# Patient Record
Sex: Male | Born: 1950
Health system: Southern US, Community
[De-identification: ages and names within clinical notes are randomized; demographics above are authoritative.]

## PROBLEM LIST (undated history)

## (undated) DIAGNOSIS — R51 Headache: Secondary | ICD-10-CM

## (undated) DIAGNOSIS — M1990 Unspecified osteoarthritis, unspecified site: Secondary | ICD-10-CM

## (undated) DIAGNOSIS — F1010 Alcohol abuse, uncomplicated: Secondary | ICD-10-CM

## (undated) DIAGNOSIS — F329 Major depressive disorder, single episode, unspecified: Secondary | ICD-10-CM

## (undated) DIAGNOSIS — I2510 Atherosclerotic heart disease of native coronary artery without angina pectoris: Secondary | ICD-10-CM

## (undated) DIAGNOSIS — Z86718 Personal history of other venous thrombosis and embolism: Secondary | ICD-10-CM

## (undated) DIAGNOSIS — I10 Essential (primary) hypertension: Secondary | ICD-10-CM

## (undated) DIAGNOSIS — I739 Peripheral vascular disease, unspecified: Secondary | ICD-10-CM

## (undated) DIAGNOSIS — H547 Unspecified visual loss: Secondary | ICD-10-CM

## (undated) DIAGNOSIS — Z952 Presence of prosthetic heart valve: Secondary | ICD-10-CM

## (undated) DIAGNOSIS — T826XXA Infection and inflammatory reaction due to cardiac valve prosthesis, initial encounter: Secondary | ICD-10-CM

## (undated) DIAGNOSIS — I729 Aneurysm of unspecified site: Secondary | ICD-10-CM

## (undated) DIAGNOSIS — I743 Embolism and thrombosis of arteries of the lower extremities: Secondary | ICD-10-CM

## (undated) DIAGNOSIS — I38 Endocarditis, valve unspecified: Secondary | ICD-10-CM

## (undated) DIAGNOSIS — Z8719 Personal history of other diseases of the digestive system: Secondary | ICD-10-CM

## (undated) HISTORY — DX: Infection and inflammatory reaction due to cardiac valve prosthesis, initial encounter: T82.6XXA

## (undated) HISTORY — PX: CARDIAC CATHETERIZATION: SHX172

## (undated) HISTORY — DX: Essential (primary) hypertension: I10

## (undated) HISTORY — DX: Aneurysm of unspecified site: I72.9

## (undated) HISTORY — DX: Endocarditis, valve unspecified: I38

## (undated) HISTORY — DX: Personal history of other venous thrombosis and embolism: Z86.718

## (undated) HISTORY — DX: Unspecified visual loss: H54.7

## (undated) HISTORY — DX: Alcohol abuse, uncomplicated: F10.10

## (undated) HISTORY — DX: Presence of prosthetic heart valve: Z95.2

## (undated) HISTORY — DX: Personal history of other diseases of the digestive system: Z87.19

---
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
## 2003-09-22 HISTORY — PX: OTHER SURGICAL HISTORY: SHX169

## 2008-06-03 ENCOUNTER — Emergency Department (HOSPITAL_COMMUNITY): Admission: EM | Admit: 2008-06-03 | Discharge: 2008-06-03 | Payer: Self-pay | Admitting: Emergency Medicine

## 2008-07-04 ENCOUNTER — Emergency Department (HOSPITAL_COMMUNITY): Admission: EM | Admit: 2008-07-04 | Discharge: 2008-07-04 | Payer: Self-pay | Admitting: Emergency Medicine

## 2009-09-21 DIAGNOSIS — Z86718 Personal history of other venous thrombosis and embolism: Secondary | ICD-10-CM

## 2009-09-21 HISTORY — DX: Personal history of other venous thrombosis and embolism: Z86.718

## 2010-03-02 ENCOUNTER — Emergency Department (HOSPITAL_COMMUNITY): Admission: EM | Admit: 2010-03-02 | Discharge: 2010-03-02 | Payer: Self-pay | Admitting: Emergency Medicine

## 2010-04-17 ENCOUNTER — Inpatient Hospital Stay (HOSPITAL_COMMUNITY): Admission: EM | Admit: 2010-04-17 | Discharge: 2010-04-18 | Payer: Self-pay | Admitting: Emergency Medicine

## 2010-04-18 ENCOUNTER — Encounter (INDEPENDENT_AMBULATORY_CARE_PROVIDER_SITE_OTHER): Payer: Self-pay | Admitting: Internal Medicine

## 2010-04-21 ENCOUNTER — Ambulatory Visit: Payer: Self-pay | Admitting: Family Medicine

## 2010-04-21 ENCOUNTER — Encounter: Payer: Self-pay | Admitting: Physician Assistant

## 2010-04-21 DIAGNOSIS — I82409 Acute embolism and thrombosis of unspecified deep veins of unspecified lower extremity: Secondary | ICD-10-CM | POA: Insufficient documentation

## 2010-04-21 DIAGNOSIS — F17200 Nicotine dependence, unspecified, uncomplicated: Secondary | ICD-10-CM

## 2010-04-23 ENCOUNTER — Encounter (INDEPENDENT_AMBULATORY_CARE_PROVIDER_SITE_OTHER): Payer: Self-pay | Admitting: *Deleted

## 2010-04-24 ENCOUNTER — Encounter: Payer: Self-pay | Admitting: Physician Assistant

## 2010-04-28 ENCOUNTER — Telehealth: Payer: Self-pay | Admitting: Physician Assistant

## 2010-04-30 ENCOUNTER — Ambulatory Visit: Payer: Self-pay | Admitting: Family Medicine

## 2010-04-30 ENCOUNTER — Encounter: Payer: Self-pay | Admitting: Physician Assistant

## 2010-05-01 ENCOUNTER — Telehealth: Payer: Self-pay | Admitting: Physician Assistant

## 2010-05-02 ENCOUNTER — Encounter: Payer: Self-pay | Admitting: Physician Assistant

## 2010-05-02 LAB — CONVERTED CEMR LAB
INR: 1.35 (ref <1.50)
Prothrombin Time: 16.9 s — ABNORMAL HIGH (ref 11.6–15.2)

## 2010-05-05 ENCOUNTER — Ambulatory Visit: Payer: Self-pay | Admitting: Cardiology

## 2010-05-05 ENCOUNTER — Ambulatory Visit: Payer: Self-pay | Admitting: Physician Assistant

## 2010-05-05 LAB — CONVERTED CEMR LAB: POC INR: 1.1

## 2010-05-08 ENCOUNTER — Ambulatory Visit: Payer: Self-pay | Admitting: Cardiology

## 2010-05-08 LAB — CONVERTED CEMR LAB: POC INR: 1.6

## 2010-05-13 ENCOUNTER — Ambulatory Visit: Payer: Self-pay | Admitting: Cardiology

## 2010-05-13 DIAGNOSIS — I10 Essential (primary) hypertension: Secondary | ICD-10-CM | POA: Insufficient documentation

## 2010-05-15 ENCOUNTER — Ambulatory Visit (HOSPITAL_COMMUNITY): Admission: RE | Admit: 2010-05-15 | Discharge: 2010-05-15 | Payer: Self-pay | Admitting: Cardiology

## 2010-05-15 ENCOUNTER — Encounter: Payer: Self-pay | Admitting: Cardiology

## 2010-05-15 ENCOUNTER — Ambulatory Visit: Payer: Self-pay | Admitting: Cardiovascular Disease

## 2010-05-15 ENCOUNTER — Ambulatory Visit: Payer: Self-pay | Admitting: Cardiology

## 2010-05-15 LAB — CONVERTED CEMR LAB: POC INR: >8

## 2010-05-19 ENCOUNTER — Ambulatory Visit: Payer: Self-pay | Admitting: Cardiology

## 2010-05-22 ENCOUNTER — Ambulatory Visit: Payer: Self-pay | Admitting: Cardiology

## 2010-05-28 ENCOUNTER — Ambulatory Visit: Payer: Self-pay | Admitting: Cardiology

## 2010-05-28 LAB — CONVERTED CEMR LAB: POC INR: 2.2

## 2010-06-04 ENCOUNTER — Encounter: Payer: Self-pay | Admitting: Family Medicine

## 2010-06-06 ENCOUNTER — Telehealth (INDEPENDENT_AMBULATORY_CARE_PROVIDER_SITE_OTHER): Payer: Self-pay | Admitting: *Deleted

## 2010-06-12 ENCOUNTER — Ambulatory Visit: Payer: Self-pay | Admitting: Cardiology

## 2010-06-12 LAB — CONVERTED CEMR LAB: POC INR: 2.1

## 2010-06-13 ENCOUNTER — Encounter: Payer: Self-pay | Admitting: Cardiology

## 2010-07-02 ENCOUNTER — Ambulatory Visit: Payer: Self-pay | Admitting: Cardiology

## 2010-07-02 LAB — CONVERTED CEMR LAB: POC INR: 2.6

## 2010-07-08 ENCOUNTER — Ambulatory Visit: Payer: Self-pay | Admitting: Family Medicine

## 2010-07-08 DIAGNOSIS — K5289 Other specified noninfective gastroenteritis and colitis: Secondary | ICD-10-CM | POA: Insufficient documentation

## 2010-07-09 ENCOUNTER — Encounter: Payer: Self-pay | Admitting: Physician Assistant

## 2010-07-09 LAB — CONVERTED CEMR LAB
BUN: 5 mg/dL — ABNORMAL LOW (ref 6–23)
Casts: NONE SEEN /lpf
Chloride: 96 meq/L (ref 96–112)
Glucose, Bld: 72 mg/dL (ref 70–99)
Lymphs Abs: 2.2 10*3/uL (ref 0.7–4.0)
Monocytes Relative: 13 % — ABNORMAL HIGH (ref 3–12)
Neutro Abs: 14.3 10*3/uL — ABNORMAL HIGH (ref 1.7–7.7)
Neutrophils Relative %: 76 % (ref 43–77)
Potassium: 4.4 meq/L (ref 3.5–5.3)
Protein, ur: NEGATIVE mg/dL
RBC: 4.44 M/uL (ref 4.22–5.81)
Sodium: 129 meq/L — ABNORMAL LOW (ref 135–145)
Squamous Epithelial / LPF: NONE SEEN /lpf
Urine Glucose: 100 mg/dL — AB
WBC: 18.9 10*3/uL — ABNORMAL HIGH (ref 4.0–10.5)
pH: 6 (ref 5.0–8.0)

## 2010-07-10 ENCOUNTER — Ambulatory Visit: Payer: Self-pay | Admitting: Family Medicine

## 2010-07-10 DIAGNOSIS — J209 Acute bronchitis, unspecified: Secondary | ICD-10-CM

## 2010-07-10 DIAGNOSIS — D72829 Elevated white blood cell count, unspecified: Secondary | ICD-10-CM | POA: Insufficient documentation

## 2010-07-10 LAB — CONVERTED CEMR LAB
Bilirubin Urine: NEGATIVE
Nitrite: NEGATIVE
Protein, U semiquant: NEGATIVE
Urobilinogen, UA: 0.2
WBC Urine, dipstick: NEGATIVE

## 2010-07-30 ENCOUNTER — Ambulatory Visit: Payer: Self-pay | Admitting: Cardiology

## 2010-07-30 LAB — CONVERTED CEMR LAB: POC INR: 2.9

## 2010-08-07 ENCOUNTER — Telehealth (INDEPENDENT_AMBULATORY_CARE_PROVIDER_SITE_OTHER): Payer: Self-pay

## 2010-08-11 ENCOUNTER — Encounter: Payer: Self-pay | Admitting: Family Medicine

## 2010-08-13 ENCOUNTER — Ambulatory Visit: Payer: Self-pay | Admitting: Cardiology

## 2010-08-13 DIAGNOSIS — Z954 Presence of other heart-valve replacement: Secondary | ICD-10-CM | POA: Insufficient documentation

## 2010-08-13 DIAGNOSIS — I5031 Acute diastolic (congestive) heart failure: Secondary | ICD-10-CM

## 2010-08-13 DIAGNOSIS — M79609 Pain in unspecified limb: Secondary | ICD-10-CM

## 2010-08-15 ENCOUNTER — Ambulatory Visit (HOSPITAL_COMMUNITY): Admission: RE | Admit: 2010-08-15 | Discharge: 2010-08-15 | Payer: Self-pay | Admitting: Cardiology

## 2010-08-18 ENCOUNTER — Encounter: Payer: Self-pay | Admitting: Cardiology

## 2010-08-27 ENCOUNTER — Ambulatory Visit: Payer: Self-pay | Admitting: Cardiovascular Disease

## 2010-09-10 ENCOUNTER — Ambulatory Visit: Payer: Self-pay | Admitting: Family Medicine

## 2010-09-16 LAB — CONVERTED CEMR LAB
Basophils Absolute: 0 10*3/uL (ref 0.0–0.1)
Basophils Relative: 0 % (ref 0–1)
Eosinophils Absolute: 0.2 10*3/uL (ref 0.0–0.7)
Eosinophils Relative: 1 % (ref 0–5)
HCT: 37.5 % — ABNORMAL LOW (ref 39.0–52.0)
Hemoglobin: 12.3 g/dL — ABNORMAL LOW (ref 13.0–17.0)
MCHC: 32.8 g/dL (ref 30.0–36.0)
MCV: 86.6 fL (ref 78.0–100.0)
Monocytes Absolute: 1.7 10*3/uL — ABNORMAL HIGH (ref 0.1–1.0)
Monocytes Relative: 12 % (ref 3–12)
RBC: 4.33 M/uL (ref 4.22–5.81)
RDW: 15.1 % (ref 11.5–15.5)

## 2010-09-18 ENCOUNTER — Ambulatory Visit: Payer: Self-pay | Admitting: Cardiology

## 2010-09-21 DIAGNOSIS — I743 Embolism and thrombosis of arteries of the lower extremities: Secondary | ICD-10-CM

## 2010-09-21 HISTORY — DX: Embolism and thrombosis of arteries of the lower extremities: I74.3

## 2010-09-23 ENCOUNTER — Telehealth (INDEPENDENT_AMBULATORY_CARE_PROVIDER_SITE_OTHER): Payer: Self-pay | Admitting: *Deleted

## 2010-10-15 ENCOUNTER — Ambulatory Visit: Admission: RE | Admit: 2010-10-15 | Discharge: 2010-10-15 | Payer: Self-pay | Source: Home / Self Care

## 2010-10-21 NOTE — Progress Notes (Signed)
Summary: RX REFILL  Phone Note Call from Patient Call back at Home Phone [phone]   Caller: PT Reason for Call: Refill Medication Summary of Call: PT NEEDS RX CALLED IN HE IS OUT OF REFILLS. WARFRIN 5MG  TABS TO [facility] IN [place]. Initial call taken by: Faythe Ghee,  August 07, 2010 11:00 AM  Follow-up for Phone Call        RX faxed to [facility], pt. aware. Follow-up by: Larita Fife Via LPN,  August 07, 2010 11:11 AM    New/Updated Medications: WARFARIN SODIUM 5 MG TABS (WARFARIN SODIUM) take 1 tablet as directed by the [facility]madin Clinic Prescriptions: WARFARIN SODIUM 5 MG TABS (WARFARIN SODIUM) take 1 tablet as directed by the Coumadin Clinic  #45 x 2   Entered by:   Larita Fife Via LPN   Authorized by:   Loreli Slot, MD, [facility]   Signed by:   Larita Fife Via LPN on 09/[birth year]/1914   Method used:   Electronically to        [facility]  [address] (retail)       [address]       [place], [place]  [address]       Ph: [phone]       Fax: [phone]   RxID:   [id number]

## 2010-10-21 NOTE — Medication Information (Signed)
Summary: ccr-lr  Anticoagulant Therapy  Managed by: Vashti Hey, RN PCP: Dr. Candida Peeling MD: Dietrich Pates MD, Molly Maduro Indication 1: DVT Lab Used: [facility] [place] Site: [place] INR POC 1.6  Dietary changes: no    Health status changes: no    Bleeding/hemorrhagic complications: no    Recent/future hospitalizations: no    Any changes in medication regimen? no    Recent/future dental: no  Any missed doses?: no       Is patient compliant with meds? yes       Allergies: No Known Drug Allergies  Anticoagulation Management History:      The patient is taking warfarin and comes in today for a routine follow up visit.  Negative risk factors for bleeding include an age less than 60 years old.  The bleeding index is 'low risk'.  Negative CHADS2 values include Age > 72 years old.  His last INR was 1.35.  Anticoagulation responsible provider: Dietrich Pates MD, Molly Maduro.  INR POC: 1.6.  Cuvette Lot#: [id number].    Anticoagulation Management Assessment/Plan:      The patient's current anticoagulation dose is Warfarin sodium 5 mg tabs: as directed.  The next INR is due 05/14/2010.  Anticoagulation instructions were given to patient.  Results were reviewed/authorized by Vashti Hey, RN.  He was notified by Vashti Hey RN.         Prior Anticoagulation Instructions: INR 1.1 Pt states he ran out of coumadin and did not know Rx had been called in.  Pt to take coumadin 2 tablets (10mg ) tonight then take 1 1/2 tablets (7.5mg ) once daily.  Recheck INR on Thursday 05/08/10.  Current Anticoagulation Instructions: INR 1.6 Continue coumadin 7.5mg  once daily

## 2010-10-21 NOTE — Assessment & Plan Note (Signed)
Summary: certification   Allergies: No Known Drug Allergies   Complete Medication List: 1)  Lovenox  .[name].. 76 mg subcutaneously once daily 2)  Nicoderm Cq 21 Mg/24hr Pt24 (Nicotine) .... One patch once daily 3)  Oxycodone 5mg  Ir  .... One to two tabs by mouth every four hours as needed 4)  Warfarin Sodium 5 Mg Tabs (Warfarin sodium) .... As directed 5)  Tylenol Extra Strength 500 Mg Tabs (Acetaminophen) .... Two tabs by mouth every 6 hours as needed  Other Orders: Re-certification of Home Health [phone])

## 2010-10-21 NOTE — Letter (Signed)
Summary: [facility]  [facility]   Imported By: Faythe Ghee 06/13/2010 15:12:49  _____________________________________________________________________  External Attachment:    Type:   Image     Comment:   External Document

## 2010-10-21 NOTE — Assessment & Plan Note (Signed)
Summary: office visit   Vital Signs:  Patient profile:   60 year old male Height:      64 inches Weight:      119.50 pounds BMI:     20.59 O2 Sat:      98 % on Room air Pulse rate:   96 / minute Resp:     16 per minute BP sitting:   138 / 96  (left arm)  Vitals Entered By: Mauricia Area CMA (July 08, 2010 9:10 AM) CC: Body ache, nausea and vomiting since last week. Theraflu did not help   Primary Rohaan Durnil:  Dr. Syliva Overman  CC:  Body ache and nausea and vomiting since last week. Theraflu did not help.  History of Present Illness: Pt reports N/V/D x 1 week.  Watery diarrhea frequently during the day.  No blood.  Anything he eats or drinks comes up. Has been drinking fluids, and eating light bland food.  Urinating normally.  No dysuria.   No recent antibiotic, or travel.  No sick contacts prior to onset, but girlfriend got it after him.  Pt states he has been following up with the Coumadin clinic regularly and coumadin check is UTD.  Hx of DVT and cardiomyopathy.       Allergies (verified): No Known Drug Allergies  Past History:  Past medical history reviewed for relevance to current acute and chronic problems.  Past Medical History: Reviewed history from 05/13/2010 and no changes required. "Cardiomyopathy" - no specific information available Possible mitral valve replacement at [facility] 1970's Right common femoral deep venous thrombosis  Review of Systems General:  Denies chills and fever. ENT:  Denies earache, nasal congestion, and sore throat. CV:  Denies chest pain or discomfort and shortness of breath with exertion. Resp:  Denies cough and shortness of breath. GI:  Complains of abdominal pain, diarrhea, loss of appetite, nausea, and vomiting; denies bloody stools and dark tarry stools. MS:  Complains of muscle aches.  Physical Exam  General:  Well-developed,well-nourished,in no acute distress; alert,appropriate and cooperative throughout  examination Head:  Normocephalic and atraumatic without obvious abnormalities. No apparent alopecia or balding. Ears:  External ear exam shows no significant lesions or deformities.  Otoscopic examination reveals clear canals, tympanic membranes are intact bilaterally without bulging, retraction, inflammation or discharge. Hearing is grossly normal bilaterally. Nose:  External nasal examination shows no deformity or inflammation. Nasal mucosa are pink and moist without lesions or exudates. Mouth:  Oral mucosa and oropharynx without lesions or exudates.  Mouth moist Neck:  No deformities, masses, or tenderness noted. Lungs:  Normal respiratory effort, chest expands symmetrically. Lungs are clear to auscultation, no crackles or wheezes. Heart:  Normal rate and regular rhythm. S1 and S2 normal without gallop, murmur, click, rub or other extra sounds. Abdomen:  soft, no distention, no masses, no guarding, no rigidity, no rebound tenderness, and bowel sounds hyperactive.  Diffuse TTP, mild Neurologic:  alert & oriented X3, sensation intact to light touch, gait normal, and DTRs symmetrical and normal.   Skin:  turgor normal and color normal.   Cervical Nodes:  No lymphadenopathy noted Psych:  Cognition and judgment appear intact. Alert and cooperative with normal attention span and concentration. No apparent delusions, illusions, hallucinations   Impression & Recommendations:  Problem # 1:  GASTROENTERITIS (ICD-558.9) Assessment New  His updated medication list for this problem includes:    Zofran 4 Mg Tabs (Ondansetron hcl) .[name]... Take one tab every 8 hrs as needed nausea and vomiting  Imodium A-d 2 Mg Tabs (Loperamide hcl) .[name]... Take 2 tablets, then one tablet after each loose stool.  maximum of 8 tabs per 24 hrs.  Orders: T-Basic Metabolic Panel [phone]) T-CBC w/Diff [phone]) T-Urinalysis ([id number]) Zofran 1mg . injection ([id number]) Admin of Therapeutic Inj  intramuscular or  subcutaneous ([address])  Problem # 2:  DVT (ICD-453.40) Assessment: Comment Only  Complete Medication List: 1)  Warfarin Sodium 5 Mg Tabs (Warfarin sodium) .... As directed 2)  Tylenol Extra Strength 500 Mg Tabs (Acetaminophen) .... Two tabs by mouth every 6 hours as needed 3)  Zofran 4 Mg Tabs (Ondansetron hcl) .... Take one tab every 8 hrs as needed nausea and vomiting 4)  Imodium A-d 2 Mg Tabs (Loperamide hcl) .... Take 2 tablets, then one tablet after each loose stool.  maximum of 8 tabs per 24 hrs.  Patient Instructions: 1)  Follow up as planned.  Return sooner if symptoms persist or worsen. 2)  You have received a shot of Zofran to help with nausea and vomiting.  This should help for about 8 hours. 3)  I have presribed Zofran and Imodium for you. 4)  Clear fluids.  BRAT diet as tolerated. Prescriptions: IMODIUM A-D 2 MG TABS (LOPERAMIDE HCL) take 2 tablets, then one tablet after each loose stool.  Maximum of 8 tabs per 24 hrs.  #24 x 0   Entered and Authorized by:   Esperanza Sheets PA   Signed by:   Esperanza Sheets PA on 07/08/2010   Method used:   Electronically to        [facility]  [address] (retail)       [address]       [place], [place]  [address]       Ph: [phone]       Fax: [phone]   RxID:   [id number] ZOFRAN 4 MG TABS (ONDANSETRON HCL) take one tab every 8 hrs as needed nausea and vomiting  #15 x 0   Entered and Authorized by:   Esperanza Sheets PA   Signed by:   Esperanza Sheets PA on 07/08/2010   Method used:   Electronically to        [facility]  [address] (retail)       [address]       [place], [place]  [address]       Ph: [phone]       Fax: [phone]   RxID:   [id number]    Medication Administration  Injection # 1:    Medication: Zofran 1mg . injection    Diagnosis: GASTROENTERITIS (ICD-558.9)    Route: IM    Site: LUOQ gluteus    Exp Date: 12/2011    Lot #: [id number]    Mfr: novaplus    Comments: 4 mg  given    Patient tolerated injection without complications    Given by: Mauricia Area CMA (July 08, 2010 9:50 AM)  Orders Added: 1)  T-Basic Metabolic Panel [[id number]] 2)  T-CBC w/Diff [[id number]] 3)  T-Urinalysis [81003-65000] 4)  Zofran 1mg . injection [J2405] 5)  Admin of Therapeutic Inj  intramuscular or subcutaneous [[address]] 6)  Est. Patient Level III [50093]

## 2010-10-21 NOTE — Medication Information (Signed)
Summary: ccr-lr  Anticoagulant Therapy  Managed by: Vashti Hey, RN PCP: Dr. Syliva Overman Supervising MD: Dietrich Pates MD, Molly Maduro Indication 1: DVT Lab Used: [facility] [place] Site: [place] INR POC 2.9  Dietary changes: no    Health status changes: no    Bleeding/hemorrhagic complications: no    Recent/future hospitalizations: no    Any changes in medication regimen? no    Recent/future dental: no  Any missed doses?: no       Is patient compliant with meds? yes       Allergies: No Known Drug Allergies  Anticoagulation Management History:      The patient is taking warfarin and comes in today for a routine follow up visit.  Negative risk factors for bleeding include an age less than 68 years old.  The bleeding index is 'low risk'.  Negative CHADS2 values include Age > 48 years old.  His last INR was 6.40.  Anticoagulation responsible provider: Dietrich Pates MD, Molly Maduro.  INR POC: 2.9.  Cuvette Lot#: [id number].    Anticoagulation Management Assessment/Plan:      The patient's current anticoagulation dose is Warfarin sodium 5 mg tabs: as directed.  The target INR is 2.0-3.0.  The next INR is due 08/27/2010.  Anticoagulation instructions were given to patient.  Results were reviewed/authorized by Vashti Hey, RN.  He was notified by Vashti Hey RN.         Prior Anticoagulation Instructions: INR 2.6 Continue coumadin 5mg  once daily   Current Anticoagulation Instructions: INR 2.9 Continue coumadin 5mg  once daily

## 2010-10-21 NOTE — Progress Notes (Signed)
  Phone Note Outgoing Call   Summary of Call: Phone call to pt.  I noticed that he missed his appt with the Coumadin clinic last week.  He has not rescheduled.  Home health nurse not scheduled to come until this Wed 8/10.  Has not had coumadin level checked.  Pt states he has no transportation to the lab today.  Advised that we need to check his coumadin level as soon as possible. That if his level is still low he is at risk of another blood clot.  Pt voices that he understands but has no money, and no transportation.  Pt states she has an appt this week at the health clinic.  Uncertain what this is regarding. Will attempt to call home health agency to see if they can check PT/INR today & call results.  Will also reschedule appt with the Coumadin clinic. Initial call taken by: Esperanza Sheets PA,  April 28, 2010 9:08 AM  Follow-up for Phone Call        advised home health, [facility] states they are swamped this week but will try to get someone out there this week. also states mr sawatzky has several friends that should be able to get him to his appts Follow-up by: Adella Hare LPN,  April 28, 2010 10:49 AM  Additional Follow-up for Phone Call Additional follow up Details #1::        Phone call from [facility], INR 5.2.  Pt denies and bleeding, nosebleeds, etc. "Nothing has changed." Advised pt to not take his coumadin tonight or tomorrow. Discussed with pt that he will need to have his PT/INR checked again on Wed.  WIll try to get him another appt on Wed with the Coumadin Clinic. Additional Follow-up by: Esperanza Sheets PA,  April 28, 2010 2:14 PM     Appended Document:  called [facility] to have the go check INR on Wed. they will not be able to go out because he is being discharged. Patient can not get into coumadin clinic until monday 8/15 at 10am. [facility] states patient will be going to health dept tomorrow  Appended Document:  Advised pt that I discovered that he was set up with the health dept  because of his lack of insurance, and the he was scheduled with our office last week because the health dept didnt have any openings for him til tomorrow.  My understanding is that they will take over his medical care. Advised pt that he has an appt Mon 8/15 at 10 with the Coumadin clinic.

## 2010-10-21 NOTE — Medication Information (Signed)
Summary: ccr-lr  Anticoagulant Therapy  Managed by: Vashti Hey, RN PCP: Dr. Syliva Overman Supervising MD: Diona Browner MD, Remi Deter Indication 1: DVT Lab Used: [facility] [place] Site: [place] INR POC 2.1  Dietary changes: no    Health status changes: no    Bleeding/hemorrhagic complications: no    Recent/future hospitalizations: no    Any changes in medication regimen? no    Recent/future dental: no  Any missed doses?: yes     Details: missed 1 dose last Thursday  Is patient compliant with meds? yes       Allergies: No Known Drug Allergies  Anticoagulation Management History:      The patient is taking warfarin and comes in today for a routine follow up visit.  Negative risk factors for bleeding include an age less than 45 years old.  The bleeding index is 'low risk'.  Negative CHADS2 values include Age > 42 years old.  His last INR was 6.40.  Anticoagulation responsible provider: Diona Browner MD, Remi Deter.  INR POC: 2.1.  Cuvette Lot#: [id number].    Anticoagulation Management Assessment/Plan:      The patient's current anticoagulation dose is Warfarin sodium 5 mg tabs: as directed.  The target INR is 2.0-3.0.  The next INR is due 07/02/2010.  Anticoagulation instructions were given to patient.  Results were reviewed/authorized by Vashti Hey, RN.  He was notified by Vashti Hey RN.         Prior Anticoagulation Instructions: INR 2.2 Continue coumadin 5mg  once daily   Current Anticoagulation Instructions: INR 2.1 Continue coumadin 5mg  once daily

## 2010-10-21 NOTE — Medication Information (Signed)
Summary: ccr-lr  Anticoagulant Therapy  Managed by: Vashti Hey, RN PCP: Dr. Syliva Overman Supervising MD: Dietrich Pates MD, Molly Maduro Indication 1: DVT Lab Used: [facility] [place] Site: [place] INR POC 2.1  Dietary changes: no    Health status changes: no    Bleeding/hemorrhagic complications: no    Recent/future hospitalizations: no    Any changes in medication regimen? no    Recent/future dental: no  Any missed doses?: yes     Details: missed 1 dose Tuesday night    Allergies: No Known Drug Allergies  Anticoagulation Management History:      The patient is taking warfarin and comes in today for a routine follow up visit.  Negative risk factors for bleeding include an age less than 35 years old.  The bleeding index is 'low risk'.  Negative CHADS2 values include Age > 74 years old.  His last INR was 6.40.  Anticoagulation responsible Desaree Downen: Dietrich Pates MD, Molly Maduro.  INR POC: 2.1.  Cuvette Lot#: [id number].    Anticoagulation Management Assessment/Plan:      The patient's current anticoagulation dose is Warfarin sodium 5 mg tabs: as directed.  The target INR is 2.0-3.0.  The next INR is due 05/28/2010.  Anticoagulation instructions were given to patient.  Results were reviewed/authorized by Vashti Hey, RN.  He was notified by Vashti Hey RN.         Prior Anticoagulation Instructions: INR 2.5 Take coumadin 1 tablet once daily and recheck Thursday  Current Anticoagulation Instructions: INR 2.1 Continue coumadin 1 tablet once daily

## 2010-10-21 NOTE — Letter (Signed)
Summary: Appointment - Missed  [facility] [place]  [address] [place] [address]   Phone: [phone]  Fax: [phone]     April 23, 2010 MRN: [id number]   [name] [address] [place], [place]  [address]   Dear [name],  Our records indicate you missed your appointment on            04/23/10 [facility]           It is very important that we reach you to reschedule this appointment. We look forward to participating in your health care needs. Please contact us at the number listed above at your earliest convenience to reschedule this appointment.     Sincerely,    Glass blower/designer

## 2010-10-21 NOTE — Progress Notes (Signed)
  Phone Note Outgoing Call   Summary of Call: Phone call to pt.  States he did [facility] the [facility] on T[facility] for his appt.  States he was told that they dont treat this type of problem anymore and to keep his appt at the Coumadin Clinic on 8/15.  Pt is not taking any coumadin, stating that he ran out. I advised pt that we needed to get blood work today to check his coumadin level and pt states he has no ride.  I again stressed to pt the importance of monitoring and taking the medication as prescribed.  Warned pt that if his level is too high he is at risk of internal bleeding, but if it goes too low then he is at risk because of his blood clots.  Pt verbalizes understanding.  Advised pt that I am going to order his PT/INR.  That I want him to get this drawn today, but if unable to get a ride today must have this drawn tomorrow. Initial call taken by: Esperanza Sheets PA,  May 01, 2010 11:07 AM  Follow-up for Phone Call        Order PT/INR STAT. Follow-up by: Esperanza Sheets PA,  May 01, 2010 11:07 AM  Additional Follow-up for Phone Call Additional follow up Details #1::        ordered and faxed to lab Additional Follow-up by: Everitt Amber LPN,  May 01, 2010 11:15 AM

## 2010-10-21 NOTE — Assessment & Plan Note (Signed)
Summary: per jaime   Vital Signs:  Patient profile:   60 year old male Height:      64 inches Weight:      118.50 pounds BMI:     20.41 O2 Sat:      99 % on Room air Temp:     98.2 degrees F oral Pulse rate:   88 / minute Resp:     16 per minute  Vitals Entered By: Mauricia Area CMA (July 10, 2010 10:12 AM) CC: follow up. Chest feels heavy. Comments Could not hear BP   Primary Provider:  Dr. Syliva Overman  CC:  follow up. Chest feels heavy..  History of Present Illness: Pt returns today for reevaluation.  His recent blood work showed leukocytosis with left shift. He states his stomach is doing much better.  No vomiting, nausea or diarrhea since he was seen 2 days ago.  No abd pain.  BMs are nl.  No dysuria, or frequency. He admits that he has had some sinus congestion, post nasal drainage and a "nagging cough" for a few days.  the cough is mostly bothersome at night and he states that his chest and stomach muscles hurt when he coughs.  no difficulty breathing. No fever or chills.   Allergies (verified): No Known Drug Allergies  Past History:  Past medical history reviewed for relevance to current acute and chronic problems.  Past Medical History: Reviewed history from 05/13/2010 and no changes required. "Cardiomyopathy" - no specific information available Possible mitral valve replacement at [facility] 1970's Right common femoral deep venous thrombosis  Review of Systems General:  Denies chills and fever. ENT:  Complains of nasal congestion, postnasal drainage, and sinus pressure; denies earache and sore throat. CV:  Denies chest pain or discomfort and palpitations. Resp:  Complains of cough; denies shortness of breath, sputum productive, and wheezing. GI:  Denies abdominal pain, diarrhea, loss of appetite, nausea, and vomiting. GU:  Denies dysuria, incontinence, and urinary frequency.  Physical Exam  General:  Well-developed,well-nourished,in no acute distress;  alert,appropriate and cooperative throughout examination Head:  Normocephalic and atraumatic without obvious abnormalities. No apparent alopecia or balding. Ears:  External ear exam shows no significant lesions or deformities.  Otoscopic examination reveals clear canals, tympanic membranes are intact bilaterally without bulging, retraction, inflammation or discharge. Hearing is grossly normal bilaterally. Nose:  External nasal examination shows no deformity or inflammation. Nasal mucosa are pink and moist without lesions or exudates. Mouth:  Oral mucosa and oropharynx without lesions or exudates.   Neck:  No deformities, masses, or tenderness noted. Lungs:  Normal respiratory effort, chest expands symmetrically. Lungs are clear to auscultation, no crackles or wheezes. Heart:  Normal rate and regular rhythm. S1 and S2 normal without gallop, murmur, click, rub or other extra sounds. Abdomen:  Bowel sounds positive,abdomen soft and non-tender without masses, organomegaly or hernias noted. Skin:  turgor normal.  warm and dry Cervical Nodes:  No lymphadenopathy noted Psych:  Cognition and judgment appear intact. Alert and cooperative with normal attention span and concentration. No apparent delusions, illusions, hallucinations   Impression & Recommendations:  Problem # 1:  ACUTE BRONCHITIS (ICD-466.0) Assessment New  His updated medication list for this problem includes:    Doxycycline Hyclate 100 Mg Caps (Doxycycline hyclate) .[name]... Take 1 capsule two times a day x 10 days  Problem # 2:  LEUKOCYTOSIS (ICD-288.60) Assessment: New  Orders: Urinalysis (47829-56213)  Problem # 3:  GASTROENTERITIS (ICD-558.9) Assessment: Improved  The following medications were removed  from the medication list:    Zofran 4 Mg Tabs (Ondansetron hcl) .[name]... Take one tab every 8 hrs as needed nausea and vomiting    Imodium A-d 2 Mg Tabs (Loperamide hcl) .[name]... Take 2 tablets, then one tablet after each loose stool.   maximum of 8 tabs per 24 hrs.  Complete Medication List: 1)  Warfarin Sodium 5 Mg Tabs (Warfarin sodium) .... As directed 2)  Tylenol Extra Strength 500 Mg Tabs (Acetaminophen) .... Two tabs by mouth every 6 hours as needed 3)  Doxycycline Hyclate 100 Mg Caps (Doxycycline hyclate) .... Take 1 capsule two times a day x 10 days  Patient Instructions: 1)  Follow up as planned. 2)  Have blood work drawn in 2 weeks. 3)  I have prescribed an antibiotic for your congestion and cough. 4)  Return sooner if your symptoms worsen, you develop fever, etc. Prescriptions: DOXYCYCLINE HYCLATE 100 MG CAPS (DOXYCYCLINE HYCLATE) take 1 capsule two times a day x 10 days  #20 x 0   Entered and Authorized by:   Esperanza Sheets PA   Signed by:   Esperanza Sheets PA on 07/10/2010   Method used:   Electronically to        [facility]  [address] (retail)       [address]       [place], [place]  [address]       Ph: [phone]       Fax: [phone]   RxID:   [id number]    Orders Added: 1)  Urinalysis [81003-65000] 2)  Est. Patient Level III [84132]    Laboratory Results   Urine Tests  Date/Time Received: July 10, 2010 11:41 AM  Date/Time Reported: July 10, 2010 11:41 AM   Routine Urinalysis   Color: yellow Appearance: Clear Glucose: negative   (Normal Range: Negative) Bilirubin: negative   (Normal Range: Negative) Ketone: negative   (Normal Range: Negative) Spec. Gravity: <1.005   (Normal Range: 1.003-1.035) Blood: small   (Normal Range: Negative) pH: 5.5   (Normal Range: 5.0-8.0) Protein: negative   (Normal Range: Negative) Urobilinogen: 0.2   (Normal Range: 0-1) Nitrite: negative   (Normal Range: Negative) Leukocyte Esterace: negative   (Normal Range: Negative)    Comments: Mauricia Area CMA  July 10, 2010 11:42 AM

## 2010-10-21 NOTE — Letter (Signed)
Summary: [facility] PRE-OP 10/02/2003  [facility] PRE-OP 10/02/2003   Imported By: Faythe Ghee 06/13/2010 15:12:19  _____________________________________________________________________  External Attachment:    Type:   Image     Comment:   External Document

## 2010-10-21 NOTE — Progress Notes (Signed)
Summary: RX REFILL  Phone Note Call from Patient Call back at Home Phone [phone]   Caller: PT  Reason for Call: Refill Medication Summary of Call: PT LEFT MESSAGE THAT HE NEEDS HIS COUMADIN REFILLED. DID NOT GIVE CALL BACK NUMBER OR PHARMACY INFORMATION Initial call taken by: Faythe Ghee,  June 06, 2010 9:06 AM    Prescriptions: WARFARIN SODIUM 5 MG TABS (WARFARIN SODIUM) as directed  #30 x 1   Entered by:   Teressa Lower RN   Authorized by:   Loreli Slot, MD, [facility]   Signed by:   Teressa Lower RN on 06/06/2010   Method used:   Electronically to        [facility]  [address] (retail)       [address]       [place], [place]  [address]       Ph: [phone]       Fax: [phone]   RxID:   [id number]

## 2010-10-21 NOTE — Miscellaneous (Signed)
Summary: advanced home care order  advanced home care order   Imported By: Curtis Sites 04/24/2010 14:43:00  _____________________________________________________________________  External Attachment:    Type:   Image     Comment:   External Document

## 2010-10-21 NOTE — Medication Information (Signed)
Summary: ccr-lr  Anticoagulant Therapy  Managed by: Vashti Hey, RN PCP: Dr. Syliva Overman Supervising MD: Daleen Squibb MD, Maisie Fus Indication 1: DVT Lab Used: [facility] [place] Site: [place] PT 55.9 INR POC >8.0  Dietary changes: yes       Details: Admits to drinking Beer frequently since LOV  Counseled against ETOH intake while taking coumadin  Health status changes: no    Bleeding/hemorrhagic complications: no    Recent/future hospitalizations: no    Any changes in medication regimen? no    Recent/future dental: no  Any missed doses?: no       Is patient compliant with meds? yes       Allergies: No Known Drug Allergies  Anticoagulation Management History:      The patient is taking warfarin and comes in today for a routine follow up visit.  Negative risk factors for bleeding include an age less than 47 years old.  The bleeding index is 'low risk'.  Negative CHADS2 values include Age > 59 years old.  His last INR was 1.35 and today's INR is 6.40.  Prothrombin time is 55.9.  Anticoagulation responsible provider: Daleen Squibb MD, Maisie Fus.  INR POC: >8.0.  Cuvette Lot#: [id number].    Anticoagulation Management Assessment/Plan:      The patient's current anticoagulation dose is Warfarin sodium 5 mg tabs: as directed.  The next INR is due 05/19/2010.  Anticoagulation instructions were given to patient.  Results were reviewed/authorized by Vashti Hey, RN.  He was notified by Vashti Hey RN.         Prior Anticoagulation Instructions: INR 1.6 Continue coumadin 7.5mg  once daily   Current Anticoagulation Instructions: INR POC >8.0 Sent to [facility] for veinapuncture  PT 55.9  INR 6.40 Pt told to hold coumadin until Monday 05/19/10 and he will come to office for INR check that morning.  Bleeding precautions discussed with pt.  He verbalized understanding.

## 2010-10-21 NOTE — Letter (Signed)
Summary: medical release  medical release   Imported By: Lind Guest 08/22/2010 11:32:42  _____________________________________________________________________  External Attachment:    Type:   Image     Comment:   External Document

## 2010-10-21 NOTE — Miscellaneous (Signed)
Summary: Home Care Report  Home Care Report   Imported By: Lind Guest 06/05/2010 09:03:33  _____________________________________________________________________  External Attachment:    Type:   Image     Comment:   External Document

## 2010-10-21 NOTE — Medication Information (Signed)
Summary: ccr-lr  Anticoagulant Therapy  Managed by: Vashti Hey, RN PCP: Dr. Syliva Overman Supervising MD: Eden Emms MD, Theron Arista Indication 1: DVT Lab Used: [facility] [place] Site: [place] INR POC 3.7  Dietary changes: no    Health status changes: yes       Details: arthritis and gout in foot/leg  Bleeding/hemorrhagic complications: no    Recent/future hospitalizations: no    Any changes in medication regimen? yes       Details: has been taking advil for pain  Recent/future dental: no  Any missed doses?: no       Is patient compliant with meds? yes       Allergies: No Known Drug Allergies  Anticoagulation Management History:      The patient is taking warfarin and comes in today for a routine follow up visit.  Negative risk factors for bleeding include an age less than 81 years old.  The bleeding index is 'low risk'.  Positive CHADS2 values include History of CHF.  Negative CHADS2 values include Age > 73 years old.  His last INR was 6.40.  Anticoagulation responsible provider: Eden Emms MD, Theron Arista.  INR POC: 3.7.  Cuvette Lot#: [id number].    Anticoagulation Management Assessment/Plan:      The patient's current anticoagulation dose is Warfarin sodium 5 mg tabs: take 1 tablet as directed by the Coumadin Clinic.  The target INR is 2.0-3.0.  The next INR is due 09/18/2010.  Anticoagulation instructions were given to patient.  Results were reviewed/authorized by Vashti Hey, RN.  He was notified by Vashti Hey RN.         Prior Anticoagulation Instructions: INR 2.9 Continue coumadin 5mg  once daily   Current Anticoagulation Instructions: INR 3.7 Hold coumadin tonight then resume 5mg  once daily

## 2010-10-21 NOTE — Letter (Signed)
Summary: [facility] 09/25/2003  [facility] 09/25/2003   Imported By: Faythe Ghee 06/13/2010 15:11:16  _____________________________________________________________________  External Attachment:    Type:   Image     Comment:   External Document

## 2010-10-21 NOTE — Medication Information (Signed)
Summary: ccr-lr  Anticoagulant Therapy  Managed by: Vashti Hey, RN PCP: Dr. Syliva Overman Supervising MD: Dietrich Pates MD, Molly Maduro Indication 1: DVT Lab Used: [facility] [place] Site: [place] INR POC 2.2  Dietary changes: no    Health status changes: no    Bleeding/hemorrhagic complications: no    Recent/future hospitalizations: no    Any changes in medication regimen? no    Recent/future dental: no  Any missed doses?: no       Is patient compliant with meds? yes       Allergies: No Known Drug Allergies  Anticoagulation Management History:      The patient is taking warfarin and comes in today for a routine follow up visit.  Negative risk factors for bleeding include an age less than 65 years old.  The bleeding index is 'low risk'.  Negative CHADS2 values include Age > 47 years old.  His last INR was 6.40.  Anticoagulation responsible provider: Dietrich Pates MD, Molly Maduro.  INR POC: 2.2.  Cuvette Lot#: [id number].    Anticoagulation Management Assessment/Plan:      The patient's current anticoagulation dose is Warfarin sodium 5 mg tabs: as directed.  The target INR is 2.0-3.0.  The next INR is due 06/12/2010.  Anticoagulation instructions were given to patient.  Results were reviewed/authorized by Vashti Hey, RN.  He was notified by Vashti Hey RN.         Prior Anticoagulation Instructions: INR 2.1 Continue coumadin 1 tablet once daily   Current Anticoagulation Instructions: INR 2.2 Continue coumadin 5mg  once daily

## 2010-10-21 NOTE — Letter (Signed)
Summary: [facility] 10/01/2003  [facility] 10/01/2003   Imported By: Faythe Ghee 06/13/2010 15:14:01  _____________________________________________________________________  External Attachment:    Type:   Image     Comment:   External Document

## 2010-10-21 NOTE — Assessment & Plan Note (Signed)
Summary: NP6 NEW TO COUMADIN IN OUR OFFICE   Visit Type:  New patient visit Primary Provider:  Dr. Syliva Overman   History of Present Illness: 60 year old male to establish followup in light of Coumadin therapy. Records indicate that he was admitted to the hospital in July with evidence of a right common femoral deep venous thrombosis. He reportedly had no regular medical followup. He was treated with Lovenox during initiation of Coumadin, and is now established in our Coumadin clinic with primary care per Dr. Lodema Hong.  A hypercoagulable workup was sent by the hospitalist service during his brief hospital stay. Prothrombin II gene mutation was negative, Cardiolipin antibodies were all low, beta-2 glycoprotein antibodies were all low, homocystine in normal range, Lupus anticoagulant was detected.  Interestingly, Mr. Grizzell states that he underwent valve replacement surgery at [facility] back in the 1970s. No details are as yet available. He states he had a "pig valve," possibly in the mitral position. It is not clear whether he underwent any bypasses at that time. No recent chest x-ray to confirm valve position. He does have a midline sternotomy scar.  Reports NYHA class II dyspnea on exertion, no chest pain or palpitations. No bleeding problems on Coumadin.    Current Medications (verified): 1)  Warfarin Sodium 5 Mg Tabs (Warfarin Sodium) .... As Directed 2)  Tylenol Extra Strength 500 Mg Tabs (Acetaminophen) .... Two Tabs By Mouth Every 6 Hours As Needed 3)  Oxycodone Hcl 10 Mg Tabs (Oxycodone Hcl) .... Use As Needed  Allergies (verified): No Known Drug Allergies  Past History:  Family History: Last updated: May 27, 2010 Mother deceased- DM Father deceased - Cirrosis of liver One sister living - presumed healthy  Social History: Last updated: 2010-05-27 Employed as needed [employer] Separated Two grown children Stopped smoking last week Alcohol use - regularly up to a  sixpack per day Drug use - no Regular exercise - no  Past Medical History: "Cardiomyopathy" - no specific information available Possible mitral valve replacement at [facility] 1970's Right common femoral deep venous thrombosis  Past Surgical History: Possible mitral valve replacement 1971  Family History: Mother deceased- DM Father deceased - Cirrosis of liver One sister living - presumed healthy  Social History: 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         No significant pain or swelling in the right lower extremity. Otherwise reviewed and negative except as outlined.  Vital Signs:  Patient profile:   60 year old male Height:      64 inches Weight:      117 pounds Pulse rate:   77 / minute BP sitting:   141 / 93  (right arm)  Vitals Entered By: Dreama Saa, CNA (27-May-2010 8:53 AM)  Physical Exam  Additional Exam:  Somewhat thin male, no acute distress. HEENT: Conjunctiva and lids normal, oropharynx with poor dentition. Neck: Supple, no elevated JVP or obvious bruits, no thyromegaly. Lungs: Clear with coarse breath sounds, nonlabored. Thorax: Healed midline sternal incision. Cardiac: PMI is indistinct, regular rate and rhythm, midsystolic high-pitched click, very soft systolic murmur, no diastolic murmur, no S3 or pericardial rub. Abdomen: Soft, nontender, bowel sounds present. Extremities: No residual edema, distal pulses one plus. Skin: Warm  and dry, no rashes. Musculoskeletal: No kyphosis. Neuropsychiatric: Alert and oriented x3, affect grossly appropriate.   Venous Doppler  Procedure date:  04/17/2010  Findings:      IMPRESSION:   Deep venous  thrombosis with blood clot in the right common femoral   vein and stagnant flow below this level.    Superficial venous thrombosis in the mid thigh greater saphenous   vein.  EKG  Procedure date:  05/13/2010  Findings:      Sinus rhythm at 72 beats per minute with leftward axis and small R. prime in leads V1 and V2. Nonspecific ST changes with decreased anterior R-wave progression and T wave inversions.  Impression & Recommendations:  Problem # 1:  DVT (ICD-453.40)  Recent diagnosis of right common femoral deep venous thrombosis, now followed through our Coumadin clinic. No bleeding problems. Symptomatically stable with no residual right leg pain or swelling. Hypercoagulable workup was essentially normal with the exception of a detectable lupus anticoagulant. Will look into how this impacts duration of Coumadin therapy.  Orders: 2-D Echocardiogram (2D Echo)  Problem # 2:  HEART VALVE REPLACEMENT, HX OF (ICD-V15.1)  Reported history of what sounds like a bioprosthetic mitral valve replacement in the 1970s and [facility]. No details as yet are available. Records are being requested and a followup 2-D echocardiogram will be obtained.  Orders: 2-D Echocardiogram (2D Echo)  Problem # 3:  NICOTINE ADDICTION (ICD-305.1)  Smoking cessation was discussed.  Problem # 4:  INCREASED BLOOD PRESSURE (ICD-796.2)  No clearly defined history of hypertension. Need to watch this. May ultimately require medical therapy.  Patient Instructions: 1)  Your physician recommends that you schedule a follow-up appointment in: 3 months 2)  Your physician recommends that you continue on your current medications as directed. Please refer to the Current Medication list given to you today. 3)  Your physician has requested that you have an echocardiogram.  Echocardiography is a painless test that uses sound waves to create images of your heart. It provides your doctor with information about the size and shape of your  heart and how well your heart's chambers and valves are working.  This procedure takes approximately one hour. There are no restrictions for this procedure.  Appended Document: NP6 NEW TO COUMADIN IN OUR OFFICE Reviewed some data on Lupus anticoagulant in the setting of DVT as it regards duration of Coumadin therapy. No definitive recommendation is noted, although "long term" is suggested. At this point will plan at least 6 months of therapy, rechecking a lupus anticoagulant along the way. If this is negative, then may consider stopping Coumadin in 6 months, and at that time check an antithrombin III, not checked originally. Of note, the patient was also negative for factor V Leiden mutation and had normal protein C and protein S levels.  Appended Document: NP6 NEW TO COUMADIN IN OUR OFFICE Received records from [facility]. Patient was diagnosed with congestive heart failure associated with severe aortic regurgitation and cardiomyopathy, previous LVEF of 35%. Cardiac catheterization performed in December 2004 demonstrated moderate nonobstructive CAD, specifically 50% mid LAD, 20% proximal LAD, and 50% second diagonal. He underwent surgery in January 2005, including an aortic root reconstruction with a valve conduit, and replacement of the aortic valve with a 29 mm Medtronic Freestyle stentless porcine bioprosthesis. Records scanned into EMR.

## 2010-10-21 NOTE — Assessment & Plan Note (Signed)
Summary: new patient- room 3   Vital Signs:  Patient profile:   60 year old male Height:      63.5 inches Weight:      118 pounds BMI:     20.65 O2 Sat:      99 % on Room air Pulse rate:   68 / minute Resp:     16 per minute BP sitting:   132 / 84  (left arm)  Vitals Entered By: Adella Hare LPN (April 21, 2010 9:57 AM) CC: new patient- dvt right leg Is Patient Diabetic? No Pain Assessment Patient in pain? yes     Location: right calf Intensity: 10 Type: sharp Onset of pain  With activity   CC:  new patient- dvt right leg.  History of Present Illness: New pt here to establish care with new PCP. Was inpt last week with Rt femoral DVT. He was discharged home.  Had home health nurse check his PT/INR this wkend but he does not know the readings.  He was not told to make any changes to his coumadin dosing and the home health nurse is giving his Lovenox.  He does not know which home health agency he is using.  Pain in Rt calf, throbbing. Worse with increased activity.  Pain is better though than when presented to ER.  No swelling.  No PCP or Cardiologist x yrs. Pt is wearing nicotine patch. States he is doing well with smoking cessation.       Current Medications (verified): 1)  Lovenox .[name].. 76 Mg Subcutaneously Once Daily 2)  Nicoderm Cq 21 Mg/24hr Pt24 (Nicotine) .... One Patch Once Daily 3)  Oxycodone 5mg  Ir .... One To Two Tabs By Mouth Every Four Hours As Needed 4)  Warfarin Sodium 5 Mg Tabs (Warfarin Sodium) .... As Directed 5)  Tylenol Extra Strength 500 Mg Tabs (Acetaminophen) .... Two Tabs By Mouth Every 6 Hours As Needed  Allergies (verified): No Known Drug Allergies  Past History:  Past medical, surgical, family and social histories (including risk factors) reviewed, and no changes noted (except as noted below).  Past Medical History: Femoral DVT 7/11 Nicotine dependence Alcohol dependence  Past Surgical History: Heart valve replacement  1971  Family History: Reviewed history and no changes required. Mother deceased- DM Father deceased- Cirrosis of liver One sister living- presumed healthy  Social History: Reviewed history and no changes required. 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 Resp:  Denies shortness of breath.  Physical Exam  General:  Well-developed,well-nourished,in no acute distress; alert,appropriate and cooperative throughout examination Head:  Normocephalic and atraumatic without obvious abnormalities. No apparent alopecia or balding. Ears:  External ear exam shows no significant lesions or deformities.  Otoscopic examination reveals clear canals, tympanic membranes are intact bilaterally without bulging, retraction, inflammation or discharge. Hearing is grossly normal bilaterally. Nose:  External nasal examination shows no deformity or inflammation. Nasal mucosa are pink and moist without lesions or exudates. Mouth:  Oral mucosa and oropharynx without lesions or exudates.  teeth missing.   Neck:  No deformities, masses, or tenderness noted. Lungs:  Normal respiratory effort, chest expands symmetrically. Lungs are clear to auscultation, no crackles or wheezes. Heart:  Normal rate and regular rhythm. S1 and S2 normal without gallop, murmur, click, rub or other extra sounds.  Pulses:  R posterior tibial normal and R dorsalis pedis normal.   Extremities:  RLE:  No edema, or erythema.  TTP Rt calf. Neurologic:  alert & oriented X3, sensation intact to light touch, gait normal, and DTRs symmetrical and normal.   Skin:  Intact without suspicious lesions or rashes Cervical Nodes:  No lymphadenopathy noted Psych:  Cognition and judgment appear intact. Alert and cooperative with  normal attention span and concentration. No apparent delusions, illusions, hallucinations   Impression & Recommendations:  Problem # 1:  DVT (ICD-453.40) Assessment New Rt Femoral. Increased Coumadin dose from 5 mg to 7.5mg  daily.  Will notifiy home health agency to continue Lovenox x 4 additional days. If unable to get into Coumadin clinic in the next couple of days, will order a PT/INR for Wed 04-23-10.  Orders: [facility] Referral (Coumadin clinic)  Problem # 2:  HEART VALVE REPLACEMENT, HX OF (ICD-V15.1) Assessment: Comment Only  Orders: Cardiology Referral (Cardiology)  Problem # 3:  NICOTINE ADDICTION (ICD-305.1) Assessment: Improved Pt is trying to quit.  currently doing well with Nicotine patches.  His updated medication list for this problem includes:    Nicoderm Cq 21 Mg/24hr Pt24 (Nicotine) ..... One patch once daily  Complete Medication List: 1)  Lovenox  .[name].. 76 mg subcutaneously once daily 2)  Nicoderm Cq 21 Mg/24hr Pt24 (Nicotine) .... One patch once daily 3)  Oxycodone 5mg  Ir  .... One to two tabs by mouth every four hours as needed 4)  Warfarin Sodium 5 Mg Tabs (Warfarin sodium) .... As directed 5)  Tylenol Extra Strength 500 Mg Tabs (Acetaminophen) .... Two tabs by mouth every 6 hours as needed  Patient Instructions: 1)  Please schedule a follow-up appointment in 2 weeks. 2)  I have referred you to the Coumadin Clinic and for a Cardiology consult. 3)  INCREASE YOUR COUMADIN FROM 5MG  TO 7.5 MG .  YOU WILL TAKE 1 1/2 TABS OF THE 5 MG. patient has appt at Coumadin Clinic Aug 3 at 2:45 patient is aware [facility] also aware coumadin will be done at Coumadin Clinic and is aware of new orders

## 2010-10-21 NOTE — Miscellaneous (Signed)
Summary: Home Care Report  Home Care Report   Imported By: Lind Guest 04/30/2010 10:51:08  _____________________________________________________________________  External Attachment:    Type:   Image     Comment:   External Document

## 2010-10-21 NOTE — Assessment & Plan Note (Signed)
Summary: ***3 mth f/u per checkout on 05/13/10/tg   Visit Type:  Follow-up Primary Provider:  Dr. Syliva Overman   History of Present Illness: 60 year old male presents for followup. He continues followup in our Coumadin clinic with diagnosis of DVT back in July, noted below. He reports compliance with his Coumadin, last INR was therapeutic, and does not indicate any bleeding problems.  I reviewed some data on Lupus anticoagulant in the setting of DVT as it regards duration of Coumadin therapy. No definitive recommendation is noted, although "long term" is suggested. At this point will plan at least 6 months of therapy, rechecking a lupus anticoagulant along the way. If this is negative, then may consider stopping Coumadin in 6 months, and at that time check an antithrombin III, not checked originally. Of note, the patient was also negative for factor V Leiden mutation and had normal protein C and protein S levels.  He reports no right leg pain, however has noticed in the last few weeks some left leg pain and swelling at times, usually towards the end of the day. No progressive shortness of breath, cough, or hemoptysis.  I reviewed his echocardiogram. LVEF is actually better than previously noted in 2005.  Preventive Screening-Counseling & Management  Alcohol-Tobacco     Smoking Status: current  Current Medications (verified): 1)  Warfarin Sodium 5 Mg Tabs (Warfarin Sodium) .... Take 1 Tablet As Directed By The [facility] 2)  Tylenol Extra Strength 500 Mg Tabs (Acetaminophen) .... 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  Smoking Status:  current  Review of Systems       The patient complains of dyspnea on exertion and peripheral edema.  The patient denies anorexia, fever, weight loss, chest pain, syncope, prolonged cough, headaches, hemoptysis, melena, and hematochezia.         Otherwise reviewed and negative except as outlined above.  Vital Signs:  Patient profile:   60 year old male Height:      64 inches Weight:      118 pounds O2 Sat:      98 % on Room air Pulse rate:   75 / minute Pulse rhythm:   regular BP sitting:   145 / 89  (left arm)  Vitals Entered By: Teressa Lower RN (August 13, 2010 8:10 AM)  O2 Flow:  Room air   Physical Exam  Additional Exam:  Somewhat thin male, no acute distress. HEENT: Conjunctiva and lids normal, oropharynx with poor dentition. Neck: Supple, no elevated JVP or obvious bruits, no thyromegaly. Lungs: Clear with coarse breath sounds, nonlabored. Thorax: Healed midline sternal incision. Cardiac: PMI is indistinct, regular rate and rhythm, midsystolic high-pitched click, very  soft systolic murmur, no diastolic murmur, no S3 or pericardial rub. Abdomen: Soft, nontender, bowel sounds present. Extremities: No residual right-sided edema, distal pulses one plus. Trace left-sided ankle edema. No obvious cords. Skin: Warm and dry, no rashes. Musculoskeletal: No kyphosis. Neuropsychiatric: Alert and oriented x3, affect grossly  appropriate.   Echocardiogram  Procedure date:  05/15/2010  Findings:      Study Conclusions            - Left ventricle: Inferior and septal hypokinesis The cavity size       was mildly dilated. Wall thickness was increased in a pattern of       mild LVH. The estimated ejection fraction was 45%.     - Left atrium: The atrium was mildly dilated.     - Right atrium: The atrium was mildly dilated.     - Atrial septum: No defect or patent foramen ovale was identified.  No mention of bioprosthetic aortic valve, described as a moderately calcified valve.  EKG  Procedure date:  08/13/2010  Findings:      Sinus rhythm at 70 beats per minutes with sinus arrhythmia.  Impression & Recommendations:  Problem # 1:  DVT (ICD-453.40)  Patient continues on Coumadin with history of right common femoral DVT diagnosed back in July, also associated with positive lupus anticoagulant, although other thrombophilia screening looked good. Plan to continue Coumadin at this point. I would like to recheck a lupus anticoagulant. If it is negative, we might be able to consider stopping Coumadin sometime in January, and at that point check an antithrombin III level. Otherwise he will likely need to continue on Coumadin indefinitely. Followup scheduled for 3 months.  Orders: T- * Misc. Laboratory test [id number]) Venous Duplex Lower Extremity (Venous Dup Lower E)  Problem # 2:  LEG PAIN, LEFT (ICD-729.5)  New complaints over the last few weeks. Also some swelling on the side. Plan to check venous Doppler on this side to exclude presence of new thrombus. He only had evidence of right-sided disease in July.  Problem # 3:  INCREASED BLOOD PRESSURE (ICD-796.2)  Blood pressure elevated today. I expect he will need medical therapy for this. Continue regular follow up with primary care. An ACE inhibitor would be a good choice if tolerated in light of his mild left ventricular dysfunction at baseline.  Problem # 4:   NICOTINE ADDICTION (ICD-305.1)  Patient states he smokes "one cigarette a day." We have discussed complete smoking cessation. He has had a hard time quitting completely.  Problem # 5:  AORTIC VALVE REPLACEMENT, HX OF (ICD-V43.3)  Records reviewed revealing evidence of previous bioprosthetic aortic valve replacement in 2005 at [facility] related to severe aortic insufficiency. Recent echocardiogram reviewed. Overall stable clinically.  Patient Instructions: 1)  Your physician recommends that you schedule a follow-up appointment in: 3 MONTHS 2)  Your physician recommends that you return for lab work [id number] 3)  Your physician has requested that you have a lower or upper extremity venous duplex.  This test is an ultrasound of the veins in the legs or arms.  It looks at venous blood flow that carries blood from the heart to the legs or arms.  Allow one hour for a Lower Venous exam.  Allow thirty minutes for an Upper Venous exam. There are no restrictions or special instructions.

## 2010-10-21 NOTE — Medication Information (Signed)
Summary: ccr-lr  Anticoagulant Therapy  Managed by: Vashti Hey, RN PCP: Dr. Syliva Overman Supervising MD: Dietrich Pates MD, Molly Maduro Indication 1: DVT Lab Used: [facility] [place] Site: [place] INR POC 2.5  Dietary changes: yes    Health status changes: no    Bleeding/hemorrhagic complications: no    Recent/future hospitalizations: no    Any changes in medication regimen? no    Recent/future dental: no  Any missed doses?: yes     Details: coumadin has been on hold since Thursday    Allergies: No Known Drug Allergies  Anticoagulation Management History:      The patient is taking warfarin and comes in today for a routine follow up visit.  Negative risk factors for bleeding include an age less than 57 years old.  The bleeding index is 'low risk'.  Negative CHADS2 values include Age > 75 years old.  His last INR was 6.40.  Anticoagulation responsible provider: Dietrich Pates MD, Molly Maduro.  INR POC: 2.5.  Cuvette Lot#: [id number].    Anticoagulation Management Assessment/Plan:      The patient's current anticoagulation dose is Warfarin sodium 5 mg tabs: as directed.  The next INR is due 05/22/2010.  Anticoagulation instructions were given to patient.  Results were reviewed/authorized by Vashti Hey, RN.         Prior Anticoagulation Instructions: INR POC >8.0 Sent to [facility] for veinapuncture  PT 55.9  INR 6.40 Pt told to hold coumadin until Monday 05/19/10 and he will come to office for INR check that morning.  Bleeding precautions discussed with pt.  He verbalized understanding.  Current Anticoagulation Instructions: INR 2.5 Take coumadin 1 tablet once daily and recheck Thursday

## 2010-10-21 NOTE — Assessment & Plan Note (Signed)
Summary: follow up 2 week/slj   Vital Signs:  Patient profile:   60 year old male Height:      63.5 inches Weight:      118 pounds BMI:     20.65 O2 Sat:      98 % on Room air Pulse rate:   76 / minute BP sitting:   124 / 72  (left arm) CC: has seen coumadin clinic today, not sure why he is here nothing is bothering him. room 2   CC:  has seen coumadin clinic today and not sure why he is here nothing is bothering him. room 2.  History of Present Illness: Pt presents today for f/u. He is accompanied by his girlfriend. He states he did go to the [facility], but was told that they dont manage this sort of problem so he has no more appts to f/u with them. He did go to the Coumadin clinic this am.  He had not started his coumadin.  States he will pick up the prescription today and start it tonight. He states he is overall doing and feeling well.  He is working a part time job at [employer]. Just works when they need him and call him.  States he is smoking about 4-5 cigs per day and is aware he needs to quit. He has run out of the nicotine patches and cannot afford to buy any. Pt states he previously drank daily.  He now will have 1-2 beers occasionally.    Pt denies HA, dizziness, chest pain, pressure or difficulty breathing. He states that his leg pain is much better and no swelling.   Allergies: No Known Drug Allergies  Past History:  Past medical history reviewed for relevance to current acute and chronic problems.  Past Medical History: Reviewed history from 04/21/2010 and no changes required. Femoral DVT 7/11 Nicotine dependence Alcohol dependence  Review of Systems CV:  Denies chest pain or discomfort, lightheadness, palpitations, and swelling of feet. Resp:  Denies shortness of breath. Neuro:  Denies headaches.  Physical Exam  General:  Well-developed,well-nourished,in no acute distress; alert,appropriate and cooperative throughout examination Head:   Normocephalic and atraumatic without obvious abnormalities. No apparent alopecia or balding. Ears:  External ear exam shows no significant lesions or deformities.  Otoscopic examination reveals clear canals, tympanic membranes are intact bilaterally without bulging, retraction, inflammation or discharge. Hearing is grossly normal bilaterally. Nose:  External nasal examination shows no deformity or inflammation. Nasal mucosa are pink and moist without lesions or exudates. Mouth:  Oral mucosa and oropharynx without lesions or exudates.   Neck:  No deformities, masses, or tenderness noted. Lungs:  Normal respiratory effort, chest expands symmetrically. Lungs are clear to auscultation, no crackles or wheezes. Heart:  Normal rate and regular rhythm. S1 and S2 normal without gallop, murmur, click, rub or other extra sounds. Extremities:  RLE:  No edema and nontender Cervical Nodes:  No lymphadenopathy noted Psych:  Cognition and judgment appear intact. Alert and cooperative with normal attention span and concentration. No apparent delusions, illusions, hallucinations   Impression & Recommendations:  Problem # 1:  DVT (ICD-453.40) Assessment Comment Only Again discussed with pt the importance of compliance with Coumadin and regular monitoring.  Discussed with pt the risks of coumadin level being too low and too high, including stroke, internal bleeding, and death. Pt and his girlfriend verbalize understanding of this.  Problem # 2:  NICOTINE ADDICTION (ICD-305.1) Assessment: Comment Only  The following medications  were removed from the medication list:    Nicoderm Cq 21 Mg/24hr Pt24 (Nicotine) ..... One patch once daily  Complete Medication List: 1)  Warfarin Sodium 5 Mg Tabs (Warfarin sodium) .... As directed 2)  Tylenol Extra Strength 500 Mg Tabs (Acetaminophen) .... Two tabs by mouth every 6 hours as needed  Patient Instructions: 1)  Please schedule a follow-up appointment in 2 months. 2)   Continue going to the Coumadin clinic as they advise. 3)  Take your Coumadin every day. 4)  Keep your appt with the cardiologist (heart dr) on 8/23 at 9:20 am.  This is in the same office as the Coumadin clinic. 5)  Tobacco is very bad for your health and your loved ones! You Should stop smoking!. 6)  Stop Smoking Tips: Choose a Quit date. Cut down before the Quit date. decide what you will do as a substitute when you feel the urge to smoke(gum,toothpick,exercise). 7)  Try to abstain from alcohol.

## 2010-10-21 NOTE — Medication Information (Signed)
Summary: ccr-lr  Anticoagulant Therapy  Managed by: Vashti Hey, RN PCP: Dr. Syliva Overman Supervising MD: Dietrich Pates MD, Molly Maduro Indication 1: DVT Lab Used: [facility] [place] Site: [place] INR POC 2.6  Dietary changes: no    Health status changes: no    Bleeding/hemorrhagic complications: no    Recent/future hospitalizations: no    Any changes in medication regimen? no    Recent/future dental: no  Any missed doses?: no       Is patient compliant with meds? yes       Allergies: No Known Drug Allergies  Anticoagulation Management History:      The patient is taking warfarin and comes in today for a routine follow up visit.  Negative risk factors for bleeding include an age less than 52 years old.  The bleeding index is 'low risk'.  Negative CHADS2 values include Age > 79 years old.  His last INR was 6.40.  Anticoagulation responsible provider: Dietrich Pates MD, Molly Maduro.  INR POC: 2.6.  Cuvette Lot#: [id number].    Anticoagulation Management Assessment/Plan:      The patient's current anticoagulation dose is Warfarin sodium 5 mg tabs: as directed.  The target INR is 2.0-3.0.  The next INR is due 07/30/2010.  Anticoagulation instructions were given to patient.  Results were reviewed/authorized by Vashti Hey, RN.  He was notified by Vashti Hey RN.         Prior Anticoagulation Instructions: INR 2.1 Continue coumadin 5mg  once daily   Current Anticoagulation Instructions: INR 2.6 Continue coumadin 5mg  once daily

## 2010-10-21 NOTE — Medication Information (Signed)
Summary: COUM/RM  Anticoagulant Therapy  Managed by: Vashti Hey, RN PCP: Dr. Candida Peeling MD: Daleen Squibb MD, Maisie Fus Indication 1: DVT Lab Used: [facility] [place] Site: [place] INR POC 1.1  Dietary changes: no    Health status changes: no    Bleeding/hemorrhagic complications: no    Recent/future hospitalizations: yes       Details: Recent DVT  Any changes in medication regimen? yes       Details: was on started on coumadin for DVT  Has not been compliant with meds or keeping INR appts thus far  Recent/future dental: no  Any missed doses?: yes     Details: pt ran out off coumadin and has not taken any since last week inspite of new instructions given by Esperanza Sheets PA  Is patient compliant with meds? no      Comments: INR's per Dr Anthony Sar office 04/28/10  INR 5.2  Ccumadin held x 2 days 05/02/10  INR 1.35  Told to take 10mg  x 1 then resume 7.5mg  once daily but pt did not take.  States he ran out of med.  Allergies: No Known Drug Allergies  Anticoagulation Management History:      The patient comes in today for his initial visit for anticoagulation therapy.  Negative risk factors for bleeding include an age less than 58 years old.  The bleeding index is 'low risk'.  Negative CHADS2 values include Age > 25 years old.  His last INR was 1.35.  Anticoagulation responsible provider: Daleen Squibb MD, Maisie Fus.  INR POC: 1.1.  Cuvette Lot#: [id number].    Anticoagulation Management Assessment/Plan:      The patient's current anticoagulation dose is Warfarin sodium 5 mg tabs: as directed.  The next INR is due 05/08/2010.  Anticoagulation instructions were given to patient.  Results were reviewed/authorized by Vashti Hey, RN.  He was notified by Vashti Hey RN.         Current Anticoagulation Instructions: INR 1.1 Pt states he ran out of coumadin and did not know Rx had been called in.  Pt to take coumadin 2 tablets (10mg ) tonight then take 1 1/2 tablets (7.5mg ) once daily.   Recheck INR on Thursday 05/08/10.

## 2010-10-21 NOTE — Letter (Signed)
Summary: [facility] [employer] [facility], agricultural at [facility]  [address] [place] [address]   Phone: [phone]  Fax: [phone]      August 18, 2010 MRN: [id number]   [name] [address] [place], [place]  [address]   Dear Mr. Eakle,  Your test ordered by Selena Batten has been reviewed by your physician (or physician assistant) and was found to be normal or stable. Your physician (or physician assistant) felt no changes were needed at this time.  ____ Echocardiogram  ____ Cardiac Stress Test  __X__ Lab Work  __X__ Peripheral vascular study of arms, legs or neck  ____ CT scan or X-ray  ____ Lung or Breathing test  ____ Other:   Thank you.   Nona Dell, MD, F.A.C.C

## 2010-10-23 NOTE — Progress Notes (Signed)
Summary: rx refill  Phone Note Call from Patient Call back at Home Phone [phone]   Caller: pt Reason for Call: Refill Medication, Talk to Nurse Summary of Call: pt called on monday and left message that he is out of his blood clot medication and needs more called in. Initial call taken by: Faythe Ghee,  September 23, 2010 8:54 AM    Prescriptions: WARFARIN SODIUM 5 MG TABS (WARFARIN SODIUM) take 1 tablet as directed by the Coumadin Clinic  #45 x 2   Entered by:   Teressa Lower RN   Authorized by:   Loreli Slot, MD, [facility]   Signed by:   Teressa Lower RN on 09/23/2010   Method used:   Electronically to        [facility]  [address] (retail)       [address]       [place], [place]  [address]       Ph: [phone]       Fax: [phone]   RxID:   [id number]

## 2010-10-23 NOTE — Assessment & Plan Note (Signed)
Summary: follow up 2 month/slj   Vital Signs:  Patient profile:   60 year old male Height:      64 inches Weight:      122.75 pounds BMI:     21.15 O2 Sat:      97 % on Room air Pulse rate:   85 / minute Pulse rhythm:   regular Resp:     16 per minute BP sitting:   132 / 90  (left arm)  Vitals Entered By: Adella Hare LPN (September 10, 2010 9:15 AM)  O2 Flow:  Room air CC: follow-up visit Is Patient Diabetic? No   Primary Care Provider:  Dr. Syliva Overman  CC:  follow-up visit.  History of Present Illness: Pt in forf/u. He is noted to have leukocytosis, with o specific symptoms of localised infecrion. he denies any current fever or recent fever or chils. he denies head or chest congestion , or dental pain.  he does have prosthetic heart valves. He denies abdomminal pain, nausea,vomitting , diarreah or bloating.  Preventive Screening-Counseling & Management  Alcohol-Tobacco     Smoking Cessation Counseling: yes  Current Medications (verified): 1)  Warfarin Sodium 5 Mg Tabs (Warfarin Sodium) .... Take 1 Tablet As Directed By [facility] 2)  Tylenol Extra Strength 500 Mg Tabs (Acetaminophen) .... Two Tabs By Mouth Every 6 Hours As Needed  Allergies (verified): No Known Drug Allergies  Past History:  Past medical, surgical, family and social histories (including risk factors) reviewed for relevance to current acute and chronic problems.  Past Medical History: Reviewed history from 08/13/2010 and no changes required. History of cardiomyopathy, LVEF 35% 2005 -  Right common femoral deep venous thrombosis Positive lupus anticoagulant Aortic Insufficiency - severe, status post bioprosthetic AVR 2005 [facility]) Cardiac catheterization 12/04 - 50% mid LAD, 50% second diagonal  Past Surgical History: Reviewed history from 08/13/2010 and no changes required. Aortic valve replacement, 29 mm Medtronic Freestyle stentless porcine bioprosthesis 1/05 [facility])  Family  History: Reviewed history from 05/13/2010 and no changes required. Mother deceased- DM Father deceased - Cirrosis of liver One sister living - presumed healthy  Social History: Reviewed history from 08/13/2010 and no changes required. 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   Review of Systems      See HPI General:  Denies chills and fever. Eyes:  Denies discharge, eye pain, and red eye. ENT:  Denies nasal congestion, postnasal drainage, sinus pressure, and sore throat. CV:  Denies chest pain or discomfort, difficulty breathing while lying down, palpitations, and swelling of feet. Resp:  Denies cough and shortness of breath. GI:  Denies abdominal pain, constipation, diarrhea, nausea, and vomiting. GU:  Denies dysuria and urinary frequency. MS:  Denies joint pain and stiffness. Endo:  Denies cold intolerance, excessive hunger, excessive thirst, and heat intolerance. Heme:  Denies abnormal bruising and bleeding. Allergy:  Denies hives or rash and itching eyes.  Physical Exam  General:  Well-developed,well-nourished,in no acute distress; alert,appropriate and cooperative throughout examination HEENT: No facial asymmetry,  EOMI, No sinus tenderness, TM's Clear, oropharynx  pink and moist.   Chest: Clear to auscultation bilaterally.  CVS: S1, S2,systolic , No S3.   Abd: Soft, Nontender.  MS: Adequate ROM spine, hips, shoulders and knees.  Ext: No edema.   CNS: CN 2-12 intact, power tone and sensation normal throughout.  Skin: Intact, no visible lesions or rashes.  Psych: Good eye contact, normal affect.  Memory intact, not anxious or depressed appearing.]    Impression & Recommendations:  Problem # 1:  LEUKOCYTOSIS (ICD-288.60) Assessment Comment Only  Orders: T-CBC w/Diff (16109-60454), if persits following antibiotics will have card  evaluate the pt to eval valves, currently , no systemic symtoms to suggest acute illness  Problem # 2:  NICOTINE ADDICTION (ICD-305.1) Assessment: Comment Only  Encouraged smoking cessation and discussed different methods for smoking cessation.   Problem # 3:  DVT (ICD-453.40)  Complete Medication List: 1)  Warfarin Sodium 5 Mg Tabs (Warfarin sodium) .... Take 1 tablet as directed by the coumadin clinic 2)  Tylenol Extra Strength 500 Mg Tabs (Acetaminophen) .... Two tabs by mouth every 6 hours as needed 3)  Septra Ds 800-160 Mg Tabs (Sulfamethoxazole-trimethoprim) .... Take 1 tablet by mouth two times a day  Patient Instructions: 1)  Please schedule a follow-up appointment in 4 months. 2)  CBC w/ Diff prior to visit, ICD-9: today, dx leukocytosis 3)  Tobacco is very bad for your health and your loved ones! You Should stop smoking!. 4)  It is not healthy  for men to drink more than 2-3 drinks per day or for women to drink more than 1-2 drinks per day. Prescriptions: SEPTRA DS 800-160 MG TABS (SULFAMETHOXAZOLE-TRIMETHOPRIM) Take 1 tablet by mouth two times a day  #20 x 0   Entered and Authorized by:   Syliva Overman MD   Signed by:   Syliva Overman MD on 09/11/2010   Method used:   Historical   RxID:   [id number]    Orders Added: 1)  Est. Patient Level III [30865] 2)  T-CBC w/Diff [[id number]]

## 2010-10-23 NOTE — Medication Information (Signed)
Summary: ccr-lr  Anticoagulant Therapy  Managed by: Vashti Hey, RN PCP: Dr. Syliva Overman Supervising MD: Dietrich Pates MD, Molly Maduro Indication 1: DVT Lab Used: [facility] [place] [facility] Site: [place] INR POC 1.9  Dietary changes: no    Health status changes: no    Bleeding/hemorrhagic complications: no    Recent/future hospitalizations: no    Any changes in medication regimen? no    Recent/future dental: no  Any missed doses?: no       Is patient compliant with meds? yes       Allergies: No Known Drug Allergies  Anticoagulation Management History:      The patient is taking warfarin and comes in today for a routine follow up visit.  Negative risk factors for bleeding include an age less than 60 years old.  The bleeding index is 'low risk'.  Positive CHADS2 values include History of CHF.  Negative CHADS2 values include Age > 23 years old.  His last INR was 6.40.  Anticoagulation responsible provider: Dietrich Pates MD, Molly Maduro.  INR POC: 1.9.  Cuvette Lot#: [id number].    Anticoagulation Management Assessment/Plan:      The patient's current anticoagulation dose is Warfarin sodium 5 mg tabs: take 1 tablet as directed by [facility].  The target INR is 2.0-3.0.  The next INR is due 10/15/2010.  Anticoagulation instructions were given to patient.  Results were reviewed/authorized by Vashti Hey, RN.  He was notified by Vashti Hey RN.         Prior Anticoagulation Instructions: INR 3.7 Hold coumadin tonight then resume 5mg  once daily   Current Anticoagulation Instructions: INR 1.9 Take coumadin 1 1/2 tablets tonight then resume 1 tablet once daily

## 2010-10-23 NOTE — Medication Information (Signed)
Summary: ccr-lr  Anticoagulant Therapy  Managed by: Vashti Hey, RN PCP: Dr. Syliva Overman Supervising MD: Diona Browner MD, Remi Deter Indication 1: DVT Lab Used: [facility] [place] Site: [place] INR POC 2.1  Dietary changes: no    Health status changes: no    Bleeding/hemorrhagic complications: no    Recent/future hospitalizations: no    Any changes in medication regimen? no    Recent/future dental: no  Any missed doses?: no       Is patient compliant with meds? yes       Allergies: No Known Drug Allergies  Anticoagulation Management History:      The patient is taking warfarin and comes in today for a routine follow up visit.  Negative risk factors for bleeding include an age less than 12 years old.  The bleeding index is 'low risk'.  Positive CHADS2 values include History of CHF.  Negative CHADS2 values include Age > 43 years old.  His last INR was 6.40.  Anticoagulation responsible provider: Diona Browner MD, Remi Deter.  INR POC: 2.1.  Cuvette Lot#: [id number].    Anticoagulation Management Assessment/Plan:      The patient's current anticoagulation dose is Warfarin sodium 5 mg tabs: take 1 tablet as directed by [facility].  The target INR is 2.0-3.0.  The next INR is due 11/12/2010.  Anticoagulation instructions were given to patient.  Results were reviewed/authorized by Vashti Hey, RN.  He was notified by Vashti Hey RN.         Prior Anticoagulation Instructions: INR 1.9 Take coumadin 1 1/2 tablets tonight then resume 1 tablet once daily   Current Anticoagulation Instructions: INR 2.1 Continue coumadin 5mg  once daily

## 2010-10-27 ENCOUNTER — Emergency Department (HOSPITAL_COMMUNITY): Payer: Medicaid Other

## 2010-10-27 ENCOUNTER — Inpatient Hospital Stay (HOSPITAL_COMMUNITY)
Admission: AD | Admit: 2010-10-27 | Discharge: 2010-11-13 | DRG: 237 | Disposition: A | Discharge status: Skilled Nursing Facility | Payer: Medicaid Other | Source: Other Acute Inpatient Hospital | Attending: Internal Medicine | Admitting: Internal Medicine

## 2010-10-27 ENCOUNTER — Inpatient Hospital Stay (HOSPITAL_COMMUNITY)
Admission: EM | Admit: 2010-10-27 | Discharge: 2010-10-27 | Disposition: A | Discharge status: Other Acute Inpatient Hospital | Payer: Medicaid Other | Source: Home / Self Care

## 2010-10-27 DIAGNOSIS — I428 Other cardiomyopathies: Secondary | ICD-10-CM | POA: Diagnosis present

## 2010-10-27 DIAGNOSIS — D72829 Elevated white blood cell count, unspecified: Secondary | ICD-10-CM | POA: Diagnosis not present

## 2010-10-27 DIAGNOSIS — R7401 Elevation of levels of liver transaminase levels: Secondary | ICD-10-CM | POA: Diagnosis present

## 2010-10-27 DIAGNOSIS — R1013 Epigastric pain: Secondary | ICD-10-CM | POA: Diagnosis present

## 2010-10-27 DIAGNOSIS — R7402 Elevation of levels of lactic acid dehydrogenase (LDH): Secondary | ICD-10-CM | POA: Diagnosis present

## 2010-10-27 DIAGNOSIS — D62 Acute posthemorrhagic anemia: Secondary | ICD-10-CM | POA: Diagnosis present

## 2010-10-27 DIAGNOSIS — I728 Aneurysm of other specified arteries: Principal | ICD-10-CM | POA: Diagnosis present

## 2010-10-27 DIAGNOSIS — K838 Other specified diseases of biliary tract: Secondary | ICD-10-CM | POA: Diagnosis present

## 2010-10-27 DIAGNOSIS — R319 Hematuria, unspecified: Secondary | ICD-10-CM | POA: Diagnosis not present

## 2010-10-27 DIAGNOSIS — D7389 Other diseases of spleen: Secondary | ICD-10-CM | POA: Diagnosis present

## 2010-10-27 DIAGNOSIS — Z23 Encounter for immunization: Secondary | ICD-10-CM

## 2010-10-27 DIAGNOSIS — K7200 Acute and subacute hepatic failure without coma: Secondary | ICD-10-CM | POA: Diagnosis present

## 2010-10-27 DIAGNOSIS — T45515A Adverse effect of anticoagulants, initial encounter: Secondary | ICD-10-CM | POA: Diagnosis present

## 2010-10-27 DIAGNOSIS — K763 Infarction of liver: Secondary | ICD-10-CM | POA: Diagnosis present

## 2010-10-27 DIAGNOSIS — K921 Melena: Secondary | ICD-10-CM | POA: Diagnosis present

## 2010-10-27 DIAGNOSIS — F17200 Nicotine dependence, unspecified, uncomplicated: Secondary | ICD-10-CM | POA: Diagnosis present

## 2010-10-27 DIAGNOSIS — I38 Endocarditis, valve unspecified: Secondary | ICD-10-CM | POA: Diagnosis present

## 2010-10-27 DIAGNOSIS — R791 Abnormal coagulation profile: Secondary | ICD-10-CM | POA: Diagnosis present

## 2010-10-27 DIAGNOSIS — F1010 Alcohol abuse, uncomplicated: Secondary | ICD-10-CM | POA: Diagnosis present

## 2010-10-27 DIAGNOSIS — Z952 Presence of prosthetic heart valve: Secondary | ICD-10-CM

## 2010-10-27 DIAGNOSIS — Z86718 Personal history of other venous thrombosis and embolism: Secondary | ICD-10-CM

## 2010-10-27 DIAGNOSIS — Z5900 Homelessness unspecified: Secondary | ICD-10-CM

## 2010-10-27 DIAGNOSIS — Z7901 Long term (current) use of anticoagulants: Secondary | ICD-10-CM

## 2010-10-27 DIAGNOSIS — E876 Hypokalemia: Secondary | ICD-10-CM | POA: Diagnosis not present

## 2010-10-27 DIAGNOSIS — R7309 Other abnormal glucose: Secondary | ICD-10-CM | POA: Diagnosis present

## 2010-10-27 DIAGNOSIS — N2889 Other specified disorders of kidney and ureter: Secondary | ICD-10-CM | POA: Diagnosis present

## 2010-10-27 LAB — DIFFERENTIAL
Basophils Relative: 0 % (ref 0–1)
Eosinophils Absolute: 0 10*3/uL (ref 0.0–0.7)
Lymphs Abs: 0.7 10*3/uL (ref 0.7–4.0)
Monocytes Absolute: 1.2 10*3/uL — ABNORMAL HIGH (ref 0.1–1.0)
Monocytes Relative: 6 % (ref 3–12)
Neutrophils Relative %: 90 % — ABNORMAL HIGH (ref 43–77)

## 2010-10-27 LAB — CBC
Hemoglobin: 5.6 g/dL — CL (ref 13.0–17.0)
MCH: 30.4 pg (ref 26.0–34.0)
MCHC: 34.5 g/dL (ref 30.0–36.0)
MCV: 88.1 fL (ref 78.0–100.0)
Platelets: 297 10*3/uL (ref 150–400)
RBC: 3.62 MIL/uL — ABNORMAL LOW (ref 4.22–5.81)
RBC: 1.86 MIL/uL — ABNORMAL LOW (ref 4.22–5.81)

## 2010-10-27 LAB — PROLACTIN: Prolactin: 6.9 ng/mL (ref 2.1–17.1)

## 2010-10-27 LAB — COMPREHENSIVE METABOLIC PANEL
Albumin: 3 g/dL — ABNORMAL LOW (ref 3.5–5.2)
BUN: 13 mg/dL (ref 6–23)
CO2: 26 mEq/L (ref 19–32)
Calcium: 8.2 mg/dL — ABNORMAL LOW (ref 8.4–10.5)
Chloride: 101 mEq/L (ref 96–112)
Creatinine, Ser: 0.74 mg/dL (ref 0.4–1.5)
GFR calc Af Amer: >60 mL/min (ref >60)
GFR calc non Af Amer: >60 mL/min (ref >60)
Total Bilirubin: 1.2 mg/dL (ref 0.3–1.2)

## 2010-10-27 LAB — URINE MICROSCOPIC-ADD ON

## 2010-10-27 LAB — TYPE AND SCREEN: ABO/RH(D): O POS

## 2010-10-27 LAB — RAPID URINE DRUG SCREEN, HOSP PERFORMED
Opiates: NOT DETECTED
Tetrahydrocannabinol: POSITIVE — AB

## 2010-10-27 LAB — GLUCOSE, CAPILLARY

## 2010-10-27 LAB — HEMOGLOBIN AND HEMATOCRIT, BLOOD
HCT: 16.2 % — ABNORMAL LOW (ref 39.0–52.0)
Hemoglobin: 5.5 g/dL — CL (ref 13.0–17.0)

## 2010-10-27 LAB — PROTIME-INR: INR: 2.09 — ABNORMAL HIGH (ref 0.00–1.49)

## 2010-10-27 LAB — APTT: aPTT: 27 seconds (ref 24–37)

## 2010-10-27 LAB — URINALYSIS, ROUTINE W REFLEX MICROSCOPIC
Leukocytes, UA: NEGATIVE
Nitrite: NEGATIVE
Specific Gravity, Urine: 1.02 (ref 1.005–1.030)
pH: 6 (ref 5.0–8.0)

## 2010-10-27 LAB — LACTIC ACID, PLASMA: Lactic Acid, Venous: 1.2 mmol/L (ref 0.5–2.2)

## 2010-10-27 MED ORDER — IOHEXOL 300 MG/ML  SOLN: 100.0000 mL | Freq: Once | INTRAMUSCULAR | Status: DC | PRN

## 2010-10-28 ENCOUNTER — Inpatient Hospital Stay (HOSPITAL_COMMUNITY): Payer: Medicaid Other

## 2010-10-28 ENCOUNTER — Encounter: Payer: Self-pay | Admitting: Internal Medicine

## 2010-10-28 DIAGNOSIS — R932 Abnormal findings on diagnostic imaging of liver and biliary tract: Secondary | ICD-10-CM

## 2010-10-28 DIAGNOSIS — D500 Iron deficiency anemia secondary to blood loss (chronic): Secondary | ICD-10-CM

## 2010-10-28 DIAGNOSIS — K921 Melena: Secondary | ICD-10-CM

## 2010-10-28 DIAGNOSIS — I330 Acute and subacute infective endocarditis: Secondary | ICD-10-CM

## 2010-10-28 DIAGNOSIS — R740 Nonspecific elevation of levels of transaminase and lactic acid dehydrogenase [LDH]: Secondary | ICD-10-CM

## 2010-10-28 DIAGNOSIS — I2690 Septic pulmonary embolism without acute cor pulmonale: Secondary | ICD-10-CM

## 2010-10-28 DIAGNOSIS — K922 Gastrointestinal hemorrhage, unspecified: Secondary | ICD-10-CM

## 2010-10-28 DIAGNOSIS — D689 Coagulation defect, unspecified: Secondary | ICD-10-CM

## 2010-10-28 LAB — COMPREHENSIVE METABOLIC PANEL
AST: 374 U/L — ABNORMAL HIGH (ref 0–37)
Albumin: 2.3 g/dL — ABNORMAL LOW (ref 3.5–5.2)
Alkaline Phosphatase: 260 U/L — ABNORMAL HIGH (ref 39–117)
BUN: 16 mg/dL (ref 6–23)
CO2: 23 mEq/L (ref 19–32)
Chloride: 112 mEq/L (ref 96–112)
Creatinine, Ser: 0.73 mg/dL (ref 0.4–1.5)
GFR calc non Af Amer: >60 mL/min (ref >60)
Potassium: 3.9 mEq/L (ref 3.5–5.1)
Total Bilirubin: 1.3 mg/dL — ABNORMAL HIGH (ref 0.3–1.2)

## 2010-10-28 LAB — GLUCOSE, CAPILLARY
Glucose-Capillary: 134 mg/dL — ABNORMAL HIGH (ref 70–99)
Glucose-Capillary: 121 mg/dL — ABNORMAL HIGH (ref 70–99)
Glucose-Capillary: 122 mg/dL — ABNORMAL HIGH (ref 70–99)

## 2010-10-28 LAB — POCT I-STAT 3, ART BLOOD GAS (G3+)
O2 Saturation: 95 %
TCO2: 25 mmol/L (ref 0–100)
pCO2 arterial: 36.4 mmHg (ref 35.0–45.0)
pH, Arterial: 7.416 (ref 7.350–7.450)
pO2, Arterial: 71 mmHg — ABNORMAL LOW (ref 80.0–100.0)

## 2010-10-28 LAB — CBC
HCT: 20.7 % — ABNORMAL LOW (ref 39.0–52.0)
Hemoglobin: 7.3 g/dL — ABNORMAL LOW (ref 13.0–17.0)
MCH: 29.4 pg (ref 26.0–34.0)
MCHC: 35.3 g/dL (ref 30.0–36.0)
MCV: 83.5 fL (ref 78.0–100.0)
RBC: 2.48 MIL/uL — ABNORMAL LOW (ref 4.22–5.81)

## 2010-10-28 LAB — HEMOGLOBIN AND HEMATOCRIT, BLOOD
HCT: 17.3 % — ABNORMAL LOW (ref 39.0–52.0)
HCT: 24.5 % — ABNORMAL LOW (ref 39.0–52.0)
HCT: 25.7 % — ABNORMAL LOW (ref 39.0–52.0)
Hemoglobin: 6 g/dL — CL (ref 13.0–17.0)
Hemoglobin: 8.6 g/dL — ABNORMAL LOW (ref 13.0–17.0)
Hemoglobin: 9 g/dL — ABNORMAL LOW (ref 13.0–17.0)

## 2010-10-28 LAB — PREPARE FRESH FROZEN PLASMA

## 2010-10-28 LAB — BASIC METABOLIC PANEL
BUN: 12 mg/dL (ref 6–23)
CO2: 25 mEq/L (ref 19–32)
Calcium: 7.1 mg/dL — ABNORMAL LOW (ref 8.4–10.5)
Chloride: 111 mEq/L (ref 96–112)
Creatinine, Ser: 0.83 mg/dL (ref 0.4–1.5)

## 2010-10-28 LAB — APTT: aPTT: 27 seconds (ref 24–37)

## 2010-10-28 LAB — PREPARE RBC (CROSSMATCH)

## 2010-10-28 MED ORDER — IOHEXOL 300 MG/ML  SOLN: 150.0000 mL | Freq: Once | INTRAMUSCULAR | Status: AC | PRN

## 2010-10-29 ENCOUNTER — Inpatient Hospital Stay (HOSPITAL_COMMUNITY): Payer: Medicaid Other

## 2010-10-29 ENCOUNTER — Encounter (INDEPENDENT_AMBULATORY_CARE_PROVIDER_SITE_OTHER): Payer: Self-pay | Admitting: *Deleted

## 2010-10-29 DIAGNOSIS — R7881 Bacteremia: Secondary | ICD-10-CM

## 2010-10-29 DIAGNOSIS — I728 Aneurysm of other specified arteries: Secondary | ICD-10-CM

## 2010-10-29 DIAGNOSIS — K56609 Unspecified intestinal obstruction, unspecified as to partial versus complete obstruction: Secondary | ICD-10-CM

## 2010-10-29 LAB — HEMOGLOBIN AND HEMATOCRIT, BLOOD: Hemoglobin: 8.7 g/dL — ABNORMAL LOW (ref 13.0–17.0)

## 2010-10-29 LAB — DIC (DISSEMINATED INTRAVASCULAR COAGULATION)PANEL
INR: 2.84 — ABNORMAL HIGH (ref 0.00–1.49)
Smear Review: NONE SEEN
aPTT: 39 seconds — ABNORMAL HIGH (ref 24–37)

## 2010-10-29 LAB — CBC
HCT: 18.7 % — ABNORMAL LOW (ref 39.0–52.0)
Hemoglobin: 6.8 g/dL — CL (ref 13.0–17.0)
MCH: 30.5 pg (ref 26.0–34.0)
MCH: 29.9 pg (ref 26.0–34.0)
MCH: 28.8 pg (ref 26.0–34.0)
MCHC: 36.4 g/dL — ABNORMAL HIGH (ref 30.0–36.0)
MCHC: 34.5 g/dL (ref 30.0–36.0)
MCV: 83.9 fL (ref 78.0–100.0)
MCV: 83.1 fL (ref 78.0–100.0)
MCV: 83.3 fL (ref 78.0–100.0)
Platelets: 134 10*3/uL — ABNORMAL LOW (ref 150–400)
Platelets: 150 10*3/uL (ref 150–400)
RBC: 2.54 MIL/uL — ABNORMAL LOW (ref 4.22–5.81)
RDW: 14.7 % (ref 11.5–15.5)
RDW: 15.3 % (ref 11.5–15.5)
RDW: 15.3 % (ref 11.5–15.5)

## 2010-10-29 LAB — PREPARE FRESH FROZEN PLASMA
Unit division: 0
Unit division: 0

## 2010-10-29 LAB — PROTIME-INR
Prothrombin Time: 31.5 seconds — ABNORMAL HIGH (ref 11.6–15.2)
Prothrombin Time: 21.1 seconds — ABNORMAL HIGH (ref 11.6–15.2)

## 2010-10-29 LAB — COMPREHENSIVE METABOLIC PANEL
ALT: 135 U/L — ABNORMAL HIGH (ref 0–53)
BUN: 10 mg/dL (ref 6–23)
CO2: 26 mEq/L (ref 19–32)
Calcium: 7.3 mg/dL — ABNORMAL LOW (ref 8.4–10.5)
Creatinine, Ser: 0.86 mg/dL (ref 0.4–1.5)
GFR calc non Af Amer: >60 mL/min (ref >60)
Glucose, Bld: 105 mg/dL — ABNORMAL HIGH (ref 70–99)
Sodium: 142 mEq/L (ref 135–145)

## 2010-10-29 LAB — GLUCOSE, CAPILLARY: Glucose-Capillary: 104 mg/dL — ABNORMAL HIGH (ref 70–99)

## 2010-10-29 LAB — HEPATITIS PANEL, ACUTE
HCV Ab: NEGATIVE
Hep A IgM: NEGATIVE
Hep B C IgM: NEGATIVE
Hepatitis B Surface Ag: NEGATIVE

## 2010-10-29 LAB — BRAIN NATRIURETIC PEPTIDE: Pro B Natriuretic peptide (BNP): 72 pg/mL (ref 0.0–100.0)

## 2010-10-29 MED ORDER — IOHEXOL 350 MG/ML SOLN
100.0000 mL | Freq: Once | INTRAVENOUS | Status: AC | PRN
  Administered 2010-10-29: 100 mL via INTRAVENOUS

## 2010-10-30 DIAGNOSIS — R740 Nonspecific elevation of levels of transaminase and lactic acid dehydrogenase [LDH]: Secondary | ICD-10-CM

## 2010-10-30 DIAGNOSIS — K922 Gastrointestinal hemorrhage, unspecified: Secondary | ICD-10-CM

## 2010-10-30 DIAGNOSIS — R932 Abnormal findings on diagnostic imaging of liver and biliary tract: Secondary | ICD-10-CM

## 2010-10-30 DIAGNOSIS — D62 Acute posthemorrhagic anemia: Secondary | ICD-10-CM

## 2010-10-30 LAB — CBC
HCT: 21.3 % — ABNORMAL LOW (ref 39.0–52.0)
HCT: 23.3 % — ABNORMAL LOW (ref 39.0–52.0)
HCT: 22.3 % — ABNORMAL LOW (ref 39.0–52.0)
Hemoglobin: 7.5 g/dL — ABNORMAL LOW (ref 13.0–17.0)
Hemoglobin: 7.7 g/dL — ABNORMAL LOW (ref 13.0–17.0)
MCH: 29 pg (ref 26.0–34.0)
MCH: 29.1 pg (ref 26.0–34.0)
MCHC: 35.2 g/dL (ref 30.0–36.0)
MCHC: 34.8 g/dL (ref 30.0–36.0)
MCHC: 34.5 g/dL (ref 30.0–36.0)
MCV: 84.2 fL (ref 78.0–100.0)
Platelets: 172 10*3/uL (ref 150–400)
RBC: 2.97 MIL/uL — ABNORMAL LOW (ref 4.22–5.81)
RBC: 2.62 MIL/uL — ABNORMAL LOW (ref 4.22–5.81)
RDW: 15.2 % (ref 11.5–15.5)
RDW: 15.4 % (ref 11.5–15.5)
RDW: 15.5 % (ref 11.5–15.5)
WBC: 16.9 10*3/uL — ABNORMAL HIGH (ref 4.0–10.5)
WBC: 14.5 10*3/uL — ABNORMAL HIGH (ref 4.0–10.5)

## 2010-10-30 LAB — PREPARE FRESH FROZEN PLASMA: Unit division: 0

## 2010-10-30 LAB — COMPREHENSIVE METABOLIC PANEL
AST: 79 U/L — ABNORMAL HIGH (ref 0–37)
Albumin: 2.2 g/dL — ABNORMAL LOW (ref 3.5–5.2)
BUN: 4 mg/dL — ABNORMAL LOW (ref 6–23)
Creatinine, Ser: 0.81 mg/dL (ref 0.4–1.5)
GFR calc Af Amer: >60 mL/min (ref >60)
Total Protein: 5 g/dL — ABNORMAL LOW (ref 6.0–8.3)

## 2010-10-30 LAB — PROTIME-INR
INR: 1.18 (ref 0.00–1.49)
Prothrombin Time: 15.2 seconds (ref 11.6–15.2)

## 2010-10-30 LAB — GLUCOSE, CAPILLARY
Glucose-Capillary: 110 mg/dL — ABNORMAL HIGH (ref 70–99)
Glucose-Capillary: 108 mg/dL — ABNORMAL HIGH (ref 70–99)
Glucose-Capillary: 168 mg/dL — ABNORMAL HIGH (ref 70–99)
Glucose-Capillary: 114 mg/dL — ABNORMAL HIGH (ref 70–99)

## 2010-10-31 ENCOUNTER — Inpatient Hospital Stay (HOSPITAL_COMMUNITY): Payer: Medicaid Other

## 2010-10-31 ENCOUNTER — Encounter: Payer: Self-pay | Admitting: Family Medicine

## 2010-10-31 LAB — CROSSMATCH
Antibody Screen: NEGATIVE
Unit division: 0
Unit division: 0
Unit division: 0
Unit division: 0

## 2010-10-31 LAB — RENAL FUNCTION PANEL
Albumin: 2.1 g/dL — ABNORMAL LOW (ref 3.5–5.2)
BUN: 3 mg/dL — ABNORMAL LOW (ref 6–23)
CO2: 26 mEq/L (ref 19–32)
Chloride: 99 mEq/L (ref 96–112)
Creatinine, Ser: 0.66 mg/dL (ref 0.4–1.5)
Glucose, Bld: 99 mg/dL (ref 70–99)
Potassium: 3.2 mEq/L — ABNORMAL LOW (ref 3.5–5.1)

## 2010-10-31 LAB — CBC
HCT: 22.2 % — ABNORMAL LOW (ref 39.0–52.0)
MCH: 29.3 pg (ref 26.0–34.0)
MCV: 85.7 fL (ref 78.0–100.0)
Platelets: 201 10*3/uL (ref 150–400)
RBC: 2.59 MIL/uL — ABNORMAL LOW (ref 4.22–5.81)

## 2010-10-31 LAB — PREPARE FRESH FROZEN PLASMA: Unit division: 0

## 2010-10-31 LAB — GLUCOSE, CAPILLARY: Glucose-Capillary: 95 mg/dL (ref 70–99)

## 2010-10-31 LAB — HEPATIC FUNCTION PANEL
ALT: 66 U/L — ABNORMAL HIGH (ref 0–53)
AST: 49 U/L — ABNORMAL HIGH (ref 0–37)
Albumin: 2 g/dL — ABNORMAL LOW (ref 3.5–5.2)
Alkaline Phosphatase: 181 U/L — ABNORMAL HIGH (ref 39–117)
Bilirubin, Direct: 0.9 mg/dL — ABNORMAL HIGH (ref 0.0–0.3)
Total Bilirubin: 2.7 mg/dL — ABNORMAL HIGH (ref 0.3–1.2)

## 2010-10-31 MED ORDER — GADOBENATE DIMEGLUMINE 529 MG/ML IV SOLN
13.0000 mL | Freq: Once | INTRAVENOUS | Status: AC
  Administered 2010-10-31: 13 mL via INTRAVENOUS

## 2010-11-01 LAB — GLUCOSE, CAPILLARY: Glucose-Capillary: 81 mg/dL (ref 70–99)

## 2010-11-01 LAB — COMPREHENSIVE METABOLIC PANEL
ALT: 52 U/L (ref 0–53)
AST: 33 U/L (ref 0–37)
Albumin: 2.1 g/dL — ABNORMAL LOW (ref 3.5–5.2)
CO2: 28 mEq/L (ref 19–32)
Calcium: 7.9 mg/dL — ABNORMAL LOW (ref 8.4–10.5)
Chloride: 97 mEq/L (ref 96–112)
Creatinine, Ser: 0.65 mg/dL (ref 0.4–1.5)
GFR calc Af Amer: >60 mL/min (ref >60)
GFR calc non Af Amer: >60 mL/min (ref >60)
Sodium: 132 mEq/L — ABNORMAL LOW (ref 135–145)
Total Bilirubin: 1.5 mg/dL — ABNORMAL HIGH (ref 0.3–1.2)

## 2010-11-01 LAB — CULTURE, BLOOD (ROUTINE X 2): Culture: NO GROWTH

## 2010-11-01 LAB — CBC
Hemoglobin: 8.3 g/dL — ABNORMAL LOW (ref 13.0–17.0)
MCH: 29.3 pg (ref 26.0–34.0)
Platelets: 263 10*3/uL (ref 150–400)
RBC: 2.83 MIL/uL — ABNORMAL LOW (ref 4.22–5.81)
WBC: 20.5 10*3/uL — ABNORMAL HIGH (ref 4.0–10.5)

## 2010-11-02 ENCOUNTER — Inpatient Hospital Stay (HOSPITAL_COMMUNITY): Payer: Medicaid Other

## 2010-11-02 LAB — COMPREHENSIVE METABOLIC PANEL
Albumin: 1.9 g/dL — ABNORMAL LOW (ref 3.5–5.2)
Alkaline Phosphatase: 253 U/L — ABNORMAL HIGH (ref 39–117)
BUN: 2 mg/dL — ABNORMAL LOW (ref 6–23)
CO2: 28 mEq/L (ref 19–32)
Chloride: 98 mEq/L (ref 96–112)
GFR calc non Af Amer: >60 mL/min (ref >60)
Glucose, Bld: 100 mg/dL — ABNORMAL HIGH (ref 70–99)
Potassium: 3.6 mEq/L (ref 3.5–5.1)
Total Bilirubin: 1.4 mg/dL — ABNORMAL HIGH (ref 0.3–1.2)

## 2010-11-02 LAB — CBC
HCT: 22 % — ABNORMAL LOW (ref 39.0–52.0)
MCHC: 34.5 g/dL (ref 30.0–36.0)
MCV: 87 fL (ref 78.0–100.0)
Platelets: 314 10*3/uL (ref 150–400)
RDW: 15.9 % — ABNORMAL HIGH (ref 11.5–15.5)

## 2010-11-02 LAB — GLUCOSE, CAPILLARY
Glucose-Capillary: 105 mg/dL — ABNORMAL HIGH (ref 70–99)
Glucose-Capillary: 112 mg/dL — ABNORMAL HIGH (ref 70–99)

## 2010-11-02 LAB — HOMOCYSTEINE: Homocysteine: 8.8 umol/L (ref 4.0–15.4)

## 2010-11-02 NOTE — Consult Note (Signed)
[name], [name]              ACCOUNT NO.:  [id number]  MEDICAL RECORD NO.:  [id number]           PATIENT TYPE:  I  LOCATION:  2306                         FACILITY:  [facility]  PHYSICIAN:  Juanetta Gosling, MDDATE OF BIRTH:  1951-01-08  DATE OF CONSULTATION:  10/27/2010 DATE OF DISCHARGE:                                CONSULTATION   CHIEF COMPLAINT:  Abdominal pain and weakness.  HISTORY OF PRESENT ILLNESS:  This a 60 year old male who has provided his report to me, he has had about 1 week of nausea, vomiting, diarrhea that he then sweats and also he has noted some bright red blood per rectum.  He thought this was related just to the flu.  He has been taking Advil for some of the pain.  The abdominal pain and discomfort has continued for this entire week as well.  He was then presented to [facility] for evaluation where he was then worked up and sent here for further evaluation given a very abnormal CT scan.  He reports to me on arrival that he has abdominal pain, continue to have some black stools.  PAST MEDICAL HISTORY:  Possible cardiomyopathy and history of a DVT in July 2011.  PAST SURGICAL HISTORY:  Some sort of heart surgery in 1971 as he is unclear about exactly what they did.  He has wires on his chest x-ray but I cannot identify any other information about his surgery.  MEDICATIONS:  Coumadin and Advil.  ALLERGIES:  He has no known drug allergies.  SOCIAL HISTORY:  Smokes 1 pack per day.  Drinks 2-3 beers per day.  His latest CBC shows a white blood cell count of 13.7, hematocrit 16.2, platelets of 222, an urinalysis done earlier in today shows trace ketones, moderate blood, but on micro he only has 0-2 red cells.  His urine drug screen shows this positive for THC.  His lactic acid at 10:12 this morning was 1.2.  His lipase was 22.  His PTT was 27.  His INR was 2.09.  His Protime was 23.6.  CMP showed a glucose of 216, alkaline phosphatase 338, AST 501, ALT  141, albumin of 3 and a calcium of 8.2. CBC earlier in today showed his hematocrit to be 31.9.  CT scan which I have reviewed and as well as read the report shows him to have multiple areas that may have emboli present in both kidneys as well as his spleen.  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  ASSESSMENT: 1. Gastrointestinal bleed of unknown etiology. 2. Abdominal pain.  PLAN: 1. He needs aggressive resuscitation with blood products.  Right now I     recommend give him 4 units of packed red blood cells, checking his     coags and reversing him to a normal PT and a normal  INR. 2. Evaluation for possible embolic source.  This could be from his     heart or it could be from one of these aneurysms potentially. 3. Evaluation for source of his GI bleed.  It would be uncommon to     have a source of a GI bleed being his hepatic artery aneurysm to     his gallbladder.  I think it would be reasonable for a     Gastroenterology consult and discussed an upper endoscopy to see if     there is another source given his Coumadin as well as his history     of melena as well as his use of Advil.  This would also be able to     determine if there is any blood coming out of his ampulla and if he     does have hemobilia.  I also would think every CTA of his abdomen     would be reasonable also to reassess these areas.  If indeed he is     determined to have hemobilia and this is coming from this     gallbladder as a possible fistulas connection, either     Interventional Radiology or Vascular Surgery consult will be in     order along with [place] to address these areas to see if an     endovascular therapy might  be reasonable.  Discussed these plans     with the primary service, also recommended critical care service to     be involved in his care.     Juanetta Gosling, MD     MCW/MEDQ  D:  10/27/2010  T:  10/28/2010  Job:  [id number]  Electronically Signed by Emelia Loron MD on 11/01/2010 03:13:39 PM

## 2010-11-03 DIAGNOSIS — D689 Coagulation defect, unspecified: Secondary | ICD-10-CM

## 2010-11-03 DIAGNOSIS — R933 Abnormal findings on diagnostic imaging of other parts of digestive tract: Secondary | ICD-10-CM

## 2010-11-03 DIAGNOSIS — R1011 Right upper quadrant pain: Secondary | ICD-10-CM

## 2010-11-03 DIAGNOSIS — K921 Melena: Secondary | ICD-10-CM

## 2010-11-03 LAB — CULTURE, BLOOD (SINGLE): Culture  Setup Time: 201202071042

## 2010-11-03 LAB — BASIC METABOLIC PANEL
Chloride: 99 mEq/L (ref 96–112)
GFR calc Af Amer: >60 mL/min (ref >60)
Potassium: 4.2 mEq/L (ref 3.5–5.1)

## 2010-11-03 LAB — CBC
MCV: 88.8 fL (ref 78.0–100.0)
Platelets: 452 10*3/uL — ABNORMAL HIGH (ref 150–400)
RBC: 2.69 MIL/uL — ABNORMAL LOW (ref 4.22–5.81)
WBC: 10.6 10*3/uL — ABNORMAL HIGH (ref 4.0–10.5)

## 2010-11-03 LAB — GLUCOSE, CAPILLARY
Glucose-Capillary: 91 mg/dL (ref 70–99)
Glucose-Capillary: 94 mg/dL (ref 70–99)

## 2010-11-03 LAB — CULTURE, BLOOD (ROUTINE X 2)
Culture  Setup Time: 201202071538
Culture: NO GROWTH

## 2010-11-03 LAB — LUPUS ANTICOAGULANT PANEL
DRVVT: 38.5 secs (ref 36.2–44.3)
PTT Lupus Anticoagulant: 45.9 secs — ABNORMAL HIGH (ref 30.0–45.6)
PTTLA 4:1 Mix: 43.5 secs (ref 30.0–45.6)

## 2010-11-03 LAB — PROTEIN C ACTIVITY: Protein C Activity: 62 % — ABNORMAL LOW (ref 75–133)

## 2010-11-03 LAB — CARDIOLIPIN ANTIBODIES, IGG, IGM, IGA: Anticardiolipin IgM: 7 MPL U/mL — ABNORMAL LOW (ref <11)

## 2010-11-04 DIAGNOSIS — B999 Unspecified infectious disease: Secondary | ICD-10-CM

## 2010-11-04 DIAGNOSIS — R933 Abnormal findings on diagnostic imaging of other parts of digestive tract: Secondary | ICD-10-CM

## 2010-11-04 DIAGNOSIS — D689 Coagulation defect, unspecified: Secondary | ICD-10-CM

## 2010-11-04 DIAGNOSIS — I330 Acute and subacute infective endocarditis: Secondary | ICD-10-CM

## 2010-11-04 DIAGNOSIS — K921 Melena: Secondary | ICD-10-CM

## 2010-11-04 DIAGNOSIS — I2690 Septic pulmonary embolism without acute cor pulmonale: Secondary | ICD-10-CM

## 2010-11-04 DIAGNOSIS — R1011 Right upper quadrant pain: Secondary | ICD-10-CM

## 2010-11-04 LAB — GLUCOSE, CAPILLARY
Glucose-Capillary: 76 mg/dL (ref 70–99)
Glucose-Capillary: 103 mg/dL — ABNORMAL HIGH (ref 70–99)

## 2010-11-04 LAB — COMPREHENSIVE METABOLIC PANEL
ALT: 31 U/L (ref 0–53)
Albumin: 2.1 g/dL — ABNORMAL LOW (ref 3.5–5.2)
Alkaline Phosphatase: 202 U/L — ABNORMAL HIGH (ref 39–117)
BUN: 5 mg/dL — ABNORMAL LOW (ref 6–23)
Chloride: 102 mEq/L (ref 96–112)
Glucose, Bld: 89 mg/dL (ref 70–99)
Potassium: 4.3 mEq/L (ref 3.5–5.1)
Total Bilirubin: 0.9 mg/dL (ref 0.3–1.2)

## 2010-11-04 LAB — CBC
HCT: 21.6 % — ABNORMAL LOW (ref 39.0–52.0)
MCV: 88.9 fL (ref 78.0–100.0)
Platelets: 493 10*3/uL — ABNORMAL HIGH (ref 150–400)
RBC: 2.43 MIL/uL — ABNORMAL LOW (ref 4.22–5.81)
WBC: 10.9 10*3/uL — ABNORMAL HIGH (ref 4.0–10.5)

## 2010-11-04 NOTE — H&P (Addendum)
[name], [name]              ACCOUNT NO.:  [id number]  MEDICAL RECORD NO.:  [id number]           PATIENT TYPE:  E  LOCATION:  [facility]                          FACILITY:  [facility]  PHYSICIAN:  Marinda Elk, M.D.DATE OF BIRTH:  [birth year]  DATE OF ADMISSION:  10/27/2010 DATE OF DISCHARGE:  LH                             HISTORY & PHYSICAL   PRIMARY CARE DOCTOR:  None.  CHIEF COMPLAINT:  Black stools and abdominal pain.  HISTORY OF PRESENT ILLNESS:  This is a 60 year old with past medical history of DVT 6 months ago, on Coumadin, unknown source, unknown cause that comes in for abdominal pain and black stools.  He relates that 1 week, he started getting abdominal discomfort.  He has black stools 1 to 2 times.  He started taking Advil and it did not improve.  He said he is dizzy upon standing.  He has had black stools since then.  Rectal exam was done by the ED that showed black stools and red stools.  Prior red blood per rectum and dark blood in rectal vault.  He relates he has some fevers.  He also has a family history, also which is not documented in the chart, he is dizzy and weak.  He is complaining of severe abdominal pain.  He relates no vomiting blood.  Some nausea, but not bad.  Some chills, but no fevers.  No sick contacts at home.  He has traveled out of the country.  ALLERGIES:  No known drug allergies.  PAST MEDICAL HISTORY: 1. Cardiomyopathy with open heart surgery in 1971.  The patient tells     me that he had valve repair. 2. History of DVT in July 2011.  FAMILY HISTORY:  The patient denies any contributory family history.  He is a poor historian.  SOCIAL HISTORY:  He smokes half a pack per day and continues to drink daily.  HOME MEDICATIONS:  Coumadin and Advil.  REVIEW OF SYSTEMS:  A 10-point review of system done negative, positive per HPI.  PHYSICAL EXAM:  VITAL SIGNS:  Temperature 96, pulse 77, blood pressure 143/105.  He was breathing 19 times  per minute, satting 96% on room air. GENERAL:  He is awake, alert and oriented x3, complaining of pain. HEENT:  Moist mucous membrane.  No JVD.  No carotid bruit.  Atraumatic, normocephalic.  Anicteric.  No jaundice. LUNGS:  Good air movement with clear to auscultation. CARDIOVASCULAR:  He has a regular rate and rhythm with positive S1 and S2.  No murmurs, rubs or gallops. ABDOMEN:  Positive bowel sounds, tender in all four quadrants, but mainly in the epigastric area with some guarding.  No rebound. EXTREMITIES:  Positive pulses, extremely thin.  No clubbing, cyanosis or edema. SKIN:  He has no rashes, ulceration. NEUROLOGIC:  Alert and oriented x4, coherent, fluent language and nonfocal.  LABS ON ADMISSION:  His lactate is 1.2, lipase is 22.  His PT is 26, INR 2.6.  His sodium is 137, potassium 4.1, chloride 101, bicarb 26, glucose of 216, BUN of 13, creatinine 0.7, bilirubin 1.1, alkaline phosphatase 133, AST 501, ALT 141 and  total protein 6.8, albumin 3.0, calcium of 8.2.  His white count of 19.6, hemoglobin of 11, platelet count 297, ANC of 17.7.  His last hemoglobin 6 months ago was 12.8.  Chest x-ray, no significant abnormalities.  No pneumoperitoneum or air on the diaphragm.  ASSESSMENT AND PLAN: 1. Abdominal pain with dark stools, most likely secondary to     gastrointestinal bleed.  The patient has a white count, but no     fever.  Lipase was normal.  We will get a CT scan of the abdomen,     pelvis is pending to rule out diverticulitis.  There is a convincing history     for gastrointestinal bleed.  We will start him on Protonix IV.  We     will check hepatitis panel.  We will put n.p.o.  He has history of     NSAIDS.  We will use morphine for pain.  We will call GI for     possible endoscopy and we will give vitamin K.  We will place two     large bolus IV, give a liter of fluid.  We will start on Protonix     drip. 2. Transaminitis with a history of alcohol that is  probably secondary     to cause.  We will give vitamin K.  Check a hepatitis panel.  He     has completed his treatment with Coumadin. Awaitting CT abdomen. 3. Leukocytosis.  We will get blood cultures and UA.  We will hold on     antibiotics at this time  until CT scan back. 4. Elevated INR probably secondary to Coumadin.  He has complete his 6     months treatment of Coumadin.  We will give him vitamin K.  We will     check a PT and INR in the morning. 5. Hyperglycemia, currently n.p.o.  We will put on sliding scale     insulin.     Marinda Elk, M.D.     AF/MEDQ  D:  10/27/2010  T:  10/27/2010  Job:  [id number]  Electronically Signed by Lambert Keto M.D. on 10/28/2010 08:08:36 AM

## 2010-11-05 LAB — CBC
MCH: 28.9 pg (ref 26.0–34.0)
MCHC: 32.3 g/dL (ref 30.0–36.0)
Platelets: 597 10*3/uL — ABNORMAL HIGH (ref 150–400)
RBC: 2.53 MIL/uL — ABNORMAL LOW (ref 4.22–5.81)
RDW: 15.5 % (ref 11.5–15.5)

## 2010-11-05 LAB — GLUCOSE, CAPILLARY
Glucose-Capillary: 74 mg/dL (ref 70–99)
Glucose-Capillary: 111 mg/dL — ABNORMAL HIGH (ref 70–99)
Glucose-Capillary: 96 mg/dL (ref 70–99)

## 2010-11-05 LAB — FACTOR 5 LEIDEN

## 2010-11-06 LAB — HEMOGLOBIN AND HEMATOCRIT, BLOOD
HCT: 24.3 % — ABNORMAL LOW (ref 39.0–52.0)
Hemoglobin: 7.8 g/dL — ABNORMAL LOW (ref 13.0–17.0)

## 2010-11-06 LAB — GLUCOSE, CAPILLARY
Glucose-Capillary: 98 mg/dL (ref 70–99)
Glucose-Capillary: 116 mg/dL — ABNORMAL HIGH (ref 70–99)
Glucose-Capillary: 122 mg/dL — ABNORMAL HIGH (ref 70–99)

## 2010-11-06 LAB — CBC
HCT: 24.2 % — ABNORMAL LOW (ref 39.0–52.0)
MCV: 91 fL (ref 78.0–100.0)
Platelets: 616 10*3/uL — ABNORMAL HIGH (ref 150–400)
RBC: 2.66 MIL/uL — ABNORMAL LOW (ref 4.22–5.81)
WBC: 12.9 10*3/uL — ABNORMAL HIGH (ref 4.0–10.5)

## 2010-11-06 LAB — URINALYSIS, MICROSCOPIC ONLY
Bilirubin Urine: NEGATIVE
Ketones, ur: NEGATIVE mg/dL
Nitrite: NEGATIVE
Protein, ur: NEGATIVE mg/dL
Specific Gravity, Urine: 1.009 (ref 1.005–1.030)
Urobilinogen, UA: 0.2 mg/dL (ref 0.0–1.0)

## 2010-11-06 LAB — BASIC METABOLIC PANEL
BUN: 7 mg/dL (ref 6–23)
Chloride: 99 mEq/L (ref 96–112)
Potassium: 4.9 mEq/L (ref 3.5–5.1)
Sodium: 135 mEq/L (ref 135–145)

## 2010-11-06 NOTE — Consult Note (Signed)
[name], WHIRLEY NO.:  [id number]  MEDICAL RECORD NO.:  [id number]           PATIENT TYPE:  I  LOCATION:  2306                         FACILITY:  [facility]  PHYSICIAN:  Fransisco Hertz, MD       DATE OF BIRTH:  1950/12/27  DATE OF CONSULTATION: DATE OF DISCHARGE:                                CONSULTATION   This consult was requested by the General Surgical service.  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  History unfortunately was mainly obtained from review of chart as the patient recently received conscious sedation for a transesophageal echocardiogram.  HISTORY OF PRESENT ILLNESS:  This is a 60 year old gentleman with prior history of aortic valve repair and also DVT that was on Coumadin, who presented to the hospital and it appears on October 27, 2010, at that point, his presentation to the ER was consistent with a GI bleed, as he was having both black stools and bright red blood per rectum  He was also symptomatic from his blood loss, and  also complaining of abdominal pain.  Since this patient's admission, he has already had endoscopy completed which demonstrated bleeding from the ampulla of Vater.  He also had a CAT scan that was completed which demonstrated pseudoaneurysms of the right hepatic artery with some concern of possible involvement with the gallbladder with one of the pseudoaneurysms.  Additionally, Interventional Radiology has already been consulted for management of the pseudoaneurysms and they were able to coil one of the lobes of the pseudoaneurysm and also coil the proximal inflow to the pseudoaneurysm.  PAST MEDICAL HISTORY: 1. Cardiomyopathy. 2. History of deep vein thrombosis. 3. Alcohol abuse.  PAST SURGICAL HISTORY:  Includes aortic valve replacement.  SOCIAL HISTORY:  From the chart, the patient noted smoking half a pack a day and also noted drinking at least five beers a day.  He  was not able to discuss with me whether he has had any history of any type of illicit drug use.  FAMILY HISTORY:  Previously in July 2011, he noted mother and father did not have any medical problems.  However, today, he is not able to really discuss with me any medical problems due to his altered mental status currently.  MEDICATIONS ON ADMISSION:  Included Coumadin and Ativan.  ALLERGIES:  HE HAD NO KNOWN DRUG ALLERGIES LISTED ON THE CHART.  REVIEW OF SYSTEMS:  As noted above, otherwise it was recorded in chart to be negative.  I cannot review this with the patient as he is altered in mental status.  PHYSICAL EXAMINATION:  VITAL SIGNS:  When I saw the patient he had a temperature of 98.4 with blood pressure 144/83, heart rate of 87, respirations were 21, satting 98% on room air. GENERAL EXAMINATION:  He was somnolent and though arousable to painful stimulus.  He fell asleep rapidly HEAD:normocephalic, atraumatic.   ENT: limited as he did not cooperate with examination, grossly his hearing appeared to be intact.  He would not open his mouth for examination.  I did not note any obvious drainage from his nares. NECK EXAM:  He had a supple neck without any obvious JVD.  No obvious lymphadenopathy. EYES:  Eyes could not be examined as he could not keep his eyes open long enough to do testing. PULMONARY EXAM:  He had good air movement with symmetric expansion.  No rales, rhonchi, or wheezing appreciated. CARDIAC EXAM:  He had a regular rate and rhythm.  Normal S1-S2.  There was a split in the tricuspid listening area.  I did not appreciate any murmurs per se or any gallops. VASCULAR EXAMINATION:  He has palpable radials, palpable brachialis. Carotids were easily palpable with no murmur.  He would not tolerate the palpation in the abdomen for the aorta due to the pain in his abdomen. He had palpable femorals.  There is no evidence of any hematoma or pseudoaneurysm at the cannulation  site or the mesenteric angiogram.  He had no palpable popliteal.  I could easily feel a left posterior tibial and dorsalis pedis, however, on the right side, I do not appreciate any pedal pulses. ABDOMINAL EXAM:  His abdomen appeared to be distended.  There were bowel sounds throughout.  He is tender throughout all quadrants, but especially in the right upper quadrant, I did not get a Murphy's sign per se.  He does not have frank peritoneal signs, but it is quite tender once again in the right upper quadrant.  He has some mild right costovertebral angle tenderness, none on the left side. MUSCULOSKELETAL EXAM:  Looking at his extremities, there appeared to be some discolorations in both hands that maybe consistent with embolic disease, however, there is no such findings in his feet.  I do not see no evidence of any ischemic changes in either feet or in the upper extremities. NEUROLOGIC EXAM:  He could not cooperate with neurologic examination, as he could maintain his level of consciousness. PSYCH EXAM:  I could not complete a psych exam due to his altered mental status. SKIN EXAM:  The extremities were as noted above.  I did not note any frank rashes elsewhere.  There is no obvious icterus in any body part. LYMPHATIC EXAM:  I did not appreciate any cervical, axillary, or inguinal lymphadenopathy.  LABORATORY DATA:  I reviewed this patient's CAT scan and also his IR images.  On looking at his CAT scan, reviewing the CT of abdomen and pelvis, I agreed that there are findings consistent with infarctions in bilateral kidneys and also in the spleen.  Additionally, there is a finding in the portal hepatis, which corresponds to the Interventional Radiology celiac angiogram as a right hepatic artery pseudoaneurysm.  I also reviewed the IR mesenteric angiogram, which was a selective of the celiac artery with super selection of the right hepatic artery.  It appears they successfully embolized  the one lobe of the pseudoaneurysm and also embolized the inflow to this pseudoaneurysmal area, and on the completion exam, there demonstrates no flow whatsoever within this aneurysm suggesting successful embolization of this right hepatic pseudoaneurysm.    I also reviewed the lab studies on this patient.  His most recent coagulations from this morning demonstrate INR 2.84 with fibrinogen of 453.  PTT of this patient was 39.  His CBC this morning was white count of 14.6, H and H of 7.6 and 21.1, platelets were 134,000.  All the blood cultures that have been performed on this gentleman which are a total of 5 at this point, and at this point, none of them have grown anything to date.  He also has  a comprehensive, some liver functions done and demonstrates a total bilirubin 3.5, alkaline phosphatase of 249, AST 166, ALT of 135, total protein is 4.0, albumin was 1.9.  MEDICAL DECISION-MAKING:  This is a 60 year old patient with known alcohol abuse, history of Coumadin use for DVT, and history of aortic valve replacement, who presents with GI bleeding.  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  In reviewing the interventional radiologist's intervention it appears there was a successful embolization at this right pseudoaneurysm.  I do not see any blush on the completion imaging while it is possible for collateral flow via the SMA with the current data available, there is no evidence of such.  I discussed the findings on exam and all of  the imaging studies with Dr. Andrey Campanile, General Surgery and we at this point agreed that there is not clear at this point the etiology for this patient's bleeding and that blindly proceeding with exploratory laparotomy would be fraught with danger.  I would agree with him at this point that I would proceed first with a CTA of chest, abdomen and pelvis with both to re-image the viscera, but also to image the aorta and also the mesenteric to look for another possible area of bleeding.  At this point,  I think the emphasis should be on aggressive resuscitation including complete correction of any coagulopathy.  It is not clear to me baseline how much this patient's liver is compromised by his alcoholism and how much of his pain he currently has is related to concurrent gallbladder disease or possible fistula from the embolized pseudoaneurysm to the gallbladder.  I also would suggest that if there is concern for continued bleeding from the pseudoaneurysm, to reconsider possible selective angiogram from both the SMA and also the celiac artery, though I suspect that at this point the CTA would be much more valuable overall diagnostic study and I would probably obtain that prior to consider any selective mesenteric angiogram.  The patient should remain in the  ICU obviously while aggressively being managed.  The Vascular Surgical Service is available for assistance with any open surgical exploration if this patient should continue to bleed and need any type of emergent evaluation intraoperatively.  Thank you for giving [place] the opportunity to participate in this patient's care.  FOLLOWUP:  We continue to follow this patient tomorrow and then Dr. Edilia Bo will be covering for me on Friday and also this weekend.     Fransisco Hertz, MD     BLC/MEDQ  D:  10/29/2010  T:  10/30/2010  Job:  [id number]  Electronically Signed by Leonides Sake MD on 11/06/2010 08:53:18 PM

## 2010-11-06 NOTE — Procedures (Signed)
Summary: Enteroscopy  ENDOSCOPY PROCEDURE REPORT  PATIENT:  [name], [name]  MR#:  [id number] BIRTHDATE:   [birth year]   GENDER:   male  ENDOSCOPIST:   Hedwig Morton. Juanda Chance, MD Referred by: Emelia Loron, M.D.  PROCEDURE DATE:  10/28/2010 PROCEDURE:  enteroscopy 40981 ASA CLASS:   Class III INDICATIONS: acute GIB, melena, CT scan suggest possibility of hemobilia, alcoholism, Coumadin reversed with FFP, abd.pain, abnl LFT's, has received 4 u PRBC's  MEDICATIONS:    Benadryl 25 mg IV, Fentanyl 100 mcg IV, Versed 6 mg IV TOPICAL ANESTHETIC:   Cetacaine Spray  DESCRIPTION OF PROCEDURE:   After the risks benefits and alternatives of the procedure were thoroughly explained, informed consent was obtained.  The Pentax EGD [id number] endoscope was introduced through the mouth and advanced to the proximal jejunum, without limitations.  The instrument was slowly withdrawn as the mucosa was fully examined. <<PROCEDUREIMAGES>>                <<OLD IMAGES>>  The upper, middle, and distal third of the esophagus were carefully inspected and no abnormalities were noted. The z-line was well seen at the GEJ. The endoscope was pushed into the fundus which was normal including a retroflexed view. The antrum,gastric body, first and second part of the duodenum were unremarkable (see image1, image8, and image11). normal esophagus ( no varices), normal stomach with moderate amount of old blood, pylorus spastic but no ulcer  The duodenal bulb was normal in appearance, as was the postbulbar duodenum (see image9, image7, and image6). bright red blood at the 2nd portion duodenum at the Papilla, no blood seen exiting brom thye ampulla but the periampullary segment had a collection of BRB  Blood was found. 100- 200 cc of old blood in the stomach, small amount of fresh blood in the duodenum  normal otherwise (see image5, image4, and image3). normal proximal jejunum to 130cm    Retroflexed views revealed no abnormalities.    The  scope was then withdrawn from the patient and the procedure completed.  COMPLICATIONS:   None  ENDOSCOPIC IMPRESSION:  1) Normal EGD  2) Normal duodenum  3) Blood  4) Normal  no visible acute lesions from esophagus to proximal jejunum, but there is an evidence of fresh blood repeatedly appearing at the periampullary area of the duodenum  the findings fresh blood as well as lack of findings elswhere are c/w a biliary source of bleeding RECOMMENDATIONS:  see chart for further plan of treatment, discussed with surgical service, will proceed with CT angiogram  REPEAT EXAM:   In 0 year(s) for.   _______________________________ Hedwig Morton. Juanda Chance, MD    CC:

## 2010-11-07 LAB — GLUCOSE, CAPILLARY
Glucose-Capillary: 87 mg/dL (ref 70–99)
Glucose-Capillary: 68 mg/dL — ABNORMAL LOW (ref 70–99)

## 2010-11-07 LAB — HEMOGLOBIN AND HEMATOCRIT, BLOOD
HCT: 23.8 % — ABNORMAL LOW (ref 39.0–52.0)
Hemoglobin: 7.8 g/dL — ABNORMAL LOW (ref 13.0–17.0)

## 2010-11-07 LAB — URINE CULTURE
Colony Count: NO GROWTH
Culture  Setup Time: 201202162202
Special Requests: POSITIVE

## 2010-11-07 LAB — PSA: PSA: 2.12 ng/mL (ref <=4.00)

## 2010-11-07 LAB — PROTIME-INR: Prothrombin Time: 13.8 seconds (ref 11.6–15.2)

## 2010-11-08 LAB — CBC
HCT: 24 % — ABNORMAL LOW (ref 39.0–52.0)
Hemoglobin: 7.8 g/dL — ABNORMAL LOW (ref 13.0–17.0)
MCH: 29.3 pg (ref 26.0–34.0)
MCHC: 32.5 g/dL (ref 30.0–36.0)

## 2010-11-08 LAB — GLUCOSE, CAPILLARY
Glucose-Capillary: 85 mg/dL (ref 70–99)
Glucose-Capillary: 93 mg/dL (ref 70–99)
Glucose-Capillary: 85 mg/dL (ref 70–99)
Glucose-Capillary: 104 mg/dL — ABNORMAL HIGH (ref 70–99)

## 2010-11-08 LAB — COMPREHENSIVE METABOLIC PANEL
BUN: 10 mg/dL (ref 6–23)
CO2: 27 mEq/L (ref 19–32)
Calcium: 8.8 mg/dL (ref 8.4–10.5)
Creatinine, Ser: 0.85 mg/dL (ref 0.4–1.5)
GFR calc non Af Amer: >60 mL/min (ref >60)
Glucose, Bld: 108 mg/dL — ABNORMAL HIGH (ref 70–99)

## 2010-11-09 LAB — GLUCOSE, CAPILLARY: Glucose-Capillary: 110 mg/dL — ABNORMAL HIGH (ref 70–99)

## 2010-11-10 LAB — GLUCOSE, CAPILLARY
Glucose-Capillary: 87 mg/dL (ref 70–99)
Glucose-Capillary: 91 mg/dL (ref 70–99)
Glucose-Capillary: 105 mg/dL — ABNORMAL HIGH (ref 70–99)

## 2010-11-10 LAB — GENTAMICIN LEVEL, TROUGH: Gentamicin Trough: 0.4 ug/mL — ABNORMAL LOW (ref 0.5–2.0)

## 2010-11-10 LAB — CBC
Hemoglobin: 8.4 g/dL — ABNORMAL LOW (ref 13.0–17.0)
MCHC: 32.3 g/dL (ref 30.0–36.0)
RBC: 2.9 MIL/uL — ABNORMAL LOW (ref 4.22–5.81)

## 2010-11-11 LAB — BASIC METABOLIC PANEL
CO2: 26 mEq/L (ref 19–32)
Chloride: 99 mEq/L (ref 96–112)
Creatinine, Ser: 0.94 mg/dL (ref 0.4–1.5)
GFR calc Af Amer: >60 mL/min (ref >60)
Sodium: 133 mEq/L — ABNORMAL LOW (ref 135–145)

## 2010-11-11 LAB — GLUCOSE, CAPILLARY
Glucose-Capillary: 96 mg/dL (ref 70–99)
Glucose-Capillary: 86 mg/dL (ref 70–99)
Glucose-Capillary: 93 mg/dL (ref 70–99)

## 2010-11-12 ENCOUNTER — Encounter (INDEPENDENT_AMBULATORY_CARE_PROVIDER_SITE_OTHER): Payer: Self-pay | Admitting: *Deleted

## 2010-11-12 LAB — GLUCOSE, CAPILLARY
Glucose-Capillary: 109 mg/dL — ABNORMAL HIGH (ref 70–99)
Glucose-Capillary: 81 mg/dL (ref 70–99)
Glucose-Capillary: 95 mg/dL (ref 70–99)

## 2010-11-13 LAB — CBC
HCT: 25.6 % — ABNORMAL LOW (ref 39.0–52.0)
Hemoglobin: 8.4 g/dL — ABNORMAL LOW (ref 13.0–17.0)
RDW: 14.1 % (ref 11.5–15.5)
WBC: 8.1 10*3/uL (ref 4.0–10.5)

## 2010-11-13 LAB — GLUCOSE, CAPILLARY: Glucose-Capillary: 81 mg/dL (ref 70–99)

## 2010-11-13 LAB — BASIC METABOLIC PANEL
CO2: 26 mEq/L (ref 19–32)
GFR calc non Af Amer: >60 mL/min (ref >60)
Glucose, Bld: 99 mg/dL (ref 70–99)
Potassium: 4.5 mEq/L (ref 3.5–5.1)
Sodium: 134 mEq/L — ABNORMAL LOW (ref 135–145)

## 2010-11-14 LAB — GLUCOSE, CAPILLARY: Glucose-Capillary: 83 mg/dL (ref 70–99)

## 2010-11-17 ENCOUNTER — Ambulatory Visit: Payer: Self-pay | Admitting: Cardiology

## 2010-11-17 ENCOUNTER — Encounter (INDEPENDENT_AMBULATORY_CARE_PROVIDER_SITE_OTHER): Payer: Self-pay | Admitting: *Deleted

## 2010-11-18 NOTE — Letter (Signed)
Summary: Appointment - Missed  [facility]  [address], [place] [address]   Phone: [phone]  Fax: [phone]     November 12, 2010 MRN: [id number]   [name] [address] [place], [place]  [address]   Dear [name],  Our records indicate you missed your appointment on           11/12/10 [facility]           It is very important that we reach you to reschedule this appointment. We look forward to participating in your health care needs. Please contact us at the number listed above at your earliest convenience to reschedule this appointment.     Sincerely,    Glass blower/designer

## 2010-11-18 NOTE — Progress Notes (Signed)
[name], [name]              ACCOUNT NO.:  [id number]  MEDICAL RECORD NO.:  [id number]           PATIENT TYPE:  I  LOCATION:  5010                         FACILITY:  [facility]  PHYSICIAN:  Lonia Blood, M.D.DATE OF BIRTH:  Dec 23, 1950                                PROGRESS NOTE   PRIMARY CARE PHYSICIAN: Unassigned/no local primary care physician.  ACTIVE DIAGNOSES THIS HOSPITAL STAY/AT PRESENT: 1. Brisk severe upper gastrointestinal bleed.     a.     Felt to be related to hepatic artery aneurysm versus      pseudoaneurysm versus mycotic aneurysm.     b.     Status post selective arterial embolization per      Interventional Radiology. 2. Acute severe blood loss anemia.     a.     Hemoglobin nadir less than 6.     b.     Hemoglobin presently stable at 7.5-8.0.     c.     Complicated by concomitant Coumadin-induced coagulopathy. 3. Coagulopathy.     a.     Secondary to Coumadin plus alcohol abuse.     b.     Corrected acutely.     c.     Holding off further anticoagulation. 4. History of deep vein thrombosis.     a.     Originally diagnosed approximately 6 months ago.     b.     No prior history of deep vein thrombosis or clot formation. 5. Bioprosthetic aortic valve.     a.     Original surgery in 1971.     b.     No evidence of aortic stenosis on transesophageal echo this      admission. 6. Hypokalemia - repleting. 7. Alcohol abuse. 8. Possible endocarditis.     a.     Transesophageal echocardiography negative.     b.     Infectious Disease directing antibiotic therapy with      vancomycin, gentamicin, and Unasyn.     c.     Evidence of possible septic emboli to kidney and spleen as      well as Janeway lesions on physical exam.     d.     Source unclear at present time. 9. One-half pack per day tobacco abuse.  CONSULTATIONS: 1. [facility]. 2. Infectious Disease. 3. Vascular Surgery. 4. [facility].  PROCEDURE TO-DATE: 1. CT scan OF abdomen and  pelvis on October 27, 2010 - findings     suspicious for embolic or septic emboli affecting multiple organs.     Findings are suggestive of acute infarcts of both kidneys and     spleen.  Areas of scarring are noted in both kidneys.  Possible     pseudoaneurysm versus aneurysm of hepatic artery or cystic artery     branches.  Probable hemorrhage within the gallbladder raising a     question of possible fistulous connection with pseudoaneurysm in     the porta hepatis to the gallbladder.  Abnormal tubular filling     defects within multiple small bowel loops in the upper abdomen. 2. Enteroscopy per  Dr. Lina Sar on October 28, 2010 - normal EGD,     normal duodenum, blood in the second portion of the duodenum. 3. CT angio of the chest, abdomen, and pelvis on February 2012 -     ectatic ascending aorta.  Small bilateral pleural effusions with     atelectasis in the lung bases.  Cardiomegaly.  Pseudoaneurysms in     the right hepatic artery, effectively treated with embolization.     Right hepatic artery in [name] appears to be severely attenuated     and effectively embolized.  Stable appearance of splinting kidneys.     Indistinct stranding in the porta hepatis.  Foley catheter noted.     Left inguinal hernia with hydrocele. 4. 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  Findings consistent     with pseudoaneurysms.  Pseudoaneurysms and right hepatic artery     were successfully embolized using coils. 5. Magnetic resonance cholangiopancreatography on October 31, 2010 -     blood clot within the gallbladder.  No evidence of pseudoaneurysm     or fistulous communication to the biliary tree.  Mild intra and     extrahepatic biliary ductal dilatation.  Cannot exclude tiny stone     or stones versus blood clot within the dependent common duct just     above  ampulla.  Moderate motion with great exam.  Small volume of     ascites with small bilateral pleural effusions. 6. Transesophageal echocardiogram on October 29, 2010 - no evidence of     endocarditis with thickened aortic valve, but no aortic valve     stenosis and trivial aortic insufficiency.  HOSPITAL COURSE: To date - [name] is a pleasant 60 year old [name] who originally presented to [facility] on the date of his admission on October 27, 2010.  At that time, he complained of black stools and severe abdominal pain.  Of note, the patient has developed a DVT in July 2011 and at that time was placed on Coumadin.  This appeared to be the first episode of clotting in this [name]'s history.  The patient did in fact additionally admitted due to heavy alcohol abuse which was ongoing.  One week prior, he has presented to [facility].  He reported that he developed severe abdominal discomfort and distention.  Then, he began to have stools which he describes as melanotic.  He has taken Advil and continued to drink in attempt to treat his worsening abdominal pain.  He then began to experience significant dizziness.  In addition to his black stools, he had occasional bright red bloods per his rectum as well.  The patient originally presented to [facility].  After the CT scan findings suggest a possible pseudoaneurysm as noted above, he was transferred to [facility].  At [facility], he underwent an endoscopy per GI.  There was evidence of possible blood coming from the ampulla of Vater and there was therefore significant concern that the patient was bleeding from the biliary source.  The patient underwent a CT scan of the abdomen and pelvis.  This revealed a possible pseudoaneurysm of the right artery as noted above.  There was also evidence of amorphous material within the gallbladder itself which was felt to likely represent blood.  The patient was also appreciated to  be  developing severe acute blood loss anemia.  The patient required transfusion of blood products.  He was placed on empiric antibiotic therapy as of concern for possible mycotic aneurysms of the biliary vasculature. General Surgery and Vascular Surgery were consulted.  Interventional Radiology was also consulted.  The patient underwent an angiogram of the abdomen and ultimately received selective arterial embolization to the pseudoaneurysms and to the right hepatic artery.  He tolerated this well.  Followup studies confirmed successful embolization in this area. After embolization, his hemoglobin appeared to stabilize.  Attention was then turned to the potential source of the patient's findings.  ID consultation was greatly concerned about the possibility of endocarditis with septic emboli given the findings noted in both of his kidneys, his spleen, and his liver.  A transesophageal echocardiogram was carried out.  This in fact proved to be quite unrevealing with no evidence of endocarditis or vegetations.  It was noted that the patient's aortic valve was somewhat thickened, but there was no evidence of stenosis. Since the time of the patient's embolization, he is improved clinically and has become much more stable.  Of course, his Coumadin-induced coagulopathy had to be acutely reversed during the time of his bleeding and he remains off all anticoagulants at the present time.  Some issues regarding electrolyte imbalances have been corrected and this is felt to be due to his poor p.o. intake.  The patient is alert, oriented, and stabilizing clinically.  At this point, the question remains as to the source of the patient's multiple abnormal findings on CT scan of the abdomen.  ID remains concerned about the possibility of endocarditis and even mycotic aneurysms.  At the present time, General Surgery does not feel that surgery is required in regard to the patient's gallbladder due to a  lack of evidence of acute cholecystitis and is otherwise unremarkable findings on his MRI.  Ongoing antibiotic therapy continues to be directed by the ID Service.  Further interventions will be at their direction.  Vascular Surgery has seen the patient and is available for further consultation as needed, but at the present time surgical intervention is not planned.  [facility] evaluated the patient initially during his episodes of significant bleeding and resultant hypovolemia, but they have now signed off as the patient is stabilized.  He is being transferred to a medical bed for ongoing inpatient observation while further plans can be made according to the ID Service's recommendations.     Lonia Blood, M.D.     JTM/MEDQ  D:  11/01/2010  T:  11/02/2010  Job:  [id number]  Electronically Signed by Jetty Duhamel M.D. on 11/18/2010 06:10:21 PM

## 2010-11-18 NOTE — Discharge Summary (Signed)
  [name], [name]              ACCOUNT NO.:  [id number]  MEDICAL RECORD NO.:  [id number]           PATIENT TYPE:  I  LOCATION:  5010                         FACILITY:  [facility]  PHYSICIAN:  Marinda Elk, M.D.DATE OF BIRTH:  1950/11/19  DATE OF ADMISSION:  10/27/2010 DATE OF DISCHARGE:  02/23/2012LH                              DISCHARGE SUMMARY   PRIMARY CARE PHYSICIAN:  None.  DISCHARGE DIAGNOSES: 1. Abdominal pain with dark stools with severe upper gastrointestinal     bleed, most likely secondary to hepatic aneurysm versus     pseudoaneurysm. 2. Transaminitis. 3. Leukocytosis. 4. Elevated INR. 5. Hyperglycemia.  DISCHARGE MEDICATIONS:  None.  DISPOSITION:  The patient will be transferred to [facility] for further evaluation as he might need surgery and needs a higher level of care.  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  IMAGING:  CT scan of the abdomen and pelvis shows findings suspicious for embolic or septic emboli disease affecting multiple organisms. Findings suggestive of acute infarct involving both kidneys and the spleen.  There is also scarring in both kidneys and the spleen.  The patient is reportedly having a bowel prosthesis probable pseudoaneurysm versus aneurysm, hepatic artery, and a cyst artery branch as described above.  BRIEF ADMITTING HISTORY AND PHYSICAL:  This is a 60 year old with past medical history of DVT 6 months ago, currently on Coumadin with no source of cause of abdominal pain presents with black stools.  He relates 1 week prior to admission he started having abdominal pain and abdominal discomfort.  He had one black stool 2 times for the last day. He started taking Advil and did not improve.  Please see dictation from October 27, 2010 for further details.  BRIEF HOSPITAL COURSE:  Abdominal pain with dark stools.  A CT scan of the  abdomen and pelvis was obtained that showed probable hepatic aneurysm.  Blood cultures were obtained and the patient was started on vancomycin and Zosyn.  The patient will be transferred to [facility] for further management as the high level of care that he needs.  We will have reverse his INR with vitamin K and fresh frozen plasma.     Marinda Elk, M.D.     AF/MEDQ  D:  11/14/2010  T:  11/15/2010  Job:  [id number]  Electronically Signed by Lambert Keto M.D. on 11/18/2010 04:07:15 PM

## 2010-11-24 ENCOUNTER — Encounter: Payer: Self-pay | Admitting: Licensed Clinical Social Worker

## 2010-11-27 NOTE — Letter (Signed)
Summary: Appointment - Missed  [name]  [address], [place] [address]   Phone: [phone]  Fax: [phone]     November 17, 2010 MRN: [id number]   [name] [address] [place], [place]  [address]   Dear Mr. [name],  Our records indicate you missed your appointment on          11/17/10              with Dr.       .      MCDOWELL                                 It is very important that we reach you to reschedule this appointment. We look forward to participating in your health care needs. Please contact us at the number listed above at your earliest convenience to reschedule this appointment.     Sincerely,    Glass blower/designer

## 2010-11-27 NOTE — Consult Note (Signed)
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  HISTORY OF PRESENT ILLNESS:  [name] is a 60 year old African American male with past medical history significant for aortic valve repair in 1971 who had also developed a DVT and was on Coumadin for this.  He also has a history of heavy alcohol abuse.  One week prior to admission to the hospital, he developed severe abdominal discomfort and distention and began having melanotic stools.  He started taking Advil which did not improve symptoms.  He developed worsening dizziness.  He was drinking heavily and was taking Coumadin for his DVT.  He was seen in the emergency room where he was found to have black stools and fresh red blood as well.  He was admitted to the Hospitalist Service and Intensive Care Unit.  He underwent endoscopy by GI.  GI found old blood in the stomach, but some fresh blood coming from the periampullary duodenum and the thought being that this blood was coming from a biliary source.  The patient then underwent a CT scan of the abdomen later on the 6th.  This showed a bounded area of 1.9 x 1.5 cm suspicious for pseudoaneurysm in the right hepatic artery.  Then inferior to this, there was another area with enhancing saccular area of 1.5 x 1.1 x 1.3 cm, thought to be suspicious for pseudoaneurysm as well inferior to the porta hepatis.  There was abnormal amorphous dense material within the gallbladder itself which was read as being  suspicious for blood clot within the gallbladder lumen, possibly some fistulous connection with vascular pseudoaneurysm.  Stomach also was found to have some particulate matter on the fundus on CT imaging.  Additionally, there was found to be multiple lesions consistent with embolic phenomena in the both kidneys as well as the spleen concerning for septic emboli.  The patient was started on broad-spectrum antibiotics including vancomycin and Zosyn.  Blood cultures were obtained prior to the antibiotics being initiated.  On talking to the patient, he claims that he did have some fevers a few days to admission, but does not give much in the way of history of fevers prior to his recent abdominal pain.  He does have quite poor teeth and says that some of them are "rotting."  He denies weight loss.  On examination, he has some areas that could be Janeway lesions on his hands and the soles of the feet and also some possible plantar hemorrhages, although he could not reliably identify how long the possible splinter hemorrhages had been there.  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  PAST MEDICAL HISTORY: 1. Cardiomyopathy, status post surgery in 1971. 2. History of valve repair, I believe told me it was the aortic valve. 3. Cardiomyopathy. 4. History of DVT in July 2011, on Coumadin.  FAMILY HISTORY:  Noncontributory.  SOCIAL HISTORY:  The patient smokes a half-pack per day.  He continues to drink at least 5 beers per day.  ALLERGIES:  No known drug allergies.  CURRENT MEDICATIONS:  Fentanyl, insulin, Flagyl, Versed, pantoprazole, Zosyn, vancomycin, Zofran, and Phenergan.  REVIEW OF SYSTEMS:  As described in the history of present illness, otherwise 12-point review of systems is negative.  PHYSICAL EXAMINATION:  VITAL SIGNS:  Temperature maximum in last  24 hours is 98.7, temperature current 98.4, blood pressure 165/120, pulse 98, respirations 16, and pulse ox 98% on room air. GENERAL:  The patient is underweight.  He appears uncomfortable.  He is alert and oriented to person, place, location, month, and date. HEENT:  Normocephalic and atraumatic.  His pupils are equal and reactive to light.  His sclerae are icteric.  Conjunctivae are clear.  Oropharynx with some black material present and some emesis.  Poor dentition. NECK:  Supple. CARDIOVASCULAR:  Tachycardic.  No murmurs, gallops, or rubs heard. LUNGS:  Clear to auscultation bilaterally without wheezes, rhonchi, or rales. ABDOMEN:  Distended and diffusely tender to palpation.  Diminished bowel sounds. EXTREMITIES:  Without edema. SKIN:  The patient's palms and soles reveal some hyperpigmented areas that could be Janeway lesions.  He claims the ones on his palms are new. He also has possible splinter hemorrhages as well in his nail beds, both on his hands and feet. NEUROLOGIC:  Nonfocal.  LABORATORY DATA:  CT scan imaging as described above.  Protime is 20.1, INR 1.69.  Comprehensive metabolic panel:  Sodium 142, potassium 3.9, chloride 112, bicarb 23, BUN and creatinine 16 and 0.73, glucose 131, bilirubin 1.3, alk phos 260, AST and ALT is 374 and 187.  MRSA/PCR screening negative.  Urinalysis:  0-2 white blood cells and urine screen lactic acid was 1.2 on admission.  Last CBC with differential:  White count 13.7, hemoglobin 5.6, and platelets 222.  MICROBIOLOGICAL DATA:  Blood culture on the 6th no growth x1 day.  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 1. Possible endovascular infection with source either being a mycotic     aneurysm or possibly infected heart valve:  The patient does have     possible some physical exam findings that might be consistent with     stigmata of endocarditis given the namely lesions on his hands and     soles as well as splinter hemorrhages.  He has poor dentition and     certainly could have seeded heart valve from his mouth.  Certainly,     the aneurysm could be infected.  If it did in fact eroded into the     gallbladder wall, certainly this could have been seeded by various     bacteria from the gallbladder.  So, this is certainly another     source of infectious embolism.  I think that vancomycin and  Zosyn     are reasonable for now to cover him for MRSA, MSSA, streptococcal     species, broadly cover him for abdominal pathogens.  Also add     gentamicin for synergy in case he has a viridans group strep or     other streptococcal species with intermediate sensitivity to     penicillin or enterococcal species causing endocarditis.  We will     follow his blood cultures.  I would recommend getting a TEE when he     can tolerate it.  I agree with consulting surgery.  My     understanding is that he is going to have embolization attempted     through IR of this aneurysm.  I do want it though if he is going     not need an exploratory laparotomy for repair of a possible fistula     between the aneurysm and his gallbladder.  Certainly if needed, I     would get hold also of Vascular Surgery if this would be needed. 2. Screening:  I will screen him for viral hepatitis and for HIV.  Thank you for this infectious disease consultation.     Acey Lav, MD     CV/MEDQ  D:  10/28/2010  T:  10/29/2010  Job:  [id number]  cc:   Oley Balm. Sung Amabile, MD Mary Sella. Andrey Campanile, MD Everardo Beals Juanda Chance, MD, [facility]  Electronically Signed by Paulette Blanch DAM MD on  11/26/2010 12:04:06 PM

## 2010-12-04 NOTE — Discharge Summary (Signed)
NAMEHIRAN, LEARD              ACCOUNT NO.:  [id number]  MEDICAL RECORD NO.:  [id number]           PATIENT TYPE:  I  LOCATION:  5010                         FACILITY:  [facility]  PHYSICIAN:  Peggye Pitt, M.D. DATE OF BIRTH:  [birth year]  DATE OF ADMISSION:  10/27/2010 DATE OF DISCHARGE:                              DISCHARGE SUMMARY   DATE OF DISCHARGE:  Still to be determined.  PRIMARY CARE PHYSICIAN:  He is unassigned.  He has no local primary care physician in the area.  DISCHARGE DIAGNOSES: 1. 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  Status post selective arterial embolization per     Interventional Radiology with resolution of bleed. 2. Acute severe blood loss anemia.  Complicated by Coumadin-induced     coagulopathy.  Hemoglobin currently stable between 7.8 and 8. 3. Coagulopathy secondary to Coumadin plus alcohol abuse.  At this     point, we will hold off further anticoagulation given his     gastrointestinal bleed. 4. History of deep venous thrombosis, originally diagnosed about 6     months ago, off anticoagulation secondary to gastrointestinal     bleed. 5. Bioprosthetic aortic valve in 1971.  No evidence of aortic stenosis     on TEE this admission.  6.  Possible endocarditis, TEE is negative,     ID is directing antibiotic therapy and they will like him to     continue vancomycin, gentamicin, and Unasyn until March 21. 6. Tobacco abuse.  DISCHARGE MEDICATIONS: 1. Gentamicin 60 mg IV every 8 hours. 2. Nicotine 14 mg patch to change daily. 3. Unasyn 3 g IV every 6 hours. 4. Pepcid 20 mg twice daily. 5. Vancomycin 1250 mg twice daily IV.  DISPOSITION AND FOLLOWUP:  Mr. Visser will be discharged from the hospital as soon as we can secure a safe discharge plan for him.  This would include some sort of facility where they can administer his antibiotic  therapy.  CONSULTATION THIS HOSPITALIZATION: 1. Acey Lav, MD with Infectious Disease. 2. Arlys John L. Imogene Burn, MD with Vascular Surgery. 3. Troy Sine. Dwain Sarna, MD with General Surgery.  IMAGES AND PROCEDURES PERFORMED THIS HOSPITALIZATION: 1. Chest x-ray on February 6 that showed no significant abnormalities. 2a.  A CT scan of the abdomen and pelvis on February 6 that showed findings suspicious for embolic or septic embolic disease involving multiple organs.  There are findings suggestive of acute infarcts involving both kidneys and the spleen.  There are areas of scarring in both kidneys and the spleen raising the possibility that this is an acute-on-chronic process. b.  Pseudoaneurysms versus aneurysms of the hepatic artery.  This abnormality is all suspicious for septic embolic disease (mycotic aneurysm formation). c.  Probable hemorrhage/clot formation within the gallbladder suggesting a fistulous connection from the pseudoaneurysm in the porta hepatis to the gallbladder. 1. The patient had a TEE which is a transesophageal echo on February 8     that showed a reduced systolic ejection fraction of 18-84% with  diffuse mild hypokinesis.  There was a bioprosthetic aortic valve     with thickened leaflets, but no vegetation noted.  No aortic     stenosis or insufficiency and no evidence of vegetations in any     other valves. 2. 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  The pseudoaneurysms     in right hepatic artery were successfully embolized using coils.  HISTORY AND PHYSICAL:  For complete details, please refer to dictation by Dr. Radonna Ricker on February 6, but in brief, Mr. Schellhase is a 60 year old gentleman, initially admitted to [facility] on February 6 with complaints of black stools and abdominal pain, however, because  of findings on CT scan transferred to [facility] was obtained, and we are asked to admit him for further evaluation and management.  HOSPITAL COURSE BY ACTIVE PROBLEM: 1. Brisk severe upper GI bleed.  This has resolved after embolization.     Please see details of procedure above.  His hemoglobin has     maintained stable around 7.8 to 8 range.  I would not transfuse him     any further unless his hemoglobin were to drop below 7. 2. 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  Dr. Daiva Eves with Infectious Disease feels that despite the fact a TEE was     negative for vegetations, because there is a possibility of mycotic     aneurysm, especially in the presence of septic emboli seen in liver     and spleen, that he will need to complete a total of 6 weeks of IV     antibiotic therapy with vancomycin, Unasyn, and gentamicin.     Unfortunately, Mr. Grosser is presently homeless, so it is taking     social work some time to find an adequate and safe discharge plan     for him and he will be discharged there soon as we have this in     place. 3. History of DVT.  For obvious reasons, his anticoagulation has been     discontinued at this point.  Any further additions to hospital     course will be dictated at the time of discharge by discharging     physician.     Peggye Pitt, M.D.     EH/MEDQ  D:  11/11/2010  T:  11/12/2010  Job:  [id number]  cc:   Juanetta Gosling, MD Fransisco Hertz, MD Acey Lav, MD  Electronically Signed by Peggye Pitt M.D. on 12/04/2010 05:45:05 PM

## 2010-12-06 LAB — DIFFERENTIAL
Basophils Absolute: 0 10*3/uL (ref 0.0–0.1)
Eosinophils Absolute: 0.2 10*3/uL (ref 0.0–0.7)
Eosinophils Relative: 2 % (ref 0–5)
Lymphocytes Relative: 19 % (ref 12–46)
Lymphocytes Relative: 20 % (ref 12–46)
Lymphs Abs: 2.2 10*3/uL (ref 0.7–4.0)
Lymphs Abs: 2.4 10*3/uL (ref 0.7–4.0)
Monocytes Absolute: 0.7 10*3/uL (ref 0.1–1.0)
Monocytes Absolute: 1.2 10*3/uL — ABNORMAL HIGH (ref 0.1–1.0)
Monocytes Relative: 10 % (ref 3–12)
Monocytes Relative: 7 % (ref 3–12)
Neutro Abs: 8.7 10*3/uL — ABNORMAL HIGH (ref 1.7–7.7)

## 2010-12-06 LAB — LUPUS ANTICOAGULANT PANEL
DRVVT: 50.4 secs — ABNORMAL HIGH (ref 36.2–44.3)
PTT Lupus Anticoagulant: 52.1 secs — ABNORMAL HIGH (ref 30.0–45.6)
PTTLA 4:1 Mix: 47 secs — ABNORMAL HIGH (ref 30.0–45.6)
PTTLA Confirmation: 13.4 secs — ABNORMAL HIGH (ref <8.0)

## 2010-12-06 LAB — MAGNESIUM: Magnesium: 2.1 mg/dL (ref 1.5–2.5)

## 2010-12-06 LAB — PHOSPHORUS: Phosphorus: 3.9 mg/dL (ref 2.3–4.6)

## 2010-12-06 LAB — COMPREHENSIVE METABOLIC PANEL
ALT: 8 U/L (ref 0–53)
ALT: 9 U/L (ref 0–53)
AST: 14 U/L (ref 0–37)
Albumin: 3 g/dL — ABNORMAL LOW (ref 3.5–5.2)
Albumin: 3.1 g/dL — ABNORMAL LOW (ref 3.5–5.2)
Alkaline Phosphatase: 77 U/L (ref 39–117)
BUN: 6 mg/dL (ref 6–23)
CO2: 23 mEq/L (ref 19–32)
Calcium: 8.6 mg/dL (ref 8.4–10.5)
Chloride: 100 mEq/L (ref 96–112)
GFR calc Af Amer: >60 mL/min (ref >60)
Glucose, Bld: 95 mg/dL (ref 70–99)
Potassium: 4.1 mEq/L (ref 3.5–5.1)
Sodium: 132 mEq/L — ABNORMAL LOW (ref 135–145)
Sodium: 134 mEq/L — ABNORMAL LOW (ref 135–145)
Total Bilirubin: 1 mg/dL (ref 0.3–1.2)
Total Protein: 7.7 g/dL (ref 6.0–8.3)
Total Protein: 7.8 g/dL (ref 6.0–8.3)

## 2010-12-06 LAB — CBC
Hemoglobin: 12.5 g/dL — ABNORMAL LOW (ref 13.0–17.0)
Hemoglobin: 12.8 g/dL — ABNORMAL LOW (ref 13.0–17.0)
MCH: 31.3 pg (ref 26.0–34.0)
MCHC: 34 g/dL (ref 30.0–36.0)
MCHC: 34.4 g/dL (ref 30.0–36.0)
Platelets: 252 10*3/uL (ref 150–400)
Platelets: 278 10*3/uL (ref 150–400)
RDW: 12.6 % (ref 11.5–15.5)
RDW: 12.6 % (ref 11.5–15.5)

## 2010-12-06 LAB — CARDIOLIPIN ANTIBODIES, IGG, IGM, IGA
Anticardiolipin IgG: 7 GPL U/mL — ABNORMAL LOW (ref <23)
Anticardiolipin IgM: 7 MPL U/mL — ABNORMAL LOW (ref <11)

## 2010-12-06 LAB — CARDIAC PANEL(CRET KIN+CKTOT+MB+TROPI)
Relative Index: INVALID (ref 0.0–2.5)
Total CK: 86 U/L (ref 7–232)
Troponin I: 0.02 ng/mL (ref 0.00–0.06)

## 2010-12-06 LAB — BETA-2-GLYCOPROTEIN I ABS, IGG/M/A
Beta-2 Glyco I IgG: 1 G Units (ref <20)
Beta-2-Glycoprotein I IgA: 7 A Units (ref <20)
Beta-2-Glycoprotein I IgM: 4 M Units (ref <20)

## 2010-12-06 LAB — PROTIME-INR
INR: 1.04 (ref 0.00–1.49)
Prothrombin Time: 13.5 seconds (ref 11.6–15.2)

## 2010-12-06 LAB — PROTEIN S, TOTAL: Protein S Ag, Total: 97 % (ref 70–140)

## 2010-12-06 LAB — PROTHROMBIN GENE MUTATION

## 2010-12-10 ENCOUNTER — Encounter: Payer: Self-pay | Admitting: Infectious Disease

## 2010-12-10 ENCOUNTER — Ambulatory Visit (INDEPENDENT_AMBULATORY_CARE_PROVIDER_SITE_OTHER): Payer: Self-pay | Admitting: Infectious Disease

## 2010-12-10 ENCOUNTER — Other Ambulatory Visit: Payer: Self-pay | Admitting: Infectious Disease

## 2010-12-10 VITALS — BP 193/109 | HR 74 | Temp 98.2°F | Ht 65.0 in | Wt 120.8 lb

## 2010-12-10 DIAGNOSIS — Z952 Presence of prosthetic heart valve: Secondary | ICD-10-CM | POA: Insufficient documentation

## 2010-12-10 DIAGNOSIS — F1010 Alcohol abuse, uncomplicated: Secondary | ICD-10-CM | POA: Insufficient documentation

## 2010-12-10 DIAGNOSIS — I330 Acute and subacute infective endocarditis: Secondary | ICD-10-CM

## 2010-12-10 DIAGNOSIS — I729 Aneurysm of unspecified site: Secondary | ICD-10-CM | POA: Insufficient documentation

## 2010-12-10 DIAGNOSIS — Z954 Presence of other heart-valve replacement: Secondary | ICD-10-CM

## 2010-12-10 DIAGNOSIS — I728 Aneurysm of other specified arteries: Secondary | ICD-10-CM

## 2010-12-10 DIAGNOSIS — K922 Gastrointestinal hemorrhage, unspecified: Secondary | ICD-10-CM | POA: Insufficient documentation

## 2010-12-10 DIAGNOSIS — T826XXA Infection and inflammatory reaction due to cardiac valve prosthesis, initial encounter: Secondary | ICD-10-CM | POA: Insufficient documentation

## 2010-12-10 DIAGNOSIS — T827XXA Infection and inflammatory reaction due to other cardiac and vascular devices, implants and grafts, initial encounter: Secondary | ICD-10-CM

## 2010-12-10 MED ORDER — AMOXICILLIN 500 MG PO CAPS: 500.0000 mg | ORAL_CAPSULE | Freq: Three times a day (TID) | ORAL | Status: AC

## 2010-12-10 MED ORDER — AMOXICILLIN 500 MG PO CAPS: 500.0000 mg | ORAL_CAPSULE | Freq: Three times a day (TID) | ORAL | Status: DC

## 2010-12-10 NOTE — Assessment & Plan Note (Signed)
Still on coumadin, no evidence of recurernce of bleed at this point

## 2010-12-10 NOTE — Progress Notes (Signed)
  Subjective:    Patient ID: [name], male    DOB: 13-Aug-1951, 60 y.o.   MRN: [id number]  HPI   60 year old African  American male with past medical history significant for aortic valve  repair in 1971 who had also developed a DVT and was on Coumadin for  this.  He also has a history of heavy alcohol abuse.  One week prior to  admission to the hospital in February he developed severe abdominal discomfort and distention and began having melanotic stools. 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 He was initially placed on broad spectrum antibiotics including   vancomycin and Zosyn.  Blood cultures were obtained prior to the   antibiotics being initiated and remained negative. CT also disclosed evidence of emboli in the spleen and kidneys.  ON exam he had some stigmata of endocarditis with Janeway and possible splinters. His TEE was negative for vegetations. We changed him to unasyn, vancomycin and gentamicin to treat him for prosthetic valve endocarditis. The larger concern however was for his hepatic artery aneurysm. He did undergo embolization which stablized his bleeding. However we were concerned that he may have had a transient rupture of gallbladder that may have led to organisms seeding the walls of the hepatic artery and causing amycotic aneurysm. [facility]CS and vascular followed him closely and ex lap with possible resection of infected mycotic aneurysm was contemplated but not pursued. Currently he is doing well on IV antibiotics at SNF. He is without fevers, chills, melena abdominal pain or malaise.   Review of Systems  Constitutional: Negative for fever, chills, diaphoresis, activity change, appetite change and fatigue.  Respiratory: Negative for cough, shortness of breath and wheezing.   Cardiovascular: Negative for chest pain.  Gastrointestinal: Negative for abdominal pain, diarrhea, constipation, blood in stool and  abdominal distention.  Genitourinary: Negative for frequency and difficulty urinating.  Musculoskeletal: Negative for myalgias.  Neurological: Negative for dizziness, syncope and light-headedness.  Psychiatric/Behavioral: Negative for behavioral problems, confusion, dysphoric mood and agitation.       Objective:   Physical Exam  Constitutional: He is oriented to person, place, and time. No distress.  HENT:  Head: Normocephalic and atraumatic.  Right Ear: External ear normal.  Nose: Nose normal.  Mouth/Throat: Oropharynx is clear and moist.  Eyes: Conjunctivae and EOM are normal. Pupils are equal, round, and reactive to light.  Neck: Normal range of motion. No JVD present.  Cardiovascular: Normal rate and regular rhythm.   Murmur heard. Pulmonary/Chest: Effort normal. No respiratory distress. He has no wheezes. He has no rales. He exhibits no tenderness.  Abdominal: He exhibits no distension and no mass. There is no tenderness. There is no rebound and no guarding.  Musculoskeletal: He exhibits no edema.  Neurological: He is alert and oriented to person, place, and time.  Skin: Skin is warm and dry. No rash noted. He is not diaphoretic. No erythema. No pallor.       Patients PICC line was CDI and without fluctuance. He still had some ? Janeways on his palms and splinters in his nails  Psychiatric: He has a normal mood and affect. His behavior is normal. Judgment and thought content normal.          Assessment & Plan:

## 2010-12-10 NOTE — Assessment & Plan Note (Signed)
I really think he needs to have consideration of resection of his hepatic artery segment by tertiary care center. Will refer to [facility] vs [facility] vs [facility]. In the interim I will try to suppress this infection with amoxicillin 500mg  tid indefinitely

## 2010-12-10 NOTE — Progress Notes (Signed)
Order given for PICC removal.   PICC intact with sutures present right arm.  Dressing removed and area cleaned . Wound site unremarkable.  Sutures removed. 38 cm PICC removed without complications. Petroleum gauze dressing applied and pt informed to leave dressing on for 24 hours.  Pt tolerated the procedure well.  Laurell Josephs, RN IV

## 2010-12-10 NOTE — Patient Instructions (Signed)
Your PICC line can be removed I am starting you on amoxicillin 500mg  three times daily indefinitely We may need to refer you to a surgeon at [facility] or [facility] Schedule a follow up appt with Dr. Daiva Eves in early May

## 2010-12-10 NOTE — Progress Notes (Signed)
Addended by: Acey Lav on: 12/10/2010 11:13 AM   Modules accepted: Orders

## 2010-12-10 NOTE — Assessment & Plan Note (Signed)
He is sp 6 weeks of IV unasyn, vancomycin and gentamicin. Will pull picc line. See below for discussion re his hepatic aneurysm.

## 2010-12-10 NOTE — Assessment & Plan Note (Signed)
See above discussion. Bleeding was stopped with embolization but I worry about this being mycotic and progressing. Will try to suppress with po abx

## 2010-12-19 ENCOUNTER — Encounter: Payer: Self-pay | Admitting: Cardiology

## 2010-12-19 DIAGNOSIS — Z954 Presence of other heart-valve replacement: Secondary | ICD-10-CM

## 2010-12-19 DIAGNOSIS — Z7901 Long term (current) use of anticoagulants: Secondary | ICD-10-CM | POA: Insufficient documentation

## 2010-12-19 DIAGNOSIS — I82409 Acute embolism and thrombosis of unspecified deep veins of unspecified lower extremity: Secondary | ICD-10-CM

## 2011-01-08 ENCOUNTER — Encounter: Payer: Self-pay | Admitting: Family Medicine

## 2011-01-09 ENCOUNTER — Encounter: Payer: Self-pay | Admitting: Family Medicine

## 2011-01-09 ENCOUNTER — Ambulatory Visit (INDEPENDENT_AMBULATORY_CARE_PROVIDER_SITE_OTHER): Payer: Self-pay | Admitting: Family Medicine

## 2011-01-09 VITALS — BP 110/70 | HR 89 | Resp 16 | Ht 64.0 in | Wt 120.4 lb

## 2011-01-09 DIAGNOSIS — I38 Endocarditis, valve unspecified: Secondary | ICD-10-CM

## 2011-01-09 DIAGNOSIS — Z09 Encounter for follow-up examination after completed treatment for conditions other than malignant neoplasm: Secondary | ICD-10-CM

## 2011-01-09 DIAGNOSIS — I82409 Acute embolism and thrombosis of unspecified deep veins of unspecified lower extremity: Secondary | ICD-10-CM

## 2011-01-09 DIAGNOSIS — K922 Gastrointestinal hemorrhage, unspecified: Secondary | ICD-10-CM

## 2011-01-09 DIAGNOSIS — F17200 Nicotine dependence, unspecified, uncomplicated: Secondary | ICD-10-CM

## 2011-01-09 NOTE — Patient Instructions (Addendum)
F/U in 3 months.  You have been very ill, but ,are now much better, do not give up  Your next medical  appt is in [place], at the infectious disease clinic with Dr. Daiva Eves on May 7 at 9:15am   Please think about quitting smoking.  This is very important for your health.  Consider setting a quit date, then cutting back or switching brands to prepare to stop.  Also think of the money you will save every day by not smoking.  Quick Tips to Quit Smoking: Fix a date i.e. keep a date in mind from when you would not touch a tobacco product to smoke  Keep yourself busy and block your mind with work loads or reading books or watching movies in malls where smoking is not allowed  Vanish off the things which reminds you about smoking for example match box, or your favorite lighter, or the pipe you used for smoking, or your favorite jeans and shirt with which you used to enjoy smoking, or the club where you used to do smoking  Try to avoid certain people places and incidences where and with whom smoking is a common factor to add on  Praise yourself with some token gifts from the money you saved by stopping smoking  Anti Smoking teams are there to help you. Join their programs  Anti-smoking Gums are there in many medical shops. Try them to quit smoking   Side-effects of Smoking: Disease caused by smoking cigarettes are emphysema, bronchitis, heart failures  Premature death  Cancer is the major side effect of smoking  Heart attacks and strokes are the quick effects of smoking causing sudden death  Some smokers lives end up with limbs amputated  Breathing problem or fast breathing is another side effect of smoking  Due to more intakes of smokes, carbon mono-oxide goes into your brain and other muscles of the body which leads to swelling of the veins and blockage to the air passage to lungs  Carbon monoxide blocks blood vessels which leads to blockage in the flow of blood to different major body organs  like heart lungs and thus leads to attacks and deaths  During pregnancy smoking is very harmful and leads to premature birth of the infant, spontaneous abortions, low weight of the infant during birth  Fat depositions to narrow and blocked blood vessels causing heart attacks  In many cases cigarette smoking caused infertility in men

## 2011-01-09 NOTE — Progress Notes (Signed)
  Subjective:    Patient ID: [name], male    DOB: April 07, 1951, 60 y.o.   MRN: [id number]  HPI Pt reports prolonged hospitalisation followed by a rehab facility since Feb 2012, when he was dx and treated for ruptured neurysm in his liver , returned dhome less than 2 weeks ago He was maintained on a long course of antibiotics presumably because of his prosthetic valve. He is stressed  Out , states he keeps to himself and has no support. He has not had his coumadin checked since d/c and he is to return for eval at the coumadin clinic. He denies any overt bleeding. Review of Systems Denies recent fever or chills.Feels wak and tired Denies sinus pressure, nasal congestion, ear pain or sore throat. Denies chest congestion, productive cough or wheezing. Denies chest pains, palpitations, paroxysmal nocturnal dyspnea, orthopnea and leg swelling Denies abdominal pain, nausea, vomiting,diarrhea or constipation.  Denies rectal bleeding or change in bowel movement. Denies dysuria, frequency, hesitancy or incontinence. Denies joint pain, swelling and limitation and mobility. Reorts headache, poor sleep, depression and anxiety. Denies suicidal or homicidal ideation, denies hallucinations. Does not want therapy or meds. Denies skin break down or rash. Smokes between 7 to 10 cigs daily, not willing to set quit date        Objective:   Physical Exam Chronically ill appearing male, in no Cardiopulmonary distress.  HEENT: No facial asymmetry, EOMI, no sinus tenderness, TM's clear, Oropharynx pink and moist.  Neck supple no adenopathy.  Chest: Clear to auscultation bilaterally.Decreased air entry throughout  CVS: S1, S2, positive systolic urmurs, no S3.  ABD: Soft non tender. Bowel sounds normal.  Ext: No edema  MS: Adequate ROM spine, shoulders, hips and knees.  Skin: Intact, no ulcerations or rash noted.  Psych: Good eye contact, normal affect. Memory intact not anxious or depressed  appearing.  CNS: CN 2-12 intact, power, tone and sensation normal throughout.        Assessment & Plan:  1. Recent upper gI bleed , stable at this time. 2. S/p aortic valve replacement: no decompensation 3. Nicotine use : cessation counselling done 4. Chronic coumadin: appt made for f/u at clinic

## 2011-01-10 ENCOUNTER — Encounter: Payer: Self-pay | Admitting: Family Medicine

## 2011-01-12 ENCOUNTER — Encounter: Payer: Self-pay | Admitting: Adult Health

## 2011-01-12 ENCOUNTER — Ambulatory Visit (INDEPENDENT_AMBULATORY_CARE_PROVIDER_SITE_OTHER): Payer: Self-pay | Admitting: *Deleted

## 2011-01-12 ENCOUNTER — Ambulatory Visit (INDEPENDENT_AMBULATORY_CARE_PROVIDER_SITE_OTHER): Payer: Self-pay | Admitting: Adult Health

## 2011-01-12 DIAGNOSIS — I82409 Acute embolism and thrombosis of unspecified deep veins of unspecified lower extremity: Secondary | ICD-10-CM

## 2011-01-12 DIAGNOSIS — Z954 Presence of other heart-valve replacement: Secondary | ICD-10-CM

## 2011-01-12 DIAGNOSIS — R0989 Other specified symptoms and signs involving the circulatory and respiratory systems: Secondary | ICD-10-CM

## 2011-01-12 DIAGNOSIS — R030 Elevated blood-pressure reading, without diagnosis of hypertension: Secondary | ICD-10-CM

## 2011-01-12 DIAGNOSIS — F1010 Alcohol abuse, uncomplicated: Secondary | ICD-10-CM

## 2011-01-12 DIAGNOSIS — I10 Essential (primary) hypertension: Secondary | ICD-10-CM

## 2011-01-12 DIAGNOSIS — T826XXA Infection and inflammatory reaction due to cardiac valve prosthesis, initial encounter: Secondary | ICD-10-CM

## 2011-01-12 DIAGNOSIS — I5031 Acute diastolic (congestive) heart failure: Secondary | ICD-10-CM

## 2011-01-12 DIAGNOSIS — R0602 Shortness of breath: Secondary | ICD-10-CM

## 2011-01-12 LAB — COMPREHENSIVE METABOLIC PANEL
ALT: 13 U/L (ref 0–53)
AST: 23 U/L (ref 0–37)
Albumin: 4.7 g/dL (ref 3.5–5.2)
BUN: 12 mg/dL (ref 6–23)
Calcium: 9.8 mg/dL (ref 8.4–10.5)
Chloride: 100 mEq/L (ref 96–112)
Potassium: 4.1 mEq/L (ref 3.5–5.3)
Total Protein: 7.9 g/dL (ref 6.0–8.3)

## 2011-01-12 LAB — CBC WITH DIFFERENTIAL/PLATELET
Basophils Absolute: 0 10*3/uL (ref 0.0–0.1)
HCT: 42.6 % (ref 39.0–52.0)
Lymphocytes Relative: 21 % (ref 12–46)
Monocytes Absolute: 0.7 10*3/uL (ref 0.1–1.0)
Neutro Abs: 5.4 10*3/uL (ref 1.7–7.7)
RDW: 14.9 % (ref 11.5–15.5)
WBC: 7.8 10*3/uL (ref 4.0–10.5)

## 2011-01-12 MED ORDER — OLMESARTAN MEDOXOMIL-HCTZ 40-12.5 MG PO TABS: 1.0000 | ORAL_TABLET | Freq: Every day | ORAL | Status: DC

## 2011-01-12 NOTE — Patient Instructions (Signed)
Your physician recommends that you return for lab work in: today  Your physician has recommended you make the following change in your medication: start taking Benicar 40/12.5mg  daily. DO NOT RESTART COUMADIN FOR NOW!!!  Your physician recommends that you schedule a follow-up appointment in: 1 month

## 2011-01-12 NOTE — Assessment & Plan Note (Signed)
Will not restart coumadin at this time for reasons of medical noncompliance, ETOH abuse, GIB and inconsistent follow-up with cardiology.  He is without income and is trying to establish himself with medication assistance.  He is frequently homeless, but is currently staying with a friend.  I have explained this to him and he verbalizes understanding.  Bleeding risks are too high compared to risks of emboli and CVA at this time.

## 2011-01-12 NOTE — Assessment & Plan Note (Signed)
TEE is negative for evidence of vegetation.  He is on abx amoxicillin for this, but does not take this regularly.  This is of concern but he is asymptomatic at this time.

## 2011-01-12 NOTE — Assessment & Plan Note (Signed)
No evidence of fluid overload at this time. 

## 2011-01-12 NOTE — Assessment & Plan Note (Signed)
[name] blood pressure is clearly not well controlled.  He is not on any medications for this.  This is surprising to me in the most recent admission where he was not prescribed any antihypetensives.  He was normotensive on last recording in discharge summary in February of 2012.  He apparently saw Dr. Lodema Hong on 01/09/2011 but was not prescribed medications.    I have given him samples of Benicar 40/12.5mg  to take daily.  He is to have CMET and CBC drawn today. We will follow up with him in one month.

## 2011-01-12 NOTE — Progress Notes (Signed)
HPI: 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  He was admitted in Feb of 2012 for these diagnosis.  He was taken off of coumadin at that time.  He states he followed up with Dr. Lodema Hong who has not reordered coumadin until seen by cardiology.  He comes to coumadin clinic today to be evaluated. Vashti Hey RN, coumadin nurse, who has asked that he be evaluated.    He does not take any medication at this time.  He is prescribed abx-amphicillan but is not taking it.  He is not Rx any antihypertensives.  He complaints of DOE, mild abdominal pain with NV, no diarrhea.  He states the medication makes him throw up.  He comes today hypertensive.  He denies abdominal pain, hematemesis, tarry stools.    No Known Allergies  Current Outpatient Prescriptions  Medication Sig Dispense Refill  . acetaminophen (TYLENOL) 500 MG tablet Take 500 mg by mouth every 6 (six) hours as needed.        [name] aspirin 325 MG tablet Take 325 mg by mouth daily.        . ferrous sulfate 325 (65 FE) MG tablet Take 325 mg by mouth 2 (two) times daily.        . ondansetron (ZOFRAN) 4 MG tablet Take 4 mg by mouth every 8 (eight) hours as needed.        . warfarin (COUMADIN) 5 MG tablet Take by mouth as directed.       . olmesartan-hydrochlorothiazide (BENICAR HCT) 40-12.5 MG per tablet Take 1 tablet by mouth daily.  56 tablet  0    Past Medical History  Diagnosis Date  . Mycotic aneurysm   . Prosthetic valve endocarditis   . ETOH abuse     heavy use  . DVT (deep venous thrombosis)   . S/P aortic valve  replacement     pt was rx for prosthetic valve endocarditis    Past Surgical History  Procedure Date  . Aoric valve replacement 09/2003    29mm Medtronic Freestyle stentless porcine bioprosthesis [facility])    EAV:WUJWJX of systems complete and found to be negative unless listed above PHYSICAL EXAM BP 182/112  Pulse 89  Ht 5\' 4"  (1.626 m)  Wt 119 lb (53.978 kg)  BMI 20.43 kg/m2 General: Well developed, well nourished, in no acute distress, poor hygiene. Head: Eyes PERRLA, No xanthomas.   Normal cephalic and atramatic  Lungs: Clear bilaterally to auscultation and percussion. Heart: HRRR S1 S2, 1/6 systolic murmur. Occasional extra systole. Pulses are 2+ & equal.            No carotid bruit. No JVD.  No abdominal bruits. No femoral bruits. Abdomen: Bowel sounds are positive, abdomen soft and non-tender without masses or                  Hernia's noted. Msk:  Back normal, normal gait. Normal strength and tone for age. Extremities: No clubbing, cyanosis or edema.  DP +1 Neuro: Alert and oriented X 3. Psych:  Good affect, responds appropriately EKG:NSR with sinus arrhythmia. Rate of 80 bpm.  ASSESSMENT AND PLAN

## 2011-01-12 NOTE — Assessment & Plan Note (Signed)
He unfortunately continues to drink beer daily.  " I don't drink alcohol, just beer."  He is advised on cessation immediately.

## 2011-01-20 HISTORY — PX: OTHER SURGICAL HISTORY: SHX169

## 2011-01-26 ENCOUNTER — Ambulatory Visit: Payer: Self-pay | Admitting: Infectious Disease

## 2011-01-28 ENCOUNTER — Encounter: Payer: Self-pay | Admitting: Infectious Disease

## 2011-01-28 ENCOUNTER — Ambulatory Visit (INDEPENDENT_AMBULATORY_CARE_PROVIDER_SITE_OTHER): Payer: Self-pay | Admitting: Infectious Disease

## 2011-01-28 VITALS — BP 191/91 | HR 73 | Wt 118.0 lb

## 2011-01-28 DIAGNOSIS — T826XXA Infection and inflammatory reaction due to cardiac valve prosthesis, initial encounter: Secondary | ICD-10-CM

## 2011-01-28 DIAGNOSIS — I729 Aneurysm of unspecified site: Secondary | ICD-10-CM

## 2011-01-28 DIAGNOSIS — T827XXA Infection and inflammatory reaction due to other cardiac and vascular devices, implants and grafts, initial encounter: Secondary | ICD-10-CM

## 2011-01-28 DIAGNOSIS — Z954 Presence of other heart-valve replacement: Secondary | ICD-10-CM

## 2011-01-28 DIAGNOSIS — I330 Acute and subacute infective endocarditis: Secondary | ICD-10-CM

## 2011-01-28 LAB — CBC WITH DIFFERENTIAL/PLATELET
Basophils Absolute: 0 10*3/uL (ref 0.0–0.1)
Basophils Relative: 1 % (ref 0–1)
MCHC: 34 g/dL (ref 30.0–36.0)
Neutro Abs: 5.1 10*3/uL (ref 1.7–7.7)
Neutrophils Relative %: 63 % (ref 43–77)
Platelets: 201 10*3/uL (ref 150–400)
RDW: 15.6 % — ABNORMAL HIGH (ref 11.5–15.5)

## 2011-01-28 LAB — COMPREHENSIVE METABOLIC PANEL
AST: 37 U/L (ref 0–37)
Albumin: 4.5 g/dL (ref 3.5–5.2)
Alkaline Phosphatase: 86 U/L (ref 39–117)
BUN: 11 mg/dL (ref 6–23)
Potassium: 4.4 mEq/L (ref 3.5–5.3)
Sodium: 139 mEq/L (ref 135–145)
Total Protein: 8.2 g/dL (ref 6.0–8.3)

## 2011-01-28 MED ORDER — CIPROFLOXACIN HCL 500 MG PO TABS: 500.0000 mg | ORAL_TABLET | Freq: Two times a day (BID) | ORAL | Status: AC

## 2011-01-28 NOTE — Progress Notes (Signed)
Subjective:    Patient ID: [name], male    DOB: April 22, 1951, 60 y.o.   MRN: [id number]  HPI  60 year old African American male with past medical history significant for aortic valve repair in 1971 who had also developed a DVT and was on Coumadin for this. He also has a history of heavy alcohol abuse. One week prior to admission to the hospital in February he developed severe abdominal discomfort and distention and began having melanotic stools. He was ultimately found to have a hepatic artery aneurysm that had been bleeding into his e periampullary Duodenum. He was initially placed on broad spectrum antibiotics including  vancomycin and Zosyn. Blood cultures were obtained prior to the  antibiotics being initiated and remained negative. CT also disclosed evidence of emboli in the spleen and kidneys. ON exam he had some stigmata of endocarditis with Janeway and possible splinters. His TEE was negative for vegetations. We changed him to unasyn, vancomycin and gentamicin to treat him for prosthetic valve endocarditis. The larger concern however was for his hepatic artery aneurysm. He did undergo embolization which stablized his bleeding. However we were concerned that he may have had a transient rupture of gallbladder that may have led to organisms seeding the walls of the hepatic artery and causing amycotic aneurysm. [facility]CS and vascular followed him closely and ex lap with possible resection of infected mycotic aneurysm was contemplated but not pursued. Currently he is doing well on IV antibiotics at SNF. He is without fevers, chills, melena abdominal pain or malaise. I had put him on amoxicillin but he has not been very tolerant. When he takes the amoxicillin it tends to make him nauseous and he frequently vomits. In the last month he probably took the medicine possibly twice a day for two weeks. He has felt relatively well and had not fevers or chills. He has had the gastrointestinal problems with  nausea with taking his amoxicillin as described above.   Review of Systems As in history does not is otherwise negative for melena negative for blood per rectum and otherwise negative on 12 point review of systems.    Objective:   Physical Exam    patient is alert oriented x4. He is underweight. H&T normal cytogenetics pupils equal round and light sclera anicteric his oropharynx clear without erythema or exudate neck supple cardiac exam regular rate and rhythm with 206 systolic murmur. Lungs are clear to auscultation bilaterally without wheezes or rales. Abdomen is soft nondistended with mild tenderness in epigastric region with no rebound. His extremities were without edema skin he has a midline sternotomy incision otherwise no rashes or lesions. Neurologic exam nonfocal normal strength and sensation and gait. Psychiatric normal mood and affect.    Assessment & Plan:  Mycotic aneurysm The reason for continued antibiotic therapy was a concern that his hepatic artery aneurysm may have been a mycotic aneurysm. There was much about a CT scan to suggest infection including the proximity to the gallbladder that seemed inflamed as well. I will repeat a CT scan of the abdomen and pelvis as a CT and a gram to reassess the aneurysm. I will change the ciprofloxacin for the interim. If the CT scan is reassuring I may consider taking him off antibiotic therapy and observing him. However however it is still concerning for a mycotic aneurysm I would like to refer him to a tertiary center to care center for consideration of removal of an at the mycotic tissue and versus long-term suppressive antibiotic therapy  which is not without risk in this gentleman.  AORTIC VALVE REPLACEMENT, HX OF There was concern about possible prosthetic valve endocarditis he was treated as such. His TEE that did not show any vegetations although as mentioned again the CT scan was concerning for endocarditis. He has received more than  adequate therapy.  Prosthetic valve endocarditis See above the patient has received more than adequete therapy for this    \

## 2011-01-28 NOTE — Assessment & Plan Note (Signed)
There was concern about possible prosthetic valve endocarditis he was treated as such. His TEE that did not show any vegetations although as mentioned again the CT scan was concerning for endocarditis. He has received more than adequate therapy.

## 2011-01-28 NOTE — Assessment & Plan Note (Signed)
The reason for continued antibiotic therapy was a concern that his hepatic artery aneurysm may have been a mycotic aneurysm. There was much about a CT scan to suggest infection including the proximity to the gallbladder that seemed inflamed as well. I will repeat a CT scan of the abdomen and pelvis as a CT and a gram to reassess the aneurysm. I will change the ciprofloxacin for the interim. If the CT scan is reassuring I may consider taking him off antibiotic therapy and observing him. However however it is still concerning for a mycotic aneurysm I would like to refer him to a tertiary center to care center for consideration of removal of an at the mycotic tissue and versus long-term suppressive antibiotic therapy which is not without risk in this gentleman.

## 2011-01-28 NOTE — Assessment & Plan Note (Signed)
See above the patient has received more than adequete therapy for this

## 2011-01-30 ENCOUNTER — Ambulatory Visit (HOSPITAL_COMMUNITY)
Admission: RE | Admit: 2011-01-30 | Discharge: 2011-01-30 | Disposition: A | Discharge status: Home / Self Care | Payer: Self-pay | Source: Ambulatory Visit | Attending: Infectious Disease | Admitting: Infectious Disease

## 2011-01-30 ENCOUNTER — Encounter (HOSPITAL_COMMUNITY): Payer: Self-pay

## 2011-01-30 DIAGNOSIS — I729 Aneurysm of unspecified site: Secondary | ICD-10-CM

## 2011-01-30 DIAGNOSIS — Z954 Presence of other heart-valve replacement: Secondary | ICD-10-CM

## 2011-01-30 DIAGNOSIS — K4090 Unilateral inguinal hernia, without obstruction or gangrene, not specified as recurrent: Secondary | ICD-10-CM | POA: Insufficient documentation

## 2011-01-30 DIAGNOSIS — N400 Enlarged prostate without lower urinary tract symptoms: Secondary | ICD-10-CM | POA: Insufficient documentation

## 2011-01-30 DIAGNOSIS — I38 Endocarditis, valve unspecified: Secondary | ICD-10-CM

## 2011-01-30 DIAGNOSIS — I728 Aneurysm of other specified arteries: Secondary | ICD-10-CM | POA: Insufficient documentation

## 2011-01-30 MED ORDER — IOHEXOL 350 MG/ML SOLN
100.0000 mL | Freq: Once | INTRAVENOUS | Status: AC | PRN
  Administered 2011-01-30: 100 mL via INTRAVENOUS

## 2011-02-05 ENCOUNTER — Emergency Department (HOSPITAL_COMMUNITY): Payer: Self-pay

## 2011-02-05 ENCOUNTER — Emergency Department (HOSPITAL_COMMUNITY)
Admission: EM | Admit: 2011-02-05 | Discharge: 2011-02-05 | Disposition: A | Discharge status: Other Acute Inpatient Hospital | Payer: Self-pay | Attending: Emergency Medicine | Admitting: Emergency Medicine

## 2011-02-05 ENCOUNTER — Inpatient Hospital Stay (HOSPITAL_COMMUNITY)
Admission: AD | Admit: 2011-02-05 | Discharge: 2011-02-15 | DRG: 254 | Disposition: A | Discharge status: Home Health | Payer: Medicaid Other | Source: Ambulatory Visit | Attending: Surgery | Admitting: Surgery

## 2011-02-05 ENCOUNTER — Other Ambulatory Visit: Payer: Self-pay | Admitting: Surgery

## 2011-02-05 DIAGNOSIS — I10 Essential (primary) hypertension: Secondary | ICD-10-CM | POA: Diagnosis present

## 2011-02-05 DIAGNOSIS — Z954 Presence of other heart-valve replacement: Secondary | ICD-10-CM

## 2011-02-05 DIAGNOSIS — T79A29A Traumatic compartment syndrome of unspecified lower extremity, initial encounter: Secondary | ICD-10-CM

## 2011-02-05 DIAGNOSIS — I743 Embolism and thrombosis of arteries of the lower extremities: Principal | ICD-10-CM | POA: Diagnosis present

## 2011-02-05 DIAGNOSIS — M2420 Disorder of ligament, unspecified site: Secondary | ICD-10-CM

## 2011-02-05 DIAGNOSIS — Z86718 Personal history of other venous thrombosis and embolism: Secondary | ICD-10-CM | POA: Insufficient documentation

## 2011-02-05 DIAGNOSIS — M629 Disorder of muscle, unspecified: Secondary | ICD-10-CM

## 2011-02-05 DIAGNOSIS — F17200 Nicotine dependence, unspecified, uncomplicated: Secondary | ICD-10-CM | POA: Diagnosis present

## 2011-02-05 DIAGNOSIS — I509 Heart failure, unspecified: Secondary | ICD-10-CM | POA: Diagnosis present

## 2011-02-05 DIAGNOSIS — H5440 Blindness, one eye, unspecified eye: Secondary | ICD-10-CM | POA: Diagnosis present

## 2011-02-05 DIAGNOSIS — I2510 Atherosclerotic heart disease of native coronary artery without angina pectoris: Secondary | ICD-10-CM | POA: Diagnosis present

## 2011-02-05 DIAGNOSIS — M79609 Pain in unspecified limb: Secondary | ICD-10-CM | POA: Insufficient documentation

## 2011-02-05 LAB — CBC
Hemoglobin: 14.3 g/dL (ref 13.0–17.0)
MCH: 28.1 pg (ref 26.0–34.0)
MCV: 83.9 fL (ref 78.0–100.0)
RBC: 5.08 MIL/uL (ref 4.22–5.81)
WBC: 12.1 10*3/uL — ABNORMAL HIGH (ref 4.0–10.5)

## 2011-02-05 LAB — COMPREHENSIVE METABOLIC PANEL
ALT: 24 U/L (ref 0–53)
AST: 42 U/L — ABNORMAL HIGH (ref 0–37)
Albumin: 4.1 g/dL (ref 3.5–5.2)
Alkaline Phosphatase: 88 U/L (ref 39–117)
BUN: 9 mg/dL (ref 6–23)
Chloride: 101 mEq/L (ref 96–112)
GFR calc Af Amer: >60 mL/min (ref >60)
Potassium: 3.8 mEq/L (ref 3.5–5.1)
Total Bilirubin: 1.8 mg/dL — ABNORMAL HIGH (ref 0.3–1.2)

## 2011-02-05 LAB — HEPARIN LEVEL (UNFRACTIONATED): Heparin Unfractionated: <0.1 IU/mL — ABNORMAL LOW (ref 0.30–0.70)

## 2011-02-05 LAB — RAPID URINE DRUG SCREEN, HOSP PERFORMED
Opiates: NOT DETECTED
Tetrahydrocannabinol: NOT DETECTED

## 2011-02-05 LAB — DIFFERENTIAL
Basophils Relative: 0 % (ref 0–1)
Eosinophils Absolute: 0.1 10*3/uL (ref 0.0–0.7)
Lymphs Abs: 1.1 10*3/uL (ref 0.7–4.0)
Monocytes Relative: 6 % (ref 3–12)
Neutro Abs: 10.2 10*3/uL — ABNORMAL HIGH (ref 1.7–7.7)
Neutrophils Relative %: 84 % — ABNORMAL HIGH (ref 43–77)

## 2011-02-05 LAB — URINALYSIS, ROUTINE W REFLEX MICROSCOPIC
Bilirubin Urine: NEGATIVE
Glucose, UA: NEGATIVE mg/dL
Specific Gravity, Urine: 1.02 (ref 1.005–1.030)
Urobilinogen, UA: 0.2 mg/dL (ref 0.0–1.0)

## 2011-02-05 LAB — MRSA PCR SCREENING: MRSA by PCR: NEGATIVE

## 2011-02-05 LAB — APTT: aPTT: 28 seconds (ref 24–37)

## 2011-02-06 ENCOUNTER — Telehealth: Payer: Self-pay | Admitting: Infectious Disease

## 2011-02-06 DIAGNOSIS — I743 Embolism and thrombosis of arteries of the lower extremities: Secondary | ICD-10-CM

## 2011-02-06 DIAGNOSIS — Z48812 Encounter for surgical aftercare following surgery on the circulatory system: Secondary | ICD-10-CM

## 2011-02-06 LAB — BASIC METABOLIC PANEL
Chloride: 100 mEq/L (ref 96–112)
Creatinine, Ser: 0.76 mg/dL (ref 0.4–1.5)
GFR calc Af Amer: >60 mL/min (ref >60)
GFR calc non Af Amer: >60 mL/min (ref >60)
Potassium: 3.4 mEq/L — ABNORMAL LOW (ref 3.5–5.1)

## 2011-02-06 LAB — CBC
Hemoglobin: 11.8 g/dL — ABNORMAL LOW (ref 13.0–17.0)
RBC: 4.22 MIL/uL (ref 4.22–5.81)
WBC: 9 10*3/uL (ref 4.0–10.5)

## 2011-02-06 LAB — HEPARIN LEVEL (UNFRACTIONATED): Heparin Unfractionated: <0.1 IU/mL — ABNORMAL LOW (ref 0.30–0.70)

## 2011-02-07 LAB — CBC
HCT: 33.5 % — ABNORMAL LOW (ref 39.0–52.0)
MCH: 27.9 pg (ref 26.0–34.0)
MCHC: 33.7 g/dL (ref 30.0–36.0)
RDW: 15.3 % (ref 11.5–15.5)

## 2011-02-08 LAB — BASIC METABOLIC PANEL
BUN: 3 mg/dL — ABNORMAL LOW (ref 6–23)
Calcium: 8.8 mg/dL (ref 8.4–10.5)
Creatinine, Ser: 0.76 mg/dL (ref 0.4–1.5)
GFR calc non Af Amer: >60 mL/min (ref >60)
Glucose, Bld: 104 mg/dL — ABNORMAL HIGH (ref 70–99)
Sodium: 136 mEq/L (ref 135–145)

## 2011-02-08 LAB — CBC
MCHC: 33.2 g/dL (ref 30.0–36.0)
MCV: 83.9 fL (ref 78.0–100.0)
Platelets: 144 10*3/uL — ABNORMAL LOW (ref 150–400)
RDW: 15.2 % (ref 11.5–15.5)
WBC: 7.1 10*3/uL (ref 4.0–10.5)

## 2011-02-09 ENCOUNTER — Inpatient Hospital Stay (HOSPITAL_COMMUNITY): Payer: Medicaid Other

## 2011-02-09 DIAGNOSIS — D689 Coagulation defect, unspecified: Secondary | ICD-10-CM

## 2011-02-09 LAB — CBC
Hemoglobin: 12.3 g/dL — ABNORMAL LOW (ref 13.0–17.0)
MCH: 28.7 pg (ref 26.0–34.0)
MCHC: 34.2 g/dL (ref 30.0–36.0)
Platelets: 192 10*3/uL (ref 150–400)
RDW: 15 % (ref 11.5–15.5)

## 2011-02-09 LAB — TISSUE CULTURE
Culture: NO GROWTH
Gram Stain: NONE SEEN

## 2011-02-09 LAB — PROTIME-INR: Prothrombin Time: 11.9 seconds (ref 11.6–15.2)

## 2011-02-09 LAB — HEPARIN LEVEL (UNFRACTIONATED): Heparin Unfractionated: <0.1 IU/mL — ABNORMAL LOW (ref 0.30–0.70)

## 2011-02-09 MED ORDER — IOHEXOL 300 MG/ML  SOLN
100.0000 mL | Freq: Once | INTRAMUSCULAR | Status: AC | PRN
  Administered 2011-02-09: 100 mL via INTRAVENOUS

## 2011-02-10 LAB — CBC
MCV: 83.2 fL (ref 78.0–100.0)
Platelets: 236 10*3/uL (ref 150–400)
RBC: 4.16 MIL/uL — ABNORMAL LOW (ref 4.22–5.81)
WBC: 6.5 10*3/uL (ref 4.0–10.5)

## 2011-02-10 LAB — SEDIMENTATION RATE: Sed Rate: 23 mm/hr — ABNORMAL HIGH (ref 0–16)

## 2011-02-10 LAB — HEPARIN LEVEL (UNFRACTIONATED): Heparin Unfractionated: 0.18 IU/mL — ABNORMAL LOW (ref 0.30–0.70)

## 2011-02-10 LAB — PROTIME-INR: INR: 0.92 (ref 0.00–1.49)

## 2011-02-10 NOTE — Op Note (Signed)
NAMEBURT, PIATEK              ACCOUNT NO.:  [id number]  MEDICAL RECORD NO.:  [id number]           PATIENT TYPE:  I  LOCATION:  3312                         FACILITY:  [facility]  PHYSICIAN:  Juleen China IV, MDDATE OF BIRTH:  1951-06-28  DATE OF PROCEDURE:  02/05/2011 DATE OF DISCHARGE:                              OPERATIVE REPORT   PREOPERATIVE DIAGNOSIS:  Left leg ischemia.  POSTOPERATIVE DIAGNOSIS:  Left leg ischemia.  PROCEDURE PERFORMED: 1. Left superficial femoral embolectomy. 2. Below-knee popliteal artery exposure. 3. Four-compartment fasciotomy.  ANESTHESIA:  General.  BLOOD LOSS:  See medical record.  SURGEON: 1. Charlena Cross, MD  ASSISTANT:  Newton Pigg, PA  SPECIMENS:  Left leg thrombus sent for microbiology and pathology.  FINDINGS:  Organized thrombus evacuated from the superficial femoral artery.  There was no distal thrombus.  He had a Doppler signal in his posterior tibial and anterior tibial artery at the end of the case.  INDICATIONS:  Mr. Luallen is a 60 year old gentleman who awoke this morning at 6:00 a.m. with numbness and pain in his left leg.  He presented to [facility] emergency department and was transferred to [facility] and I saw him in the holding area and felt that he indeed had ischemic changes to his left leg which included the lack of pulses and minimal motor, sensory function, I deemed this an emergency and took him straight to the operating room.  PROCEDURE:  The patient was identified in the holding area and taken to room 9, placed supine on the table.  General endotracheal anesthesia was administered.  The patient was prepped and draped in usual fashion.  A time-out was called.  Antibiotics were given.  Because the patient had a palpable femoral pulse, I elected to expose the below-knee popliteal artery.  A medial incision was made below the knee.  Cautery was used to divide the subcutaneous tissue.  The fascia was  identified and then opened with cautery.  The gastrocnemius muscle was reflected posteriorly.  The soleus muscle was taken down from the posterior side of the tibia throughout the length of the incision.  Meyerding retractor was placed, the popliteal space was entered.  I exposed first the popliteal vein.  There were paired popliteal veins surrounding the popliteal artery.  I had ligated the anterior tibial vein to get better exposure of the artery.  There was no pulse within the artery.  The popliteal artery was then exposed down to the origin of the anterior tibial artery.  At this point, the patient was given systemic heparinization and encircled the popliteal artery with Potts vessel loops.  I made a transverse arteriotomy with a #11 blade.  There was no bleeding upon the opening of the artery.  I then passed #4 Fogarty catheter retrograde and performed thromboembolectomy.  I evacuated a fair amount of thrombus, initially, this was sent for microbiology and pathology.  There was mild restoration of blood flow.  I then passed a Fogarty catheter an additional time and evacuated it was thought to be the arterial plug.  With this maneuver, there was excellent inflow.  I passed a  Fogarty one additional time without retaining anymore thrombus. The proximal artery was then occluded with a vessel loop.  I tried to advance a #3 Fogarty distally, however, I kept on going down the peroneal artery which appeared to be occluded in the mid thigh.  I tried to direct into the posterior tibial and anterior tibial artery, but had limited success with this.  There was however, sufficient backbleeding. I therefore elected to flush proximally and distally and I then closed the arteriotomy with running 6-0 Prolene.  I then evaluated signals in the foot.  The patient had a brisk posterior tibial and anterior Doppler signal.  I was satisfied with these results.  Because of the patient's ischemia time, I felt  that I needed to proceed with four compartment fasciotomies.  I opened up the deep compartment throughout the width of the incision.  I then used Mayo scissors to open the posterior, the superficial fundus to open the superficial posterior compartment down to the ankle.  Interrupted 3-0 nylon mattress sutures were placed for delayed primary closure.  I then made a lateral incision between the tibia and fibula.  I then released the lateral and anterior compartments with curved Mayo scissors.  All of the muscle appeared viable.  Again, delayed primary closure was performed with interrupted 3-0 nylon mattress sutures.  The wound was then packed with moist gauze, Ray-Tec, ABDs and wrapped in a Ace wrap.  I did reapproximate the gastrocnemius muscle to give coverage over the artery.  The patient tolerated the procedure well, was successfully extubated, and taken to recovery room in stable condition.     Jorge Ny, MD     VWB/MEDQ  D:  02/05/2011  T:  02/06/2011  Job:  [id number]  Electronically Signed by Arelia Longest IV MD on 02/10/2011 10:11:52 PM

## 2011-02-10 NOTE — H&P (Signed)
[name], [name]              ACCOUNT NO.:  [id number]  MEDICAL RECORD NO.:  [id number]           PATIENT TYPE:  I  LOCATION:  3312                         FACILITY:  [facility]  PHYSICIAN:  Juleen China IV, MDDATE OF BIRTH:  October 17, 1950  DATE OF ADMISSION:  02/05/2011 DATE OF DISCHARGE:                             HISTORY & PHYSICAL   DIAGNOSIS:  Left leg ischemia.  CHIEF COMPLAINTS:  Left leg pain and numbness.  HISTORY OF PRESENT ILLNESS:  [name] is a 60 year old gentleman who was called by the [facility] Emergency Department with concerns of acute arterial ischemia to the left leg.  The patient awoke this morning with numbness and pain in his left leg.  He could not feel his left leg.  It began at 6 a.m.  There were no aggravating or relieving factors.  He even pocked a needle into his foot and did not feel it.  This is what prompted him to go to the emergency department.  While there, they were unable to obtain Doppler signals in the left foot and for that reason contacted me and I had him brought to the holding area in the [facility] Operating Room.  The patient states that he can barely move the foot and that he has no feeling in the left leg.  He has never had any event like this before.  The patient has recently been hospitalized in February with abdominal pain and bloody stools with a severe upper GI bleed likely secondary to hepatic aneurysm which has been "embolized."  The patient had been on Coumadin at that time for a DVT in the left leg.  He is no longer on Coumadin.  The patient has cardiomyopathy with open heart surgery in 1971.  He is not having any chest pain or shortness of breath at this time.  REVIEW OF SYSTEMS:  Negative except for above.  PAST MEDICAL HISTORY: 1. Hepatic aneurysm. 2. Status post coil embolization. 3. Cardiomyopathy with open heart surgery in 1971. 4. DVT in July 2011.  FAMILY HISTORY:  The patient denies any contributing  family history.  SOCIAL HISTORY:  He continues to smoke and drink daily medication.  Home medication include blood pressure medication.  Allergies are none.  PHYSICAL EXAMINATION:  VITAL SIGNS:  The patient is afebrile with a systolic blood pressure in the 150s, respiratory rate is 16. GENERAL:  He is well appearing in mild distress. HEENT:  Within normal limits. LUNGS:  Clear bilaterally. CARDIOVASCULAR:  Regular rate and rhythm. ABDOMEN:  Soft, nontender. EXTREMITIES:  He has palpable femoral pulses, 3+ on the right, 2+ on the left.  He has a palpable right popliteal and right posterior tibial pulse and cannot palpate or Doppler a signal in the left leg in the popliteal space or at the ankle.  There is no edema. MUSCULOSKELETAL:  No major deformities. NEUROLOGIC:  The patient has significantly decreased motor function in the left foot and minimal to no sensation in the left foot.  He can barely move his ankle and barely wiggle his toes. SKIN:  Without rash.  ASSESSMENT AND PLAN:  This is  a 60 year old gentleman with acute leftleg ischemia.  The patient most likely has embolic occlusion of his left femoral artery.  For that reason, I think he needs to go emergently to the operating room.  His leg has been ischemic for greater than 6 hours. He has minimal motor sensory function of the left leg.  This is a limb threatening situation.  I discussed this with the patient that we need to go emergently to the operating room to restore blood flow and most likely proceed with fasciotomies.     Jorge Ny, MD     VWB/MEDQ  D:  02/05/2011  T:  02/06/2011  Job:  [id number]  Electronically Signed by Arelia Longest IV MD on 02/10/2011 10:11:49 PM

## 2011-02-11 ENCOUNTER — Ambulatory Visit: Payer: Self-pay | Admitting: Adult Health

## 2011-02-11 ENCOUNTER — Ambulatory Visit: Payer: Self-pay | Admitting: Physician Assistant

## 2011-02-11 LAB — CBC
MCH: 28.1 pg (ref 26.0–34.0)
MCHC: 33.9 g/dL (ref 30.0–36.0)
Platelets: 277 10*3/uL (ref 150–400)
RBC: 4.3 MIL/uL (ref 4.22–5.81)

## 2011-02-11 LAB — BASIC METABOLIC PANEL
CO2: 29 mEq/L (ref 19–32)
Calcium: 9.6 mg/dL (ref 8.4–10.5)
Creatinine, Ser: 0.92 mg/dL (ref 0.4–1.5)
Glucose, Bld: 108 mg/dL — ABNORMAL HIGH (ref 70–99)

## 2011-02-11 LAB — PROTIME-INR: Prothrombin Time: 13.9 seconds (ref 11.6–15.2)

## 2011-02-12 LAB — CBC
MCH: 28.1 pg (ref 26.0–34.0)
MCV: 83.6 fL (ref 78.0–100.0)
Platelets: 350 10*3/uL (ref 150–400)
RDW: 15 % (ref 11.5–15.5)

## 2011-02-12 LAB — ANTI-NUCLEAR AB-TITER (ANA TITER): ANA Titer 1: NEGATIVE

## 2011-02-13 LAB — PROTIME-INR
INR: 1.49 (ref 0.00–1.49)
Prothrombin Time: 18.2 seconds — ABNORMAL HIGH (ref 11.6–15.2)

## 2011-02-13 LAB — CBC
Hemoglobin: 11.9 g/dL — ABNORMAL LOW (ref 13.0–17.0)
MCHC: 34.5 g/dL (ref 30.0–36.0)
Platelets: 379 10*3/uL (ref 150–400)
RBC: 4.14 MIL/uL — ABNORMAL LOW (ref 4.22–5.81)

## 2011-02-13 LAB — HEPARIN LEVEL (UNFRACTIONATED)
Heparin Unfractionated: 0.37 IU/mL (ref 0.30–0.70)
Heparin Unfractionated: 0.87 IU/mL — ABNORMAL HIGH (ref 0.30–0.70)

## 2011-02-14 LAB — CBC
HCT: 34.9 % — ABNORMAL LOW (ref 39.0–52.0)
Hemoglobin: 11.9 g/dL — ABNORMAL LOW (ref 13.0–17.0)
MCH: 28.2 pg (ref 26.0–34.0)
MCHC: 34.1 g/dL (ref 30.0–36.0)

## 2011-02-14 LAB — PROTIME-INR: INR: 1.56 — ABNORMAL HIGH (ref 0.00–1.49)

## 2011-02-15 LAB — CBC
HCT: 36.5 % — ABNORMAL LOW (ref 39.0–52.0)
MCHC: 33.7 g/dL (ref 30.0–36.0)
MCV: 83 fL (ref 78.0–100.0)
RDW: 14.7 % (ref 11.5–15.5)
WBC: 7.3 10*3/uL (ref 4.0–10.5)

## 2011-02-15 LAB — PROTIME-INR: INR: 2.19 — ABNORMAL HIGH (ref 0.00–1.49)

## 2011-02-17 ENCOUNTER — Ambulatory Visit: Payer: Self-pay | Admitting: *Deleted

## 2011-02-17 ENCOUNTER — Telehealth: Payer: Self-pay | Admitting: Family Medicine

## 2011-02-17 ENCOUNTER — Encounter: Payer: Self-pay | Admitting: *Deleted

## 2011-02-17 DIAGNOSIS — Z954 Presence of other heart-valve replacement: Secondary | ICD-10-CM

## 2011-02-17 DIAGNOSIS — K5289 Other specified noninfective gastroenteritis and colitis: Secondary | ICD-10-CM

## 2011-02-17 DIAGNOSIS — I729 Aneurysm of unspecified site: Secondary | ICD-10-CM

## 2011-02-17 DIAGNOSIS — I82409 Acute embolism and thrombosis of unspecified deep veins of unspecified lower extremity: Secondary | ICD-10-CM

## 2011-02-17 DIAGNOSIS — Z7901 Long term (current) use of anticoagulants: Secondary | ICD-10-CM

## 2011-02-17 DIAGNOSIS — T826XXA Infection and inflammatory reaction due to cardiac valve prosthesis, initial encounter: Secondary | ICD-10-CM

## 2011-02-17 MED ORDER — OLMESARTAN MEDOXOMIL-HCTZ 40-12.5 MG PO TABS: 1.0000 | ORAL_TABLET | Freq: Every day | ORAL | Status: DC

## 2011-02-17 MED ORDER — PANTOPRAZOLE SODIUM 40 MG PO TBEC: 40.0000 mg | DELAYED_RELEASE_TABLET | Freq: Every day | ORAL | Status: DC

## 2011-02-17 NOTE — Discharge Summary (Addendum)
[name], [name]              ACCOUNT NO.:  [id number]  MEDICAL RECORD NO.:  [id number]           PATIENT TYPE:  I  LOCATION:  2013                         FACILITY:  [facility]  PHYSICIAN:  Juleen China IV, MDDATE OF BIRTH:  08-18-1951  DATE OF ADMISSION:  02/05/2011 DATE OF DISCHARGE:  02/15/2011                              DISCHARGE SUMMARY   CHIEF COMPLAINT:  Left superficial femoral embolus with ischemic left leg.  HISTORY OF PRESENT ILLNESS:  [name] is a 60 year old gentleman who had been on Coumadin in the past but this was stopped for "stomach surgery."  He is on Coumadin for a left lower extremity DVT, diagnosed in July 2011.  This has been followed by Dr. Syliva Overman in [place].  He on the day of admission came to the [facility] Emergency Department after awaking at 6 a.m. with numbness and pain in the left leg.  He was transferred to [facility] and seen by Dr. Myra Gianotti who felt that he had ischemic changes in the left leg which included lack of pulses and minimal motor and sensory function.  It was deemed that this was an emergency and he was taken straight to the operating room for a left superficial femoral embolectomy.  PAST MEDICAL HISTORY:  Significant for: 1. 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 2. Abdominal pain and GI bleed. 3. Bioprosthetic aortic valve placement in 1971. 4. Mild congestive heart failure. 5. History of EtOH abuse. 6. Tobacco abuse. 7. 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 8. Hypertension.  HOSPITAL COURSE:  The patient was taken to emergently to the operating room for left superficial femoral endarterectomy, below-knee popliteal artery exposure, and four compartment fasciotomy.  Postoperatively, the patient was maintained on IV heparin drip and Coumadin was started.  The fasciotomy was closed at the bedside on Feb 08, 2011.  His INRs were followed daily and by Feb 15, 2011, his INR  was greater than 2.  He was ambulating, voiding, and taking p.o.  His wounds were healing well.  He had minimal serous drainage on occasion from his fasciotomy wounds.  He will be discharged home and follow up with Dr. Myra Gianotti in 2 weeks.  DISCHARGE MEDICATIONS: 1. Coumadin 5 mg daily. 2. Aspirin 81 mg daily. 3. Oxycodone 5 mg 1-2 tablets every 6 hours as needed for pain. 4. Protonix 40 mg daily. 5. Benicar HCT 40/12.5 mg daily.  FINAL DIAGNOSES: 1. Thromboembolus of left superficial femoral artery status post     embolectomy and four compartment fasciotomies. 2. All of his chronic medical conditions were stable while in-house.  DISPOSITION:  The patient will be discharged home with follow up with Dr. [facility] on Tuesday, Feb 17, 2011,  to have his PT/INR checked.  He will follow up with Dr. Myra Gianotti in 2 weeks.     Della Goo, PA-C   ______________________________ Seth Bake. Charlena Cross, MD    RR/MEDQ  D:  02/15/2011  T:  02/15/2011  Job:  [id number]  Electronically Signed by Della Goo PA on 02/17/2011 12:14:42 PM Electronically Signed by Arelia Longest  IV MD on 02/25/2011 01:28:39 PM

## 2011-02-17 NOTE — Telephone Encounter (Signed)
Refills sent on meds

## 2011-02-18 ENCOUNTER — Other Ambulatory Visit: Payer: Self-pay | Admitting: *Deleted

## 2011-02-18 ENCOUNTER — Telehealth: Payer: Self-pay | Admitting: Family Medicine

## 2011-02-18 MED ORDER — WARFARIN SODIUM 5 MG PO TABS: 5.0000 mg | ORAL_TABLET | ORAL | Status: DC

## 2011-02-18 NOTE — Telephone Encounter (Signed)
Advised he needed to contact the coumadin clinic and gave him the number

## 2011-02-25 ENCOUNTER — Ambulatory Visit (INDEPENDENT_AMBULATORY_CARE_PROVIDER_SITE_OTHER): Payer: Self-pay

## 2011-02-25 ENCOUNTER — Ambulatory Visit (INDEPENDENT_AMBULATORY_CARE_PROVIDER_SITE_OTHER): Payer: Self-pay | Admitting: *Deleted

## 2011-02-25 DIAGNOSIS — I82409 Acute embolism and thrombosis of unspecified deep veins of unspecified lower extremity: Secondary | ICD-10-CM

## 2011-02-25 DIAGNOSIS — Z7901 Long term (current) use of anticoagulants: Secondary | ICD-10-CM

## 2011-02-25 DIAGNOSIS — T79A29A Traumatic compartment syndrome of unspecified lower extremity, initial encounter: Secondary | ICD-10-CM

## 2011-02-25 DIAGNOSIS — Z954 Presence of other heart-valve replacement: Secondary | ICD-10-CM

## 2011-02-25 LAB — POCT INR: INR: 2

## 2011-02-25 NOTE — Assessment & Plan Note (Signed)
OFFICE VISIT  [name], [name] DOB:  30-Mar-1951                                       02/25/2011 JXBJY#:[id number]  HISTORY OF PRESENT ILLNESS:  Patient is a 60 year old gentleman with a history of DVT in the past who is also on chronic Coumadin for valve replacement and DVT.  He had been taken off of his Coumadin prior to "stomach surgery" and developed a thrombosis in his popliteal and tibial vessels in the left lower extremity.  He was hospitalized and taken to the operating room by Dr. Myra Gianotti for left popliteal and tibial thrombectomies and 4 compartment fasciotomies.  He did well postop, and he was recently discharged.  He follows up today to have his sutures removed from his fasciotomy wounds.  He states the wounds have been healing well.  He had minimal drainage from a couple of the sites but has been keeping it clean and the drainage has stopped.  He denies any fever or chills and no pain in the incisions.  He is walking with crutches at this point.  PHYSICAL EXAMINATION:  This is a thin male in no acute distress.  His heart rate is 70.  His sats are 98%.  His respiratory rate is 12.  His left lower extremity:  His foot is warm and pink.  He had palpable DP and PT pulses in the foot.  His sutures were removed without difficulty, and his wounds were healing well.  He had good sensation and motion in the foot.  ASSESSMENT/PLAN:  Well-healed fasciotomy wounds with palpable pulses in this patient who had thrombosed popliteal and tibial vessels off Coumadin.  The plan is to have him follow up as needed.  He will continue his Coumadin and be followed by his primary care physician for this.  Della Goo, PA-C  [place]. Edilia Bo, M.D. Electronically Signed  RR/MEDQ  D:  02/25/2011  T:  02/25/2011  Job:  [id number]

## 2011-03-01 NOTE — Consult Note (Signed)
[name], [name] NO.:  [id number]  MEDICAL RECORD NO.:  [id number]           PATIENT TYPE:  I  LOCATION:  2013                         FACILITY:  [facility]  PHYSICIAN:  [name], M.D.DATE OF BIRTH:  06/09/1951  DATE OF CONSULTATION: DATE OF DISCHARGE:                                CONSULTATION   [name] is a 60 year old [name] man referred by Dr. Myra Zhang for evaluation in the setting of recurrent ischemia.  The patient has a history of left lower extremity deep venous thrombus diagnosed on July 2011 through a venous ultrasound.  This involved the right common femoral, superficial correction and then some superficial venous thrombosis in the midthigh greater saphenous.  The patient was heparinized, transitioned to Coumadin and according to his recollection, he received Coumadin for approximately 6 months, monitored through Dr. Claris Che Zhang's office in [name].  At this time, the patient presented with what proved to be thrombus involving the superficial femoral artery again in the left leg.  The patient underwent left superficial correction.  The presenting symptoms and evaluation are well documented in Dr. Estanislado Zhang note.  The patient underwent left superficial femoral embolectomy on Feb 05, 2011, with four compartment fasciotomy and is making an uneventful recovery.  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  The patient also is status post bioprosthetic aortic valve replacement in 1971.  He had a workup for possible vegetations during his February 2012 admission including transthoracic  and transesophageal echocardiogram with no evidence of agitations noted. The patient does have a history of mild congestive heart failure with an ejection fraction in the 45% plus or minus 5 range as well as mild diffuse hypokinesis.  There is a history of EtOH abuse.  The patient drinking between 2 and 4 40 ounces beers daily and a history of ongoing smoking abuse.  The patient currently smoking half a pack every 2 days, but more frequently one pack daily.  A detailed review of systems was otherwise significant for congenital blindness of the left eye and hypertension.  There is no history of diabetes.  FAMILY HISTORY:  The patient's father died in his 91s from cirrhosis of the liver.  The patient's mother died in her 73s from complications of diabetes.  The patient has one sister who is 17 years old.  Her name is [name].  She lives in [place], and her phone number is [phone].  In case of an event where the patient was unable to make medical decision, he expresses a desire to have [name] be his health care power of attorney.  He has not, however, completed the documents to date.  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  He lives with a significant other [name] who also is disabled.  She has no children.  The patient was married but is separated, and he tells me his former wife has a restraining order against him.  They had no children together; however, he has two children from a prior relationships, a son who is now 68, a daughter who is 64, both were adopted, and he would not know how to contact them he tells me if he wanted to.  ALLERGIES:  He has no known drug allergies.  MEDICATIONS:  His current medications include: 1. Aspirin 325 mg daily. 2. Heparin as per protocol. 3. Hydrochlorothiazide 12.5 mg daily. 4. Olmesartan 40 mg daily. 5.  Coumadin is being transitioned, and he has Tylenol and tramadol as     p.r.n. pain medications.  REVIEW OF SYSTEMS:  Currently, denies unusual headaches, denies history of strokes or seizures.  Denies acute changes in his vision.  Denies nausea or vomiting.  He denies taste alteration, significant reflux, or change in bowel or bladder habits.  There has been no further problems with GI bleeding since the February admission.  He denies cough, phlegm production, or pleurisy.  He denies shortness of breath with normal activities of daily living.  PHYSICAL EXAMINATION:  VITAL SIGNS:  He is well-developed [place] American male who is 5 feet 3-1/2 inches tall and weighs 163 pounds. Temperature is 98.2, pulse 86, respiratory rate 20, blood pressure 120/86, and his room air saturation is 95%. NECK:  I do not palpate any peripheral adenopathy. LUNGS:  No crackles or wheezes. HEART:  Regular rate and rhythm.  No murmur appreciated. ABDOMEN:  Soft, nontender.  Bowel sounds are positive. MUSCULOSKELETAL:  The left lower extremity is wrapped below the knee, but the foot is warm, and there is a dorsal palpable pulse. NEUROLOGIC:  Nonfocal.  LABORATORY DATA:  This admission include a PT/INR today of 0.86, a heparin level of less than 0.1.  White cell count of 6.7, hemoglobin 12.3, and platelets 192,000.  Creatinine 0.76.  Other labs of importance includes a negative hepatitis B and C panel, and a negative HIV antibody panel in February 2012.  DIC panel at that time showed no schistocytes. A PSA obtained at the same time was 2.12.  FILMS:  CT angiogram of the abdomen and pelvis Jan 30, 2011, is significant chiefly for a nonobstructing small left inguinal hernia. The right hepatic arterial embolization site is without apparent complication.  He has patchy areas of decrease enhancement and parenchymal loss in both kidneys, and there is mild prostatic enlargement.  The common iliacs are ectatic, and  there is eccentric calcified plaque without stenosis at the aortic bifurcation.  The abdominal aorta is tortuous with mild scattered calcified plaque.  There is no significant atheromatous irregularity dissection aneurysm or stenosis.  There is currently no evidence of aneurysmal opacification or extravasation.  IMPRESSION AND PLAN:  A 60 year old [place] man with a history of both venous (July 2011) and arterial (May 2012) clots, with a history of bioprosthetic aortic valve, but a clear transesophageal echocardiogram February 2012 with a history of hepatic pseudoaneurysm status post coil embolization in February 2012, and evidence of possible emboli to the spleen and kidneys per prior CT scans.  The patient had a negative hypercoagulable panel, February 2012 specifically showing no evidence of the lupus anticoagulant or antiphospholipid antibody syndrome.  I am not 100% sure of the cause of Mr. Valbuena arterial and venous clots, but Buerger disease  is certainly in the differential, and I have urged him to completely avoid tobacco products in the future.  In terms of treatment, my recommendation is for lifelong anticoagulation specifically with warfarin plus aspirin.  The patient tells me that he took aspirin for 6 months in 2011 without event, and that his PT/INR were followed through Dr. Claris Che Zhang's office in [place].  I think a similar arrangement should be made at the time of discharge.  I have discussed all this with the patient in detail.  I have also urged him to complete health care power of attorney document, which he may be able to do in the course of this hospitalization.     [name], M.D.     Ronna Polio  D:  02/09/2011  T:  02/10/2011  Job:  [id number]  cc:   Jorge Ny, MD Acey Lav, MD Milus Mallick Lodema Hong, M.D.  Electronically Signed by Ruthann Cancer M.D. on 03/01/2011 11:45:45 AM

## 2011-03-05 ENCOUNTER — Ambulatory Visit (INDEPENDENT_AMBULATORY_CARE_PROVIDER_SITE_OTHER): Payer: Self-pay | Admitting: *Deleted

## 2011-03-05 DIAGNOSIS — Z954 Presence of other heart-valve replacement: Secondary | ICD-10-CM

## 2011-03-05 DIAGNOSIS — Z7901 Long term (current) use of anticoagulants: Secondary | ICD-10-CM

## 2011-03-05 DIAGNOSIS — I82409 Acute embolism and thrombosis of unspecified deep veins of unspecified lower extremity: Secondary | ICD-10-CM

## 2011-03-05 LAB — POCT INR: INR: 2.4

## 2011-03-18 NOTE — Telephone Encounter (Signed)
His films look great. He can come off his antibiotics

## 2011-03-23 ENCOUNTER — Ambulatory Visit (INDEPENDENT_AMBULATORY_CARE_PROVIDER_SITE_OTHER): Payer: Self-pay | Admitting: *Deleted

## 2011-03-23 DIAGNOSIS — Z7901 Long term (current) use of anticoagulants: Secondary | ICD-10-CM

## 2011-03-23 DIAGNOSIS — I82409 Acute embolism and thrombosis of unspecified deep veins of unspecified lower extremity: Secondary | ICD-10-CM

## 2011-03-23 DIAGNOSIS — Z954 Presence of other heart-valve replacement: Secondary | ICD-10-CM

## 2011-03-23 LAB — POCT INR: INR: 2.5

## 2011-04-02 ENCOUNTER — Encounter: Payer: Self-pay | Admitting: Family Medicine

## 2011-04-10 ENCOUNTER — Encounter: Payer: Self-pay | Admitting: Family Medicine

## 2011-04-10 ENCOUNTER — Ambulatory Visit (INDEPENDENT_AMBULATORY_CARE_PROVIDER_SITE_OTHER): Payer: Self-pay | Admitting: Family Medicine

## 2011-04-10 VITALS — BP 120/80 | HR 66 | Resp 16 | Ht 64.0 in | Wt 119.1 lb

## 2011-04-10 DIAGNOSIS — F17200 Nicotine dependence, unspecified, uncomplicated: Secondary | ICD-10-CM

## 2011-04-10 DIAGNOSIS — I10 Essential (primary) hypertension: Secondary | ICD-10-CM | POA: Insufficient documentation

## 2011-04-10 MED ORDER — TRIAMTERENE-HCTZ 37.5-25 MG PO CAPS: 1.0000 | ORAL_CAPSULE | Freq: Every day | ORAL | Status: DC

## 2011-04-10 NOTE — Assessment & Plan Note (Signed)
Controlled, but unable to afford medication, will change to triamterene

## 2011-04-10 NOTE — Patient Instructions (Signed)
F/u in 2 months.  New medication which is affordable for blood pressure sent to your pharmacy   I am happy that you are doing better.  You need to stop smoking.  Continue to stay in touch with social services for medicare/medicaid benefits

## 2011-04-11 NOTE — Assessment & Plan Note (Signed)
Counseled to quit , no plan of setting a date at this time

## 2011-04-11 NOTE — Progress Notes (Signed)
  Subjective:    Patient ID: [name], male    DOB: [birth year], 60 y.o.   MRN: [id number]  HPI The PT is here for follow up and re-evaluation of chronic medical conditions, medication management and review of recent lab and radiology data.   The PT denies any adverse reactions to current medications since the last visit.  C/O inability to afford blood pressure medicine  And is requesting an alternate medication  There are no specific complaints . He checks his coumadin regularly a the clinic, and denies epistaxis, rectal bleeding or hemoptysis. He still smokes and is unwilling to comiting to quitting at this time      Review of Systems Denies recent fever or chills. Denies sinus pressure, nasal congestion, ear pain or sore throat. Denies productive cough or wheezing. Denies chest pains, palpitations, paroxysmal nocturnal dyspnea, orthopnea and leg swelling Denies abdominal pain, nausea, vomiting,diarrhea or constipation.  Denies rectal bleeding or change in bowel movement. Denies dysuria, frequency, hesitancy or incontinence. Denies joint pain, swelling and limitation in mobility. Denies headaches, seizure, numbness, or tingling. Denies depression, anxiety or insomnia. Denies skin break down or rash.        Objective:   Physical Exam Patient alert and oriented and in no Cardiopulmonary distress.  HEENT: No facial asymmetry, EOMI, no sinus tenderness, .  Neck supple no adenopathy.  Chest: Clear to auscultation bilaterally.Decreased air entry bilaterally  CVS: S1, S2 systolic murmur, no S3.  ABD: Soft non tender. Bowel sounds normal.  Ext: No edema  MS: Adequate ROM spine, shoulders, hips and knees.  Skin: Intact, no ulcerations or rash noted.  Psych: Good eye contact, normal affect. Memory intact not anxious or depressed appearing.  CNS: CN 2-12 intact, power, tone and sensation normal throughout.        Assessment & Plan:

## 2011-04-16 NOTE — Progress Notes (Signed)
This encounter was created in error - please disregard.

## 2011-04-20 ENCOUNTER — Ambulatory Visit (INDEPENDENT_AMBULATORY_CARE_PROVIDER_SITE_OTHER): Payer: Self-pay | Admitting: *Deleted

## 2011-04-20 DIAGNOSIS — Z7901 Long term (current) use of anticoagulants: Secondary | ICD-10-CM

## 2011-04-20 DIAGNOSIS — Z954 Presence of other heart-valve replacement: Secondary | ICD-10-CM

## 2011-04-20 DIAGNOSIS — I82409 Acute embolism and thrombosis of unspecified deep veins of unspecified lower extremity: Secondary | ICD-10-CM

## 2011-05-18 ENCOUNTER — Ambulatory Visit (INDEPENDENT_AMBULATORY_CARE_PROVIDER_SITE_OTHER): Payer: Self-pay | Admitting: *Deleted

## 2011-05-18 DIAGNOSIS — Z954 Presence of other heart-valve replacement: Secondary | ICD-10-CM

## 2011-05-18 DIAGNOSIS — Z7901 Long term (current) use of anticoagulants: Secondary | ICD-10-CM

## 2011-05-18 DIAGNOSIS — I82409 Acute embolism and thrombosis of unspecified deep veins of unspecified lower extremity: Secondary | ICD-10-CM

## 2011-06-10 ENCOUNTER — Encounter: Payer: Self-pay | Admitting: Family Medicine

## 2011-06-11 ENCOUNTER — Encounter: Payer: Self-pay | Admitting: Family Medicine

## 2011-06-11 ENCOUNTER — Ambulatory Visit (INDEPENDENT_AMBULATORY_CARE_PROVIDER_SITE_OTHER): Payer: Self-pay | Admitting: Family Medicine

## 2011-06-11 VITALS — BP 158/100 | HR 70 | Resp 16 | Ht 64.0 in | Wt 124.1 lb

## 2011-06-11 DIAGNOSIS — Z23 Encounter for immunization: Secondary | ICD-10-CM

## 2011-06-11 DIAGNOSIS — I10 Essential (primary) hypertension: Secondary | ICD-10-CM

## 2011-06-11 MED ORDER — TRIAMTERENE-HCTZ 75-50 MG PO TABS: 1.0000 | ORAL_TABLET | Freq: Every day | ORAL | Status: DC

## 2011-06-11 NOTE — Patient Instructions (Signed)
F/u in 3 months.  Flu vaccine today.  Start higher dose of blood pressure medication today since your pressure is still too high. Take TWO of the tablets which you have once daily until done. The new script is for a higher strength and will be ONE tablet daily

## 2011-06-12 NOTE — Assessment & Plan Note (Signed)
Improved however still not at goal, dose increase on the current med.

## 2011-06-12 NOTE — Progress Notes (Signed)
  Subjective:    Patient ID: [name], male    DOB: [birth year], 60 y.o.   MRN: [id number]  HPI The PT is here for follow up and re-evaluation of chronic medical conditions, medication management and review of any available recent lab and radiology data.  The PT denies any adverse reactions to current medications since the last visit.  There are no new concerns.  There are no specific complaints       Review of Systems Denies recent fever or chills. Denies sinus pressure, nasal congestion, ear pain or sore throat. Denies chest congestion, productive cough or wheezing. Denies chest pains, palpitations and leg swelling Denies abdominal pain, nausea, vomiting,diarrhea or constipation.   Denies dysuria, frequency, hesitancy or incontinence. C/o occasional left knee pain and limitation in mobility Denies headaches, seizures, numbness, or tingling .Denies depression, anxiety or insomnia. Denies skin break down or rash.        Objective:   Physical Exam Patient alert and oriented and in no cardiopulmonary distress.  HEENT: No facial asymmetry, EOMI, no sinus tenderness,  oropharynx pink and moist.  Neck supple no adenopathy.  Chest: Clear to auscultation bilaterally.  CVS: S1, S2 ,systolic murmur no S3.  ABD: Soft non tender. Bowel sounds normal.  Ext: No edema  MS: Adequate ROM spine, shoulders, hips and reduced  ROM knee.  Skin: Intact, no ulcerations or rash noted.  Psych: Good eye contact, normal affect. Memory intact not anxious or depressed appearing.  CNS: CN 2-12 intact, power, tone and sensation normal throughout.        Assessment & Plan:

## 2011-06-15 ENCOUNTER — Ambulatory Visit (INDEPENDENT_AMBULATORY_CARE_PROVIDER_SITE_OTHER): Payer: Self-pay | Admitting: *Deleted

## 2011-06-15 DIAGNOSIS — Z7901 Long term (current) use of anticoagulants: Secondary | ICD-10-CM

## 2011-06-15 DIAGNOSIS — I82409 Acute embolism and thrombosis of unspecified deep veins of unspecified lower extremity: Secondary | ICD-10-CM

## 2011-06-15 DIAGNOSIS — Z954 Presence of other heart-valve replacement: Secondary | ICD-10-CM

## 2011-06-15 LAB — POCT INR: INR: 4.6

## 2011-06-26 ENCOUNTER — Telehealth: Payer: Self-pay | Admitting: Family Medicine

## 2011-06-26 NOTE — Telephone Encounter (Signed)
Thought he needed more of his maxzide but he had ran out of the lower dose and was prescribed a higher dose which he had. Understands he is to take ONE daily now instead of TWO daily of the lower dose

## 2011-07-01 ENCOUNTER — Ambulatory Visit (INDEPENDENT_AMBULATORY_CARE_PROVIDER_SITE_OTHER): Payer: Self-pay | Admitting: *Deleted

## 2011-07-01 DIAGNOSIS — I82409 Acute embolism and thrombosis of unspecified deep veins of unspecified lower extremity: Secondary | ICD-10-CM

## 2011-07-01 DIAGNOSIS — Z7901 Long term (current) use of anticoagulants: Secondary | ICD-10-CM

## 2011-07-01 DIAGNOSIS — Z954 Presence of other heart-valve replacement: Secondary | ICD-10-CM

## 2011-07-29 ENCOUNTER — Ambulatory Visit (INDEPENDENT_AMBULATORY_CARE_PROVIDER_SITE_OTHER): Payer: Self-pay | Admitting: *Deleted

## 2011-07-29 DIAGNOSIS — I82409 Acute embolism and thrombosis of unspecified deep veins of unspecified lower extremity: Secondary | ICD-10-CM

## 2011-07-29 DIAGNOSIS — Z954 Presence of other heart-valve replacement: Secondary | ICD-10-CM

## 2011-07-29 DIAGNOSIS — Z7901 Long term (current) use of anticoagulants: Secondary | ICD-10-CM

## 2011-08-27 ENCOUNTER — Encounter: Payer: Self-pay | Admitting: *Deleted

## 2011-08-27 ENCOUNTER — Ambulatory Visit (INDEPENDENT_AMBULATORY_CARE_PROVIDER_SITE_OTHER): Payer: Medicaid Other | Admitting: *Deleted

## 2011-08-27 DIAGNOSIS — Z7901 Long term (current) use of anticoagulants: Secondary | ICD-10-CM

## 2011-08-27 DIAGNOSIS — I82409 Acute embolism and thrombosis of unspecified deep veins of unspecified lower extremity: Secondary | ICD-10-CM

## 2011-08-27 DIAGNOSIS — Z954 Presence of other heart-valve replacement: Secondary | ICD-10-CM

## 2011-09-11 ENCOUNTER — Ambulatory Visit: Payer: Self-pay | Admitting: Family Medicine

## 2011-09-21 ENCOUNTER — Encounter: Payer: Self-pay | Admitting: Family Medicine

## 2011-09-24 ENCOUNTER — Ambulatory Visit (INDEPENDENT_AMBULATORY_CARE_PROVIDER_SITE_OTHER): Payer: Medicaid Other | Admitting: *Deleted

## 2011-09-24 DIAGNOSIS — I82409 Acute embolism and thrombosis of unspecified deep veins of unspecified lower extremity: Secondary | ICD-10-CM

## 2011-09-24 DIAGNOSIS — Z954 Presence of other heart-valve replacement: Secondary | ICD-10-CM

## 2011-09-24 DIAGNOSIS — Z7901 Long term (current) use of anticoagulants: Secondary | ICD-10-CM

## 2011-09-24 LAB — POCT INR: INR: 3.7

## 2011-09-29 ENCOUNTER — Encounter: Payer: Self-pay | Admitting: Family Medicine

## 2011-09-29 ENCOUNTER — Ambulatory Visit: Payer: Self-pay | Admitting: Family Medicine

## 2011-10-06 ENCOUNTER — Encounter: Payer: Self-pay | Admitting: Family Medicine

## 2011-10-08 ENCOUNTER — Ambulatory Visit (INDEPENDENT_AMBULATORY_CARE_PROVIDER_SITE_OTHER): Payer: Self-pay | Admitting: Family Medicine

## 2011-10-08 ENCOUNTER — Encounter: Payer: Self-pay | Admitting: Family Medicine

## 2011-10-08 VITALS — BP 160/102 | HR 68 | Resp 16 | Ht 64.0 in | Wt 130.4 lb

## 2011-10-08 DIAGNOSIS — R5383 Other fatigue: Secondary | ICD-10-CM

## 2011-10-08 DIAGNOSIS — Z125 Encounter for screening for malignant neoplasm of prostate: Secondary | ICD-10-CM

## 2011-10-08 DIAGNOSIS — J40 Bronchitis, not specified as acute or chronic: Secondary | ICD-10-CM

## 2011-10-08 DIAGNOSIS — R5381 Other malaise: Secondary | ICD-10-CM

## 2011-10-08 DIAGNOSIS — Z79899 Other long term (current) drug therapy: Secondary | ICD-10-CM

## 2011-10-08 DIAGNOSIS — R030 Elevated blood-pressure reading, without diagnosis of hypertension: Secondary | ICD-10-CM

## 2011-10-08 MED ORDER — BENZONATATE 100 MG PO CAPS: 100.0000 mg | ORAL_CAPSULE | Freq: Four times a day (QID) | ORAL | Status: DC | PRN

## 2011-10-08 MED ORDER — PENICILLIN V POTASSIUM 500 MG PO TABS: 500.0000 mg | ORAL_TABLET | Freq: Three times a day (TID) | ORAL | Status: AC

## 2011-10-08 NOTE — Assessment & Plan Note (Signed)
Uncontrolled due to non compliance.

## 2011-10-08 NOTE — Patient Instructions (Addendum)
F/u in 4 months.  Fasting labs in 4 months.   Medication is sent in for bronchitis.   You meed to take your blood pressure medication every day.  You will be referred for a colonoscopy at the next visit

## 2011-10-12 NOTE — Assessment & Plan Note (Signed)
Acute episode, antibiotic and decongestants prescribed

## 2011-10-12 NOTE — Progress Notes (Signed)
  Subjective:    Patient ID: [name], male    DOB: [birth year], 61 y.o.   MRN: [id number]  HPI The PT is here for follow up and re-evaluation of chronic medical conditions, medication management and review of any available recent lab and radiology data.  Preventive health is updated, specifically  Cancer screening and Immunization.   Questions or concerns regarding consultations or procedures which the PT has had in the interim are  addressed. The PT denies any adverse reactions to current medications since the last visit.  2 week h/o increased chest congestion with cough productive of thick sputum which is yellow, also intermittent chills. Non compliant with bP med, states no transport available to get the med    Review of Systems See HPI Denies recent fever or chills. Denies sinus pressure, nasal congestion, ear pain or sore throat. Denies chest pains, palpitations and leg swelling Denies abdominal pain, nausea, vomiting,diarrhea or constipation.   Denies dysuria, frequency, hesitancy or incontinence. Denies joint pain, swelling and limitation in mobility. Denies headaches, seizures, numbness, or tingling. Denies depression, anxiety or insomnia. Denies skin break down or rash.        Objective:   Physical Exam  Patient alert and oriented and in no cardiopulmonary distress.  HEENT: No facial asymmetry, EOMI, no sinus tenderness,  oropharynx pink and moist.  Neck supple no adenopathy.  Chest: decreased air entry, scattered crackles, no wheezes  CVS: S1, S2 systolic  murmur, no S3.  ABD: Soft non tender. Bowel sounds normal.  Ext: No edema  MS: Adequate ROM spine, shoulders, hips and knees.  Skin: Intact, no ulcerations or rash noted.  Psych: Good eye contact, normal affect. Memory intact not anxious or depressed appearing.  CNS: CN 2-12 intact, power, tone and sensation normal throughout.       Assessment & Plan:

## 2011-10-22 ENCOUNTER — Ambulatory Visit (INDEPENDENT_AMBULATORY_CARE_PROVIDER_SITE_OTHER): Payer: Self-pay | Admitting: *Deleted

## 2011-10-22 DIAGNOSIS — Z7901 Long term (current) use of anticoagulants: Secondary | ICD-10-CM

## 2011-10-22 DIAGNOSIS — Z954 Presence of other heart-valve replacement: Secondary | ICD-10-CM

## 2011-10-22 DIAGNOSIS — I82409 Acute embolism and thrombosis of unspecified deep veins of unspecified lower extremity: Secondary | ICD-10-CM

## 2011-11-05 ENCOUNTER — Ambulatory Visit (INDEPENDENT_AMBULATORY_CARE_PROVIDER_SITE_OTHER): Payer: Self-pay | Admitting: *Deleted

## 2011-11-05 DIAGNOSIS — I82409 Acute embolism and thrombosis of unspecified deep veins of unspecified lower extremity: Secondary | ICD-10-CM

## 2011-11-05 DIAGNOSIS — Z954 Presence of other heart-valve replacement: Secondary | ICD-10-CM

## 2011-11-05 DIAGNOSIS — Z7901 Long term (current) use of anticoagulants: Secondary | ICD-10-CM

## 2011-11-05 LAB — POCT INR: INR: 2.4

## 2011-11-21 ENCOUNTER — Other Ambulatory Visit: Payer: Self-pay | Admitting: Cardiology

## 2011-11-26 ENCOUNTER — Ambulatory Visit (INDEPENDENT_AMBULATORY_CARE_PROVIDER_SITE_OTHER): Payer: Medicaid Other | Admitting: *Deleted

## 2011-11-26 DIAGNOSIS — I82409 Acute embolism and thrombosis of unspecified deep veins of unspecified lower extremity: Secondary | ICD-10-CM

## 2011-11-26 DIAGNOSIS — Z954 Presence of other heart-valve replacement: Secondary | ICD-10-CM

## 2011-11-26 DIAGNOSIS — Z7901 Long term (current) use of anticoagulants: Secondary | ICD-10-CM

## 2011-11-26 LAB — POCT INR: INR: 3.4

## 2011-11-26 MED ORDER — WARFARIN SODIUM 5 MG PO TABS: 5.0000 mg | ORAL_TABLET | Freq: Every day | ORAL | Status: DC

## 2011-12-17 ENCOUNTER — Ambulatory Visit (INDEPENDENT_AMBULATORY_CARE_PROVIDER_SITE_OTHER): Payer: Medicaid Other | Admitting: *Deleted

## 2011-12-17 DIAGNOSIS — I82409 Acute embolism and thrombosis of unspecified deep veins of unspecified lower extremity: Secondary | ICD-10-CM

## 2011-12-17 DIAGNOSIS — Z7901 Long term (current) use of anticoagulants: Secondary | ICD-10-CM

## 2011-12-17 DIAGNOSIS — Z954 Presence of other heart-valve replacement: Secondary | ICD-10-CM

## 2011-12-17 LAB — POCT INR: INR: 1.5

## 2012-01-06 ENCOUNTER — Encounter: Payer: Self-pay | Admitting: Family Medicine

## 2012-01-06 ENCOUNTER — Ambulatory Visit (INDEPENDENT_AMBULATORY_CARE_PROVIDER_SITE_OTHER): Payer: Self-pay | Admitting: *Deleted

## 2012-01-06 ENCOUNTER — Ambulatory Visit (INDEPENDENT_AMBULATORY_CARE_PROVIDER_SITE_OTHER): Payer: Self-pay | Admitting: Family Medicine

## 2012-01-06 VITALS — BP 140/90 | HR 73 | Resp 16 | Ht 64.0 in | Wt 127.8 lb

## 2012-01-06 DIAGNOSIS — R030 Elevated blood-pressure reading, without diagnosis of hypertension: Secondary | ICD-10-CM

## 2012-01-06 DIAGNOSIS — I82409 Acute embolism and thrombosis of unspecified deep veins of unspecified lower extremity: Secondary | ICD-10-CM

## 2012-01-06 DIAGNOSIS — I10 Essential (primary) hypertension: Secondary | ICD-10-CM

## 2012-01-06 DIAGNOSIS — Z7901 Long term (current) use of anticoagulants: Secondary | ICD-10-CM

## 2012-01-06 DIAGNOSIS — Z954 Presence of other heart-valve replacement: Secondary | ICD-10-CM

## 2012-01-06 DIAGNOSIS — F1010 Alcohol abuse, uncomplicated: Secondary | ICD-10-CM

## 2012-01-06 MED ORDER — AMLODIPINE BESYLATE 2.5 MG PO TABS: 2.5000 mg | ORAL_TABLET | Freq: Every day | ORAL | Status: DC

## 2012-01-06 MED ORDER — TRIAMTERENE-HCTZ 75-50 MG PO TABS: 1.0000 | ORAL_TABLET | Freq: Every day | ORAL | Status: DC

## 2012-01-06 NOTE — Assessment & Plan Note (Signed)
On chronic coumadin, monitored by coumadin clinic monthly

## 2012-01-06 NOTE — Progress Notes (Signed)
  Subjective:    Patient ID: [name], male    DOB: 1951-03-13, 61 y.o.   MRN: [id number]  HPI Pt in for f/u of chronic problems. Has mild allergy symptoms, as expected, no fever, chills or green drainage or productive cough. Denies nicotine use, consumes 40 ounces alcohol/beer daily. Chronic joint pain and stiffness   Review of Systems See HPI Denies recent fever or chills. Mild  sinus pressure, nasal congestion,no  ear pain or sore throat. Denies chest congestion, productive cough or wheezing. Denies chest pains, palpitations and leg swelling Denies abdominal pain, nausea, vomiting,diarrhea or constipation.   Denies dysuria, frequency, hesitancy or incontinence. . Denies headaches, seizures, numbness, or tingling. Denies depression, anxiety or insomnia. Denies skin break down or rash.        Objective:   Physical Exam  Patient alert and oriented and in no cardiopulmonary distress.  HEENT: No facial asymmetry, EOMI, no sinus tenderness,  oropharynx pink and moist.  Neck supple no adenopathy.  Chest: Clear to auscultation bilaterally.decreased  throughout  CVS: S1, S2 no murmurs, no S3.  ABD: Soft non tender. Bowel sounds normal.  Ext: No edema  MS: decreased though adequate  ROM spine, shoulders, hips and knees.  Skin: Intact, no ulcerations or rash noted.  Psych: Good eye contact, normal affect. Memory mildly impairednot anxious or depressed appearing.  CNS: CN 2-12 intact, power, tone and sensation normal throughout.       Assessment & Plan:

## 2012-01-06 NOTE — Patient Instructions (Signed)
F/u in 4 .5 month  New additional medication for blood pressure . It is high. Amlodipine one daily, has been sent to [facility]  Please cut beer down from 40 ounces daily to 20, this is damaging your heart and liver

## 2012-01-06 NOTE — Assessment & Plan Note (Signed)
Uncontrolled, add amlodipine 

## 2012-01-06 NOTE — Assessment & Plan Note (Signed)
Reports 40 ounces daily, advised to reduce to 20

## 2012-01-27 ENCOUNTER — Ambulatory Visit (INDEPENDENT_AMBULATORY_CARE_PROVIDER_SITE_OTHER): Payer: Self-pay | Admitting: *Deleted

## 2012-01-27 DIAGNOSIS — Z7901 Long term (current) use of anticoagulants: Secondary | ICD-10-CM

## 2012-01-27 DIAGNOSIS — I82409 Acute embolism and thrombosis of unspecified deep veins of unspecified lower extremity: Secondary | ICD-10-CM

## 2012-01-27 DIAGNOSIS — Z954 Presence of other heart-valve replacement: Secondary | ICD-10-CM

## 2012-02-18 ENCOUNTER — Ambulatory Visit (INDEPENDENT_AMBULATORY_CARE_PROVIDER_SITE_OTHER): Payer: Self-pay | Admitting: *Deleted

## 2012-02-18 DIAGNOSIS — Z7901 Long term (current) use of anticoagulants: Secondary | ICD-10-CM

## 2012-02-18 DIAGNOSIS — Z954 Presence of other heart-valve replacement: Secondary | ICD-10-CM

## 2012-02-18 DIAGNOSIS — I82409 Acute embolism and thrombosis of unspecified deep veins of unspecified lower extremity: Secondary | ICD-10-CM

## 2012-02-18 LAB — POCT INR: INR: 2.5

## 2012-02-24 ENCOUNTER — Other Ambulatory Visit: Payer: Self-pay | Admitting: Cardiology

## 2012-03-17 ENCOUNTER — Ambulatory Visit (INDEPENDENT_AMBULATORY_CARE_PROVIDER_SITE_OTHER): Payer: Self-pay | Admitting: *Deleted

## 2012-03-17 DIAGNOSIS — Z7901 Long term (current) use of anticoagulants: Secondary | ICD-10-CM

## 2012-03-17 DIAGNOSIS — Z954 Presence of other heart-valve replacement: Secondary | ICD-10-CM

## 2012-03-17 DIAGNOSIS — I82409 Acute embolism and thrombosis of unspecified deep veins of unspecified lower extremity: Secondary | ICD-10-CM

## 2012-04-07 ENCOUNTER — Ambulatory Visit (INDEPENDENT_AMBULATORY_CARE_PROVIDER_SITE_OTHER): Payer: Self-pay | Admitting: *Deleted

## 2012-04-07 DIAGNOSIS — I82409 Acute embolism and thrombosis of unspecified deep veins of unspecified lower extremity: Secondary | ICD-10-CM

## 2012-04-07 DIAGNOSIS — Z7901 Long term (current) use of anticoagulants: Secondary | ICD-10-CM

## 2012-04-07 DIAGNOSIS — Z954 Presence of other heart-valve replacement: Secondary | ICD-10-CM

## 2012-04-07 LAB — POCT INR: INR: 3.3

## 2012-05-05 ENCOUNTER — Ambulatory Visit (INDEPENDENT_AMBULATORY_CARE_PROVIDER_SITE_OTHER): Payer: Self-pay | Admitting: *Deleted

## 2012-05-05 DIAGNOSIS — Z7901 Long term (current) use of anticoagulants: Secondary | ICD-10-CM

## 2012-05-05 DIAGNOSIS — Z954 Presence of other heart-valve replacement: Secondary | ICD-10-CM

## 2012-05-05 DIAGNOSIS — I82409 Acute embolism and thrombosis of unspecified deep veins of unspecified lower extremity: Secondary | ICD-10-CM

## 2012-05-05 LAB — POCT INR: INR: 7

## 2012-05-09 ENCOUNTER — Ambulatory Visit (INDEPENDENT_AMBULATORY_CARE_PROVIDER_SITE_OTHER): Payer: Self-pay | Admitting: *Deleted

## 2012-05-09 DIAGNOSIS — Z7901 Long term (current) use of anticoagulants: Secondary | ICD-10-CM

## 2012-05-09 DIAGNOSIS — Z954 Presence of other heart-valve replacement: Secondary | ICD-10-CM

## 2012-05-09 DIAGNOSIS — I82409 Acute embolism and thrombosis of unspecified deep veins of unspecified lower extremity: Secondary | ICD-10-CM

## 2012-05-09 LAB — POCT INR: INR: 1.4

## 2012-06-01 ENCOUNTER — Ambulatory Visit (INDEPENDENT_AMBULATORY_CARE_PROVIDER_SITE_OTHER): Payer: Self-pay | Admitting: *Deleted

## 2012-06-01 DIAGNOSIS — Z7901 Long term (current) use of anticoagulants: Secondary | ICD-10-CM

## 2012-06-01 DIAGNOSIS — Z954 Presence of other heart-valve replacement: Secondary | ICD-10-CM

## 2012-06-01 DIAGNOSIS — I82409 Acute embolism and thrombosis of unspecified deep veins of unspecified lower extremity: Secondary | ICD-10-CM

## 2012-06-01 LAB — POCT INR: INR: 2.9

## 2012-06-07 ENCOUNTER — Encounter: Payer: Self-pay | Admitting: Family Medicine

## 2012-06-07 ENCOUNTER — Ambulatory Visit (INDEPENDENT_AMBULATORY_CARE_PROVIDER_SITE_OTHER): Payer: Self-pay | Admitting: Family Medicine

## 2012-06-07 VITALS — BP 158/96 | HR 90 | Resp 18 | Ht 64.0 in | Wt 135.0 lb

## 2012-06-07 DIAGNOSIS — R030 Elevated blood-pressure reading, without diagnosis of hypertension: Secondary | ICD-10-CM

## 2012-06-07 DIAGNOSIS — Z23 Encounter for immunization: Secondary | ICD-10-CM

## 2012-06-07 MED ORDER — AMLODIPINE BESYLATE 5 MG PO TABS: 5.0000 mg | ORAL_TABLET | Freq: Every day | ORAL | Status: DC

## 2012-06-07 NOTE — Patient Instructions (Addendum)
F/u in December   Blood pressure is high increase in amlodipine to 5mg  one daily. oK to take tWO 2.5mg  tablets once daily till done   Flu vaccine today

## 2012-06-19 NOTE — Assessment & Plan Note (Signed)
Uncontrolled, dose inc on med

## 2012-06-19 NOTE — Progress Notes (Signed)
  Subjective:    Patient ID: [name], male    DOB: 08/22/1951, 61 y.o.   MRN: [id number]  HPI The PT is here for follow up and re-evaluation of chronic medical conditions, medication management and review of any available recent lab and radiology data.  Preventive health is updated, specifically  Cancer screening and Immunization.   The PT denies any adverse reactions to current medications since the last visit.  There are no new concerns. Increased financial stress with less income There are no specific complaints       Review of Systems See HPI Denies recent fever or chills. Denies sinus pressure, nasal congestion, ear pain or sore throat. Denies chest congestion, productive cough or wheezing. Denies chest pains, palpitations and leg swelling Denies abdominal pain, nausea, vomiting,diarrhea or constipation.   Denies dysuria, frequency, hesitancy or incontinence. Denies joint pain, swelling and limitation in mobility. Denies headaches, seizures, numbness, or tingling. Denies depression, anxiety or insomnia. Denies skin break down or rash.        Objective:   Physical Exam Patient alert and oriented and in no cardiopulmonary distress.  HEENT: No facial asymmetry, EOMI, no sinus tenderness,  oropharynx pink and moist.  Neck supple no adenopathy.  Chest: Clear to auscultation bilaterally.Decreased air  entry throughout  CVS: S1, S2 , systolic  murmur, no S3.  ABD: Soft non tender. Bowel sounds normal.  Ext: No edema  MS: Adequate ROM spine, shoulders, hips and knees.  Skin: Intact, no ulcerations or rash noted.  Psych: Good eye contact, normal affect. Memory intact not anxious or depressed appearing.  CNS: CN 2-12 intact, power, tone and sensation normal throughout.         Assessment & Plan:

## 2012-06-22 ENCOUNTER — Ambulatory Visit (INDEPENDENT_AMBULATORY_CARE_PROVIDER_SITE_OTHER): Payer: Self-pay | Admitting: *Deleted

## 2012-06-22 DIAGNOSIS — Z7901 Long term (current) use of anticoagulants: Secondary | ICD-10-CM

## 2012-06-22 DIAGNOSIS — Z954 Presence of other heart-valve replacement: Secondary | ICD-10-CM

## 2012-06-22 DIAGNOSIS — I82409 Acute embolism and thrombosis of unspecified deep veins of unspecified lower extremity: Secondary | ICD-10-CM

## 2012-06-22 LAB — POCT INR: INR: 2.2

## 2012-07-20 ENCOUNTER — Ambulatory Visit (INDEPENDENT_AMBULATORY_CARE_PROVIDER_SITE_OTHER): Payer: Self-pay | Admitting: *Deleted

## 2012-07-20 DIAGNOSIS — Z7901 Long term (current) use of anticoagulants: Secondary | ICD-10-CM

## 2012-07-20 DIAGNOSIS — I82409 Acute embolism and thrombosis of unspecified deep veins of unspecified lower extremity: Secondary | ICD-10-CM

## 2012-07-20 DIAGNOSIS — Z954 Presence of other heart-valve replacement: Secondary | ICD-10-CM

## 2012-08-10 ENCOUNTER — Ambulatory Visit (INDEPENDENT_AMBULATORY_CARE_PROVIDER_SITE_OTHER): Payer: Self-pay | Admitting: *Deleted

## 2012-08-10 DIAGNOSIS — Z954 Presence of other heart-valve replacement: Secondary | ICD-10-CM

## 2012-08-10 DIAGNOSIS — I82409 Acute embolism and thrombosis of unspecified deep veins of unspecified lower extremity: Secondary | ICD-10-CM

## 2012-08-10 DIAGNOSIS — Z7901 Long term (current) use of anticoagulants: Secondary | ICD-10-CM

## 2012-09-07 ENCOUNTER — Ambulatory Visit (INDEPENDENT_AMBULATORY_CARE_PROVIDER_SITE_OTHER): Payer: Self-pay | Admitting: *Deleted

## 2012-09-07 DIAGNOSIS — Z954 Presence of other heart-valve replacement: Secondary | ICD-10-CM

## 2012-09-07 DIAGNOSIS — I82409 Acute embolism and thrombosis of unspecified deep veins of unspecified lower extremity: Secondary | ICD-10-CM

## 2012-09-07 DIAGNOSIS — Z7901 Long term (current) use of anticoagulants: Secondary | ICD-10-CM

## 2012-09-13 ENCOUNTER — Other Ambulatory Visit: Payer: Self-pay | Admitting: Cardiology

## 2012-10-05 ENCOUNTER — Ambulatory Visit (INDEPENDENT_AMBULATORY_CARE_PROVIDER_SITE_OTHER): Payer: Self-pay | Admitting: *Deleted

## 2012-10-05 DIAGNOSIS — I82409 Acute embolism and thrombosis of unspecified deep veins of unspecified lower extremity: Secondary | ICD-10-CM

## 2012-10-05 DIAGNOSIS — Z7901 Long term (current) use of anticoagulants: Secondary | ICD-10-CM

## 2012-10-05 DIAGNOSIS — Z954 Presence of other heart-valve replacement: Secondary | ICD-10-CM

## 2012-10-05 LAB — POCT INR: INR: 4

## 2012-10-07 ENCOUNTER — Ambulatory Visit (INDEPENDENT_AMBULATORY_CARE_PROVIDER_SITE_OTHER): Payer: Self-pay | Admitting: Family Medicine

## 2012-10-07 ENCOUNTER — Encounter: Payer: Self-pay | Admitting: Family Medicine

## 2012-10-07 VITALS — BP 178/100 | HR 72 | Temp 98.8°F | Resp 16 | Ht 64.0 in | Wt 138.0 lb

## 2012-10-07 DIAGNOSIS — J209 Acute bronchitis, unspecified: Secondary | ICD-10-CM | POA: Insufficient documentation

## 2012-10-07 DIAGNOSIS — R030 Elevated blood-pressure reading, without diagnosis of hypertension: Secondary | ICD-10-CM

## 2012-10-07 MED ORDER — TRIAMTERENE-HCTZ 75-50 MG PO TABS: 1.0000 | ORAL_TABLET | Freq: Every day | ORAL | Status: DC

## 2012-10-07 MED ORDER — PENICILLIN V POTASSIUM 250 MG PO TABS: 250.0000 mg | ORAL_TABLET | Freq: Four times a day (QID) | ORAL | Status: AC

## 2012-10-07 MED ORDER — BENZONATATE 100 MG PO CAPS: 100.0000 mg | ORAL_CAPSULE | Freq: Four times a day (QID) | ORAL | Status: DC | PRN

## 2012-10-07 NOTE — Patient Instructions (Addendum)
F/u in 4 to 5 weeks.  Medication is prescribed for acute bronchitis. Blood pressure is too high, an additional medication is prescribed, continue the one you are taking please.  Please go to [employer] [facility] to get help for you health insurance

## 2012-10-08 NOTE — Assessment & Plan Note (Signed)
Decongestants and antibiotics prescribed 

## 2012-10-08 NOTE — Progress Notes (Signed)
  Subjective:    Patient ID: [name], male    DOB: [birth year], 62 y.o.   MRN: [id number]  HPI 13 week h/o chest congestion worsening , with green sputum and intermittent chills. Denies sinus pressure, ear pain or sore throat. Has had intermittent chills, but no documented fever   Review of Systems See HPI Denies abdominal pain, nausea, vomiting,diarrhea or constipation.   Denies dysuria, frequency, hesitancy or incontinence. Denies joint pain, swelling and limitation in mobility. Denies headaches, seizures, numbness, or tingling. C/o anxiety and stress over inability to obtain health care coverage Denies skin break down or rash.        Objective:   Physical Exam Patient alert and oriented and in no cardiopulmonary distress.  HEENT: No facial asymmetry, EOMI, no sinus tenderness,  oropharynx pink and moist.  Neck supple no adenopathy.no JVD  Chest: decreased though adequate air entry with crackles , no wheezes  CVS: S1, S2  murmur, no S3.  ABD: Soft non tender. Bowel sounds normal.  Ext: No edema  MS: Adequate ROM spine, shoulders, hips and knees.  Skin: Intact, no ulcerations or rash noted.  Psych: Good eye contact, normal affect. Memory intact not anxious or depressed appearing.  CNS: CN 2-12 intact, power, tone and sensation normal throughout.        Assessment & Plan:

## 2012-10-08 NOTE — Assessment & Plan Note (Signed)
Uncontrolled, pt not taking maxzide prescribed. Re educated re the fact that he needs this and med sent in

## 2012-10-19 ENCOUNTER — Ambulatory Visit: Payer: Self-pay | Admitting: Family Medicine

## 2012-10-27 ENCOUNTER — Ambulatory Visit (INDEPENDENT_AMBULATORY_CARE_PROVIDER_SITE_OTHER): Payer: Self-pay | Admitting: Family Medicine

## 2012-10-27 ENCOUNTER — Encounter: Payer: Self-pay | Admitting: Family Medicine

## 2012-10-27 ENCOUNTER — Ambulatory Visit (INDEPENDENT_AMBULATORY_CARE_PROVIDER_SITE_OTHER): Payer: Self-pay | Admitting: *Deleted

## 2012-10-27 VITALS — BP 160/90 | HR 68 | Resp 18 | Ht 64.0 in | Wt 137.1 lb

## 2012-10-27 DIAGNOSIS — Z954 Presence of other heart-valve replacement: Secondary | ICD-10-CM

## 2012-10-27 DIAGNOSIS — I82409 Acute embolism and thrombosis of unspecified deep veins of unspecified lower extremity: Secondary | ICD-10-CM

## 2012-10-27 DIAGNOSIS — Z7901 Long term (current) use of anticoagulants: Secondary | ICD-10-CM

## 2012-10-27 DIAGNOSIS — R030 Elevated blood-pressure reading, without diagnosis of hypertension: Secondary | ICD-10-CM

## 2012-10-27 LAB — POCT INR: INR: 2.1

## 2012-10-27 NOTE — Patient Instructions (Addendum)
F/u in July , call if you need me before.  Take triamterene in the morning and amlodpine at night, you need both, blood pressure is still high

## 2012-10-27 NOTE — Assessment & Plan Note (Signed)
Uncontrolled non compliant with amlodipine  Unable to afford since October 2013

## 2012-10-29 NOTE — Progress Notes (Signed)
  Subjective:    Patient ID: STIRLING ORTON, male    DOB: 05-12-1951, 62 y.o.   MRN: [id number]  HPI Pt in for blood pressure review. He feels well with no complaints. Unfortunately, he has only been able to fill 1 of the 2 scripts prescribed due to financial reasons, blood pressure remains uncontrolled   Review of Systems See HPI Denies recent fever or chills. Denies sinus pressure, nasal congestion, ear pain or sore throat. Denies chest congestion, productive cough or wheezing. Denies chest pains, palpitations and leg swelling  Denies depression, anxiety or insomnia. Denies skin break down or rash.        Objective:   Physical Exam Patient alert and oriented and in no cardiopulmonary distress.  HEENT: No facial asymmetry, EOMI, no sinus tenderness,  oropharynx pink and moist.  Neck supple no adenopathy.  Chest: Clear to auscultation bilaterally.  CVS: S1, S2 systolic  murmurs, no S3.  ABD: Soft non tender. Bowel sounds normal.  Ext: No edema    Psych: Good eye contact, normal affect. Memory intact not anxious or depressed appearing.  CNS: CN 2-12 intact, power, t normal throughout.        Assessment & Plan:

## 2012-12-01 ENCOUNTER — Ambulatory Visit (INDEPENDENT_AMBULATORY_CARE_PROVIDER_SITE_OTHER): Payer: Self-pay | Admitting: *Deleted

## 2012-12-01 DIAGNOSIS — I82409 Acute embolism and thrombosis of unspecified deep veins of unspecified lower extremity: Secondary | ICD-10-CM

## 2012-12-01 DIAGNOSIS — Z7901 Long term (current) use of anticoagulants: Secondary | ICD-10-CM

## 2012-12-01 DIAGNOSIS — Z954 Presence of other heart-valve replacement: Secondary | ICD-10-CM

## 2012-12-29 ENCOUNTER — Ambulatory Visit (INDEPENDENT_AMBULATORY_CARE_PROVIDER_SITE_OTHER): Payer: Self-pay | Admitting: *Deleted

## 2012-12-29 DIAGNOSIS — Z7901 Long term (current) use of anticoagulants: Secondary | ICD-10-CM

## 2012-12-29 DIAGNOSIS — Z954 Presence of other heart-valve replacement: Secondary | ICD-10-CM

## 2012-12-29 DIAGNOSIS — I82409 Acute embolism and thrombosis of unspecified deep veins of unspecified lower extremity: Secondary | ICD-10-CM

## 2012-12-29 LAB — POCT INR: INR: 1.6

## 2013-01-19 ENCOUNTER — Ambulatory Visit (INDEPENDENT_AMBULATORY_CARE_PROVIDER_SITE_OTHER): Payer: Self-pay | Admitting: *Deleted

## 2013-01-19 DIAGNOSIS — I82409 Acute embolism and thrombosis of unspecified deep veins of unspecified lower extremity: Secondary | ICD-10-CM

## 2013-01-19 DIAGNOSIS — Z7901 Long term (current) use of anticoagulants: Secondary | ICD-10-CM

## 2013-01-19 DIAGNOSIS — Z954 Presence of other heart-valve replacement: Secondary | ICD-10-CM

## 2013-01-30 ENCOUNTER — Ambulatory Visit (INDEPENDENT_AMBULATORY_CARE_PROVIDER_SITE_OTHER): Payer: Self-pay | Admitting: *Deleted

## 2013-01-30 DIAGNOSIS — Z7901 Long term (current) use of anticoagulants: Secondary | ICD-10-CM

## 2013-01-30 DIAGNOSIS — Z954 Presence of other heart-valve replacement: Secondary | ICD-10-CM

## 2013-01-30 DIAGNOSIS — I82409 Acute embolism and thrombosis of unspecified deep veins of unspecified lower extremity: Secondary | ICD-10-CM

## 2013-02-09 ENCOUNTER — Ambulatory Visit (INDEPENDENT_AMBULATORY_CARE_PROVIDER_SITE_OTHER): Payer: Self-pay | Admitting: *Deleted

## 2013-02-09 DIAGNOSIS — I82409 Acute embolism and thrombosis of unspecified deep veins of unspecified lower extremity: Secondary | ICD-10-CM

## 2013-02-09 DIAGNOSIS — Z7901 Long term (current) use of anticoagulants: Secondary | ICD-10-CM

## 2013-02-09 DIAGNOSIS — Z954 Presence of other heart-valve replacement: Secondary | ICD-10-CM

## 2013-02-22 ENCOUNTER — Other Ambulatory Visit: Payer: Self-pay | Admitting: Cardiology

## 2013-02-23 ENCOUNTER — Ambulatory Visit (INDEPENDENT_AMBULATORY_CARE_PROVIDER_SITE_OTHER): Payer: Self-pay | Admitting: *Deleted

## 2013-02-23 DIAGNOSIS — Z954 Presence of other heart-valve replacement: Secondary | ICD-10-CM

## 2013-02-23 DIAGNOSIS — I82409 Acute embolism and thrombosis of unspecified deep veins of unspecified lower extremity: Secondary | ICD-10-CM

## 2013-02-23 DIAGNOSIS — Z7901 Long term (current) use of anticoagulants: Secondary | ICD-10-CM

## 2013-02-23 LAB — POCT INR: INR: 1.5

## 2013-03-13 ENCOUNTER — Ambulatory Visit: Payer: Self-pay | Admitting: Adult Health

## 2013-03-16 ENCOUNTER — Encounter: Payer: Self-pay | Admitting: Adult Health

## 2013-03-16 ENCOUNTER — Ambulatory Visit (INDEPENDENT_AMBULATORY_CARE_PROVIDER_SITE_OTHER): Payer: Self-pay | Admitting: *Deleted

## 2013-03-16 ENCOUNTER — Ambulatory Visit (INDEPENDENT_AMBULATORY_CARE_PROVIDER_SITE_OTHER): Payer: Self-pay | Admitting: Adult Health

## 2013-03-16 VITALS — BP 158/91 | HR 61 | Ht 64.0 in | Wt 131.0 lb

## 2013-03-16 DIAGNOSIS — I82409 Acute embolism and thrombosis of unspecified deep veins of unspecified lower extremity: Secondary | ICD-10-CM

## 2013-03-16 DIAGNOSIS — Z7901 Long term (current) use of anticoagulants: Secondary | ICD-10-CM

## 2013-03-16 DIAGNOSIS — I10 Essential (primary) hypertension: Secondary | ICD-10-CM

## 2013-03-16 DIAGNOSIS — Z954 Presence of other heart-valve replacement: Secondary | ICD-10-CM

## 2013-03-16 DIAGNOSIS — R030 Elevated blood-pressure reading, without diagnosis of hypertension: Secondary | ICD-10-CM

## 2013-03-16 LAB — POCT INR: INR: 1.6

## 2013-03-16 MED ORDER — TRIAMTERENE-HCTZ 75-50 MG PO TABS: 1.0000 | ORAL_TABLET | Freq: Every day | ORAL | Status: DC

## 2013-03-16 MED ORDER — LISINOPRIL 5 MG PO TABS: 5.0000 mg | ORAL_TABLET | Freq: Every day | ORAL | Status: DC

## 2013-03-16 MED ORDER — AMLODIPINE BESYLATE 5 MG PO TABS: 5.0000 mg | ORAL_TABLET | Freq: Every day | ORAL | Status: DC

## 2013-03-16 NOTE — Patient Instructions (Addendum)
Your physician recommends that you schedule a follow-up appointment in: 1 MONTH  Your physician has recommended you make the following change in your medication:  1.Start Taking Lisinopril 5 mg daily.  2. Continue on Maxide daily.

## 2013-03-16 NOTE — Assessment & Plan Note (Signed)
Mr. Walla is doing well but his only been on Maxzide and not taking the amlodipine as directed. He is at the [facility] in [place], and is probably not taking this to do to cost issues as it is not on [facility] formulary. I will place him on an ACE inhibitor lisinopril 5 mg daily. He has been advised to remove salt or less and salt in his diet. Will see him again in one week for blood pressure check, and on followup in one month to review labs.

## 2013-03-16 NOTE — Progress Notes (Signed)
   HPI: [name] is a 62 year old patient of Dr. Dietrich Pates we are following for ongoing assessment and management of known history of hypertension, DVT, history of diastolic CHF, and prosthetic valve endocarditis. Was last seen in the office in April 2012. 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 He has ongoing tobacco EtOH abuse. He is followed in our Coumadin clinic. He is here for annual evaluation, but has been lost to followup for 2 years. Last visit he was hypertensive and Benicar 40/12.5 mg were given to him in sample form.   He comes today without any complaints. He has had his PT/INR checked here in the office on this visit as well. He admits to dietary noncompliance eating salty foods.  No Known Allergies  Current Outpatient Prescriptions  Medication Sig Dispense Refill  . aspirin 81 MG tablet Take 81 mg by mouth daily.      . Ferrous Sulfate (IRON) 325 (65 FE) MG TABS Take 1 tablet by mouth daily.        [name] warfarin (COUMADIN) 5 MG tablet TAKE ONE TABLET BY MOUTH AS DIRECTED BY THE COUMADIN CLINIC  45 tablet  0  . lisinopril (PRINIVIL,ZESTRIL) 5 MG tablet Take 1 tablet (5 mg total) by mouth daily.  30 tablet  3  . triamterene-hydrochlorothiazide (MAXZIDE) 75-50 MG per tablet Take 1 tablet by mouth daily.  30 tablet  6   No current facility-administered medications for this visit.    Past Medical History  Diagnosis Date  . Mycotic aneurysm   . Prosthetic valve endocarditis   . ETOH abuse     heavy use  . DVT (deep venous thrombosis)   . S/P aortic valve replacement     pt was rx for prosthetic valve endocarditis  . Hypertension     Past Surgical History  Procedure Laterality Date  . Aoric valve replacement  09/2003    29mm Medtronic Freestyle stentless porcine bioprosthesis [facility])  . Left leg surgery      to remove clots     AOZ:HYQMVH of systems  complete and found to be negative unless listed above PHYSICAL EXAM BP 158/91  Pulse 61  Ht 5\' 4"  (1.626 m)  Wt 131 lb (59.421 kg)  BMI 22.47 kg/m2  General: Well developed, well nourished, in no acute distress Head: Eyes PERRLA, No xanthomas.   Normal cephalic and atramatic  Lungs: Clear bilaterally to auscultation and percussion. Heart: HRRR S1 S2, without MRG.  Pulses are 2+ & equal.            No carotid bruit. No JVD.  No abdominal bruits. No femoral bruits. Abdomen: Bowel sounds are positive, abdomen soft and non-tender without masses or                  Hernia's noted. Msk:  Back normal, normal gait. Normal strength and tone for age. Extremities: No clubbing, cyanosis or edema. Well healed scar on the interior aspect of the lower left leg  DP +1 Neuro: Alert and oriented X 3. Psych:  Good affect, responds appropriately    ASSESSMENT AND PLAN

## 2013-03-16 NOTE — Assessment & Plan Note (Signed)
He will continue in our Coumadin clinic with therapeutic INR maintained in dosing of Coumadin. Offers no complaints of bleeding, hemoptysis, or melena

## 2013-03-29 ENCOUNTER — Ambulatory Visit (INDEPENDENT_AMBULATORY_CARE_PROVIDER_SITE_OTHER): Payer: Self-pay | Admitting: *Deleted

## 2013-03-29 ENCOUNTER — Ambulatory Visit (INDEPENDENT_AMBULATORY_CARE_PROVIDER_SITE_OTHER): Payer: Self-pay | Admitting: Family Medicine

## 2013-03-29 ENCOUNTER — Encounter: Payer: Self-pay | Admitting: Family Medicine

## 2013-03-29 VITALS — BP 180/97 | HR 64 | Ht 64.0 in | Wt 130.0 lb

## 2013-03-29 VITALS — BP 178/90 | HR 76 | Resp 16 | Ht 64.0 in | Wt 130.1 lb

## 2013-03-29 DIAGNOSIS — R0989 Other specified symptoms and signs involving the circulatory and respiratory systems: Secondary | ICD-10-CM

## 2013-03-29 DIAGNOSIS — R030 Elevated blood-pressure reading, without diagnosis of hypertension: Secondary | ICD-10-CM

## 2013-03-29 DIAGNOSIS — Z7901 Long term (current) use of anticoagulants: Secondary | ICD-10-CM

## 2013-03-29 DIAGNOSIS — I10 Essential (primary) hypertension: Secondary | ICD-10-CM

## 2013-03-29 DIAGNOSIS — I82409 Acute embolism and thrombosis of unspecified deep veins of unspecified lower extremity: Secondary | ICD-10-CM

## 2013-03-29 DIAGNOSIS — Z954 Presence of other heart-valve replacement: Secondary | ICD-10-CM

## 2013-03-29 DIAGNOSIS — A267 Erysipelothrix sepsis: Secondary | ICD-10-CM

## 2013-03-29 LAB — BASIC METABOLIC PANEL
Chloride: 99 mEq/L (ref 96–112)
Potassium: 4.3 mEq/L (ref 3.5–5.3)
Sodium: 138 mEq/L (ref 135–145)

## 2013-03-29 MED ORDER — WARFARIN SODIUM 5 MG PO TABS: 5.0000 mg | ORAL_TABLET | Freq: Every day | ORAL | Status: DC

## 2013-03-29 MED ORDER — TRIAMTERENE-HCTZ 75-50 MG PO TABS: 1.0000 | ORAL_TABLET | Freq: Every day | ORAL | Status: DC

## 2013-03-29 MED ORDER — LISINOPRIL 5 MG PO TABS: 5.0000 mg | ORAL_TABLET | Freq: Every day | ORAL | Status: DC

## 2013-03-29 NOTE — Progress Notes (Signed)
There appear to be issues with him picking up his medications and compliance. As discussed with the patient by you, he should be taking medications daily as directed.

## 2013-03-29 NOTE — Patient Instructions (Addendum)
F/u in 6 month  Call if you need me before.  You will be given the 2 scripts today for your blood pressure, since you did not get this since it was sent in 2 weeks ago reportedly

## 2013-03-29 NOTE — Progress Notes (Signed)
Pt present for blood pressure check with a bp reading of 180/97 HR 64. Pt states that is he tired and that he has not taking his medication due to problem at pharmacy. This nurse called [facility] pharmacy in [place] to make sure his medicine (Lisinopril and Maxide) is ready for pick up. Also, showed KL blood pressure reading and to let her know that pt has not been taking his medicine. KL gave pt medicine (benicar HCT 40mg /25mg ) from office and told him to sit and wait in lobby for 45 minutes until we can check BP again. Recheck of bp 45 min later noted at 186/100. This nurse spoke to [facility], NP advised her of reading. Gave verbal instruction for pt to pick up medication and come back for BP check.  Pt declined earlier appt, accepted appt for Monday morning 7/14.

## 2013-03-29 NOTE — Progress Notes (Signed)
Reviewed

## 2013-03-29 NOTE — Patient Instructions (Signed)
Your physician recommends that you schedule a follow-up appointment in: TO BE DETERMINED   

## 2013-03-30 ENCOUNTER — Encounter: Payer: Self-pay | Admitting: *Deleted

## 2013-04-03 ENCOUNTER — Ambulatory Visit (INDEPENDENT_AMBULATORY_CARE_PROVIDER_SITE_OTHER): Payer: Self-pay | Admitting: *Deleted

## 2013-04-03 VITALS — BP 147/88 | HR 71 | Ht 63.0 in | Wt 132.0 lb

## 2013-04-03 DIAGNOSIS — I10 Essential (primary) hypertension: Secondary | ICD-10-CM

## 2013-04-03 NOTE — Progress Notes (Signed)
Noted  

## 2013-04-03 NOTE — Patient Instructions (Addendum)
Your physician recommends that you schedule a follow-up appointment April 20, 2013 at 11:20am

## 2013-04-03 NOTE — Progress Notes (Signed)
Pt present for Blood pressure check. BP reading is 147/88 with HR of 71. Pt states he feels better.   Last BP visit, discharge summary, and VS:180/97 64   Pt present for blood pressure check with a bp reading of 180/97 HR 64.  Pt states that is he tired and that he has not taking his medication due to problem at pharmacy.  This nurse called [facility] pharmacy in [place] to make sure his medicine (Lisinopril and Maxide) is ready for pick up.  Also, showed KL blood pressure reading and to let her know that pt has not been taking his medicine.  KL gave pt medicine (benicar HCT 40mg /25mg ) from office and told him to sit and wait in lobby for 45 minutes until we can check BP again.  Recheck of bp 45 min later noted at 186/100.  This nurse spoke to [facility], NP advised her of reading. Gave verbal instruction for pt to pick up medication and come back for BP check.  Pt declined earlier appt, accepted appt for Monday morning 7/14.

## 2013-04-20 ENCOUNTER — Ambulatory Visit: Payer: Self-pay | Admitting: Adult Health

## 2013-04-24 NOTE — Progress Notes (Signed)
  Subjective:    Patient ID: [name], male    DOB: 1950-10-20, 62 y.o.   MRN: [id number]  HPI Pt in for re evaluation of hypertension and general health Again states he had no medication  for past 2 weeks, hence blood pressure high Denies headache chest pain or light headedness. Main concern is financial stress and lack of access to health care   Review of Systems See HPI Denies recent fever or chills. Denies sinus pressure, nasal congestion, ear pain or sore throat. Denies chest congestion, productive cough or wheezing. Denies chest pains, palpitations and leg swelling Denies abdominal pain, nausea, vomiting,diarrhea or constipation.   Denies dysuria, frequency, hesitancy or incontinence. Denies joint pain, swelling and limitation in mobility. Denies headaches, seizures, numbness, or tingling. . Denies skin break down or rash.        Objective:   Physical Exam  Patient alert and oriented and in no cardiopulmonary distress.  HEENT: No facial asymmetry, EOMI, no sinus tenderness,  oropharynx pink and moist.  Neck supple no adenopathy.  Chest: Clear to auscultation bilaterally.  CVS: S1, S2 systolic murmur, no S3.  ABD: Soft non tender. Bowel sounds normal.  Ext: No edema  MS: Adequate ROM spine, shoulders, hips and knees.  Skin: Intact, no ulcerations or rash noted.  Psych: Good eye contact, normal affect. Memory intact not anxious or depressed appearing.  CNS: CN 2-12 intact, power, tone and sensation normal throughout.       Assessment & Plan:

## 2013-04-24 NOTE — Assessment & Plan Note (Signed)
Uncontrolled, pt reports that no medication was available at the pharmacy, this is a repeated issue when he comes in medication non compliance an ongoing challenge

## 2013-05-01 ENCOUNTER — Encounter: Payer: Self-pay | Admitting: Adult Health

## 2013-05-01 ENCOUNTER — Ambulatory Visit (INDEPENDENT_AMBULATORY_CARE_PROVIDER_SITE_OTHER): Payer: Self-pay | Admitting: *Deleted

## 2013-05-01 ENCOUNTER — Ambulatory Visit (INDEPENDENT_AMBULATORY_CARE_PROVIDER_SITE_OTHER): Payer: Self-pay | Admitting: Adult Health

## 2013-05-01 VITALS — BP 132/80 | HR 77 | Wt 132.0 lb

## 2013-05-01 DIAGNOSIS — I82409 Acute embolism and thrombosis of unspecified deep veins of unspecified lower extremity: Secondary | ICD-10-CM

## 2013-05-01 DIAGNOSIS — R030 Elevated blood-pressure reading, without diagnosis of hypertension: Secondary | ICD-10-CM

## 2013-05-01 DIAGNOSIS — Z7901 Long term (current) use of anticoagulants: Secondary | ICD-10-CM

## 2013-05-01 DIAGNOSIS — Z954 Presence of other heart-valve replacement: Secondary | ICD-10-CM

## 2013-05-01 NOTE — Assessment & Plan Note (Addendum)
He remains on Coumadin therapy with dosing in our [place] office. INR was elevated. This is believed to be related to alcoholism and binge drinking over the weekend. Medications were adjusted. Assessment discuss with the patient by Coumadin clinic and myself that he will need to decrease his drinking and hopefully eliminated. I have advised him that there is help for him to assess with his alcoholism. He verbalizes understanding but appears unwilling to proceed with any treatment at this time

## 2013-05-01 NOTE — Progress Notes (Deleted)
Name: [name]    DOB: [birth year]  Age: 62 y.o.  MR#: [id number]       PCP:  Syliva Overman, MD      Insurance: Payor: / No coverage found.  CC:    Chief Complaint  Patient presents with  . Hypertension    VS Filed Vitals:   05/01/13 1436  BP: 132/80  Pulse: 77  Weight: 132 lb (59.875 kg)  SpO2: 98%    Weights Current Weight  05/01/13 132 lb (59.875 kg)  04/03/13 132 lb (59.875 kg)  03/29/13 130 lb (58.968 kg)    Blood Pressure  BP Readings from Last 3 Encounters:  05/01/13 132/80  04/03/13 147/88  03/29/13 180/97     Admit date:  (Not on file) Last encounter with RMR:  04/20/2013   Allergy Review of patient's allergies indicates no known allergies.  Current Outpatient Prescriptions  Medication Sig Dispense Refill  . aspirin 81 MG tablet Take 81 mg by mouth daily.      . Ferrous Sulfate (IRON) 325 (65 FE) MG TABS Take 1 tablet by mouth daily.        [name] lisinopril (PRINIVIL,ZESTRIL) 5 MG tablet Take 1 tablet (5 mg total) by mouth daily.  30 tablet  3  . triamterene-hydrochlorothiazide (MAXZIDE) 75-50 MG per tablet Take 1 tablet by mouth daily.  30 tablet  11  . warfarin (COUMADIN) 5 MG tablet Take 1 tablet (5 mg total) by mouth daily. Or as directed  45 tablet  6   No current facility-administered medications for this visit.    Discontinued Meds:   There are no discontinued medications.  Patient Active Problem List   Diagnosis Date Noted  . Encounter for long-term (current) use of anticoagulants 12/19/2010  . Mycotic aneurysm 12/10/2010  . Prosthetic valve endocarditis 12/10/2010  . Hepatic artery aneurysm 12/10/2010  . GI bleed 12/10/2010  . S/P aortic valve replacement   . LEG PAIN, LEFT 08/13/2010  . AORTIC VALVE REPLACEMENT, HX OF 08/13/2010  . LEUKOCYTOSIS 07/10/2010  . GASTROENTERITIS 07/08/2010  . INCREASED BLOOD PRESSURE 05/13/2010  . DVT 04/21/2010    LABS    Component Value Date/Time   NA 138 03/29/2013 0750   NA 133* 02/11/2011 0440    NA 136 02/08/2011 0505   K 4.3 03/29/2013 0750   K 4.2 02/11/2011 0440   K 3.3* 02/08/2011 0505   CL 99 03/29/2013 0750   CL 95* 02/11/2011 0440   CL 99 02/08/2011 0505   CO2 27 03/29/2013 0750   CO2 29 02/11/2011 0440   CO2 30 02/08/2011 0505   GLUCOSE 74 03/29/2013 0750   GLUCOSE 108* 02/11/2011 0440   GLUCOSE 104* 02/08/2011 0505   BUN 11 03/29/2013 0750   BUN 13 02/11/2011 0440   BUN 3* 02/08/2011 0505   CREATININE 0.83 03/29/2013 0750   CREATININE 0.92 02/11/2011 0440   CREATININE 0.76 02/08/2011 0505   CREATININE 0.76 02/06/2011 0355   CREATININE 0.93 01/28/2011 1032   CREATININE 0.95 01/12/2011 1127   CALCIUM 9.3 03/29/2013 0750   CALCIUM 9.6 02/11/2011 0440   CALCIUM 8.8 02/08/2011 0505   GFRNONAA >60 02/11/2011 0440   GFRNONAA >60 02/08/2011 0505   GFRNONAA >60 02/06/2011 0355   GFRAA  Value: >60        The eGFR has been calculated using the MDRD equation. This calculation has not been validated in all clinical situations. eGFR's persistently <60 mL/min signify possible Chronic Kidney Disease. 02/11/2011 0440   GFRAA  Value: >60        The eGFR has been calculated using the MDRD equation. This calculation has not been validated in all clinical situations. eGFR's persistently <60 mL/min signify possible Chronic Kidney Disease. 02/08/2011 0505   GFRAA  Value: >60        The eGFR has been calculated using the MDRD equation. This calculation has not been validated in all clinical situations. eGFR's persistently <60 mL/min signify possible Chronic Kidney Disease. 02/06/2011 0355   CMP     Component Value Date/Time   NA 138 03/29/2013 0750   K 4.3 03/29/2013 0750   CL 99 03/29/2013 0750   CO2 27 03/29/2013 0750   GLUCOSE 74 03/29/2013 0750   BUN 11 03/29/2013 0750   CREATININE 0.83 03/29/2013 0750   CREATININE 0.92 02/11/2011 0440   CALCIUM 9.3 03/29/2013 0750   PROT 7.8 02/05/2011 1030   ALBUMIN 4.1 02/05/2011 1030   AST 42* 02/05/2011 1030   ALT 24 02/05/2011 1030   ALKPHOS 88 02/05/2011 1030   BILITOT 1.8* 02/05/2011  1030   GFRNONAA >60 02/11/2011 0440   GFRAA  Value: >60        The eGFR has been calculated using the MDRD equation. This calculation has not been validated in all clinical situations. eGFR's persistently <60 mL/min signify possible Chronic Kidney Disease. 02/11/2011 0440       Component Value Date/Time   WBC 7.3 02/15/2011 0525   WBC 7.5 02/14/2011 0400   WBC 7.9 02/13/2011 0500   HGB 12.3* 02/15/2011 0525   HGB 11.9* 02/14/2011 0400   HGB 11.9* 02/13/2011 0500   HCT 36.5* 02/15/2011 0525   HCT 34.9* 02/14/2011 0400   HCT 34.5* 02/13/2011 0500   MCV 83.0 02/15/2011 0525   MCV 82.7 02/14/2011 0400   MCV 83.3 02/13/2011 0500    Lipid Panel  No results found for this basename: chol, trig, hdl, cholhdl, vldl, ldlcalc    ABG    Component Value Date/Time   PHART 7.416 10/28/2010 1226   PCO2ART 36.4 10/28/2010 1226   PO2ART 71.0* 10/28/2010 1226   HCO3 23.5 10/28/2010 1226   TCO2 25 10/28/2010 1226   ACIDBASEDEF 1.0 10/28/2010 1226   O2SAT 95.0 10/28/2010 1226     No results found for this basename: TSH   BNP (last 3 results) No results found for this basename: PROBNP,  in the last 8760 hours Cardiac Panel (last 3 results) No results found for this basename: CKTOTAL, CKMB, TROPONINI, RELINDX,  in the last 72 hours  Iron/TIBC/Ferritin No results found for this basename: iron, tibc, ferritin     EKG Orders placed in visit on 03/16/13  . EKG 12-LEAD     Prior Assessment and Plan Problem List as of 05/01/2013     Cardiovascular and Mediastinum   DVT   Last Assessment & Plan   03/16/2013 Office Visit Written 03/16/2013 11:32 AM by Jodelle Gross, NP     He will continue in our Coumadin clinic with therapeutic INR maintained in dosing of Coumadin. Offers no complaints of bleeding, hemoptysis, or melena    Mycotic aneurysm   Last Assessment & Plan   01/28/2011 Office Visit Written 01/28/2011  2:46 PM by Randall Hiss, MD     The reason for continued antibiotic therapy was a concern that his  hepatic artery aneurysm may have been a mycotic aneurysm. There was much about a CT scan to suggest infection including the proximity to the gallbladder that seemed  inflamed as well. I will repeat a CT scan of the abdomen and pelvis as a CT and a gram to reassess the aneurysm. I will change the ciprofloxacin for the interim. If the CT scan is reassuring I may consider taking him off antibiotic therapy and observing him. However however it is still concerning for a mycotic aneurysm I would like to refer him to a tertiary center to care center for consideration of removal of an at the mycotic tissue and versus long-term suppressive antibiotic therapy which is not without risk in this gentleman.    Prosthetic valve endocarditis   Last Assessment & Plan   01/28/2011 Office Visit Written 01/28/2011  2:50 PM by Randall Hiss, MD     See above the patient has received more than adequete therapy for this    Hepatic artery aneurysm   Last Assessment & Plan   12/10/2010 Office Visit Written 12/10/2010 10:18 AM by Randall Hiss, MD     See above discussion. Bleeding was stopped with embolization but I worry about this being mycotic and progressing. Will try to suppress with po abx    S/P aortic valve replacement     Digestive   GASTROENTERITIS   GI bleed     Other   LEUKOCYTOSIS   LEG PAIN, LEFT   INCREASED BLOOD PRESSURE   Last Assessment & Plan   03/29/2013 Office Visit Written 04/24/2013  7:31 AM by Kerri Perches, MD     Uncontrolled, pt reports that no medication was available at the pharmacy, this is a repeated issue when he comes in medication non compliance an ongoing challenge    AORTIC VALVE REPLACEMENT, HX OF   Last Assessment & Plan   01/28/2011 Office Visit Written 01/28/2011  2:46 PM by Randall Hiss, MD     There was concern about possible prosthetic valve endocarditis he was treated as such. His TEE that did not show any vegetations although as mentioned again the CT scan  was concerning for endocarditis. He has received more than adequate therapy.    Encounter for long-term (current) use of anticoagulants       Imaging: No results found.

## 2013-05-01 NOTE — Assessment & Plan Note (Signed)
Blood pressure is fairly well controlled, and he is medically compliant. He offers no complaints of chest pain palpitations or heaviness. We will continue him on his current medication regimen and I have congratulated him on medical compliance.

## 2013-05-01 NOTE — Patient Instructions (Signed)

## 2013-05-01 NOTE — Progress Notes (Signed)
   HPI:  [name] is a 62 year-old patient of Dr. Dietrich Pates we are following for ongoing assessment and management of hypertension, diastolic CHF, with history of prosthetic valve endocarditis, and DVT. He was last in the office in June of 2014, without complaint, with well-controlled blood pressure. He is continued in our Coumadin clinic for ongoing dosing of his Coumadin. Unfortunately, he continues to drink excessively.  No Known Allergies  Current Outpatient Prescriptions  Medication Sig Dispense Refill  . aspirin 81 MG tablet Take 81 mg by mouth daily.      . Ferrous Sulfate (IRON) 325 (65 FE) MG TABS Take 1 tablet by mouth daily.        [name] lisinopril (PRINIVIL,ZESTRIL) 5 MG tablet Take 1 tablet (5 mg total) by mouth daily.  30 tablet  3  . triamterene-hydrochlorothiazide (MAXZIDE) 75-50 MG per tablet Take 1 tablet by mouth daily.  30 tablet  11  . warfarin (COUMADIN) 5 MG tablet Take 1 tablet (5 mg total) by mouth daily. Or as directed  45 tablet  6   No current facility-administered medications for this visit.    Past Medical History  Diagnosis Date  . Mycotic aneurysm   . Prosthetic valve endocarditis   . ETOH abuse     heavy use  . DVT (deep venous thrombosis)   . S/P aortic valve replacement     pt was rx for prosthetic valve endocarditis  . Hypertension     Past Surgical History  Procedure Laterality Date  . Aoric valve replacement  09/2003    29mm Medtronic Freestyle stentless porcine bioprosthesis [facility])  . Left leg surgery      to remove clots     WUJ:WJXBJY of systems complete and found to be negative unless listed above  PHYSICAL EXAM BP 132/80  Pulse 77  Wt 132 lb (59.875 kg)  BMI 23.39 kg/m2  SpO2 98%  General: Well developed, well nourished, in no acute distress Head: Eyes PERRLA, No xanthomas.   Normal cephalic and atramatic  Lungs: Clear bilaterally to auscultation and percussion. Heart: HRRR S1 S2, 1/6 systolic murmur, Pulses are 2+ & equal.       No carotid bruit. No JVD.  No abdominal bruits. No femoral bruits. Abdomen: Bowel sounds are positive, abdomen soft and non-tender without masses or                  Hernia's noted. Msk:  Back normal, normal gait. Normal strength and tone for age. Extremities: No clubbing, cyanosis or edema.  DP +1 Neuro: Alert and oriented X 3. Psych:  Good affect, responds appropriately    ASSESSMENT AND PLAN

## 2013-05-17 ENCOUNTER — Ambulatory Visit (INDEPENDENT_AMBULATORY_CARE_PROVIDER_SITE_OTHER): Payer: Medicare Other | Admitting: *Deleted

## 2013-05-17 DIAGNOSIS — I82409 Acute embolism and thrombosis of unspecified deep veins of unspecified lower extremity: Secondary | ICD-10-CM

## 2013-05-17 DIAGNOSIS — Z954 Presence of other heart-valve replacement: Secondary | ICD-10-CM

## 2013-05-17 DIAGNOSIS — Z7901 Long term (current) use of anticoagulants: Secondary | ICD-10-CM

## 2013-05-17 LAB — POCT INR: INR: 3.6

## 2013-06-07 ENCOUNTER — Ambulatory Visit (INDEPENDENT_AMBULATORY_CARE_PROVIDER_SITE_OTHER): Payer: Medicare Other | Admitting: *Deleted

## 2013-06-07 DIAGNOSIS — Z7901 Long term (current) use of anticoagulants: Secondary | ICD-10-CM

## 2013-06-07 DIAGNOSIS — Z954 Presence of other heart-valve replacement: Secondary | ICD-10-CM

## 2013-06-07 DIAGNOSIS — I82409 Acute embolism and thrombosis of unspecified deep veins of unspecified lower extremity: Secondary | ICD-10-CM

## 2013-07-05 ENCOUNTER — Ambulatory Visit (INDEPENDENT_AMBULATORY_CARE_PROVIDER_SITE_OTHER): Payer: Medicare Other | Admitting: *Deleted

## 2013-07-05 DIAGNOSIS — Z7901 Long term (current) use of anticoagulants: Secondary | ICD-10-CM

## 2013-07-05 DIAGNOSIS — I82409 Acute embolism and thrombosis of unspecified deep veins of unspecified lower extremity: Secondary | ICD-10-CM

## 2013-07-05 DIAGNOSIS — Z954 Presence of other heart-valve replacement: Secondary | ICD-10-CM

## 2013-07-19 ENCOUNTER — Ambulatory Visit (INDEPENDENT_AMBULATORY_CARE_PROVIDER_SITE_OTHER): Payer: Medicare Other | Admitting: *Deleted

## 2013-07-19 DIAGNOSIS — Z954 Presence of other heart-valve replacement: Secondary | ICD-10-CM

## 2013-07-19 DIAGNOSIS — I82409 Acute embolism and thrombosis of unspecified deep veins of unspecified lower extremity: Secondary | ICD-10-CM

## 2013-07-19 DIAGNOSIS — Z7901 Long term (current) use of anticoagulants: Secondary | ICD-10-CM

## 2013-07-19 LAB — POCT INR: INR: 2.9

## 2013-07-22 HISTORY — PX: CORONARY ANGIOPLASTY WITH STENT PLACEMENT: SHX49

## 2013-08-09 ENCOUNTER — Inpatient Hospital Stay (HOSPITAL_COMMUNITY)
Admission: AD | Admit: 2013-08-09 | Discharge: 2013-08-12 | DRG: 249 | Disposition: A | Discharge status: Home / Self Care | Payer: Medicare Other | Source: Ambulatory Visit | Attending: Cardiology | Admitting: Cardiology

## 2013-08-09 ENCOUNTER — Ambulatory Visit: Payer: Medicare Other | Admitting: Cardiology

## 2013-08-09 ENCOUNTER — Encounter: Payer: Self-pay | Admitting: Cardiology

## 2013-08-09 ENCOUNTER — Encounter (HOSPITAL_COMMUNITY): Payer: Self-pay | Admitting: *Deleted

## 2013-08-09 ENCOUNTER — Inpatient Hospital Stay (HOSPITAL_COMMUNITY): Payer: Medicare Other

## 2013-08-09 ENCOUNTER — Ambulatory Visit (INDEPENDENT_AMBULATORY_CARE_PROVIDER_SITE_OTHER): Payer: Medicare Other | Admitting: *Deleted

## 2013-08-09 ENCOUNTER — Ambulatory Visit (INDEPENDENT_AMBULATORY_CARE_PROVIDER_SITE_OTHER): Payer: Medicare Other | Admitting: Cardiology

## 2013-08-09 VITALS — BP 182/122 | HR 99

## 2013-08-09 DIAGNOSIS — I82409 Acute embolism and thrombosis of unspecified deep veins of unspecified lower extremity: Secondary | ICD-10-CM

## 2013-08-09 DIAGNOSIS — F1010 Alcohol abuse, uncomplicated: Secondary | ICD-10-CM | POA: Diagnosis present

## 2013-08-09 DIAGNOSIS — Z87891 Personal history of nicotine dependence: Secondary | ICD-10-CM

## 2013-08-09 DIAGNOSIS — I729 Aneurysm of unspecified site: Secondary | ICD-10-CM | POA: Diagnosis present

## 2013-08-09 DIAGNOSIS — Z954 Presence of other heart-valve replacement: Secondary | ICD-10-CM

## 2013-08-09 DIAGNOSIS — Z8719 Personal history of other diseases of the digestive system: Secondary | ICD-10-CM

## 2013-08-09 DIAGNOSIS — I700 Atherosclerosis of aorta: Secondary | ICD-10-CM | POA: Diagnosis present

## 2013-08-09 DIAGNOSIS — Z79899 Other long term (current) drug therapy: Secondary | ICD-10-CM

## 2013-08-09 DIAGNOSIS — T826XXD Infection and inflammatory reaction due to cardiac valve prosthesis, subsequent encounter: Secondary | ICD-10-CM

## 2013-08-09 DIAGNOSIS — I10 Essential (primary) hypertension: Secondary | ICD-10-CM | POA: Diagnosis present

## 2013-08-09 DIAGNOSIS — Z7982 Long term (current) use of aspirin: Secondary | ICD-10-CM

## 2013-08-09 DIAGNOSIS — Z91199 Patient's noncompliance with other medical treatment and regimen due to unspecified reason: Secondary | ICD-10-CM

## 2013-08-09 DIAGNOSIS — Z952 Presence of prosthetic heart valve: Secondary | ICD-10-CM

## 2013-08-09 DIAGNOSIS — K922 Gastrointestinal hemorrhage, unspecified: Secondary | ICD-10-CM

## 2013-08-09 DIAGNOSIS — I200 Unstable angina: Secondary | ICD-10-CM

## 2013-08-09 DIAGNOSIS — I2510 Atherosclerotic heart disease of native coronary artery without angina pectoris: Secondary | ICD-10-CM | POA: Diagnosis present

## 2013-08-09 DIAGNOSIS — Z7901 Long term (current) use of anticoagulants: Secondary | ICD-10-CM

## 2013-08-09 DIAGNOSIS — Z9119 Patient's noncompliance with other medical treatment and regimen: Secondary | ICD-10-CM

## 2013-08-09 DIAGNOSIS — R079 Chest pain, unspecified: Secondary | ICD-10-CM

## 2013-08-09 DIAGNOSIS — Z5189 Encounter for other specified aftercare: Secondary | ICD-10-CM

## 2013-08-09 DIAGNOSIS — Z86718 Personal history of other venous thrombosis and embolism: Secondary | ICD-10-CM

## 2013-08-09 DIAGNOSIS — R791 Abnormal coagulation profile: Secondary | ICD-10-CM | POA: Diagnosis present

## 2013-08-09 DIAGNOSIS — I214 Non-ST elevation (NSTEMI) myocardial infarction: Secondary | ICD-10-CM

## 2013-08-09 HISTORY — DX: Atherosclerotic heart disease of native coronary artery without angina pectoris: I25.10

## 2013-08-09 HISTORY — DX: Embolism and thrombosis of arteries of the lower extremities: I74.3

## 2013-08-09 LAB — CBC WITH DIFFERENTIAL/PLATELET
Basophils Relative: 0 % (ref 0–1)
Eosinophils Relative: 12 % — ABNORMAL HIGH (ref 0–5)
HCT: 45.2 % (ref 39.0–52.0)
Hemoglobin: 16.1 g/dL (ref 13.0–17.0)
Lymphocytes Relative: 19 % (ref 12–46)
Lymphs Abs: 2.2 10*3/uL (ref 0.7–4.0)
MCV: 90.4 fL (ref 78.0–100.0)
Monocytes Absolute: 1 10*3/uL (ref 0.1–1.0)
Monocytes Relative: 8 % (ref 3–12)
Neutro Abs: 7.1 10*3/uL (ref 1.7–7.7)
Neutrophils Relative %: 60 % (ref 43–77)
WBC: 11.7 10*3/uL — ABNORMAL HIGH (ref 4.0–10.5)

## 2013-08-09 LAB — COMPREHENSIVE METABOLIC PANEL
AST: 33 U/L (ref 0–37)
Albumin: 4.2 g/dL (ref 3.5–5.2)
BUN: 11 mg/dL (ref 6–23)
CO2: 24 mEq/L (ref 19–32)
Calcium: 9.1 mg/dL (ref 8.4–10.5)
Chloride: 99 mEq/L (ref 96–112)
Creatinine, Ser: 0.86 mg/dL (ref 0.50–1.35)
GFR calc Af Amer: >90 mL/min (ref >90)
GFR calc non Af Amer: >90 mL/min (ref >90)
Glucose, Bld: 89 mg/dL (ref 70–99)
Total Bilirubin: 2.2 mg/dL — ABNORMAL HIGH (ref 0.3–1.2)
Total Protein: 8.1 g/dL (ref 6.0–8.3)

## 2013-08-09 LAB — MRSA PCR SCREENING: MRSA by PCR: NEGATIVE

## 2013-08-09 LAB — POCT INR: INR: 1.9

## 2013-08-09 LAB — HEPARIN LEVEL (UNFRACTIONATED): Heparin Unfractionated: <0.1 IU/mL — ABNORMAL LOW (ref 0.30–0.70)

## 2013-08-09 LAB — PROTIME-INR
INR: 1.62 — ABNORMAL HIGH (ref 0.00–1.49)
Prothrombin Time: 18.8 seconds — ABNORMAL HIGH (ref 11.6–15.2)

## 2013-08-09 MED ORDER — SODIUM CHLORIDE 0.9 % IJ SOLN
3.0000 mL | Freq: Two times a day (BID) | INTRAMUSCULAR | Status: DC
  Administered 2013-08-09: 3 mL via INTRAVENOUS

## 2013-08-09 MED ORDER — NITROGLYCERIN 2 % TD OINT
1.0000 [in_us] | TOPICAL_OINTMENT | Freq: Four times a day (QID) | TRANSDERMAL | Status: DC
  Administered 2013-08-09 – 2013-08-10 (×5): 1 [in_us] via TOPICAL
  Filled 2013-08-09 (×2): qty 30

## 2013-08-09 MED ORDER — SODIUM CHLORIDE 0.9 % IJ SOLN: 3.0000 mL | INTRAMUSCULAR | Status: DC | PRN

## 2013-08-09 MED ORDER — ASPIRIN 300 MG RE SUPP
300.0000 mg | RECTAL | Status: AC
  Filled 2013-08-09: qty 1

## 2013-08-09 MED ORDER — SODIUM CHLORIDE 0.9 % IV SOLN: 250.0000 mL | INTRAVENOUS | Status: DC | PRN

## 2013-08-09 MED ORDER — SODIUM CHLORIDE 0.9 % IV SOLN
1.0000 mL/kg/h | INTRAVENOUS | Status: DC
  Administered 2013-08-10: 1 mL/kg/h via INTRAVENOUS

## 2013-08-09 MED ORDER — ASPIRIN 81 MG PO TABS: 81.0000 mg | ORAL_TABLET | Freq: Every day | ORAL | Status: DC

## 2013-08-09 MED ORDER — ATORVASTATIN CALCIUM 80 MG PO TABS
80.0000 mg | ORAL_TABLET | Freq: Every day | ORAL | Status: DC
  Administered 2013-08-09 – 2013-08-11 (×2): 80 mg via ORAL
  Filled 2013-08-09 (×4): qty 1

## 2013-08-09 MED ORDER — ONDANSETRON HCL 4 MG/2ML IJ SOLN: 4.0000 mg | Freq: Four times a day (QID) | INTRAMUSCULAR | Status: DC | PRN

## 2013-08-09 MED ORDER — METOPROLOL TARTRATE 25 MG PO TABS
25.0000 mg | ORAL_TABLET | Freq: Two times a day (BID) | ORAL | Status: DC
  Administered 2013-08-09 – 2013-08-12 (×7): 25 mg via ORAL
  Filled 2013-08-09 (×10): qty 1

## 2013-08-09 MED ORDER — NITROGLYCERIN 0.4 MG SL SUBL: 0.4000 mg | SUBLINGUAL_TABLET | SUBLINGUAL | Status: DC | PRN

## 2013-08-09 MED ORDER — ASPIRIN 81 MG PO CHEW: 324.0000 mg | CHEWABLE_TABLET | ORAL | Status: AC

## 2013-08-09 MED ORDER — HEPARIN (PORCINE) IN NACL 100-0.45 UNIT/ML-% IJ SOLN
1100.0000 [IU]/h | INTRAMUSCULAR | Status: DC
  Administered 2013-08-09: 750 [IU]/h via INTRAVENOUS
  Administered 2013-08-09: 950 [IU]/h via INTRAVENOUS
  Filled 2013-08-09 (×2): qty 250

## 2013-08-09 MED ORDER — ACETAMINOPHEN 325 MG PO TABS: 650.0000 mg | ORAL_TABLET | ORAL | Status: DC | PRN

## 2013-08-09 MED ORDER — TRIAMTERENE-HCTZ 75-50 MG PO TABS
1.0000 | ORAL_TABLET | Freq: Every day | ORAL | Status: DC
  Administered 2013-08-09 – 2013-08-12 (×3): 1 via ORAL
  Filled 2013-08-09 (×4): qty 1

## 2013-08-09 MED ORDER — SODIUM CHLORIDE 0.9 % IJ SOLN
3.0000 mL | INTRAMUSCULAR | Status: DC | PRN
  Administered 2013-08-09: 3 mL via INTRAVENOUS

## 2013-08-09 MED ORDER — IRON 325 (65 FE) MG PO TABS: 1.0000 | ORAL_TABLET | Freq: Every day | ORAL | Status: DC

## 2013-08-09 MED ORDER — POLYSACCHARIDE IRON COMPLEX 150 MG PO CAPS
150.0000 mg | ORAL_CAPSULE | Freq: Every day | ORAL | Status: DC
  Administered 2013-08-09 – 2013-08-12 (×4): 150 mg via ORAL
  Filled 2013-08-09 (×5): qty 1

## 2013-08-09 MED ORDER — SODIUM CHLORIDE 0.9 % IJ SOLN
3.0000 mL | Freq: Two times a day (BID) | INTRAMUSCULAR | Status: DC
  Administered 2013-08-09 – 2013-08-10 (×2): 3 mL via INTRAVENOUS

## 2013-08-09 MED ORDER — LISINOPRIL 5 MG PO TABS
5.0000 mg | ORAL_TABLET | Freq: Every day | ORAL | Status: DC
  Administered 2013-08-09 – 2013-08-12 (×4): 5 mg via ORAL
  Filled 2013-08-09 (×4): qty 1

## 2013-08-09 MED ORDER — ASPIRIN 81 MG PO CHEW
81.0000 mg | CHEWABLE_TABLET | ORAL | Status: AC
  Administered 2013-08-10: 81 mg via ORAL
  Filled 2013-08-09: qty 1

## 2013-08-09 MED ORDER — ASPIRIN EC 81 MG PO TBEC
81.0000 mg | DELAYED_RELEASE_TABLET | Freq: Every day | ORAL | Status: DC
  Administered 2013-08-11 – 2013-08-12 (×2): 81 mg via ORAL
  Filled 2013-08-09 (×3): qty 1

## 2013-08-09 NOTE — Progress Notes (Signed)
ANTICOAGULATION CONSULT NOTE  Pharmacy Consult for Heparin Indication: chest pain/ACS (also on Coumadin for hx DVT)  No Known Allergies  Patient Measurements: Height: 5\' 4"  (162.6 cm) Weight: 126 lb 5.2 oz (57.3 kg) IBW/kg (Calculated) : 59.2 Heparin Dosing Weight: 57.3 kg  Vital Signs: Temp: 98.3 F (36.8 C) (11/19 2000) Temp src: Oral (11/19 2000) BP: 114/75 mmHg (11/19 2000) Pulse Rate: 66 (11/19 1223)  Labs:  Recent Labs  08/09/13 0903 08/09/13 1402 08/09/13 2000  HGB  --  16.1  --   HCT  --  45.2  --   PLT  --  248  --   LABPROT  --  18.8*  --   INR 1.9 1.62*  --   HEPARINUNFRC  --   --  <0.10*  CREATININE  --  0.86  --   TROPONINI  --  1.83*  --     Estimated Creatinine Clearance: 73.1 ml/min (by C-G formula based on Cr of 0.86).  Assessment: 62 yo male admitted from cardiologist office with chest pain.  Also with history of aortic valve replacement and hx DVT on chronic Coumadin.  INR at Coumadin clinic visit today = 1.9.  Pharmacy asked to begin IV heparin.  Per patient, no history of bleeding, although noted hx of GIB in 2012 per chart notes.  First heparin level < 0.10  Goal of Therapy:  Heparin level 0.3-0.7 units/ml Monitor platelets by anticoagulation protocol: Yes   Plan:  1. Increase heparin to 950 units / hr 2. Follow up AM heparin level, CBC  Thank you. Okey Regal, PharmD [phone]  08/09/2013 9:06 PM

## 2013-08-09 NOTE — Care Management Note (Addendum)
  Page 1 of 1   08/11/2013     2:39:39 PM   CARE MANAGEMENT NOTE 08/11/2013  Patient:  KINO, DUNSWORTH   Account Number:  [id number]  Date Initiated:  08/09/2013  Documentation initiated by:  Junius Creamer  Subjective/Objective Assessment:   adm w ch pain/angina     Action/Plan:   lives w friend, pcp dr Claris Che simpson   Anticipated DC Date:  08/14/2013   Anticipated DC Plan:        DC Planning Services  CM consult      Choice offered to / List presented to:             Status of service:   Medicare Important Message given?   (If response is "NO", the following Medicare IM given date fields will be blank) Date Medicare IM given:   Date Additional Medicare IM given:    Discharge Disposition:    Per UR Regulation:  Reviewed for med. necessity/level of care/duration of stay  If discussed at Long Length of Stay Meetings, dates discussed:    Comments:  08/11/13 1130 Oletta Cohn, RN, BSN, [employer] [phone] Spoke with pt at bedside regarding benefits check for Xarelto .  Pt has brochure with 30 day free card and refill assistance card intact.  Pt utilizes [facility] in [place] for prescription needs.  NCM called pharmacy to confirm availability of medication.  Information relayed to pt.  Pt verbalizes importance of filling medication upon discharge.

## 2013-08-09 NOTE — H&P (Signed)
Patient seen in clinic by me today as outlined in the extensive office note. He was accepted in transfer by Dr. Patty Sermons, seen by [name] NP on arrival at [facility]. Further care as per our [facility] team.

## 2013-08-09 NOTE — Progress Notes (Signed)
Troponin level- 1.83. PA made aware with order.

## 2013-08-09 NOTE — H&P (Signed)
Clinical Summary  [name] is a medically complex 62 y.o.male added onto my schedule today from the Coumadin clinic. He has been followed most recently by Ms. Lawrence NP, a former patient of Dr. Dietrich Pates. He states that he has been having intermittent, worsening chest pain since Friday, described as a heaviness that "cuts off" his breath. He has had recurring symptoms this morning and mentioned it to our Coumadin nurse.  ECG done in the clinic shows sinus rhythm with left atrial enlargement, decreased R wave progression, somewhat vertical axis, and progressive ST-T wave depression in the inferolateral leads compared to prior tracing from June.  His medical history is complicated, and not well-defined in the chart. I reviewed available records back through 2011. Patient tells me that he had heart surgery in 1971. Chart indicates bioprosthetic aortic valve replacement at [facility] in 2005. No surgical report is available at this time. Last echocardiogram in 2012 indicates a stable aortic prosthesis, no obvious vegetation or perivalvular leak. LVEF 40-45%. Records suggest prosthetic valve endocarditis in 2012, although no reported vegetation. It appears that he was treated empirically with long-term antibiotics.  He is on Coumadin with prior history of DVT. He does have a history of heavy alcohol abuse, although tells that he has cut back significantly to only a few beers every few days. INR in the clinic today was 1.9. Denies any bleeding problems.  He does have a history of significant GI bleed back in 2012 as noted below.   No Known Allergies   Current Outpatient Prescriptions   Medication  Sig  Dispense  Refill   .  aspirin 81 MG tablet  Take 81 mg by mouth daily.     .  Ferrous Sulfate (IRON) 325 (65 FE) MG TABS  Take 1 tablet by mouth daily.     [name]  lisinopril (PRINIVIL,ZESTRIL) 5 MG tablet  Take 1 tablet (5 mg total) by mouth daily.  30 tablet  3   .  triamterene-hydrochlorothiazide (MAXZIDE)  75-50 MG per tablet  Take 1 tablet by mouth daily.  30 tablet  11   .  warfarin (COUMADIN) 5 MG tablet  Take 1 tablet (5 mg total) by mouth daily. Or as directed  45 tablet  6    No current facility-administered medications for this visit.    Past Medical History   Diagnosis  Date   .  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  Prosthetic valve endocarditis    .  ETOH abuse      Has cut back significantly   .  History of DVT (deep vein thrombosis)      Followed in Coumadin clinic   .  S/P aortic valve replacement      Details not clear - reportedly 1971 per patient   .  Essential hypertension, benign    .  History of GI bleed      February 2012    Past Surgical History   Procedure  Laterality  Date   .  Aoric valve replacement   January 2005     29mm Medtronic Freestyle stentless porcine bioprosthesis [facility])   .  Left leg embolectomy       May 2012 - Dr. Myra Gianotti    Family History   Problem  Relation  Age of Onset   .  Diabetes  Mother    .  Cirrhosis  Father  Social History  [name] reports that he has quit smoking. His smoking use included Cigarettes. He smoked 0.50 packs per day. He has never used smokeless tobacco.  [name] reports that he drinks about 31.2 ounces of alcohol per week.  Review of Systems  Denies palpitations or syncope. Fair appetite, occasional abdominal fullness. No orthopnea or PND. No leg edema. Otherwise as outlined above.  Physical Examination  Filed Vitals:    08/09/13 0939   BP:  182/122   Pulse:  99    Patient appears comfortable at rest.  HEENT: Conjunctiva and lids normal, disconjugate gaze, oropharynx clear with poor dentition.  Neck: Supple, elevated JVP or carotid bruits, no thyromegaly.  Lungs: Clear to auscultation, nonlabored breathing at rest.  Cardiac: Regular rate and rhythm, no S3, 2/6 diastolic murmur heard at the right base and apex, no  pericardial rub.  Abdomen: Soft, nontender, bowel sounds present, no guarding or rebound.  Extremities: No pitting edema, distal pulses 2+.  Skin: Warm and dry.  Musculoskeletal: No kyphosis.  Neuropsychiatric: Alert and oriented x3, affect grossly appropriate.   Problem List and Plan  Unstable angina  Patient presenting with symptoms suggestive of new onset progressive angina since Friday. ECG shows progressive ST/T-wave abnormalities compared to June, now more prominent depression. Also significantly hypertensive, states that he did not take his medicines as yet this morning. I am uncertain whether he has any prior documented history of obstructive CAD or not based on available information. After reviewing with the patient, plan is to have him admitted directly to [facility] in anticipation of a diagnostic cardiac catheterization for assessment of coronary anatomy. I spoke with Mr. Bruce Aliment NP regarding transfer. Patient given a dose of aspirin 81 mg and also Lopressor 25 mg in the clinic, placed on cardiac monitor with nursing followup of vital signs. INR is presently 1.9 - depending on clinical stability may be able to hold off testing until tomorrow so that INR can drift down further.  S/P aortic valve replacement  Patient states he had heart surgery in 1971, chart indicates a porcine bioprosthesis placed at [facility] in 2005. He has a diastolic murmur on examination. Particularly in light of new onset symptoms, echocardiogram will need to be obtained today for further assessment.  Prosthetic valve endocarditis  History in February 2012, presumptive diagnosis based on apparent embolic phenomena involving kidneys and spleen at that time. There was no valvular vegetation based on available records. He was treated empirically with antibiotics.  Hypertension  History of high blood pressure, significantly elevated today in the setting of recent symptoms. States has not taken medicines  yet this morning.  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  DVT  History of DVT, on Coumadin. He is followed by Dr. Lodema Hong in seen in our Coumadin clinic. Not entirely clear to me at this time whether long-term anticoagulation is clearly indicated or not.   Jonelle Sidle, M.D., F.A.C.C. _____________   Addendum:  Patient arrived to [facility] Rm [phone].  No chest pain or sob.  Hemodynamically stable.  Exam unchanged from above.  Lab up to this point:  Lab Results  Component Value Date   WBC 11.7* 08/09/2013   HGB 16.1 08/09/2013   HCT 45.2 08/09/2013   MCV 90.4 08/09/2013   PLT 248 08/09/2013   Lab Results  Component Value Date   INR 1.62* 08/09/2013  INR 1.9 08/09/2013   INR 2.9 07/19/2013   Lab Results  Component Value Date   TROPONINI 1.83* 08/09/2013   1. Nstemi: currently pain free.  Troponin elevated @ 1.83.  INR subRx.  Plan for cath in AM or sooner if c/p recurs/becomes unstable.  Cont asa, bb, statin, heparin, nitrate.  2.  HTN:  Stable.  3.  HL:  Check lipids/lft's.  Statin added in setting of #1.  4.  H/O DVT: coumadin on hold.  On heparin for now.  Need for long term anticoagulation unclear.  5.  S/p AVR:  Echo pending.  Nicolasa Ducking 08/09/2013 3:44 PM

## 2013-08-09 NOTE — Assessment & Plan Note (Signed)
History of DVT, on Coumadin. He is followed by Dr. Lodema Hong in seen in our Coumadin clinic. Not entirely clear to me at this time whether long-term anticoagulation is clearly indicated or not.

## 2013-08-09 NOTE — Assessment & Plan Note (Signed)
History in February 2012, presumptive diagnosis based on apparent embolic phenomena involving kidneys and spleen at that time. There was no valvular vegetation based on available records. He was treated empirically with antibiotics.

## 2013-08-09 NOTE — Assessment & Plan Note (Signed)
History of high blood pressure, significantly elevated today in the setting of recent symptoms. States has not taken medicines yet this morning.

## 2013-08-09 NOTE — Assessment & Plan Note (Addendum)
Patient presenting with symptoms suggestive of new onset progressive angina since Friday. ECG shows progressive ST/T-wave abnormalities compared to June, now more prominent depression. Also significantly hypertensive, states that he did not take his medicines as yet this morning. I am uncertain whether he has any prior documented history of obstructive CAD or not based on available information. After reviewing with the patient, plan is to have him admitted directly to [facility] in anticipation of a diagnostic cardiac catheterization for assessment of coronary anatomy. I spoke with [name] NP regarding transfer. Patient given a dose of aspirin 81 mg and also Lopressor 25 mg in the clinic, placed on cardiac monitor with nursing followup of vital signs. INR is presently 1.9 - depending on clinical stability may be able to hold off testing until tomorrow so that INR can drift down further.

## 2013-08-09 NOTE — Progress Notes (Signed)
ANTICOAGULATION CONSULT NOTE - Initial Consult  Pharmacy Consult for Heparin Indication: chest pain/ACS (also on Coumadin for hx DVT)  No Known Allergies  Patient Measurements: Height: 5\' 4"  (162.6 cm) Weight: 126 lb 5.2 oz (57.3 kg) IBW/kg (Calculated) : 59.2 Heparin Dosing Weight: 57.3 kg  Vital Signs: Temp: 98.1 F (36.7 C) (11/19 1223) Temp src: Oral (11/19 1223) BP: 178/110 mmHg (11/19 1230) Pulse Rate: 66 (11/19 1223)  Labs:  Recent Labs  08/09/13 0903  INR 1.9    Estimated Creatinine Clearance: 75.7 ml/min (by C-G formula based on Cr of 0.83).   Medical History: Past Medical History  Diagnosis Date  . 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 Prosthetic valve endocarditis   . ETOH abuse     Has cut back significantly  . History of DVT (deep vein thrombosis)     Followed in Coumadin clinic  . S/P aortic valve replacement     Details not clear -  reportedly 1971 per patient  . Essential hypertension, benign   . History of GI bleed     February 2012     Medications:  Scheduled:  . aspirin  324 mg Oral NOW   Or  . aspirin  300 mg Rectal NOW  . [START ON 08/10/2013] aspirin EC  81 mg Oral Daily  . atorvastatin  80 mg Oral q1800  . iron polysaccharides  150 mg Oral Daily  . lisinopril  5 mg Oral Daily  . metoprolol tartrate  25 mg Oral BID  . sodium chloride  3 mL Intravenous Q12H  . triamterene-hydrochlorothiazide  1 tablet Oral Daily    Assessment: 62 yo male admitted from cardiologist office with chest pain.  Also with history of aortic valve replacement and hx DVT on chronic Coumadin.  INR at Coumadin clinic visit today = 1.9.  Pharmacy asked to begin IV heparin.  Per patient, no history of bleeding, although noted hx of GIB in 2012 per chart notes.  Goal of Therapy:  Heparin level 0.3-0.7 units/ml Monitor platelets by anticoagulation protocol: Yes   Plan:  1. Check  baseline CBC. 2. Will start IV heparin at 750 units/hr.  No bolus. 3. Check heparin level in 6 hrs. 4. F/U plans for cardiac workup.  Tad Moore, BCPS  Clinical Pharmacist Pager [phone]  08/09/2013 1:25 PM

## 2013-08-09 NOTE — Assessment & Plan Note (Signed)
Last occurred in 2012 in the setting of hepatic artery aneurysm and possible mycotic aneurysm status post embolization.

## 2013-08-09 NOTE — Assessment & Plan Note (Signed)
Patient states he had heart surgery in 1971, chart indicates a porcine bioprosthesis placed at [facility] in 2005. He has a diastolic murmur on examination. Particularly in light of new onset symptoms, echocardiogram will need to be obtained today for further assessment.

## 2013-08-09 NOTE — Progress Notes (Signed)
Clinical Summary Mr. Wilczak is a medically complex 62 y.o.male added onto my schedule today from the Coumadin clinic. He has been followed most recently by Ms. Lawrence NP, a former patient of Dr. Dietrich Pates. He states that he has been having intermittent, worsening chest pain since Friday, described as a heaviness that "cuts off" his breath. He has had recurring symptoms this morning and mentioned it to our Coumadin nurse.  ECG done in the clinic shows sinus rhythm with left atrial enlargement, decreased R wave progression, somewhat vertical axis, and progressive ST-T wave depression in the inferolateral leads compared to prior tracing from June.  His medical history is complicated, and not well-defined in the chart. I reviewed available records back through 2011. Patient tells me that he had heart surgery in 1971. Chart indicates bioprosthetic aortic valve replacement at [facility] in 2005. No surgical report is available at this time. Last echocardiogram in 2012 indicates a stable aortic prosthesis, no obvious vegetation or perivalvular leak. LVEF 40-45%. Records suggest prosthetic valve endocarditis in 2012, although no reported vegetation. It appears that he was treated empirically with long-term antibiotics.  He is on Coumadin with prior history of DVT. He does have a history of heavy alcohol abuse, although tells that he has cut back significantly to only a few beers every few days. INR in the clinic today was 1.9. Denies any bleeding problems.  He does have a history of significant GI bleed back in 2012 as noted below.   No Known Allergies  Current Outpatient Prescriptions  Medication Sig Dispense Refill  . aspirin 81 MG tablet Take 81 mg by mouth daily.      . Ferrous Sulfate (IRON) 325 (65 FE) MG TABS Take 1 tablet by mouth daily.        [name] lisinopril (PRINIVIL,ZESTRIL) 5 MG tablet Take 1 tablet (5 mg total) by mouth daily.  30 tablet  3  . triamterene-hydrochlorothiazide (MAXZIDE) 75-50 MG  per tablet Take 1 tablet by mouth daily.  30 tablet  11  . warfarin (COUMADIN) 5 MG tablet Take 1 tablet (5 mg total) by mouth daily. Or as directed  45 tablet  6   No current facility-administered medications for this visit.    Past Medical History  Diagnosis Date  . 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 Prosthetic valve endocarditis   . ETOH abuse     Has cut back significantly  . History of DVT (deep vein thrombosis)     Followed in Coumadin clinic  . S/P aortic valve replacement     Details not clear -  reportedly 1971 per patient  . Essential hypertension, benign   . History of GI bleed     February 2012     Past Surgical History  Procedure Laterality Date  . Aoric valve replacement  January 2005    29mm Medtronic Freestyle stentless porcine bioprosthesis [facility])  . Left leg embolectomy      May 2012 - Dr. Myra Gianotti    Family History  Problem Relation Age of Onset  . Diabetes Mother   . Cirrhosis Father     Social History Mr. Knierim reports that he has quit smoking. His smoking use included Cigarettes. He smoked 0.50 packs per day. He has never used smokeless tobacco. Mr. Scribner reports that he drinks about 31.2 ounces of alcohol per week.  Review of Systems Denies palpitations or syncope. Fair appetite,  occasional abdominal fullness. No orthopnea or PND. No leg edema. Otherwise as outlined above.  Physical Examination Filed Vitals:   08/09/13 0939  BP: 182/122  Pulse: 99   Patient appears comfortable at rest. HEENT: Conjunctiva and lids normal, disconjugate gaze, oropharynx clear with poor dentition. Neck: Supple, elevated JVP or carotid bruits, no thyromegaly. Lungs: Clear to auscultation, nonlabored breathing at rest. Cardiac: Regular rate and rhythm, no S3, 2/6 diastolic murmur heard at the right base and apex, no pericardial rub. Abdomen: Soft, nontender, bowel sounds present, no  guarding or rebound. Extremities: No pitting edema, distal pulses 2+. Skin: Warm and dry. Musculoskeletal: No kyphosis. Neuropsychiatric: Alert and oriented x3, affect grossly appropriate.   Problem List and Plan   Unstable angina Patient presenting with symptoms suggestive of new onset progressive angina since Friday. ECG shows progressive ST/T-wave abnormalities compared to June, now more prominent depression. Also significantly hypertensive, states that he did not take his medicines as yet this morning. I am uncertain whether he has any prior documented history of obstructive CAD or not based on available information. After reviewing with the patient, plan is to have him admitted directly to [facility] in anticipation of a diagnostic cardiac catheterization for assessment of coronary anatomy. I spoke with [name] NP regarding transfer. Patient given a dose of aspirin 81 mg and also Lopressor 25 mg in the clinic, placed on cardiac monitor with nursing followup of vital signs. INR is presently 1.9 - depending on clinical stability may be able to hold off testing until tomorrow so that INR can drift down further.  S/P aortic valve replacement Patient states he had heart surgery in 1971, chart indicates a porcine bioprosthesis placed at [facility] in 2005. He has a diastolic murmur on examination. Particularly in light of new onset symptoms, echocardiogram will need to be obtained today for further assessment.  Prosthetic valve endocarditis History in February 2012, presumptive diagnosis based on apparent embolic phenomena involving kidneys and spleen at that time. There was no valvular vegetation based on available records. He was treated empirically with antibiotics.  Hypertension History of high blood pressure, significantly elevated today in the setting of recent symptoms. States has not taken medicines yet this morning.  History of GI bleed Last occurred in 2012 in the  setting of hepatic artery aneurysm and possible mycotic aneurysm status post embolization.  DVT History of DVT, on Coumadin. He is followed by Dr. Lodema Hong in seen in our Coumadin clinic. Not entirely clear to me at this time whether long-term anticoagulation is clearly indicated or not.    Jonelle Sidle, M.D., F.A.C.C.

## 2013-08-09 NOTE — Patient Instructions (Addendum)
Your physician recommends that you schedule a follow-up appointment in: TO BE DETERMINED, PT TRANSPORTED TO [facility] VIA [facility], COBRA FORM COMPLETED AND SIGNED BY THIS NURSE/PT/Dr. Diona Browner 11:15AM, PT [facility] FAMILY [facility]ERS PATRICIA FULP AND LINDA BOSWELL MADE AWARE PT TO BE TRANSFERRED TO [facility] PENDING BED PLACEMENT TO [facility] PER PT REQUEST

## 2013-08-10 ENCOUNTER — Encounter (HOSPITAL_COMMUNITY)
Admission: AD | Disposition: A | Discharge status: Home / Self Care | Payer: Self-pay | Source: Ambulatory Visit | Attending: Cardiology

## 2013-08-10 DIAGNOSIS — I214 Non-ST elevation (NSTEMI) myocardial infarction: Secondary | ICD-10-CM

## 2013-08-10 DIAGNOSIS — I2510 Atherosclerotic heart disease of native coronary artery without angina pectoris: Secondary | ICD-10-CM

## 2013-08-10 DIAGNOSIS — I10 Essential (primary) hypertension: Secondary | ICD-10-CM

## 2013-08-10 DIAGNOSIS — Z7901 Long term (current) use of anticoagulants: Secondary | ICD-10-CM

## 2013-08-10 DIAGNOSIS — I359 Nonrheumatic aortic valve disorder, unspecified: Secondary | ICD-10-CM

## 2013-08-10 DIAGNOSIS — I82409 Acute embolism and thrombosis of unspecified deep veins of unspecified lower extremity: Secondary | ICD-10-CM

## 2013-08-10 DIAGNOSIS — K922 Gastrointestinal hemorrhage, unspecified: Secondary | ICD-10-CM

## 2013-08-10 DIAGNOSIS — I369 Nonrheumatic tricuspid valve disorder, unspecified: Secondary | ICD-10-CM

## 2013-08-10 HISTORY — PX: LEFT HEART CATHETERIZATION WITH CORONARY ANGIOGRAM: SHX5451

## 2013-08-10 LAB — CBC
HCT: 41.7 % (ref 39.0–52.0)
MCHC: 35 g/dL (ref 30.0–36.0)
MCV: 89.9 fL (ref 78.0–100.0)
Platelets: 246 10*3/uL (ref 150–400)
RDW: 12.9 % (ref 11.5–15.5)
WBC: 10.9 10*3/uL — ABNORMAL HIGH (ref 4.0–10.5)

## 2013-08-10 LAB — TSH: TSH: 3.034 u[IU]/mL (ref 0.350–4.500)

## 2013-08-10 LAB — TROPONIN I: Troponin I: 1.59 ng/mL (ref <0.30)

## 2013-08-10 LAB — PROTIME-INR
INR: 1.3 (ref 0.00–1.49)
Prothrombin Time: 15.9 seconds — ABNORMAL HIGH (ref 11.6–15.2)

## 2013-08-10 LAB — LIPID PANEL
LDL Cholesterol: 134 mg/dL — ABNORMAL HIGH (ref 0–99)
Triglycerides: 92 mg/dL (ref <150)
VLDL: 18 mg/dL (ref 0–40)

## 2013-08-10 LAB — HEPATIC FUNCTION PANEL
ALT: 9 U/L (ref 0–53)
AST: 33 U/L (ref 0–37)
Albumin: 3.9 g/dL (ref 3.5–5.2)
Alkaline Phosphatase: 69 U/L (ref 39–117)
Bilirubin, Direct: 0.2 mg/dL (ref 0.0–0.3)
Indirect Bilirubin: 2.2 mg/dL — ABNORMAL HIGH (ref 0.3–0.9)
Total Bilirubin: 2.4 mg/dL — ABNORMAL HIGH (ref 0.3–1.2)
Total Protein: 7.3 g/dL (ref 6.0–8.3)

## 2013-08-10 LAB — BASIC METABOLIC PANEL
Calcium: 9 mg/dL (ref 8.4–10.5)
GFR calc Af Amer: 87 mL/min — ABNORMAL LOW (ref >90)
GFR calc non Af Amer: 75 mL/min — ABNORMAL LOW (ref >90)
Glucose, Bld: 96 mg/dL (ref 70–99)
Potassium: 4.2 mEq/L (ref 3.5–5.1)
Sodium: 133 mEq/L — ABNORMAL LOW (ref 135–145)

## 2013-08-10 SURGERY — LEFT HEART CATHETERIZATION WITH CORONARY ANGIOGRAM
Anesthesia: LOCAL

## 2013-08-10 MED ORDER — MIDAZOLAM HCL 2 MG/2ML IJ SOLN
INTRAMUSCULAR | Status: AC
  Filled 2013-08-10: qty 2

## 2013-08-10 MED ORDER — HEPARIN (PORCINE) IN NACL 100-0.45 UNIT/ML-% IJ SOLN
1100.0000 [IU]/h | INTRAMUSCULAR | Status: DC
  Administered 2013-08-10 – 2013-08-11 (×2): 1100 [IU]/h via INTRAVENOUS
  Filled 2013-08-10 (×4): qty 250

## 2013-08-10 MED ORDER — SODIUM CHLORIDE 0.9 % IV SOLN: 1.0000 mL/kg/h | INTRAVENOUS | Status: AC

## 2013-08-10 MED ORDER — VERAPAMIL HCL 2.5 MG/ML IV SOLN
INTRAVENOUS | Status: AC
  Filled 2013-08-10: qty 2

## 2013-08-10 MED ORDER — HEPARIN (PORCINE) IN NACL 2-0.9 UNIT/ML-% IJ SOLN
INTRAMUSCULAR | Status: AC
  Filled 2013-08-10: qty 1500

## 2013-08-10 MED ORDER — CLOPIDOGREL BISULFATE 300 MG PO TABS
ORAL_TABLET | ORAL | Status: AC
  Filled 2013-08-10: qty 1

## 2013-08-10 MED ORDER — HEPARIN SODIUM (PORCINE) 1000 UNIT/ML IJ SOLN
INTRAMUSCULAR | Status: AC
  Filled 2013-08-10: qty 1

## 2013-08-10 MED ORDER — NITROGLYCERIN 0.2 MG/ML ON CALL CATH LAB
INTRAVENOUS | Status: AC
  Filled 2013-08-10: qty 1

## 2013-08-10 MED ORDER — LIDOCAINE HCL (PF) 1 % IJ SOLN
INTRAMUSCULAR | Status: AC
  Filled 2013-08-10: qty 30

## 2013-08-10 MED ORDER — FENTANYL CITRATE 0.05 MG/ML IJ SOLN
INTRAMUSCULAR | Status: AC
  Filled 2013-08-10: qty 2

## 2013-08-10 NOTE — Progress Notes (Signed)
Echo Lab  2D Echocardiogram completed.  Sha Amer L Mita Vallo, RDCS 08/10/2013 10:02 AM

## 2013-08-10 NOTE — CV Procedure (Signed)
Cardiac Catheterization Procedure Note  Name: [name] MRN: [id number] DOB: Aug 08, 1951  Procedure: Left Heart Cath, Selective Coronary Angiography, Ao root angiography, LV angiography, PTCA and stenting of the mid-RCA  Indication: NSTEMI. 62 year old a patient with complex medical history. He has had 2 previous heart surgeries with bioprosthetic aortic valve replacement most recently in 2005. None of those records have been available. He presented with a non-ST elevation MI. The patient has also been on long-term warfarin. He has a history of alcoholism and continues to drink daily. He's had recurrent chest pain even on IV heparin and nitroglycerin in the hospital. His troponin is elevated and he presents for cath and possible PCI.  Procedural Details:  The right wrist was prepped, draped, and anesthetized with 1% lidocaine. Using the modified Seldinger technique, a 5/6 French sheath was introduced into the right radial artery. 3 mg of verapamil was administered through the sheath, weight-based unfractionated heparin was administered intravenously. The aortic root was severely calcified, especially at the level of the sinuses. I was unable to engage coronaries with standard Judkins catheters. A pigtail catheter was advanced across the valve into the left ventricle and left ventriculography was performed. The pigtail catheter was pulled back an aortic root angiography was performed in order to visualize the coronary ostia. I was ultimately able to engage the native right coronary artery with an AL-1 catheter and the left coronary artery with a JL 5 catheter. Catheter exchanges were performed over an exchange length guidewire.  PROCEDURAL FINDINGS Hemodynamics: AO 84/51 with a mean of 67 LV 83/9   Coronary angiography: Coronary dominance: right  Left mainstem: The left main is patent. It arises from the left cusp and there is no obstructive disease.  Left anterior descending (LAD):  The LAD is patent to the distal anterior wall. There is diffuse irregularity with mild 30-40% stenosis in the mid vessel. The first diagonal is a large caliber vessel with no significant disease. The ostium has 20-30% stenosis.  Left circumflex (LCx): There is a large intermediate branch with no significant stenosis. The AV circumflex is very small with no major branch vessels.  Right coronary artery (RCA): The RCA has a anterior origin with the unusual take off of the vessel. The vessel is tortuous. There is a shepherd's crook at the proximal part of the vessel. There is mild 30-40% stenosis through that region. Further down in the mid vessel there is 95% stenosis with hypodensity suggestive of intraluminal thrombus. The distal vessel has nonobstructive 40% stenosis. The PDA and PLA branches are patent.  Left ventriculography: Left ventricular systolic function is mildly reduced. There is hypokinesis of the inferior wall. The estimated left ventricular ejection fraction is 45%.  PCI Note:  Following the diagnostic procedure, the decision was made to proceed with PCI of the severe lesion in the mid RCA.  This was clearly the patient's culprit lesion. Weight-based heparin was given for anticoagulation. Once a therapeutic ACT was achieved, a 6 [place] AL-1 guide catheter was inserted.  A was coronary guidewire was used to cross the lesion.  The lesion was predilated with a 2.5 x 15 mm balloon.  The lesion was then stented with a 3.5 x 18 mm vision bare metal stent.  The stent was postdilated with a 3.75 mm noncompliant balloon to 18 atmospheres.  Following PCI, there was 0% residual stenosis and TIMI-3 flow. Final angiography confirmed an excellent result. The patient tolerated the procedure well. There were no immediate procedural complications. A  TR band was used for radial hemostasis. The patient was transferred to the post catheterization recovery area for further monitoring.  PCI Data: Vessel -  RCA/Segment - mid Percent Stenosis (pre)  95 TIMI-flow 3 Stent 3.5 x 18 mm vision bare-metal Percent Stenosis (post) 0 TIMI-flow (post) 3  Final Conclusions:   1. Mild segmental left ventricular systolic dysfunction 2. Severe single-vessel coronary artery disease involving the mid right coronary artery 3. Nonobstructive LAD stenosis 4. Patency of the left circumflex 5. Severe calcification of the aortic root   Recommendations:  The patient has an indication for long-term Coumadin. He has a history of both venous and arterial thrombosis. I reviewed a hematology note from 2012 where long-term anticoagulation was recommended. I would recommend Coumadin and aspirin 81 mg indefinitely. Plavix should be used for 30 days and I would limit its use to that duration.  [name] 08/10/2013, 5:53 PM

## 2013-08-10 NOTE — Progress Notes (Signed)
  Patient Name: [name] Date of Encounter: 08/10/2013  Active Problems:   * No active hospital problems. * NSTEMI    Length of Stay: 1  SUBJECTIVE  The patient reports that his last chest pain was last night at 11 pm and it was relieved by NTG. Currently pain free, fasting, waiting for a cath.  CURRENT MEDS . aspirin  324 mg Oral NOW   Or  . aspirin  300 mg Rectal NOW  . aspirin  81 mg Oral Pre-Cath  . aspirin EC  81 mg Oral Daily  . atorvastatin  80 mg Oral q1800  . iron polysaccharides  150 mg Oral Daily  . lisinopril  5 mg Oral Daily  . metoprolol tartrate  25 mg Oral BID  . nitroGLYCERIN  1 inch Topical Q6H  . sodium chloride  3 mL Intravenous Q12H  . sodium chloride  3 mL Intravenous Q12H  . triamterene-hydrochlorothiazide  1 tablet Oral Daily    OBJECTIVE  Filed Vitals:   08/10/13 0400 08/10/13 0435 08/10/13 0600 08/10/13 0715  BP: 99/75  106/58 90/60  Pulse: 60   79  Temp: 98.1 F (36.7 C)   98.1 F (36.7 C)  TempSrc: Oral   Oral  Resp: 15  15 15  Height:      Weight:  129 lb 13.6 oz (58.9 kg)    SpO2: 98%  100% 98%    Intake/Output Summary (Last 24 hours) at 08/10/13 0836 Last data filed at 08/10/13 0710  Gross per 24 hour  Intake 722.11 ml  Output    701 ml  Net  21.11 ml   Filed Weights   08/09/13 1223 08/10/13 0435  Weight: 126 lb 5.2 oz (57.3 kg) 129 lb 13.6 oz (58.9 kg)    PHYSICAL EXAM  General: Pleasant, NAD. Neuro: Alert and oriented X 3. Moves all extremities spontaneously. Psych: Normal affect. HEENT:  Normal  Neck: Supple without bruits or JVD. Lungs:  Resp regular and unlabored, CTA. Heart: RRR no s3, s4, or murmurs. Abdomen: Soft, non-tender, non-distended, BS + x 4.  Extremities: No clubbing, cyanosis or edema. DP/PT/Radials 2+ and equal bilaterally.  Accessory Clinical Findings  CBC  Recent Labs  08/09/13 1402 08/10/13 0500  WBC 11.7* 10.9*  NEUTROABS 7.1  --   HGB 16.1 14.6  HCT 45.2 41.7  MCV 90.4  89.9  PLT 248 246   Basic Metabolic Panel  Recent Labs  08/09/13 1402 08/10/13 0500  NA 138 133*  K 4.2 4.2  CL 99 98  CO2 24 22  GLUCOSE 89 96  BUN 11 16  CREATININE 0.86 1.05  CALCIUM 9.1 9.0   Liver Function Tests  Recent Labs  08/09/13 1402  AST 33  ALT 10  ALKPHOS 76  BILITOT 2.2*  PROT 8.1  ALBUMIN 4.2   No results found for this basename: LIPASE, AMYLASE,  in the last 72 hours Cardiac Enzymes  Recent Labs  08/09/13 1402 08/09/13 2157 08/09/13 2350  TROPONINI 1.83* 1.44* 1.59*   Fasting Lipid Panel  Recent Labs  08/10/13 0500  CHOL 210*  HDL 58  LDLCALC 134*  TRIG 92  CHOLHDL 3.6   Thyroid Function Tests  Recent Labs  08/09/13 2157  TSH 3.034   Radiology/Studies  Dg Chest Port 1 View IMPRESSION: No active disease.   Electronically Signed   By: Hector  Cooper M.D.   On: 08/09/2013 16:25   TELE  SR, significant ST depressions    ASSESSMENT   AND PLAN  1. NSTEMI -  The patient presented with typical chest pain since Friday. ECG shows progressive ST/T-wave abnormalities compared to June, now more prominent depression. Also significantly hypertensive. It is unclear if he has any prior CAD.  - continue ASA, Heparin drip, lopressor, lisinopril - no statin, h/o heavy etoh abuse, we will check LFT and if normal start highly potent statin  2. S/P aortic valve replacement (porcine valve placed at [facility] in 2005, unknown reason), S/p prosthetic valve endocarditis 2012 treated with ATB only. Last echocardiogram in 2012 indicates a stable aortic prosthesis, no obvious vegetation or perivalvular leak. LVEF 40-45%.  3. Hypertension  History of high blood pressure, significantly elevated yesterday, but he was not compliant with his meds, now controlled after restarting Lopressor and Lisinopril  4. 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   5. DVT    History of DVT, on Coumadin. He is followed by Dr. Simpson in seen in our Coumadin clinic. Not entirely clear to me at this time whether long-term anticoagulation is clearly indicated or not.    Signed, Chester Romero, H MD, FACC 08/10/2013   

## 2013-08-10 NOTE — Progress Notes (Signed)
ANTICOAGULATION CONSULT NOTE - Follow up  Pharmacy Consult for Heparin Indication: hx DVT; s/p left heart cath   No Known Allergies  Patient Measurements: Height: 5\' 4"  (162.6 cm) Weight: 129 lb 13.6 oz (58.9 kg) IBW/kg (Calculated) : 59.2 Heparin Dosing Weight: 57.3 kg  Vital Signs: Temp: 98.1 F (36.7 C) (11/20 1209) Temp src: Oral (11/20 1209) BP: 113/76 mmHg (11/20 1209) Pulse Rate: 62 (11/20 1209)  Labs:  Recent Labs  08/09/13 0903 08/09/13 1402 08/09/13 2000 08/09/13 2157 08/09/13 2350 08/10/13 0500 08/10/13 0855  HGB  --  16.1  --   --   --  14.6  --   HCT  --  45.2  --   --   --  41.7  --   PLT  --  248  --   --   --  246  --   LABPROT  --  18.8*  --   --   --   --  15.9*  INR 1.9 1.62*  --   --   --   --  1.30  HEPARINUNFRC  --   --  <0.10*  --   --  0.23*  --   CREATININE  --  0.86  --   --   --  1.05  --   TROPONINI  --  1.83*  --  1.44* 1.59*  --   --     Estimated Creatinine Clearance: 61.5 ml/min (by C-G formula based on Cr of 1.05).   Medical History: Past Medical History  Diagnosis Date  . 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 Prosthetic valve endocarditis   . ETOH abuse     Has cut back significantly  . History of DVT (deep vein thrombosis)     Followed in Coumadin clinic  . S/P aortic valve replacement     Details not clear -  reportedly 1971 per patient  . Essential hypertension, benign   . History of GI bleed     February 2012     Medications:  Scheduled:  . [facility] HOLD] aspirin EC  81 mg Oral Daily  . [facility] HOLD] atorvastatin  80 mg Oral q1800  . [facility] HOLD] iron polysaccharides  150 mg Oral Daily  . [[facility] HOLD] lisinopril  5 mg Oral Daily  . [facility] HOLD] metoprolol tartrate  25 mg Oral BID  . [facility] HOLD] nitroGLYCERIN  1 inch Topical Q6H  . [facility] HOLD] sodium chloride  3 mL Intravenous Q12H  . sodium chloride  3 mL Intravenous Q12H  . [facility] HOLD]  triamterene-hydrochlorothiazide  1 tablet Oral Daily    Assessment: 62 yo male admitted from cardiologist office with chest pain.  Also with history of aortic valve replacement and hx DVT on chronic Coumadin.  INR at Coumadin clinic visit today = 1.9. Noted hx of GIB in 2012 per chart notes. S/p left heart cath. Sheath removed at 1741 per cath lab. No bleeding noted   Goal of Therapy:  Heparin level 0.3-0.7 units/ml Monitor platelets by anticoagulation protocol: Yes   Plan:  1. Restart IV heparin at 1100 units/hr 4 hours post sheath removal. No bolus  2. F/U AM heparin level 4. F/U on long term anticoagulation plans   Vinnie Level, PharmD.  Clinical Pharmacist Pager [phone]

## 2013-08-10 NOTE — H&P (View-Only) (Signed)
Patient Name: [name] Date of Encounter: 08/10/2013  Active Problems:   * No active hospital problems. * NSTEMI    Length of Stay: 1  SUBJECTIVE  The patient reports that his last chest pain was last night at 11 pm and it was relieved by NTG. Currently pain free, fasting, waiting for a cath.  CURRENT MEDS . aspirin  324 mg Oral NOW   Or  . aspirin  300 mg Rectal NOW  . aspirin  81 mg Oral Pre-Cath  . aspirin EC  81 mg Oral Daily  . atorvastatin  80 mg Oral q1800  . iron polysaccharides  150 mg Oral Daily  . lisinopril  5 mg Oral Daily  . metoprolol tartrate  25 mg Oral BID  . nitroGLYCERIN  1 inch Topical Q6H  . sodium chloride  3 mL Intravenous Q12H  . sodium chloride  3 mL Intravenous Q12H  . triamterene-hydrochlorothiazide  1 tablet Oral Daily    OBJECTIVE  Filed Vitals:   08/10/13 0400 08/10/13 0435 08/10/13 0600 08/10/13 0715  BP: 99/75  106/58 90/60  Pulse: 60   79  Temp: 98.1 F (36.7 C)   98.1 F (36.7 C)  TempSrc: Oral   Oral  Resp: 15  15 15   Height:      Weight:  129 lb 13.6 oz (58.9 kg)    SpO2: 98%  100% 98%    Intake/Output Summary (Last 24 hours) at 08/10/13 0836 Last data filed at 08/10/13 0710  Gross per 24 hour  Intake 722.11 ml  Output    701 ml  Net  21.11 ml   Filed Weights   08/09/13 1223 08/10/13 0435  Weight: 126 lb 5.2 oz (57.3 kg) 129 lb 13.6 oz (58.9 kg)    PHYSICAL EXAM  General: Pleasant, NAD. Neuro: Alert and oriented X 3. Moves all extremities spontaneously. Psych: Normal affect. HEENT:  Normal  Neck: Supple without bruits or JVD. Lungs:  Resp regular and unlabored, CTA. Heart: RRR no s3, s4, or murmurs. Abdomen: Soft, non-tender, non-distended, BS + x 4.  Extremities: No clubbing, cyanosis or edema. DP/PT/Radials 2+ and equal bilaterally.  Accessory Clinical Findings  CBC  Recent Labs  08/09/13 1402 08/10/13 0500  WBC 11.7* 10.9*  NEUTROABS 7.1  --   HGB 16.1 14.6  HCT 45.2 41.7  MCV 90.4  89.9  PLT 248 246   Basic Metabolic Panel  Recent Labs  08/09/13 1402 08/10/13 0500  NA 138 133*  K 4.2 4.2  CL 99 98  CO2 24 22  GLUCOSE 89 96  BUN 11 16  CREATININE 0.86 1.05  CALCIUM 9.1 9.0   Liver Function Tests  Recent Labs  08/09/13 1402  AST 33  ALT 10  ALKPHOS 76  BILITOT 2.2*  PROT 8.1  ALBUMIN 4.2   No results found for this basename: LIPASE, AMYLASE,  in the last 72 hours Cardiac Enzymes  Recent Labs  08/09/13 1402 08/09/13 2157 08/09/13 2350  TROPONINI 1.83* 1.44* 1.59*   Fasting Lipid Panel  Recent Labs  08/10/13 0500  CHOL 210*  HDL 58  LDLCALC 134*  TRIG 92  CHOLHDL 3.6   Thyroid Function Tests  Recent Labs  08/09/13 2157  TSH 3.034   Radiology/Studies  Dg Chest Port 1 View IMPRESSION: No active disease.   Electronically Signed   By: Salome Holmes M.D.   On: 08/09/2013 16:25   TELE  SR, significant ST depressions    ASSESSMENT  AND PLAN  1. NSTEMI -  The patient presented with typical chest pain since Friday. ECG shows progressive ST/T-wave abnormalities compared to June, now more prominent depression. Also significantly hypertensive. It is unclear if he has any prior CAD.  - continue ASA, Heparin drip, lopressor, lisinopril - no statin, h/o heavy etoh abuse, we will check LFT and if normal start highly potent statin  2. S/P aortic valve replacement (porcine valve placed at [facility] in 2005, unknown reason), S/p prosthetic valve endocarditis 2012 treated with ATB only. Last echocardiogram in 2012 indicates a stable aortic prosthesis, no obvious vegetation or perivalvular leak. LVEF 40-45%.  3. Hypertension  History of high blood pressure, significantly elevated yesterday, but he was not compliant with his meds, now controlled after restarting Lopressor and Lisinopril  4. 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   5. DVT    History of DVT, on Coumadin. He is followed by Dr. Lodema Hong in seen in our Coumadin clinic. Not entirely clear to me at this time whether long-term anticoagulation is clearly indicated or not.    Signed, [name] Alexander, H MD, [facility] 08/10/2013

## 2013-08-10 NOTE — Progress Notes (Signed)
ANTICOAGULATION CONSULT NOTE  Pharmacy Consult for Heparin Indication:  hx DVT  No Known Allergies  Patient Measurements: Height: 5\' 4"  (162.6 cm) Weight: 129 lb 13.6 oz (58.9 kg) IBW/kg (Calculated) : 59.2 Heparin Dosing Weight: 57.3 kg  Vital Signs: Temp: 98.1 F (36.7 C) (11/20 0400) Temp src: Oral (11/20 0400) BP: 106/58 mmHg (11/20 0600) Pulse Rate: 60 (11/20 0400)  Labs:  Recent Labs  08/09/13 0903 08/09/13 1402 08/09/13 2000 08/09/13 2157 08/09/13 2350 08/10/13 0500  HGB  --  16.1  --   --   --  14.6  HCT  --  45.2  --   --   --  41.7  PLT  --  248  --   --   --  246  LABPROT  --  18.8*  --   --   --   --   INR 1.9 1.62*  --   --   --   --   HEPARINUNFRC  --   --  <0.10*  --   --  0.23*  CREATININE  --  0.86  --   --   --  1.05  TROPONINI  --  1.83*  --  1.44* 1.59*  --     Estimated Creatinine Clearance: 61.5 ml/min (by C-G formula based on Cr of 1.05).  Assessment: 62 yo male with chest pain, Coumadin on hold, for heparin  Goal of Therapy:  Heparin level 0.3-0.7 units/ml Monitor platelets by anticoagulation protocol: Yes   Plan:  Increase Heparin 1100 units/hr F/U after cath  Geannie Risen, PharmD, BCPS   08/10/2013 7:06 AM

## 2013-08-10 NOTE — Interval H&P Note (Signed)
History and Physical Interval Note:  08/10/2013 4:16 PM  [name]  has presented today for surgery, with the diagnosis of Chest pain  The various methods of treatment have been discussed with the patient and family. After consideration of risks, benefits and other options for treatment, the patient has consented to  Procedure(s): LEFT HEART CATHETERIZATION WITH CORONARY ANGIOGRAM (N/A) as a surgical intervention .  The patient's history has been reviewed, patient examined, no change in status, stable for surgery.  I have reviewed the patient's chart and labs.  Questions were answered to the patient's satisfaction.    Cath Lab Visit (complete for each Cath Lab visit)  Clinical Evaluation Leading to the Procedure:   ACS: yes  Non-ACS:    Anginal Classification: CCS IV  Anti-ischemic medical therapy: Maximal Therapy (2 or more classes of medications)  Non-Invasive Test Results: No non-invasive testing performed  Prior CABG: No previous CABG        [name]

## 2013-08-11 ENCOUNTER — Encounter (HOSPITAL_COMMUNITY): Payer: Self-pay | Admitting: Physician Assistant

## 2013-08-11 ENCOUNTER — Ambulatory Visit: Payer: Medicare Other | Admitting: Cardiology

## 2013-08-11 ENCOUNTER — Other Ambulatory Visit: Payer: Self-pay

## 2013-08-11 ENCOUNTER — Telehealth: Payer: Self-pay | Admitting: *Deleted

## 2013-08-11 DIAGNOSIS — I214 Non-ST elevation (NSTEMI) myocardial infarction: Secondary | ICD-10-CM

## 2013-08-11 DIAGNOSIS — Z954 Presence of other heart-valve replacement: Secondary | ICD-10-CM

## 2013-08-11 DIAGNOSIS — Z8719 Personal history of other diseases of the digestive system: Secondary | ICD-10-CM

## 2013-08-11 LAB — CBC
HCT: 39.9 % (ref 39.0–52.0)
MCV: 90.9 fL (ref 78.0–100.0)
RBC: 4.39 MIL/uL (ref 4.22–5.81)
WBC: 10.5 10*3/uL (ref 4.0–10.5)

## 2013-08-11 LAB — BASIC METABOLIC PANEL
CO2: 22 mEq/L (ref 19–32)
Calcium: 8.6 mg/dL (ref 8.4–10.5)
Chloride: 103 mEq/L (ref 96–112)
Glucose, Bld: 85 mg/dL (ref 70–99)
Potassium: 3.9 mEq/L (ref 3.5–5.1)
Sodium: 136 mEq/L (ref 135–145)

## 2013-08-11 LAB — HEPARIN LEVEL (UNFRACTIONATED): Heparin Unfractionated: 0.35 IU/mL (ref 0.30–0.70)

## 2013-08-11 LAB — PROTIME-INR
INR: 1.23 (ref 0.00–1.49)
Prothrombin Time: 15.2 seconds (ref 11.6–15.2)

## 2013-08-11 MED ORDER — SODIUM CHLORIDE 0.9 % IJ SOLN: 3.0000 mL | INTRAMUSCULAR | Status: DC | PRN

## 2013-08-11 MED ORDER — SODIUM CHLORIDE 0.9 % IV SOLN: 250.0000 mL | INTRAVENOUS | Status: DC | PRN

## 2013-08-11 MED ORDER — SODIUM CHLORIDE 0.9 % IJ SOLN: 3.0000 mL | Freq: Two times a day (BID) | INTRAMUSCULAR | Status: DC

## 2013-08-11 MED ORDER — OXYCODONE-ACETAMINOPHEN 5-325 MG PO TABS: 1.0000 | ORAL_TABLET | ORAL | Status: DC | PRN

## 2013-08-11 MED ORDER — WARFARIN SODIUM 7.5 MG PO TABS
7.5000 mg | ORAL_TABLET | Freq: Once | ORAL | Status: AC
  Administered 2013-08-11: 20:00:00 7.5 mg via ORAL
  Filled 2013-08-11: qty 1

## 2013-08-11 MED ORDER — WARFARIN - PHARMACIST DOSING INPATIENT: Freq: Every day | Status: DC

## 2013-08-11 MED ORDER — ACETAMINOPHEN 325 MG PO TABS: 650.0000 mg | ORAL_TABLET | ORAL | Status: DC | PRN

## 2013-08-11 MED ORDER — CLOPIDOGREL BISULFATE 75 MG PO TABS
75.0000 mg | ORAL_TABLET | Freq: Every day | ORAL | Status: DC
  Administered 2013-08-11 – 2013-08-12 (×2): 75 mg via ORAL
  Filled 2013-08-11 (×2): qty 1

## 2013-08-11 NOTE — Progress Notes (Addendum)
Patient Name: [name] Date of Encounter: 08/11/2013     Active Problems:   NSTEMI (non-ST elevated myocardial infarction)    SUBJECTIVE Patient is seen sitting up eating today. He has been walking the halls and feels fine. He has no CP, SOB, palpitations, jaw or arm pain, diaphoresis, lightheadedness, dizziness. He has a cough and congestion from a recent cold. He denies tingling or pain in his right hand. He states that he wants to go home, but understands he needs his coumadin needs figured out.    CURRENT MEDS . aspirin EC  81 mg Oral Daily  . atorvastatin  80 mg Oral q1800  . clopidogrel  75 mg Oral Q breakfast  . iron polysaccharides  150 mg Oral Daily  . lisinopril  5 mg Oral Daily  . metoprolol tartrate  25 mg Oral BID  . sodium chloride  3 mL Intravenous Q12H  . triamterene-hydrochlorothiazide  1 tablet Oral Daily    OBJECTIVE  Filed Vitals:   08/11/13 0400 08/11/13 0500 08/11/13 0800 08/11/13 0831  BP: 81/48  104/68 156/113  Pulse: 65   85  Temp: 98.8 F (37.1 C)   98.2 F (36.8 C)  TempSrc: Oral   Oral  Resp: 18   18  Height:      Weight:  130 lb 4.7 oz (59.1 kg)    SpO2: 97%   97%    Intake/Output Summary (Last 24 hours) at 08/11/13 0934 Last data filed at 08/10/13 2300  Gross per 24 hour  Intake  581.5 ml  Output    600 ml  Net  -18.5 ml   Filed Weights   08/09/13 1223 08/10/13 0435 08/11/13 0500  Weight: 126 lb 5.2 oz (57.3 kg) 129 lb 13.6 oz (58.9 kg) 130 lb 4.7 oz (59.1 kg)    PHYSICAL EXAM  General: Pleasant, NAD. Neuro: Alert and oriented X 3. Moves all extremities spontaneously. Psych: Normal affect. HEENT:  Normal  Neck: Supple without bruits or JVD. Lungs:  [place] Heart: RRR no s3, s4, or murmurs. Abdomen: Soft, non-tender, non-distended, BS + x 4.  Extremities: No clubbing, cyanosis or edema. DP/PT/Radials 2+ and equal bilaterally. Good pulses at the right hand  Accessory Clinical Findings  CBC  Recent Labs  08/09/13 1402 08/10/13 0500 08/11/13 0550  WBC 11.7* 10.9* 10.5  NEUTROABS 7.1  --   --   HGB 16.1 14.6 13.9  HCT 45.2 41.7 39.9  MCV 90.4 89.9 90.9  PLT 248 246 222   Basic Metabolic Panel  Recent Labs  08/10/13 0500 08/11/13 0550  NA 133* 136  K 4.2 3.9  CL 98 103  CO2 22 22  GLUCOSE 96 85  BUN 16 14  CREATININE 1.05 0.87  CALCIUM 9.0 8.6   Liver Function Tests  Recent Labs  08/09/13 1402 08/10/13 0930  AST 33 33  ALT 10 9  ALKPHOS 76 69  BILITOT 2.2* 2.4*  PROT 8.1 7.3  ALBUMIN 4.2 3.9    Cardiac Enzymes  Recent Labs  08/09/13 1402 08/09/13 2157 08/09/13 2350  TROPONINI 1.83* 1.44* 1.59*   Recent Labs  08/10/13 0500  CHOL 210*  HDL 58  LDLCALC 134*  TRIG 92  CHOLHDL 3.6   Thyroid Function Tests  Recent Labs  08/09/13 2157  TSH 3.034    TELE  1st deg AV block, HR 78  ECG 08/11/13 Sinus rhythm with 1st degree A-V block Left axis deviation ST & T wave abnormality, consider inferolateral  ischemia Prolonged QT Abnormal ECG  Cath 08/10/13  PCI Data:  Vessel - RCA/Segment - mid  Percent Stenosis (pre) 95  TIMI-flow 3   3.5 x 18 Vision  BMS to mid RCA Percent Stenosis (post) 0  TIMI-flow (post) 3  Final Conclusions:  1. Mild segmental left ventricular systolic dysfunction  2. Severe single-vessel coronary artery disease involving the mid right coronary artery  3. Nonobstructive LAD stenosis  4. Patency of the left circumflex  5. Severe calcification of the aortic root  Recommendations:  The patient has an indication for long-term Coumadin. He has a history of both venous and arterial thrombosis. I reviewed a hematology note from 2012 where long-term anticoagulation was recommended. I would recommend Coumadin and aspirin 81 mg indefinitely. Plavix should be used for 30 days and I would limit its use to that duration.    TTE 08/10/13  Study Conclusions  - Left ventricle: The cavity size was normal. Wall thickness was  increased in a pattern of mild LVH. Systolic function was normal. The estimated ejection fraction was in the range of 55% to 60%. - Aortic valve: Apparently has had a porcine AVR. Non coronary cusp calcified. No AS and trivial AR No perivalvular leak - Atrial septum: No defect or patent foramen ovale was identified.  Dg Chest Port 1 View  08/09/2013   CLINICAL DATA:  Chest pain.  EXAM: PORTABLE CHEST - 1 VIEW  COMPARISON:  02/05/2011  FINDINGS: The cardiac silhouette is enlarged. Aorta is tortuous and ectatic. Patient is status post median sternotomy. EKG leads are appreciated about the chest. There are no focal regions of consolidation or focal infiltrates. There is no evidence of pleural effusions. The osseous structures are grossly unremarkable.  IMPRESSION: No active disease.   Electronically Signed   By: Salome Holmes M.D.   On: 08/09/2013 16:25    ASSESSMENT AND PLAN  62 year old a patient with complex medical history, including 2 previous heart surgeries with bioprosthetic aortic valve replacement most recently in 2005, HTN, hx of DVT- on coumadin, continued alcohol abuse and GI bleed 2/2 hepatic artery aneurism (2012), who presented with a non-ST elevation MI and s/p PCI with placement of a BMS to the mid RCA on 08/10/13.   1. NSTEMI - patient s/p PCI with 3.5 x 18  BMS to mid RCA 08/10/13 -coumadin and aspririn, plavix 1 mo - were hesitant to start statin with heavy etoh abuse, but LFT's normal so started high potentency statin.  -patient has no CP  2. Hypertension  -BP elevated on admission. He is not compliant with his meds, better controlled after restarting Lopressor and Lisinopril.  3. Hx of DVT  -history of DVT, on Coumadin. He is on long term anticoagulation and is followed in our Coumadin clinic. He is followed by Dr. Lodema Hong. Currently on a heparin bridge per pharmacy. Will follow INR and encourage ambulation.   -consulted care mgmt to help sort out insurance coverage  with regards to his anticoagulation needs. If we can get medications covered Dr Delton See has deemed him ready for discharge  4. S/P aortic valve replacement (porcine valve placed at [facility] in 2005, unknown reason), S/p prosthetic valve endocarditis 2012 treated with ATB only. ECHO on this admission indicates a stable aortic prosthesis, no obvious vegetation or perivalvular leak.  EF 55-60%  5. History of GI bleed  Last occurred in 2012 in the setting of hepatic artery aneurysm and possible mycotic aneurysm status post embolization in 2012. Stable   Signed,  STERN, Telia Amundson PA-C  There was discussion about converting Mr. Kinley from coumadin to Xarelto for anticoagulation. Patient preferred to stay on Coumadin as he has been on it for years and is affordable and familiar to him. Patient was cleared to be discharged today on Coumadin with close follow up in Coumadin clinic, however, patient depends on sister for transportation. She says it would be difficult to pick him up today and tomorrow would be better. Will keep him overnight and continue his heparin bridging until discharge in the morning. Will go home on home coumadin regimen. All follow up appointments for coumadin and transition of care have been scheduled. -- Thereasa Parkin PA-C

## 2013-08-11 NOTE — Progress Notes (Signed)
ANTICOAGULATION CONSULT NOTE - Follow up  Pharmacy Consult for Heparin Indication: hx DVT; s/p left heart cath   No Known Allergies  Patient Measurements: Height: 5\' 4"  (162.6 cm) Weight: 130 lb 4.7 oz (59.1 kg) IBW/kg (Calculated) : 59.2 Heparin Dosing Weight: 59 kg  Vital Signs: Temp: 98.2 F (36.8 C) (11/21 0831) Temp src: Oral (11/21 0831) BP: 156/113 mmHg (11/21 0831) Pulse Rate: 85 (11/21 0831)  Labs:  Recent Labs  08/09/13 1402 08/09/13 2000 08/09/13 2157 08/09/13 2350 08/10/13 0500 08/10/13 0855 08/11/13 0550  HGB 16.1  --   --   --  14.6  --  13.9  HCT 45.2  --   --   --  41.7  --  39.9  PLT 248  --   --   --  246  --  222  LABPROT 18.8*  --   --   --   --  15.9* 15.2  INR 1.62*  --   --   --   --  1.30 1.23  HEPARINUNFRC  --  <0.10*  --   --  0.23*  --  0.35  CREATININE 0.86  --   --   --  1.05  --  0.87  TROPONINI 1.83*  --  1.44* 1.59*  --   --   --     Estimated Creatinine Clearance: 74.5 ml/min (by C-G formula based on Cr of 0.87).   Medical History: Past Medical History  Diagnosis Date  . 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 Prosthetic valve endocarditis   . ETOH abuse     Has cut back significantly  . History of DVT (deep vein thrombosis)     Followed in Coumadin clinic  . S/P aortic valve replacement     Details not clear -  reportedly 1971 per patient  . Essential hypertension, benign   . History of GI bleed     February 2012   . CAD (coronary artery disease)     a. s/p PCI with 3.5 x 18  BMS to mid RCA, non obst LAD sten, Lcx patent 08/10/13  . History of echocardiogram     a. EF 55-60% b porcine AVR. not calcified, no AS, trivial AR    Medications:  Scheduled:  . aspirin EC  81 mg Oral Daily  . atorvastatin  80 mg Oral q1800  . clopidogrel  75 mg Oral Q breakfast  . iron polysaccharides  150 mg Oral Daily  . lisinopril  5 mg Oral Daily  .  metoprolol tartrate  25 mg Oral BID  . sodium chloride  3 mL Intravenous Q12H  . triamterene-hydrochlorothiazide  1 tablet Oral Daily    Assessment: 62 yo male admitted from cardiologist office with chest pain.  He has history of aortic valve replacement and hx DVT on chronic Coumadin. INR 1.23 this morning, on heparin bridge to coumadin. Heparin level 0.35 on 1100 units/hr. hgb 13.9, plt 222, No bleeding noted per chart.  PTA coumadin dose: 5 mg on TTSS, 2.5 mg on MWF,   Goal of Therapy:   Heparin level 0.3-0.7 units/ml Monitor platelets by anticoagulation protocol: Yes   Plan:  1. Contineu IV heparin at 1100 units/hr. 2. Coumadin 7.5mg  po x 1 3. F/U AM heparin level, CBC and INR

## 2013-08-11 NOTE — Progress Notes (Signed)
62yo male to resume Coumadin s/p cath.  INR yesterday was 1.3, low for h/o DVT (of note was 1.9 on 11/19).  Will confirm INR this am prior to resuming.  Currently on heparin bridge.  Per outpatient notes, need for continued long-term anticoag is currently unclear.  Vernard Gambles, PharmD, BCPS 08/11/2013 1:46 AM

## 2013-08-11 NOTE — Progress Notes (Signed)
CARDIAC REHAB PHASE I   PRE:  Rate/Rhythm: 77 SR  BP:  Supine: 104/68  Sitting:   Standing:    SaO2: 98 RA  MODE:  Ambulation: 1000 ft   POST:  Rate/Rhythm: 86 SR  BP:  Supine:   Sitting: 143/11 156/113  Standing:    SaO2:  5284-1324 Assisted X 1 and used his cane to ambulate. Gait steady with cane Pt able to walk 1000 feet without c/o of cp or SOB. BP up after walk , reported to RN. Completed MI and stent education with pt. He voices understanding. Pt declines Outpt. CRP due to no transportation.  Melina Copa RN 08/11/2013 9:19 AM

## 2013-08-11 NOTE — Progress Notes (Signed)
[name], [name] 08/11/2013

## 2013-08-11 NOTE — Telephone Encounter (Signed)
14 DAY TCM 

## 2013-08-11 NOTE — Progress Notes (Signed)
Pt has [employer].  [employer] $45.00/30 day supply - prior Berkley Harvey is required by calling [phone]. Appolonia Ackert J. Lucretia Roers, RN, BSN, [employer] [phone].

## 2013-08-12 ENCOUNTER — Encounter (HOSPITAL_COMMUNITY): Payer: Self-pay | Admitting: Physician Assistant

## 2013-08-12 DIAGNOSIS — F1010 Alcohol abuse, uncomplicated: Secondary | ICD-10-CM

## 2013-08-12 DIAGNOSIS — Z7901 Long term (current) use of anticoagulants: Secondary | ICD-10-CM

## 2013-08-12 DIAGNOSIS — Z86718 Personal history of other venous thrombosis and embolism: Secondary | ICD-10-CM

## 2013-08-12 DIAGNOSIS — I700 Atherosclerosis of aorta: Secondary | ICD-10-CM

## 2013-08-12 LAB — CBC
HCT: 41.2 % (ref 39.0–52.0)
Hemoglobin: 14.4 g/dL (ref 13.0–17.0)
MCH: 32 pg (ref 26.0–34.0)
MCHC: 35 g/dL (ref 30.0–36.0)
MCV: 91.6 fL (ref 78.0–100.0)
RBC: 4.5 MIL/uL (ref 4.22–5.81)
RDW: 12.9 % (ref 11.5–15.5)
WBC: 10.2 10*3/uL (ref 4.0–10.5)

## 2013-08-12 LAB — HEPARIN LEVEL (UNFRACTIONATED): Heparin Unfractionated: 0.53 IU/mL (ref 0.30–0.70)

## 2013-08-12 LAB — PROTIME-INR
INR: 1.15 (ref 0.00–1.49)
Prothrombin Time: 14.5 seconds (ref 11.6–15.2)

## 2013-08-12 MED ORDER — RIVAROXABAN 20 MG PO TABS: 20.0000 mg | ORAL_TABLET | Freq: Every day | ORAL | Status: DC

## 2013-08-12 MED ORDER — CLOPIDOGREL BISULFATE 75 MG PO TABS: 75.0000 mg | ORAL_TABLET | Freq: Every day | ORAL | Status: DC

## 2013-08-12 MED ORDER — ATORVASTATIN CALCIUM 80 MG PO TABS: 80.0000 mg | ORAL_TABLET | Freq: Every day | ORAL | Status: DC

## 2013-08-12 MED ORDER — METOPROLOL TARTRATE 25 MG PO TABS: 25.0000 mg | ORAL_TABLET | Freq: Two times a day (BID) | ORAL | Status: DC

## 2013-08-12 MED ORDER — WARFARIN SODIUM 5 MG PO TABS
5.0000 mg | ORAL_TABLET | Freq: Once | ORAL | Status: DC
  Filled 2013-08-12: qty 1

## 2013-08-12 MED ORDER — NITROGLYCERIN 0.4 MG SL SUBL: 0.4000 mg | SUBLINGUAL_TABLET | SUBLINGUAL | Status: DC | PRN

## 2013-08-12 MED ORDER — RIVAROXABAN 20 MG PO TABS
20.0000 mg | ORAL_TABLET | Freq: Every day | ORAL | Status: DC
  Administered 2013-08-12: 12:00:00 20 mg via ORAL
  Filled 2013-08-12: qty 1

## 2013-08-12 NOTE — Progress Notes (Addendum)
Patient Name: [name] Date of Encounter: 08/12/2013     Active Problems:   NSTEMI (non-ST elevated myocardial infarction)    SUBJECTIVE: Feels well this AM. No chest pain or shortness of breath. Ambulated with cardiac rehab this AM w/o incident. Sister able to pick him up today.    OBJECTIVE  Filed Vitals:   08/12/13 0021 08/12/13 0600 08/12/13 0604 08/12/13 0758  BP: 142/99 92/61 103/77 120/92  Pulse: 71  59 74  Temp: 98.3 F (36.8 C)  97.4 F (36.3 C) 98.4 F (36.9 C)  TempSrc: Oral  Oral Oral  Resp: 18  20 18   Height:      Weight: 59.5 kg (131 lb 2.8 oz)     SpO2: 93%  97% 97%    Intake/Output Summary (Last 24 hours) at 08/12/13 1017 Last data filed at 08/12/13 0900  Gross per 24 hour  Intake    600 ml  Output   1425 ml  Net   -825 ml   Weight change: 0.4 kg (14.1 oz)  PHYSICAL EXAM  General: Well developed, well nourished, in no acute distress. Head: Normocephalic, atraumatic, sclera non-icteric, no xanthomas, nares are without discharge.  Neck: Supple without bruits or JVD. Lungs:  Resp regular and unlabored, CTA. Heart: RRR, no s3, s4, or murmurs. Abdomen: Soft, non-tender, non-distended, BS + x 4.  Msk:  Strength and tone appears normal for age. Extremities: R radial site w/o induration or tenderness to palpation. No clubbing, cyanosis or edema. DP/PT/Radials 2+ and equal bilaterally. Neuro: Full sensation in R hand. Alert and oriented X 3. Moves all extremities spontaneously. Psych: Normal affect.  LABS:  Recent Labs     08/11/13  0550  08/12/13  0720  WBC  10.5  10.2  HGB  13.9  14.4  HCT  39.9  41.2  MCV  90.9  91.6  PLT  222  221   Recent Labs Lab 08/09/13 1402 08/10/13 0500 08/10/13 0930 08/11/13 0550  NA 138 133*  --  136  K 4.2 4.2  --  3.9  CL 99 98  --  103  CO2 24 22  --  22  BUN 11 16  --  14  CREATININE 0.86 1.05  --  0.87  CALCIUM 9.1 9.0  --  8.6  PROT 8.1  --  7.3  --   BILITOT 2.2*  --  2.4*  --     ALKPHOS 76  --  69  --   ALT 10  --  9  --   AST 33  --  33  --   GLUCOSE 89 96  --  85   Recent Labs     08/09/13  1402  08/09/13  2157  08/09/13  2350  TROPONINI  1.83*  1.44*  1.59*   Recent Labs     08/10/13  0500  CHOL  210*  HDL  58  LDLCALC  134*  TRIG  92  CHOLHDL  3.6    Recent Labs  08/09/13 2157  TSH 3.034   TELE: NSR  Radiology/Studies:  Dg Chest Port 1 View  08/09/2013   CLINICAL DATA:  Chest pain.  EXAM: PORTABLE CHEST - 1 VIEW  COMPARISON:  02/05/2011  FINDINGS: The cardiac silhouette is enlarged. Aorta is tortuous and ectatic. Patient is status post median sternotomy. EKG leads are appreciated about the chest. There are no focal regions of consolidation or focal infiltrates. There is no evidence of pleural effusions.  The osseous structures are grossly unremarkable.  IMPRESSION: No active disease.   Electronically Signed   By: Salome Holmes M.D.   On: 08/09/2013 16:25    Current Medications:  . aspirin EC  81 mg Oral Daily  . atorvastatin  80 mg Oral q1800  . clopidogrel  75 mg Oral Q breakfast  . iron polysaccharides  150 mg Oral Daily  . lisinopril  5 mg Oral Daily  . metoprolol tartrate  25 mg Oral BID  . triamterene-hydrochlorothiazide  1 tablet Oral Daily  . 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   1. NSTEMI/CAD- s/p BMS-RCA on 08/10/13. Coumadin held. INR 1.15 today. No chest pain or shortness of breath. Ambulated well with cardiac rehab. R radial site healing well w/o evidence of complication. -- Continue Coumadin, ASA 81 and Plavix x 1  month, then stop Plavix. Resume Coumadin and ASA thereafter.  -- Resume ACEi, BB, statin (LFTs normal despite prior heavy EtOH use), NTG SL PRN -- Lovenox bridging to Coumadin. Will consult pharmacy regarding d/c recs. Case management assisting with Lovenox, pharmacy availability and affordability.   2. S/p bioprosthetic AVR- prior prosthetic valve endocarditis treated with antibiotics alone. Echo 11/20 reveals EF 55-60%, mild LVH, normal AV function.   3. H/o arterial and venous thrombosis- h/o DVT. Long term anticoagulation recommended by hematology in 2012-- on chronic Coumadin anticoagulation. INR subtherapeutic and Coumadin held pre-cath. Coumadin since restarted, managed by pharmacy. INR 1.15 today, down from yesterday.  -- Lovenox bridging to Coumadin on discharge -- Will consult with pharmacy regarding discharge dose and next INR  4. Hypertension- non-compliant with antihypertensives. Better controlled on ACEi, BB.  -- Continue current antihypertensives    Dispo- will plan to discharge home today pending the above arrangements with case management. Will organize Lovenox and Coumadin regimen prior to discharge. He will need close follow-up in the Coumadin clinic and the office. Will plan for next week- TCM 7 day.     Signed, R. Hurman Horn, PA-C 08/12/2013, 10:17 AM Patient seen and examined. I agree with the assessment and plan as detailed above. See also my additional thoughts below.   The patient is stable and ready to go home today pending the arrangements are being made.  Willa Rough, MD, [facility] 08/12/2013 10:35 AM   After discussion with case management, Lovenox will cost the patient $60 per injection. Xarelto assistance has been arranged previously. Will switch from Coumadin to Xarelto. After discussing with pharmacy, the patient will start on Xarelto 20mg  PO daily for chronic anticoagulation-- prior DVT. No indication to start 15mg  BID dosing. Since INR was subtherapeutic  this admission, no need to watch INR trend down prior to starting Xarelto. He will receive one dose prior to discharge after heparin is stopped. Prescription arrangements made to obtain Xarelto assistance through his pharmacy. Patient notified and updated. Will cancel outpatient Coumadin clinic appointment.   Jacqulyn Bath, PA-C 08/12/2013 11:53 AM

## 2013-08-12 NOTE — Progress Notes (Signed)
Heparin and coumadin per Rx   Anticoagulation: history of aortic valve replacement and hx DVT on chronic Coumadin (5mg  daily except 2.5mg  on MWF), long-term AC per Hematology recommendation (2012). INR 1.15 this morning (from 1.23), on heparin bridge to coumadin. Heparin level 0.53 on 1100 units/hr. hgb 14.4, plt 221 K, No bleeding noted per chart. history of both venous and arterial thrombosis.  Plan to d/c home today with coumadin and lovenox bridge.  Goal: 6hr heparin level 0.3-0.7; INR 2-3  Plan: 1. Contineu IV heparin at 1100 units/hr for now 2. Coumadin 5mg  po x 1 3. If going to be discharged on lovenox, rec to give him 60mg  sq q12h.  The 1st dose of lovenox should be given to him 1 hr after the heparin drip is discontinued.

## 2013-08-12 NOTE — Progress Notes (Signed)
Patient was taught injection technique in anticipation of discharge with lovenox.  Patient demonstrated competency with a 0.2 cc saline injection to right upper abdomen.  States he is comfortable self injecting, and that he had previously used lovenox post surgery in 2005.

## 2013-08-12 NOTE — Progress Notes (Signed)
CARDIAC REHAB PHASE I   PRE:  Rate/Rhythm: 82 SR    BP: sitting 120/92    SaO2:   MODE:  Ambulation: 500 ft   POST:  Rate/Rhythm: 76-85 SR    BP: sitting 140/77     SaO2:    Tolerated well, no c/o. HR fluctuating, appears in NSR. To recliner, no questions, states he has been reading materials. [id number]  Elissa Lovett [place] CES, ACSM 08/12/2013 8:11 AM

## 2013-08-12 NOTE — Discharge Summary (Signed)
Discharge Summary   Patient ID: [name],  MRN: [id number], DOB/AGE: 01-02-1951 62 y.o.  Admit date: 08/09/2013 Discharge date: 08/12/2013  Primary Physician: Syliva Overman, MD Primary Cardiologist: previously R. Dietrich Pates, MD; now Ival Bible, MD  Discharge Diagnoses Principal Problem:   NSTEMI (non-ST elevated myocardial infarction) Active Problems:   Hypertension   Mycotic aneurysm   S/P aortic valve replacement   History of GI bleed   History of DVT (deep vein thrombosis)   Chronic anticoagulation   History of ETOH abuse   Aortic atherosclerosis  Allergies No Known Allergies  Diagnostic Studies/Procedures  PORTABLE CHEST X-RAY - 08/09/13  IMPRESSION:  No active disease.  2D ECHOCARDIOGRAM - 08/10/13  - Left ventricle: The cavity size was normal. Wall thickness was increased in a pattern of mild LVH. Systolic function was normal. The estimated ejection fraction was in the range of 55% to 60%.  - Aortic valve: Apparently has had a porcine AVR. Non coronary cusp calcified. No AS and trivial AR No perivalvular leak  - Atrial septum: No defect or patent foramen ovale was identified.  CARDIAC CATHETERIZATION + PERCUTANEOUS CORONARY INTERVENTION - 08/10/13  Hemodynamics:  AO 84/51 with a mean of 67  LV 83/9  Coronary angiography:  Coronary dominance: right  Left mainstem: The left main is patent. It arises from the left cusp and there is no obstructive disease.  Left anterior descending (LAD): The LAD is patent to the distal anterior wall. There is diffuse irregularity with mild 30-40% stenosis in the mid vessel. The first diagonal is a large caliber vessel with no significant disease. The ostium has 20-30% stenosis.  Left circumflex (LCx): There is a large intermediate branch with no significant stenosis. The AV circumflex is very small with no major branch vessels.  Right coronary artery (RCA): The RCA has a anterior origin with the unusual take off of the  vessel. The vessel is tortuous. There is a shepherd's crook at the proximal part of the vessel. There is mild 30-40% stenosis through that region. Further down in the mid vessel there is 95% stenosis with hypodensity suggestive of intraluminal thrombus. The distal vessel has nonobstructive 40% stenosis. The PDA and PLA branches are patent.  Left ventriculography: Left ventricular systolic function is mildly reduced. There is hypokinesis of the inferior wall. The estimated left ventricular ejection fraction is 45%.   PCI Data:  Vessel - RCA/Segment - mid  Percent Stenosis (pre) 95  TIMI-flow 3  Stent 3.5 x 18 mm vision bare-metal  Percent Stenosis (post) 0  TIMI-flow (post) 3   Final Conclusions:  1. Mild segmental left ventricular systolic dysfunction  2. Severe single-vessel coronary artery disease involving the mid right coronary artery  3. Nonobstructive LAD stenosis  4. Patency of the left circumflex  5. Severe calcification of the aortic root   History of Present Illness [name] is a 62 y.o. male who was admitted to [facility] from the [place] office with the above problem list.   He has a history of bioprosthetic AVR in 2005 for unclear reasons. He had endocarditis in 2012 diagnosed by embolic phenomena to his spleen and kidneys. He was treated empirically with antibiotics. He has a history of DVT. He was on chronic Coumadin anticoagulation. 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 HE has a history of arterial thrombosis s/p thrombo-/ embolectomy. He also has hypertension and h/o EtOH abuse.   He presented to the Coumadin clinic in  [place] on 11/19 (INR 1.9). He reported progressively worsening chest pressure over the prior two weeks. An EKG, when compared to prior tracings, revealed more prominent ST depressions. He was given aspirin and metoprolol in the office, and the decision was made to transfer to [facility] via EMS for  cardiac catheterization.   Hospital Course   He arrived in stable condition. CXR showed no active process. Troponin on arrival was elevated at 1.83. Two subsequent readings revealed a downtrend. He was heparinized. He remained stable overnight. He underwent 2D echo the following day reveaIing EF 55-60%, mild LVH, normal AV function. INR returned at 1.62 and 1.30 the following day. The decision was made to proceed with cardiac catheterization. As above, this revealed 30-40% mid LAD, 20-30% ostial D1, proximal 30-40% RCA, 95% mid RCA thrombus, 40% distal stenosis. EF 45%, inferior HK. He underwent successful BMS-RCA. The recommendation was made to continue Coumadin, ASA and Plavix x 1 month, then withdraw Plavix and resume Coumadin and ASA alone. He tolerated the procedure well w/o complications. He was stable for discharge yesterday but did not have transportation. Coumadin was restarted yesterday, however INR remained subtherapeutic at 1.15 this morning. Lovenox bridging to Coumadin was planned, however, this was not feasible due to cost ($60 per Lovenox injection). Case management had previously arranged Xarelto assistance. He will be discharged with Xarelto. Pharmacy was consulted who recommended starting at 20mg  daily after the patient's heparin had been turned off. He received a dose today prior to discharge. There is no indication for starting Xarelto at 15mg  BID per pharmacy. Coumadin will be discontinued. He will follow-up in the [place] office in 1-2 weeks as part of transitional care management. He will be discharged on the medication regimen outlined below. Pertinent labwork is noted below. This information, including post-cath instructions and activity restrictions, has been clearly outlined in the discharge AVS.   Discharge Vitals:  Blood pressure 120/92, pulse 74, temperature 98.4 F (36.9 C), temperature source Oral, resp. rate 18, height 5\' 4"  (1.626 m), weight 59.5 kg (131 lb 2.8 oz), SpO2  97.00%.   Labs: Recent Labs     08/11/13  0550  08/12/13  0720  WBC  10.5  10.2  HGB  13.9  14.4  HCT  39.9  41.2  MCV  90.9  91.6  PLT  222  221    Recent Labs Lab 08/09/13 1402 08/10/13 0500 08/10/13 0930 08/11/13 0550  NA 138 133*  --  136  K 4.2 4.2  --  3.9  CL 99 98  --  103  CO2 24 22  --  22  BUN 11 16  --  14  CREATININE 0.86 1.05  --  0.87  CALCIUM 9.1 9.0  --  8.6  PROT 8.1  --  7.3  --   BILITOT 2.2*  --  2.4*  --   ALKPHOS 76  --  69  --   ALT 10  --  9  --   AST 33  --  33  --   GLUCOSE 89 96  --  85   Recent Labs     08/09/13  1402  08/09/13  2157  08/09/13  2350  TROPONINI  1.83*  1.44*  1.59*   Recent Labs     08/10/13  0500  CHOL  210*  HDL  58  LDLCALC  134*  TRIG  92  CHOLHDL  3.6    Recent Labs  08/09/13 2157  TSH 3.034  Disposition:  Discharge Orders   Future Appointments Provider Department Dept Phone   08/16/2013 9:20 AM [place] [facility] [phone]   08/25/2013 1:10 PM Jodelle Gross, NP [facility] [phone]   09/25/2013 8:00 AM Kerri Perches, MD [facility] [phone]   Future Orders Complete By Expires   Diet - low sodium heart healthy  As directed    Increase activity slowly  As directed          Follow-up Information   Follow up with Joni Reining, NP On 08/25/2013. (1:10pm)    Specialty:  Nurse Practitioner   Contact information:   [address] [place] [address] [phone]       Follow up with Syliva Overman, MD. Schedule an appointment as soon as possible for a visit in 1 week. (For general post-hospital follow-up. )    Specialty:  Family Medicine   Contact information:   [address], [address] [place] [address] [phone]       Discharge Medications:    Medication List    STOP taking these medications       warfarin 5 MG tablet  Commonly known as:  COUMADIN      TAKE these medications        aspirin 81 MG tablet  Take 81 mg by mouth daily.     atorvastatin 80 MG tablet  Commonly known as:  LIPITOR  Take 1 tablet (80 mg total) by mouth daily at 6 PM.     clopidogrel 75 MG tablet  Commonly known as:  PLAVIX  Take 1 tablet (75 mg total) by mouth daily with breakfast. Stop after 1 month (09/09/2013).     Iron 325 (65 FE) MG Tabs  Take 1 tablet by mouth daily.     lisinopril 5 MG tablet  Commonly known as:  PRINIVIL,ZESTRIL  Take 1 tablet (5 mg total) by mouth daily.     metoprolol tartrate 25 MG tablet  Commonly known as:  LOPRESSOR  Take 1 tablet (25 mg total) by mouth 2 (two) times daily.     nitroGLYCERIN 0.4 MG SL tablet  Commonly known as:  NITROSTAT  Place 1 tablet (0.4 mg total) under the tongue every 5 (five) minutes as needed for chest pain.     Rivaroxaban 20 MG Tabs tablet  Commonly known as:  XARELTO  Take 1 tablet (20 mg total) by mouth daily.     triamterene-hydrochlorothiazide 75-50 MG per tablet  Commonly known as:  MAXZIDE  Take 1 tablet by mouth daily.       Outstanding Labs/Studies: LFTs, lipid panel in 6 weeks since starting statin  Duration of Discharge Encounter: Greater than 30 minutes including physician time.  Signed, R. Hurman Horn, PA-C 08/12/2013, 1:39 PM Patient seen and examined. I agree with the assessment and plan as detailed above. See also my additional thoughts below.   The patient was ready to go home today. I agree with the complete discharge summary  Willa Rough, MD, [facility] 08/13/2013 7:39 AM

## 2013-08-16 ENCOUNTER — Encounter: Payer: Medicare Other | Admitting: *Deleted

## 2013-08-16 ENCOUNTER — Telehealth: Payer: Self-pay | Admitting: *Deleted

## 2013-08-16 NOTE — Telephone Encounter (Signed)
Patient contacted regarding discharge from [place] on 08/13/13.  Patient understands to follow up with provider Joni Reining, NP on Dec 5 , 2014 at 1:10pm at [place] office. Patient understands discharge instructions? yes Patient understands medications and regiment? yes Patient understands to bring all medications to this visit? yes

## 2013-08-25 ENCOUNTER — Encounter: Payer: Medicare Other | Admitting: Adult Health

## 2013-08-25 ENCOUNTER — Ambulatory Visit (INDEPENDENT_AMBULATORY_CARE_PROVIDER_SITE_OTHER): Payer: Medicare Other | Admitting: Cardiology

## 2013-08-25 ENCOUNTER — Encounter: Payer: Self-pay | Admitting: Cardiology

## 2013-08-25 VITALS — BP 178/71 | HR 57 | Ht 64.0 in | Wt 136.0 lb

## 2013-08-25 DIAGNOSIS — Z86718 Personal history of other venous thrombosis and embolism: Secondary | ICD-10-CM

## 2013-08-25 DIAGNOSIS — I2510 Atherosclerotic heart disease of native coronary artery without angina pectoris: Secondary | ICD-10-CM

## 2013-08-25 DIAGNOSIS — E785 Hyperlipidemia, unspecified: Secondary | ICD-10-CM | POA: Insufficient documentation

## 2013-08-25 DIAGNOSIS — Z953 Presence of xenogenic heart valve: Secondary | ICD-10-CM

## 2013-08-25 DIAGNOSIS — E782 Mixed hyperlipidemia: Secondary | ICD-10-CM

## 2013-08-25 DIAGNOSIS — Z952 Presence of prosthetic heart valve: Secondary | ICD-10-CM

## 2013-08-25 NOTE — Assessment & Plan Note (Signed)
On high-dose Lipitor at this point. Followup FLP and LFT for next visit.

## 2013-08-25 NOTE — Assessment & Plan Note (Signed)
Aortic bioprosthetic valve stable with calcification of the noncoronary cusp, trivial aortic regurgitation, no perivalvular leak. 

## 2013-08-25 NOTE — Assessment & Plan Note (Signed)
Patient has been switched from Coumadin to Xarelto during his recent hospitalization. My understanding is that indication for long-term anticoagulation is more related to prior history of arterial embolus.

## 2013-08-25 NOTE — Patient Instructions (Addendum)
Your physician recommends that you schedule a follow-up appointment in: 3 months  Your physician has recommended you make the following change in your medication:   1) FINISH YOUR CURRENT PRESCRIPTION OF PLAVIX THEN STOP 2) CONTINUE TO TAKE YOUR ASPIRIN 81MG  ONCE DAILY 3) CONTINUE TO TAKE YOUR XARELTO 20MG  ONCE DAILY  Your physician recommends that you return for lab work in: 3 MONTHS  YOUR LAB WORK IS DUE IN [facility]   Please go to [facility], located across the street from [facility]. The office is located on the second floor with hours of operations as follows:   Monday - Friday 7am until 6pm, Saturday 8am until 12 noon   __X_DO NOT EAT OR DRINK AFTER MIDNIGHT THE EVENING PRIOR TO LABWORK   ___ YOUR LABWORK IS NOT FASTING--YOU MAY EAT PRIOR TO LABWORK

## 2013-08-25 NOTE — Progress Notes (Signed)
Clinical Summary [name] is a medically complex 62 y.o.male that I met in clinic recently in November, directly admitted to the hospital with accelerating angina symptoms. Refer to prior note for details.  Discharge summary reviewed, patient ruled in for NSTEMI and underwent cardiac catheterization revealing severe single-vessel disease involving the RCA, now status post BMS placement, otherwise nonobstructive disease within the LAD and circumflex. Aortic root was described as being severely calcified. Echocardiogram reported mild LVH with LVEF 55-60%, bioprosthetic AVR with calcification of the noncoronary cusp, no stenosis, trivial aortic regurgitation, no perivalvular leak. Patient was changed from Coumadin to Xarelto, planned to continue on aspirin and Plavix for only one month after intervention.  Recent lab work reviewed finding hemoglobin 14.4, platelets 221, potassium 3.9, BUN 14, creatinine 0.8.  He states he feels much better today. Indicates compliance with his medications, no bleeding problems at this point. We reviewed his medications and I reinforced the plan for him to stop Plavix after he completes a one-month course, stay on aspirin and Xarelto at that point forward. Coumadin clinic has been made aware of his switch from Coumadin to Xarelto.   No Known Allergies  Current Outpatient Prescriptions  Medication Sig Dispense Refill  . aspirin 81 MG tablet Take 81 mg by mouth daily.      [name] atorvastatin (LIPITOR) 80 MG tablet Take 1 tablet (80 mg total) by mouth daily at 6 PM.  30 tablet  3  . clopidogrel (PLAVIX) 75 MG tablet Take 1 tablet (75 mg total) by mouth daily with breakfast. Stop after 1 month (09/09/2013).  30 tablet  0  . Ferrous Sulfate (IRON) 325 (65 FE) MG TABS Take 1 tablet by mouth daily.        [name] lisinopril (PRINIVIL,ZESTRIL) 5 MG tablet Take 1 tablet (5 mg total) by mouth daily.  30 tablet  3  . metoprolol tartrate (LOPRESSOR) 25 MG tablet Take 1 tablet (25 mg  total) by mouth 2 (two) times daily.  60 tablet  3  . nitroGLYCERIN (NITROSTAT) 0.4 MG SL tablet Place 1 tablet (0.4 mg total) under the tongue every 5 (five) minutes as needed for chest pain.  25 tablet  3  . Rivaroxaban (XARELTO) 20 MG TABS tablet Take 1 tablet (20 mg total) by mouth daily.  30 tablet  3  . triamterene-hydrochlorothiazide (MAXZIDE) 75-50 MG per tablet Take 1 tablet by mouth daily.  30 tablet  11   No current facility-administered medications for this visit.    Past Medical History  Diagnosis Date  . 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 Prosthetic valve endocarditis   . ETOH abuse     Has cut back significantly  . History of DVT (deep vein thrombosis) 2011    Followed in Coumadin clinic  . S/P aortic valve replacement     Details not clear -  reportedly 1971 per patient  . Essential hypertension, benign   . History of GI bleed     February 2012   . CAD (coronary artery disease)     a. s/p PCI with 3.5 x 18 BMS to mid RCA, non obst LAD, Lcx patent 08/10/13  . History of echocardiogram     a. EF 55-60% b porcine AVR. not calcified, no AS, trivial AR b. EF Echo 08/10/13: 55-60%, mild LVH, normal AV function  . Embolus of femoral artery 2012    s/p surgical  repair, reason for lifelong coumadin    Past Surgical History  Procedure Laterality Date  . Aoric valve replacement  January 2005    29mm Medtronic Freestyle stentless porcine bioprosthesis [facility])  . Left leg embolectomy  May 2012    Dr. Myra Gianotti - reason for lifelong coumadin  . Cardiac catheterization      30-40% mid LAD, 20-30% ostial D1, proximal 30-40% RCA, 95% mid RCA thrombus, 40% distal stenosis. EF 45%, inferior HK.    Social History [name] reports that he has quit smoking. His smoking use included Cigarettes. He smoked 0.50 packs per day. He has never used smokeless tobacco. [name] reports that he drinks about  31.2 ounces of alcohol per week.  Review of Systems Negative except as outlined above.  Physical Examination Filed Vitals:   08/25/13 1300  BP: 178/71  Pulse: 57   Filed Weights   08/25/13 1300  Weight: 136 lb (61.689 kg)    Patient appears comfortable at rest.  HEENT: Conjunctiva and lids normal, disconjugate gaze, oropharynx clear with poor dentition.  Neck: Supple, elevated JVP or carotid bruits, no thyromegaly.  Lungs: Clear to auscultation, nonlabored breathing at rest.  Cardiac: Regular rate and rhythm, no S3, 1/6 diastolic murmur heard at the right base and apex, no pericardial rub.  Abdomen: Soft, nontender, bowel sounds present, no guarding or rebound.  Extremities: No pitting edema, distal pulses 2+.  Skin: Warm and dry.  Musculoskeletal: No kyphosis.  Neuropsychiatric: Alert and oriented x3, affect grossly appropriate.   Problem List and Plan   Coronary atherosclerosis of native coronary artery Patient now status post an STM I with placement of BMS to the RCA in November, otherwise nonobstructive disease. He will continue a full month course of aspirin, Xarelto, and Plavix following BMS, then simplify regimen to only aspirin and Xarelto. Scheduled appointment in 3 months.  S/P aortic valve replacement Aortic bioprosthetic valve stable with calcification of the noncoronary cusp, trivial aortic regurgitation, no perivalvular leak.  Mixed hyperlipidemia On high-dose Lipitor at this point. Followup FLP and LFT for next visit.  History of DVT (deep vein thrombosis) Patient has been switched from Coumadin to Xarelto during his recent hospitalization. My understanding is that indication for long-term anticoagulation is more related to prior history of arterial embolus.    Jonelle Sidle, M.D., F.A.C.C.

## 2013-08-25 NOTE — Assessment & Plan Note (Signed)
Patient now status post an STM I with placement of BMS to the RCA in November, otherwise nonobstructive disease. He will continue a full month course of aspirin, Xarelto, and Plavix following BMS, then simplify regimen to only aspirin and Xarelto. Scheduled appointment in 3 months.

## 2013-08-25 NOTE — Progress Notes (Signed)
HPI: Mr. Counsell is a 62 year old former patient of Dr. Dietrich Pates were following for ongoing assessment and management of CAD, status post admission to [place] in the setting of non-ST elevation MI. Patient underwent cardiac catheterization on 08/10/2013 revealing near single-vessel CAD involving the right mid coronary artery, nonobstructive LAD stenosis, patency of the left circumflex, and severe calcification of the aortic root. Other history includes DVT on Coumadin therapy and hypertension. The patient had PCI with bare metal stents the right coronary artery. He was continued Coumadin aspirin and Plavix x1 month, to withdraw Plavix and continue Coumadin and aspirin alone. However, on discharge the patient was changed to Xarelto, 20 mg daily. He is here for hospital followup.  No Known Allergies  Current Outpatient Prescriptions  Medication Sig Dispense Refill  . aspirin 81 MG tablet Take 81 mg by mouth daily.      [name] atorvastatin (LIPITOR) 80 MG tablet Take 1 tablet (80 mg total) by mouth daily at 6 PM.  30 tablet  3  . clopidogrel (PLAVIX) 75 MG tablet Take 1 tablet (75 mg total) by mouth daily with breakfast. Stop after 1 month (09/09/2013).  30 tablet  0  . Ferrous Sulfate (IRON) 325 (65 FE) MG TABS Take 1 tablet by mouth daily.        [name] lisinopril (PRINIVIL,ZESTRIL) 5 MG tablet Take 1 tablet (5 mg total) by mouth daily.  30 tablet  3  . metoprolol tartrate (LOPRESSOR) 25 MG tablet Take 1 tablet (25 mg total) by mouth 2 (two) times daily.  60 tablet  3  . nitroGLYCERIN (NITROSTAT) 0.4 MG SL tablet Place 1 tablet (0.4 mg total) under the tongue every 5 (five) minutes as needed for chest pain.  25 tablet  3  . Rivaroxaban (XARELTO) 20 MG TABS tablet Take 1 tablet (20 mg total) by mouth daily.  30 tablet  3  . triamterene-hydrochlorothiazide (MAXZIDE) 75-50 MG per tablet Take 1 tablet by mouth daily.  30 tablet  11   No current facility-administered medications for this visit.    Past  Medical History  Diagnosis Date  . 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 Prosthetic valve endocarditis   . ETOH abuse     Has cut back significantly  . History of DVT (deep vein thrombosis) 2011    Followed in Coumadin clinic  . S/P aortic valve replacement     Details not clear -  reportedly 1971 per patient  . Essential hypertension, benign   . History of GI bleed     February 2012   . CAD (coronary artery disease)     a. s/p PCI with 3.5 x 18  BMS to mid RCA, non obst LAD sten, Lcx patent 08/10/13  . History of echocardiogram     a. EF 55-60% b porcine AVR. not calcified, no AS, trivial AR b. EF Echo 08/10/13: 55-60%, mild LVH, normal AV function  . Embolus of femoral artery 2012    s/p surgical repair, reason for lifelong coumadin    Past Surgical History  Procedure Laterality Date  . Aoric valve replacement  January 2005    29mm Medtronic Freestyle stentless porcine bioprosthesis [facility])  . Left leg embolectomy  May 2012    Dr. Myra Gianotti - reason for lifelong coumadin  . Cardiac catheterization      30-40% mid LAD, 20-30% ostial D1, proximal 30-40% RCA, 95% mid RCA  thrombus, 40% distal stenosis. EF 45%, inferior HK.    ROS: PHYSICAL EXAM There were no vitals taken for this visit.  EKG:  ASSESSMENT AND PLAN

## 2013-08-28 NOTE — Telephone Encounter (Signed)
Opened in Error.

## 2013-09-25 ENCOUNTER — Ambulatory Visit: Payer: Self-pay | Admitting: Family Medicine

## 2013-09-27 ENCOUNTER — Ambulatory Visit (INDEPENDENT_AMBULATORY_CARE_PROVIDER_SITE_OTHER): Payer: Medicare Other | Admitting: *Deleted

## 2013-09-27 DIAGNOSIS — I82409 Acute embolism and thrombosis of unspecified deep veins of unspecified lower extremity: Secondary | ICD-10-CM

## 2013-09-27 DIAGNOSIS — Z7901 Long term (current) use of anticoagulants: Secondary | ICD-10-CM

## 2013-09-27 DIAGNOSIS — Z954 Presence of other heart-valve replacement: Secondary | ICD-10-CM

## 2013-09-27 LAB — BASIC METABOLIC PANEL
BUN: 14 mg/dL (ref 6–23)
CALCIUM: 9.3 mg/dL (ref 8.4–10.5)
CO2: 28 mEq/L (ref 19–32)
Chloride: 97 mEq/L (ref 96–112)
Creat: 0.94 mg/dL (ref 0.50–1.35)
GLUCOSE: 105 mg/dL — AB (ref 70–99)
Potassium: 4.3 mEq/L (ref 3.5–5.3)
Sodium: 134 mEq/L — ABNORMAL LOW (ref 135–145)

## 2013-09-27 LAB — CBC
HEMATOCRIT: 42 % (ref 39.0–52.0)
Hemoglobin: 14.2 g/dL (ref 13.0–17.0)
MCH: 30.5 pg (ref 26.0–34.0)
MCHC: 33.8 g/dL (ref 30.0–36.0)
MCV: 90.3 fL (ref 78.0–100.0)
Platelets: 313 10*3/uL (ref 150–400)
RBC: 4.65 MIL/uL (ref 4.22–5.81)
RDW: 12.7 % (ref 11.5–15.5)
WBC: 10.3 10*3/uL (ref 4.0–10.5)

## 2013-09-27 NOTE — Patient Instructions (Addendum)
Pt here for 1 month Xarelto follow up.    Pt was started on Xarelto for DVT on 08/12/13.  Reviewed patients medication list.  Pt is not currently on any combined P-gp and strong CYP3A4 inhibitors/inducers (ketoconazole, traconazole, ritonavir, carbamazepine, phenytoin, rifampin, St. John's wort).  Reviewed labs.  SCr 0.94, Weight 139, CrCl 73.59.  Dose appropriate based on CrCl.  Continue Xarelto 20mg  daily.  Hgb and HCT 14.2/42.0  A full discussion of the nature of anticoagulants has been carried out.  A benefit/risk analysis has been presented to the patient, so that they understand the justification for choosing anticoagulation with Xarelto at this time.  The need for compliance is stressed.  Pt is aware to take the medication once daily with the largest meal of the day.  Side effects of potential bleeding are discussed, including unusual colored urine or stools, coughing up blood or coffee ground emesis, nose bleeds or serious fall or head trauma.  Discussed signs and symptoms of stroke. The patient should avoid any OTC items containing aspirin or ibuprofen.  Avoid alcohol consumption.   Call if any signs of abnormal bleeding.  Discussed financial obligations and resolved any difficulty in obtaining medication.  Next lab test test in 6 months.

## 2013-10-18 ENCOUNTER — Ambulatory Visit (INDEPENDENT_AMBULATORY_CARE_PROVIDER_SITE_OTHER): Payer: Medicare Other | Admitting: Family Medicine

## 2013-10-18 ENCOUNTER — Encounter: Payer: Self-pay | Admitting: Family Medicine

## 2013-10-18 ENCOUNTER — Encounter (INDEPENDENT_AMBULATORY_CARE_PROVIDER_SITE_OTHER): Payer: Self-pay

## 2013-10-18 VITALS — BP 124/82 | HR 56 | Resp 16 | Ht 64.0 in | Wt 133.1 lb

## 2013-10-18 DIAGNOSIS — Z125 Encounter for screening for malignant neoplasm of prostate: Secondary | ICD-10-CM

## 2013-10-18 DIAGNOSIS — T148XXA Other injury of unspecified body region, initial encounter: Secondary | ICD-10-CM

## 2013-10-18 DIAGNOSIS — I10 Essential (primary) hypertension: Secondary | ICD-10-CM

## 2013-10-18 DIAGNOSIS — Z87828 Personal history of other (healed) physical injury and trauma: Secondary | ICD-10-CM

## 2013-10-18 DIAGNOSIS — I2510 Atherosclerotic heart disease of native coronary artery without angina pectoris: Secondary | ICD-10-CM

## 2013-10-18 DIAGNOSIS — R059 Cough, unspecified: Secondary | ICD-10-CM

## 2013-10-18 DIAGNOSIS — IMO0002 Reserved for concepts with insufficient information to code with codable children: Secondary | ICD-10-CM

## 2013-10-18 DIAGNOSIS — R05 Cough: Secondary | ICD-10-CM

## 2013-10-18 DIAGNOSIS — E782 Mixed hyperlipidemia: Secondary | ICD-10-CM

## 2013-10-18 DIAGNOSIS — Z23 Encounter for immunization: Secondary | ICD-10-CM

## 2013-10-18 MED ORDER — BENZONATATE 100 MG PO CAPS: 100.0000 mg | ORAL_CAPSULE | Freq: Two times a day (BID) | ORAL | Status: DC | PRN

## 2013-10-18 NOTE — Patient Instructions (Signed)
Welcome to medicare in June, call if you need me before  Flu vaccine and TDAp today  Fasting lipid, cmp,  and PSA in June before visit if possible please  STOP PLAVIX  Medication sent in for chest congestion

## 2013-10-22 DIAGNOSIS — Z87828 Personal history of other (healed) physical injury and trauma: Secondary | ICD-10-CM | POA: Insufficient documentation

## 2013-10-22 DIAGNOSIS — IMO0002 Reserved for concepts with insufficient information to code with codable children: Secondary | ICD-10-CM | POA: Insufficient documentation

## 2013-10-22 NOTE — Assessment & Plan Note (Signed)
Recent h/o laceration , needs TdAP

## 2013-10-22 NOTE — Progress Notes (Signed)
   Subjective:    Patient ID: [name], male    DOB: March 30, 1951, 63 y.o.   MRN: [id number]  HPI The PT is here for follow up and re-evaluation of chronic medical conditions, medication management and review of any available recent lab and radiology data.  Preventive health is updated, specifically  Cancer screening and Immunization.   Hospitalized in 07/2013 with non stemi, cath showed significant CAD. The PT denies any adverse reactions to current medications since the last visit.  There are no new concerns except needs to clarify if he needs to remain on one of his anticoagulants  Also c/o cough productive of scant clear sputum, denies sinus pressure, nasal drainage or sore throat , denies fever or chills    Review of Systems See HPI Denies recent fever or chills. Denies sinus pressure, nasal congestion, ear pain or sore throat. Denies chest congestion, productive cough or wheezing. Denies chest pains, palpitations and leg swelling Denies abdominal pain, nausea, vomiting,diarrhea or constipation.   Denies dysuria, frequency, hesitancy or incontinence. Denies joint pain, swelling and limitation in mobility. Denies headaches, seizures, numbness, or tingling. Denies depression, anxiety or insomnia. .        Objective:   Physical Exam Patient alert and oriented and in no cardiopulmonary distress.  HEENT: No facial asymmetry, EOMI, no sinus tenderness,  oropharynx pink and moist.  Neck supple no adenopathy.No JVd  Chest: Clear to auscultation bilaterally.Decreased though adequate air entry throughout  CVS: S1, S2 systolic  murmurs, no S3.  ABD: Soft non tender. Bowel sounds normal.  Ext: No edema  MS: Adequate ROM spine, shoulders, hips and knees.  Skin: Healing laceration on left forearm due to unintentional trauma  Psych: Good eye contact, normal affect. Memory intact not anxious or depressed appearing.  CNS: CN 2-12 intact, power, tone and sensation normal  throughout.        Assessment & Plan:

## 2013-10-22 NOTE — Assessment & Plan Note (Signed)
Controlled, no change in medication DASH diet and commitment to daily physical activity for a minimum of 30 minutes discussed and encouraged, as a part of hypertension management. The importance of attaining a healthy weight is also discussed.  

## 2013-10-22 NOTE — Assessment & Plan Note (Signed)
Asymptomatic will discontinue plavix having completed one month of treatment following recent stent placement . He continues on xarelto

## 2013-10-22 NOTE — Assessment & Plan Note (Signed)
Uncontrolled Updated lab needed at/ before next visit. Hyperlipidemia:Low fat diet discussed and encouraged.   

## 2013-10-22 NOTE — Assessment & Plan Note (Signed)
No symptoms or signs of infection, tessalon perle prescribed

## 2013-10-22 NOTE — Progress Notes (Signed)
   Subjective:    Patient ID: [name], male    DOB: 10-31-1950, 63 y.o.   MRN: [id number]  HPI Accidental trauma to right forearm resulting in laceration, incompletely healed    Review of Systems     Objective:   Physical Exam  Laceration on forearm approx 2cm long, no purulent drainage      Assessment & Plan:  Needs TdaP

## 2013-11-22 ENCOUNTER — Encounter: Payer: Self-pay | Admitting: Cardiology

## 2013-11-22 NOTE — Progress Notes (Signed)
Clinical Summary [name] is a medically complex 63 y.o.male last seen in the office in December 2014. He continues to do fairly well from a cardiac perspective, no angina, no progressive shortness of breath. He reports compliance with his medications. Plavix has been discontinued following coronary intervention, he remains on aspirin and Xarelto without any reported bleeding problems.  He is due for a followup lipid panel and CMET, has paperwork to get it completed today.  At most recent evaluation in November 2014 included cardiac catheterization revealing severe single-vessel disease involving the RCA, now status post BMS placement in the setting of NSTEMI, otherwise nonobstructive disease within the LAD and circumflex. Aortic root was described as being severely calcified. Echocardiogram reported mild LVH with LVEF 55-60%, bioprosthetic AVR with calcification of the noncoronary cusp, no stenosis, trivial aortic regurgitation, no perivalvular leak.   No Known Allergies  Current Outpatient Prescriptions  Medication Sig Dispense Refill  . aspirin 81 MG tablet Take 81 mg by mouth daily.      [name] atorvastatin (LIPITOR) 80 MG tablet Take 1 tablet (80 mg total) by mouth daily at 6 PM.  30 tablet  3  . Ferrous Sulfate (IRON) 325 (65 FE) MG TABS Take 1 tablet by mouth daily.        [name] lisinopril (PRINIVIL,ZESTRIL) 5 MG tablet Take 1 tablet (5 mg total) by mouth daily.  30 tablet  3  . metoprolol tartrate (LOPRESSOR) 25 MG tablet Take 1 tablet (25 mg total) by mouth 2 (two) times daily.  60 tablet  3  . nitroGLYCERIN (NITROSTAT) 0.4 MG SL tablet Place 1 tablet (0.4 mg total) under the tongue every 5 (five) minutes as needed for chest pain.  25 tablet  3  . Rivaroxaban (XARELTO) 20 MG TABS tablet Take 1 tablet (20 mg total) by mouth daily.  30 tablet  3  . triamterene-hydrochlorothiazide (MAXZIDE) 75-50 MG per tablet Take 1 tablet by mouth daily.  30 tablet  11   No current facility-administered  medications for this visit.    Past Medical History  Diagnosis Date  . 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 Prosthetic valve endocarditis   . ETOH abuse     Has cut back significantly  . History of DVT (deep vein thrombosis) 2011  . S/P aortic valve replacement     Details not clear -  reportedly 1971 per patient  . Essential hypertension, benign   . History of GI bleed     February 2012   . CAD (coronary artery disease)     a. s/p PCI with 3.5 x 18 BMS to mid RCA, non obst LAD, Lcx patent 08/10/13  . History of echocardiogram     a. EF 55-60% b porcine AVR. not calcified, no AS, trivial AR b. EF Echo 08/10/13: 55-60%, mild LVH, normal AV function  . Embolus of femoral artery 2012    s/p surgical repair, reason for lifelong coumadin    Past Surgical History  Procedure Laterality Date  . Aoric valve replacement  January 2005    65mm Medtronic Freestyle stentless porcine bioprosthesis [facility])  . Left leg embolectomy  May 2012    Dr. Trula Slade - reason for lifelong coumadin  . Cardiac catheterization      30-40% mid LAD, 20-30% ostial D1, proximal 30-40% RCA, 95% mid RCA thrombus, 40% distal stenosis. EF 45%, inferior HK.    Social History  [name] reports that he has quit smoking. His smoking use included Cigarettes. He smoked 0.50 packs per day. He has never used smokeless tobacco. [name] reports that he drinks about 31.2 ounces of alcohol per week.  Review of Systems Recent "cold." No fevers or chills. Stable appetite. Otherwise negative.  Physical Examination Filed Vitals:   11/23/13 0810  BP: 142/86  Pulse: 83   Filed Weights   11/23/13 0810  Weight: 131 lb (59.421 kg)    Patient appears comfortable at rest.  HEENT: Conjunctiva and lids normal, disconjugate gaze, oropharynx clear with poor dentition.  Neck: Supple, elevated JVP or carotid bruits, no thyromegaly.  Lungs: Clear to  auscultation, nonlabored breathing at rest.  Cardiac: Regular rate and rhythm, no S3, 1/6 diastolic murmur heard at the right base and apex, no pericardial rub.  Abdomen: Soft, nontender, bowel sounds present, no guarding or rebound.  Extremities: No pitting edema, distal pulses 2+.  Skin: Warm and dry.  Musculoskeletal: No kyphosis.  Neuropsychiatric: Alert and oriented x3, affect grossly appropriate.   Problem List and Plan   Coronary atherosclerosis of native coronary artery Symptomatically stable status post BMS to the RCA in November in the setting of NSTEMI, otherwise nonobstructive disease. He completed month course of triple therapy, now on aspirin and Xarelto. Followup in 3 months.  S/P aortic valve replacement Aortic bioprosthetic valve stable with calcification of the noncoronary cusp, trivial aortic regurgitation, no perivalvular leak.  Arterial embolus of lower extremity Previous history of femoral artery embolus 2012. This is his indication for long-term anticoagulation.  Mixed hyperlipidemia Continues on Lipitor, followup FLP and LFT pending.    Satira Sark, M.D., F.A.C.C.

## 2013-11-23 ENCOUNTER — Encounter: Payer: Self-pay | Admitting: Cardiology

## 2013-11-23 ENCOUNTER — Ambulatory Visit (INDEPENDENT_AMBULATORY_CARE_PROVIDER_SITE_OTHER): Payer: Medicare Other | Admitting: Cardiology

## 2013-11-23 VITALS — BP 142/86 | HR 83 | Ht 64.0 in | Wt 131.0 lb

## 2013-11-23 DIAGNOSIS — I743 Embolism and thrombosis of arteries of the lower extremities: Secondary | ICD-10-CM

## 2013-11-23 DIAGNOSIS — Z125 Encounter for screening for malignant neoplasm of prostate: Secondary | ICD-10-CM

## 2013-11-23 DIAGNOSIS — E782 Mixed hyperlipidemia: Secondary | ICD-10-CM

## 2013-11-23 DIAGNOSIS — Z954 Presence of other heart-valve replacement: Secondary | ICD-10-CM

## 2013-11-23 DIAGNOSIS — Z952 Presence of prosthetic heart valve: Secondary | ICD-10-CM

## 2013-11-23 DIAGNOSIS — I2510 Atherosclerotic heart disease of native coronary artery without angina pectoris: Secondary | ICD-10-CM

## 2013-11-23 HISTORY — DX: Embolism and thrombosis of arteries of the lower extremities: I74.3

## 2013-11-23 LAB — COMPREHENSIVE METABOLIC PANEL
ALBUMIN: 4.8 g/dL (ref 3.5–5.2)
ALT: 19 U/L (ref 0–53)
AST: 31 U/L (ref 0–37)
Alkaline Phosphatase: 69 U/L (ref 39–117)
BUN: 23 mg/dL (ref 6–23)
CALCIUM: 9.2 mg/dL (ref 8.4–10.5)
CHLORIDE: 102 meq/L (ref 96–112)
CO2: 24 meq/L (ref 19–32)
Creat: 1.04 mg/dL (ref 0.50–1.35)
Glucose, Bld: 85 mg/dL (ref 70–99)
POTASSIUM: 4.1 meq/L (ref 3.5–5.3)
Sodium: 134 mEq/L — ABNORMAL LOW (ref 135–145)
TOTAL PROTEIN: 7.7 g/dL (ref 6.0–8.3)
Total Bilirubin: 1.2 mg/dL (ref 0.2–1.2)

## 2013-11-23 LAB — LIPID PANEL
Cholesterol: 103 mg/dL (ref 0–200)
HDL: 41 mg/dL (ref >39)
LDL CALC: 54 mg/dL (ref 0–99)
TRIGLYCERIDES: 40 mg/dL (ref <150)
Total CHOL/HDL Ratio: 2.5 Ratio
VLDL: 8 mg/dL (ref 0–40)

## 2013-11-23 NOTE — Assessment & Plan Note (Signed)
Symptomatically stable status post BMS to the RCA in November in the setting of NSTEMI, otherwise nonobstructive disease. He completed month course of triple therapy, now on aspirin and Xarelto. Followup in 3 months.

## 2013-11-23 NOTE — Assessment & Plan Note (Signed)
Previous history of femoral artery embolus 2012. This is his indication for long-term anticoagulation.

## 2013-11-23 NOTE — Patient Instructions (Addendum)
Your physician recommends that you schedule a follow-up appointment in:  3 months    Your physician recommends that you continue on your current medications as directed. Please refer to the Current Medication list given to you today.     Thank you for choosing [facility] !   

## 2013-11-23 NOTE — Assessment & Plan Note (Signed)
Continues on Lipitor, followup FLP and LFT pending.

## 2013-11-23 NOTE — Assessment & Plan Note (Signed)
Aortic bioprosthetic valve stable with calcification of the noncoronary cusp, trivial aortic regurgitation, no perivalvular leak. 

## 2013-11-24 LAB — PSA, MEDICARE: PSA: 3.38 ng/mL (ref <=4.00)

## 2013-11-29 ENCOUNTER — Telehealth: Payer: Self-pay | Admitting: *Deleted

## 2013-11-29 NOTE — Telephone Encounter (Signed)
Spoke with Pt , he states that Xarelto would cost him $100 out of pocket a month even with ARP. Tried to get pt to pick up some samples but he calls for a bus and has to call three days in advance to get a ride. Pt can not get help due to insurance. Please advise

## 2013-11-29 NOTE — Telephone Encounter (Signed)
Pt can not afford xerelto. States he out of pocket will be $100 a month

## 2013-11-30 ENCOUNTER — Other Ambulatory Visit: Payer: Self-pay

## 2013-11-30 ENCOUNTER — Telehealth: Payer: Self-pay | Admitting: *Deleted

## 2013-11-30 MED ORDER — EDOXABAN TOSYLATE 60 MG PO TABS: 60.0000 mg | ORAL_TABLET | Freq: Every day | ORAL | Status: DC

## 2013-11-30 MED ORDER — WARFARIN SODIUM 5 MG PO TABS: 5.0000 mg | ORAL_TABLET | Freq: Every day | ORAL | Status: DC

## 2013-11-30 NOTE — Telephone Encounter (Signed)
See previous telephone notes.  Pt unable to afford Xarelto.  Discussed having pt start back on coumadin but he has major transportation issues.  Discussed with Dr Domenic Polite re.starting pt on Savaysa due to $4 cash pay price/month and no need for monthly INR cheks.  He is in agreement.  Pt has been out of Xarelto 2 days.  Pt will not have transportation to drug store till tomorrow.  I have discussed increased risk of developing blood clot/CVA while off meds. He verbalized understanding.  Rx sent to [facility] for Savaysa 60mg  qd #30 / 3 refills.

## 2013-11-30 NOTE — Telephone Encounter (Signed)
He was on Coumadin previously, and could certainly go back on Coumadin with followup in the Coumadin clinic as before if this would be easier for him.

## 2013-11-30 NOTE — Telephone Encounter (Signed)
Apt made with [name]

## 2013-11-30 NOTE — Telephone Encounter (Signed)
As this is a patient of Dr. Domenic Polite, please forward to him to make recommendations. On Xarelto for history of femoral thrombus in 2010.

## 2013-11-30 NOTE — Telephone Encounter (Signed)
Per Kem Parkinson, pt will not start Coumadin but Tenna Child which will cost $4 [name] will call patient today with dosing

## 2013-12-01 ENCOUNTER — Telehealth: Payer: Self-pay | Admitting: *Deleted

## 2013-12-01 NOTE — Telephone Encounter (Signed)
I believe that this has already been taken care of. I reviewed this with [name] yesterday. Please see the recent telephone documentation.

## 2013-12-01 NOTE — Telephone Encounter (Signed)
LM with [employer] rep to help in procuring drug

## 2013-12-01 NOTE — Telephone Encounter (Signed)
I spoke with [name] who states he will personally help patient obtain medication

## 2013-12-01 NOTE — Telephone Encounter (Signed)
Pt states that he was switching from xerelto to something cheaper. States that [facility] in [place] can not get the new drug and we need to call them

## 2013-12-01 NOTE — Telephone Encounter (Signed)
Xarelto for pt $100/month states he cannot afford. Tenna Child is $4/month- (if not run through is insurance company-he pays out of pocket)  What recommendations do you have ?

## 2013-12-25 ENCOUNTER — Telehealth: Payer: Self-pay | Admitting: Cardiology

## 2013-12-25 NOTE — Telephone Encounter (Signed)
Received fax refill request  Rx # [id number] Medication:  Savaysa 60 mg tab Qty 30  Sig:  Take one tablet by mouth once daily Physician:  Domenic Polite  Prior Authorization please call [phone]

## 2013-12-26 ENCOUNTER — Telehealth: Payer: Self-pay

## 2013-12-26 NOTE — Telephone Encounter (Signed)
[name] [id number]  Ref number [id number]  48 hr turn around,call Thursday 4/9

## 2013-12-27 ENCOUNTER — Other Ambulatory Visit: Payer: Self-pay | Admitting: *Deleted

## 2013-12-27 NOTE — Telephone Encounter (Signed)
Pt is calling to check on this medication. Pt states that he was told that he would not have to pay for this and the pharmacy is teling him it will be over $200  I told pt that we would call him today and let him know what was going on

## 2013-12-27 NOTE — Telephone Encounter (Signed)
Prior authorization is approved til 09-20-14. Still awaiting Jerrold's call.

## 2013-12-27 NOTE — Telephone Encounter (Signed)
Still waiting on a prior auth on this pt. Called pharmacy to see if they can run it without  insurance and pharmacist from [facility] states it will still be 310 dollars a month without insurance.  Called Jerrold the drug rep and left a message for him. Awaiting his call back.

## 2013-12-28 ENCOUNTER — Other Ambulatory Visit: Payer: Self-pay | Admitting: *Deleted

## 2013-12-28 MED ORDER — EDOXABAN TOSYLATE 60 MG PO TABS
60.0000 mg | ORAL_TABLET | Freq: Every day | ORAL | Status: DC
Start: 2013-12-28 — End: 2014-03-12

## 2013-12-28 NOTE — Telephone Encounter (Signed)
Told pt he was approved. Spoke with Vinson Moselle, since pt had medicare with part D then this medication will be more expensive. We had some options for pt to choose. Pt will call back after he checks with pharmacy.

## 2014-01-04 ENCOUNTER — Ambulatory Visit (INDEPENDENT_AMBULATORY_CARE_PROVIDER_SITE_OTHER): Payer: Medicare Other | Admitting: *Deleted

## 2014-01-04 DIAGNOSIS — Z954 Presence of other heart-valve replacement: Secondary | ICD-10-CM

## 2014-01-04 DIAGNOSIS — I82409 Acute embolism and thrombosis of unspecified deep veins of unspecified lower extremity: Secondary | ICD-10-CM

## 2014-01-04 LAB — BASIC METABOLIC PANEL WITH GFR
BUN: 12 mg/dL (ref 6–23)
CALCIUM: 9.5 mg/dL (ref 8.4–10.5)
CO2: 26 mEq/L (ref 19–32)
Chloride: 96 mEq/L (ref 96–112)
Creat: 1.01 mg/dL (ref 0.50–1.35)
GFR, EST NON AFRICAN AMERICAN: 79 mL/min
GLUCOSE: 99 mg/dL (ref 70–99)
Potassium: 4.4 mEq/L (ref 3.5–5.3)
Sodium: 132 mEq/L — ABNORMAL LOW (ref 135–145)

## 2014-01-04 LAB — CBC
HCT: 40.9 % (ref 39.0–52.0)
Hemoglobin: 14 g/dL (ref 13.0–17.0)
MCH: 30 pg (ref 26.0–34.0)
MCHC: 34.2 g/dL (ref 30.0–36.0)
MCV: 87.8 fL (ref 78.0–100.0)
PLATELETS: 292 10*3/uL (ref 150–400)
RBC: 4.66 MIL/uL (ref 4.22–5.81)
RDW: 14 % (ref 11.5–15.5)
WBC: 11.8 10*3/uL — ABNORMAL HIGH (ref 4.0–10.5)

## 2014-01-04 NOTE — Patient Instructions (Addendum)
1 month Savaysa follow up  Started Savaysa 60mg  on 12/01/13.  Pt states he is tolerating med well.  Denies any bleeding, increased bruising or GI upset.  Savaysa samples given to pt.  Lot # [id number]  Exp 6/17  5 pks x 7 tablets each.  Pt sent to lab for CBC & BMP    BUN 12  SrCr 1.01 Hgb  14 Hct  40.9 CrCl  63.31 Wt 130.12 lb  Continue current dose of Savaya 60mg  daily.  F/U in 3 months

## 2014-01-05 ENCOUNTER — Encounter: Payer: Self-pay | Admitting: *Deleted

## 2014-02-27 ENCOUNTER — Encounter: Payer: Self-pay | Admitting: Family Medicine

## 2014-02-27 ENCOUNTER — Ambulatory Visit (INDEPENDENT_AMBULATORY_CARE_PROVIDER_SITE_OTHER): Payer: Medicare Other | Admitting: Family Medicine

## 2014-02-27 ENCOUNTER — Encounter (INDEPENDENT_AMBULATORY_CARE_PROVIDER_SITE_OTHER): Payer: Self-pay

## 2014-02-27 VITALS — BP 184/90 | HR 80 | Resp 16 | Ht 64.0 in | Wt 132.0 lb

## 2014-02-27 DIAGNOSIS — I10 Essential (primary) hypertension: Secondary | ICD-10-CM

## 2014-02-27 DIAGNOSIS — E782 Mixed hyperlipidemia: Secondary | ICD-10-CM

## 2014-02-27 DIAGNOSIS — Z0000 Encounter for general adult medical examination without abnormal findings: Secondary | ICD-10-CM

## 2014-02-27 MED ORDER — LISINOPRIL 5 MG PO TABS: 5.0000 mg | ORAL_TABLET | Freq: Every day | ORAL | Status: DC

## 2014-02-27 NOTE — Progress Notes (Signed)
Subjective:    Patient ID: [name], male    DOB: Feb 14, 1951, 63 y.o.   MRN: [id number]  HPI Preventive Screening-Counseling & Management   Patient present here today for a welcome to  Medicare visit.   Current Problems (verified)   Medications Prior to Visit Allergies (verified)   PAST HISTORY  Family History verified  Social History Lives with his girlfriend for 3 years , very supportive, out of work from age approx 40, was a Oncologist and odd jobs Current alcohol dependence still a problem, drink approx 10 beer per week   Risk Factors  Current exercise habits:  Physically active every day for at least 30 mins  Dietary issues discussed:heart healthy, low fat,;low sodium   Cardiac risk factors: established CAD and and cardiomyopathy  Depression Screen  (Note: if answer to either of the following is "Yes", a more complete depression screening is indicated)   Over the past two weeks, have you felt down, depressed or hopeless? No  Over the past two weeks, have you felt little interest or pleasure in doing things? No  Have you lost interest or pleasure in daily life? No  Do you often feel hopeless? No  Do you cry easily over simple problems? No   Activities of Daily Living  In your present state of health, do you have any difficulty performing the following activities?  Driving?: no license Managing money?: No Feeding yourself?:No Getting from bed to chair?:No Climbing a flight of stairs?:No Preparing food and eating?:No Bathing or showering?:No Getting dressed?:No Getting to the toilet?:No Using the toilet?:No Moving around from place to place?: yes to some extent gets tired more easily than he would if heart was not damaged  Fall Risk Assessment In the past year have you fallen or had a near fall?:No Are you currently taking any medications that make you dizzy?:No   Hearing Difficulties: No Do you often ask people to speak up or repeat  themselves?:No Do you experience ringing or noises in your ears?:No Do you have difficulty understanding soft or whispered voices?:No  Cognitive Testing  Alert? Yes Normal Appearance?Yes  Oriented to person? Yes Place? Yes  Time? Yes  Displays appropriate judgment?Yes  Can read the correct time from a watch face? yes Are you having problems remembering things?No  Advanced Directives have been discussed with the patient?Yes full code   List the Names of Other Physician/Practitioners you currently use: [facility]   Indicate any recent Medical Services you may have received from other than [facility] providers in the past year (date may be approximate).   Assessment:    Annual Wellness Exam   Plan:    During the course of the visit the patient was educated and counseled about appropriate screening and preventive services including:  A healthy diet is rich in fruit, vegetables and whole grains. Poultry fish, nuts and beans are a healthy choice for protein rather then red meat. A low sodium diet and drinking 64 ounces of water daily is generally recommended. Oils and sweet should be limited. Carbohydrates especially for those who are diabetic or overweight, should be limited to 30-45 gram per meal. It is important to eat on a regular schedule, at least 3 times daily. Snacks should be primarily fruits, vegetables or nuts. It is important that you exercise regularly at least 30 minutes 5 times a week. If you develop chest pain, have severe difficulty breathing, or feel very tired, stop exercising immediately and seek medical attention  Immunization reviewed and updated. Cancer screening reviewed and updated    Patient Instructions (the written plan) was given to the patient.  Medicare Attestation  I have personally reviewed:  The patient's medical and social history  Their use of alcohol, tobacco or illicit drugs  Their current medications and supplements  The patient's functional  ability including ADLs,fall risks, home safety risks, cognitive, and hearing and visual impairment  Diet and physical activities  Evidence for depression or mood disorders  The patient's weight, height, BMI, and visual acuity have been recorded in the chart. I have made referrals, counseling, and provided education to the patient based on review of the above and I have provided the patient with a written personalized care plan for preventive services.      Review of Systems     Objective:   Physical Exam        Assessment & Plan:  Welcome to Medicare preventive visit Welcome to medicare  exam as documented. Counseling done  re healthy lifestyle involving commitment to 150 minutes exercise per week, heart healthy diet, and maintaining healthy weight.The importance of adequate sleep also discussed. Regular seat belt use  is also discussed. Changes in health habits are decided on by the patient with goals and time frames  set for achieving them.Needs to stop alcohol, already has a damaged heart and h/o alcohol dependence Immunization and cancer screening needs are specifically addressed at this visit.referred for colonosocopy, needs zostavax

## 2014-02-27 NOTE — Patient Instructions (Addendum)
Annual physical exam in 3.5 month, call if you need me before  Please get all BP medication and do not miss taking them on any day   Pls work on stopping beer to prevent further heart damage  We will contact you if you need a colonoscopy  You may be referred for an [place] of your abdomen to ensure no aneurysm , I will call back and let you know Fasting lipid, cmp in 3.5 month

## 2014-03-01 ENCOUNTER — Other Ambulatory Visit: Payer: Self-pay

## 2014-03-01 MED ORDER — METOPROLOL TARTRATE 25 MG PO TABS: 25.0000 mg | ORAL_TABLET | Freq: Two times a day (BID) | ORAL | Status: DC

## 2014-03-01 MED ORDER — ATORVASTATIN CALCIUM 80 MG PO TABS: 80.0000 mg | ORAL_TABLET | Freq: Every day | ORAL | Status: DC

## 2014-03-02 ENCOUNTER — Ambulatory Visit: Payer: Medicare Other | Admitting: Cardiology

## 2014-03-03 ENCOUNTER — Other Ambulatory Visit: Payer: Self-pay | Admitting: Family Medicine

## 2014-03-03 DIAGNOSIS — Z1211 Encounter for screening for malignant neoplasm of colon: Secondary | ICD-10-CM

## 2014-03-03 DIAGNOSIS — Z0000 Encounter for general adult medical examination without abnormal findings: Secondary | ICD-10-CM | POA: Insufficient documentation

## 2014-03-03 NOTE — Assessment & Plan Note (Signed)
Welcome to medicare  exam as documented. Counseling done  re healthy lifestyle involving commitment to 150 minutes exercise per week, heart healthy diet, and maintaining healthy weight.The importance of adequate sleep also discussed. Regular seat belt use  is also discussed. Changes in health habits are decided on by the patient with goals and time frames  set for achieving them.Needs to stop alcohol, already has a damaged heart and h/o alcohol dependence Immunization and cancer screening needs are specifically addressed at this visit.referred for colonosocopy, needs zostavax

## 2014-03-06 ENCOUNTER — Telehealth: Payer: Self-pay | Admitting: *Deleted

## 2014-03-06 ENCOUNTER — Encounter (INDEPENDENT_AMBULATORY_CARE_PROVIDER_SITE_OTHER): Payer: Self-pay | Admitting: *Deleted

## 2014-03-06 DIAGNOSIS — R030 Elevated blood-pressure reading, without diagnosis of hypertension: Secondary | ICD-10-CM

## 2014-03-06 MED ORDER — LISINOPRIL 5 MG PO TABS: 5.0000 mg | ORAL_TABLET | Freq: Every day | ORAL | Status: DC

## 2014-03-06 MED ORDER — METOPROLOL TARTRATE 25 MG PO TABS
25.0000 mg | ORAL_TABLET | Freq: Two times a day (BID) | ORAL | Status: DC
Start: 2014-03-06 — End: 2014-07-05

## 2014-03-06 MED ORDER — TRIAMTERENE-HCTZ 75-50 MG PO TABS: 1.0000 | ORAL_TABLET | Freq: Every day | ORAL | Status: DC

## 2014-03-06 MED ORDER — ATORVASTATIN CALCIUM 80 MG PO TABS: 80.0000 mg | ORAL_TABLET | Freq: Every day | ORAL | Status: DC

## 2014-03-06 NOTE — Telephone Encounter (Signed)
Spoke with Pt. Pt just wanted his medications sent to [facility] in [place].

## 2014-03-06 NOTE — Telephone Encounter (Signed)
[facility] [place]. He states we keep calling them in [facility] Pt is scheduled for follow up with Dr Domenic Polite in July and needs his savaysa and atorvastatin refills.   Pt would like a nurse to call him back

## 2014-03-12 ENCOUNTER — Other Ambulatory Visit: Payer: Self-pay | Admitting: *Deleted

## 2014-03-12 MED ORDER — EDOXABAN TOSYLATE 60 MG PO TABS: 60.0000 mg | ORAL_TABLET | Freq: Every day | ORAL | Status: DC

## 2014-03-14 ENCOUNTER — Encounter (INDEPENDENT_AMBULATORY_CARE_PROVIDER_SITE_OTHER): Payer: Self-pay | Admitting: *Deleted

## 2014-03-14 ENCOUNTER — Telehealth (INDEPENDENT_AMBULATORY_CARE_PROVIDER_SITE_OTHER): Payer: Self-pay | Admitting: *Deleted

## 2014-03-14 ENCOUNTER — Other Ambulatory Visit (INDEPENDENT_AMBULATORY_CARE_PROVIDER_SITE_OTHER): Payer: Self-pay | Admitting: *Deleted

## 2014-03-14 DIAGNOSIS — Z1211 Encounter for screening for malignant neoplasm of colon: Secondary | ICD-10-CM

## 2014-03-14 MED ORDER — PEG 3350-KCL-NA BICARB-NACL 420 G PO SOLR
4000.0000 mL | Freq: Once | ORAL | Status: DC
Start: 2014-03-14 — End: 2014-04-20

## 2014-03-14 NOTE — Telephone Encounter (Signed)
Patient needs trilyte 

## 2014-03-26 ENCOUNTER — Ambulatory Visit (INDEPENDENT_AMBULATORY_CARE_PROVIDER_SITE_OTHER): Payer: Medicare Other | Admitting: Cardiology

## 2014-03-26 ENCOUNTER — Encounter: Payer: Self-pay | Admitting: Cardiology

## 2014-03-26 VITALS — BP 178/87 | HR 60 | Ht 64.0 in | Wt 131.2 lb

## 2014-03-26 DIAGNOSIS — Z952 Presence of prosthetic heart valve: Secondary | ICD-10-CM

## 2014-03-26 DIAGNOSIS — I743 Embolism and thrombosis of arteries of the lower extremities: Secondary | ICD-10-CM

## 2014-03-26 DIAGNOSIS — I10 Essential (primary) hypertension: Secondary | ICD-10-CM

## 2014-03-26 DIAGNOSIS — Z954 Presence of other heart-valve replacement: Secondary | ICD-10-CM

## 2014-03-26 DIAGNOSIS — E782 Mixed hyperlipidemia: Secondary | ICD-10-CM

## 2014-03-26 MED ORDER — LISINOPRIL 10 MG PO TABS: 10.0000 mg | ORAL_TABLET | Freq: Every day | ORAL | Status: DC

## 2014-03-26 NOTE — Assessment & Plan Note (Signed)
Continues on Lipitor, LDL 54 in March.

## 2014-03-26 NOTE — Patient Instructions (Signed)
Your physician wants you to follow-up in: 6 months You will receive a reminder letter in the mail two months in advance. If you don't receive a letter, please call our office to schedule the follow-up appointment.     Your physician has recommended you make the following change in your medication:     INCREASE Lisinopril to 10 mg daily   STOP Aspirin  Please get blood work in 2 weeks  (CBC,BMET)      Thank you for choosing [facility] !

## 2014-03-26 NOTE — Assessment & Plan Note (Signed)
Aortic bioprosthetic valve stable with calcification of the noncoronary cusp, trivial aortic regurgitation, no perivalvular leak.

## 2014-03-26 NOTE — Assessment & Plan Note (Signed)
No active angina symptoms status post BMS to the RCA in November 2014. Aspirin will be stopped since he is concurrently on Savaysa. Continue beta blocker and statin.

## 2014-03-26 NOTE — Progress Notes (Signed)
Clinical Summary [name] is a medically complex 63 y.o.male last seen in March of this year. He reports no progressive angina symptoms, no palpitations, has had no cardiac hospitalizations. He is now on Savaysa for anticoagulation (low cost), reports no bleeding problems.  Lab work from March showed cholesterol 103, triglycerides 40, HDL 41, LDL 54, potassium 4.1, BUN 23, creatinine 1.0, normal LFTs  Most recent evaluation in November 2014 included cardiac catheterization revealing severe single-vessel disease involving the RCA, now status post BMS placement in the setting of NSTEMI, otherwise nonobstructive disease within the LAD and circumflex. Aortic root was described as being severely calcified. Echocardiogram reported mild LVH with LVEF 55-60%, bioprosthetic AVR with calcification of the noncoronary cusp, no stenosis, trivial aortic regurgitation, no perivalvular leak.  ECG today shows sinus bradycardia with prolonged PR interval, left anterior fascicular block, R prime in V1, diffuse nonspecific ST changes.   No Known Allergies  Current Outpatient Prescriptions  Medication Sig Dispense Refill  . atorvastatin (LIPITOR) 80 MG tablet Take 1 tablet (80 mg total) by mouth daily at 6 PM.  30 tablet  3  . Edoxaban Tosylate (SAVAYSA) 60 MG TABS Take 60 mg by mouth daily at 6 PM.  30 tablet  3  . Ferrous Sulfate (IRON) 325 (65 FE) MG TABS Take 1 tablet by mouth daily.        [name] lisinopril (PRINIVIL,ZESTRIL) 10 MG tablet Take 1 tablet (10 mg total) by mouth daily.  90 tablet  3  . metoprolol tartrate (LOPRESSOR) 25 MG tablet Take 1 tablet (25 mg total) by mouth 2 (two) times daily.  60 tablet  3  . nitroGLYCERIN (NITROSTAT) 0.4 MG SL tablet Place 1 tablet (0.4 mg total) under the tongue every 5 (five) minutes as needed for chest pain.  25 tablet  3  . polyethylene glycol-electrolytes (TRILYTE) 420 G solution Take 4,000 mLs by mouth once.  4000 mL  0  . triamterene-hydrochlorothiazide  (MAXZIDE) 75-50 MG per tablet Take 1 tablet by mouth daily.  30 tablet  3   No current facility-administered medications for this visit.    Past Medical History  Diagnosis Date  . 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 Prosthetic valve endocarditis   . ETOH abuse     Has cut back significantly  . History of DVT (deep vein thrombosis) 2011  . S/P aortic valve replacement     Details not clear -  reportedly 1971 per patient  . Essential hypertension, benign   . History of GI bleed     February 2012   . CAD (coronary artery disease)     a. s/p PCI with 3.5 x 18 BMS to mid RCA, non obst LAD, Lcx patent 08/10/13  . History of echocardiogram     a. EF 55-60% b porcine AVR. not calcified, no AS, trivial AR b. EF Echo 08/10/13: 55-60%, mild LVH, normal AV function  . Embolus of femoral artery 2012    s/p surgical repair, reason for lifelong coumadin    Past Surgical History  Procedure Laterality Date  . Aoric valve replacement  January 2005    34mm Medtronic Freestyle stentless porcine bioprosthesis [facility])  . Left leg embolectomy  May 2012    [name] - reason for lifelong coumadin  . Cardiac catheterization      30-40% mid LAD, 20-30% ostial D1, proximal 30-40% RCA, 95% mid RCA thrombus,  40% distal stenosis. EF 45%, inferior HK.  . Coronary angioplasty with stent placement  07/2013    Social History [name] reports that he has quit smoking. His smoking use included Cigarettes. He smoked 0.50 packs per day. He has never used smokeless tobacco. [name] reports that he drinks about 6 ounces of alcohol per week.  Review of Systems Stable appetite. No orthopnea or PND. No fevers or chills. Other systems reviewed and negative.  Physical Examination Filed Vitals:   03/26/14 0826  BP: 178/87  Pulse: 60   Filed Weights   03/26/14 0826  Weight: 131 lb 3.2 oz (59.512 kg)    Patient appears  comfortable at rest.  HEENT: Conjunctiva and lids normal, disconjugate gaze, oropharynx clear with poor dentition.  Neck: Supple, elevated JVP or carotid bruits, no thyromegaly.  Lungs: Clear to auscultation, nonlabored breathing at rest.  Cardiac: Regular rate and rhythm, no S3, 1/6 diastolic murmur heard at the right base and apex, no pericardial rub.  Abdomen: Soft, nontender, bowel sounds present, no guarding or rebound.  Extremities: No pitting edema, distal pulses 2+.  Skin: Warm and dry.  Musculoskeletal: No kyphosis.  Neuropsychiatric: Alert and oriented x3, affect grossly appropriate.   Problem List and Plan   Coronary atherosclerosis of native coronary artery No active angina symptoms status post BMS to the RCA in November 2014. Aspirin will be stopped since he is concurrently on Savaysa. Continue beta blocker and statin.  Arterial embolus of lower extremity This has been his long-term indication for anticoagulation. He continues on Savaysa. Followup CBC and BMET.  Essential hypertension, benign Increase lisinopril to 10 mg daily. Followup BMET in 2 weeks.  S/P aortic valve replacement Aortic bioprosthetic valve stable with calcification of the noncoronary cusp, trivial aortic regurgitation, no perivalvular leak.  Mixed hyperlipidemia Continues on Lipitor, LDL 54 in March.    Satira Sark, M.D., F.A.C.C.

## 2014-03-26 NOTE — Assessment & Plan Note (Signed)
This has been his long-term indication for anticoagulation. He continues on Savaysa. Followup CBC and BMET.

## 2014-03-26 NOTE — Assessment & Plan Note (Signed)
Increase lisinopril to 10 mg daily. Followup BMET in 2 weeks.

## 2014-03-29 ENCOUNTER — Telehealth (INDEPENDENT_AMBULATORY_CARE_PROVIDER_SITE_OTHER): Payer: Self-pay | Admitting: *Deleted

## 2014-03-29 NOTE — Telephone Encounter (Signed)
  Procedure: tcs  Reason/Indication:  screening  Has patient had this procedure before?  no  If so, when, by whom and where?    Is there a family history of colon cancer?  no  Who?  What age when diagnosed?    Is patient diabetic?   no      Does patient have prosthetic heart valve?  no  Do you have a pacemaker?  no  Has patient ever had endocarditis? no  Has patient had joint replacement within last 12 months?  no  Does patient tend to be constipated or take laxatives? no  Is patient on Coumadin, Plavix and/or Aspirin? yes  Medications: see EPIC  Allergies: nkda  Medication Adjustment: asa 2 days, iron 10 days  Procedure date & time: 04/20/14 at 930

## 2014-04-02 NOTE — Telephone Encounter (Signed)
agree

## 2014-04-12 ENCOUNTER — Encounter (HOSPITAL_COMMUNITY): Payer: Self-pay | Admitting: Pharmacy Technician

## 2014-04-20 ENCOUNTER — Ambulatory Visit (HOSPITAL_COMMUNITY)
Admission: RE | Admit: 2014-04-20 | Discharge: 2014-04-20 | Disposition: A | Discharge status: Home / Self Care | Payer: Medicare Other | Source: Ambulatory Visit | Attending: Internal Medicine | Admitting: Internal Medicine

## 2014-04-20 ENCOUNTER — Encounter (HOSPITAL_COMMUNITY)
Admission: RE | Disposition: A | Discharge status: Home / Self Care | Payer: Self-pay | Source: Ambulatory Visit | Attending: Internal Medicine

## 2014-04-20 ENCOUNTER — Encounter (HOSPITAL_COMMUNITY): Payer: Self-pay | Admitting: *Deleted

## 2014-04-20 DIAGNOSIS — Z7901 Long term (current) use of anticoagulants: Secondary | ICD-10-CM | POA: Insufficient documentation

## 2014-04-20 DIAGNOSIS — Z1211 Encounter for screening for malignant neoplasm of colon: Secondary | ICD-10-CM

## 2014-04-20 DIAGNOSIS — I10 Essential (primary) hypertension: Secondary | ICD-10-CM | POA: Insufficient documentation

## 2014-04-20 DIAGNOSIS — Z7982 Long term (current) use of aspirin: Secondary | ICD-10-CM | POA: Insufficient documentation

## 2014-04-20 DIAGNOSIS — F3289 Other specified depressive episodes: Secondary | ICD-10-CM | POA: Diagnosis not present

## 2014-04-20 DIAGNOSIS — K644 Residual hemorrhoidal skin tags: Secondary | ICD-10-CM

## 2014-04-20 DIAGNOSIS — Z79899 Other long term (current) drug therapy: Secondary | ICD-10-CM | POA: Diagnosis not present

## 2014-04-20 DIAGNOSIS — Z86718 Personal history of other venous thrombosis and embolism: Secondary | ICD-10-CM | POA: Diagnosis not present

## 2014-04-20 DIAGNOSIS — K5730 Diverticulosis of large intestine without perforation or abscess without bleeding: Secondary | ICD-10-CM | POA: Insufficient documentation

## 2014-04-20 DIAGNOSIS — Z954 Presence of other heart-valve replacement: Secondary | ICD-10-CM | POA: Insufficient documentation

## 2014-04-20 DIAGNOSIS — I2510 Atherosclerotic heart disease of native coronary artery without angina pectoris: Secondary | ICD-10-CM | POA: Diagnosis not present

## 2014-04-20 DIAGNOSIS — Z87891 Personal history of nicotine dependence: Secondary | ICD-10-CM | POA: Diagnosis not present

## 2014-04-20 DIAGNOSIS — F329 Major depressive disorder, single episode, unspecified: Secondary | ICD-10-CM | POA: Diagnosis not present

## 2014-04-20 HISTORY — PX: COLONOSCOPY: SHX5424

## 2014-04-20 HISTORY — DX: Headache: R51

## 2014-04-20 HISTORY — DX: Major depressive disorder, single episode, unspecified: F32.9

## 2014-04-20 HISTORY — DX: Unspecified osteoarthritis, unspecified site: M19.90

## 2014-04-20 SURGERY — COLONOSCOPY
Anesthesia: Moderate Sedation

## 2014-04-20 MED ORDER — MEPERIDINE HCL 50 MG/ML IJ SOLN
INTRAMUSCULAR | Status: AC
  Filled 2014-04-20: qty 1

## 2014-04-20 MED ORDER — MIDAZOLAM HCL 5 MG/5ML IJ SOLN
INTRAMUSCULAR | Status: DC | PRN
  Administered 2014-04-20 (×2): 2 mg via INTRAVENOUS

## 2014-04-20 MED ORDER — SODIUM CHLORIDE 0.9 % IV SOLN
INTRAVENOUS | Status: DC
Start: 2014-04-20 — End: 2014-04-20
  Administered 2014-04-20: 09:00:00 via INTRAVENOUS

## 2014-04-20 MED ORDER — MIDAZOLAM HCL 5 MG/5ML IJ SOLN
INTRAMUSCULAR | Status: AC
  Filled 2014-04-20: qty 10

## 2014-04-20 MED ORDER — MEPERIDINE HCL 50 MG/ML IJ SOLN
INTRAMUSCULAR | Status: DC | PRN
  Administered 2014-04-20 (×2): 25 mg via INTRAVENOUS

## 2014-04-20 NOTE — Discharge Instructions (Signed)
Presumed usual medications and high fiber diet. No driving for 24 hours. Next screening exam in 10 years.    High-Fiber Diet Fiber is found in fruits, vegetables, and grains. A high-fiber diet encourages the addition of more whole grains, legumes, fruits, and vegetables in your diet. The recommended amount of fiber for adult males is 38 g per day. For adult females, it is 25 g per day. Pregnant and lactating women should get 28 g of fiber per day. If you have a digestive or bowel problem, ask your caregiver for advice before adding high-fiber foods to your diet. Eat a variety of high-fiber foods instead of only a select few type of foods.  PURPOSE  To increase stool bulk.  To make bowel movements more regular to prevent constipation.  To lower cholesterol.  To prevent overeating. WHEN IS THIS DIET USED?  It may be used if you have constipation and hemorrhoids.  It may be used if you have uncomplicated diverticulosis (intestine condition) and irritable bowel syndrome.  It may be used if you need help with weight management.  It may be used if you want to add it to your diet as a protective measure against atherosclerosis, diabetes, and cancer. SOURCES OF FIBER  Whole-grain breads and cereals.  Fruits, such as apples, oranges, bananas, berries, prunes, and pears.  Vegetables, such as green peas, carrots, sweet potatoes, beets, broccoli, cabbage, spinach, and artichokes.  Legumes, such split peas, soy, lentils.  Almonds. FIBER CONTENT IN FOODS Starches and Grains / Dietary Fiber (g)  Cheerios, 1 cup / 3 g  Corn Flakes cereal, 1 cup / 0.7 g  Rice crispy treat cereal, 1 cup / 0.3 g  Instant oatmeal (cooked),  cup / 2 g  Frosted wheat cereal, 1 cup / 5.1 g  Brown, long-grain rice (cooked), 1 cup / 3.5 g  White, long-grain rice (cooked), 1 cup / 0.6 g  Enriched macaroni (cooked), 1 cup / 2.5 g Legumes / Dietary Fiber (g)  Baked beans (canned, plain, or vegetarian),   cup / 5.2 g  Kidney beans (canned),  cup / 6.8 g  Pinto beans (cooked),  cup / 5.5 g Breads and Crackers / Dietary Fiber (g)  Plain or honey graham crackers, 2 squares / 0.7 g  Saltine crackers, 3 squares / 0.3 g  Plain, salted pretzels, 10 pieces / 1.8 g  Whole-wheat bread, 1 slice / 1.9 g  White bread, 1 slice / 0.7 g  Raisin bread, 1 slice / 1.2 g  Plain bagel, 3 oz / 2 g  Flour tortilla, 1 oz / 0.9 g  Corn tortilla, 1 small / 1.5 g  Hamburger or hotdog bun, 1 small / 0.9 g Fruits / Dietary Fiber (g)  Apple with skin, 1 medium / 4.4 g  Sweetened applesauce,  cup / 1.5 g  Banana,  medium / 1.5 g  Grapes, 10 grapes / 0.4 g  Orange, 1 small / 2.3 g  Raisin, 1.5 oz / 1.6 g  Melon, 1 cup / 1.4 g Vegetables / Dietary Fiber (g)  Green beans (canned),  cup / 1.3 g  Carrots (cooked),  cup / 2.3 g  Broccoli (cooked),  cup / 2.8 g  Peas (cooked),  cup / 4.4 g  Mashed potatoes,  cup / 1.6 g  Lettuce, 1 cup / 0.5 g  Corn (canned),  cup / 1.6 g  Tomato,  cup / 1.1 g Document Released: 09/07/2005 Document Revised: 03/08/2012 Document Reviewed: 12/10/2011 ExitCare  Patient Information 2015 [place]. This information is not intended to replace advice given to you by your health care provider. Make sure you discuss any questions you have with your health care provider. Colonoscopy, Care After These instructions give you information on caring for yourself after your procedure. Your doctor may also give you more specific instructions. Call your doctor if you have any problems or questions after your procedure. HOME CARE  Do not drive for 24 hours.  Do not sign important papers or use machinery for 24 hours.  You may shower.  You may go back to your usual activities, but go slower for the first 24 hours.  Take rest breaks often during the first 24 hours.  Walk around or use warm packs on your belly (abdomen) if you have belly cramping or  gas.  Drink enough fluids to keep your pee (urine) clear or pale yellow.  Resume your normal diet. Avoid heavy or fried foods.  Avoid drinking alcohol for 24 hours or as told by your doctor.  Only take medicines as told by your doctor. If a tissue sample (biopsy) was taken during the procedure:   Do not take aspirin or blood thinners for 7 days, or as told by your doctor.  Do not drink alcohol for 7 days, or as told by your doctor.  Eat soft foods for the first 24 hours. GET HELP IF: You still have a small amount of blood in your poop (stool) 2-3 days after the procedure. GET HELP RIGHT AWAY IF:  You have more than a small amount of blood in your poop.  You see clumps of tissue (blood clots) in your poop.  Your belly is puffy (swollen).  You feel sick to your stomach (nauseous) or throw up (vomit).  You have a fever.  You have belly pain that gets worse and medicine does not help. MAKE SURE YOU:  Understand these instructions.  Will watch your condition.  Will get help right away if you are not doing well or get worse. Document Released: 10/10/2010 Document Revised: 09/12/2013 Document Reviewed: 05/15/2013 [facility] Patient Information 2015 [place], [place]. This information is not intended to replace advice given to you by your health care provider. Make sure you discuss any questions you have with your health care provider.

## 2014-04-20 NOTE — Progress Notes (Signed)
Dr Laural Golden aware of pt's last dose of Savaysa 60mg   04/19/2014 @ 1800

## 2014-04-20 NOTE — H&P (Signed)
[name] is an 63 y.o. male.   Chief Complaint: Patient is here for colonoscopy. HPI: Patient is 63 year old male with multiple medical problems including prosthetic aortic valve who has an anticoagulant and is here for screening colonoscopy. He denies abdominal pain changes from his rectal bleeding. Family history is negative for CRC. Patient is on Edoxaban which has not helped for 2 days. Last dose was yesterday.  Past Medical History  Diagnosis Date  . 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 Prosthetic valve endocarditis   . ETOH abuse     Has cut back significantly  . History of DVT (deep vein thrombosis) 2011  . S/P aortic valve replacement     Details not clear -  reportedly 1971 per patient  . Essential hypertension, benign   . History of GI bleed     February 2012   . CAD (coronary artery disease)     a. s/p PCI with 3.5 x 18 BMS to mid RCA, non obst LAD, Lcx patent 08/10/13  . History of echocardiogram     a. EF 55-60% b porcine AVR. not calcified, no AS, trivial AR b. EF Echo 08/10/13: 55-60%, mild LVH, normal AV function  . Embolus of femoral artery 2012    s/p surgical repair, reason for lifelong coumadin  . Shortness of breath   . Depression   . Headache(784.0)   . Arthritis     Past Surgical History  Procedure Laterality Date  . Aoric valve replacement  January 2005    71mm Medtronic Freestyle stentless porcine bioprosthesis [facility])  . Left leg embolectomy  May 2012    Dr. Trula Slade - reason for lifelong coumadin  . Cardiac catheterization      30-40% mid LAD, 20-30% ostial D1, proximal 30-40% RCA, 95% mid RCA thrombus, 40% distal stenosis. EF 45%, inferior HK.  . Coronary angioplasty with stent placement  07/2013    Family History  Problem Relation Age of Onset  . Diabetes Mother   . Cirrhosis Father   . COPD Sister    Social History:  reports that he has quit smoking. His  smoking use included Cigarettes. He smoked 0.50 packs per day. He has never used smokeless tobacco. He reports that he drinks about 6 ounces of alcohol per week. He reports that he does not use illicit drugs.  Allergies: No Known Allergies  Medications Prior to Admission  Medication Sig Dispense Refill  . aspirin EC 81 MG tablet Take 81 mg by mouth daily.      [name] atorvastatin (LIPITOR) 80 MG tablet Take 1 tablet (80 mg total) by mouth daily at 6 PM.  30 tablet  3  . Edoxaban Tosylate (SAVAYSA) 60 MG TABS Take 60 mg by mouth daily at 6 PM.  30 tablet  3  . Ferrous Sulfate (IRON) 325 (65 FE) MG TABS Take 1 tablet by mouth daily.        [name] lisinopril (PRINIVIL,ZESTRIL) 10 MG tablet Take 1 tablet (10 mg total) by mouth daily.  90 tablet  3  . metoprolol tartrate (LOPRESSOR) 25 MG tablet Take 1 tablet (25 mg total) by mouth 2 (two) times daily.  60 tablet  3  . polyethylene glycol-electrolytes (TRILYTE) 420 G solution Take 4,000 mLs by mouth once.  4000 mL  0  . triamterene-hydrochlorothiazide (MAXZIDE) 75-50 MG per tablet Take 1 tablet by mouth daily.  30 tablet  3  .  nitroGLYCERIN (NITROSTAT) 0.4 MG SL tablet Place 1 tablet (0.4 mg total) under the tongue every 5 (five) minutes as needed for chest pain.  25 tablet  3    No results found for this or any previous visit (from the past 48 hour(s)). No results found.  ROS  Blood pressure 157/97, temperature 97.5 F (36.4 C), temperature source Oral, resp. rate 14, height 5\' 4"  (1.626 m), weight 132 lb (59.875 kg), SpO2 98.00%. Physical Exam  Constitutional:  Well-developed thin male in NAD  HENT:  Mouth/Throat: Oropharynx is clear and moist.  Eyes: Conjunctivae are normal. No scleral icterus.  Neck: No thyromegaly present.  Cardiovascular:  Regular rhythm normal S1 and loud S2. No murmur noted.  Respiratory: Effort normal and breath sounds normal.  GI: Soft. He exhibits no distension and no mass. There is no tenderness.  Musculoskeletal: He  exhibits no edema.  Lymphadenopathy:    He has no cervical adenopathy.  Neurological: He is alert.  Skin: Skin is warm.     Assessment/Plan Average risk screening colonoscopy. Patient is on Edoxaban, therefore may have to use clips if he needs polypectomy.  REHMAN,NAJEEB U 04/20/2014, 9:23 AM

## 2014-04-20 NOTE — Op Note (Signed)
COLONOSCOPY PROCEDURE REPORT  PATIENT:  [name]  MR#:  [id number] Birthdate:  11/14/1950, 63 y.o., male Endoscopist:  Dr. Rogene Houston, MD Referred By:  Dr. Tula Nakayama, MD  Procedure Date: 04/20/2014  Procedure:   Colonoscopy  Indications: Patient is 63 year old African American male who is undergoing a screening colonoscopy. Patient did not inform us that he was on blood thinner. He is on Endoxaban with half-life of about 12 hours. If he needs polypectomy clips would be used to prevent risk of bleeding.  Informed Consent:  The procedure and risks were reviewed with the patient and informed consent was obtained.  Medications:  Demerol 50 mg IV Versed 4 mg IV  Description of procedure:  After a digital rectal exam was performed, that colonoscope was advanced from the anus through the rectum and colon to the area of the cecum, ileocecal valve and appendiceal orifice. The cecum was deeply intubated. These structures were well-seen and photographed for the record. From the level of the cecum and ileocecal valve, the scope was slowly and cautiously withdrawn. The mucosal surfaces were carefully surveyed utilizing scope tip to flexion to facilitate fold flattening as needed. The scope was pulled down into the rectum where a thorough exam including retroflexion was performed.  Findings:   Prep excellent. Normal mucosa of cecum ascending colon hepatic flexure transverse colon and descending colon. Few scattered diverticula noted at sigmoid colon. Normal rectal mucosa. Small hemorrhoids below the dentate line.   Therapeutic/Diagnostic Maneuvers Performed:  None  Complications:  None  Cecal Withdrawal Time:  6 minutes  Impression:  Examination performed to cecum. Mild sigmoid colon diverticulosis and external hemorrhoids. No evidence of colonic polyps.  Recommendations:  Standard instructions given. High fiber diet. Next screening exam in 10 years.  REHMAN,NAJEEB U   04/20/2014 9:59 AM  CC: Dr. Tula Nakayama, MD & Dr. Rayne Du ref. provider found

## 2014-07-04 ENCOUNTER — Encounter (INDEPENDENT_AMBULATORY_CARE_PROVIDER_SITE_OTHER): Payer: Self-pay

## 2014-07-04 ENCOUNTER — Ambulatory Visit (INDEPENDENT_AMBULATORY_CARE_PROVIDER_SITE_OTHER): Payer: Medicare Other | Admitting: Family Medicine

## 2014-07-04 ENCOUNTER — Encounter: Payer: Self-pay | Admitting: Family Medicine

## 2014-07-04 VITALS — BP 130/70 | HR 60 | Resp 16 | Ht 64.0 in | Wt 134.4 lb

## 2014-07-04 DIAGNOSIS — Z0000 Encounter for general adult medical examination without abnormal findings: Secondary | ICD-10-CM

## 2014-07-04 DIAGNOSIS — Z7901 Long term (current) use of anticoagulants: Secondary | ICD-10-CM

## 2014-07-04 DIAGNOSIS — E782 Mixed hyperlipidemia: Secondary | ICD-10-CM

## 2014-07-04 DIAGNOSIS — Z1211 Encounter for screening for malignant neoplasm of colon: Secondary | ICD-10-CM

## 2014-07-04 DIAGNOSIS — Z23 Encounter for immunization: Secondary | ICD-10-CM | POA: Insufficient documentation

## 2014-07-04 LAB — POC HEMOCCULT BLD/STL (OFFICE/1-CARD/DIAGNOSTIC): FECAL OCCULT BLD: NEGATIVE

## 2014-07-04 NOTE — Assessment & Plan Note (Signed)
Annual exam as documented. Counseling done  re healthy lifestyle involving commitment to 150 minutes exercise per week, heart healthy diet, and attaining healthy weight.The importance of adequate sleep also discussed. Regular seat belt use and home safety, is also discussed.  Immunization and cancer screening needs are specifically addressed at this visit.  

## 2014-07-04 NOTE — Assessment & Plan Note (Signed)
Vaccine administered at visit.  

## 2014-07-04 NOTE — Patient Instructions (Signed)
F/u in 4.5 month, call if you need me before  Flu vaccine today  Please seek dental care, otherwise exam is good  Lipid, and cmp today

## 2014-07-04 NOTE — Progress Notes (Signed)
   Subjective:    Patient ID: [name], male    DOB: Feb 19, 1951, 63 y.o.   MRN: [id number]  HPI Patient is in for annual exam. No other health concerns are expressed . Flu vaccine iis administered    Review of Systems   See HPI  Objective:   Physical Exam BP 130/70  Pulse 60  Resp 16  Ht 5\' 4"  (1.626 m)  Wt 134 lb 6.4 oz (60.963 kg)  BMI 23.06 kg/m2  SpO2 98%  Pleasant well nourished male, alert and oriented x 3, in no cardio-pulmonary distress. Afebrile. HEENT No facial trauma or asymetry. Sinuses non tender. EOMI, PERTL, fundoscopic exam is negative for hemorhages or exudates. External ears normal, tympanic membranes clear. Oropharynx moist, no exudate, poor  dentition. Neck: supple, no adenopathy,JVD or thyromegaly.No bruits.  Chest: Clear to ascultation bilaterally.No crackles or wheezes. Non tender to palpation  Breast: No asymetry,no masses. No nipple discharge or inversion. No axillary or supraclavicular adenopathy  Cardiovascular system; Heart sounds normal,  S1 and  S2 ,no S3.  1/6 diastolic rumble at apex,no  thrill. Apical beat not displaced Peripheral pulses normal.  Abdomen: Soft, non tender, no organomegaly or masses. No bruits. Bowel sounds normal. No guarding, tenderness or rebound.  Rectal:  Normal sphincter tone. No hemorrhoids or  masses. guaiac negative stool. Prostate smooth and firm  .  Musculoskeletal exam: Full ROM of spine, hips , shoulders and knees. No deformity ,swelling or crepitus noted. No muscle wasting or atrophy.   Neurologic: Cranial nerves 2 to 12 intact. Power, tone ,sensation and reflexes normal throughout. No disturbance in gait. No tremor.  Skin: Intact, no ulceration, erythema , scaling or rash noted. Pigmentation normal throughout  Psych; Normal mood and affect. Judgement and concentration normal         Assessment & Plan:  Annual physical exam Annual exam as documented. Counseling  done  re healthy lifestyle involving commitment to 150 minutes exercise per week, heart healthy diet, and attaining healthy weight.The importance of adequate sleep also discussed. Regular seat belt use and home safety , is also discussed. Immunization and cancer screening needs are specifically addressed at this visit.   Need for prophylactic vaccination and inoculation against influenza Vaccine administered at visit.

## 2014-07-05 ENCOUNTER — Other Ambulatory Visit: Payer: Self-pay

## 2014-07-05 DIAGNOSIS — R030 Elevated blood-pressure reading, without diagnosis of hypertension: Secondary | ICD-10-CM

## 2014-07-05 LAB — LIPID PANEL
CHOLESTEROL: 108 mg/dL (ref 0–200)
HDL: 44 mg/dL (ref >39)
LDL Cholesterol: 45 mg/dL (ref 0–99)
TRIGLYCERIDES: 93 mg/dL (ref <150)
Total CHOL/HDL Ratio: 2.5 Ratio
VLDL: 19 mg/dL (ref 0–40)

## 2014-07-05 LAB — COMPREHENSIVE METABOLIC PANEL
ALBUMIN: 4.6 g/dL (ref 3.5–5.2)
ALT: 18 U/L (ref 0–53)
AST: 27 U/L (ref 0–37)
Alkaline Phosphatase: 71 U/L (ref 39–117)
BUN: 13 mg/dL (ref 6–23)
CHLORIDE: 97 meq/L (ref 96–112)
CO2: 29 meq/L (ref 19–32)
Calcium: 9.9 mg/dL (ref 8.4–10.5)
Creat: 0.87 mg/dL (ref 0.50–1.35)
GLUCOSE: 88 mg/dL (ref 70–99)
POTASSIUM: 4.4 meq/L (ref 3.5–5.3)
Sodium: 134 mEq/L — ABNORMAL LOW (ref 135–145)
Total Bilirubin: 1.2 mg/dL (ref 0.2–1.2)
Total Protein: 7.9 g/dL (ref 6.0–8.3)

## 2014-07-05 MED ORDER — METOPROLOL TARTRATE 25 MG PO TABS: 25.0000 mg | ORAL_TABLET | Freq: Two times a day (BID) | ORAL | Status: DC

## 2014-07-05 MED ORDER — TRIAMTERENE-HCTZ 75-50 MG PO TABS: 1.0000 | ORAL_TABLET | Freq: Every day | ORAL | Status: DC

## 2014-07-05 MED ORDER — LISINOPRIL 10 MG PO TABS: 10.0000 mg | ORAL_TABLET | Freq: Every day | ORAL | Status: DC

## 2014-07-05 MED ORDER — ATORVASTATIN CALCIUM 80 MG PO TABS: 80.0000 mg | ORAL_TABLET | Freq: Every day | ORAL | Status: DC

## 2014-07-25 ENCOUNTER — Other Ambulatory Visit: Payer: Self-pay | Admitting: Adult Health

## 2014-07-26 ENCOUNTER — Other Ambulatory Visit: Payer: Self-pay | Admitting: Adult Health

## 2014-07-26 NOTE — Telephone Encounter (Signed)
Already refilled

## 2014-07-30 ENCOUNTER — Telehealth: Payer: Self-pay | Admitting: *Deleted

## 2014-07-30 NOTE — Telephone Encounter (Signed)
Spoke with [facility],medication is $4 thru courtesy card Pt made aware to pick up today as he states he is completely out

## 2014-07-30 NOTE — Telephone Encounter (Signed)
Pt is on Savays not coumadin.  Pt states they still want to charge him a $140.  Please call pt since you dealt with this last time.  Thanks.

## 2014-07-30 NOTE — Telephone Encounter (Signed)
Wants call back to discuss his Coumadin / tgs

## 2014-08-30 ENCOUNTER — Encounter (HOSPITAL_COMMUNITY): Payer: Self-pay | Admitting: Cardiovascular Disease

## 2014-10-01 ENCOUNTER — Ambulatory Visit: Payer: Medicare Other | Admitting: Cardiology

## 2014-10-08 ENCOUNTER — Ambulatory Visit: Payer: Self-pay | Admitting: Cardiology

## 2014-10-23 ENCOUNTER — Ambulatory Visit (INDEPENDENT_AMBULATORY_CARE_PROVIDER_SITE_OTHER): Payer: Medicare Other | Admitting: Cardiology

## 2014-10-23 ENCOUNTER — Encounter: Payer: Self-pay | Admitting: Cardiology

## 2014-10-23 VITALS — BP 122/66 | HR 61 | Ht 64.0 in | Wt 132.0 lb

## 2014-10-23 DIAGNOSIS — I10 Essential (primary) hypertension: Secondary | ICD-10-CM

## 2014-10-23 DIAGNOSIS — Z954 Presence of other heart-valve replacement: Secondary | ICD-10-CM | POA: Diagnosis not present

## 2014-10-23 DIAGNOSIS — Z952 Presence of prosthetic heart valve: Secondary | ICD-10-CM

## 2014-10-23 DIAGNOSIS — R011 Cardiac murmur, unspecified: Secondary | ICD-10-CM

## 2014-10-23 DIAGNOSIS — I743 Embolism and thrombosis of arteries of the lower extremities: Secondary | ICD-10-CM | POA: Diagnosis not present

## 2014-10-23 DIAGNOSIS — I2510 Atherosclerotic heart disease of native coronary artery without angina pectoris: Secondary | ICD-10-CM

## 2014-10-23 DIAGNOSIS — E782 Mixed hyperlipidemia: Secondary | ICD-10-CM

## 2014-10-23 NOTE — Progress Notes (Signed)
Reason for visit: CAD, valvular heart disease, hypertension, hyperlipidemia  Clinical Summary [name] is a medically complex 65 y.o.male last seen in July 2015. He presents for a follow-up visit. There has been no significant change in functional status, he reports NYHA class II dyspnea, no chest pain or palpitations. We reviewed his medications, he continues on lisinopril, Lopressor, Maxide, and Savaysa. He brought all of his bottles in today.  Labwork from October 2015 showed fecal occult blood negative, cholesterol 108, triglycerides 93, HDL 44, LDL 45, potassium 4.4, BUN 13, creatinine 0.8, normal LFTs. He reports no side effects on Lipitor.  Most recent evaluation in November 2014 included cardiac catheterization revealing severe single-vessel disease involving the RCA, now status post BMS placement in the setting of NSTEMI, otherwise nonobstructive disease within the LAD and circumflex. Aortic root was described as being severely calcified. Echocardiogram reported mild LVH with LVEF 55-60%, bioprosthetic AVR with calcification of the noncoronary cusp, no stenosis, trivial aortic regurgitation, no perivalvular leak.  He does not report any fevers or chills, no rashes.  No Known Allergies  Current Outpatient Prescriptions  Medication Sig Dispense Refill  . atorvastatin (LIPITOR) 80 MG tablet Take 1 tablet (80 mg total) by mouth daily at 6 PM. 30 tablet 3  . Ferrous Sulfate (IRON) 325 (65 FE) MG TABS Take 1 tablet by mouth daily.      [name] lisinopril (PRINIVIL,ZESTRIL) 10 MG tablet Take 1 tablet (10 mg total) by mouth daily. 90 tablet 3  . metoprolol tartrate (LOPRESSOR) 25 MG tablet Take 1 tablet (25 mg total) by mouth 2 (two) times daily. 60 tablet 3  . nitroGLYCERIN (NITROSTAT) 0.4 MG SL tablet Place 1 tablet (0.4 mg total) under the tongue every 5 (five) minutes as needed for chest pain. 25 tablet 3  . SAVAYSA 60 MG TABS tablet TAKE 1 TABLET BY MOUTH AT 6PM 30 tablet 3  . SAVAYSA 60  MG TABS tablet TAKE 1 TABLET BY MOUTH AT 6PM 30 tablet 3  . triamterene-hydrochlorothiazide (MAXZIDE) 75-50 MG per tablet Take 1 tablet by mouth daily. 30 tablet 3   No current facility-administered medications for this visit.    Past Medical History  Diagnosis Date  . 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 Prosthetic valve endocarditis   . ETOH abuse     Has cut back significantly  . History of DVT (deep vein thrombosis) 2011  . S/P aortic valve replacement     Details not clear -  reportedly 1971 per patient  . Essential hypertension, benign   . History of GI bleed     February 2012   . CAD (coronary artery disease)     a. s/p PCI with 3.5 x 18 BMS to mid RCA, non obst LAD, Lcx patent 08/10/13  . Embolus of femoral artery 2012    Surgical repair, reason for lifelong coumadin  . Depression   . Headache(784.0)   . Arthritis     Past Surgical History  Procedure Laterality Date  . Aoric valve replacement  January 2005    26mm Medtronic Freestyle stentless porcine bioprosthesis [facility])  . Left leg embolectomy  May 2012    Dr. Trula Slade - reason for lifelong coumadin  . Cardiac catheterization      30-40% mid LAD, 20-30% ostial D1, proximal 30-40% RCA, 95% mid RCA thrombus, 40% distal stenosis. EF 45%, inferior HK.  . Coronary angioplasty with stent  placement  07/2013  . Colonoscopy N/A 04/20/2014    Procedure: COLONOSCOPY;  Surgeon: Rogene Houston, MD;  Location: [facility];  Service: Endoscopy;  Laterality: N/A;  930  . Left heart catheterization with coronary angiogram N/A 08/10/2013    Procedure: LEFT HEART CATHETERIZATION WITH CORONARY ANGIOGRAM;  Surgeon: Blane Ohara, MD;  Location: [facility] CATH LAB;  Service: Cardiovascular;  Laterality: N/A;    Family History  Problem Relation Age of Onset  . Diabetes Mother   . Cirrhosis Father   . COPD Sister     Social History [name] reports that  he quit smoking about 44 years ago. His smoking use included Cigarettes. He started smoking about 49 years ago. He smoked 0.50 packs per day. He has never used smokeless tobacco. [name] reports that he drinks about 6.0 oz of alcohol per week.  Review of Systems Complete review of systems negative except as otherwise outlined in the clinical summary and also the following. Stable appetite. No reported bleeding episodes, specifically no melena or hematochezia. No orthopnea or PND. Using a cane to walk. No falls.  Physical Examination Filed Vitals:   10/23/14 0847  BP: 122/66  Pulse: 61    Wt Readings from Last 3 Encounters:  10/23/14 132 lb (59.875 kg)  07/04/14 134 lb 6.4 oz (60.963 kg)  04/20/14 132 lb (59.875 kg)   Patient appears comfortable at rest.  HEENT: Conjunctiva and lids normal, disconjugate gaze, oropharynx clear with poor dentition.  Neck: Supple, elevated JVP or carotid bruits, no thyromegaly.  Lungs: Clear to auscultation, nonlabored breathing at rest.  Cardiac: Regular rate and rhythm, no S3, 3/6 diastolic murmur heard at the right base and apex, no pericardial rub.  Abdomen: Soft, nontender, bowel sounds present, no guarding or rebound.  Extremities: No pitting edema, distal pulses 2+.  Skin: Warm and dry.  Musculoskeletal: No kyphosis.  Neuropsychiatric: Alert and oriented x3, affect grossly appropriate.   Problem List and Plan   S/P aortic valve replacement History of bioprosthetic AVR, stable by echocardiogram in November 2014. He does have a more prominent diastolic murmur noted today. Follow-up echocardiogram will be obtained to reassess for any progressive aortic regurgitation or perivalvular leaks.   Essential hypertension, benign Blood pressure is well controlled today. No changes made in current antihypertensive doses.   Arterial embolus of lower extremity This has been his long-term indication for anticoagulation. He continues on  Savaysa. No spontaneous bleeding problems. Labwork from October reviewed.   Coronary atherosclerosis of native coronary artery History of BMS to the RCA, no active angina symptoms at present.   Mixed hyperlipidemia Continues on high-dose Lipitor, LDL aggressively controlled.     Satira Sark, M.D., F.A.C.C.

## 2014-10-23 NOTE — Assessment & Plan Note (Signed)
Continues on high-dose Lipitor, LDL aggressively controlled.

## 2014-10-23 NOTE — Patient Instructions (Signed)
Your physician wants you to follow-up in: 6 months with Dr.McDowell You will receive a reminder letter in the mail two months in advance. If you don't receive a letter, please call our office to schedule the follow-up appointment.   STOP Aspirin     Your physician has requested that you have an echocardiogram. Echocardiography is a painless test that uses sound waves to create images of your heart. It provides your doctor with information about the size and shape of your heart and how well your heart's chambers and valves are working. This procedure takes approximately one hour. There are no restrictions for this procedure.      Thank you for choosing [facility] !

## 2014-10-23 NOTE — Assessment & Plan Note (Signed)
History of bioprosthetic AVR, stable by echocardiogram in November 2014. He does have a more prominent diastolic murmur noted today. Follow-up echocardiogram will be obtained to reassess for any progressive aortic regurgitation or perivalvular leaks.

## 2014-10-23 NOTE — Assessment & Plan Note (Signed)
This has been his long-term indication for anticoagulation. He continues on Savaysa. No spontaneous bleeding problems. Labwork from October reviewed.

## 2014-10-23 NOTE — Assessment & Plan Note (Signed)
Blood pressure is well controlled today. No changes made in current antihypertensive doses.

## 2014-10-23 NOTE — Assessment & Plan Note (Signed)
History of BMS to the RCA, no active angina symptoms at present.

## 2014-11-23 ENCOUNTER — Other Ambulatory Visit: Payer: Self-pay | Admitting: Family Medicine

## 2014-11-23 ENCOUNTER — Other Ambulatory Visit: Payer: Self-pay | Admitting: Adult Health

## 2014-11-23 MED ORDER — NITROGLYCERIN 0.4 MG SL SUBL: 0.4000 mg | SUBLINGUAL_TABLET | SUBLINGUAL | Status: DC | PRN

## 2014-11-23 NOTE — Telephone Encounter (Signed)
escribed refill as requested

## 2014-11-23 NOTE — Telephone Encounter (Signed)
Received fax refill request  Rx # [id number] Medication:  Nitrostat 0.4 mg sub Qty 25 Sig:  Dissolve one tablet under the tongue every 5 minutes as needed for chest pain Do not exceed a total of 3 doses in 15 minutes Physician:  Purcell Nails

## 2014-12-12 ENCOUNTER — Encounter: Payer: Self-pay | Admitting: Family Medicine

## 2014-12-12 ENCOUNTER — Ambulatory Visit (INDEPENDENT_AMBULATORY_CARE_PROVIDER_SITE_OTHER): Payer: Medicare Other | Admitting: Family Medicine

## 2014-12-12 ENCOUNTER — Ambulatory Visit (HOSPITAL_COMMUNITY)
Admission: RE | Admit: 2014-12-12 | Discharge: 2014-12-12 | Disposition: A | Discharge status: Home / Self Care | Payer: Medicare Other | Source: Ambulatory Visit | Attending: Cardiology | Admitting: Cardiology

## 2014-12-12 VITALS — BP 120/70 | HR 57 | Resp 16 | Ht 64.0 in | Wt 131.0 lb

## 2014-12-12 DIAGNOSIS — I2510 Atherosclerotic heart disease of native coronary artery without angina pectoris: Secondary | ICD-10-CM

## 2014-12-12 DIAGNOSIS — F1010 Alcohol abuse, uncomplicated: Secondary | ICD-10-CM

## 2014-12-12 DIAGNOSIS — Z23 Encounter for immunization: Secondary | ICD-10-CM

## 2014-12-12 DIAGNOSIS — Z91048 Other nonmedicinal substance allergy status: Secondary | ICD-10-CM

## 2014-12-12 DIAGNOSIS — Z9109 Other allergy status, other than to drugs and biological substances: Secondary | ICD-10-CM

## 2014-12-12 DIAGNOSIS — R011 Cardiac murmur, unspecified: Secondary | ICD-10-CM

## 2014-12-12 DIAGNOSIS — Z952 Presence of prosthetic heart valve: Secondary | ICD-10-CM

## 2014-12-12 DIAGNOSIS — Z125 Encounter for screening for malignant neoplasm of prostate: Secondary | ICD-10-CM

## 2014-12-12 DIAGNOSIS — E782 Mixed hyperlipidemia: Secondary | ICD-10-CM

## 2014-12-12 DIAGNOSIS — I10 Essential (primary) hypertension: Secondary | ICD-10-CM

## 2014-12-12 MED ORDER — MOMETASONE FUROATE 50 MCG/ACT NA SUSP: 2.0000 | Freq: Every day | NASAL | Status: DC

## 2014-12-12 MED ORDER — TRIAMTERENE-HCTZ 75-50 MG PO TABS: 1.0000 | ORAL_TABLET | Freq: Every day | ORAL | Status: DC

## 2014-12-12 MED ORDER — ATORVASTATIN CALCIUM 80 MG PO TABS
ORAL_TABLET | ORAL | Status: DC
Start: 2014-12-12 — End: 2015-03-27

## 2014-12-12 MED ORDER — PREDNISONE 5 MG PO TABS: 5.0000 mg | ORAL_TABLET | Freq: Two times a day (BID) | ORAL | Status: AC

## 2014-12-12 MED ORDER — METOPROLOL TARTRATE 25 MG PO TABS: 25.0000 mg | ORAL_TABLET | Freq: Two times a day (BID) | ORAL | Status: DC

## 2014-12-12 NOTE — Patient Instructions (Signed)
Annual wellness first week in July, call if you need me before  Excellent blood pressure, great!  Prevnar today  New for allergies is nose spray and short course of prednisone  Pkls attend AA meeting as discussed your decision to stioop drinking alcohol is the best decision you  Can make for your health and help is available  CBC, fasting lipid, cmp and EGFr, TSH, pSA mid to end June

## 2014-12-12 NOTE — Progress Notes (Signed)
  Echocardiogram 2D Echocardiogram has been performed.  [name] 12/12/2014, 10:21 AM

## 2014-12-12 NOTE — Assessment & Plan Note (Addendum)
Hyperlipidemia:Low fat diet discussed and encouraged. Controlled, no change in medication   CMP Latest Ref Rng 07/04/2014 01/04/2014 11/23/2013  Glucose 70 - 99 mg/dL 88 99 85  BUN 6 - 23 mg/dL 13 12 23   Creatinine 0.50 - 1.35 mg/dL 0.87 1.01 1.04  Sodium 135 - 145 mEq/L 134(L) 132(L) 134(L)  Potassium 3.5 - 5.3 mEq/L 4.4 4.4 4.1  Chloride 96 - 112 mEq/L 97 96 102  CO2 19 - 32 mEq/L 29 26 24   Calcium 8.4 - 10.5 mg/dL 9.9 9.5 9.2  Total Protein 6.0 - 8.3 g/dL 7.9 - 7.7  Total Bilirubin 0.2 - 1.2 mg/dL 1.2 - 1.2  Alkaline Phos 39 - 117 U/L 71 - 69  AST 0 - 37 U/L 27 - 31  ALT 0 - 53 U/L 18 - 19    Lipid Panel     Component Value Date/Time   CHOL 108 07/04/2014 1503   TRIG 93 07/04/2014 1503   HDL 44 07/04/2014 1503   CHOLHDL 2.5 07/04/2014 1503   VLDL 19 07/04/2014 1503   LDLCALC 45 07/04/2014 1503      Updated lab needed at/ before next visit.

## 2014-12-12 NOTE — Assessment & Plan Note (Signed)
Controlled, no change in medication DASH diet and commitment to daily physical activity for a minimum of 30 minutes discussed and encouraged, as a part of hypertension management. The importance of attaining a healthy weight is also discussed.  BP/Weight 12/12/2014 10/23/2014 07/04/2014 04/20/2014 03/26/2014 12/25/6254 11/27/9371  Systolic BP 428 768 115 726 203 559 741  Diastolic BP 70 66 70 76 87 90 86  Wt. (Lbs) 131 132 134.4 132 131.2 132 131  BMI 22.47 22.65 23.06 22.65 22.51 22.65 22.47    CMP Latest Ref Rng 07/04/2014 01/04/2014 11/23/2013  Glucose 70 - 99 mg/dL 88 99 85  BUN 6 - 23 mg/dL 13 12 23   Creatinine 0.50 - 1.35 mg/dL 0.87 1.01 1.04  Sodium 135 - 145 mEq/L 134(L) 132(L) 134(L)  Potassium 3.5 - 5.3 mEq/L 4.4 4.4 4.1  Chloride 96 - 112 mEq/L 97 96 102  CO2 19 - 32 mEq/L 29 26 24   Calcium 8.4 - 10.5 mg/dL 9.9 9.5 9.2  Total Protein 6.0 - 8.3 g/dL 7.9 - 7.7  Total Bilirubin 0.2 - 1.2 mg/dL 1.2 - 1.2  Alkaline Phos 39 - 117 U/L 71 - 69  AST 0 - 37 U/L 27 - 31  ALT 0 - 53 U/L 18 - 19

## 2014-12-12 NOTE — Assessment & Plan Note (Signed)
After obtaining informed consent, the vaccine is  administered by LPN.  

## 2014-12-12 NOTE — Assessment & Plan Note (Signed)
Drinks 3 to 4 beers daily, states he wants to quit, knows AA grp and intends to join

## 2014-12-12 NOTE — Assessment & Plan Note (Signed)
Uncontrolled, start flonase and short course of prednisone

## 2014-12-25 ENCOUNTER — Other Ambulatory Visit: Payer: Self-pay | Admitting: Cardiology

## 2014-12-25 ENCOUNTER — Other Ambulatory Visit: Payer: Self-pay | Admitting: Family Medicine

## 2015-01-25 ENCOUNTER — Other Ambulatory Visit: Payer: Self-pay | Admitting: Cardiology

## 2015-02-28 NOTE — Progress Notes (Signed)
Subjective:    Patient ID: [name], male    DOB: June 05, 1951, 64 y.o.   MRN: [id number]  HPI   [name]     MRN: [id number]      DOB: 06/11/1951   HPI Mr. Glotfelty is here for follow up and re-evaluation of chronic medical conditions, medication management and review of any available recent lab and radiology data.  Preventive health is updated, specifically  Cancer screening and Immunization.   Questions or concerns regarding consultations or procedures which the PT has had in the interim are  addressed. The PT denies any adverse reactions to current medications since the last visit.  There are no new concerns.  There are no specific complaints   ROS Denies recent fever or chills. Denies sinus pressure, nasal congestion, ear pain or sore throat. Denies chest congestion, productive cough or wheezing. Denies chest pains, palpitations and leg swelling Denies abdominal pain, nausea, vomiting,diarrhea or constipation.   Denies dysuria, frequency, hesitancy or incontinence. Denies joint pain, swelling and limitation in mobility. Denies headaches, seizures, numbness, or tingling. Denies depression, anxiety or insomnia. Denies skin break down or rash.   PE  BP 120/70 mmHg  Pulse 57  Resp 16  Ht 5\' 4"  (1.626 m)  Wt 131 lb (59.421 kg)  BMI 22.47 kg/m2  SpO2 97%  Patient alert and oriented and in no cardiopulmonary distress.  HEENT: No facial asymmetry, EOMI,   oropharynx pink and moist.  Neck supple no JVD, no mass.  Chest: Clear to auscultation bilaterally.  CVS: S1, S2 systolic murmur and click, no S3.Regular rate.  ABD: Soft non tender.   Ext: No edema  MS: Adequate ROM spine, shoulders, hips and knees.  Skin: Intact, no ulcerations or rash noted.  Psych: Good eye contact, normal affect. Memory intact not anxious or depressed appearing.  CNS: CN 2-12 intact, power,  normal throughout.no focal deficits noted.   Assessment & Plan   Essential  hypertension, benign Controlled, no change in medication DASH diet and commitment to daily physical activity for a minimum of 30 minutes discussed and encouraged, as a part of hypertension management. The importance of attaining a healthy weight is also discussed.  BP/Weight 12/12/2014 10/23/2014 07/04/2014 04/20/2014 03/26/2014 11/27/1015 01/19/257  Systolic BP 527 782 423 536 144 315 400  Diastolic BP 70 66 70 76 87 90 86  Wt. (Lbs) 131 132 134.4 132 131.2 132 131  BMI 22.47 22.65 23.06 22.65 22.51 22.65 22.47    CMP Latest Ref Rng 07/04/2014 01/04/2014 11/23/2013  Glucose 70 - 99 mg/dL 88 99 85  BUN 6 - 23 mg/dL 13 12 23   Creatinine 0.50 - 1.35 mg/dL 0.87 1.01 1.04  Sodium 135 - 145 mEq/L 134(L) 132(L) 134(L)  Potassium 3.5 - 5.3 mEq/L 4.4 4.4 4.1  Chloride 96 - 112 mEq/L 97 96 102  CO2 19 - 32 mEq/L 29 26 24   Calcium 8.4 - 10.5 mg/dL 9.9 9.5 9.2  Total Protein 6.0 - 8.3 g/dL 7.9 - 7.7  Total Bilirubin 0.2 - 1.2 mg/dL 1.2 - 1.2  Alkaline Phos 39 - 117 U/L 71 - 69  AST 0 - 37 U/L 27 - 31  ALT 0 - 53 U/L 18 - 19       Alcohol abuse Drinks 3 to 4 beers daily, states he wants to quit, knows AA grp and intends to join  Mixed hyperlipidemia Hyperlipidemia:Low fat diet discussed and encouraged. Controlled, no change in medication   CMP Latest  Ref Rng 07/04/2014 01/04/2014 11/23/2013  Glucose 70 - 99 mg/dL 88 99 85  BUN 6 - 23 mg/dL 13 12 23   Creatinine 0.50 - 1.35 mg/dL 0.87 1.01 1.04  Sodium 135 - 145 mEq/L 134(L) 132(L) 134(L)  Potassium 3.5 - 5.3 mEq/L 4.4 4.4 4.1  Chloride 96 - 112 mEq/L 97 96 102  CO2 19 - 32 mEq/L 29 26 24   Calcium 8.4 - 10.5 mg/dL 9.9 9.5 9.2  Total Protein 6.0 - 8.3 g/dL 7.9 - 7.7  Total Bilirubin 0.2 - 1.2 mg/dL 1.2 - 1.2  Alkaline Phos 39 - 117 U/L 71 - 69  AST 0 - 37 U/L 27 - 31  ALT 0 - 53 U/L 18 - 19    Lipid Panel     Component Value Date/Time   CHOL 108 07/04/2014 1503   TRIG 93 07/04/2014 1503   HDL 44 07/04/2014 1503   CHOLHDL 2.5 07/04/2014  1503   VLDL 19 07/04/2014 1503   LDLCALC 45 07/04/2014 1503      Updated lab needed at/ before next visit.   Environmental allergies Uncontrolled, start flonase and short course of prednisone  Need for vaccination with 13-polyvalent pneumococcal conjugate vaccine After obtaining informed consent, the vaccine is  administered by LPN.   Coronary atherosclerosis of native coronary artery Asymptomatic, denies angina or anginal equivalent , denies excessive fatigue       Review of Systems     Objective:   Physical Exam        Assessment & Plan:

## 2015-02-28 NOTE — Assessment & Plan Note (Signed)
Asymptomatic, denies angina or anginal equivalent , denies excessive fatigue

## 2015-03-18 ENCOUNTER — Other Ambulatory Visit: Payer: Self-pay | Admitting: Family Medicine

## 2015-03-18 DIAGNOSIS — I10 Essential (primary) hypertension: Secondary | ICD-10-CM | POA: Diagnosis not present

## 2015-03-18 DIAGNOSIS — Z125 Encounter for screening for malignant neoplasm of prostate: Secondary | ICD-10-CM | POA: Diagnosis not present

## 2015-03-18 DIAGNOSIS — D539 Nutritional anemia, unspecified: Secondary | ICD-10-CM | POA: Diagnosis not present

## 2015-03-18 DIAGNOSIS — E782 Mixed hyperlipidemia: Secondary | ICD-10-CM | POA: Diagnosis not present

## 2015-03-19 LAB — COMPLETE METABOLIC PANEL WITH GFR
ALBUMIN: 4.5 g/dL (ref 3.5–5.2)
ALT: 23 U/L (ref 0–53)
AST: 23 U/L (ref 0–37)
Alkaline Phosphatase: 80 U/L (ref 39–117)
BUN: 12 mg/dL (ref 6–23)
CALCIUM: 9.4 mg/dL (ref 8.4–10.5)
CHLORIDE: 91 meq/L — AB (ref 96–112)
CO2: 25 mEq/L (ref 19–32)
Creat: 0.99 mg/dL (ref 0.50–1.35)
GFR, Est African American: >89 mL/min
GFR, Est Non African American: 81 mL/min
GLUCOSE: 84 mg/dL (ref 70–99)
POTASSIUM: 4.1 meq/L (ref 3.5–5.3)
SODIUM: 129 meq/L — AB (ref 135–145)
TOTAL PROTEIN: 7.8 g/dL (ref 6.0–8.3)
Total Bilirubin: 1.2 mg/dL (ref 0.2–1.2)

## 2015-03-19 LAB — FERRITIN: FERRITIN: 228 ng/mL (ref 22–322)

## 2015-03-19 LAB — CBC WITH DIFFERENTIAL/PLATELET
Basophils Absolute: 0 10*3/uL (ref 0.0–0.1)
Basophils Relative: 0 % (ref 0–1)
EOS PCT: 8 % — AB (ref 0–5)
Eosinophils Absolute: 0.8 10*3/uL — ABNORMAL HIGH (ref 0.0–0.7)
HCT: 36.7 % — ABNORMAL LOW (ref 39.0–52.0)
Hemoglobin: 12.2 g/dL — ABNORMAL LOW (ref 13.0–17.0)
LYMPHS ABS: 2.2 10*3/uL (ref 0.7–4.0)
LYMPHS PCT: 23 % (ref 12–46)
MCH: 29 pg (ref 26.0–34.0)
MCHC: 33.2 g/dL (ref 30.0–36.0)
MCV: 87.2 fL (ref 78.0–100.0)
MONO ABS: 1 10*3/uL (ref 0.1–1.0)
MONOS PCT: 10 % (ref 3–12)
MPV: 8.4 fL — ABNORMAL LOW (ref 8.6–12.4)
Neutro Abs: 5.7 10*3/uL (ref 1.7–7.7)
Neutrophils Relative %: 59 % (ref 43–77)
PLATELETS: 308 10*3/uL (ref 150–400)
RBC: 4.21 MIL/uL — AB (ref 4.22–5.81)
RDW: 13.3 % (ref 11.5–15.5)
WBC: 9.7 10*3/uL (ref 4.0–10.5)

## 2015-03-19 LAB — LIPID PANEL
Cholesterol: 73 mg/dL (ref 0–200)
HDL: 28 mg/dL — ABNORMAL LOW (ref >=40)
LDL CALC: 32 mg/dL (ref 0–99)
Total CHOL/HDL Ratio: 2.6 Ratio
Triglycerides: 64 mg/dL (ref <150)
VLDL: 13 mg/dL (ref 0–40)

## 2015-03-19 LAB — IRON: Iron: 73 ug/dL (ref 42–165)

## 2015-03-19 LAB — PSA, MEDICARE: PSA: 3.1 ng/mL (ref <=4.00)

## 2015-03-19 LAB — TSH: TSH: 0.818 u[IU]/mL (ref 0.350–4.500)

## 2015-03-23 ENCOUNTER — Other Ambulatory Visit: Payer: Self-pay | Admitting: Family Medicine

## 2015-03-27 ENCOUNTER — Encounter: Payer: Self-pay | Admitting: Family Medicine

## 2015-03-27 ENCOUNTER — Ambulatory Visit (INDEPENDENT_AMBULATORY_CARE_PROVIDER_SITE_OTHER): Payer: Medicare Other | Admitting: Family Medicine

## 2015-03-27 VITALS — BP 132/76 | HR 78 | Resp 18 | Ht 64.0 in | Wt 130.0 lb

## 2015-03-27 DIAGNOSIS — I10 Essential (primary) hypertension: Secondary | ICD-10-CM

## 2015-03-27 DIAGNOSIS — Z0000 Encounter for general adult medical examination without abnormal findings: Secondary | ICD-10-CM | POA: Diagnosis not present

## 2015-03-27 DIAGNOSIS — Z91048 Other nonmedicinal substance allergy status: Secondary | ICD-10-CM

## 2015-03-27 DIAGNOSIS — F1010 Alcohol abuse, uncomplicated: Secondary | ICD-10-CM

## 2015-03-27 DIAGNOSIS — Z86718 Personal history of other venous thrombosis and embolism: Secondary | ICD-10-CM

## 2015-03-27 DIAGNOSIS — Z9109 Other allergy status, other than to drugs and biological substances: Secondary | ICD-10-CM

## 2015-03-27 DIAGNOSIS — E782 Mixed hyperlipidemia: Secondary | ICD-10-CM

## 2015-03-27 DIAGNOSIS — I700 Atherosclerosis of aorta: Secondary | ICD-10-CM

## 2015-03-27 DIAGNOSIS — N529 Male erectile dysfunction, unspecified: Secondary | ICD-10-CM | POA: Insufficient documentation

## 2015-03-27 DIAGNOSIS — R059 Cough, unspecified: Secondary | ICD-10-CM | POA: Insufficient documentation

## 2015-03-27 DIAGNOSIS — Z87891 Personal history of nicotine dependence: Secondary | ICD-10-CM

## 2015-03-27 DIAGNOSIS — R05 Cough: Secondary | ICD-10-CM

## 2015-03-27 DIAGNOSIS — N528 Other male erectile dysfunction: Secondary | ICD-10-CM

## 2015-03-27 HISTORY — DX: Male erectile dysfunction, unspecified: N52.9

## 2015-03-27 MED ORDER — MONTELUKAST SODIUM 10 MG PO TABS: 10.0000 mg | ORAL_TABLET | Freq: Every day | ORAL | Status: DC

## 2015-03-27 NOTE — Assessment & Plan Note (Signed)
Reports quitting in 12/2014 , he is applauded on this and encouraged to remain alcohol free

## 2015-03-27 NOTE — Assessment & Plan Note (Signed)

## 2015-03-27 NOTE — Assessment & Plan Note (Signed)
Denies chest pain, does have exertional fatigue, has August f/u to be scheduled with cardiology, will also refer for PFT, duue to h/o nicotine use an excessive cough

## 2015-03-27 NOTE — Assessment & Plan Note (Signed)
Uncontrolled, needs nasonex re sent , states he never got this , will also add singulair and order PFT due to excess cough, dyspne and h/o nicotine use

## 2015-03-27 NOTE — Assessment & Plan Note (Signed)
Remains on lifelong c anti coagullation with no complications

## 2015-03-27 NOTE — Patient Instructions (Addendum)
F/u in 4 month, call if you need me before  Congrats on quitting alcohol  You are referred for lung function test  Nasonex is re sent also singulair  You will get tele number to call for eye exam  You will get appt info for cardiology follow up  Call for urology referral for Ed if you decide on this please  You are encouraged to work on a living will

## 2015-03-27 NOTE — Assessment & Plan Note (Signed)
Controlled, no change in medication  

## 2015-03-27 NOTE — Assessment & Plan Note (Signed)
Currently symptomatic , but no decision yet to see urology for a pump, will call back if he decides on this, due to cardiac disease a poor candidate for oral medication in my iopinion

## 2015-03-27 NOTE — Assessment & Plan Note (Signed)
Low HDL, states ability to exercise is limited by pain in left foot No med change at this time

## 2015-03-27 NOTE — Progress Notes (Signed)
Subjective:    Patient ID: [name], male    DOB: Feb 03, 1951, 64 y.o.   MRN: [id number]  HPI Preventive Screening-Counseling & Management   Patient present here today for a Medicare annual wellness visit.   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 No  Over the past two weeks, have you felt little interest or pleasure in doing things? No  Have you lost interest or pleasure in daily life? No  Do you often feel hopeless? No  Do you cry easily over simple problems? No   Activities of Daily Living  In your present state of health, do you have any difficulty performing the following activities?  Driving?: Yes no current license  Managing money?: No Feeding yourself?:No Getting from bed to chair?:No Climbing a flight of stairs?:No Preparing food and eating?:No Bathing or showering?:No Getting dressed?:No Getting to the toilet?:No Using the toilet?:No Moving around from place to place?: No  Fall Risk Assessment In the past year have you fallen or had a near fall?:No Are you currently taking any medications that make you dizziness?:No   Hearing Difficulties: No Do you often ask people to speak up or repeat themselves?:No Do you experience ringing or noises in your ears?:No Do you have difficulty understanding soft or whispered voices?:No  Cognitive Testing  Alert? Yes Normal Appearance?Yes  Oriented to person? Yes Place? Yes  Time? Yes  Displays appropriate judgment?Yes  Can read the  correct time from a watch face? yes Are you having problems remembering things?No  Advanced Directives have been discussed with the patient?Yes and brochure given   List the Names of Other Physician/Practitioners you currently use: care teams updated    Indicate any recent Medical Services you may have received from other than [facility] providers in the past year (date may be approximate).   Assessment:    Annual Wellness Exam   Plan:    Medicare Attestation  I have personally reviewed:  The patient's medical and social history  Their use of alcohol, tobacco or illicit drugs  Their current medications and supplements  The patient's functional ability including ADLs,fall risks, home safety risks, cognitive, and hearing and visual impairment  Diet and physical activities  Evidence for depression or mood disorders  The patient's weight, height, BMI, and visual acuity have been recorded in the chart. I have made referrals, counseling, and provided education to the patient based on review of the above and I have provided the patient with a written personalized care plan for preventive services.      Review of Systems     Objective:   Physical Exam BP 132/76 mmHg  Pulse 78  Resp 18  Ht 5\' 4"  (1.626 m)  Wt 130 lb 0.6 oz (58.986 kg)  BMI 22.31 kg/m2  SpO2 98%        Assessment & Plan:  Medicare annual wellness visit, subsequent Annual exam as documented. Counseling done  re healthy lifestyle involving commitment to 150 minutes exercise per week, heart healthy diet,  and attaining healthy weight.The importance of adequate sleep also discussed. Regular seat belt use and home safety, is also discussed. Changes in health habits are decided on by the patient with goals and time frames  set for achieving them. Immunization and cancer screening needs are specifically addressed at this visit.   Alcohol abuse Reports quitting in 12/2014 , he is applauded on this and encouraged to  remain alcohol free  History of DVT (deep vein thrombosis) Remains on lifelong c anti coagullation with no complications  Aortic atherosclerosis Denies chest pain, does have exertional fatigue, has August f/u to be scheduled with cardiology, will also refer for PFT, duue to h/o nicotine use an excessive cough  Essential hypertension, benign Controlled, no change in medication   Mixed hyperlipidemia Low HDL, states ability to exercise is limited by pain in left foot No med change at this time  Environmental allergies Uncontrolled, needs nasonex re sent , states he never got this , will also add singulair and order PFT due to excess cough, dyspne and h/o nicotine use   Erectile dysfunction Currently symptomatic , but no decision yet to see urology for a pump, will call back if he decides on this, due to cardiac disease a poor candidate for oral medication in my iopinion

## 2015-04-05 ENCOUNTER — Ambulatory Visit (HOSPITAL_COMMUNITY)
Admission: RE | Admit: 2015-04-05 | Discharge: 2015-04-05 | Disposition: A | Discharge status: Home / Self Care | Payer: Medicare Other | Source: Ambulatory Visit | Attending: Family Medicine | Admitting: Family Medicine

## 2015-04-05 DIAGNOSIS — R059 Cough, unspecified: Secondary | ICD-10-CM

## 2015-04-05 DIAGNOSIS — Z87891 Personal history of nicotine dependence: Secondary | ICD-10-CM | POA: Diagnosis not present

## 2015-04-05 DIAGNOSIS — R05 Cough: Secondary | ICD-10-CM | POA: Diagnosis not present

## 2015-04-05 LAB — PULMONARY FUNCTION TEST
DL/VA % pred: 67 %
DL/VA: 2.84 ml/min/mmHg/L
DLCO UNC: 20.2 ml/min/mmHg
DLCO unc % pred: 83 %
FEF 25-75 PRE: 0.81 L/s
FEF 25-75 Post: 1.59 L/sec
FEF2575-%Change-Post: 97 %
FEF2575-%Pred-Post: 71 %
FEF2575-%Pred-Pre: 36 %
FEV1-%CHANGE-POST: 16 %
FEV1-%PRED-POST: 90 %
FEV1-%Pred-Pre: 77 %
FEV1-POST: 2.12 L
FEV1-Pre: 1.82 L
FEV1FVC-%CHANGE-POST: 11 %
FEV1FVC-%Pred-Pre: 81 %
FEV6-%CHANGE-POST: 7 %
FEV6-%Pred-Post: 101 %
FEV6-%Pred-Pre: 93 %
FEV6-PRE: 2.76 L
FEV6-Post: 2.98 L
FEV6FVC-%CHANGE-POST: 3 %
FEV6FVC-%PRED-PRE: 101 %
FEV6FVC-%Pred-Post: 104 %
FVC-%Change-Post: 4 %
FVC-%PRED-PRE: 92 %
FVC-%Pred-Post: 96 %
FVC-POST: 3 L
FVC-Pre: 2.87 L
PRE FEV1/FVC RATIO: 63 %
PRE FEV6/FVC RATIO: 96 %
Post FEV1/FVC ratio: 71 %
Post FEV6/FVC ratio: 99 %
RV % pred: 164 %
RV: 3.27 L
TLC % PRED: 104 %
TLC: 6.03 L

## 2015-04-05 MED ORDER — ALBUTEROL SULFATE (2.5 MG/3ML) 0.083% IN NEBU
2.5000 mg | INHALATION_SOLUTION | Freq: Once | RESPIRATORY_TRACT | Status: AC
  Administered 2015-04-05: 2.5 mg via RESPIRATORY_TRACT

## 2015-04-07 ENCOUNTER — Other Ambulatory Visit: Payer: Self-pay | Admitting: Family Medicine

## 2015-04-08 ENCOUNTER — Other Ambulatory Visit: Payer: Self-pay

## 2015-04-08 MED ORDER — BUDESONIDE-FORMOTEROL FUMARATE 80-4.5 MCG/ACT IN AERO: 2.0000 | INHALATION_SPRAY | Freq: Two times a day (BID) | RESPIRATORY_TRACT | Status: DC

## 2015-05-01 ENCOUNTER — Telehealth: Payer: Self-pay | Admitting: Family Medicine

## 2015-05-01 NOTE — Telephone Encounter (Signed)
Called and advised patient that he will need to have cardiology to take care of this medication for him

## 2015-05-01 NOTE — Telephone Encounter (Signed)
Patient is having a problem getting medication SAVAYSA 60 MG TABS tablet filled at the pharmacy, patient states that he only has 1 pill left, please advise?

## 2015-05-02 ENCOUNTER — Other Ambulatory Visit: Payer: Self-pay | Admitting: Cardiology

## 2015-05-02 MED ORDER — EDOXABAN TOSYLATE 60 MG PO TABS: ORAL_TABLET | ORAL | Status: DC

## 2015-05-02 NOTE — Telephone Encounter (Signed)
Refill savaysa

## 2015-05-02 NOTE — Telephone Encounter (Signed)
Pt says he was told savaysa would be 40/montly and he could not afford that. Spoke with pharmacy said they did not have active rx to run to see how much med would cost. Sent rx to [facility] per pt request and they stated they would let pt know how much savaysa would cost. Pt notified and will call back with any further issues. $4 per month coupon is not valid with pt having medicare. Neither [place] or [facility] have samples.

## 2015-05-02 NOTE — Telephone Encounter (Signed)
Patient would like to speak with nurse regarding not being able to get his Savaysa. / tg

## 2015-05-06 ENCOUNTER — Telehealth: Payer: Self-pay | Admitting: Cardiology

## 2015-05-06 NOTE — Telephone Encounter (Signed)
Discount card sent for [facility] mailed to patient

## 2015-05-06 NOTE — Telephone Encounter (Signed)
Patient would like to speak with nurse regarding medications. / tg  °

## 2015-05-29 ENCOUNTER — Ambulatory Visit (INDEPENDENT_AMBULATORY_CARE_PROVIDER_SITE_OTHER): Payer: Medicare Other | Admitting: Cardiology

## 2015-05-29 ENCOUNTER — Encounter: Payer: Self-pay | Admitting: Cardiology

## 2015-05-29 VITALS — BP 128/72 | HR 61 | Ht 64.0 in | Wt 126.8 lb

## 2015-05-29 DIAGNOSIS — I743 Embolism and thrombosis of arteries of the lower extremities: Secondary | ICD-10-CM

## 2015-05-29 DIAGNOSIS — E782 Mixed hyperlipidemia: Secondary | ICD-10-CM

## 2015-05-29 DIAGNOSIS — I2510 Atherosclerotic heart disease of native coronary artery without angina pectoris: Secondary | ICD-10-CM

## 2015-05-29 DIAGNOSIS — Z954 Presence of other heart-valve replacement: Secondary | ICD-10-CM | POA: Diagnosis not present

## 2015-05-29 DIAGNOSIS — I10 Essential (primary) hypertension: Secondary | ICD-10-CM

## 2015-05-29 DIAGNOSIS — Z952 Presence of prosthetic heart valve: Secondary | ICD-10-CM

## 2015-05-29 MED ORDER — ATORVASTATIN CALCIUM 40 MG PO TABS
40.0000 mg | ORAL_TABLET | Freq: Every day | ORAL | Status: DC
Start: 2015-05-29 — End: 2016-01-07

## 2015-05-29 NOTE — Patient Instructions (Signed)
Your physician wants you to follow-up in: 6 months Dr Ferne Reus will receive a reminder letter in the mail two months in advance. If you don't receive a letter, please call our office to schedule the follow-up appointment.    DECREASE Atorvastatin (Lipitor ) to 40 mg daily    Thank you for choosing [facility] !

## 2015-05-29 NOTE — Progress Notes (Signed)
Cardiology Office Note  Date: 05/29/2015   ID: Mikias, Lanz [birth year], MRN [id number]  PCP: Tula Nakayama, MD  Primary Cardiologist: Rozann Lesches, MD   Chief Complaint  Patient presents with  . Coronary Artery Disease  . Valvular heart disease    History of Present Illness: [name] is a medically complex 64 y.o. male last seen in February. He presents for a routine follow-up visit. States that he has been feeling well, has had no interval hospitalizations. Follows with Dr. Moshe Cipro for primary care. I reviewed his recent lab work as outlined below.  We discussed his medications. He states that he has been out of Savaysa for a few weeks, states that the monthly cost went up substantially and he has not been able to afford it. We are trying to get some samples for him and also get him applied for an assistance program.  Most recent evaluation in November 2014 included cardiac catheterization revealing severe single-vessel disease involving the RCA, now status post BMS placement in the setting of NSTEMI, otherwise nonobstructive disease within the LAD and circumflex. Aortic root was described as being severely calcified. Echocardiogram from March of this year is outlined below.  Past Medical History  Diagnosis Date  . 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 Prosthetic valve endocarditis   . ETOH abuse     Has cut back significantly  . History of DVT (deep vein thrombosis) 2011  . S/P aortic valve replacement     Details not clear -  reportedly 1971 per patient  . Essential hypertension, benign   . History of GI bleed     February 2012   . CAD (coronary artery disease)     a. s/p PCI with 3.5 x 18 BMS to mid RCA, non obst LAD, Lcx patent 08/10/13  . Embolus of femoral artery 2012    Surgical repair, reason for lifelong coumadin  . Depression   . Headache(784.0)   . Arthritis   .  Loss of vision 1963    left    Past Surgical History  Procedure Laterality Date  . Aoric valve replacement  January 2005    39mm Medtronic Freestyle stentless porcine bioprosthesis [facility])  . Left leg embolectomy  May 2012    Dr. Trula Slade - reason for lifelong coumadin  . Cardiac catheterization      30-40% mid LAD, 20-30% ostial D1, proximal 30-40% RCA, 95% mid RCA thrombus, 40% distal stenosis. EF 45%, inferior HK.  . Coronary angioplasty with stent placement  07/2013  . Colonoscopy N/A 04/20/2014    Procedure: COLONOSCOPY;  Surgeon: Rogene Houston, MD;  Location: [facility];  Service: Endoscopy;  Laterality: N/A;  930  . Left heart catheterization with coronary angiogram N/A 08/10/2013    Procedure: LEFT HEART CATHETERIZATION WITH CORONARY ANGIOGRAM;  Surgeon: Blane Ohara, MD;  Location: [facility] CATH LAB;  Service: Cardiovascular;  Laterality: N/A;    Current Outpatient Prescriptions  Medication Sig Dispense Refill  . budesonide-formoterol (SYMBICORT) 80-4.5 MCG/ACT inhaler Inhale 2 puffs into the lungs 2 (two) times daily. 1 Inhaler 3  . edoxaban (SAVAYSA) 60 MG TABS tablet TAKE 1 TABLET BY MOUTH AT 6PM 30 tablet 1  . Ferrous Sulfate (IRON) 325 (65 FE) MG TABS Take 1 tablet by mouth daily.      [name] lisinopril (PRINIVIL,ZESTRIL) 10 MG tablet Take 1 tablet (10 mg total) by  mouth daily. 90 tablet 3  . metoprolol tartrate (LOPRESSOR) 25 MG tablet TAKE ONE TABLET BY MOUTH TWICE DAILY 60 tablet 3  . mometasone (NASONEX) 50 MCG/ACT nasal spray Place 2 sprays into the nose daily. 17 g 12  . montelukast (SINGULAIR) 10 MG tablet Take 1 tablet (10 mg total) by mouth at bedtime. 30 tablet 3  . nitroGLYCERIN (NITROSTAT) 0.4 MG SL tablet Place 1 tablet (0.4 mg total) under the tongue every 5 (five) minutes as needed for chest pain. 25 tablet 3  . triamterene-hydrochlorothiazide (MAXZIDE) 75-50 MG per tablet TAKE ONE TABLET BY MOUTH ONCE DAILY 30 tablet 3   No current facility-administered  medications for this visit.    Allergies:  Review of patient's allergies indicates no known allergies.   Social History: The patient  reports that he quit smoking about 45 years ago. His smoking use included Cigarettes. He started smoking about 49 years ago. He smoked 0.50 packs per day. He has never used smokeless tobacco. He reports that he does not drink alcohol or use illicit drugs.   ROS:  Please see the history of present illness. Otherwise, complete review of systems is positive for none.  All other systems are reviewed and negative.   Physical Exam: VS:  BP 128/72 mmHg  Pulse 61  Ht 5\' 4"  (1.626 m)  Wt 126 lb 12.8 oz (57.516 kg)  BMI 21.75 kg/m2  SpO2 99%, BMI Body mass index is 21.75 kg/(m^2).  Wt Readings from Last 3 Encounters:  05/29/15 126 lb 12.8 oz (57.516 kg)  03/27/15 130 lb 0.6 oz (58.986 kg)  12/12/14 131 lb (59.421 kg)     Patient appears comfortable at rest.  HEENT: Conjunctiva and lids normal, disconjugate gaze, oropharynx clear with poor dentition.  Neck: Supple, elevated JVP or carotid bruits, no thyromegaly.  Lungs: Clear to auscultation, nonlabored breathing at rest.  Cardiac: Regular rate and rhythm, no S3, 2/6 diastolic murmur heard at the right base and apex, no pericardial rub.  Abdomen: Soft, nontender, bowel sounds present, no guarding or rebound.  Extremities: No pitting edema, distal pulses 2+.  Skin: Warm and dry.  Musculoskeletal: No kyphosis.  Neuropsychiatric: Alert and oriented x3, affect grossly appropriate.   ECG: ECG is not ordered today.  Recent Labwork: 03/18/2015: ALT 23; AST 23; BUN 12; Creat 0.99; Hemoglobin 12.2*; Platelets 308; Potassium 4.1; Sodium 129*; TSH 0.818     Component Value Date/Time   CHOL 73 03/18/2015 0734   TRIG 64 03/18/2015 0734   HDL 28* 03/18/2015 0734   CHOLHDL 2.6 03/18/2015 0734   VLDL 13 03/18/2015 0734   LDLCALC 32 03/18/2015 0734    Other Studies Reviewed Today:  Echocardiogram  12/12/2014: Study Conclusions  - Left ventricle: The cavity size was normal. There was mild concentric hypertrophy. Systolic function was normal. The estimated ejection fraction was in the range of 55% to 60%. Wall motion was normal; there were no regional wall motion abnormalities. - Aortic valve: Normally functioning bioprosthetic aortic valve is seen. There was no stenosis. There was trivial regurgitation. - Mitral valve: There was mild regurgitation. - Left atrium: The atrium was severely dilated. Volume/bsa, ES, (1-plane Simpson&'s, A2C): 49.4 ml/m^2. - Right ventricle: Systolic function was mildly reduced. - Right atrium: The atrium was mildly dilated.   ASSESSMENT AND PLAN:  1. History of bioprosthetic AVR, stable by follow-up echocardiogram in March of this year.  2. History of lower extremity arterial embolus and also DVT, has been chronically anticoagulated, most recently on  Savaysa. As noted he has not been able to take this medication due to a substantial increase in monthly cost. We will look into samples and also an assistance program. Otherwise may have to switch to a different agent.  3. CAD status post BMS to the RCA as discussed above, no active angina symptoms.  4. Essential hypertension, blood pressure control is reasonable.  5. Hyperlipidemia, lipid numbers are quite low with LDL of 32. Decrease Lipitor to 40 mg daily.  Current medicines were reviewed at length with the patient today.   Orders Placed This Encounter  Procedures  . EKG 12-Lead    Disposition: FU with me in 6 months.   Signed, Satira Sark, MD, [facility] 05/29/2015 10:00 AM    [place] [facility] [address] [place], [place] [address] Phone: [phone]; Fax: [phone]

## 2015-06-03 ENCOUNTER — Telehealth: Payer: Self-pay | Admitting: Cardiology

## 2015-06-03 MED ORDER — EDOXABAN TOSYLATE 60 MG PO TABS: ORAL_TABLET | ORAL | Status: DC

## 2015-06-03 NOTE — Telephone Encounter (Signed)
Patient has questions about his Savaysa. / tg

## 2015-06-03 NOTE — Telephone Encounter (Signed)
Patient called to follow up on getting help with savaysa since it is costing him $80 per month and he can't afford it. Patient advised that message would be sent to Dr. Myles Gip nurse in regards to the patient assistance since that is what was requested at his last office visit. Patient was given a months worth of samples.

## 2015-06-18 ENCOUNTER — Telehealth: Payer: Self-pay

## 2015-06-18 NOTE — Telephone Encounter (Signed)
Pharmacist Barnetta Chapel informed me pt has to pay $80 for savaysaas he has [employer] and is not eligible for assistance      Will FYI to MD as pt states he cannot pay for alternative medication

## 2015-06-18 NOTE — Telephone Encounter (Signed)
Noted. Thank you for checking on this. Could you please then work with [name] in the anticoagulation clinic to determine the most affordable anticoagulant for [name] to take.

## 2015-06-18 NOTE — Telephone Encounter (Signed)
I have messaged Edrick Oh, Coumadin Nurse to enroll patient in her clinic

## 2015-06-18 NOTE — Telephone Encounter (Signed)
[facility] discount card to be mailed to pt per Georgeann Oppenheim at [facility] should pay $4 per month for 12 months  Card ID number [id number],    Group [id number],  BIN: [id number], PCN: [id number]   I spoke with pharmacist Barnetta Chapel and she states she will call me back   Pt's phone rings busy,unable to reach him,work number not good

## 2015-06-19 ENCOUNTER — Encounter: Payer: Self-pay | Admitting: *Deleted

## 2015-06-19 NOTE — Telephone Encounter (Signed)
Unable to reach pt by phone after multiple attempts.  Letter mailed to pt to call and make an appt to get started on coumadin since he can not afford Savaysa.

## 2015-07-30 ENCOUNTER — Encounter: Payer: Self-pay | Admitting: Family Medicine

## 2015-07-30 ENCOUNTER — Ambulatory Visit (INDEPENDENT_AMBULATORY_CARE_PROVIDER_SITE_OTHER): Payer: Medicare Other | Admitting: Family Medicine

## 2015-07-30 VITALS — BP 140/82 | HR 55 | Resp 16 | Ht 64.0 in | Wt 127.4 lb

## 2015-07-30 DIAGNOSIS — Z23 Encounter for immunization: Secondary | ICD-10-CM

## 2015-07-30 DIAGNOSIS — Z1211 Encounter for screening for malignant neoplasm of colon: Secondary | ICD-10-CM | POA: Diagnosis not present

## 2015-07-30 DIAGNOSIS — Z91048 Other nonmedicinal substance allergy status: Secondary | ICD-10-CM

## 2015-07-30 DIAGNOSIS — Z0000 Encounter for general adult medical examination without abnormal findings: Secondary | ICD-10-CM | POA: Diagnosis not present

## 2015-07-30 DIAGNOSIS — Z9109 Other allergy status, other than to drugs and biological substances: Secondary | ICD-10-CM

## 2015-07-30 LAB — POC HEMOCCULT BLD/STL (OFFICE/1-CARD/DIAGNOSTIC): FECAL OCCULT BLD: NEGATIVE

## 2015-07-30 MED ORDER — MOMETASONE FUROATE 50 MCG/ACT NA SUSP: 2.0000 | Freq: Every day | NASAL | Status: DC

## 2015-07-30 NOTE — Assessment & Plan Note (Signed)
After obtaining informed consent, the vaccine is  administered by LPN.  

## 2015-07-30 NOTE — Progress Notes (Signed)
   Subjective:    Patient ID: [name], male    DOB: [birth year], 64 y.o.   MRN: [id number]  HPI Patient is in for annual physical exam. C/o runny nose  And sneezing, has not picked up allergy med which has been repeatedly prescribed in the past C/o no blood thinner x over 1 month. Further follow up after visit reveals pt should have changed to coumadin, but apparently he has difficulty understanding and following through with treatment plans. Will continue to work with cardiology, as able to have this situation rectified Recent labs, if available are reviewed. Immunization is reviewed , influenza vaccin administered    Review of Systems See HPI     Objective:   Physical Exam  BP 140/82 mmHg  Pulse 55  Resp 16  Ht 5\' 4"  (1.626 m)  Wt 127 lb 6.4 oz (57.788 kg)  BMI 21.86 kg/m2  SpO2 100%  Pleasant well nourished male, alert and oriented x 3, in no cardio-pulmonary distress. Afebrile. HEENT No facial trauma or asymetry. Sinuses non tender. EOMI, markedly  reduced left eye vision External ears normal, tympanic membranes clear. Oropharynx moist, no exudate, poor dentition. Neck: supple, no adenopathy,JVD or thyromegaly.No bruits.  Chest: Clear to ascultation bilaterally.No crackles or wheezes. Non tender to palpation  Breast: No asymetry,no masses. No nipple discharge or inversion. No axillary or supraclavicular adenopathy  Cardiovascular system; Heart sounds normal,  S1 and  S2 ,no S3.  Diastolic murmur Apical beat not displaced Peripheral pulses normal.  Abdomen: Soft, non tender, no organomegaly or masses. No bruits. Bowel sounds normal. No guarding, tenderness or rebound.  Rectal:  Normal sphincter tone. No hemorrhoids or  masses. guaiac negative stool. Prostate smooth and firm  GU: Not examined   Musculoskeletal exam: Full ROM of spine, hips , shoulders and knees. No deformity ,swelling or crepitus noted. No muscle wasting or atrophy.    Neurologic: Cranial nerves 2 to 12 intact. Power, tone ,sensation and reflexes normal throughout. No disturbance in gait. No tremor.  Skin: Intact, no ulceration, erythema , scaling or rash noted. Pigmentation normal throughout  Psych; Normal mood and affect. Judgement and concentration normal        Assessment & Plan:  Annual physical exam Annual exam as documented. Counseling done  re healthy lifestyle involving commitment to 150 minutes exercise per week, heart healthy diet, and attaining healthy weight.The importance of adequate sleep also discussed. Regular seat belt use and home safety, is also discussed. Changes in health habits are decided on by the patient with goals and time frames  set for achieving them. Immunization and cancer screening needs are specifically addressed at this visit.   Need for prophylactic vaccination and inoculation against influenza After obtaining informed consent, the vaccine is  administered by LPN.   Environmental allergies Uncontrolled, reviewed with pt medication he should use daily and nasonex sent again to the pharmacy

## 2015-07-30 NOTE — Patient Instructions (Signed)
F/u in  5 month, call if you need me before  Flu vaccine today  We are attempting to help yiou get the blood thinner you need by direct contact with cardiology office  Rectal exam is normal  Flu vaccine today  Thanks for choosing [facility], we consider it a privelige to serve you.

## 2015-07-30 NOTE — Assessment & Plan Note (Signed)

## 2015-08-02 ENCOUNTER — Other Ambulatory Visit: Payer: Self-pay | Admitting: Family Medicine

## 2015-08-04 NOTE — Assessment & Plan Note (Signed)
Uncontrolled, reviewed with pt medication he should use daily and nasonex sent again to the pharmacy

## 2015-11-26 ENCOUNTER — Encounter: Payer: Self-pay | Admitting: Cardiology

## 2015-11-26 NOTE — Progress Notes (Signed)
Patient canceled.  This encounter was created in error - please disregard. 

## 2015-11-28 ENCOUNTER — Other Ambulatory Visit: Payer: Self-pay | Admitting: Family Medicine

## 2015-12-30 ENCOUNTER — Ambulatory Visit (INDEPENDENT_AMBULATORY_CARE_PROVIDER_SITE_OTHER): Payer: Medicare Other | Admitting: Family Medicine

## 2015-12-30 ENCOUNTER — Encounter: Payer: Self-pay | Admitting: Family Medicine

## 2015-12-30 VITALS — BP 138/74 | HR 60 | Resp 16 | Ht 64.0 in | Wt 124.0 lb

## 2015-12-30 DIAGNOSIS — T826XXS Infection and inflammatory reaction due to cardiac valve prosthesis, sequela: Secondary | ICD-10-CM

## 2015-12-30 DIAGNOSIS — Z13220 Encounter for screening for lipoid disorders: Secondary | ICD-10-CM

## 2015-12-30 DIAGNOSIS — E559 Vitamin D deficiency, unspecified: Secondary | ICD-10-CM

## 2015-12-30 DIAGNOSIS — I38 Endocarditis, valve unspecified: Secondary | ICD-10-CM

## 2015-12-30 DIAGNOSIS — I10 Essential (primary) hypertension: Secondary | ICD-10-CM | POA: Diagnosis not present

## 2015-12-30 DIAGNOSIS — J3089 Other allergic rhinitis: Secondary | ICD-10-CM

## 2015-12-30 DIAGNOSIS — E782 Mixed hyperlipidemia: Secondary | ICD-10-CM

## 2015-12-30 DIAGNOSIS — J309 Allergic rhinitis, unspecified: Secondary | ICD-10-CM | POA: Insufficient documentation

## 2015-12-30 LAB — COMPLETE METABOLIC PANEL WITH GFR
ALT: 20 U/L (ref 9–46)
AST: 21 U/L (ref 10–35)
Albumin: 4.7 g/dL (ref 3.6–5.1)
Alkaline Phosphatase: 67 U/L (ref 40–115)
BUN: 24 mg/dL (ref 7–25)
CALCIUM: 9.6 mg/dL (ref 8.6–10.3)
CHLORIDE: 98 mmol/L (ref 98–110)
CO2: 27 mmol/L (ref 20–31)
Creat: 0.94 mg/dL (ref 0.70–1.25)
GFR, Est African American: >89 mL/min (ref >=60)
GFR, Est Non African American: 85 mL/min (ref >=60)
GLUCOSE: 93 mg/dL (ref 65–99)
POTASSIUM: 4.2 mmol/L (ref 3.5–5.3)
SODIUM: 135 mmol/L (ref 135–146)
Total Bilirubin: 0.7 mg/dL (ref 0.2–1.2)
Total Protein: 8 g/dL (ref 6.1–8.1)

## 2015-12-30 LAB — CBC WITH DIFFERENTIAL/PLATELET
Basophils Absolute: 107 cells/uL (ref 0–200)
Basophils Relative: 1 %
EOS ABS: 963 {cells}/uL — AB (ref 15–500)
Eosinophils Relative: 9 %
HEMATOCRIT: 40 % (ref 38.5–50.0)
HEMOGLOBIN: 13.3 g/dL (ref 13.2–17.1)
LYMPHS ABS: 2782 {cells}/uL (ref 850–3900)
Lymphocytes Relative: 26 %
MCH: 29.8 pg (ref 27.0–33.0)
MCHC: 33.3 g/dL (ref 32.0–36.0)
MCV: 89.5 fL (ref 80.0–100.0)
MONO ABS: 963 {cells}/uL — AB (ref 200–950)
MPV: 8.6 fL (ref 7.5–12.5)
Monocytes Relative: 9 %
NEUTROS PCT: 55 %
Neutro Abs: 5885 cells/uL (ref 1500–7800)
PLATELETS: 264 10*3/uL (ref 140–400)
RBC: 4.47 MIL/uL (ref 4.20–5.80)
RDW: 13.7 % (ref 11.0–15.0)
WBC: 10.7 10*3/uL (ref 3.8–10.8)

## 2015-12-30 LAB — LIPID PANEL
Cholesterol: 114 mg/dL — ABNORMAL LOW (ref 125–200)
HDL: 38 mg/dL — ABNORMAL LOW (ref >=40)
LDL Cholesterol: 63 mg/dL (ref <130)
Total CHOL/HDL Ratio: 3 Ratio (ref <=5.0)
Triglycerides: 65 mg/dL (ref <150)
VLDL: 13 mg/dL (ref <30)

## 2015-12-30 NOTE — Patient Instructions (Addendum)
Annual wellness end August, call if you need me before   Fasting lipid, cmp and Vit D  No change in medication  Sorry about current illness at home, wish you all the best

## 2015-12-30 NOTE — Assessment & Plan Note (Signed)
Hyperlipidemia:Low fat diet discussed and encouraged.   Lipid Panel  Lab Results  Component Value Date   CHOL 73 03/18/2015   HDL 28* 03/18/2015   LDLCALC 32 03/18/2015   TRIG 64 03/18/2015   CHOLHDL 2.6 03/18/2015      Updated lab needed

## 2015-12-30 NOTE — Progress Notes (Signed)
   Subjective:    Patient ID: [name], male    DOB: 1951-07-16, 65 y.o.   MRN: [id number]  HPI   [name]     MRN: [id number]      DOB: [birth year]   HPI [name] is here for follow up and re-evaluation of chronic medical conditions, medication management and review of any available recent lab and radiology data.  Preventive health is updated, specifically  Cancer screening and Immunization.   Has not made or kept cardiology f/u due in March and still has no blood thinner taking , number in system incorrect ROS Denies recent fever or chills. Denies sinus pressure, nasal congestion, ear pain or sore throat. Denies chest congestion, productive cough or wheezing.Has chronic cough, unchanged, causes him to  Lose his breath, has nop nasal spray for allergies though prescribed and wants to stay on tablets only Denies chest pains, palpitations and leg swelling Denies abdominal pain, nausea, vomiting,diarrhea or constipation.   Denies dysuria, frequency, hesitancy or incontinence. Denies joint pain, swelling and limitation in mobility. Denies headaches, seizures, numbness, or tingling. Denies depression c/o increased anxiety and  Insomnia since his partner is terminally ill with cancer, recently diagnosed Denies skin break down or rash.   PE  BP 138/74 mmHg  Pulse 60  Resp 16  Ht 5\' 4"  (1.626 m)  Wt 124 lb (56.246 kg)  BMI 21.27 kg/m2  SpO2 97%  Patient alert and oriented and in no cardiopulmonary distress.  HEENT: No facial asymmetry, EOMI,   oropharynx pink and moist.  Neck supple no JVD, no mass.  Chest: Clear to auscultation bilaterally.Decreased though adequate air entry  CVS: S1, S2 no murmurs, no S3.Regular rate.  ABD: Soft non tender.   Ext: No edema  MS: Adequate ROM spine, shoulders, hips and knees.  Skin: Intact, no ulcerations or rash noted.  Psych: Good eye contact, normal affect. Memory intact not anxious or depressed appearing.  CNS: CN  2-12 intact, power,  normal throughout.no focal deficits noted.   Assessment & Plan   Essential hypertension, benign Controlled, no change in medication   Prosthetic valve endocarditis Needs new appt for cardiology appt, same will be obtained today before he leaves. Reiterated the need to be on his blood thinner which he still is not taking, cardiology has been unable to contact him  Mixed hyperlipidemia Hyperlipidemia:Low fat diet discussed and encouraged.   Lipid Panel  Lab Results  Component Value Date   CHOL 73 03/18/2015   HDL 28* 03/18/2015   LDLCALC 32 03/18/2015   TRIG 64 03/18/2015   CHOLHDL 2.6 03/18/2015      Updated lab needed   Allergic rhinitis Chronic and uncontrolled, continue daily medication as before, no interest in additional medication       Review of Systems     Objective:   Physical Exam        Assessment & Plan:

## 2015-12-30 NOTE — Assessment & Plan Note (Signed)
Controlled, no change in medication  

## 2015-12-30 NOTE — Assessment & Plan Note (Signed)
Chronic and uncontrolled, continue daily medication as before, no interest in additional medication

## 2015-12-30 NOTE — Assessment & Plan Note (Signed)
Needs new appt for cardiology appt, same will be obtained today before he leaves. Reiterated the need to be on his blood thinner which he still is not taking, cardiology has been unable to contact him

## 2015-12-31 LAB — VITAMIN D 25 HYDROXY (VIT D DEFICIENCY, FRACTURES): VIT D 25 HYDROXY: 54 ng/mL (ref 30–100)

## 2016-01-07 ENCOUNTER — Other Ambulatory Visit: Payer: Self-pay | Admitting: Cardiology

## 2016-01-31 ENCOUNTER — Other Ambulatory Visit: Payer: Self-pay | Admitting: Adult Health

## 2016-03-05 ENCOUNTER — Other Ambulatory Visit: Payer: Self-pay | Admitting: Family Medicine

## 2016-04-09 ENCOUNTER — Encounter: Payer: Self-pay | Admitting: Family Medicine

## 2016-05-05 ENCOUNTER — Other Ambulatory Visit: Payer: Self-pay | Admitting: Cardiology

## 2016-05-12 ENCOUNTER — Ambulatory Visit (INDEPENDENT_AMBULATORY_CARE_PROVIDER_SITE_OTHER): Payer: Medicare Other

## 2016-05-12 VITALS — BP 120/66 | HR 66 | Resp 18 | Ht 64.0 in | Wt 120.0 lb

## 2016-05-12 DIAGNOSIS — Z0000 Encounter for general adult medical examination without abnormal findings: Secondary | ICD-10-CM

## 2016-05-12 DIAGNOSIS — Z23 Encounter for immunization: Secondary | ICD-10-CM | POA: Diagnosis not present

## 2016-05-12 NOTE — Progress Notes (Signed)
Subjective:    [name] is a 65 y.o. male who presents for Medicare Annual/Subsequent preventive examination.   Preventive Screening-Counseling & Management  Tobacco History  Smoking Status  . Former Smoker  . Packs/day: 0.50  . Types: Cigarettes  . Start date: 10/12/1965  . Quit date: 02/19/1970  Smokeless Tobacco  . Never Used     Current Problems (verified) Patient Active Problem List   Diagnosis Date Noted  . Allergic rhinitis 12/30/2015  . Annual physical exam 07/30/2015  . Cough 03/27/2015  . Erectile dysfunction 03/27/2015  . Environmental allergies 12/12/2014  . Special screening for malignant neoplasms, colon 03/03/2014  . Arterial embolus of lower extremity ([place]) 11/23/2013  . Mixed hyperlipidemia 08/25/2013  . History of DVT (deep vein thrombosis) 08/12/2013  . Alcohol abuse 08/12/2013  . Aortic atherosclerosis ([place]) 08/12/2013  . Coronary atherosclerosis of native coronary artery 08/10/2013  . History of GI bleed 08/09/2013  . Encounter for long-term (current) use of anticoagulants 12/19/2010  . Prosthetic valve endocarditis ([place]) 12/10/2010  . Hepatic artery aneurysm ([place]) 12/10/2010  . S/P aortic valve replacement   . Essential hypertension, benign 05/13/2010    Medications Prior to Visit Current Outpatient Prescriptions on File Prior to Visit  Medication Sig Dispense Refill  . atorvastatin (LIPITOR) 40 MG tablet TAKE ONE TABLET BY MOUTH ONCE DAILY 30 tablet 0  . Cholecalciferol (VITAMIN D3) 400 UNITS CAPS Take 1 capsule by mouth daily.    [name] lisinopril (PRINIVIL,ZESTRIL) 10 MG tablet Take 1 tablet (10 mg total) by mouth daily. 90 tablet 3  . lisinopril (PRINIVIL,ZESTRIL) 10 MG tablet TAKE ONE TABLET BY MOUTH ONCE DAILY 90 tablet 3  . metoprolol tartrate (LOPRESSOR) 25 MG tablet TAKE ONE TABLET BY MOUTH TWICE DAILY 60 tablet 2  . mometasone (NASONEX) 50 MCG/ACT nasal spray Place 2 sprays into the nose daily. 17 g 12  . montelukast (SINGULAIR) 10 MG  tablet TAKE ONE TABLET BY MOUTH AT BEDTIME 30 tablet 2  . NITROSTAT 0.4 MG SL tablet DISSOLVE ONE TABLET UNDER THE TONGUE EVERY 5 MINUTES AS NEEDED FOR CHEST PAIN.  DO NOT EXCEED A TOTAL OF 3 DOSES IN 15 MINUTES 25 tablet 3  . triamterene-hydrochlorothiazide (MAXZIDE) 75-50 MG tablet TAKE ONE TABLET BY MOUTH ONCE DAILY 30 tablet 2   No current facility-administered medications on file prior to visit.     Current Medications (verified) Current Outpatient Prescriptions  Medication Sig Dispense Refill  . atorvastatin (LIPITOR) 40 MG tablet TAKE ONE TABLET BY MOUTH ONCE DAILY 30 tablet 0  . Cholecalciferol (VITAMIN D3) 400 UNITS CAPS Take 1 capsule by mouth daily.    [name] lisinopril (PRINIVIL,ZESTRIL) 10 MG tablet Take 1 tablet (10 mg total) by mouth daily. 90 tablet 3  . lisinopril (PRINIVIL,ZESTRIL) 10 MG tablet TAKE ONE TABLET BY MOUTH ONCE DAILY 90 tablet 3  . metoprolol tartrate (LOPRESSOR) 25 MG tablet TAKE ONE TABLET BY MOUTH TWICE DAILY 60 tablet 2  . mometasone (NASONEX) 50 MCG/ACT nasal spray Place 2 sprays into the nose daily. 17 g 12  . montelukast (SINGULAIR) 10 MG tablet TAKE ONE TABLET BY MOUTH AT BEDTIME 30 tablet 2  . NITROSTAT 0.4 MG SL tablet DISSOLVE ONE TABLET UNDER THE TONGUE EVERY 5 MINUTES AS NEEDED FOR CHEST PAIN.  DO NOT EXCEED A TOTAL OF 3 DOSES IN 15 MINUTES 25 tablet 3  . triamterene-hydrochlorothiazide (MAXZIDE) 75-50 MG tablet TAKE ONE TABLET BY MOUTH ONCE DAILY 30 tablet 2   No current facility-administered medications  for this visit.      Allergies (verified) Review of patient's allergies indicates no known allergies.   PAST HISTORY  Family History Family History  Problem Relation Age of Onset  . Diabetes Mother   . Cirrhosis Father   . COPD Sister     Social History Social History  Substance Use Topics  . Smoking status: Former Smoker    Packs/day: 0.50    Types: Cigarettes    Start date: 10/12/1965    Quit date: 02/19/1970  . Smokeless tobacco:  Never Used  . Alcohol use No    Are there smokers in your home (other than you)?  No  Risk Factors Current exercise habits: Home exercise routine includes yard work twice weekly .  Dietary issues discussed: Heart healthy low fat diet encouraged   Cardiac risk factors: advanced age (older than 45 for men, 16 for women), dyslipidemia, hypertension and male gender.  Depression Screen (Note: if answer to either of the following is "Yes", a more complete depression screening is indicated) recent death of fiance   Q1: Over the past two weeks, have you felt down, depressed or hopeless? Yes  Q2: Over the past two weeks, have you felt little interest or pleasure in doing things? Yes  Have you lost interest or pleasure in daily life? Yes  Do you often feel hopeless? No  Do you cry easily over simple problems? No  Activities of Daily Living In your present state of health, do you have any difficulty performing the following activities?:  Driving? Yes no longer drives due to lack of car and license  Managing money?  No Feeding yourself? No Getting from bed to chair? Yes has left leg pain from past surgery and uses can at times in the morning Climbing a flight of stairs? Yes due to left leg pain Preparing food and eating?: No Bathing or showering? No Getting dressed: No Getting to the toilet? No Using the toilet:No Moving around from place to place: Yes due to pain from past surgery on left leg  In the past year have you fallen or had a near fall?:No   Are you sexually active?  No  Do you have more than one partner?  No  Hearing Difficulties: No Do you often ask people to speak up or repeat themselves? No Do you experience ringing or noises in your ears? No Do you have difficulty understanding soft or whispered voices? No   Do you feel that you have a problem with memory? No  Do you often misplace items? No  Do you feel safe at home?  No  Cognitive Testing  Alert? Yes  Normal  Appearance?Yes  Oriented to person? Yes  Place? Yes   Time? Yes  Recall of three objects?  Yes  Can perform simple calculations? Yes  Displays appropriate judgment?Yes  Can read the correct time from a watch face?Yes   Advanced Directives have been discussed with the patient? Yes and living will discussed in detail (patient only has 1 sister in which he would consider for [place])   List the Names of Other Physician/Practitioners you currently use: 1.  Rozann Lesches MD (Cardiology)   Indicate any recent Medical Services you may have received from other than [facility] providers in the past year (date may be approximate).  Immunization History  Administered Date(s) Administered  . Influenza Split 06/11/2011, 06/07/2012  . Influenza,inj,Quad PF,36+ Mos 10/18/2013, 07/04/2014, 07/30/2015, 05/12/2016  . Pneumococcal Conjugate-13 12/12/2014  . Tdap 10/18/2013  Screening Tests Health Maintenance  Topic Date Due  . INFLUENZA VACCINE  04/21/2016  . ZOSTAVAX  08/05/2016 (Originally 10/13/2011)  . TETANUS/TDAP  10/19/2023  . COLONOSCOPY  04/20/2024  . Hepatitis C Screening  Completed  . HIV Screening  Completed    All answers were reviewed with the patient and necessary referrals were made:  Vanetta Mulders, LPN   [id number]   History reviewed: allergies, current medications, past family history, past medical history, past social history, past surgical history and problem list  Review of Systems A comprehensive review of systems was negative.    Objective:     Vision by Snellen chart: right eye:20/25, left [id number] to measure Blood pressure 120/66, pulse 66, resp. rate 18, height 5\' 4"  (1.626 m), weight 120 lb 0.6 oz (54.4 kg), SpO2 100 %. Body mass index is 20.6 kg/m.  No exam performed today, annual wellness visit without physical exam.     Assessment:      Plan:     During the course of the visit the patient was educated and counseled about  appropriate screening and preventive services including:    Advanced directives: has NO advanced directive  - add't info requested. Referral to SW: yes  referral to [facility] for social needs  Diet review for nutrition referral? Yes ____  Not Indicated __x__   Patient Instructions (the written plan) was given to the patient.  Medicare Attestation I have personally reviewed: The patient's medical and social history Their use of alcohol, tobacco or illicit drugs Their current medications and supplements The patient's functional ability including ADLs,fall risks, home safety risks, cognitive, and hearing and visual impairment Diet and physical activities Evidence for depression or mood disorders  The patient's weight, height, BMI, and visual acuity have been recorded in the chart.  I have made referrals, counseling, and provided education to the patient based on review of the above and I have provided the patient with a written personalized care plan for preventive services.     Denman George [employer], [place]   [id number]       Medicare annual wellness visit, subsequent Annual exam as documented. Counseling done  re healthy lifestyle involving commitment to 150 minutes exercise per week, heart healthy diet, and attaining healthy weight.The importance of adequate sleep also discussed. Regular seat belt use and home safety, is also discussed. Changes in health habits are decided on by the patient with goals and time frames  set for achieving them. Immunization and cancer screening needs are specifically addressed at this visit.   Need for prophylactic vaccination and inoculation against influenza After obtaining informed consent, the vaccine is  administered by LPN.

## 2016-05-12 NOTE — Patient Instructions (Signed)
Thank you for coming in today for your Annual Wellness Visit  Dr. Moshe Cipro will see you back in 3 months  Your lab order will be mailed to the address we have on file  Please call [phone] to follow up with Cardiology about your blood thinner.   This is very important!   You will be referred for home services to help around the home as a benefit with Medicaid.   If you have any questions feel free to contact the office.

## 2016-05-14 NOTE — Assessment & Plan Note (Signed)
After obtaining informed consent, the vaccine is  administered by LPN.  

## 2016-05-14 NOTE — Assessment & Plan Note (Signed)

## 2016-05-15 ENCOUNTER — Other Ambulatory Visit: Payer: Self-pay | Admitting: *Deleted

## 2016-05-15 NOTE — Patient Outreach (Signed)
[place] [facility]) Care Management  05/15/2016  [name] November 19, 1950 [id number]   Referral from MD office: Reason- " Social needs; Patient unable to manage medications effectively specifically with blood thinner."  Telephone call to patient; left message on voice mail.  Plan: Will follow up with patient.  Sherrin Daisy, RN BSN [place] Management Coordinator [facility] Care Management  [phone]

## 2016-05-18 ENCOUNTER — Other Ambulatory Visit: Payer: Self-pay | Admitting: *Deleted

## 2016-05-18 NOTE — Patient Outreach (Signed)
[name] Care Management  05/18/2016  [name] Jan 12, 1951 [id number]  Telephone call attempt x 2 ; left HIPPA compliant voice mail requesting return call.   Plan: Will follow up.  Sherrin Daisy, RN BSN [place] Management Coordinator [facility] Care Management  [phone]

## 2016-05-20 ENCOUNTER — Encounter: Payer: Self-pay | Admitting: *Deleted

## 2016-05-20 ENCOUNTER — Other Ambulatory Visit: Payer: Self-pay | Admitting: *Deleted

## 2016-05-20 NOTE — Patient Outreach (Signed)
[facility]) Care Management  05/20/2016  JAYMIEN KILMON 28-Mar-1951 [id number]  Telephone call attempt x 3; left message on voice mail requesting call back.  Plan: Geophysicist/field seismologist. Will follow up.  Sherrin Daisy, RN BSN [place] Management Coordinator [facility] Care Management  [phone]

## 2016-05-21 ENCOUNTER — Other Ambulatory Visit: Payer: Self-pay | Admitting: *Deleted

## 2016-05-21 ENCOUNTER — Encounter: Payer: Self-pay | Admitting: Family Medicine

## 2016-05-21 NOTE — Patient Outreach (Signed)
[place] [facility]) Care Management  05/21/2016  [name] 1950/10/15 [id number]  Telephone call to patient at alternate [phone]; recording states person is not available & unable to leave message.   Plan: Geophysicist/field seismologist. Follow up  Sherrin Daisy, RN BSN [place] Management Coordinator [facility] Care Management  [phone] .

## 2016-05-22 ENCOUNTER — Encounter: Payer: Self-pay | Admitting: *Deleted

## 2016-05-27 ENCOUNTER — Other Ambulatory Visit: Payer: Self-pay | Admitting: *Deleted

## 2016-05-27 NOTE — Patient Outreach (Signed)
[place] [facility]) Care Management  05/27/2016  [name] 26-Jul-1951 [id number]  Telephone call to alternate phone #-person that answered call stated patient did not work there anymore.   Called primary care office to get alternate number if available. Spoke with Nurse Javier Glazier who advised that she did not have any alternate numbers and that he has not returned any of her calls.  Plan: Outreach letter has been sent. Will follow up in 10 business days.  Sherrin Daisy, RN BSN [place] Management Coordinator [facility] Care Management  [phone]

## 2016-06-05 ENCOUNTER — Other Ambulatory Visit: Payer: Self-pay

## 2016-06-05 ENCOUNTER — Other Ambulatory Visit: Payer: Self-pay | Admitting: Cardiology

## 2016-06-05 ENCOUNTER — Other Ambulatory Visit: Payer: Self-pay | Admitting: Family Medicine

## 2016-06-05 MED ORDER — ATORVASTATIN CALCIUM 40 MG PO TABS: 40.0000 mg | ORAL_TABLET | Freq: Every day | ORAL | 1 refills | Status: DC

## 2016-06-24 ENCOUNTER — Other Ambulatory Visit: Payer: Self-pay | Admitting: *Deleted

## 2016-06-24 ENCOUNTER — Encounter: Payer: Self-pay | Admitting: *Deleted

## 2016-06-24 NOTE — Patient Outreach (Signed)
[name] Care Management  06/24/2016  Bruce VAILLANCOURT 1950-10-18 [id number]  Unsuccessful call attempts & no response to outreach letter.  Plan: Send MD closure letter. Send to care management assistant to close out.   Sherrin Daisy, RN BSN [place] Management Coordinator [facility] Care Management  [phone]

## 2016-07-17 NOTE — Progress Notes (Signed)
Cardiology Office Note  Date: 07/20/2016   ID: Meir, Jentsch [birth year], MRN [id number]  PCP: Tula Nakayama, MD  Primary Cardiologist: Rozann Lesches, MD   Chief Complaint  Patient presents with  . Coronary Artery Disease  . History of aortic valve replacement    History of Present Illness: [name] is a medically complex 65 y.o. male last seen in September 2016. He has missed subsequent scheduled follow-up visits and has a history of noncompliance. Interval follow-up with PCP noted.  He presents today stating that he has done well, denies any active chest pain or limiting dyspnea on exertion. No palpitations or syncope. No unusual leg pain.  He was previously on anticoagulation, at one point with Coumadin, then switched to [facility] until this became financially prohibitive. He has not kept regular follow-up and we have discussed other options available to him. He has essentially been off of anticoagulants for at least a year, and has not had a recurrent embolic event. We have discussed initiating Plavix at this time to provide him at least an antiplatelet agent that may be of some benefit. He did not want to try another anticoagulant.  I reviewed his ECG today which shows sinus bradycardia with prolonged PR interval, leftward axis, nonspecific T-wave changes in the anteroseptal leads.  Echocardiogram from last year as outlined below.  He does report compliance with Lopressor, lisinopril, and Lipitor. Lab work per Dr. Moshe Cipro is outlined below.  Past Medical History:  Diagnosis Date  . Arthritis   . CAD (coronary artery disease)    a. s/p PCI with 3.5 x 18 BMS to mid RCA, non obst LAD, Lcx patent 08/10/13  . Depression   . Embolus of femoral artery [facility]) 2012   Surgical repair, reason for lifelong coumadin  . Essential hypertension, benign   . ETOH abuse   . Headache(784.0)   . History of DVT (deep vein thrombosis) 2011  . History of GI bleed    February 2012   . 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 Prosthetic valve endocarditis ([place])   . S/P aortic valve replacement    Details not clear -  reportedly 1971 per patient    Past Surgical History:  Procedure Laterality Date  . Aoric valve replacement  January 2005   70mm Medtronic Freestyle stentless porcine bioprosthesis [facility])  . CARDIAC CATHETERIZATION     30-40% mid LAD, 20-30% ostial D1, proximal 30-40% RCA, 95% mid RCA thrombus, 40% distal stenosis. EF 45%, inferior HK.  [name] COLONOSCOPY N/A 04/20/2014   Procedure: COLONOSCOPY;  Surgeon: Rogene Houston, MD;  Location: [facility]ITE;  Service: Endoscopy;  Laterality: N/A;  930  . CORONARY ANGIOPLASTY WITH STENT PLACEMENT  07/2013  . LEFT HEART CATHETERIZATION WITH CORONARY ANGIOGRAM N/A 08/10/2013   Procedure: LEFT HEART CATHETERIZATION WITH CORONARY ANGIOGRAM;  Surgeon: Blane Ohara, MD;  Location: [facility] CATH LAB;  Service: Cardiovascular;  Laterality: N/A;  . Left leg embolectomy  May 2012   Dr. Trula Slade - reason for lifelong coumadin    Current Outpatient Prescriptions  Medication Sig Dispense Refill  . atorvastatin (LIPITOR) 40 MG tablet Take 1 tablet (40 mg total) by mouth daily. 30 tablet 1  . Cholecalciferol (VITAMIN D3) 400 UNITS CAPS Take 1 capsule by mouth daily.    [name] lisinopril (PRINIVIL,ZESTRIL) 10 MG tablet TAKE ONE  TABLET BY MOUTH ONCE DAILY 90 tablet 3  . metoprolol tartrate (LOPRESSOR) 25 MG tablet TAKE ONE TABLET BY MOUTH TWICE DAILY 60 tablet 2  . montelukast (SINGULAIR) 10 MG tablet TAKE ONE TABLET BY MOUTH ONCE DAILY AT BEDTIME 30 tablet 2  . NITROSTAT 0.4 MG SL tablet DISSOLVE ONE TABLET UNDER THE TONGUE EVERY 5 MINUTES AS NEEDED FOR CHEST PAIN.  DO NOT EXCEED A TOTAL OF 3 DOSES IN 15 MINUTES 25 tablet 3  . triamterene-hydrochlorothiazide (MAXZIDE) 75-50 MG tablet TAKE ONE TABLET BY MOUTH  ONCE DAILY 30 tablet 2  . clopidogrel (PLAVIX) 75 MG tablet Take 1 tablet (75 mg total) by mouth daily. 30 tablet 7   No current facility-administered medications for this visit.    Allergies:  Review of patient's allergies indicates no known allergies.   Social History: The patient  reports that he quit smoking about 46 years ago. His smoking use included Cigarettes. He started smoking about 50 years ago. He smoked 0.50 packs per day. He has never used smokeless tobacco. He reports that he does not drink alcohol or use drugs.  ROS:  Please see the history of present illness. Otherwise, complete review of systems is positive for early satiety, no weight loss.  All other systems are reviewed and negative.   Physical Exam: VS:  BP (!) 156/68   Pulse 61   Ht 5\' 4"  (1.626 m)   Wt 122 lb (55.3 kg)   SpO2 95%   BMI 20.94 kg/m , BMI Body mass index is 20.94 kg/m.  Wt Readings from Last 3 Encounters:  07/20/16 122 lb (55.3 kg)  05/12/16 120 lb 0.6 oz (54.4 kg)  12/30/15 124 lb (56.2 kg)    Patient appears comfortable at rest.  HEENT: Conjunctiva and lids normal, disconjugate gaze, oropharynx clear with poor dentition.  Neck: Supple, elevated JVP or carotid bruits, no thyromegaly.  Lungs: Clear to auscultation, nonlabored breathing at rest.  Cardiac: Regular rate and rhythm, no S3, [id number] diastolic murmur heard at the right base and centrally, no pericardial rub.  Abdomen: Soft, nontender, bowel sounds present, no guarding or rebound.  Extremities: No pitting edema, distal pulses 2+.  Skin: Warm and dry.  Musculoskeletal: No kyphosis.  Neuropsychiatric: Alert and oriented x3, affect grossly appropriate.  ECG: I personally reviewed the tracing from 05/29/2015 which showed sinus bradycardia with prolonged PR interval and nonspecific T-wave changes.  Recent Labwork: 12/30/2015: ALT 20; AST 21; BUN 24; Creat 0.94; Hemoglobin 13.3; Platelets 264; Potassium 4.2; Sodium 135       Component Value Date/Time   CHOL 114 (L) 12/30/2015 0848   TRIG 65 12/30/2015 0848   HDL 38 (L) 12/30/2015 0848   CHOLHDL 3.0 12/30/2015 0848   VLDL 13 12/30/2015 0848   LDLCALC 63 12/30/2015 0848    Other Studies Reviewed Today:  Echocardiogram 12/12/2014: Study Conclusions  - Left ventricle: The cavity size was normal. There was mild concentric hypertrophy. Systolic function was normal. The estimated ejection fraction was in the range of 55% to 60%. Wall motion was normal; there were no regional wall motion abnormalities. - Aortic valve: Normally functioning bioprosthetic aortic valve is seen. There was no stenosis. There was trivial regurgitation. - Mitral valve: There was mild regurgitation. - Left atrium: The atrium was severely dilated. Volume/bsa, ES, (1-plane Simpson&'s, A2C): 49.4 ml/m^2. - Right ventricle: Systolic function was mildly reduced. - Right atrium: The atrium was mildly dilated.  Assessment and Plan:  1. CAD status post BMS to the  RCA back in 2014. He does not report any angina symptoms and continues on beta blocker and statin. We are adding Plavix to his regimen, mainly with history of prior arterial embolus and recurring noncompliance with anticoagulants.  2. History of femoral artery embolus in 2012 status post surgical repair. Long-term anticoagulation had been recommended, unfortunately has he has not been able to maintain compliance and in fact has not been on any therapy for at least a year. I have discussed this with him several times and offered him different options for treatment. He declines anticoagulants although agrees to Plavix.  3. History of bioprosthetic AVR in 2005, echocardiogram from March 2016 is outlined above. LVEF 55-60%.  4. Prior history of GI bleed, no obvious recurrences. Hemoglobin 13.3 earlier this year. Discussed keeping an eye out for any stool changes and follow-up with PCP.  Current medicines were reviewed with  the patient today.   Orders Placed This Encounter  Procedures  . EKG 12-Lead    Disposition: Follow-up in 6 months.  Signed, Satira Sark, MD, [facility] 07/20/2016 9:05 AM    [facility] at [facility] [address], [place], [place] [address] Phone: [phone]; Fax: [phone]

## 2016-07-20 ENCOUNTER — Ambulatory Visit (INDEPENDENT_AMBULATORY_CARE_PROVIDER_SITE_OTHER): Payer: Medicare Other | Admitting: Cardiology

## 2016-07-20 ENCOUNTER — Encounter: Payer: Self-pay | Admitting: Cardiology

## 2016-07-20 VITALS — BP 156/68 | HR 61 | Ht 64.0 in | Wt 122.0 lb

## 2016-07-20 DIAGNOSIS — Z952 Presence of prosthetic heart valve: Secondary | ICD-10-CM

## 2016-07-20 DIAGNOSIS — Z8719 Personal history of other diseases of the digestive system: Secondary | ICD-10-CM

## 2016-07-20 DIAGNOSIS — I743 Embolism and thrombosis of arteries of the lower extremities: Secondary | ICD-10-CM | POA: Diagnosis not present

## 2016-07-20 DIAGNOSIS — I2510 Atherosclerotic heart disease of native coronary artery without angina pectoris: Secondary | ICD-10-CM

## 2016-07-20 MED ORDER — CLOPIDOGREL BISULFATE 75 MG PO TABS: 75.0000 mg | ORAL_TABLET | Freq: Every day | ORAL | 7 refills | Status: DC

## 2016-07-20 NOTE — Patient Instructions (Signed)
Your physician wants you to follow-up in:  6 months Dr Ferne Reus will receive a reminder letter in the mail two months in advance. If you don't receive a letter, please call our office to schedule the follow-up appointment.       START Plavix 75 mg daily       Thank you for choosing [facility] !

## 2016-08-05 ENCOUNTER — Other Ambulatory Visit: Payer: Self-pay | Admitting: Cardiology

## 2016-08-19 ENCOUNTER — Encounter: Payer: Self-pay | Admitting: Family Medicine

## 2016-08-19 ENCOUNTER — Ambulatory Visit (INDEPENDENT_AMBULATORY_CARE_PROVIDER_SITE_OTHER): Payer: Medicare Other | Admitting: Family Medicine

## 2016-08-19 VITALS — BP 138/68 | HR 55 | Resp 16 | Ht 64.0 in | Wt 122.4 lb

## 2016-08-19 DIAGNOSIS — E782 Mixed hyperlipidemia: Secondary | ICD-10-CM

## 2016-08-19 DIAGNOSIS — Z1211 Encounter for screening for malignant neoplasm of colon: Secondary | ICD-10-CM | POA: Diagnosis not present

## 2016-08-19 DIAGNOSIS — I10 Essential (primary) hypertension: Secondary | ICD-10-CM

## 2016-08-19 DIAGNOSIS — Z0000 Encounter for general adult medical examination without abnormal findings: Secondary | ICD-10-CM

## 2016-08-19 DIAGNOSIS — Z125 Encounter for screening for malignant neoplasm of prostate: Secondary | ICD-10-CM

## 2016-08-19 DIAGNOSIS — J3089 Other allergic rhinitis: Secondary | ICD-10-CM

## 2016-08-19 LAB — COMPREHENSIVE METABOLIC PANEL
ALBUMIN: 4.6 g/dL (ref 3.6–5.1)
ALK PHOS: 65 U/L (ref 40–115)
ALT: 18 U/L (ref 9–46)
AST: 22 U/L (ref 10–35)
BILIRUBIN TOTAL: 0.9 mg/dL (ref 0.2–1.2)
BUN: 31 mg/dL — AB (ref 7–25)
CHLORIDE: 97 mmol/L — AB (ref 98–110)
CO2: 24 mmol/L (ref 20–31)
CREATININE: 1.08 mg/dL (ref 0.70–1.25)
Calcium: 9.7 mg/dL (ref 8.6–10.3)
Glucose, Bld: 95 mg/dL (ref 65–99)
Potassium: 4.8 mmol/L (ref 3.5–5.3)
SODIUM: 132 mmol/L — AB (ref 135–146)
TOTAL PROTEIN: 7.9 g/dL (ref 6.1–8.1)

## 2016-08-19 LAB — POC HEMOCCULT BLD/STL (OFFICE/1-CARD/DIAGNOSTIC): Fecal Occult Blood, POC: NEGATIVE

## 2016-08-19 LAB — PSA: PSA: 2.4 ng/mL (ref <=4.0)

## 2016-08-19 LAB — LIPID PANEL
Cholesterol: 108 mg/dL (ref <200)
HDL: 38 mg/dL — AB (ref >40)
LDL Cholesterol: 55 mg/dL (ref <100)
Total CHOL/HDL Ratio: 2.8 Ratio (ref <5.0)
Triglycerides: 76 mg/dL (ref <150)
VLDL: 15 mg/dL (ref <30)

## 2016-08-19 LAB — TSH: TSH: 0.84 m[IU]/L (ref 0.40–4.50)

## 2016-08-19 MED ORDER — FLUTICASONE PROPIONATE 50 MCG/ACT NA SUSP: 2.0000 | Freq: Every day | NASAL | 6 refills | Status: DC

## 2016-08-19 NOTE — Assessment & Plan Note (Signed)
Uncontrolled, add daily flonase

## 2016-08-19 NOTE — Progress Notes (Signed)
[id number]  

## 2016-08-19 NOTE — Assessment & Plan Note (Signed)

## 2016-08-19 NOTE — Patient Instructions (Addendum)
F/u in 4.5 month, call if you need me sooner  Exam today is very good  Labs today please  New additional medication for allergies is flonase spray which is sent to your pharmacy  Thank you  for choosing [facility]. We consider it a privelige to serve you.  Delivering excellent health care in a caring and  compassionate way is our goal.  Partnering with you,  so that together we can achieve this goal is our strategy.

## 2016-08-19 NOTE — Progress Notes (Signed)
   [name]     MRN: [id number]      DOB: 1951/07/16   HPI: Patient is in for annual physical exam. C/o excess nasal drainage which is clear.Denies fever or chills. Immunization is reviewed , and  updated if needed.    PE; Pleasant male, alert and oriented x 3, in no cardio-pulmonary distress. Afebrile. HEENT No facial trauma or asymetry. Sinuses non tender. EOMI, blind in left eye External ears normal, tympanic membranes clear. Oropharynx moist, no exudate.Poor dentition Erythema and edema of nasal mucosa Neck: supple, no adenopathy,JVD or thyromegaly.No bruits.  Chest: Clear to ascultation bilaterally.No crackles or wheezes. Non tender to palpation  Breast: No asymetry,no masses. No nipple discharge or inversion. No axillary or supraclavicular adenopathy  Cardiovascular system; Heart sounds normal,  S1 and  S2 ,no S3.  No murmur, or thrill. Apical beat not displaced Peripheral pulses normal.  Abdomen: Soft, non tender, no organomegaly or masses. No bruits. Bowel sounds normal. No guarding, tenderness or rebound.  Rectal:  Normal sphincter tone. No hemorrhoids or  masses. guaiac negative stool. Prostate smooth and firm    Musculoskeletal exam: Full ROM of spine, hips , shoulders and knees. No deformity ,swelling or crepitus noted. No muscle wasting or atrophy.   Neurologic: Cranial nerves 3 to 12 intact.Bloind in left eye Power, tone ,sensation and reflexes normal throughout. No disturbance in gait. No tremor.  Skin: Intact, no ulceration, erythema , scaling or rash noted. Pigmentation normal throughout  Psych; Normal mood and affect. Judgement and concentration normal   Assessment & Plan:  Annual physical exam Annual exam as documented. Counseling done  re healthy lifestyle involving commitment to 150 minutes exercise per week, heart healthy diet, and attaining healthy weight.The importance of adequate sleep also discussed. Regular seat  belt use and home safety, is also discussed. Changes in health habits are decided on by the patient with goals and time frames  set for achieving them. Immunization and cancer screening needs are specifically addressed at this visit.   Allergic rhinitis Uncontrolled, add daily flonase

## 2016-09-02 ENCOUNTER — Other Ambulatory Visit: Payer: Self-pay | Admitting: Family Medicine

## 2016-10-01 ENCOUNTER — Other Ambulatory Visit: Payer: Self-pay | Admitting: Cardiology

## 2016-10-16 ENCOUNTER — Telehealth: Payer: Self-pay | Admitting: Family Medicine

## 2016-10-22 ENCOUNTER — Telehealth: Payer: Self-pay | Admitting: Family Medicine

## 2016-10-30 ENCOUNTER — Other Ambulatory Visit: Payer: Self-pay | Admitting: Cardiology

## 2016-10-30 NOTE — Telephone Encounter (Signed)
Message left again regarding need to schedule AWV

## 2016-12-30 ENCOUNTER — Ambulatory Visit (INDEPENDENT_AMBULATORY_CARE_PROVIDER_SITE_OTHER): Payer: Medicare Other | Admitting: Family Medicine

## 2016-12-30 ENCOUNTER — Encounter: Payer: Self-pay | Admitting: Family Medicine

## 2016-12-30 VITALS — BP 118/60 | HR 67 | Resp 16 | Ht 64.0 in | Wt 119.8 lb

## 2016-12-30 DIAGNOSIS — I2510 Atherosclerotic heart disease of native coronary artery without angina pectoris: Secondary | ICD-10-CM

## 2016-12-30 DIAGNOSIS — I10 Essential (primary) hypertension: Secondary | ICD-10-CM

## 2016-12-30 DIAGNOSIS — E782 Mixed hyperlipidemia: Secondary | ICD-10-CM

## 2016-12-30 DIAGNOSIS — J302 Other seasonal allergic rhinitis: Secondary | ICD-10-CM | POA: Diagnosis not present

## 2016-12-30 DIAGNOSIS — Z23 Encounter for immunization: Secondary | ICD-10-CM | POA: Diagnosis not present

## 2016-12-30 LAB — CBC
HCT: 40.1 % (ref 38.5–50.0)
Hemoglobin: 13.5 g/dL (ref 13.2–17.1)
MCH: 29.6 pg (ref 27.0–33.0)
MCHC: 33.7 g/dL (ref 32.0–36.0)
MCV: 87.9 fL (ref 80.0–100.0)
MPV: 8.9 fL (ref 7.5–12.5)
Platelets: 266 K/uL (ref 140–400)
RBC: 4.56 MIL/uL (ref 4.20–5.80)
RDW: 13.8 % (ref 11.0–15.0)
WBC: 9.2 K/uL (ref 3.8–10.8)

## 2016-12-30 LAB — COMPLETE METABOLIC PANEL WITHOUT GFR
ALT: 15 U/L (ref 9–46)
AST: 22 U/L (ref 10–35)
Albumin: 4.5 g/dL (ref 3.6–5.1)
Alkaline Phosphatase: 61 U/L (ref 40–115)
BUN: 28 mg/dL — ABNORMAL HIGH (ref 7–25)
CO2: 24 mmol/L (ref 20–31)
Calcium: 9.6 mg/dL (ref 8.6–10.3)
Chloride: 97 mmol/L — ABNORMAL LOW (ref 98–110)
Creat: 1.09 mg/dL (ref 0.70–1.25)
GFR, Est African American: 82 mL/min (ref >=60)
GFR, Est Non African American: 71 mL/min (ref >=60)
Glucose, Bld: 100 mg/dL — ABNORMAL HIGH (ref 65–99)
Potassium: 4.8 mmol/L (ref 3.5–5.3)
Sodium: 131 mmol/L — ABNORMAL LOW (ref 135–146)
Total Bilirubin: 0.7 mg/dL (ref 0.2–1.2)
Total Protein: 7.3 g/dL (ref 6.1–8.1)

## 2016-12-30 LAB — LIPID PANEL
CHOL/HDL RATIO: 2.2 ratio (ref <5.0)
Cholesterol: 98 mg/dL (ref <200)
HDL: 45 mg/dL (ref >40)
LDL CALC: 41 mg/dL (ref <100)
TRIGLYCERIDES: 58 mg/dL (ref <150)
VLDL: 12 mg/dL (ref <30)

## 2016-12-30 NOTE — Assessment & Plan Note (Signed)
Controlled, no change in medication  

## 2016-12-30 NOTE — Progress Notes (Signed)
   [name]     MRN: [id number]      DOB: October 24, 1950   HPI [name] is here for follow up and re-evaluation of chronic medical conditions, medication management and review of any available recent lab and radiology data.  Preventive health is updated, specifically  Cancer screening and Immunization.   Questions or concerns regarding consultations or procedures which the PT has had in the interim are  addressed. The PT denies any adverse reactions to current medications since the last visit.  There are no new concerns.  There are no specific complaints   ROS Denies recent fever or chills. Denies sinus pressure, nasal congestion, ear pain or sore throat. Denies chest congestion, productive cough or wheezing. Denies chest pains, palpitations and leg swelling Denies abdominal pain, nausea, vomiting,diarrhea or constipation.   Denies dysuria, frequency, hesitancy or incontinence. Denies joint pain, swelling and limitation in mobility. Denies headaches, seizures, numbness, or tingling. Denies depression, anxiety or insomnia. Denies skin break down or rash.   PE  BP 118/60   Pulse 67   Resp 16   Ht 5\' 4"  (1.626 m)   Wt 119 lb 12.8 oz (54.3 kg)   SpO2 98%   BMI 20.56 kg/m   Patient alert and oriented and in no cardiopulmonary distress.  HEENT: No facial asymmetry, EOMI,   oropharynx pink and moist.  Neck supple no JVD, no mass.  Chest: Clear to auscultation bilaterally.Decreased though adequate air entry  CVS: S1, S2 no murmurs, no S3.Regular rate.  ABD: Soft non tender.   Ext: No edema  MS: Adequate ROM spine, shoulders, hips and knees.  Skin: Intact, no ulcerations or rash noted.  Psych: Good eye contact, normal affect. Memory intact not anxious or depressed appearing.  CNS: CN 2-12 intact, power,  normal throughout.no focal deficits noted.   Assessment & Plan  Coronary atherosclerosis of native coronary artery Stable and doing well, but f/u due per Oct  note, will refer   Essential hypertension, benign Controlled, no change in medication DASH diet and commitment to daily physical activity for a minimum of 30 minutes discussed and encouraged, as a part of hypertension management. The importance of attaining a healthy weight is also discussed.  BP/Weight 12/30/2016 08/19/2016 07/20/2016 05/12/2016 12/30/2015 97/02/7340 05/24/7901  Systolic BP 409 735 329 924 268 341 962  Diastolic BP 60 68 68 66 74 82 72  Wt. (Lbs) 119.8 122.4 122 120.04 124 127.4 126.8  BMI 20.56 21.01 20.94 20.6 21.27 21.86 21.75       Mixed hyperlipidemia Hyperlipidemia:Low fat diet discussed and encouraged.   Lipid Panel  Lab Results  Component Value Date   CHOL 108 08/19/2016   HDL 38 (L) 08/19/2016   LDLCALC 55 08/19/2016   TRIG 76 08/19/2016   CHOLHDL 2.8 08/19/2016     Updated lab needed at/ before next visit.   Allergic rhinitis Controlled, no change in medication   Need for 23-polyvalent pneumococcal polysaccharide vaccine After obtaining informed consent, the vaccine is  administered by LPN.

## 2016-12-30 NOTE — Patient Instructions (Addendum)
Wellness visit with Albina Billet early September/ end August  Physical exam with MD first week in December   Labs today cmp and EGFR, lipid and cBC today   Pneumonia 23 today today   You are referred to Cardiology for f/u  Thank you  for choosing [facility]. We consider it a privelige to serve you.  Delivering excellent health care in a caring and  compassionate way is our goal.  Partnering with you,  so that together we can achieve this goal is our strategy.

## 2016-12-30 NOTE — Assessment & Plan Note (Signed)
Hyperlipidemia:Low fat diet discussed and encouraged.   Lipid Panel  Lab Results  Component Value Date   CHOL 108 08/19/2016   HDL 38 (L) 08/19/2016   LDLCALC 55 08/19/2016   TRIG 76 08/19/2016   CHOLHDL 2.8 08/19/2016     Updated lab needed at/ before next visit.

## 2016-12-30 NOTE — Assessment & Plan Note (Signed)
Stable and doing well, but f/u due per Oct note, will refer

## 2016-12-30 NOTE — Assessment & Plan Note (Signed)
After obtaining informed consent, the vaccine is  administered by LPN.  

## 2016-12-30 NOTE — Assessment & Plan Note (Signed)
Controlled, no change in medication DASH diet and commitment to daily physical activity for a minimum of 30 minutes discussed and encouraged, as a part of hypertension management. The importance of attaining a healthy weight is also discussed.  BP/Weight 12/30/2016 08/19/2016 07/20/2016 05/12/2016 12/30/2015 54/11/6065 7/0/3403  Systolic BP 524 818 590 931 121 624 469  Diastolic BP 60 68 68 66 74 82 72  Wt. (Lbs) 119.8 122.4 122 120.04 124 127.4 126.8  BMI 20.56 21.01 20.94 20.6 21.27 21.86 21.75

## 2017-01-19 ENCOUNTER — Other Ambulatory Visit: Payer: Self-pay | Admitting: Family Medicine

## 2017-03-30 ENCOUNTER — Other Ambulatory Visit: Payer: Self-pay | Admitting: Cardiology

## 2017-04-01 ENCOUNTER — Other Ambulatory Visit: Payer: Self-pay

## 2017-04-01 MED ORDER — ATORVASTATIN CALCIUM 40 MG PO TABS: ORAL_TABLET | ORAL | 0 refills | Status: DC

## 2017-04-01 NOTE — Telephone Encounter (Signed)
Refilled atorvastatin with #15,needs fu apt

## 2017-04-01 NOTE — Telephone Encounter (Signed)
Pt was due for fu 2 months ago, given 30 day supply, no apt made, now 15 day supply given

## 2017-05-17 ENCOUNTER — Ambulatory Visit: Payer: Self-pay

## 2017-05-19 ENCOUNTER — Telehealth: Payer: Self-pay

## 2017-05-19 ENCOUNTER — Ambulatory Visit (INDEPENDENT_AMBULATORY_CARE_PROVIDER_SITE_OTHER): Payer: Medicare Other

## 2017-05-19 VITALS — BP 124/60 | HR 48 | Temp 97.7°F | Ht 64.0 in | Wt 117.1 lb

## 2017-05-19 DIAGNOSIS — Z23 Encounter for immunization: Secondary | ICD-10-CM | POA: Diagnosis not present

## 2017-05-19 DIAGNOSIS — Z0000 Encounter for general adult medical examination without abnormal findings: Secondary | ICD-10-CM

## 2017-05-19 DIAGNOSIS — Z135 Encounter for screening for eye and ear disorders: Secondary | ICD-10-CM

## 2017-05-19 NOTE — Progress Notes (Signed)
Subjective:   [name] is a 66 y.o. male who presents for Medicare Annual/Subsequent preventive examination.  Review of Systems:  Cardiac Risk Factors include: advanced age (>22men, >59 women);dyslipidemia;hypertension;male gender     Objective:    Vitals: BP 124/60   Pulse (!) 48   Temp 97.7 F (36.5 C) (Oral)   Ht 5\' 4"  (1.626 m)   Wt 117 lb 1.9 oz (53.1 kg)   BMI 20.10 kg/m   Body mass index is 20.1 kg/m.  Tobacco History  Smoking Status  . Former Smoker  . Packs/day: 0.50  . Years: 3.00  . Types: Cigarettes  . Start date: 10/12/1965  . Quit date: 02/19/1970  Smokeless Tobacco  . Never Used     Counseling given: Not Answered   Past Medical History:  Diagnosis Date  . Arthritis   . CAD (coronary artery disease)    a. s/p PCI with 3.5 x 18 BMS to mid RCA, non obst LAD, Lcx patent 08/10/13  . Depression   . Embolus of femoral artery [facility]) 2012   Surgical repair, reason for lifelong coumadin  . Essential hypertension, benign   . ETOH abuse   . Headache(784.0)   . History of DVT (deep vein thrombosis) 2011  . History of GI bleed    February 2012   . 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 Prosthetic valve endocarditis ([place])   . S/P aortic valve replacement    Details not clear -  reportedly 1971 per patient   Past Surgical History:  Procedure Laterality Date  . Aoric valve replacement  January 2005   40mm Medtronic Freestyle stentless porcine bioprosthesis [facility])  . CARDIAC CATHETERIZATION     30-40% mid LAD, 20-30% ostial D1, proximal 30-40% RCA, 95% mid RCA thrombus, 40% distal stenosis. EF 45%, inferior HK.  [name] COLONOSCOPY N/A 04/20/2014   Procedure: COLONOSCOPY;  Surgeon: Rogene Houston, MD;  Location[facility];  Service: Endoscopy;  Laterality: N/A;  930  . CORONARY ANGIOPLASTY WITH STENT PLACEMENT  07/2013  . LEFT HEART  CATHETERIZATION WITH CORONARY ANGIOGRAM N/A 08/10/2013   Procedure: LEFT HEART CATHETERIZATION WITH CORONARY ANGIOGRAM;  Surgeon: Blane Ohara, MD;  Location: [facility] CATH LAB;  Service: Cardiovascular;  Laterality: N/A;  . Left leg embolectomy  May 2012   Dr. Trula Slade - reason for lifelong coumadin   Family History  Problem Relation Age of Onset  . Diabetes Mother   . Cirrhosis Father   . COPD Sister    History  Sexual Activity  . Sexual activity: Not Currently    Outpatient Encounter Prescriptions as of 05/19/2017  Medication Sig  . atorvastatin (LIPITOR) 40 MG tablet Take 1 tablet by mouth at dinner daily, Must make fu apt for refills  . Cholecalciferol (VITAMIN D3) 400 UNITS CAPS Take 1 capsule by mouth daily.  . clopidogrel (PLAVIX) 75 MG tablet TAKE ONE TABLET BY MOUTH ONCE DAILY  . lisinopril (PRINIVIL,ZESTRIL) 10 MG tablet TAKE ONE TABLET BY MOUTH ONCE DAILY  . metoprolol tartrate (LOPRESSOR) 25 MG tablet TAKE ONE TABLET BY MOUTH TWICE DAILY  . montelukast (SINGULAIR) 10 MG tablet TAKE ONE TABLET BY MOUTH AT BEDTIME  . Naproxen Sodium (ALEVE) 220 MG CAPS Take 1 capsule by mouth 2 (two) times daily.  [name] NITROSTAT 0.4 MG SL tablet DISSOLVE ONE TABLET UNDER THE TONGUE  EVERY 5 MINUTES AS NEEDED FOR CHEST PAIN.  DO NOT EXCEED A TOTAL OF 3 DOSES IN 15 MINUTES  . triamterene-hydrochlorothiazide (MAXZIDE) 75-50 MG tablet TAKE ONE TABLET BY MOUTH ONCE DAILY  . [DISCONTINUED] fluticasone (FLONASE) 50 MCG/ACT nasal spray Place 2 sprays into both nostrils daily.   No facility-administered encounter medications on file as of 05/19/2017.     Activities of Daily Living In your present state of health, do you have any difficulty performing the following activities: 05/19/2017  Hearing? N  Vision? Y  Comment due to loss of vision in his left eye  Difficulty concentrating or making decisions? N  Walking or climbing stairs? N  Dressing or bathing? N  Doing errands, shopping? Y  Comment no  longer drives  Preparing Food and eating ? N  Using the Toilet? N  In the past six months, have you accidently leaked urine? N  Do you have problems with loss of bowel control? N  Managing your Medications? N  Managing your Finances? N  Housekeeping or managing your Housekeeping? N  Some recent data might be hidden    Patient Care Team: [name], MD as PCP - General [name], Lavell Islam, MD (Infectious Diseases) Satira Sark, MD (Cardiology)   Assessment:    Exercise Activities and Dietary recommendations Current Exercise Habits: Home exercise routine, Time (Minutes): > 60, Frequency (Times/Week): 5, Weekly Exercise (Minutes/Week): 0, Intensity: Mild, Exercise limited by: cardiac condition(s)  Goals    . maintain current exercise routine      Fall Risk Fall Risk  05/19/2017 12/30/2016 03/27/2015  Falls in the past year? No No No  Risk for fall due to : Impaired balance/gait - Impaired balance/gait   Depression Screen PHQ 2/9 Scores 05/19/2017 05/12/2016 03/27/2015 12/12/2014  PHQ - 2 Score 0 6 0 0  PHQ- 9 Score 0 13 - 0    Cognitive Function: Normal    6CIT Screen 05/19/2017  What Year? 0 points  What month? 0 points  What time? 0 points  Count back from 20 0 points  Months in reverse 0 points  Repeat phrase 0 points  Total Score 0    Immunization History  Administered Date(s) Administered  . Influenza Split 06/11/2011, 06/07/2012  . Influenza,inj,Quad PF,6+ Mos 10/18/2013, 07/04/2014, 07/30/2015, 05/12/2016, 05/19/2017  . Pneumococcal Conjugate-13 12/12/2014  . Pneumococcal Polysaccharide-23 12/30/2016  . Tdap 10/18/2013   Screening Tests Health Maintenance  Topic Date Due  . TETANUS/TDAP  10/19/2023  . COLONOSCOPY  04/20/2024  . INFLUENZA VACCINE  Completed  . Hepatitis C Screening  Completed  . HIV Screening  Completed  . PNA vac Low Risk Adult  Completed      Plan:   I have personally reviewed and noted the following in the patient's  chart:   . Medical and social history . Use of alcohol, tobacco or illicit drugs  . Current medications and supplements . Functional ability and status . Nutritional status . Physical activity . Advanced directives . List of other physicians . Hospitalizations, surgeries, and ER visits in previous 12 months . Vitals . Screenings to include cognitive, depression, and falls . Referrals and appointments: Referral to Dr. Gershon Crane for glaucoma screening. Community resource referral sent to assist patient in getting a cane to help with walking.   In addition, I have reviewed and discussed with patient certain preventive protocols, quality metrics, and best practice recommendations. A written personalized care plan for preventive services as well as general preventive health  recommendations were provided to patient.     Stormy Fabian, LPN  03/16/9484

## 2017-05-19 NOTE — Patient Instructions (Addendum)
[name] ,  Thank you for taking time to come for your Medicare Wellness Visit. I appreciate your ongoing commitment to your health goals. Please review the following plan we discussed and let me know if I can assist you in the future.   Screening recommendations/referrals: Colonoscopy: Up to date, next due 03/2024 Recommended yearly ophthalmology/optometry visit for glaucoma screening and checkup Recommended yearly dental visit for hygiene and checkup  Vaccinations: Influenza vaccine: Administered today Pneumococcal vaccine: Up to date, next due 12/2017 Tdap vaccine: Up to date, next due 09/2023 Shingles vaccine: Due, declines    Advanced directives: Advance directive discussed with you today. I have provided a copy for you to complete at home and have notarized. Once this is complete please bring a copy in to our office so we can scan it into your chart.  Next appointment: Follow up with Dr. Moshe Cipro in 3 months for your complete physical. Follow up in 1 year for your annual wellness visit.   Preventive Care 3 Years and Older, Male Preventive care refers to lifestyle choices and visits with your health care provider that can promote health and wellness. What does preventive care include?  A yearly physical exam. This is also called an annual well check.  Dental exams once or twice a year.  Routine eye exams. Ask your health care provider how often you should have your eyes checked.  Personal lifestyle choices, including:  Daily care of your teeth and gums.  Regular physical activity.  Eating a healthy diet.  Avoiding tobacco and drug use.  Limiting alcohol use.  Practicing safe sex.  Taking low doses of aspirin every day.  Taking vitamin and mineral supplements as recommended by your health care provider. What happens during an annual well check? The services and screenings done by your health care provider during your annual well check will depend on your age, overall  health, lifestyle risk factors, and family history of disease. Counseling  Your health care provider may ask you questions about your:  Alcohol use.  Tobacco use.  Drug use.  Emotional well-being.  Home and relationship well-being.  Sexual activity.  Eating habits.  History of falls.  Memory and ability to understand (cognition).  Work and work Statistician. Screening  You may have the following tests or measurements:  Height, weight, and BMI.  Blood pressure.  Lipid and cholesterol levels. These may be checked every 5 years, or more frequently if you are over 76 years old.  Skin check.  Lung cancer screening. You may have this screening every year starting at age 75 if you have a 30-pack-year history of smoking and currently smoke or have quit within the past 15 years.  Fecal occult blood test (FOBT) of the stool. You may have this test every year starting at age 68.  Flexible sigmoidoscopy or colonoscopy. You may have a sigmoidoscopy every 5 years or a colonoscopy every 10 years starting at age 81.  Prostate cancer screening. Recommendations will vary depending on your family history and other risks.  Hepatitis C blood test.  Hepatitis B blood test.  Sexually transmitted disease (STD) testing.  Diabetes screening. This is done by checking your blood sugar (glucose) after you have not eaten for a while (fasting). You may have this done every 1-3 years.  Abdominal aortic aneurysm (AAA) screening. You may need this if you are a current or former smoker.  Osteoporosis. You may be screened starting at age 31 if you are at high risk. Talk  with your health care provider about your test results, treatment options, and if necessary, the need for more tests. Vaccines  Your health care provider may recommend certain vaccines, such as:  Influenza vaccine. This is recommended every year.  Tetanus, diphtheria, and acellular pertussis (Tdap, Td) vaccine. You may need a Td  booster every 10 years.  Zoster vaccine. You may need this after age 62.  Pneumococcal 13-valent conjugate (PCV13) vaccine. One dose is recommended after age 33.  Pneumococcal polysaccharide (PPSV23) vaccine. One dose is recommended after age 24. Talk to your health care provider about which screenings and vaccines you need and how often you need them. This information is not intended to replace advice given to you by your health care provider. Make sure you discuss any questions you have with your health care provider. Document Released: 10/04/2015 Document Revised: 05/27/2016 Document Reviewed: 07/09/2015 Elsevier Interactive Patient Education  2017 [place] Prevention in the Home Falls can cause injuries. They can happen to people of all ages. There are many things you can do to make your home safe and to help prevent falls. What can I do on the outside of my home?  Regularly fix the edges of walkways and driveways and fix any cracks.  Remove anything that might make you trip as you walk through a door, such as a raised step or threshold.  Trim any bushes or trees on the path to your home.  Use bright outdoor lighting.  Clear any walking paths of anything that might make someone trip, such as rocks or tools.  Regularly check to see if handrails are loose or broken. Make sure that both sides of any steps have handrails.  Any raised decks and porches should have guardrails on the edges.  Have any leaves, snow, or ice cleared regularly.  Use sand or salt on walking paths during winter.  Clean up any spills in your garage right away. This includes oil or grease spills. What can I do in the bathroom?  Use night lights.  Install grab bars by the toilet and in the tub and shower. Do not use towel bars as grab bars.  Use non-skid mats or decals in the tub or shower.  If you need to sit down in the shower, use a plastic, non-slip stool.  Keep the floor dry. Clean up  any water that spills on the floor as soon as it happens.  Remove soap buildup in the tub or shower regularly.  Attach bath mats securely with double-sided non-slip rug tape.  Do not have throw rugs and other things on the floor that can make you trip. What can I do in the bedroom?  Use night lights.  Make sure that you have a light by your bed that is easy to reach.  Do not use any sheets or blankets that are too big for your bed. They should not hang down onto the floor.  Have a firm chair that has side arms. You can use this for support while you get dressed.  Do not have throw rugs and other things on the floor that can make you trip. What can I do in the kitchen?  Clean up any spills right away.  Avoid walking on wet floors.  Keep items that you use a lot in easy-to-reach places.  If you need to reach something above you, use a strong step stool that has a grab bar.  Keep electrical cords out of the way.  Do  not use floor polish or wax that makes floors slippery. If you must use wax, use non-skid floor wax.  Do not have throw rugs and other things on the floor that can make you trip. What can I do with my stairs?  Do not leave any items on the stairs.  Make sure that there are handrails on both sides of the stairs and use them. Fix handrails that are broken or loose. Make sure that handrails are as long as the stairways.  Check any carpeting to make sure that it is firmly attached to the stairs. Fix any carpet that is loose or worn.  Avoid having throw rugs at the top or bottom of the stairs. If you do have throw rugs, attach them to the floor with carpet tape.  Make sure that you have a light switch at the top of the stairs and the bottom of the stairs. If you do not have them, ask someone to add them for you. What else can I do to help prevent falls?  Wear shoes that:  Do not have high heels.  Have rubber bottoms.  Are comfortable and fit you well.  Are  closed at the toe. Do not wear sandals.  If you use a stepladder:  Make sure that it is fully opened. Do not climb a closed stepladder.  Make sure that both sides of the stepladder are locked into place.  Ask someone to hold it for you, if possible.  Clearly mark and make sure that you can see:  Any grab bars or handrails.  First and last steps.  Where the edge of each step is.  Use tools that help you move around (mobility aids) if they are needed. These include:  Canes.  Walkers.  Scooters.  Crutches.  Turn on the lights when you go into a dark area. Replace any light bulbs as soon as they burn out.  Set up your furniture so you have a clear path. Avoid moving your furniture around.  If any of your floors are uneven, fix them.  If there are any pets around you, be aware of where they are.  Review your medicines with your doctor. Some medicines can make you feel dizzy. This can increase your chance of falling. Ask your doctor what other things that you can do to help prevent falls. This information is not intended to replace advice given to you by your health care provider. Make sure you discuss any questions you have with your health care provider. Document Released: 07/04/2009 Document Revised: 02/13/2016 Document Reviewed: 10/12/2014 Elsevier Interactive Patient Education  2017 [employer].

## 2017-05-19 NOTE — Telephone Encounter (Signed)
During patient's AWV today he stated he has a dry cough with no other symptoms. Lung sounds clear upon auscultation. He does not want to take anything OTC until he knows what you would like him to do. Please advise.

## 2017-05-21 NOTE — Telephone Encounter (Signed)
Please let pt know that the dry cough is from his allergies acting up and his singulair will help him with this. If he feels the symptoms are excessive and a nose spray has helped in the past during the bad season call back or let me know so I can send in flonase, butexplain may not be covered by his insurance please

## 2017-05-21 NOTE — Telephone Encounter (Signed)
Patient informed of message below, verbalized understanding.  

## 2017-06-21 NOTE — Telephone Encounter (Signed)
Issue resolved.

## 2017-06-24 ENCOUNTER — Other Ambulatory Visit: Payer: Self-pay | Admitting: Cardiology

## 2017-06-24 ENCOUNTER — Other Ambulatory Visit: Payer: Self-pay | Admitting: Family Medicine

## 2017-06-24 NOTE — Telephone Encounter (Signed)
Seen 4 11 18 

## 2017-07-16 ENCOUNTER — Telehealth: Payer: Self-pay | Admitting: Family Medicine

## 2017-07-16 NOTE — Telephone Encounter (Signed)
Patient called insurance [employer] and was told that if he would have the doctor call  [facility] [phone] he could qualify for having his prescriptions sent to his home. I provided patient our fax number and asked him to have pill pack fax [place] the request for his prescriptions.

## 2017-07-20 NOTE — Telephone Encounter (Signed)
Noted and agree. 

## 2017-08-16 ENCOUNTER — Other Ambulatory Visit: Payer: Self-pay

## 2017-08-16 ENCOUNTER — Telehealth: Payer: Self-pay | Admitting: Family Medicine

## 2017-08-16 DIAGNOSIS — I10 Essential (primary) hypertension: Secondary | ICD-10-CM

## 2017-08-16 DIAGNOSIS — E782 Mixed hyperlipidemia: Secondary | ICD-10-CM

## 2017-08-16 DIAGNOSIS — I2510 Atherosclerotic heart disease of native coronary artery without angina pectoris: Secondary | ICD-10-CM

## 2017-08-16 MED ORDER — ATORVASTATIN CALCIUM 40 MG PO TABS: ORAL_TABLET | ORAL | 1 refills | Status: DC

## 2017-08-16 MED ORDER — MONTELUKAST SODIUM 10 MG PO TABS: 10.0000 mg | ORAL_TABLET | Freq: Every day | ORAL | 1 refills | Status: DC

## 2017-08-16 MED ORDER — METOPROLOL TARTRATE 25 MG PO TABS: 25.0000 mg | ORAL_TABLET | Freq: Two times a day (BID) | ORAL | 1 refills | Status: DC

## 2017-08-16 MED ORDER — TRIAMTERENE-HCTZ 75-50 MG PO TABS: 1.0000 | ORAL_TABLET | Freq: Every day | ORAL | 1 refills | Status: DC

## 2017-08-16 NOTE — Telephone Encounter (Signed)
[name] from [facility] called in to ask about patients medications, I returned call to get more information, no answer, left message

## 2017-08-16 NOTE — Telephone Encounter (Signed)
meds sent to [facility]

## 2017-08-16 NOTE — Telephone Encounter (Signed)
Patient called and said he didn't receive atorvastatin (LIPITOR) 40MG , pill pack states they never received an order for this. Cb#: [phone]

## 2017-08-16 NOTE — Patient Outreach (Signed)
[facility] Care Management  08/16/2017  [name] December 03, 1950 [id number]   Medication Adherence call to [name] Abe patient is showing past due under [facility] Ins.on Atorvastatin 40 mg and Lisinopril 10 mg spoke to patient he said he is having trouble getting the doctor to fill his medication he said he has call doctor office and they keep telling him they will call back patient is out of medication and needs his medication.call doctor's office and spoke with Dusty she said she was going to take care and will have the nurse send in a prescription to the mail order [facility],call [name] back and told him the nurse was going to take care and will send in a prescription to the mail order.   [facility] Direct Dial [phone]  Fax [phone] [email or web address]

## 2017-08-26 ENCOUNTER — Other Ambulatory Visit (INDEPENDENT_AMBULATORY_CARE_PROVIDER_SITE_OTHER): Payer: Medicare Other

## 2017-08-26 ENCOUNTER — Encounter: Payer: Self-pay | Admitting: Family Medicine

## 2017-08-26 ENCOUNTER — Ambulatory Visit (INDEPENDENT_AMBULATORY_CARE_PROVIDER_SITE_OTHER): Payer: Medicare Other | Admitting: Family Medicine

## 2017-08-26 VITALS — BP 118/74 | HR 50 | Resp 16 | Ht 64.0 in | Wt 117.0 lb

## 2017-08-26 DIAGNOSIS — Z1211 Encounter for screening for malignant neoplasm of colon: Secondary | ICD-10-CM

## 2017-08-26 DIAGNOSIS — Z125 Encounter for screening for malignant neoplasm of prostate: Secondary | ICD-10-CM

## 2017-08-26 DIAGNOSIS — Z0000 Encounter for general adult medical examination without abnormal findings: Secondary | ICD-10-CM

## 2017-08-26 DIAGNOSIS — E782 Mixed hyperlipidemia: Secondary | ICD-10-CM

## 2017-08-26 DIAGNOSIS — I10 Essential (primary) hypertension: Secondary | ICD-10-CM

## 2017-08-26 LAB — COMPREHENSIVE METABOLIC PANEL
AG Ratio: 1.5 (calc) (ref 1.0–2.5)
ALBUMIN MSPROF: 4.5 g/dL (ref 3.6–5.1)
ALT: 9 U/L (ref 9–46)
AST: 16 U/L (ref 10–35)
Alkaline phosphatase (APISO): 60 U/L (ref 40–115)
BUN: 21 mg/dL (ref 7–25)
CHLORIDE: 97 mmol/L — AB (ref 98–110)
CO2: 25 mmol/L (ref 20–32)
CREATININE: 0.99 mg/dL (ref 0.70–1.25)
Calcium: 9.6 mg/dL (ref 8.6–10.3)
GLOBULIN: 3.1 g/dL (ref 1.9–3.7)
GLUCOSE: 97 mg/dL (ref 65–99)
POTASSIUM: 4.7 mmol/L (ref 3.5–5.3)
SODIUM: 130 mmol/L — AB (ref 135–146)
Total Bilirubin: 0.9 mg/dL (ref 0.2–1.2)
Total Protein: 7.6 g/dL (ref 6.1–8.1)

## 2017-08-26 LAB — LIPID PANEL
CHOL/HDL RATIO: 3.1 (calc) (ref <5.0)
Cholesterol: 159 mg/dL (ref <200)
HDL: 51 mg/dL (ref >40)
LDL CHOLESTEROL (CALC): 94 mg/dL
Non-HDL Cholesterol (Calc): 108 mg/dL (calc) (ref <130)
Triglycerides: 48 mg/dL (ref <150)

## 2017-08-26 LAB — POC HEMOCCULT BLD/STL (OFFICE/1-CARD/DIAGNOSTIC): FECAL OCCULT BLD: NEGATIVE

## 2017-08-26 LAB — PSA: PSA: 2.4 ng/mL (ref <=4.0)

## 2017-08-26 MED ORDER — ATORVASTATIN CALCIUM 40 MG PO TABS: ORAL_TABLET | ORAL | 2 refills | Status: DC

## 2017-08-26 MED ORDER — CLOPIDOGREL BISULFATE 75 MG PO TABS: 75.0000 mg | ORAL_TABLET | Freq: Every day | ORAL | 2 refills | Status: DC

## 2017-08-26 NOTE — Assessment & Plan Note (Signed)
Annual exam as documented. . Immunization and cancer screening needs are specifically addressed at this visit.  

## 2017-08-26 NOTE — Progress Notes (Signed)
   [name]     MRN: [id number]      DOB: Apr 18, 1951   HPI: Patient is in for annual physical exam. No other health concerns are expressed or addressed at the visit. Recent labs, if available are reviewed. Immunization is reviewed , and  updated if needed.    PE; BP 118/74   Pulse (!) 50   Resp 16   Ht 5\' 4"  (1.626 m)   Wt 117 lb (53.1 kg)   SpO2 98%   BMI 20.08 kg/m   Pleasant male, alert and oriented x 3, in no cardio-pulmonary distress. Afebrile. HEENT No facial trauma or asymetry. Sinuses non tender. EOMI, t. External ears normal, tympanic membranes clear. Oropharynx moist, no exudate. Neck: supple, no adenopathy,JVD or thyromegaly.No bruits.  Chest: Clear to ascultation bilaterally.No crackles or wheezes. Non tender to palpation  Breast: No asymetry,no masses. No nipple discharge or inversion. No axillary or supraclavicular adenopathy  Cardiovascular system; Heart sounds normal,  S1 and  S2 ,no S3.  Systolic  murmur,no thrill. Apical beat not displaced Peripheral pulses normal.  Abdomen: Soft, non tender, no organomegaly or masses. No bruits. Bowel sounds normal. No guarding, tenderness or rebound.  Rectal:  Normal sphincter tone. No hemorrhoids or  masses. guaiac negative stool. Prostate smooth and firm    Musculoskeletal exam: Full ROM of spine, hips , shoulders and knees. No deformity ,swelling or crepitus noted. No muscle wasting or atrophy.   Neurologic: Cranial nerves 2 to 12 intact. Power, tone ,sensation and reflexes normal throughout. No disturbance in gait. No tremor.  Skin: Intact, no ulceration, erythema , scaling or rash noted. Pigmentation normal throughout  Psych; Normal mood and affect. Judgement and concentration normal   Assessment & Plan:  Annual physical exam Annual exam as documented.  Immunization and cancer screening needs are specifically addressed at this visit.

## 2017-08-26 NOTE — Patient Instructions (Signed)
Annual wellness with nurse inMay  mD f/u in September, call if you need me sooner  Labs today  Thank you  for choosing [facility]. We consider it a privelige to serve you.  Delivering excellent health care in a caring and  compassionate way is our goal.  Partnering with you,  so that together we can achieve this goal is our strategy.

## 2017-09-17 ENCOUNTER — Telehealth: Payer: Self-pay | Admitting: Family Medicine

## 2017-09-17 ENCOUNTER — Other Ambulatory Visit: Payer: Self-pay

## 2017-09-17 MED ORDER — CLOPIDOGREL BISULFATE 75 MG PO TABS: 75.0000 mg | ORAL_TABLET | Freq: Every day | ORAL | 3 refills | Status: DC

## 2017-09-17 MED ORDER — METOPROLOL TARTRATE 25 MG PO TABS: 25.0000 mg | ORAL_TABLET | Freq: Two times a day (BID) | ORAL | 3 refills | Status: DC

## 2017-09-17 MED ORDER — TRIAMTERENE-HCTZ 75-50 MG PO TABS: 1.0000 | ORAL_TABLET | Freq: Every day | ORAL | 3 refills | Status: DC

## 2017-09-17 MED ORDER — MONTELUKAST SODIUM 10 MG PO TABS: 10.0000 mg | ORAL_TABLET | Freq: Every day | ORAL | 3 refills | Status: DC

## 2017-09-17 MED ORDER — ATORVASTATIN CALCIUM 40 MG PO TABS: ORAL_TABLET | ORAL | 3 refills | Status: DC

## 2017-09-17 MED ORDER — LISINOPRIL 10 MG PO TABS: 10.0000 mg | ORAL_TABLET | Freq: Every day | ORAL | 3 refills | Status: DC

## 2017-09-17 NOTE — Progress Notes (Signed)
meds resent with note to pharmacy to deliver them asap

## 2017-09-17 NOTE — Telephone Encounter (Signed)
Patient states he has not received any of his medications from [facility] this month. Says he takes 7 different medications. I only see 3 in his list that were confirmed by pharmacy on 08/31/17. Let me know if you need me to do anything

## 2017-09-23 ENCOUNTER — Telehealth: Payer: Self-pay | Admitting: Family Medicine

## 2017-09-23 ENCOUNTER — Other Ambulatory Visit: Payer: Self-pay

## 2017-09-23 ENCOUNTER — Other Ambulatory Visit: Payer: Self-pay | Admitting: Adult Health

## 2017-09-23 MED ORDER — TRIAMTERENE-HCTZ 75-50 MG PO TABS: 1.0000 | ORAL_TABLET | Freq: Every day | ORAL | 3 refills | Status: DC

## 2017-09-23 MED ORDER — CLOPIDOGREL BISULFATE 75 MG PO TABS: 75.0000 mg | ORAL_TABLET | Freq: Every day | ORAL | 3 refills | Status: DC

## 2017-09-23 MED ORDER — METOPROLOL TARTRATE 25 MG PO TABS: 25.0000 mg | ORAL_TABLET | Freq: Two times a day (BID) | ORAL | 3 refills | Status: DC

## 2017-09-23 MED ORDER — LISINOPRIL 10 MG PO TABS: 10.0000 mg | ORAL_TABLET | Freq: Every day | ORAL | 3 refills | Status: DC

## 2017-09-23 MED ORDER — ATORVASTATIN CALCIUM 40 MG PO TABS: ORAL_TABLET | ORAL | 3 refills | Status: DC

## 2017-09-23 MED ORDER — MONTELUKAST SODIUM 10 MG PO TABS: 10.0000 mg | ORAL_TABLET | Freq: Every day | ORAL | 3 refills | Status: DC

## 2017-09-23 NOTE — Telephone Encounter (Signed)
Pt needs ALL MEDICINE sent to [facility] in [place]--He has received a Override--Please let me know so I can call him back

## 2017-09-23 NOTE — Telephone Encounter (Signed)
Pt called in and UPSET that he has not had his medicine in 2 months .He wants to get his medicine at [facility]  But cant until the 20th as the insurance is blocked,. But he states he is not getting the RX via mail..  I called [employer], They advised that the PT was receiving his medicine via  [place].  I called [facility], to discuss the issue, and they refused to talk to me and hung up.   I called PT back and left him a message to call [place]

## 2017-09-24 NOTE — Telephone Encounter (Signed)
I have sent meds to [name] and completed PA on lipitor and plavix

## 2017-09-28 DIAGNOSIS — Z7982 Long term (current) use of aspirin: Secondary | ICD-10-CM | POA: Diagnosis not present

## 2017-09-28 DIAGNOSIS — Z9114 Patient's other noncompliance with medication regimen: Secondary | ICD-10-CM | POA: Diagnosis not present

## 2017-09-28 DIAGNOSIS — I25119 Atherosclerotic heart disease of native coronary artery with unspecified angina pectoris: Secondary | ICD-10-CM | POA: Diagnosis not present

## 2017-09-28 DIAGNOSIS — R42 Dizziness and giddiness: Secondary | ICD-10-CM | POA: Diagnosis not present

## 2017-09-28 DIAGNOSIS — Z952 Presence of prosthetic heart valve: Secondary | ICD-10-CM | POA: Diagnosis not present

## 2017-09-28 DIAGNOSIS — T82857A Stenosis of cardiac prosthetic devices, implants and grafts, initial encounter: Secondary | ICD-10-CM | POA: Diagnosis not present

## 2017-09-28 DIAGNOSIS — I2510 Atherosclerotic heart disease of native coronary artery without angina pectoris: Secondary | ICD-10-CM | POA: Diagnosis not present

## 2017-09-28 DIAGNOSIS — I5043 Acute on chronic combined systolic (congestive) and diastolic (congestive) heart failure: Secondary | ICD-10-CM | POA: Diagnosis not present

## 2017-09-28 DIAGNOSIS — Z86718 Personal history of other venous thrombosis and embolism: Secondary | ICD-10-CM | POA: Diagnosis not present

## 2017-09-28 DIAGNOSIS — E785 Hyperlipidemia, unspecified: Secondary | ICD-10-CM | POA: Diagnosis not present

## 2017-09-28 DIAGNOSIS — I340 Nonrheumatic mitral (valve) insufficiency: Secondary | ICD-10-CM | POA: Diagnosis not present

## 2017-09-28 DIAGNOSIS — I248 Other forms of acute ischemic heart disease: Secondary | ICD-10-CM | POA: Diagnosis not present

## 2017-09-28 DIAGNOSIS — I351 Nonrheumatic aortic (valve) insufficiency: Secondary | ICD-10-CM | POA: Diagnosis not present

## 2017-09-28 DIAGNOSIS — Z9861 Coronary angioplasty status: Secondary | ICD-10-CM | POA: Diagnosis not present

## 2017-09-28 DIAGNOSIS — J810 Acute pulmonary edema: Secondary | ICD-10-CM | POA: Diagnosis not present

## 2017-09-28 DIAGNOSIS — I209 Angina pectoris, unspecified: Secondary | ICD-10-CM | POA: Diagnosis not present

## 2017-09-28 DIAGNOSIS — Z95810 Presence of automatic (implantable) cardiac defibrillator: Secondary | ICD-10-CM | POA: Diagnosis not present

## 2017-09-28 DIAGNOSIS — R0789 Other chest pain: Secondary | ICD-10-CM | POA: Diagnosis not present

## 2017-09-28 DIAGNOSIS — I517 Cardiomegaly: Secondary | ICD-10-CM | POA: Diagnosis not present

## 2017-09-28 DIAGNOSIS — I110 Hypertensive heart disease with heart failure: Secondary | ICD-10-CM | POA: Diagnosis not present

## 2017-09-28 DIAGNOSIS — T8203XA Leakage of heart valve prosthesis, initial encounter: Secondary | ICD-10-CM | POA: Diagnosis not present

## 2017-09-28 DIAGNOSIS — Z9119 Patient's noncompliance with other medical treatment and regimen: Secondary | ICD-10-CM | POA: Diagnosis not present

## 2017-09-28 DIAGNOSIS — I350 Nonrheumatic aortic (valve) stenosis: Secondary | ICD-10-CM | POA: Diagnosis not present

## 2017-09-28 DIAGNOSIS — R7989 Other specified abnormal findings of blood chemistry: Secondary | ICD-10-CM | POA: Diagnosis not present

## 2017-09-28 DIAGNOSIS — R7881 Bacteremia: Secondary | ICD-10-CM | POA: Diagnosis not present

## 2017-09-28 DIAGNOSIS — R918 Other nonspecific abnormal finding of lung field: Secondary | ICD-10-CM | POA: Diagnosis not present

## 2017-09-28 DIAGNOSIS — I509 Heart failure, unspecified: Secondary | ICD-10-CM | POA: Diagnosis not present

## 2017-09-28 DIAGNOSIS — I440 Atrioventricular block, first degree: Secondary | ICD-10-CM | POA: Diagnosis not present

## 2017-09-28 DIAGNOSIS — Z7902 Long term (current) use of antithrombotics/antiplatelets: Secondary | ICD-10-CM | POA: Diagnosis not present

## 2017-09-28 DIAGNOSIS — I444 Left anterior fascicular block: Secondary | ICD-10-CM | POA: Diagnosis not present

## 2017-09-28 DIAGNOSIS — R9431 Abnormal electrocardiogram [ECG] [EKG]: Secondary | ICD-10-CM | POA: Diagnosis not present

## 2017-09-28 DIAGNOSIS — Z955 Presence of coronary angioplasty implant and graft: Secondary | ICD-10-CM | POA: Diagnosis not present

## 2017-09-28 DIAGNOSIS — R079 Chest pain, unspecified: Secondary | ICD-10-CM | POA: Diagnosis not present

## 2017-09-28 DIAGNOSIS — R0602 Shortness of breath: Secondary | ICD-10-CM | POA: Diagnosis not present

## 2017-09-28 DIAGNOSIS — E871 Hypo-osmolality and hyponatremia: Secondary | ICD-10-CM | POA: Diagnosis not present

## 2017-09-28 DIAGNOSIS — T82897A Other specified complication of cardiac prosthetic devices, implants and grafts, initial encounter: Secondary | ICD-10-CM | POA: Diagnosis not present

## 2017-09-28 DIAGNOSIS — Z953 Presence of xenogenic heart valve: Secondary | ICD-10-CM | POA: Diagnosis not present

## 2017-09-28 DIAGNOSIS — I700 Atherosclerosis of aorta: Secondary | ICD-10-CM | POA: Diagnosis not present

## 2017-09-28 DIAGNOSIS — I5021 Acute systolic (congestive) heart failure: Secondary | ICD-10-CM | POA: Diagnosis not present

## 2017-09-29 DIAGNOSIS — R9431 Abnormal electrocardiogram [ECG] [EKG]: Secondary | ICD-10-CM | POA: Diagnosis not present

## 2017-09-29 DIAGNOSIS — R079 Chest pain, unspecified: Secondary | ICD-10-CM | POA: Diagnosis not present

## 2017-09-29 DIAGNOSIS — I440 Atrioventricular block, first degree: Secondary | ICD-10-CM | POA: Diagnosis not present

## 2017-10-02 DIAGNOSIS — I351 Nonrheumatic aortic (valve) insufficiency: Secondary | ICD-10-CM | POA: Insufficient documentation

## 2017-10-02 MED ORDER — LISINOPRIL 20 MG PO TABS
20.00 | ORAL_TABLET | ORAL | Status: DC
Start: 2017-10-03 — End: 2017-10-02

## 2017-10-02 MED ORDER — ASPIRIN EC 81 MG PO TBEC
81.00 | DELAYED_RELEASE_TABLET | ORAL | Status: DC
Start: 2017-10-03 — End: 2017-10-02

## 2017-10-02 MED ORDER — HEPARIN SODIUM (PORCINE) 5000 UNIT/ML IJ SOLN
5000.00 | INTRAMUSCULAR | Status: DC
Start: 2017-10-02 — End: 2017-10-02

## 2017-10-02 MED ORDER — PRAVASTATIN SODIUM 40 MG PO TABS
80.00 | ORAL_TABLET | ORAL | Status: DC
Start: 2017-10-02 — End: 2017-10-02

## 2017-10-02 MED ORDER — MONTELUKAST SODIUM 10 MG PO TABS
10.00 | ORAL_TABLET | ORAL | Status: DC
Start: 2017-10-02 — End: 2017-10-02

## 2017-10-14 DIAGNOSIS — J90 Pleural effusion, not elsewhere classified: Secondary | ICD-10-CM | POA: Diagnosis not present

## 2017-10-14 DIAGNOSIS — E559 Vitamin D deficiency, unspecified: Secondary | ICD-10-CM | POA: Diagnosis not present

## 2017-10-14 DIAGNOSIS — I351 Nonrheumatic aortic (valve) insufficiency: Secondary | ICD-10-CM | POA: Diagnosis not present

## 2017-10-14 DIAGNOSIS — Z7901 Long term (current) use of anticoagulants: Secondary | ICD-10-CM | POA: Diagnosis not present

## 2017-10-14 DIAGNOSIS — R9431 Abnormal electrocardiogram [ECG] [EKG]: Secondary | ICD-10-CM | POA: Diagnosis not present

## 2017-10-14 DIAGNOSIS — I110 Hypertensive heart disease with heart failure: Secondary | ICD-10-CM | POA: Diagnosis not present

## 2017-10-14 DIAGNOSIS — I5023 Acute on chronic systolic (congestive) heart failure: Secondary | ICD-10-CM | POA: Diagnosis not present

## 2017-10-14 DIAGNOSIS — Z86718 Personal history of other venous thrombosis and embolism: Secondary | ICD-10-CM | POA: Diagnosis not present

## 2017-10-14 DIAGNOSIS — J9811 Atelectasis: Secondary | ICD-10-CM | POA: Diagnosis not present

## 2017-10-14 DIAGNOSIS — I440 Atrioventricular block, first degree: Secondary | ICD-10-CM | POA: Diagnosis not present

## 2017-10-14 DIAGNOSIS — Z955 Presence of coronary angioplasty implant and graft: Secondary | ICD-10-CM | POA: Diagnosis not present

## 2017-10-14 DIAGNOSIS — I444 Left anterior fascicular block: Secondary | ICD-10-CM | POA: Diagnosis not present

## 2017-10-14 DIAGNOSIS — R069 Unspecified abnormalities of breathing: Secondary | ICD-10-CM | POA: Diagnosis not present

## 2017-10-14 DIAGNOSIS — I350 Nonrheumatic aortic (valve) stenosis: Secondary | ICD-10-CM | POA: Diagnosis not present

## 2017-10-14 DIAGNOSIS — I517 Cardiomegaly: Secondary | ICD-10-CM | POA: Diagnosis not present

## 2017-10-14 DIAGNOSIS — J449 Chronic obstructive pulmonary disease, unspecified: Secondary | ICD-10-CM | POA: Diagnosis not present

## 2017-10-14 DIAGNOSIS — I4510 Unspecified right bundle-branch block: Secondary | ICD-10-CM | POA: Diagnosis not present

## 2017-10-14 DIAGNOSIS — K4090 Unilateral inguinal hernia, without obstruction or gangrene, not specified as recurrent: Secondary | ICD-10-CM | POA: Diagnosis not present

## 2017-10-14 DIAGNOSIS — I25110 Atherosclerotic heart disease of native coronary artery with unstable angina pectoris: Secondary | ICD-10-CM | POA: Diagnosis not present

## 2017-10-14 DIAGNOSIS — R7989 Other specified abnormal findings of blood chemistry: Secondary | ICD-10-CM | POA: Diagnosis not present

## 2017-10-14 DIAGNOSIS — T82897A Other specified complication of cardiac prosthetic devices, implants and grafts, initial encounter: Secondary | ICD-10-CM | POA: Diagnosis not present

## 2017-10-14 DIAGNOSIS — Z7982 Long term (current) use of aspirin: Secondary | ICD-10-CM | POA: Diagnosis not present

## 2017-10-14 DIAGNOSIS — Z7902 Long term (current) use of antithrombotics/antiplatelets: Secondary | ICD-10-CM | POA: Diagnosis not present

## 2017-10-14 DIAGNOSIS — I248 Other forms of acute ischemic heart disease: Secondary | ICD-10-CM | POA: Diagnosis not present

## 2017-10-14 DIAGNOSIS — R918 Other nonspecific abnormal finding of lung field: Secondary | ICD-10-CM | POA: Diagnosis not present

## 2017-10-14 DIAGNOSIS — T8203XA Leakage of heart valve prosthesis, initial encounter: Secondary | ICD-10-CM | POA: Diagnosis not present

## 2017-10-14 DIAGNOSIS — E785 Hyperlipidemia, unspecified: Secondary | ICD-10-CM | POA: Diagnosis not present

## 2017-10-14 DIAGNOSIS — Z952 Presence of prosthetic heart valve: Secondary | ICD-10-CM | POA: Diagnosis not present

## 2017-10-14 DIAGNOSIS — T8209XA Other mechanical complication of heart valve prosthesis, initial encounter: Secondary | ICD-10-CM | POA: Diagnosis not present

## 2017-10-14 DIAGNOSIS — R0602 Shortness of breath: Secondary | ICD-10-CM | POA: Diagnosis not present

## 2017-10-14 DIAGNOSIS — Z006 Encounter for examination for normal comparison and control in clinical research program: Secondary | ICD-10-CM | POA: Diagnosis not present

## 2017-10-16 DIAGNOSIS — R0602 Shortness of breath: Secondary | ICD-10-CM | POA: Insufficient documentation

## 2017-10-22 MED ORDER — NITROGLYCERIN 0.4 MG SL SUBL
0.40 | SUBLINGUAL_TABLET | SUBLINGUAL | Status: DC
Start: ? — End: 2017-10-22

## 2017-10-22 MED ORDER — LISINOPRIL 20 MG PO TABS
20.00 | ORAL_TABLET | ORAL | Status: DC
Start: 2017-10-23 — End: 2017-10-22

## 2017-10-22 MED ORDER — CHOLECALCIFEROL 25 MCG (1000 UT) PO TABS
1000.00 | ORAL_TABLET | ORAL | Status: DC
Start: 2017-10-23 — End: 2017-10-22

## 2017-10-22 MED ORDER — ASPIRIN EC 81 MG PO TBEC
81.00 | DELAYED_RELEASE_TABLET | ORAL | Status: DC
Start: 2017-10-23 — End: 2017-10-22

## 2017-10-22 MED ORDER — MONTELUKAST SODIUM 10 MG PO TABS
10.00 | ORAL_TABLET | ORAL | Status: DC
Start: 2017-10-22 — End: 2017-10-22

## 2017-10-22 MED ORDER — OXYCODONE HCL 5 MG PO TABS
5.00 | ORAL_TABLET | ORAL | Status: DC
Start: ? — End: 2017-10-22

## 2017-10-22 MED ORDER — ROSUVASTATIN CALCIUM 5 MG PO TABS
10.00 | ORAL_TABLET | ORAL | Status: DC
Start: 2017-10-22 — End: 2017-10-22

## 2017-10-22 MED ORDER — CLOPIDOGREL BISULFATE 75 MG PO TABS
75.00 | ORAL_TABLET | ORAL | Status: DC
Start: 2017-10-23 — End: 2017-10-22

## 2017-10-25 ENCOUNTER — Telehealth: Payer: Self-pay | Admitting: Family Medicine

## 2017-10-25 NOTE — Telephone Encounter (Signed)
Transition Care Management Follow-up Telephone Call   Date discharged?  10/22/17              How have you been since you were released from the hospital? Patient states he is doing fine, just moving a little slower than usual   Do you understand why you were in the hospital? Yes   Do you understand the discharge instructions?  Yes   Where were you discharged to? Home   Items Reviewed:  Medications reviewed: Yes  Allergies reviewed: Yes  Dietary changes reviewed: Yes, patient was placed on low sodium diet, advised to limit water intake  Referrals reviewed: Yes   Functional Questionnaire:   Activities of Daily Living (ADLs):  Patient states he is independent in all ADLs    Any transportation issues/concerns?: Not at this time   Any patient concerns? Not at this time   Confirmed importance and date/time of follow-up visits scheduled   Yes. Patient was scheduled 11/01/17 per Dr. Moshe Cipro.   Confirmed with patient if condition begins to worsen call PCP or go to the ER.  Patient was given the office number and encouraged to call back with question or concerns.  : Yes

## 2017-10-26 ENCOUNTER — Ambulatory Visit: Payer: Self-pay | Admitting: Family Medicine

## 2017-11-01 ENCOUNTER — Ambulatory Visit: Payer: Medicare Other | Admitting: Family Medicine

## 2017-11-01 ENCOUNTER — Encounter: Payer: Self-pay | Admitting: Family Medicine

## 2017-11-01 VITALS — BP 104/70 | HR 57 | Resp 16 | Ht 64.0 in | Wt 112.0 lb

## 2017-11-01 DIAGNOSIS — E785 Hyperlipidemia, unspecified: Secondary | ICD-10-CM

## 2017-11-01 DIAGNOSIS — I2510 Atherosclerotic heart disease of native coronary artery without angina pectoris: Secondary | ICD-10-CM

## 2017-11-01 DIAGNOSIS — Z09 Encounter for follow-up examination after completed treatment for conditions other than malignant neoplasm: Secondary | ICD-10-CM

## 2017-11-01 DIAGNOSIS — Z952 Presence of prosthetic heart valve: Secondary | ICD-10-CM | POA: Diagnosis not present

## 2017-11-01 MED ORDER — METOPROLOL SUCCINATE ER 50 MG PO TB24: 50.0000 mg | ORAL_TABLET | Freq: Every day | ORAL | 3 refills | Status: DC

## 2017-11-01 MED ORDER — FUROSEMIDE 40 MG PO TABS
40.0000 mg | ORAL_TABLET | Freq: Every day | ORAL | 3 refills | Status: DC
Start: 2017-11-01 — End: 2018-11-11

## 2017-11-01 MED ORDER — VITAMIN D3 25 MCG (1000 UNIT) PO TABS: 1000.0000 [IU] | ORAL_TABLET | Freq: Every day | ORAL | 3 refills | Status: AC

## 2017-11-01 MED ORDER — ASPIRIN EC 81 MG PO TBEC: 81.0000 mg | DELAYED_RELEASE_TABLET | Freq: Every day | ORAL | 3 refills | Status: AC

## 2017-11-01 MED ORDER — CLOPIDOGREL BISULFATE 75 MG PO TABS
75.0000 mg | ORAL_TABLET | Freq: Every day | ORAL | 1 refills | Status: DC
Start: 2017-11-01 — End: 2018-05-05

## 2017-11-01 MED ORDER — MONTELUKAST SODIUM 10 MG PO TABS
10.0000 mg | ORAL_TABLET | Freq: Every day | ORAL | 3 refills | Status: DC
Start: 2017-11-01 — End: 2018-11-11

## 2017-11-01 MED ORDER — LISINOPRIL 20 MG PO TABS: 20.0000 mg | ORAL_TABLET | Freq: Every day | ORAL | 3 refills | Status: DC

## 2017-11-01 MED ORDER — ATORVASTATIN CALCIUM 40 MG PO TABS: 40.0000 mg | ORAL_TABLET | Freq: Every day | ORAL | 3 refills | Status: DC

## 2017-11-01 NOTE — Patient Instructions (Addendum)
F/u in 2.5 months, call if you need me sooner  You will be referred to cardiology for follow up  Please schedule appt with surgeon at [facility];l medications as at discharge, these are sent to [facility]

## 2017-11-01 NOTE — Progress Notes (Signed)
   [name]     MRN: [id number]      DOB: [birth year]   HPI [name] is here for follow up for follow up of recent hospitalization at [facility] form 02/01  To Feb 4 , he had heart valve replaced, this after  2 hospitalizations in [place] when he presented in acute decompensated heart  Failure.on 10/23/2017 he had severe aortic insufficiency from a torn coronary cusp of bioprosthetic valve Post op pain was severe and he was found to have inguinal hernias, but he had no hematomas Today much improved , needs to re establish with local cardiology for ongoing care,, he has CAD, heart failure, valvular heart disease, hypertension, and hyperlipidemia and will call and schedule hi follow up witjh the cardiothoracic surgeom Meds are reviewed  And sent in except for NTG ROS Denies recent fever or chills.Still has fatigue with activity and is gradually regaining his exercise tolerance Denies sinus pressure, nasal congestion, ear pain or sore throat. Denies chest congestion, productive cough or wheezing. Denies chest pains, palpitations and leg swelling Denies abdominal pain, nausea, vomiting,diarrhea or constipation.   Denies dysuria, frequency, hesitancy or incontinence. Denies joint pain, swelling and limitation in mobility. Denies headaches, seizures, numbness, or tingling. Denies depression, anxiety or insomnia. Replacement of his aortic valve following 2 previous hospitalizations within 4 weeks when he presented in acute failyureDenies skin break down or rash.   PE  BP 104/70   Pulse (!) 57   Resp 16   Ht 5\' 4"  (1.626 m)   Wt 112 lb (50.8 kg)   SpO2 91%   BMI 19.22 kg/m   Patient alert and oriented and in no cardiopulmonary distress.  HEENT: No facial asymmetry, EOMI,   oropharynx pink and moist.  Neck supple no JVD, no mass.  Chest: Clear to auscultation bilaterally.  CVS: S1, S2 systolic murmur, no S3.Regular rate.  ABD: Soft non tender.   Ext: No edema  MS: Adequate ROM  spine, shoulders, hips and knees.  Skin: Intact, no ulcerations or rash noted.  Psych: Good eye contact, normal affect. Memory intact not anxious or depressed appearing.  CNS: CN 2-12 intact, power,  normal throughout.no focal deficits noted.   [facility] discharge follow-up Hospital course reviewed for most recent stay at [facility] from 02/02 to 10/25/2017 for replacement of a prosthetic aortic valve which had ruptured Medications he is currently taking are reviewed and renewed except for the NTG. He will need an asap appointment with local cardiology for ongoing care as he has CAD, CHF , HTN and valvular heart disease with valve replacements x 2 Has been lost to local follow up, also compliance with medication has been a major challenge in the past  Coronary atherosclerosis of native coronary artery Needs to re establish with cardiology  Hyperlipidemia LDL goal <70 Hyperlipidemia:Low fat diet discussed and encouraged.   Lipid Panel  Lab Results  Component Value Date   CHOL 159 08/26/2017   HDL 51 08/26/2017   LDLCALC 41 12/30/2016   TRIG 48 08/26/2017   CHOLHDL 3.1 08/26/2017  Updated lab needed at/ before next visit.    '

## 2017-11-02 ENCOUNTER — Encounter: Payer: Self-pay | Admitting: Family Medicine

## 2017-11-02 NOTE — Assessment & Plan Note (Addendum)
Needs to re establish with cardiology

## 2017-11-02 NOTE — Assessment & Plan Note (Signed)
Hospital course reviewed for most recent stay at [facility] from 02/02 to 10/25/2017 for replacement of a prosthetic aortic valve which had ruptured Medications he is currently taking are reviewed and renewed except for the NTG. He will need an asap appointment with local cardiology for ongoing care as he has CAD, CHF , HTN and valvular heart disease with valve replacements x 2 Has been lost to local follow up, also compliance with medication has been a major challenge in the past

## 2017-11-02 NOTE — Assessment & Plan Note (Signed)
Hyperlipidemia:Low fat diet discussed and encouraged.   Lipid Panel  Lab Results  Component Value Date   CHOL 159 08/26/2017   HDL 51 08/26/2017   LDLCALC 41 12/30/2016   TRIG 48 08/26/2017   CHOLHDL 3.1 08/26/2017  Updated lab needed at/ before next visit.    '

## 2017-11-23 NOTE — Progress Notes (Signed)
Cardiology Office Note  Date: 11/24/2017   ID: Cruise, Baumgardner 1951/09/19, MRN [id number]  PCP: [name], MD  Primary Cardiologist: Rozann Lesches, MD   Chief Complaint  Patient presents with  . Aortic valve disease  . Cardiomyopathy    History of Present Illness: [name] is a medically complex 67 y.o. male that I have not seen in the office since October 2017.  He is referred back to the office by Dr. Moshe Cipro to reestablish care in the setting of noncompliance with follow-up.  I reviewed extensive interval records.  Patient was hospitalized in January of this year at [facility] with acute decompensated heart failure and LVEF 20-25% range (new) complicated by severe aortic insufficiency due to torn coronary cusp of his bioprosthetic AVR.  He underwent TAVR during that hospital stay with a 26 mm Sapien valve (complicated procedure per notes).  He was placed on aspirin and Plavix.  He tells me that he has been feeling better, shortness of breath has improved significantly, he denies fevers and chills, no angina symptoms.  We went through all of his medications today and will plan to continue his most recent regimen following discharge from the hospital.  He does not report any bleeding problems on aspirin and Plavix.  Heart rate is well controlled today as his blood pressure.  I personally reviewed his ECG today which shows sinus bradycardia with prolonged PR interval,, nonspecific ST-T changes.  I talked with him about his cardiomyopathy and plan to reassess echocardiogram ultimately.  Past Medical History:  Diagnosis Date  . Arthritis   . CAD (coronary artery disease)    a. s/p PCI with 3.5 x 18 BMS to mid RCA, non obst LAD, Lcx patent 08/10/13  . Depression   . Embolus of femoral artery [facility]) 2012   Surgical repair, reason for lifelong coumadin  . Essential hypertension, benign   . ETOH abuse   . Headache(784.0)   . History of DVT (deep vein thrombosis)  2011  . History of GI bleed    February 2012   . 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 Prosthetic valve endocarditis ([place])   . S/P aortic valve replacement    Details not clear -  reportedly 1971 per patient    Past Surgical History:  Procedure Laterality Date  . Aoric valve replacement  January 2005   64mm Medtronic Freestyle stentless porcine bioprosthesis [facility])  . CARDIAC CATHETERIZATION     30-40% mid LAD, 20-30% ostial D1, proximal 30-40% RCA, 95% mid RCA thrombus, 40% distal stenosis. EF 45%, inferior HK.  [name] COLONOSCOPY N/A 04/20/2014   Procedure: COLONOSCOPY;  Surgeon: Rogene Houston, MD;  Location: [facility] ENDO SUITE;  Service: Endoscopy;  Laterality: N/A;  930  . CORONARY ANGIOPLASTY WITH STENT PLACEMENT  07/2013  . LEFT HEART CATHETERIZATION WITH CORONARY ANGIOGRAM N/A 08/10/2013   Procedure: LEFT HEART CATHETERIZATION WITH CORONARY ANGIOGRAM;  Surgeon: Blane Ohara, MD;  Location: [facility] CATH LAB;  Service: Cardiovascular;  Laterality: N/A;  . Left leg embolectomy  May 2012   Dr. Trula Slade - reason for lifelong coumadin    Current Outpatient Medications  Medication Sig Dispense Refill  . aspirin EC 81 MG tablet Take 1 tablet (81 mg total) by mouth daily. 90 tablet 3  . atorvastatin (LIPITOR) 40 MG tablet Take 1 tablet (40  mg total) by mouth daily. 90 tablet 3  . cholecalciferol (VITAMIN D) 1000 units tablet Take 1 tablet (1,000 Units total) by mouth daily. 90 tablet 3  . clopidogrel (PLAVIX) 75 MG tablet Take 1 tablet (75 mg total) by mouth daily. 90 tablet 1  . furosemide (LASIX) 40 MG tablet Take 1 tablet (40 mg total) by mouth daily. 90 tablet 3  . lisinopril (PRINIVIL,ZESTRIL) 20 MG tablet Take 1 tablet (20 mg total) by mouth daily. 90 tablet 3  . metoprolol succinate (TOPROL-XL) 50 MG 24 hr tablet Take 1 tablet (50 mg total) by mouth daily. Take with or  immediately following a meal. 90 tablet 3  . montelukast (SINGULAIR) 10 MG tablet Take 1 tablet (10 mg total) by mouth at bedtime. 90 tablet 3  . Naproxen Sodium (ALEVE) 220 MG CAPS Take 1 capsule by mouth 2 (two) times daily.    . nitroGLYCERIN (NITROSTAT) 0.4 MG SL tablet DISSOLVE ONE TABLET UNDER THE TONGUE EVERY 5 MINUTES AS NEEDED FOR CHEST PAIN.  DO NOT EXCEED A TOTAL OF 3 DOSES IN 15 MINUTES 25 tablet 3  . oxyCODONE (OXY IR/ROXICODONE) 5 MG immediate release tablet Take 5 mg by mouth every 6 (six) hours as needed for severe pain.     No current facility-administered medications for this visit.    Allergies:  Patient has no known allergies.   Social History: The patient  reports that he quit smoking about 47 years ago. His smoking use included cigarettes. He started smoking about 52 years ago. He has a 1.50 pack-year smoking history. he has never used smokeless tobacco. He reports that he does not drink alcohol or use drugs.  Family History: The patient's family history includes COPD in his sister; Cirrhosis in his father; Diabetes in his mother.   ROS:  Please see the history of present illness. Otherwise, complete review of systems is positive for improved shortness of breath.  All other systems are reviewed and negative.   Physical Exam: VS:  BP 122/82   Pulse 67   Ht 5\' 4"  (1.626 m)   Wt 119 lb (54 kg)   SpO2 98%   BMI 20.43 kg/m , BMI Body mass index is 20.43 kg/m.  Wt Readings from Last 3 Encounters:  11/24/17 119 lb (54 kg)  11/01/17 112 lb (50.8 kg)  08/26/17 117 lb (53.1 kg)    General: Patient appears comfortable at rest. HEENT: Conjunctiva and lids normal, oropharynx with poor dentition.  Wearing glasses. Neck: Supple, no carotid bruits, no thyromegaly. Lungs: No wheezing or crackles, nonlabored breathing at rest. Cardiac: Regular rate and rhythm, no S3  2/6 systolic murmur 2/6 diastolic murmur mainly heard toward the apex, no pericardial rub. Abdomen: Soft,  nontender, bowel sounds present, no guarding or rebound. Extremities: No pitting edema, distal pulses 2+. Skin: Warm and dry. Musculoskeletal: No kyphosis. Neuropsychiatric: Alert and oriented x3, affect grossly appropriate.  ECG: I personally reviewed the tracing from 07/20/2016 which showed sinus bradycardia with prolonged PR interval, left anterior fascicular block and nonspecific T wave changes.  Recent Labwork: 12/30/2016: Hemoglobin 13.5; Platelets 266 08/26/2017: ALT 9; AST 16; BUN 21; Creat 0.99; Potassium 4.7; Sodium 130     Component Value Date/Time   CHOL 159 08/26/2017 1126   TRIG 48 08/26/2017 1126   HDL 51 08/26/2017 1126   CHOLHDL 3.1 08/26/2017 1126   VLDL 12 12/30/2016 0853   LDLCALC 41 12/30/2016 0853  February 2019: Potassium 4.4, BUN 18, creatinine 0.94, hemoglobin 11.8,  platelets 157  Other Studies Reviewed Today:  Echocardiogram 10/21/2017 [facility]): TTE complete (10/21/17) SUMMARY The left ventricle is moderately dilated with normal left ventricular wall  thickness. Left ventricular systolic function is moderate to severely reduced; LVEF =  25-30%. Left ventricular filling pattern is prolonged relaxation. There is  moderate to severe global hypokinesis of the left ventricle. The right ventricle is mildly dilated with normal systolic function. Well-seated TAVR aortic prosthesis with no significant stenosis; peak  velocity 2.6 m/s, mean gradient 16 mmHg. Trace to mild paravalvular aortic  regurgitation. Compared to the limited prior study (after Sapien deployment) dated  10/20/2017, the LV function is more robust and the AI looks significantly  improved.  Assessment and Plan:  1.  Valvular heart disease with complex history as discussed above.  Most recently he is status post TAVR at [facility] for treatment of severe aortic regurgitation due to torn coronary cusp of his previously placed bioprosthetic AVR.  He is on aspirin and Plavix at this time and has pending  follow-up in [place] later this month.  2.  Secondary cardiomyopathy with LVEF 41-74% range complicated by severe aortic regurgitation.  Hopefully, now that he is status post TAVR and on regular medical therapy, he will have improvement in LVEF.  Follow-up echocardiogram will be obtained in the next 6 weeks with clinical visit.  3.  CAD with history of BMS to the RCA in 2014.  No active angina symptoms at this time.  4.  History of noncompliance with both medications and follow-up.  I discussed this with him today.  Current medicines were reviewed with the patient today.   Orders Placed This Encounter  Procedures  . EKG 12-Lead  . ECHOCARDIOGRAM COMPLETE    Disposition: Follow-up in 6 weeks.  Signed, Satira Sark, MD, [facility] 11/24/2017 12:40 PM    [facility], [place], [place] [address] Phone: [phone]; Fax: [phone]

## 2017-11-24 ENCOUNTER — Ambulatory Visit (INDEPENDENT_AMBULATORY_CARE_PROVIDER_SITE_OTHER): Payer: Medicare Other | Admitting: Cardiology

## 2017-11-24 ENCOUNTER — Encounter: Payer: Self-pay | Admitting: Cardiology

## 2017-11-24 VITALS — BP 122/82 | HR 67 | Ht 64.0 in | Wt 119.0 lb

## 2017-11-24 DIAGNOSIS — Z952 Presence of prosthetic heart valve: Secondary | ICD-10-CM

## 2017-11-24 DIAGNOSIS — Z91199 Patient's noncompliance with other medical treatment and regimen due to unspecified reason: Secondary | ICD-10-CM

## 2017-11-24 DIAGNOSIS — I25119 Atherosclerotic heart disease of native coronary artery with unspecified angina pectoris: Secondary | ICD-10-CM

## 2017-11-24 DIAGNOSIS — I359 Nonrheumatic aortic valve disorder, unspecified: Secondary | ICD-10-CM

## 2017-11-24 DIAGNOSIS — Z9119 Patient's noncompliance with other medical treatment and regimen: Secondary | ICD-10-CM | POA: Diagnosis not present

## 2017-11-24 DIAGNOSIS — I429 Cardiomyopathy, unspecified: Secondary | ICD-10-CM | POA: Diagnosis not present

## 2017-11-24 NOTE — Patient Instructions (Signed)
Medication Instructions:  Your physician recommends that you continue on your current medications as directed. Please refer to the Current Medication list given to you today.  Labwork: NONE  Testing/Procedures: Your physician has requested that you have an echocardiogram. Echocardiography is a painless test that uses sound waves to create images of your heart. It provides your doctor with information about the size and shape of your heart and how well your heart's chambers and valves are working. This procedure takes approximately one hour. There are no restrictions for this procedure.  Follow-Up: Your physician recommends that you schedule a follow-up appointment in: 6 WEEKS WITH DR. MCDOWELL.  Any Other Special Instructions Will Be Listed Below (If Applicable).  If you need a refill on your cardiac medications before your next appointment, please call your pharmacy. 

## 2017-12-03 DIAGNOSIS — I351 Nonrheumatic aortic (valve) insufficiency: Secondary | ICD-10-CM | POA: Diagnosis not present

## 2017-12-03 DIAGNOSIS — J988 Other specified respiratory disorders: Secondary | ICD-10-CM | POA: Diagnosis not present

## 2017-12-27 ENCOUNTER — Ambulatory Visit (HOSPITAL_COMMUNITY)
Admission: RE | Admit: 2017-12-27 | Discharge: 2017-12-27 | Disposition: A | Discharge status: Home / Self Care | Payer: Medicare Other | Source: Ambulatory Visit | Attending: Cardiology | Admitting: Cardiology

## 2017-12-27 DIAGNOSIS — I2510 Atherosclerotic heart disease of native coronary artery without angina pectoris: Secondary | ICD-10-CM | POA: Diagnosis not present

## 2017-12-27 DIAGNOSIS — I10 Essential (primary) hypertension: Secondary | ICD-10-CM | POA: Insufficient documentation

## 2017-12-27 DIAGNOSIS — E785 Hyperlipidemia, unspecified: Secondary | ICD-10-CM | POA: Diagnosis not present

## 2017-12-27 DIAGNOSIS — F1010 Alcohol abuse, uncomplicated: Secondary | ICD-10-CM | POA: Insufficient documentation

## 2017-12-27 DIAGNOSIS — Z86718 Personal history of other venous thrombosis and embolism: Secondary | ICD-10-CM | POA: Diagnosis not present

## 2017-12-27 DIAGNOSIS — Z48812 Encounter for surgical aftercare following surgery on the circulatory system: Secondary | ICD-10-CM | POA: Insufficient documentation

## 2017-12-27 DIAGNOSIS — I083 Combined rheumatic disorders of mitral, aortic and tricuspid valves: Secondary | ICD-10-CM | POA: Insufficient documentation

## 2017-12-27 DIAGNOSIS — I429 Cardiomyopathy, unspecified: Secondary | ICD-10-CM | POA: Diagnosis not present

## 2017-12-27 DIAGNOSIS — Z952 Presence of prosthetic heart valve: Secondary | ICD-10-CM | POA: Insufficient documentation

## 2017-12-27 NOTE — Progress Notes (Signed)
*  PRELIMINARY RESULTS* Echocardiogram 2D Echocardiogram has been performed.  [name] 12/27/2017, 11:19 AM

## 2018-01-10 ENCOUNTER — Encounter: Payer: Self-pay | Admitting: Cardiology

## 2018-01-10 ENCOUNTER — Ambulatory Visit (INDEPENDENT_AMBULATORY_CARE_PROVIDER_SITE_OTHER): Payer: Medicare Other | Admitting: Cardiology

## 2018-01-10 VITALS — BP 138/80 | HR 56 | Ht 64.0 in | Wt 118.0 lb

## 2018-01-10 DIAGNOSIS — Z9119 Patient's noncompliance with other medical treatment and regimen: Secondary | ICD-10-CM

## 2018-01-10 DIAGNOSIS — Z952 Presence of prosthetic heart valve: Secondary | ICD-10-CM

## 2018-01-10 DIAGNOSIS — I429 Cardiomyopathy, unspecified: Secondary | ICD-10-CM

## 2018-01-10 DIAGNOSIS — Z91199 Patient's noncompliance with other medical treatment and regimen due to unspecified reason: Secondary | ICD-10-CM

## 2018-01-10 DIAGNOSIS — I25119 Atherosclerotic heart disease of native coronary artery with unspecified angina pectoris: Secondary | ICD-10-CM | POA: Diagnosis not present

## 2018-01-10 NOTE — Patient Instructions (Addendum)
Your physician wants you to follow-up in: 6 months with Dr.McDowell You will receive a reminder letter in the mail two months in advance. If you don't receive a letter, please call our office to schedule the follow-up appointment.     Your physician recommends that you continue on your current medications as directed. Please refer to the Current Medication list given to you today.   If you need a refill on your cardiac medications before your next appointment, please call your pharmacy.     No lab work or tests today.     Thank you for choosing [facility] !        

## 2018-01-10 NOTE — Progress Notes (Signed)
Cardiology Office Note  Date: 01/10/2018   ID: [name], [name] [birth year], MRN [id number]  PCP: [name], MD  Primary Cardiologist: Rozann Lesches, MD   Chief Complaint  Patient presents with  . Cardiomyopathy  . Aortic valve disease    History of Present Illness: [name] is a medically complex 67 y.o. male last seen in March.  He presents for a follow-up visit.  States that he feels well, denies any exertional chest pain, describes NYHA class I-II dyspnea, no palpitations or syncope.  He denies fevers or chills.  Patient was hospitalized in January of this year at [facility] with acute decompensated heart failure and LVEF 20-25% range (new) complicated by severe aortic insufficiency due to torn coronary cusp of his bioprosthetic AVR.  He underwent TAVR during that hospital stay with a 26 mm Sapien valve (complicated procedure per notes).  He was placed on aspirin and Plavix.  Follow-up echocardiogram done in April shows LVEF 40-45% range with mild diastolic dysfunction and TAVR bioprosthesis in position with mild perivalvular regurgitation and mean gradient 7 mmHg.  Also has mildly reduced right ventricular contraction and PASP 32 mmHg.  I reviewed the results with him today.  I went over his medications which are outlined below.  He states that he has been compliant.  He has not used any nitroglycerin.   Past Medical History:  Diagnosis Date  . Arthritis   . CAD (coronary artery disease)    a. s/p PCI with 3.5 x 18 BMS to mid RCA, non obst LAD, Lcx patent 08/10/13  . Depression   . Embolus of femoral artery [facility]) 2012   Surgical repair, previously on Coumadin  . Essential hypertension, benign   . ETOH abuse   . Headache(784.0)   . History of DVT (deep vein thrombosis) 2011  . History of GI bleed    February 2012   . 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 Prosthetic valve endocarditis ([place])   . S/P aortic valve replacement    Details not clear -  reportedly 1971 per patient    Past Surgical History:  Procedure Laterality Date  . Aoric valve replacement  January 2005   71mm Medtronic Freestyle stentless porcine bioprosthesis [facility])  . CARDIAC CATHETERIZATION     30-40% mid LAD, 20-30% ostial D1, proximal 30-40% RCA, 95% mid RCA thrombus, 40% distal stenosis. EF 45%, inferior HK.  [name] COLONOSCOPY N/A 04/20/2014   Procedure: COLONOSCOPY;  Surgeon: Rogene Houston, MD;  Location: [facility];  Service: Endoscopy;  Laterality: N/A;  930  . CORONARY ANGIOPLASTY WITH STENT PLACEMENT  07/2013  . LEFT HEART CATHETERIZATION WITH CORONARY ANGIOGRAM N/A 08/10/2013   Procedure: LEFT HEART CATHETERIZATION WITH CORONARY ANGIOGRAM;  Surgeon: Blane Ohara, MD;  Location: [facility] CATH LAB;  Service: Cardiovascular;  Laterality: N/A;  . Left leg embolectomy  May 2012   Dr. Trula Slade - reason for lifelong coumadin    Current Outpatient Medications  Medication Sig Dispense Refill  . aspirin EC 81 MG tablet Take 1 tablet (81 mg total) by mouth daily. 90 tablet 3  . atorvastatin (LIPITOR) 40 MG tablet Take 1 tablet (40 mg total) by mouth daily. 90 tablet 3  . cholecalciferol (VITAMIN D) 1000 units tablet Take 1 tablet (1,000 Units total) by mouth daily. 90 tablet 3  .  clopidogrel (PLAVIX) 75 MG tablet Take 1 tablet (75 mg total) by mouth daily. 90 tablet 1  . furosemide (LASIX) 40 MG tablet Take 1 tablet (40 mg total) by mouth daily. 90 tablet 3  . lisinopril (PRINIVIL,ZESTRIL) 20 MG tablet Take 1 tablet (20 mg total) by mouth daily. 90 tablet 3  . metoprolol succinate (TOPROL-XL) 50 MG 24 hr tablet Take 1 tablet (50 mg total) by mouth daily. Take with or immediately following a meal. 90 tablet 3  . montelukast (SINGULAIR) 10 MG tablet Take 1 tablet (10 mg total) by mouth at bedtime. 90 tablet 3  . Naproxen Sodium (ALEVE) 220 MG CAPS  Take 1 capsule by mouth 2 (two) times daily.    . nitroGLYCERIN (NITROSTAT) 0.4 MG SL tablet DISSOLVE ONE TABLET UNDER THE TONGUE EVERY 5 MINUTES AS NEEDED FOR CHEST PAIN.  DO NOT EXCEED A TOTAL OF 3 DOSES IN 15 MINUTES 25 tablet 3  . oxyCODONE (OXY IR/ROXICODONE) 5 MG immediate release tablet Take 5 mg by mouth every 6 (six) hours as needed for severe pain.     No current facility-administered medications for this visit.    Allergies:  Patient has no known allergies.   Social History: The patient  reports that he quit smoking about 47 years ago. His smoking use included cigarettes. He started smoking about 52 years ago. He has a 1.50 pack-year smoking history. He has never used smokeless tobacco. He reports that he does not drink alcohol or use drugs.   ROS:  Please see the history of present illness. Otherwise, complete review of systems is positive for none.  All other systems are reviewed and negative.   Physical Exam: VS:  BP 138/80   Pulse (!) 56   Ht 5\' 4"  (1.626 m)   Wt 118 lb (53.5 kg)   SpO2 95%   BMI 20.25 kg/m , BMI Body mass index is 20.25 kg/m.  Wt Readings from Last 3 Encounters:  01/10/18 118 lb (53.5 kg)  11/24/17 119 lb (54 kg)  11/01/17 112 lb (50.8 kg)    General: Patient appears comfortable at rest. HEENT: Conjunctiva and lids normal, oropharynx clear. Neck: Supple, no elevated JVP or carotid bruits, no thyromegaly. Lungs: Clear to auscultation, nonlabored breathing at rest. Cardiac: Regular rate and rhythm, no S3, 2/6 systolic murmur. Abdomen: Soft, nontender, bowel sounds present. Extremities: No pitting edema, distal pulses 2+. Skin: Warm and dry. Musculoskeletal: No kyphosis. Neuropsychiatric: Alert and oriented x3, affect grossly appropriate.  ECG: I personally reviewed the tracing from 11/24/2017 which showed sinus bradycardia with prolonged PR interval,, nonspecific ST-T changes.  Recent Labwork: 08/26/2017: ALT 9; AST 16; BUN 21; Creat 0.99;  Potassium 4.7; Sodium 130     Component Value Date/Time   CHOL 159 08/26/2017 1126   TRIG 48 08/26/2017 1126   HDL 51 08/26/2017 1126   CHOLHDL 3.1 08/26/2017 1126   VLDL 12 12/30/2016 0853   LDLCALC 94 08/26/2017 1126    Other Studies Reviewed Today:  Echocardiogram 12/27/2017: Study Conclusions  - Left ventricle: The cavity size was normal. Wall thickness was   increased in a pattern of moderate LVH. Systolic function was   mildly to moderately reduced. The estimated ejection fraction was   in the range of 40% to 45%. Diffuse hypokinesis with some   regional variation. Doppler parameters are consistent with   abnormal left ventricular relaxation (grade 1 diastolic   dysfunction). - Aortic valve: 26 mm Sapien valve positioned within previously  placed aortic valve bioprosthesis. There was mild perivalvular   regurgitation. Mean gradient (S): 7 mm Hg. Peak gradient (S): 15   mm Hg. - Mitral valve: Mildly thickened leaflets . There was mild   regurgitation. - Left atrium: The atrium was mildly to moderately dilated. - Right ventricle: Systolic function was mildly reduced. - Right atrium: The atrium was mildly dilated. Central venous   pressure (est): 3 mm Hg. - Atrial septum: No defect or patent foramen ovale was identified. - Tricuspid valve: There was mild regurgitation. - Pulmonary arteries: PA peak pressure: 32 mm Hg (S). - Pericardium, extracardiac: There was no pericardial effusion.  Assessment and Plan:  1.  Valvular heart disease as outlined above, most recently status post TAVR at [facility] for treatment of severe aortic regurgitation due to torn coronary cusp of his previously placed bioprosthetic AVR.  At this time he is on aspirin and Plavix.  Recent echocardiogram shows normal aortic valve gradient with mild perivalvular regurgitation.  2.  Secondary cardiomyopathy with improvement in LVEF to the range of 40-45% by recent echocardiogram.  3.  CAD with history of  BMS of the RCA in 2014.  No active angina symptoms.  He is on aspirin and statin.  4.  History of noncompliance with medications and follow-up.  States his he is taking all of his medications now.  Current medicines were reviewed with the patient today.  Disposition: Follow-up in 6 months.  Signed, Satira Sark, MD, [facility] 01/10/2018 2:03 PM    [place] [facility] [address] [place], [place] [address] Phone: [phone]; Fax: [phone]

## 2018-01-19 ENCOUNTER — Ambulatory Visit: Payer: Self-pay | Admitting: Family Medicine

## 2018-01-19 ENCOUNTER — Ambulatory Visit: Payer: Self-pay | Admitting: Cardiology

## 2018-02-10 ENCOUNTER — Ambulatory Visit: Payer: Self-pay | Admitting: Family Medicine

## 2018-03-08 ENCOUNTER — Encounter: Payer: Self-pay | Admitting: Family Medicine

## 2018-03-08 ENCOUNTER — Ambulatory Visit (INDEPENDENT_AMBULATORY_CARE_PROVIDER_SITE_OTHER): Payer: Medicare Other | Admitting: Family Medicine

## 2018-03-08 VITALS — BP 148/80 | HR 48 | Resp 16 | Ht 64.0 in | Wt 122.0 lb

## 2018-03-08 DIAGNOSIS — E559 Vitamin D deficiency, unspecified: Secondary | ICD-10-CM

## 2018-03-08 DIAGNOSIS — E785 Hyperlipidemia, unspecified: Secondary | ICD-10-CM | POA: Diagnosis not present

## 2018-03-08 DIAGNOSIS — I10 Essential (primary) hypertension: Secondary | ICD-10-CM

## 2018-03-08 DIAGNOSIS — J3089 Other allergic rhinitis: Secondary | ICD-10-CM | POA: Diagnosis not present

## 2018-03-08 NOTE — Patient Instructions (Addendum)
Please cancel and reschedul MD September visit, change that to an mD physical exam in December 7 or after, please call if you  need me before  Need wellness with nurse , schedule end August or in September please, may get flu vaccine then   CBC, TSH, Vit D , chem 7 and EGFr today    Continue to keep well

## 2018-03-09 LAB — CBC
HEMATOCRIT: 41.2 % (ref 38.5–50.0)
Hemoglobin: 13.6 g/dL (ref 13.2–17.1)
MCH: 27.8 pg (ref 27.0–33.0)
MCHC: 33 g/dL (ref 32.0–36.0)
MCV: 84.1 fL (ref 80.0–100.0)
MPV: 9.5 fL (ref 7.5–12.5)
Platelets: 261 10*3/uL (ref 140–400)
RBC: 4.9 10*6/uL (ref 4.20–5.80)
RDW: 14.4 % (ref 11.0–15.0)
WBC: 8.1 10*3/uL (ref 3.8–10.8)

## 2018-03-09 LAB — BASIC METABOLIC PANEL WITH GFR
BUN: 21 mg/dL (ref 7–25)
CO2: 28 mmol/L (ref 20–32)
CREATININE: 1.11 mg/dL (ref 0.70–1.25)
Calcium: 9.5 mg/dL (ref 8.6–10.3)
Chloride: 101 mmol/L (ref 98–110)
GFR, EST AFRICAN AMERICAN: 80 mL/min/{1.73_m2} (ref >=60)
GFR, EST NON AFRICAN AMERICAN: 69 mL/min/{1.73_m2} (ref >=60)
Glucose, Bld: 85 mg/dL (ref 65–99)
Potassium: 4 mmol/L (ref 3.5–5.3)
SODIUM: 138 mmol/L (ref 135–146)

## 2018-03-09 LAB — TSH: TSH: 0.7 mIU/L (ref 0.40–4.50)

## 2018-03-09 LAB — VITAMIN D 25 HYDROXY (VIT D DEFICIENCY, FRACTURES): Vit D, 25-Hydroxy: 47 ng/mL (ref 30–100)

## 2018-03-11 ENCOUNTER — Encounter: Payer: Self-pay | Admitting: Family Medicine

## 2018-03-11 NOTE — Assessment & Plan Note (Signed)
Hyperlipidemia:Low fat diet discussed and encouraged.   Lipid Panel  Lab Results  Component Value Date   CHOL 159 08/26/2017   HDL 51 08/26/2017   LDLCALC 94 08/26/2017   TRIG 48 08/26/2017   CHOLHDL 3.1 08/26/2017   Updated lab needed at/ before next visit.

## 2018-03-11 NOTE — Assessment & Plan Note (Signed)
Controlled, no change in medication  

## 2018-03-11 NOTE — Assessment & Plan Note (Signed)
Not at goal, but reports did not take medication on day of visit DASH diet and commitment to daily physical activity for a minimum of 30 minutes discussed and encouraged, as a part of hypertension management. The importance of attaining a healthy weight is also discussed.  BP/Weight 03/08/2018 01/10/2018 11/24/2017 11/01/2017 08/26/2017 05/19/2017 0/38/8828  Systolic BP 003 491 791 505 697 948 016  Diastolic BP 80 80 82 70 74 60 60  Wt. (Lbs) 122 118 119 112 117 117.12 119.8  BMI 20.94 20.25 20.43 19.22 20.08 20.1 20.56

## 2018-03-11 NOTE — Progress Notes (Signed)
   [name]     MRN: [id number]      DOB: 08/18/1951   HPI [name] is here for follow up and re-evaluation of chronic medical conditions, medication management and review of any available recent lab and radiology data.  Preventive health is updated, specifically  Cancer screening and Immunization.    The PT denies any adverse reactions to current medications since the last visit.  There are no new concerns.  There are no specific complaints   ROS Denies recent fever or chills. Denies sinus pressure, nasal congestion, ear pain or sore throat. Denies chest congestion, productive cough or wheezing. Denies chest pains, palpitations and leg swelling Denies abdominal pain, nausea, vomiting,diarrhea or constipation.   Denies dysuria, frequency, hesitancy or incontinence. Denies uncontrolled  joint pain, swelling and limitation in mobility. Denies headaches, seizures, numbness, or tingling. Denies depression, anxiety or insomnia. Denies skin break down or rash.   PE  BP (!) 148/80   Pulse (!) 48   Resp 16   Ht 5\' 4"  (1.626 m)   Wt 122 lb (55.3 kg)   SpO2 100%   BMI 20.94 kg/m   Patient alert and oriented and in no cardiopulmonary distress.  HEENT: No facial asymmetry, EOMI,   oropharynx pink and moist.  Neck supple no JVD, no mass.  Chest: Clear to auscultation bilaterally.  CVS: S1, S2 systolic  murmur, no S3.Regular rate.  ABD: Soft non tender.   Ext: No edema  MS: Adequate ROM spine, shoulders, hips and knees.  Skin: Intact, no ulcerations or rash noted.  Psych: Good eye contact, normal affect. Memory intact not anxious or depressed appearing.  CNS: CN 2-12 intact, power,  normal throughout.no focal deficits noted.   Assessment & Plan  Essential hypertension, benign Not at goal, but reports did not take medication on day of visit DASH diet and commitment to daily physical activity for a minimum of 30 minutes discussed and encouraged, as a part of  hypertension management. The importance of attaining a healthy weight is also discussed.  BP/Weight 03/08/2018 01/10/2018 11/24/2017 11/01/2017 08/26/2017 05/19/2017 02/06/8415  Systolic BP 606 301 601 093 235 573 220  Diastolic BP 80 80 82 70 74 60 60  Wt. (Lbs) 122 118 119 112 117 117.12 119.8  BMI 20.94 20.25 20.43 19.22 20.08 20.1 20.56       Hyperlipidemia LDL goal <70 Hyperlipidemia:Low fat diet discussed and encouraged.   Lipid Panel  Lab Results  Component Value Date   CHOL 159 08/26/2017   HDL 51 08/26/2017   LDLCALC 94 08/26/2017   TRIG 48 08/26/2017   CHOLHDL 3.1 08/26/2017   Updated lab needed at/ before next visit.     Allergic rhinitis Controlled, no change in medication

## 2018-05-05 ENCOUNTER — Other Ambulatory Visit: Payer: Self-pay | Admitting: Family Medicine

## 2018-05-09 ENCOUNTER — Ambulatory Visit (INDEPENDENT_AMBULATORY_CARE_PROVIDER_SITE_OTHER): Payer: Medicare Other

## 2018-05-09 VITALS — BP 143/66 | HR 80 | Resp 16 | Ht 64.0 in | Wt 123.0 lb

## 2018-05-09 DIAGNOSIS — Z0000 Encounter for general adult medical examination without abnormal findings: Secondary | ICD-10-CM | POA: Diagnosis not present

## 2018-05-09 DIAGNOSIS — Z23 Encounter for immunization: Secondary | ICD-10-CM | POA: Diagnosis not present

## 2018-05-09 NOTE — Patient Instructions (Signed)
[name] , Thank you for taking time to come for your Medicare Wellness Visit. I appreciate your ongoing commitment to your health goals. Please review the following plan we discussed and let me know if I can assist you in the future.   Screening recommendations/referrals: Colonoscopy: up to date Recommended yearly ophthalmology/optometry visit for glaucoma screening and checkup Recommended yearly dental visit for hygiene and checkup  Vaccinations: Influenza vaccine: Given today  Pneumococcal vaccine: up to date  Tdap vaccine: up to date  Shingles vaccine: ask insurance if shingrix is covered   Advanced directives: given   Conditions/risks identified: done   Next appointment: done   Preventive Care 66 Years and Older, Male Preventive care refers to lifestyle choices and visits with your health care provider that can promote health and wellness. What does preventive care include?  A yearly physical exam. This is also called an annual well check.  Dental exams once or twice a year.  Routine eye exams. Ask your health care provider how often you should have your eyes checked.  Personal lifestyle choices, including:  Daily care of your teeth and gums.  Regular physical activity.  Eating a healthy diet.  Avoiding tobacco and drug use.  Limiting alcohol use.  Practicing safe sex.  Taking low doses of aspirin every day.  Taking vitamin and mineral supplements as recommended by your health care provider. What happens during an annual well check? The services and screenings done by your health care provider during your annual well check will depend on your age, overall health, lifestyle risk factors, and family history of disease. Counseling  Your health care provider may ask you questions about your:  Alcohol use.  Tobacco use.  Drug use.  Emotional well-being.  Home and relationship well-being.  Sexual activity.  Eating habits.  History of falls.  Memory  and ability to understand (cognition).  Work and work Statistician. Screening  You may have the following tests or measurements:  Height, weight, and BMI.  Blood pressure.  Lipid and cholesterol levels. These may be checked every 5 years, or more frequently if you are over 10 years old.  Skin check.  Lung cancer screening. You may have this screening every year starting at age 41 if you have a 30-pack-year history of smoking and currently smoke or have quit within the past 15 years.  Fecal occult blood test (FOBT) of the stool. You may have this test every year starting at age 18.  Flexible sigmoidoscopy or colonoscopy. You may have a sigmoidoscopy every 5 years or a colonoscopy every 10 years starting at age 55.  Prostate cancer screening. Recommendations will vary depending on your family history and other risks.  Hepatitis C blood test.  Hepatitis B blood test.  Sexually transmitted disease (STD) testing.  Diabetes screening. This is done by checking your blood sugar (glucose) after you have not eaten for a while (fasting). You may have this done every 1-3 years.  Abdominal aortic aneurysm (AAA) screening. You may need this if you are a current or former smoker.  Osteoporosis. You may be screened starting at age 46 if you are at high risk. Talk with your health care provider about your test results, treatment options, and if necessary, the need for more tests. Vaccines  Your health care provider may recommend certain vaccines, such as:  Influenza vaccine. This is recommended every year.  Tetanus, diphtheria, and acellular pertussis (Tdap, Td) vaccine. You may need a Td booster every 10 years.  Zoster vaccine. You may need this after age 81.  Pneumococcal 13-valent conjugate (PCV13) vaccine. One dose is recommended after age 87.  Pneumococcal polysaccharide (PPSV23) vaccine. One dose is recommended after age 21. Talk to your health care provider about which screenings  and vaccines you need and how often you need them. This information is not intended to replace advice given to you by your health care provider. Make sure you discuss any questions you have with your health care provider. Document Released: 10/04/2015 Document Revised: 05/27/2016 Document Reviewed: 07/09/2015 Elsevier Interactive Patient Education  2017 [place] Prevention in the Home Falls can cause injuries. They can happen to people of all ages. There are many things you can do to make your home safe and to help prevent falls. What can I do on the outside of my home?  Regularly fix the edges of walkways and driveways and fix any cracks.  Remove anything that might make you trip as you walk through a door, such as a raised step or threshold.  Trim any bushes or trees on the path to your home.  Use bright outdoor lighting.  Clear any walking paths of anything that might make someone trip, such as rocks or tools.  Regularly check to see if handrails are loose or broken. Make sure that both sides of any steps have handrails.  Any raised decks and porches should have guardrails on the edges.  Have any leaves, snow, or ice cleared regularly.  Use sand or salt on walking paths during winter.  Clean up any spills in your garage right away. This includes oil or grease spills. What can I do in the bathroom?  Use night lights.  Install grab bars by the toilet and in the tub and shower. Do not use towel bars as grab bars.  Use non-skid mats or decals in the tub or shower.  If you need to sit down in the shower, use a plastic, non-slip stool.  Keep the floor dry. Clean up any water that spills on the floor as soon as it happens.  Remove soap buildup in the tub or shower regularly.  Attach bath mats securely with double-sided non-slip rug tape.  Do not have throw rugs and other things on the floor that can make you trip. What can I do in the bedroom?  Use night  lights.  Make sure that you have a light by your bed that is easy to reach.  Do not use any sheets or blankets that are too big for your bed. They should not hang down onto the floor.  Have a firm chair that has side arms. You can use this for support while you get dressed.  Do not have throw rugs and other things on the floor that can make you trip. What can I do in the kitchen?  Clean up any spills right away.  Avoid walking on wet floors.  Keep items that you use a lot in easy-to-reach places.  If you need to reach something above you, use a strong step stool that has a grab bar.  Keep electrical cords out of the way.  Do not use floor polish or wax that makes floors slippery. If you must use wax, use non-skid floor wax.  Do not have throw rugs and other things on the floor that can make you trip. What can I do with my stairs?  Do not leave any items on the stairs.  Make sure that there are  handrails on both sides of the stairs and use them. Fix handrails that are broken or loose. Make sure that handrails are as long as the stairways.  Check any carpeting to make sure that it is firmly attached to the stairs. Fix any carpet that is loose or worn.  Avoid having throw rugs at the top or bottom of the stairs. If you do have throw rugs, attach them to the floor with carpet tape.  Make sure that you have a light switch at the top of the stairs and the bottom of the stairs. If you do not have them, ask someone to add them for you. What else can I do to help prevent falls?  Wear shoes that:  Do not have high heels.  Have rubber bottoms.  Are comfortable and fit you well.  Are closed at the toe. Do not wear sandals.  If you use a stepladder:  Make sure that it is fully opened. Do not climb a closed stepladder.  Make sure that both sides of the stepladder are locked into place.  Ask someone to hold it for you, if possible.  Clearly mark and make sure that you can  see:  Any grab bars or handrails.  First and last steps.  Where the edge of each step is.  Use tools that help you move around (mobility aids) if they are needed. These include:  Canes.  Walkers.  Scooters.  Crutches.  Turn on the lights when you go into a dark area. Replace any light bulbs as soon as they burn out.  Set up your furniture so you have a clear path. Avoid moving your furniture around.  If any of your floors are uneven, fix them.  If there are any pets around you, be aware of where they are.  Review your medicines with your doctor. Some medicines can make you feel dizzy. This can increase your chance of falling. Ask your doctor what other things that you can do to help prevent falls. This information is not intended to replace advice given to you by your health care provider. Make sure you discuss any questions you have with your health care provider. Document Released: 07/04/2009 Document Revised: 02/13/2016 Document Reviewed: 10/12/2014 Elsevier Interactive Patient Education  2017 [employer].

## 2018-05-09 NOTE — Progress Notes (Signed)
Subjective:   [name] is a 67 y.o. male who presents for Medicare Annual/Subsequent preventive examination.  Review of Systems:   Cardiac Risk Factors include: advanced age (>73men, >86 women);hypertension;male gender;smoking/ tobacco exposure;dyslipidemia     Objective:    Vitals: BP (!) 143/66   Pulse 80   Resp 16   Ht 5\' 4"  (1.626 m)   Wt 123 lb (55.8 kg)   SpO2 93%   BMI 21.11 kg/m   Body mass index is 21.11 kg/m.  Advanced Directives 05/09/2018 05/19/2017 05/12/2016 04/20/2014 08/09/2013  Does Patient Have a Medical Advance Directive? Yes No No Patient does not have advance directive;Patient would not like information Patient has advance directive, copy not in chart  Does patient want to make changes to medical advance directive? Yes (ED - Information included in AVS) - - - No  Copy of Healthcare Power of Attorney in Chart? - - - - Copy requested from other (Comment)  Would patient like information on creating a medical advance directive? - Yes (MAU/Ambulatory/Procedural Areas - Information given) Yes - Educational materials given - -  Pre-existing out of facility DNR order (yellow form or pink MOST form) - - - No No    Tobacco Social History   Tobacco Use  Smoking Status Former Smoker  . Packs/day: 0.50  . Years: 3.00  . Pack years: 1.50  . Types: Cigarettes  . Start date: 10/12/1965  . Last attempt to quit: 02/19/1970  . Years since quitting: 48.2  Smokeless Tobacco Never Used     Counseling given: Not Answered   Clinical Intake:  Pre-visit preparation completed: Yes  Pain : 0-10 Pain Score: 7  Pain Type: Chronic pain Pain Location: Back Pain Orientation: Lower Pain Radiating Towards: into both legs  Pain Descriptors / Indicators: Burning, Aching Pain Onset: More than a month ago Pain Frequency: Intermittent     Nutritional Status: BMI of 19-24  Normal Diabetes: No  How often do you need to have someone help you when you read instructions,  pamphlets, or other written materials from your doctor or pharmacy?: 3 - Sometimes What is the last grade level you completed in school?: 11th grade   Interpreter Needed?: No  Information entered by :: Eliyah Bazzi LPN   Past Medical History:  Diagnosis Date  . Arthritis   . CAD (coronary artery disease)    a. s/p PCI with 3.5 x 18 BMS to mid RCA, non obst LAD, Lcx patent 08/10/13  . Depression   . Embolus of femoral artery [facility]) 2012   Surgical repair, previously on Coumadin  . Essential hypertension, benign   . ETOH abuse   . Headache(784.0)   . History of DVT (deep vein thrombosis) 2011  . History of GI bleed    February 2012   . 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 Prosthetic valve endocarditis ([place])   . S/P aortic valve replacement    Details not clear -  reportedly 1971 per patient   Past Surgical History:  Procedure Laterality Date  . Aoric valve replacement  January 2005   70mm Medtronic Freestyle stentless porcine bioprosthesis [facility])  . CARDIAC CATHETERIZATION     30-40% mid LAD, 20-30% ostial D1, proximal 30-40% RCA, 95% mid RCA thrombus, 40% distal stenosis. EF 45%, inferior HK.  [name] COLONOSCOPY N/A 04/20/2014   Procedure: COLONOSCOPY;  Surgeon: Rogene Houston, MD;  [facility];  Service: Endoscopy;  Laterality: N/A;  930  . CORONARY ANGIOPLASTY WITH STENT PLACEMENT  07/2013  . LEFT HEART CATHETERIZATION WITH CORONARY ANGIOGRAM N/A 08/10/2013   Procedure: LEFT HEART CATHETERIZATION WITH CORONARY ANGIOGRAM;  Surgeon: Blane Ohara, MD;  Location: [facility] CATH LAB;  Service: Cardiovascular;  Laterality: N/A;  . Left leg embolectomy  May 2012   Dr. Trula Slade - reason for lifelong coumadin   Family History  Problem Relation Age of Onset  . Diabetes Mother   . Cirrhosis Father   . COPD Sister    Social History   Socioeconomic History  .  Marital status: Single    Spouse name: Not on file  . Number of children: 2  . Years of education: Not on file  . Highest education level: 11th grade  Occupational History  . Not on file  Social Needs  . Financial resource strain: Somewhat hard  . Food insecurity:    Worry: Sometimes true    Inability: Sometimes true  . Transportation needs:    Medical: Yes    Non-medical: Yes  Tobacco Use  . Smoking status: Former Smoker    Packs/day: 0.50    Years: 3.00    Pack years: 1.50    Types: Cigarettes    Start date: 10/12/1965    Last attempt to quit: 02/19/1970    Years since quitting: 48.2  . Smokeless tobacco: Never Used  Substance and Sexual Activity  . Alcohol use: No    Alcohol/week: 10.0 standard drinks    Types: 10 Cans of beer per week  . Drug use: No  . Sexual activity: Not Currently  Lifestyle  . Physical activity:    Days per week: 3 days    Minutes per session: 20 min  . Stress: Only a little  Relationships  . Social connections:    Talks on phone: Once a week    Gets together: Three times a week    Attends religious service: More than 4 times per year    Active member of club or organization: No    Attends meetings of clubs or organizations: Never    Relationship status: Divorced  Other Topics Concern  . Not on file  Social History Narrative  . Not on file    Outpatient Encounter Medications as of 05/09/2018  Medication Sig  . aspirin EC 81 MG tablet Take 1 tablet (81 mg total) by mouth daily.  [name] atorvastatin (LIPITOR) 40 MG tablet Take 1 tablet (40 mg total) by mouth daily.  . cholecalciferol (VITAMIN D) 1000 units tablet Take 1 tablet (1,000 Units total) by mouth daily.  . clopidogrel (PLAVIX) 75 MG tablet TAKE 1 TABLET BY MOUTH ONCE DAILY  . furosemide (LASIX) 40 MG tablet Take 1 tablet (40 mg total) by mouth daily.  [name] lisinopril (PRINIVIL,ZESTRIL) 20 MG tablet Take 1 tablet (20 mg total) by mouth daily.  . metoprolol succinate (TOPROL-XL) 50 MG 24 hr  tablet Take 1 tablet (50 mg total) by mouth daily. Take with or immediately following a meal.  . montelukast (SINGULAIR) 10 MG tablet Take 1 tablet (10 mg total) by mouth at bedtime.  . Naproxen Sodium (ALEVE) 220 MG CAPS Take 1 capsule by mouth 2 (two) times daily.  . nitroGLYCERIN (NITROSTAT) 0.4 MG SL tablet DISSOLVE ONE TABLET UNDER THE TONGUE EVERY 5 MINUTES AS NEEDED FOR CHEST PAIN.  DO NOT EXCEED A TOTAL OF 3 DOSES IN 15  MINUTES   No facility-administered encounter medications on file as of 05/09/2018.     Activities of Daily Living In your present state of health, do you have any difficulty performing the following activities: 05/09/2018 05/19/2017  Hearing? N N  Vision? Y Y  Comment - due to loss of vision in his left eye  Difficulty concentrating or making decisions? N N  Walking or climbing stairs? Y N  Dressing or bathing? N N  Doing errands, shopping? Y Y  Comment - no longer drives  Preparing Food and eating ? N N  Using the Toilet? N N  In the past six months, have you accidently leaked urine? N N  Do you have problems with loss of bowel control? N N  Managing your Medications? N N  Managing your Finances? N N  Housekeeping or managing your Housekeeping? N N  Some recent data might be hidden    Patient Care Team: [name], MD as PCP - General Domenic Polite Aloha Gell, MD as PCP - Cardiology (Cardiology) [name], Lavell Islam, MD (Infectious Diseases)   Assessment:   This is a routine wellness examination for [place].  Exercise Activities and Dietary recommendations Current Exercise Habits: The patient has a physically strenous job, but has no regular exercise apart from work., Exercise limited by: cardiac condition(s);orthopedic condition(s)  Goals    . maintain current exercise routine       Fall Risk Fall Risk  05/09/2018 05/19/2017 12/30/2016 03/27/2015  Falls in the past year? Yes No No No  Number falls in past yr: 1 - - -  Injury with Fall? No - - -    Risk for fall due to : - Impaired balance/gait - Impaired balance/gait   Is the patient's home free of loose throw rugs in walkways, pet beds, electrical cords, etc?   yes      Grab bars in the bathroom? yes      Handrails on the stairs?   yes      Adequate lighting?   yes  Timed Get Up and Go Performed:    Depression Screen PHQ 2/9 Scores 05/09/2018 03/08/2018 05/19/2017 05/12/2016  PHQ - 2 Score 0 3 0 6  PHQ- 9 Score 2 6 0 13    Cognitive Function     6CIT Screen 05/09/2018 05/19/2017  What Year? 0 points 0 points  What month? 0 points 0 points  What time? 0 points 0 points  Count back from 20 0 points 0 points  Months in reverse 0 points 0 points  Repeat phrase 2 points 0 points  Total Score 2 0    Immunization History  Administered Date(s) Administered  . Influenza Split 06/11/2011, 06/07/2012  . Influenza,inj,Quad PF,6+ Mos 10/18/2013, 07/04/2014, 07/30/2015, 05/12/2016, 05/19/2017  . Pneumococcal Conjugate-13 12/12/2014  . Pneumococcal Polysaccharide-23 12/30/2016  . Tdap 10/18/2013    Qualifies for Shingles Vaccine? Ask insurance if covered   Screening Tests Health Maintenance  Topic Date Due  . INFLUENZA VACCINE  04/21/2018  . TETANUS/TDAP  10/19/2023  . COLONOSCOPY  04/20/2024  . Hepatitis C Screening  Completed  . PNA vac Low Risk Adult  Completed   Cancer Screenings: Lung: Low Dose CT Chest recommended if Age 90-80 years, 30 pack-year currently smoking OR have quit w/in 15years. Patient does not qualify. Colorectal:   Additional Screenings:  Hepatitis C Screening:      Plan:     I have personally reviewed and noted the following in the patient's chart:   .  Medical and social history . Use of alcohol, tobacco or illicit drugs  . Current medications and supplements . Functional ability and status . Nutritional status . Physical activity . Advanced directives . List of other physicians . Hospitalizations, surgeries, and ER visits in previous  12 months . Vitals . Screenings to include cognitive, depression, and falls . Referrals and appointments  In addition, I have reviewed and discussed with patient certain preventive protocols, quality metrics, and best practice recommendations. A written personalized care plan for preventive services as well as general preventive health recommendations were provided to patient.     Kate Sable, LPN, LPN  3/38/3291

## 2018-06-09 ENCOUNTER — Ambulatory Visit: Payer: Self-pay | Admitting: Family Medicine

## 2018-07-19 NOTE — Progress Notes (Signed)
Cardiology Office Note  Date: 07/20/2018   ID: Bruce, Zhang 1951-07-04, MRN [id number]  PCP: [name], MD  Primary Cardiologist: Rozann Lesches, MD   Chief Complaint  Patient presents with  . Valvular heart disease    History of Present Illness: [name] is a 67 y.o. male last seen in April.  He is here for a routine follow-up.  He tells me that he has had no major interval change in health and reports compliance with his medications.  No interval hospitalizations.  Patient was hospitalized in January of this year at [facility] acute decompensated heart failure and LVEF 88-89% range(new)complicated by severe aortic insufficiency due to torn coronary cusp of his bioprosthetic AVR. He underwent TAVRduring that hospital stay with a 26 mm Sapien valve(complicated procedure per notes).He was placed on aspirin and Plavix.  Echocardiogram in April of this year revealed LVEF 40 to 45% with grade 1 diastolic dysfunction and TAVR prosthesis in aortic position with mild perivalvular regurgitation and mean gradient 7 mmHg.  Past Medical History:  Diagnosis Date  . Arthritis   . CAD (coronary artery disease)    a. s/p PCI with 3.5 x 18 BMS to mid RCA, non obst LAD, Lcx patent 08/10/13  . Depression   . Embolus of femoral artery [facility]) 2012   Surgical repair, previously on Coumadin  . Essential hypertension, benign   . ETOH abuse   . Headache(784.0)   . History of DVT (deep vein thrombosis) 2011  . History of GI bleed    February 2012   . 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 Prosthetic valve endocarditis ([place])   . S/P aortic valve replacement    Details not clear -  reportedly 1971 per patient    Past Surgical History:  Procedure Laterality Date  . Aoric valve replacement  January 2005   33mm Medtronic Freestyle stentless  porcine bioprosthesis [facility])  . CARDIAC CATHETERIZATION     30-40% mid LAD, 20-30% ostial D1, proximal 30-40% RCA, 95% mid RCA thrombus, 40% distal stenosis. EF 45%, inferior HK.  [name] COLONOSCOPY N/A 04/20/2014   Procedure: COLONOSCOPY;  Surgeon: Rogene Houston, MD;  [facility];  Service: Endoscopy;  Laterality: N/A;  930  . CORONARY ANGIOPLASTY WITH STENT PLACEMENT  07/2013  . LEFT HEART CATHETERIZATION WITH CORONARY ANGIOGRAM N/A 08/10/2013   Procedure: LEFT HEART CATHETERIZATION WITH CORONARY ANGIOGRAM;  Surgeon: Blane Ohara, MD;  Location: [facility] CATH LAB;  Service: Cardiovascular;  Laterality: N/A;  . Left leg embolectomy  May 2012   Dr. Trula Slade - reason for lifelong coumadin    Current Outpatient Medications  Medication Sig Dispense Refill  . aspirin EC 81 MG tablet Take 1 tablet (81 mg total) by mouth daily. 90 tablet 3  . atorvastatin (LIPITOR) 40 MG tablet Take 1 tablet (40 mg total) by mouth daily. 90 tablet 3  . cholecalciferol (VITAMIN D) 1000 units tablet Take 1 tablet (1,000 Units total) by mouth daily. 90 tablet 3  . clopidogrel (PLAVIX) 75 MG tablet TAKE 1 TABLET BY MOUTH ONCE DAILY 90 tablet 1  . furosemide (LASIX) 40 MG tablet Take 1 tablet (40 mg total) by mouth daily. 90 tablet 3  . lisinopril (PRINIVIL,ZESTRIL) 20 MG tablet Take 1 tablet (20 mg total) by mouth daily. 90 tablet 3  . metoprolol  succinate (TOPROL-XL) 50 MG 24 hr tablet Take 1 tablet (50 mg total) by mouth daily. Take with or immediately following a meal. 90 tablet 3  . montelukast (SINGULAIR) 10 MG tablet Take 1 tablet (10 mg total) by mouth at bedtime. 90 tablet 3  . Naproxen Sodium (ALEVE) 220 MG CAPS Take 1 capsule by mouth 2 (two) times daily.    . nitroGLYCERIN (NITROSTAT) 0.4 MG SL tablet DISSOLVE ONE TABLET UNDER THE TONGUE EVERY 5 MINUTES AS NEEDED FOR CHEST PAIN.  DO NOT EXCEED A TOTAL OF 3 DOSES IN 15 MINUTES 25 tablet 3   No current facility-administered medications for this visit.      Allergies:  Patient has no known allergies.   Social History: The patient  reports that he quit smoking about 48 years ago. His smoking use included cigarettes. He started smoking about 52 years ago. He has a 1.50 pack-year smoking history. He has never used smokeless tobacco. He reports that he does not drink alcohol or use drugs.   ROS:  Please see the history of present illness. Otherwise, complete review of systems is positive for chronic knee and hip pain.  All other systems are reviewed and negative.   Physical Exam: VS:  BP 138/82 (BP Location: Right Arm)   Pulse 69   Ht 5\' 4"  (1.626 m)   Wt 127 lb (57.6 kg)   SpO2 98%   BMI 21.80 kg/m , BMI Body mass index is 21.8 kg/m.  Wt Readings from Last 3 Encounters:  07/20/18 127 lb (57.6 kg)  05/09/18 123 lb (55.8 kg)  03/08/18 122 lb (55.3 kg)    General: Chronically ill-appearing male, no distress. HEENT: Conjunctiva and lids normal, oropharynx clear. Neck: Supple, no elevated JVP or carotid bruits, no thyromegaly. Lungs: Clear to auscultation, nonlabored breathing at rest. Cardiac: Regular rate and rhythm, no S3, 2/6 systolic murmur and 2/6 diastolic murmur, no pericardial rub. Abdomen: Soft, nontender, bowel sounds present. Extremities: No pitting edema, distal pulses 2+. Skin: Warm and dry. Musculoskeletal: No kyphosis. Neuropsychiatric: Alert and oriented x3, affect grossly appropriate.  ECG: I personally reviewed the tracing from 11/24/2017 which showed sinus bradycardia with LVH and prolonged PR interval.  Recent Labwork: 08/26/2017: ALT 9; AST 16 03/08/2018: BUN 21; Creat 1.11; Hemoglobin 13.6; Platelets 261; Potassium 4.0; Sodium 138; TSH 0.70     Component Value Date/Time   CHOL 159 08/26/2017 1126   TRIG 48 08/26/2017 1126   HDL 51 08/26/2017 1126   CHOLHDL 3.1 08/26/2017 1126   VLDL 12 12/30/2016 0853   LDLCALC 94 08/26/2017 1126    Other Studies Reviewed Today:  Echocardiogram 12/27/2017: Study  Conclusions  - Left ventricle: The cavity size was normal. Wall thickness was   increased in a pattern of moderate LVH. Systolic function was   mildly to moderately reduced. The estimated ejection fraction was   in the range of 40% to 45%. Diffuse hypokinesis with some   regional variation. Doppler parameters are consistent with   abnormal left ventricular relaxation (grade 1 diastolic   dysfunction). - Aortic valve: 26 mm Sapien valve positioned within previously   placed aortic valve bioprosthesis. There was mild perivalvular   regurgitation. Mean gradient (S): 7 mm Hg. Peak gradient (S): 15   mm Hg. - Mitral valve: Mildly thickened leaflets . There was mild   regurgitation. - Left atrium: The atrium was mildly to moderately dilated. - Right ventricle: Systolic function was mildly reduced. - Right atrium: The atrium was mildly  dilated. Central venous   pressure (est): 3 mm Hg. - Atrial septum: No defect or patent foramen ovale was identified. - Tricuspid valve: There was mild regurgitation. - Pulmonary arteries: PA peak pressure: 32 mm Hg (S). - Pericardium, extracardiac: There was no pericardial effusion.  Assessment and Plan:  1.  Aortic valve disease status post TAVR at [facility] for treatment of severe aortic regurgitation due to torn coronary cusp of his previously placed bioprosthetic AVR.  Follow-up echocardiogram in April show a mild perivalvular leak otherwise normal gradient.  Continue aspirin and Plavix.  Follow-up echocardiogram prior to his next visit.  2.  Secondary cardiomyopathy with LVEF up to 40 to 45% range by last echocardiogram.  Continue medical therapy including Toprol-XL and lisinopril.  3.  CAD status post BMS to the RCA in 2014.  He reports no obvious angina symptoms and remains on antiplatelet regimen with statin.  No recent nitroglycerin use.  Current medicines were reviewed with the patient today.   Orders Placed This Encounter  Procedures  .  ECHOCARDIOGRAM COMPLETE    Disposition: Follow up in 6 months.  Signed, Satira Sark, MD, [facility] 07/20/2018 12:07 PM    [place] [facility] [address], [place], [place] [address] Phone: [phone]; Fax: [phone]

## 2018-07-20 ENCOUNTER — Encounter: Payer: Self-pay | Admitting: Cardiology

## 2018-07-20 ENCOUNTER — Ambulatory Visit (INDEPENDENT_AMBULATORY_CARE_PROVIDER_SITE_OTHER): Payer: Medicare Other | Admitting: Cardiology

## 2018-07-20 VITALS — BP 138/82 | HR 69 | Ht 64.0 in | Wt 127.0 lb

## 2018-07-20 DIAGNOSIS — I429 Cardiomyopathy, unspecified: Secondary | ICD-10-CM

## 2018-07-20 DIAGNOSIS — I350 Nonrheumatic aortic (valve) stenosis: Secondary | ICD-10-CM

## 2018-07-20 DIAGNOSIS — Z952 Presence of prosthetic heart valve: Secondary | ICD-10-CM

## 2018-07-20 DIAGNOSIS — I25119 Atherosclerotic heart disease of native coronary artery with unspecified angina pectoris: Secondary | ICD-10-CM | POA: Diagnosis not present

## 2018-07-20 NOTE — Patient Instructions (Signed)
Medication Instructions:  Your physician recommends that you continue on your current medications as directed. Please refer to the Current Medication list given to you today.  If you need a refill on your cardiac medications before your next appointment, please call your pharmacy.   Lab work: None If you have labs (blood work) drawn today and your tests are completely normal, you will receive your results only by: [name] MyChart Message (if you have MyChart) OR . A paper copy in the mail If you have any lab test that is abnormal or we need to change your treatment, we will call you to review the results.  Testing/Procedures: Your physician has requested that you have an echocardiogram (IN 6 MONTHS). Echocardiography is a painless test that uses sound waves to create images of your heart. It provides your doctor with information about the size and shape of your heart and how well your heart's chambers and valves are working. This procedure takes approximately one hour. There are no restrictions for this procedure.    Follow-Up: At [facility], you and your health needs are our priority.  As part of our continuing mission to provide you with exceptional heart care, we have created designated Provider Care Teams.  These Care Teams include your primary Cardiologist (physician) and Advanced Practice Providers (APPs -  Physician Assistants and Nurse Practitioners) who all work together to provide you with the care you need, when you need it. You will need a follow up appointment in 6 months.  Please call our office 2 months in advance to schedule this appointment.  You may see Rozann Lesches, MDor one of the following Advanced Practice Providers on your designated Care Team:   Bernerd Pho, PA-C [facility]) . Ermalinda Barrios, PA-C ([place])  Any Other Special Instructions Will Be Listed Below (If Applicable). NONE

## 2018-09-01 ENCOUNTER — Ambulatory Visit (INDEPENDENT_AMBULATORY_CARE_PROVIDER_SITE_OTHER): Payer: Medicare Other | Admitting: Family Medicine

## 2018-09-01 ENCOUNTER — Encounter: Payer: Self-pay | Admitting: Family Medicine

## 2018-09-01 ENCOUNTER — Other Ambulatory Visit: Payer: Self-pay | Admitting: Family Medicine

## 2018-09-01 VITALS — BP 136/58 | HR 63 | Resp 12 | Ht 64.0 in | Wt 121.0 lb

## 2018-09-01 DIAGNOSIS — J301 Allergic rhinitis due to pollen: Secondary | ICD-10-CM

## 2018-09-01 DIAGNOSIS — J3089 Other allergic rhinitis: Secondary | ICD-10-CM

## 2018-09-01 DIAGNOSIS — L600 Ingrowing nail: Secondary | ICD-10-CM

## 2018-09-01 DIAGNOSIS — J209 Acute bronchitis, unspecified: Secondary | ICD-10-CM | POA: Diagnosis not present

## 2018-09-01 DIAGNOSIS — N528 Other male erectile dysfunction: Secondary | ICD-10-CM

## 2018-09-01 DIAGNOSIS — I10 Essential (primary) hypertension: Secondary | ICD-10-CM | POA: Diagnosis not present

## 2018-09-01 DIAGNOSIS — Z0000 Encounter for general adult medical examination without abnormal findings: Secondary | ICD-10-CM

## 2018-09-01 DIAGNOSIS — E785 Hyperlipidemia, unspecified: Secondary | ICD-10-CM | POA: Diagnosis not present

## 2018-09-01 DIAGNOSIS — M79675 Pain in left toe(s): Secondary | ICD-10-CM

## 2018-09-01 LAB — COMPLETE METABOLIC PANEL WITH GFR
AG RATIO: 1.5 (calc) (ref 1.0–2.5)
ALT: 18 U/L (ref 9–46)
AST: 27 U/L (ref 10–35)
Albumin: 4.6 g/dL (ref 3.6–5.1)
Alkaline phosphatase (APISO): 73 U/L (ref 40–115)
BUN: 18 mg/dL (ref 7–25)
CO2: 29 mmol/L (ref 20–32)
CREATININE: 1.14 mg/dL (ref 0.70–1.25)
Calcium: 9.6 mg/dL (ref 8.6–10.3)
Chloride: 100 mmol/L (ref 98–110)
GFR, EST AFRICAN AMERICAN: 77 mL/min/{1.73_m2} (ref >=60)
GFR, Est Non African American: 67 mL/min/{1.73_m2} (ref >=60)
GLOBULIN: 3.1 g/dL (ref 1.9–3.7)
GLUCOSE: 91 mg/dL (ref 65–99)
Potassium: 4 mmol/L (ref 3.5–5.3)
SODIUM: 137 mmol/L (ref 135–146)
Total Bilirubin: 1.2 mg/dL (ref 0.2–1.2)
Total Protein: 7.7 g/dL (ref 6.1–8.1)

## 2018-09-01 LAB — LIPID PANEL
CHOL/HDL RATIO: 3.8 (calc) (ref <5.0)
CHOLESTEROL: 115 mg/dL (ref <200)
HDL: 30 mg/dL — AB (ref >40)
LDL Cholesterol (Calc): 70 mg/dL (calc)
NON-HDL CHOLESTEROL (CALC): 85 mg/dL (ref <130)
Triglycerides: 66 mg/dL (ref <150)

## 2018-09-01 LAB — PSA: PSA: 3.8 ng/mL (ref <=4.0)

## 2018-09-01 MED ORDER — BENZONATATE 100 MG PO CAPS: 100.0000 mg | ORAL_CAPSULE | Freq: Two times a day (BID) | ORAL | 0 refills | Status: DC | PRN

## 2018-09-01 MED ORDER — AZITHROMYCIN 250 MG PO TABS
ORAL_TABLET | ORAL | 0 refills | Status: DC
Start: 2018-09-01 — End: 2018-10-26

## 2018-09-01 MED ORDER — AZELASTINE HCL 0.1 % NA SOLN: 2.0000 | Freq: Two times a day (BID) | NASAL | 3 refills | Status: DC

## 2018-09-01 NOTE — Patient Instructions (Signed)
F/U in 4.5 months, call if you need me before  Medications are sent in for bronchitis, antibiotic, decongestant   Astellin spray sent for allergies, continue tablet  NO pain medication except tylenol  You are referred too Podiatry soonest available for management of left great toe  PSA, lipid, cmp and EGFr today

## 2018-09-01 NOTE — Progress Notes (Signed)
   [name]     MRN: [id number]      DOB: [birth year]   HPI: Patient is in for annual physical exam. Left great toe pain x 2 weeks, noted following trauma to toe. Dry cough with increased allergy symptoms x 1 week Recent labs, if available are reviewed. Immunization is reviewed , and  updated if needed.    PE; BP (!) 136/58 (BP Location: Left Arm, Patient Position: Sitting, Cuff Size: Normal)   Pulse 63   Resp 12   Ht 5\' 4"  (1.626 m)   Wt 121 lb 0.6 oz (54.9 kg)   SpO2 99% Comment: room air  BMI 20.78 kg/m   Pleasant male, alert and oriented x 3, in no cardio-pulmonary distress. Afebrile. HEENT No facial trauma or asymetry. Sinuses non tender. EOMI, pupils equally reactive to light.Nasal mucosa erythematous and edematous External ears normal, tympanic membranes clear. Oropharynx moist, no exudate. Neck: supple, no adenopathy,JVD or thyromegaly.No bruits.  Chest: .Decreased though adequate air entry, few bibasilar crackles, no wheezes Non tender to palpation  Cardiovascular system; Heart sounds normal,  S1 and  S2 ,no S3.  Systolic  murmur, no thrill. Apical beat not displaced Peripheral pulses normal.  Abdomen: Soft, non tender, no organomegaly or masses. No bruits. Bowel sounds normal. No guarding, tenderness or rebound.    Musculoskeletal exam: Full ROM of spine, hips , shoulders and knees. No deformity ,swelling or crepitus noted. No muscle wasting or atrophy.   Neurologic: Cranial nerves 2 to 12 intact. Power, tone ,sensation and reflexes normal throughout. No disturbance in gait. No tremor.  Skin: Intact, no ulceration, erythema , scaling or rash noted. Pigmentation normal throughout Ingrown left great toenail, not infected  Psych; Normal mood and affect. Judgement and concentration normal   Assessment & Plan:  Annual physical exam Annual exam as documented. Counseling done  re healthy lifestyle involving commitment to 150 minutes  exercise per week, heart healthy diet, and attaining healthy weight.The importance of adequate sleep also discussed. Regular seat belt use and home safety, is also discussed. Changes in health habits are decided on by the patient with goals and time frames  set for achieving them. Immunization and cancer screening needs are specifically addressed at this visit.   Allergic rhinitis Uncontrolled , Astelin added  Acute bronchitis Decongestant and antibiotic are prescribed  Ingrown left greater toenail Painful following recent trauma, despite lack of infection, refer to Podiatry

## 2018-09-04 ENCOUNTER — Encounter: Payer: Self-pay | Admitting: Family Medicine

## 2018-09-04 DIAGNOSIS — L600 Ingrowing nail: Secondary | ICD-10-CM

## 2018-09-04 HISTORY — DX: Ingrowing nail: L60.0

## 2018-09-04 NOTE — Assessment & Plan Note (Signed)
Uncontrolled, Astelin added 

## 2018-09-04 NOTE — Assessment & Plan Note (Signed)

## 2018-09-04 NOTE — Assessment & Plan Note (Signed)
Painful following recent trauma, despite lack of infection, refer to Podiatry

## 2018-09-04 NOTE — Assessment & Plan Note (Signed)
Decongestant and antibiotic are prescribed

## 2018-09-11 ENCOUNTER — Other Ambulatory Visit: Payer: Self-pay | Admitting: Family Medicine

## 2018-09-12 ENCOUNTER — Encounter: Payer: Self-pay | Admitting: *Deleted

## 2018-09-12 ENCOUNTER — Ambulatory Visit: Payer: Medicare Other | Admitting: Podiatry

## 2018-09-20 ENCOUNTER — Ambulatory Visit: Payer: Medicare Other | Admitting: Podiatry

## 2018-09-20 ENCOUNTER — Other Ambulatory Visit: Payer: Self-pay | Admitting: Podiatry

## 2018-09-20 ENCOUNTER — Ambulatory Visit (INDEPENDENT_AMBULATORY_CARE_PROVIDER_SITE_OTHER): Payer: Medicare Other

## 2018-09-20 VITALS — BP 155/57 | HR 58

## 2018-09-20 DIAGNOSIS — L600 Ingrowing nail: Secondary | ICD-10-CM | POA: Diagnosis not present

## 2018-09-20 DIAGNOSIS — M21619 Bunion of unspecified foot: Secondary | ICD-10-CM

## 2018-09-20 DIAGNOSIS — M21612 Bunion of left foot: Secondary | ICD-10-CM

## 2018-09-20 DIAGNOSIS — M79672 Pain in left foot: Secondary | ICD-10-CM

## 2018-09-20 NOTE — Patient Instructions (Signed)

## 2018-09-23 ENCOUNTER — Telehealth: Payer: Self-pay | Admitting: *Deleted

## 2018-09-23 DIAGNOSIS — M21619 Bunion of unspecified foot: Secondary | ICD-10-CM

## 2018-09-23 DIAGNOSIS — R0989 Other specified symptoms and signs involving the circulatory and respiratory systems: Secondary | ICD-10-CM

## 2018-09-23 NOTE — Progress Notes (Signed)
Subjective:   Patient ID: [name], male   DOB: 68 y.o.   MRN: [id number]   HPI 68 year old male presents the office today for concerns of ingrown toenail or pain to the left big toe he states it radiates L times well.  He points on the big toenail he points on the bunion site will use the majority of discomfort.  He denies any redness or drainage, the toenail site.  This is been an issue for the last 2 months.  Denies any history of blood clots in the left side is also had veins removed.  He states the nail itself is tender to touch.  He has no other concerns.   Review of Systems  All other systems reviewed and are negative.  Past Medical History:  Diagnosis Date  . Arthritis   . CAD (coronary artery disease)    a. s/p PCI with 3.5 x 18 BMS to mid RCA, non obst LAD, Lcx patent 08/10/13  . Depression   . Embolus of femoral artery [facility]) 2012   Surgical repair, previously on Coumadin  . Essential hypertension, benign   . ETOH abuse   . Headache(784.0)   . History of DVT (deep vein thrombosis) 2011  . History of GI bleed    February 2012   . 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 Prosthetic valve endocarditis ([place])   . S/P aortic valve replacement    Details not clear -  reportedly 1971 per patient    Past Surgical History:  Procedure Laterality Date  . Aoric valve replacement  January 2005   79mm Medtronic Freestyle stentless porcine bioprosthesis [facility])  . CARDIAC CATHETERIZATION     30-40% mid LAD, 20-30% ostial D1, proximal 30-40% RCA, 95% mid RCA thrombus, 40% distal stenosis. EF 45%, inferior HK.  [name] COLONOSCOPY N/A 04/20/2014   Procedure: COLONOSCOPY;  Surgeon: Rogene Houston, MD;  Location: [facility] END[facility] SUITE;  Service: Endoscopy;  Laterality: N/A;  930  . CORONARY ANGIOPLASTY WITH STENT PLACEMENT  07/2013  . LEFT HEART CATHETERIZATION WITH CORONARY  ANGIOGRAM N/A 08/10/2013   Procedure: LEFT HEART CATHETERIZATION WITH CORONARY ANGIOGRAM;  Surgeon: Blane Ohara, MD;  Location: [facility] CATH LAB;  Service: Cardiovascular;  Laterality: N/A;  . Left leg embolectomy  May 2012   Dr. Trula Slade - reason for lifelong coumadin     Current Outpatient Medications:  .  aspirin EC 81 MG tablet, Take 1 tablet (81 mg total) by mouth daily., Disp: 90 tablet, Rfl: 3 .  atorvastatin (LIPITOR) 40 MG tablet, Take 1 tablet (40 mg total) by mouth daily., Disp: 90 tablet, Rfl: 3 .  azelastine (ASTELIN) 0.1 % nasal spray, Place 2 sprays into both nostrils 2 (two) times daily. Use in each nostril as directed, Disp: 30 mL, Rfl: 3 .  azithromycin (ZITHROMAX) 250 MG tablet, Take two tablets on day one, then one tablet once daily for an additional four days, Disp: 6 tablet, Rfl: 0 .  benzonatate (TESSALON) 100 MG capsule, Take 1 capsule (100 mg total) by mouth 2 (two) times daily as needed for cough., Disp: 20 capsule, Rfl: 0 .  cholecalciferol (VITAMIN D) 1000 units tablet, Take 1 tablet (1,000 Units total) by mouth daily., Disp: 90 tablet, Rfl: 3 .  clopidogrel (PLAVIX) 75 MG tablet, TAKE 1 TABLET BY MOUTH ONCE DAILY, Disp: 90 tablet, Rfl:  1 .  furosemide (LASIX) 40 MG tablet, Take 1 tablet (40 mg total) by mouth daily., Disp: 90 tablet, Rfl: 3 .  lisinopril (PRINIVIL,ZESTRIL) 20 MG tablet, Take 1 tablet (20 mg total) by mouth daily., Disp: 90 tablet, Rfl: 3 .  metoprolol succinate (TOPROL-XL) 50 MG 24 hr tablet, Take 1 tablet (50 mg total) by mouth daily. Take with or immediately following a meal., Disp: 90 tablet, Rfl: 3 .  montelukast (SINGULAIR) 10 MG tablet, Take 1 tablet (10 mg total) by mouth at bedtime., Disp: 90 tablet, Rfl: 3 .  nitroGLYCERIN (NITROSTAT) 0.4 MG SL tablet, DISSOLVE ONE TABLET UNDER THE TONGUE EVERY 5 MINUTES AS NEEDED FOR CHEST PAIN.  DO NOT EXCEED A TOTAL OF 3 DOSES IN 15 MINUTES, Disp: 25 tablet, Rfl: 3  No Known Allergies       Objective:   Physical Exam  General: AAO x3, NAD  Dermatological: The left hallux toenail is hypertrophic, dystrophic with yellow to brown discoloration and there is incurvation present on both the medial lateral aspects.  There is tenderness palpation along the nail corners as well as the dorsal aspect of the toenail.  There is no edema, erythema, drainage or pus there is no clinical signs of infection noted today.  No open lesions.  Vascular: DP pulses 2/4, PT pulses decreased bilaterally there is no pain with calf compression, swelling, warmth, erythema.   Neruologic: Grossly intact via light touch bilateral.  Protective threshold with Semmes Wienstein monofilament intact to all pedal sites bilateral.   Musculoskeletal: Tenderness palpation to left hallux ingrown toenail he also points pointing to the bunion site.  There is no pain directly upon palpation of the bunion there is no pain or crepitation with MPJ range of motion.  There is no other areas of tenderness identified at this time.  Muscular strength 5/5 in all groups tested bilateral.  Gait: Unassisted, Nonantalgic.       Assessment:   Ingrown toenail left hallux, bunion     Plan:  -Treatment options discussed including all alternatives, risks, and complications -Etiology of symptoms were discussed -X-rays were obtained and reviewed with the patient.  Bunion deformities present there is no evidence of acute fracture, osteomyelitis or soft tissue emphysema. -I did amatory this tenderness is coming from the toenail itself.  We discussed options for nail including nail removal.  However prior to this I did check arterial studies to ensure adequate healing given his medical history and decreased posterior tibial pulses.  I did apply lidocaine cream to the toenail today and instructed to wear the nail without any complications.  I want her to use small moderate antibiotic ointment to the area daily as well as Epson salt soaks.  Monitoring signs or  symptoms of infection.  I will follow-up with him after the arterial studies or sooner if needed.  Trula Slade DPM

## 2018-09-23 NOTE — Telephone Encounter (Signed)
-----   Message from Trula Slade, DPM sent at 09/23/2018  7:00 AM EST ----- Can you please order arterial studies for him? Thanks.

## 2018-10-12 ENCOUNTER — Ambulatory Visit (INDEPENDENT_AMBULATORY_CARE_PROVIDER_SITE_OTHER): Payer: Medicare Other

## 2018-10-12 DIAGNOSIS — M21619 Bunion of unspecified foot: Secondary | ICD-10-CM

## 2018-10-12 DIAGNOSIS — R0989 Other specified symptoms and signs involving the circulatory and respiratory systems: Secondary | ICD-10-CM

## 2018-10-13 ENCOUNTER — Telehealth: Payer: Self-pay | Admitting: *Deleted

## 2018-10-13 NOTE — Telephone Encounter (Signed)
-----   Message from Trula Slade, DPM sent at 10/13/2018  7:12 AM EST ----- Val- please let him know there is significant decrease in circulation. Please schedule him to see Dr. Gwenlyn Found.

## 2018-10-13 NOTE — Telephone Encounter (Signed)
I informed pt of Dr. Leigh Aurora review of results and encouraged to keep the appt with Dr. Gwenlyn Found 10/26/2018 at 10:00am. Pt states he has already ordered his ride.

## 2018-10-26 ENCOUNTER — Ambulatory Visit: Payer: Medicare Other | Admitting: Cardiovascular Disease

## 2018-10-26 ENCOUNTER — Encounter: Payer: Self-pay | Admitting: Cardiovascular Disease

## 2018-10-26 DIAGNOSIS — I739 Peripheral vascular disease, unspecified: Secondary | ICD-10-CM | POA: Insufficient documentation

## 2018-10-26 NOTE — Patient Instructions (Signed)
    [place] [address] Dept: [id number] Loc: [phone]  [name]  10/26/2018  You are scheduled for a Peripheral Angiogram on Monday, February 10 with Dr. Quay Burow.  1. Please arrive at the [facility] (Main Entrance A) at [facility]: [address] [place], [place] [address] at 5:30 AM (This time is two hours before your procedure to ensure your preparation). Free valet parking service is available.   Special note: Every effort is made to have your procedure done on time. Please understand that emergencies sometimes delay scheduled procedures.  2. Diet: Do not eat solid foods after midnight.  The patient may have clear liquids until 5am upon the day of the procedure.  3. Labs: You will need to have blood drawn TODAY: CBC, BMP 4. Medication instructions in preparation for your procedure:   Do not take your furosemide on the day of the procedure  On the morning of your procedure, take your Plavix and any morning medicines NOT listed above.  You may use sips of water.  5. Plan for one night stay--bring personal belongings. 6. Bring a current list of your medications and current insurance cards. 7. You MUST have a responsible person to drive you home. 8. Someone MUST be with you the first 24 hours after you arrive home or your discharge will be delayed. 9. Please wear clothes that are easy to get on and off and wear slip-on shoes.  Thank you for allowing [place] to care for you!   -- [facility]  TESTS:  Your physician has requested that you have an aorta/iliac duplex. During this test, an ultrasound is used to evaluate blood flow to the aorta and iliac arteries. Allow one hour for this exam. Do not eat after midnight the day before and avoid carbonated beverages.  SCHEDULE 2 WEEKS AFTER 10/31/2018  Follow  up:  SCHEDULE 3 WEEKS AFTER 10/31/2018

## 2018-10-26 NOTE — H&P (View-Only) (Signed)
10/26/2018 [name]   03-08-1951  [id number]  Primary Physician [name], MD Primary Cardiologist: Lorretta Harp MD [name], [place], [place]  HPI:  [name] is a 68 y.o. thin and chronically ill-appearing divorced African-American male father 2 children who was referred by Dr. Jacqualyn Zhang, his podiatrist, for peripheral vascular valuation.  Dr. Domenic Zhang is his cardiologist in [place].  He has a history of CAD status post PCI of his RCA 08/10/2013.  His last cath performed by Dr. Burt Zhang 08/10/2013.  He has had left leg embolectomy by Dr. Trula Zhang May 2012.  His other problems include treated hypertension.  He smoked remotely.  He has had aortic valve replacement at [facility] and has severe aortic insufficiency.  He has been on Coumadin in the past but not currently.  He denies chest pain but does get short of breath.   Current Meds  Medication Sig  . aspirin EC 81 MG tablet Take 1 tablet (81 mg total) by mouth daily.  [name] atorvastatin (LIPITOR) 40 MG tablet Take 1 tablet (40 mg total) by mouth daily.  [name] azelastine (ASTELIN) 0.1 % nasal spray Place 2 sprays into both nostrils 2 (two) times daily. Use in each nostril as directed  . cholecalciferol (VITAMIN D) 1000 units tablet Take 1 tablet (1,000 Units total) by mouth daily.  . clopidogrel (PLAVIX) 75 MG tablet TAKE 1 TABLET BY MOUTH ONCE DAILY  . furosemide (LASIX) 40 MG tablet Take 1 tablet (40 mg total) by mouth daily.  [name] lisinopril (PRINIVIL,ZESTRIL) 20 MG tablet Take 1 tablet (20 mg total) by mouth daily.  . metoprolol succinate (TOPROL-XL) 50 MG 24 hr tablet Take 1 tablet (50 mg total) by mouth daily. Take with or immediately following a meal.  . montelukast (SINGULAIR) 10 MG tablet Take 1 tablet (10 mg total) by mouth at bedtime.  . nitroGLYCERIN (NITROSTAT) 0.4 MG SL tablet DISSOLVE ONE TABLET UNDER THE TONGUE EVERY 5 MINUTES AS NEEDED FOR CHEST PAIN.  DO NOT EXCEED A TOTAL OF 3 DOSES IN 15  MINUTES     No Known Allergies  Social History   Socioeconomic History  . Marital status: Single    Spouse name: Not on file  . Number of children: 2  . Years of education: Not on file  . Highest education level: 11th grade  Occupational History  . Not on file  Social Needs  . Financial resource strain: Somewhat hard  . Food insecurity:    Worry: Sometimes true    Inability: Sometimes true  . Transportation needs:    Medical: Yes    Non-medical: Yes  Tobacco Use  . Smoking status: Former Smoker    Packs/day: 0.50    Years: 3.00    Pack years: 1.50    Types: Cigarettes    Start date: 10/12/1965    Last attempt to quit: 02/19/1970    Years since quitting: 48.7  . Smokeless tobacco: Never Used  Substance and Sexual Activity  . Alcohol use: No    Alcohol/week: 10.0 standard drinks    Types: 10 Cans of beer per week  . Drug use: No  . Sexual activity: Not Currently  Lifestyle  . Physical activity:    Days per week: 3 days    Minutes per session: 20 min  . Stress: Only a little  Relationships  . Social connections:    Talks on phone: Once a week    Gets together: Three  times a week    Attends religious service: More than 4 times per year    Active member of club or organization: No    Attends meetings of clubs or organizations: Never    Relationship status: Divorced  . Intimate partner violence:    Fear of current or ex partner: No    Emotionally abused: No    Physically abused: No    Forced sexual activity: No  Other Topics Concern  . Not on file  Social History Narrative  . Not on file     Review of Systems: General: negative for chills, fever, night sweats or weight changes.  Cardiovascular: negative for chest pain, dyspnea on exertion, edema, orthopnea, palpitations, paroxysmal nocturnal dyspnea or shortness of breath Dermatological: negative for rash Respiratory: negative for cough or wheezing Urologic: negative for hematuria Abdominal: negative for  nausea, vomiting, diarrhea, bright red blood per rectum, melena, or hematemesis Neurologic: negative for visual changes, syncope, or dizziness All other systems reviewed and are otherwise negative except as noted above.    Blood pressure (!) 172/118, pulse (!) 54, height 5\' 4"  (1.626 m), weight 120 lb 9.6 oz (54.7 kg), SpO2 98 %.  General appearance: alert and no distress Neck: no adenopathy, no carotid bruit, no JVD, supple, symmetrical, trachea midline and thyroid not enlarged, symmetric, no tenderness/mass/nodules Lungs: clear to auscultation bilaterally Heart: 3/6 diastolic decrescendo murmur at the left upper sternal border consistent with aortic insufficiency Extremities: extremities normal, atraumatic, no cyanosis or edema Pulses: Diminished pedal pulses bilaterally Skin: Skin color, texture, turgor normal. No rashes or lesions Neurologic: Alert and oriented X 3, normal strength and tone. Normal symmetric reflexes. Normal coordination and gait  EKG not performed today  ASSESSMENT AND PLAN:   Peripheral arterial disease [facility]) [name] was referred by Dr. Jacqualyn Zhang, his podiatrist, for symptomatic left lower extremity claudication/resting pain.  He had Dopplers performed 10/12/2018 revealing a right ABI 0.67 and a left 2.84.  He did have severe tibial vessel disease on the right and a high-grade mid to distal left SFA stenosis.  He does not have critical limb ischemia/open wound but does have resting pain and has difficulty ambulating.  I am going arrange for him to have angiography and potential endovascular therapy.      Lorretta Harp MD FACP,FACC,FAHA, [facility] 10/26/2018 10:58 AM

## 2018-10-26 NOTE — Assessment & Plan Note (Signed)
[name] was referred by Dr. Jacqualyn Posey, his podiatrist, for symptomatic left lower extremity claudication/resting pain.  He had Dopplers performed 10/12/2018 revealing a right ABI 0.67 and a left 2.84.  He did have severe tibial vessel disease on the right and a high-grade mid to distal left SFA stenosis.  He does not have critical limb ischemia/open wound but does have resting pain and has difficulty ambulating.  I am going arrange for him to have angiography and potential endovascular therapy.

## 2018-10-26 NOTE — Progress Notes (Signed)
10/26/2018 [name]   [birth year]  [id number]  Primary Physician [name], MD Primary Cardiologist: Lorretta Harp MD Garret Reddish, [place], [place]  HPI:  [name] is a 68 y.o. thin and chronically ill-appearing divorced African-American male father 2 children who was referred by Dr. Jacqualyn Posey, his podiatrist, for peripheral vascular valuation.  Dr. Domenic Polite is his cardiologist in [place].  He has a history of CAD status post PCI of his RCA 08/10/2013.  His last cath performed by Dr. Burt Knack 08/10/2013.  He has had left leg embolectomy by Dr. Trula Slade May 2012.  His other problems include treated hypertension.  He smoked remotely.  He has had aortic valve replacement at [facility] and has severe aortic insufficiency.  He has been on Coumadin in the past but not currently.  He denies chest pain but does get short of breath.   Current Meds  Medication Sig  . aspirin EC 81 MG tablet Take 1 tablet (81 mg total) by mouth daily.  [name] atorvastatin (LIPITOR) 40 MG tablet Take 1 tablet (40 mg total) by mouth daily.  [name] azelastine (ASTELIN) 0.1 % nasal spray Place 2 sprays into both nostrils 2 (two) times daily. Use in each nostril as directed  . cholecalciferol (VITAMIN D) 1000 units tablet Take 1 tablet (1,000 Units total) by mouth daily.  . clopidogrel (PLAVIX) 75 MG tablet TAKE 1 TABLET BY MOUTH ONCE DAILY  . furosemide (LASIX) 40 MG tablet Take 1 tablet (40 mg total) by mouth daily.  [name] lisinopril (PRINIVIL,ZESTRIL) 20 MG tablet Take 1 tablet (20 mg total) by mouth daily.  . metoprolol succinate (TOPROL-XL) 50 MG 24 hr tablet Take 1 tablet (50 mg total) by mouth daily. Take with or immediately following a meal.  . montelukast (SINGULAIR) 10 MG tablet Take 1 tablet (10 mg total) by mouth at bedtime.  . nitroGLYCERIN (NITROSTAT) 0.4 MG SL tablet DISSOLVE ONE TABLET UNDER THE TONGUE EVERY 5 MINUTES AS NEEDED FOR CHEST PAIN.  DO NOT EXCEED A TOTAL OF 3 DOSES IN 15  MINUTES     No Known Allergies  Social History   Socioeconomic History  . Marital status: Single    Spouse name: Not on file  . Number of children: 2  . Years of education: Not on file  . Highest education level: 11th grade  Occupational History  . Not on file  Social Needs  . Financial resource strain: Somewhat hard  . Food insecurity:    Worry: Sometimes true    Inability: Sometimes true  . Transportation needs:    Medical: Yes    Non-medical: Yes  Tobacco Use  . Smoking status: Former Smoker    Packs/day: 0.50    Years: 3.00    Pack years: 1.50    Types: Cigarettes    Start date: 10/12/1965    Last attempt to quit: 02/19/1970    Years since quitting: 48.7  . Smokeless tobacco: Never Used  Substance and Sexual Activity  . Alcohol use: No    Alcohol/week: 10.0 standard drinks    Types: 10 Cans of beer per week  . Drug use: No  . Sexual activity: Not Currently  Lifestyle  . Physical activity:    Days per week: 3 days    Minutes per session: 20 min  . Stress: Only a little  Relationships  . Social connections:    Talks on phone: Once a week    Gets together: Three  times a week    Attends religious service: More than 4 times per year    Active member of club or organization: No    Attends meetings of clubs or organizations: Never    Relationship status: Divorced  . Intimate partner violence:    Fear of current or ex partner: No    Emotionally abused: No    Physically abused: No    Forced sexual activity: No  Other Topics Concern  . Not on file  Social History Narrative  . Not on file     Review of Systems: General: negative for chills, fever, night sweats or weight changes.  Cardiovascular: negative for chest pain, dyspnea on exertion, edema, orthopnea, palpitations, paroxysmal nocturnal dyspnea or shortness of breath Dermatological: negative for rash Respiratory: negative for cough or wheezing Urologic: negative for hematuria Abdominal: negative for  nausea, vomiting, diarrhea, bright red blood per rectum, melena, or hematemesis Neurologic: negative for visual changes, syncope, or dizziness All other systems reviewed and are otherwise negative except as noted above.    Blood pressure (!) 172/118, pulse (!) 54, height 5\' 4"  (1.626 m), weight 120 lb 9.6 oz (54.7 kg), SpO2 98 %.  General appearance: alert and no distress Neck: no adenopathy, no carotid bruit, no JVD, supple, symmetrical, trachea midline and thyroid not enlarged, symmetric, no tenderness/mass/nodules Lungs: clear to auscultation bilaterally Heart: 3/6 diastolic decrescendo murmur at the left upper sternal border consistent with aortic insufficiency Extremities: extremities normal, atraumatic, no cyanosis or edema Pulses: Diminished pedal pulses bilaterally Skin: Skin color, texture, turgor normal. No rashes or lesions Neurologic: Alert and oriented X 3, normal strength and tone. Normal symmetric reflexes. Normal coordination and gait  EKG not performed today  ASSESSMENT AND PLAN:   Peripheral arterial disease [facility]) Mr. Judson was referred by Dr. Jacqualyn Posey, his podiatrist, for symptomatic left lower extremity claudication/resting pain.  He had Dopplers performed 10/12/2018 revealing a right ABI 0.67 and a left 2.84.  He did have severe tibial vessel disease on the right and a high-grade mid to distal left SFA stenosis.  He does not have critical limb ischemia/open wound but does have resting pain and has difficulty ambulating.  I am going arrange for him to have angiography and potential endovascular therapy.      Lorretta Harp MD FACP,FACC,FAHA, [facility] 10/26/2018 10:58 AM

## 2018-10-27 ENCOUNTER — Telehealth: Payer: Self-pay | Admitting: *Deleted

## 2018-10-27 LAB — BASIC METABOLIC PANEL
BUN/Creatinine Ratio: 13 (ref 10–24)
BUN: 14 mg/dL (ref 8–27)
CO2: 23 mmol/L (ref 20–29)
CREATININE: 1.05 mg/dL (ref 0.76–1.27)
Calcium: 9.6 mg/dL (ref 8.6–10.2)
Chloride: 98 mmol/L (ref 96–106)
GFR calc Af Amer: 84 mL/min/{1.73_m2} (ref >59)
GFR, EST NON AFRICAN AMERICAN: 73 mL/min/{1.73_m2} (ref >59)
Glucose: 88 mg/dL (ref 65–99)
Potassium: 4.1 mmol/L (ref 3.5–5.2)
Sodium: 139 mmol/L (ref 134–144)

## 2018-10-27 LAB — CBC
Hematocrit: 43.2 % (ref 37.5–51.0)
Hemoglobin: 14.4 g/dL (ref 13.0–17.7)
MCH: 29.8 pg (ref 26.6–33.0)
MCHC: 33.3 g/dL (ref 31.5–35.7)
MCV: 89 fL (ref 79–97)
Platelets: 187 10*3/uL (ref 150–450)
RBC: 4.84 x10E6/uL (ref 4.14–5.80)
RDW: 13.3 % (ref 11.6–15.4)
WBC: 7.8 10*3/uL (ref 3.4–10.8)

## 2018-10-27 NOTE — Telephone Encounter (Signed)
Pt contacted pre-catheterization scheduled at [facility] for: Monday October 31, 2018 7:30 AM Verified arrival time and place: [facility] Main Entrance A at:5:30  No solid food after midnight prior to cath, clear liquids until 5 AM day of procedure. Contrast allergy: no  Hold Furosemide-AM of procedure  Except hold medications AM meds can be  taken pre-cath with sip of water including: ASA 81 mg Clopidogrel 75 mg  Confirmed patient has responsible person to drive home post procedure and observe 24 hours after arriving home: yes

## 2018-10-28 ENCOUNTER — Other Ambulatory Visit: Payer: Self-pay

## 2018-10-31 ENCOUNTER — Encounter (HOSPITAL_COMMUNITY)
Admission: RE | Disposition: A | Discharge status: Home / Self Care | Payer: Self-pay | Source: Ambulatory Visit | Attending: Cardiovascular Disease

## 2018-10-31 ENCOUNTER — Ambulatory Visit (HOSPITAL_COMMUNITY)
Admission: RE | Admit: 2018-10-31 | Discharge: 2018-11-01 | Disposition: A | Discharge status: Home / Self Care | Payer: Medicare Other | Source: Ambulatory Visit | Attending: Cardiovascular Disease | Admitting: Cardiovascular Disease

## 2018-10-31 ENCOUNTER — Other Ambulatory Visit: Payer: Self-pay

## 2018-10-31 DIAGNOSIS — I70412 Atherosclerosis of autologous vein bypass graft(s) of the extremities with intermittent claudication, left leg: Secondary | ICD-10-CM | POA: Diagnosis not present

## 2018-10-31 DIAGNOSIS — I10 Essential (primary) hypertension: Secondary | ICD-10-CM | POA: Diagnosis not present

## 2018-10-31 DIAGNOSIS — R0602 Shortness of breath: Secondary | ICD-10-CM | POA: Diagnosis not present

## 2018-10-31 DIAGNOSIS — Z952 Presence of prosthetic heart valve: Secondary | ICD-10-CM | POA: Diagnosis not present

## 2018-10-31 DIAGNOSIS — I061 Rheumatic aortic insufficiency: Secondary | ICD-10-CM | POA: Diagnosis not present

## 2018-10-31 DIAGNOSIS — I70401 Unspecified atherosclerosis of autologous vein bypass graft(s) of the extremities, right leg: Secondary | ICD-10-CM | POA: Diagnosis not present

## 2018-10-31 DIAGNOSIS — Z7982 Long term (current) use of aspirin: Secondary | ICD-10-CM | POA: Diagnosis not present

## 2018-10-31 DIAGNOSIS — Z87891 Personal history of nicotine dependence: Secondary | ICD-10-CM | POA: Diagnosis not present

## 2018-10-31 DIAGNOSIS — Z79899 Other long term (current) drug therapy: Secondary | ICD-10-CM | POA: Insufficient documentation

## 2018-10-31 DIAGNOSIS — I2510 Atherosclerotic heart disease of native coronary artery without angina pectoris: Secondary | ICD-10-CM | POA: Diagnosis not present

## 2018-10-31 DIAGNOSIS — I739 Peripheral vascular disease, unspecified: Secondary | ICD-10-CM | POA: Diagnosis present

## 2018-10-31 DIAGNOSIS — I728 Aneurysm of other specified arteries: Secondary | ICD-10-CM | POA: Diagnosis present

## 2018-10-31 DIAGNOSIS — E785 Hyperlipidemia, unspecified: Secondary | ICD-10-CM | POA: Insufficient documentation

## 2018-10-31 DIAGNOSIS — I70222 Atherosclerosis of native arteries of extremities with rest pain, left leg: Secondary | ICD-10-CM | POA: Diagnosis not present

## 2018-10-31 HISTORY — DX: Peripheral vascular disease, unspecified: I73.9

## 2018-10-31 HISTORY — PX: ABDOMINAL AORTOGRAM W/LOWER EXTREMITY: CATH118223

## 2018-10-31 HISTORY — PX: PERIPHERAL VASCULAR ATHERECTOMY: CATH118256

## 2018-10-31 LAB — POCT ACTIVATED CLOTTING TIME: Activated Clotting Time: 301 seconds

## 2018-10-31 SURGERY — ABDOMINAL AORTOGRAM W/LOWER EXTREMITY
Anesthesia: LOCAL

## 2018-10-31 MED ORDER — ONDANSETRON HCL 4 MG/2ML IJ SOLN
4.0000 mg | Freq: Four times a day (QID) | INTRAMUSCULAR | Status: DC | PRN
  Administered 2018-10-31: 4 mg via INTRAVENOUS
  Filled 2018-10-31: qty 2

## 2018-10-31 MED ORDER — SODIUM CHLORIDE 0.9 % WEIGHT BASED INFUSION
3.0000 mL/kg/h | INTRAVENOUS | Status: DC
  Administered 2018-10-31: 3 mL/kg/h via INTRAVENOUS

## 2018-10-31 MED ORDER — LOPERAMIDE HCL 2 MG PO CAPS
4.0000 mg | ORAL_CAPSULE | ORAL | Status: DC | PRN
  Administered 2018-10-31: 15:00:00 4 mg via ORAL
  Filled 2018-10-31: qty 2

## 2018-10-31 MED ORDER — HEPARIN SODIUM (PORCINE) 1000 UNIT/ML IJ SOLN
INTRAMUSCULAR | Status: DC | PRN
  Administered 2018-10-31: 7000 [IU] via INTRAVENOUS

## 2018-10-31 MED ORDER — NITROGLYCERIN 0.4 MG SL SUBL: 0.4000 mg | SUBLINGUAL_TABLET | SUBLINGUAL | Status: DC | PRN

## 2018-10-31 MED ORDER — ASPIRIN EC 81 MG PO TBEC: 81.0000 mg | DELAYED_RELEASE_TABLET | Freq: Every day | ORAL | Status: DC

## 2018-10-31 MED ORDER — LABETALOL HCL 5 MG/ML IV SOLN: 10.0000 mg | INTRAVENOUS | Status: DC | PRN

## 2018-10-31 MED ORDER — SODIUM CHLORIDE 0.9 % IV SOLN
INTRAVENOUS | Status: AC
  Administered 2018-10-31: 10:00:00 via INTRAVENOUS

## 2018-10-31 MED ORDER — CLOPIDOGREL BISULFATE 300 MG PO TABS
ORAL_TABLET | ORAL | Status: DC | PRN
  Administered 2018-10-31: 300 mg via ORAL

## 2018-10-31 MED ORDER — HEPARIN SODIUM (PORCINE) 1000 UNIT/ML IJ SOLN
INTRAMUSCULAR | Status: AC
  Filled 2018-10-31: qty 1

## 2018-10-31 MED ORDER — ACETAMINOPHEN 325 MG PO TABS: 650.0000 mg | ORAL_TABLET | ORAL | Status: DC

## 2018-10-31 MED ORDER — CLOPIDOGREL BISULFATE 75 MG PO TABS
75.0000 mg | ORAL_TABLET | Freq: Every day | ORAL | Status: DC
  Administered 2018-11-01: 11:00:00 75 mg via ORAL
  Filled 2018-10-31: qty 1

## 2018-10-31 MED ORDER — FENTANYL CITRATE (PF) 100 MCG/2ML IJ SOLN
INTRAMUSCULAR | Status: DC | PRN
  Administered 2018-10-31: 25 ug via INTRAVENOUS

## 2018-10-31 MED ORDER — LIDOCAINE HCL (PF) 1 % IJ SOLN
INTRAMUSCULAR | Status: AC
  Filled 2018-10-31: qty 30

## 2018-10-31 MED ORDER — LIDOCAINE HCL (PF) 1 % IJ SOLN
INTRAMUSCULAR | Status: DC | PRN
  Administered 2018-10-31: 15 mL via INTRADERMAL

## 2018-10-31 MED ORDER — SODIUM CHLORIDE 0.9 % WEIGHT BASED INFUSION: 1.0000 mL/kg/h | INTRAVENOUS | Status: DC

## 2018-10-31 MED ORDER — LISINOPRIL 10 MG PO TABS
20.0000 mg | ORAL_TABLET | Freq: Every day | ORAL | Status: DC
  Administered 2018-11-01: 11:00:00 20 mg via ORAL
  Filled 2018-10-31: qty 2

## 2018-10-31 MED ORDER — SODIUM CHLORIDE 0.9% FLUSH: 3.0000 mL | Freq: Two times a day (BID) | INTRAVENOUS | Status: DC

## 2018-10-31 MED ORDER — IODIXANOL 320 MG/ML IV SOLN
INTRAVENOUS | Status: DC | PRN
  Administered 2018-10-31: 170 mL via INTRA_ARTERIAL

## 2018-10-31 MED ORDER — FUROSEMIDE 40 MG PO TABS
40.0000 mg | ORAL_TABLET | Freq: Every day | ORAL | Status: DC
  Administered 2018-10-31 – 2018-11-01 (×2): 40 mg via ORAL
  Filled 2018-10-31 (×2): qty 1

## 2018-10-31 MED ORDER — GUAIFENESIN-DM 100-10 MG/5ML PO SYRP
5.0000 mL | ORAL_SOLUTION | ORAL | Status: DC | PRN
  Administered 2018-10-31: 19:00:00 5 mL via ORAL
  Filled 2018-10-31: qty 5

## 2018-10-31 MED ORDER — ATORVASTATIN CALCIUM 40 MG PO TABS
40.0000 mg | ORAL_TABLET | Freq: Every day | ORAL | Status: DC
  Administered 2018-10-31 – 2018-11-01 (×2): 40 mg via ORAL
  Filled 2018-10-31 (×2): qty 1

## 2018-10-31 MED ORDER — HEPARIN (PORCINE) IN NACL 1000-0.9 UT/500ML-% IV SOLN
INTRAVENOUS | Status: DC | PRN
  Administered 2018-10-31 (×2): 500 mL

## 2018-10-31 MED ORDER — METOPROLOL SUCCINATE ER 50 MG PO TB24
50.0000 mg | ORAL_TABLET | Freq: Every day | ORAL | Status: DC
  Administered 2018-11-01: 50 mg via ORAL
  Filled 2018-10-31: qty 1

## 2018-10-31 MED ORDER — CLOPIDOGREL BISULFATE 300 MG PO TABS
ORAL_TABLET | ORAL | Status: AC
  Filled 2018-10-31: qty 1

## 2018-10-31 MED ORDER — SODIUM CHLORIDE 0.9 % IV SOLN: 250.0000 mL | INTRAVENOUS | Status: DC | PRN

## 2018-10-31 MED ORDER — ASPIRIN 81 MG PO CHEW: 81.0000 mg | CHEWABLE_TABLET | ORAL | Status: DC

## 2018-10-31 MED ORDER — SODIUM CHLORIDE 0.9% FLUSH: 3.0000 mL | INTRAVENOUS | Status: DC | PRN

## 2018-10-31 MED ORDER — MIDAZOLAM HCL 2 MG/2ML IJ SOLN
INTRAMUSCULAR | Status: DC | PRN
  Administered 2018-10-31: 1 mg via INTRAVENOUS

## 2018-10-31 MED ORDER — ASPIRIN EC 81 MG PO TBEC
81.0000 mg | DELAYED_RELEASE_TABLET | Freq: Every day | ORAL | Status: DC
  Administered 2018-11-01: 81 mg via ORAL
  Filled 2018-10-31: qty 1

## 2018-10-31 MED ORDER — CLOPIDOGREL BISULFATE 75 MG PO TABS: 75.0000 mg | ORAL_TABLET | Freq: Every day | ORAL | Status: DC

## 2018-10-31 MED ORDER — ACETAMINOPHEN 325 MG PO TABS: 650.0000 mg | ORAL_TABLET | ORAL | Status: DC | PRN

## 2018-10-31 MED ORDER — HYDRALAZINE HCL 20 MG/ML IJ SOLN: 5.0000 mg | INTRAMUSCULAR | Status: DC | PRN

## 2018-10-31 MED ORDER — FENTANYL CITRATE (PF) 100 MCG/2ML IJ SOLN
INTRAMUSCULAR | Status: AC
  Filled 2018-10-31: qty 2

## 2018-10-31 MED ORDER — MIDAZOLAM HCL 2 MG/2ML IJ SOLN
INTRAMUSCULAR | Status: AC
  Filled 2018-10-31: qty 2

## 2018-10-31 MED ORDER — CLOPIDOGREL BISULFATE 75 MG PO TABS: 75.0000 mg | ORAL_TABLET | ORAL | Status: DC

## 2018-10-31 MED ORDER — HEPARIN (PORCINE) IN NACL 1000-0.9 UT/500ML-% IV SOLN
INTRAVENOUS | Status: AC
  Filled 2018-10-31: qty 1000

## 2018-10-31 MED ORDER — SODIUM CHLORIDE 0.9% FLUSH
3.0000 mL | Freq: Two times a day (BID) | INTRAVENOUS | Status: DC
  Administered 2018-10-31: 18:00:00 3 mL via INTRAVENOUS

## 2018-10-31 SURGICAL SUPPLY — 25 items
BALLN IN.PACT DCB 6X60 (BALLOONS) ×3
CATH ANGIO 5F PIGTAIL 65CM (CATHETERS) ×3 IMPLANT
CATH CROSS OVER TEMPO 5F (CATHETERS) ×3 IMPLANT
CATH HAWKONE LX EXTENDED TIP (CATHETERS) ×3 IMPLANT
CLOSURE MYNX CONTROL 6F/7F (Vascular Products) ×3 IMPLANT
DCB IN.PACT 6X60 (BALLOONS) ×2 IMPLANT
DEVICE CONTINUOUS FLUSH (MISCELLANEOUS) ×3 IMPLANT
DEVICE SPIDERFX EMB PROT 6MM (WIRE) ×3 IMPLANT
GUIDEWIRE ANGLED .035X150CM (WIRE) ×3 IMPLANT
KIT ENCORE 26 ADVANTAGE (KITS) ×3 IMPLANT
KIT PV (KITS) ×3 IMPLANT
SHEATH HIGHFLEX ANSEL 7FR 55CM (SHEATH) ×3 IMPLANT
SHEATH PINNACLE 5F 10CM (SHEATH) ×3 IMPLANT
SHEATH PINNACLE 7F 10CM (SHEATH) ×3 IMPLANT
SHEATH PROBE COVER 6X72 (BAG) ×3 IMPLANT
STOPCOCK MORSE 400PSI 3WAY (MISCELLANEOUS) ×3 IMPLANT
SYR MEDRAD MARK 7 150ML (SYRINGE) ×3 IMPLANT
TAPE VIPERTRACK RADIOPAQ (MISCELLANEOUS) ×2 IMPLANT
TAPE VIPERTRACK RADIOPAQUE (MISCELLANEOUS) ×1
TRANSDUCER W/STOPCOCK (MISCELLANEOUS) ×3 IMPLANT
TRAY PV CATH (CUSTOM PROCEDURE TRAY) ×3 IMPLANT
TUBING CIL FLEX 10 FLL-RA (TUBING) ×3 IMPLANT
WIRE HITORQ VERSACORE ST 145CM (WIRE) ×3 IMPLANT
WIRE ROSEN-J .035X260CM (WIRE) ×3 IMPLANT
WIRE SPARTACORE .014X300CM (WIRE) ×3 IMPLANT

## 2018-10-31 NOTE — Interval H&P Note (Signed)
History and Physical Interval Note:  10/31/2018 7:35 AM  [name]  has presented today for surgery, with the diagnosis of pvd  The various methods of treatment have been discussed with the patient and family. After consideration of risks, benefits and other options for treatment, the patient has consented to  Procedure(s): ABDOMINAL AORTOGRAM W/LOWER EXTREMITY (N/A) as a surgical intervention .  The patient's history has been reviewed, patient examined, no change in status, stable for surgery.  I have reviewed the patient's chart and labs.  Questions were answered to the patient's satisfaction.     Quay Burow

## 2018-11-01 ENCOUNTER — Encounter (HOSPITAL_COMMUNITY): Payer: Self-pay | Admitting: Cardiovascular Disease

## 2018-11-01 DIAGNOSIS — E785 Hyperlipidemia, unspecified: Secondary | ICD-10-CM | POA: Diagnosis not present

## 2018-11-01 DIAGNOSIS — I739 Peripheral vascular disease, unspecified: Secondary | ICD-10-CM | POA: Diagnosis not present

## 2018-11-01 DIAGNOSIS — I10 Essential (primary) hypertension: Secondary | ICD-10-CM | POA: Diagnosis not present

## 2018-11-01 DIAGNOSIS — Z952 Presence of prosthetic heart valve: Secondary | ICD-10-CM | POA: Diagnosis not present

## 2018-11-01 DIAGNOSIS — I70222 Atherosclerosis of native arteries of extremities with rest pain, left leg: Secondary | ICD-10-CM | POA: Diagnosis not present

## 2018-11-01 DIAGNOSIS — Z7982 Long term (current) use of aspirin: Secondary | ICD-10-CM | POA: Diagnosis not present

## 2018-11-01 DIAGNOSIS — I2510 Atherosclerotic heart disease of native coronary artery without angina pectoris: Secondary | ICD-10-CM | POA: Diagnosis not present

## 2018-11-01 DIAGNOSIS — R0602 Shortness of breath: Secondary | ICD-10-CM | POA: Diagnosis not present

## 2018-11-01 DIAGNOSIS — Z87891 Personal history of nicotine dependence: Secondary | ICD-10-CM | POA: Diagnosis not present

## 2018-11-01 DIAGNOSIS — Z79899 Other long term (current) drug therapy: Secondary | ICD-10-CM | POA: Diagnosis not present

## 2018-11-01 DIAGNOSIS — I061 Rheumatic aortic insufficiency: Secondary | ICD-10-CM | POA: Diagnosis not present

## 2018-11-01 LAB — BASIC METABOLIC PANEL
Anion gap: 10 (ref 5–15)
BUN: 20 mg/dL (ref 8–23)
CO2: 24 mmol/L (ref 22–32)
Calcium: 8.9 mg/dL (ref 8.9–10.3)
Chloride: 104 mmol/L (ref 98–111)
Creatinine, Ser: 1.23 mg/dL (ref 0.61–1.24)
GFR calc Af Amer: >60 mL/min (ref >60)
GFR calc non Af Amer: >60 mL/min (ref >60)
Glucose, Bld: 99 mg/dL (ref 70–99)
Potassium: 4.3 mmol/L (ref 3.5–5.1)
Sodium: 138 mmol/L (ref 135–145)

## 2018-11-01 LAB — CBC
HCT: 32 % — ABNORMAL LOW (ref 39.0–52.0)
Hemoglobin: 10.5 g/dL — ABNORMAL LOW (ref 13.0–17.0)
MCH: 28.9 pg (ref 26.0–34.0)
MCHC: 32.8 g/dL (ref 30.0–36.0)
MCV: 88.2 fL (ref 80.0–100.0)
Platelets: 149 10*3/uL — ABNORMAL LOW (ref 150–400)
RBC: 3.63 MIL/uL — ABNORMAL LOW (ref 4.22–5.81)
RDW: 13.2 % (ref 11.5–15.5)
WBC: 11.7 10*3/uL — ABNORMAL HIGH (ref 4.0–10.5)
nRBC: 0 % (ref 0.0–0.2)

## 2018-11-01 MED ORDER — ANGIOPLASTY BOOK
Freq: Once | Status: AC
  Administered 2018-11-01: 12:00:00
  Filled 2018-11-01: qty 1

## 2018-11-01 NOTE — Discharge Summary (Addendum)
Discharge Summary    Patient ID: [name] MRN: [id number]; DOB: [birth year]  Admit date: 10/31/2018 Discharge date: 11/01/2018  Primary Care Provider: [name], MD  Primary Cardiologist: Rozann Lesches, MD   Discharge Diagnoses    Principal Problem:   Peripheral arterial disease [facility]) Active Problems:   Essential hypertension, benign   Hepatic artery aneurysm (HCC)   S/P aortic valve replacement   Coronary atherosclerosis of native coronary artery   Claudication in peripheral vascular disease (HCC)  Allergies No Known Allergies  Diagnostic Studies/Procedures    Abdominal aortogram with lower extremity peripheral vascular arthrectomy 10/31/2018:  Angiographic Data:   1: Abdominal aorta- tortuous but not atherosclerotic or aneurysmal 2: Left lower extremity- 50% proximal left SFA, 95% mid left SFA, one-vessel runoff via the posterior tibial artery 3: Right lower extremity- 95% mid right SFA with 0 vessel runoff   IMPRESSION: Mr. Coiner has a high-grade mid left SFA stenosis with one-vessel runoff.  He has claudication.  We will proceed with directional atherectomy followed by drug-coated balloon angioplasty using spider distal protection.  Procedure Description: I obtain contralateral access with a crossover catheter, Glidewire, 7 [place] multipurpose 55 cm Ansell sheath and a Rosen wire.  Patient received a total of 7000 units of heparin with an ACT of 301.  Total contrast administered to the patient was 170 cc.  I placed a 6 mm spider distal protection device in the left above-knee popliteal artery.  I then performed directional atherectomy with a Hawk 1 LX device performing multiple circumferential cuts removing a copious amount of atherosclerotic plaque.  Following this I performed drug-coated balloon angioplasty with a 6 mm x 60 mm Medtronic impact Admiral DCB at 3 atm for 2 minutes resulting reduction with 95% stenosis to 0% residual without dissection.   Spot angiography of the tibial vessels revealed the posterior tibial to be intact.  The spider distal protection device was then successfully captured.  The sheath was withdrawn across the bifurcation and exchanged over an 035 versa core wire for a short 7 [place] sheath.  A femoral angiogram was performed and a 6/7 mm MYNX hemostatic device was then successfully deployed achieving hemostasis.  Final Impression: Successful Hawk 1 directional atherectomy followed by drug-coated balloon angioplasty of a high-grade mid left SFA stenosis with one-vessel runoff in the setting of lifestyle limiting claudication.  The patient tolerated procedure well.  His groin was successfully sealed with a MYNX closure device.  The patient was gently hydrated, discharged home in the morning on dual antiplatelet therapy.  We will obtain lower extremity arterial Doppler studies in our [facility] line office next week and I will see him back 2 to 3 weeks thereafter.  History of Present Illness     [name] is a 68 y.o. M who was referred to Dr. Gwenlyn Found by Dr. Jacqualyn Posey, his podiatrist, for peripheral vascular valuation.  Dr. Domenic Polite is his cardiologist in [place].  He has a history of CAD status post PCI of his RCA 08/10/2013.  His last cath performed by Dr. Burt Knack 08/10/2013.  He has had left leg embolectomy by Dr. Trula Slade May 2012.  His other problems include treated hypertension.  He smoked remotely.  He has had aortic valve replacement at [facility] and has severe aortic insufficiency.  He has been on Coumadin in the past but not currently.  He denies chest pain but does get short of breath.  Hospital Course   He had Dopplers performed 10/12/2018 revealing  a right ABI 0.67 and a left 2.84.  He did have severe tibial vessel disease on the right and a high-grade mid to distal left SFA stenosis.  He does not have critical limb ischemia/open wound but does have resting pain and has difficulty ambulating.   Per chart review, Dr. Gwenlyn Found arranged for elective angiography and potential endovascular therapy, scheduled for 10/31/2018 which revealed a torturous but not atherosclerotic or aneurysmal abdominal aorta, 50% proximal left SFA, 95% mid left SFA, one-vessel runoff via the posterior tibial artery and 95% mid right SFA with 0 vessel runoff. Successful Hawk 1 directional atherectomy followed by drug-coated balloon angioplasty of a high-grade mid left SFA stenosis with one-vessel runoff in the setting of lifestyle limiting claudication.  His groin was successfully sealed with MYNX closure device.  Right groin unremarkable on exam today.  Plan is for follow-up Doppler study on 11/22/2018 at 9 AM.  Follow-up appointment with Dr. Gwenlyn Found will be on 12/06/2018 at 1015.   General: Well developed, well nourished, NAD Skin: Warm, dry, intact  Head: Normocephalic, atraumatic, clear, moist mucus membranes. Neck: Negative for carotid bruits. No JVD Lungs:Clear to ausculation bilaterally. No wheezes, rales, or rhonchi. Breathing is unlabored. Cardiovascular: RRR with S1 S2. No murmurs, rubs, gallops, or LV heave appreciated. Abdomen: Soft, non-tender, non-distended with normoactive bowel sounds. No obvious abdominal masses. MSK: Strength and tone appear normal for age. 5/5 in all extremities Extremities: No edema. No clubbing or cyanosis. DP/PT pulses 2+ bilaterally Neuro: Alert and oriented. No focal deficits. No facial asymmetry. MAE spontaneously. Psych: Responds to questions appropriately with normal affect.    Consultants: None   Patient was seen and examined by Dr. Irish Lack who feels that he is stable and ready for discharge today, 11/01/2018. _____________  Discharge Vitals Blood pressure (!) 145/75, pulse 73, temperature 97.6 F (36.4 C), temperature source Oral, resp. rate 18, height _0  (1.626 m), weight 56.7 kg, SpO2 95 %.  Filed Weights   10/31/18 0558  Weight: 56.7 kg   Labs & Radiologic Studies      CBC Recent Labs    11/01/18 0601  WBC 11.7*  HGB 10.5*  HCT 32.0*  MCV 88.2  PLT 026*   Basic Metabolic Panel Recent Labs    11/01/18 0601  NA 138  K 4.3  CL 104  CO2 24  GLUCOSE 99  BUN 20  CREATININE 1.23  CALCIUM 8.9  _____________  [place] Ram [place] Le Art Seg Multi (segm&le Reynauds)  Result Date: 10/12/2018 LOWER EXTREMITY DOPPLER STUDY Indications: Claudication, and Pain left great toe, Decreased pedal pulses. High Risk Factors: Hypertension, hyperlipidemia, past history of smoking,                    coronary artery disease. Other Factors: Patient complians of pain in both legs with walking, left >                right. He has pain in his left great toe from a bunion and an                ingrown toenail. His claudication symptoms have been getting                worse over the last three months. He had decreased pedal pulses,                bilaterally on physical exam.  Performing Technologist: McFatter, Mandy RVT  Examination Guidelines: A complete evaluation includes at minimum, Doppler  waveform signals and systolic blood pressure reading at the level of bilateral brachial, anterior tibial, and posterior tibial arteries, when vessel segments are accessible. Bilateral testing is considered an integral part of a complete examination. Photoelectric Plethysmograph (PPG) waveforms and toe systolic pressure readings are included as required and additional duplex testing as needed. Limited examinations for reoccurring indications may be performed as noted.  ABI Findings: +---------+------------------+-----+----------+--------+ Right    Rt Pressure (mmHg)IndexWaveform  Comment  +---------+------------------+-----+----------+--------+ Brachial 155                    biphasic           +---------+------------------+-----+----------+--------+ CFA                             triphasic          +---------+------------------+-----+----------+--------+ Popliteal                        triphasic          +---------+------------------+-----+----------+--------+ ATA      80                0.52 monophasic         +---------+------------------+-----+----------+--------+ PTA      92                0.59 monophasic         +---------+------------------+-----+----------+--------+ PERO     104               0.67 monophasic         +---------+------------------+-----+----------+--------+ Great Toe65                0.42 Abnormal           +---------+------------------+-----+----------+--------+ +---------+------------------+-----+----------+-------+ Left     Lt Pressure (mmHg)IndexWaveform  Comment +---------+------------------+-----+----------+-------+ Brachial 152                    biphasic          +---------+------------------+-----+----------+-------+ CFA                             triphasic         +---------+------------------+-----+----------+-------+ Popliteal                       biphasic          +---------+------------------+-----+----------+-------+ ATA      130               0.84 monophasic        +---------+------------------+-----+----------+-------+ PTA      125               0.81 monophasic        +---------+------------------+-----+----------+-------+ PERO     88                0.57 monophasic        +---------+------------------+-----+----------+-------+ Great Toe61                0.39 Abnormal          +---------+------------------+-----+----------+-------+ The digit waveforms are abnormal, bilaterally. See Arterial Duplex study  Summary: Right: Resting right ankle-brachial index indicates moderate right lower extremity arterial disease. The right toe-brachial index is abnormal. Left: Resting left ankle-brachial index indicates mild left lower extremity arterial disease. The left toe-brachial index is  abnormal.  *See table(s) above for measurements and observations.  Electronically signed by Ida Rogue  MD on 10/12/2018 at 6:45:53 PM.    Final    Vas [place] Lower Extremity Arterial Duplex  Result Date: 10/12/2018 LOWER EXTREMITY ARTERIAL DUPLEX STUDY Indications: Claudication, and Decreased pedal pulses. High Risk Factors: Hypertension, hyperlipidemia, past history of smoking,                    coronary artery disease. Other Factors: Patient complians of pain in both legs with walking, left >                right. He has pain in his left great toe from a bunion and an                ingrown toenail. His claudication symptoms have been getting                worse over the last three months. He had decreased pedal pulses,                bilaterally on physical exam.  Current ABI: Right 0.67, Left 0.84 Performing Technologist: Jeneen Montgomery RDMS, RVT, RDCS  Examination Guidelines: A complete evaluation includes B-mode imaging, spectral Doppler, color Doppler, and power Doppler as needed of all accessible portions of each vessel. Bilateral testing is considered an integral part of a complete examination. Limited examinations for reoccurring indications may be performed as noted.  Right Duplex Findings: +-----------+--------+-----+---------------+----------+--------+            PSV cm/sRatioStenosis       Waveform  Comments +-----------+--------+-----+---------------+----------+--------+ CFA Distal 131                         triphasic          +-----------+--------+-----+---------------+----------+--------+ DFA        174                         biphasic           +-----------+--------+-----+---------------+----------+--------+ SFA Prox   156          30-49% stenosistriphasic          +-----------+--------+-----+---------------+----------+--------+ SFA Mid    75                          triphasic          +-----------+--------+-----+---------------+----------+--------+ SFA Distal 60                          biphasic            +-----------+--------+-----+---------------+----------+--------+ POP Prox   279          50-74% stenosisbiphasic           +-----------+--------+-----+---------------+----------+--------+ POP Distal 36                          biphasic           +-----------+--------+-----+---------------+----------+--------+ ATA Prox   66                                             +-----------+--------+-----+---------------+----------+--------+ ATA Mid  occluded                          +-----------+--------+-----+---------------+----------+--------+ ATA Distal 46                          monophasic         +-----------+--------+-----+---------------+----------+--------+ PTA Prox   58                                             +-----------+--------+-----+---------------+----------+--------+ PTA Mid                 occluded                          +-----------+--------+-----+---------------+----------+--------+ PTA Distal 41                          monophasic         +-----------+--------+-----+---------------+----------+--------+ PERO Prox  68                                             +-----------+--------+-----+---------------+----------+--------+ PERO Mid   23                                             +-----------+--------+-----+---------------+----------+--------+ PERO Distal14                          monophasic         +-----------+--------+-----+---------------+----------+--------+ A focal velocity elevation of 279 cm/s was obtained at prox right pop with a VR of 6.2. Findings are characteristic of 50-74% stenosis.  Left Duplex Findings: +-----------+--------+-----+---------------+----------+-------------+            PSV cm/sRatioStenosis       Waveform  Comments      +-----------+--------+-----+---------------+----------+-------------+ CFA Distal 122                         triphasic                +-----------+--------+-----+---------------+----------+-------------+ DFA        81                          triphasic               +-----------+--------+-----+---------------+----------+-------------+ SFA Prox   132                         triphasic               +-----------+--------+-----+---------------+----------+-------------+ SFA Mid    563          75-99% stenosismonophasicMid to Distal +-----------+--------+-----+---------------+----------+-------------+ SFA Distal 54                          biphasic                +-----------+--------+-----+---------------+----------+-------------+ POP Prox   31  biphasic                +-----------+--------+-----+---------------+----------+-------------+ POP Distal 27                          biphasic                +-----------+--------+-----+---------------+----------+-------------+ ATA Prox   45                          biphasic                +-----------+--------+-----+---------------+----------+-------------+ ATA Distal              occluded                               +-----------+--------+-----+---------------+----------+-------------+ PTA Prox   64                                                  +-----------+--------+-----+---------------+----------+-------------+ PTA Mid    87                                                  +-----------+--------+-----+---------------+----------+-------------+ PTA Distal 52                          monophasic              +-----------+--------+-----+---------------+----------+-------------+ PERO Prox  21                          biphasic                +-----------+--------+-----+---------------+----------+-------------+ PERO Distal             occluded                               +-----------+--------+-----+---------------+----------+-------------+ A focal velocity elevation of 563 cm/s was obtained at  mid-dist left SFA with post stenotic turbulence with a VR of 8.2. Findings are characteristic of 75-99% stenosis.  Summary: Right: 30-49% stenosis noted in the superficial femoral artery. 50-74% stenosis noted in the popliteal artery. Total occlusion noted in the anterior tibial artery. Total occlusion noted in the posterior tibial artery. Calcific and homogeneous plaque seen throughout right leg. Left: 75-99% stenosis noted in the superficial femoral artery. Total occlusion noted in the anterior tibial artery. Total occlusion noted in the peroneal artery. Calcific and homogeneous plaque seen throughout left leg. Patient has an appointment scheduled with Dr Gwenlyn Found on 10/26/18.  See table(s) above for measurements and observations. Vascular consult recommended. Electronically signed by Ida Rogue MD on 10/12/2018 at 6:44:54 PM.    Final    Disposition   Pt is being discharged home today in good condition.  Follow-up Plans & Appointments   Follow-up Information    [facility] Follow up on 11/22/2018.   Specialty:  Cardiology Why:  Follow-up Doppler studies will be on 11/22/2018 at 9 AM. Contact information: [place]  [place] [phone]       Lorretta Harp, MD Follow up on 12/06/2018.   Specialties:  Cardiology, Radiology Why:  Your follow-up appointment with Dr. Gwenlyn Found will be on 12/06/2018 at 1015. Contact information: [address] [place] [address] [phone]          Discharge Instructions    Call MD for:  difficulty breathing, headache or visual disturbances   Complete by:  As directed    Call MD for:  extreme fatigue   Complete by:  As directed    Call MD for:  hives   Complete by:  As directed    Call MD for:  persistant dizziness or light-headedness   Complete by:  As directed    Call MD for:  persistant nausea and vomiting   Complete by:  As directed    Call MD for:  redness, tenderness, or  signs of infection (pain, swelling, redness, odor or green/yellow discharge around incision site)   Complete by:  As directed    Call MD for:  severe uncontrolled pain   Complete by:  As directed    Call MD for:  temperature >100.4   Complete by:  As directed    Diet - low sodium heart healthy   Complete by:  As directed    Discharge instructions   Complete by:  As directed    No driving for 3 days. No lifting over 5 lbs for 1 week. No sexual activity for 1 week. Keep procedure site clean & dry. If you notice increased pain, swelling, bleeding or pus, call/return!  You may shower, but no soaking baths/hot tubs/pools for 1 week.   If you notice any bleeding such as blood in stool, black tarry stools, blood in urine, nosebleeds or any other unusual bleeding, call your doctor immediately. It is not normal to have this kind of bleeding while on a blood thinner and usually indicates there is an underlying problem with one of your body systems that needs to be checked out.   Increase activity slowly   Complete by:  As directed      Discharge Medications   Allergies as of 11/01/2018   No Known Allergies     Medication List    TAKE these medications   acetaminophen 325 MG tablet Commonly known as:  TYLENOL Take 650 mg by mouth every morning.   aspirin EC 81 MG tablet Take 1 tablet (81 mg total) by mouth daily.   atorvastatin 40 MG tablet Commonly known as:  LIPITOR Take 1 tablet (40 mg total) by mouth daily.   azelastine 0.1 % nasal spray Commonly known as:  ASTELIN Place 2 sprays into both nostrils 2 (two) times daily. Use in each nostril as directed   cholecalciferol 25 MCG (1000 UT) tablet Commonly known as:  VITAMIN D Take 1 tablet (1,000 Units total) by mouth daily.   clopidogrel 75 MG tablet Commonly known as:  PLAVIX TAKE 1 TABLET BY MOUTH ONCE DAILY   furosemide 40 MG tablet Commonly known as:  LASIX Take 1 tablet (40 mg total) by mouth daily.   lisinopril 20 MG  tablet Commonly known as:  PRINIVIL,ZESTRIL Take 1 tablet (20 mg total) by mouth daily.   metoprolol succinate 50 MG 24 hr tablet Commonly known as:  TOPROL-XL Take 1 tablet (50 mg total) by mouth daily. Take with or immediately following a meal.   montelukast 10 MG tablet Commonly known as:  SINGULAIR Take  1 tablet (10 mg total) by mouth at bedtime.   nitroGLYCERIN 0.4 MG SL tablet Commonly known as:  NITROSTAT DISSOLVE ONE TABLET UNDER THE TONGUE EVERY 5 MINUTES AS NEEDED FOR CHEST PAIN.  DO NOT EXCEED A TOTAL OF 3 DOSES IN 15 MINUTES What changed:  See the new instructions.        Acute coronary syndrome (MI, NSTEMI, STEMI, etc) this admission?: No.    Outstanding Labs/Studies   Doppler study  Duration of Discharge Encounter   Greater than 30 minutes including physician time.  Signed, Kathyrn Drown, NP 11/01/2018, 8:59 AM  I have examined the patient and reviewed assessment and plan and discussed with patient.  Agree with above as stated.    GEN: Well nourished, well developed, in no acute distress  HEENT: normal  Neck: no JVD, carotid bruits, or masses Cardiac: RRR; 2/6 diastolicheart murmur, no rubs, or gallops,no edema  Respiratory:  clear to auscultation bilaterally, normal work of breathing GI: soft, nontender, nondistended,  MS: no deformity or atrophy , Right groin without hematoma- 2+ right femoral pulse; 2+ PT pulses bilaterally Skin: warm and dry, no rash Neuro:  Strength and sensation are intact Psych: euthymic mood, full affect  Continue aspirin and clopidogrel without interruption.  His murmur is followed per his report.   COntinue aggressive secondary prevention.  He does not smoke.   OK for discharge today.  Larae Grooms

## 2018-11-11 ENCOUNTER — Other Ambulatory Visit: Payer: Self-pay | Admitting: Family Medicine

## 2018-11-21 ENCOUNTER — Other Ambulatory Visit (HOSPITAL_COMMUNITY): Payer: Self-pay | Admitting: Student

## 2018-11-21 ENCOUNTER — Other Ambulatory Visit (HOSPITAL_COMMUNITY): Payer: Self-pay | Admitting: Cardiovascular Disease

## 2018-11-21 DIAGNOSIS — I739 Peripheral vascular disease, unspecified: Secondary | ICD-10-CM

## 2018-11-22 ENCOUNTER — Ambulatory Visit (HOSPITAL_COMMUNITY): Admission: RE | Admit: 2018-11-22 | Payer: Medicare Other | Source: Ambulatory Visit

## 2018-11-22 ENCOUNTER — Other Ambulatory Visit: Payer: Self-pay | Admitting: Cardiovascular Disease

## 2018-11-22 ENCOUNTER — Ambulatory Visit (HOSPITAL_COMMUNITY)
Admission: RE | Admit: 2018-11-22 | Discharge: 2018-11-22 | Disposition: A | Discharge status: Home / Self Care | Payer: Medicare Other | Source: Ambulatory Visit | Attending: Cardiology | Admitting: Cardiology

## 2018-11-22 DIAGNOSIS — I739 Peripheral vascular disease, unspecified: Secondary | ICD-10-CM

## 2018-11-22 DIAGNOSIS — Z9862 Peripheral vascular angioplasty status: Secondary | ICD-10-CM | POA: Diagnosis not present

## 2018-11-30 ENCOUNTER — Telehealth: Payer: Self-pay

## 2018-11-30 DIAGNOSIS — I739 Peripheral vascular disease, unspecified: Secondary | ICD-10-CM

## 2018-11-30 NOTE — Telephone Encounter (Signed)
Patient aware of LEA results. Future orders placed for repeat LEA with ABI in 6 months and message routed to scheduling.

## 2018-12-06 ENCOUNTER — Encounter: Payer: Self-pay | Admitting: Cardiovascular Disease

## 2018-12-06 ENCOUNTER — Ambulatory Visit: Payer: Medicare Other | Admitting: Cardiovascular Disease

## 2018-12-06 ENCOUNTER — Other Ambulatory Visit: Payer: Self-pay

## 2018-12-06 VITALS — BP 127/51 | HR 52 | Ht 64.0 in | Wt 123.8 lb

## 2018-12-06 DIAGNOSIS — I739 Peripheral vascular disease, unspecified: Secondary | ICD-10-CM

## 2018-12-06 DIAGNOSIS — R011 Cardiac murmur, unspecified: Secondary | ICD-10-CM

## 2018-12-06 NOTE — Assessment & Plan Note (Signed)
History of peripheral arterial disease status post lower extremity angiography and intervention by myself 10/31/2018 with demonstration of high-grade SFA disease bilaterally.  He underwent directional atherectomy and drug-coated balloon angioplasty of his left SFA.  His follow-up Dopplers performed 11/22/2018 showed marked improvement on the left side with a left ABI of 0.98.  His claudication has resolved.  He remains on aspirin Plavix.  He has no symptoms on the right despite his disease.  We will get Dopplers on him every 6 months.

## 2018-12-06 NOTE — Progress Notes (Signed)
12/06/2018 [name]   07/23/1951  [id number]  Primary Physician [name], MD Primary Cardiologist: Lorretta Harp MD Garret Reddish, [place], [place]  HPI:  [name] is a 68 y.o.   thin and chronically ill-appearing divorced African-American male father 2 children who was referred by Dr. Jacqualyn Posey, his podiatrist, for peripheral vascular valuation.  I last saw him in the office 10/26/2018. Dr. Domenic Polite is his cardiologist in [place] .  He has a history of CAD status post PCI of his RCA 08/10/2013.  His last cath performed by Dr. Burt Knack 08/10/2013.  He has had left leg embolectomy by Dr. Trula Slade May 2012.  His other problems include treated hypertension.  He smoked remotely.  He has had aortic valve replacement at [facility] and has severe aortic insufficiency.  He has been on Coumadin in the past but not currently.  He denies chest pain but does get short of breath. I performed Doppler studies on him revealing bilateral SFA and tibial vessel disease.  Ultimately underwent angiography and left SFA intervention by myself 10/31/2018 using a [facility] 1 directional atherectomy followed by drug-coated balloon angioplasty.  Follow-up Dopplers performed 11/22/2018 revealed improvement in his left ABI of .98 with widely patent left SFA.  His left lower extremity claudication symptoms have resolved.  Current Meds  Medication Sig  . acetaminophen (TYLENOL) 325 MG tablet Take 650 mg by mouth every morning.  [name] aspirin EC 81 MG tablet Take 1 tablet (81 mg total) by mouth daily.  [name] atorvastatin (LIPITOR) 40 MG tablet TAKE 1 TABLET BY MOUTH ONCE DAILY  . azelastine (ASTELIN) 0.1 % nasal spray Place 2 sprays into both nostrils 2 (two) times daily. Use in each nostril as directed  . cholecalciferol (VITAMIN D) 1000 units tablet Take 1 tablet (1,000 Units total) by mouth daily.  . clopidogrel (PLAVIX) 75 MG tablet TAKE 1 TABLET BY MOUTH ONCE DAILY  . furosemide (LASIX) 40 MG tablet  TAKE 1 TABLET BY MOUTH ONCE DAILY  . lisinopril (PRINIVIL,ZESTRIL) 20 MG tablet TAKE 1 TABLET BY MOUTH ONCE DAILY  . metoprolol succinate (TOPROL-XL) 50 MG 24 hr tablet TAKE 1 TABLET BY MOUTH ONCE DAILY WITH OR IMMEDIATELY FOLLOWING A MEAL  . montelukast (SINGULAIR) 10 MG tablet TAKE 1 TABLET BY MOUTH AT BEDTIME  . nitroGLYCERIN (NITROSTAT) 0.4 MG SL tablet DISSOLVE ONE TABLET UNDER THE TONGUE EVERY 5 MINUTES AS NEEDED FOR CHEST PAIN.  DO NOT EXCEED A TOTAL OF 3 DOSES IN 15 MINUTES (Patient taking differently: Place 0.4 mg under the tongue every 5 (five) minutes as needed for chest pain. )     No Known Allergies  Social History   Socioeconomic History  . Marital status: Single    Spouse name: Not on file  . Number of children: 2  . Years of education: Not on file  . Highest education level: 11th grade  Occupational History  . Not on file  Social Needs  . Financial resource strain: Somewhat hard  . Food insecurity:    Worry: Sometimes true    Inability: Sometimes true  . Transportation needs:    Medical: Yes    Non-medical: Yes  Tobacco Use  . Smoking status: Former Smoker    Packs/day: 0.50    Years: 3.00    Pack years: 1.50    Types: Cigarettes    Start date: 10/12/1965    Last attempt to quit: 02/19/1970    Years since quitting: 48.8  .  Smokeless tobacco: Never Used  Substance and Sexual Activity  . Alcohol use: No    Alcohol/week: 10.0 standard drinks    Types: 10 Cans of beer per week  . Drug use: No  . Sexual activity: Not Currently  Lifestyle  . Physical activity:    Days per week: 3 days    Minutes per session: 20 min  . Stress: Only a little  Relationships  . Social connections:    Talks on phone: Once a week    Gets together: Three times a week    Attends religious service: More than 4 times per year    Active member of club or organization: No    Attends meetings of clubs or organizations: Never    Relationship status: Divorced  . Intimate partner  violence:    Fear of current or ex partner: No    Emotionally abused: No    Physically abused: No    Forced sexual activity: No  Other Topics Concern  . Not on file  Social History Narrative  . Not on file     Review of Systems: General: negative for chills, fever, night sweats or weight changes.  Cardiovascular: negative for chest pain, dyspnea on exertion, edema, orthopnea, palpitations, paroxysmal nocturnal dyspnea or shortness of breath Dermatological: negative for rash Respiratory: negative for cough or wheezing Urologic: negative for hematuria Abdominal: negative for nausea, vomiting, diarrhea, bright red blood per rectum, melena, or hematemesis Neurologic: negative for visual changes, syncope, or dizziness All other systems reviewed and are otherwise negative except as noted above.    Blood pressure (!) 127/51, pulse (!) 52, height 5\' 4"  (1.626 m), weight 123 lb 12.8 oz (56.2 kg).  General appearance: alert and no distress Neck: no adenopathy, no JVD, supple, symmetrical, trachea midline, thyroid not enlarged, symmetric, no tenderness/mass/nodules and Soft bilateral carotid bruits Lungs: clear to auscultation bilaterally Heart: 3/6 diastolic blow at the left sternal border consistent with aortic insufficiency. Extremities: extremities normal, atraumatic, no cyanosis or edema Pulses: Diminished pedal pulses bilaterally Skin: Skin color, texture, turgor normal. No rashes or lesions Neurologic: Alert and oriented X 3, normal strength and tone. Normal symmetric reflexes. Normal coordination and gait  EKG sinus bradycardia at 52 with evidence of LVH with repolarization changes and left axis deviation.  I personally reviewed this EKG.  ASSESSMENT AND PLAN:   Peripheral arterial disease ([place]) History of peripheral arterial disease status post lower extremity angiography and intervention by myself 10/31/2018 with demonstration of high-grade SFA disease bilaterally.  He underwent  directional atherectomy and drug-coated balloon angioplasty of his left SFA.  His follow-up Dopplers performed 11/22/2018 showed marked improvement on the left side with a left ABI of 0.98.  His claudication has resolved.  He remains on aspirin Plavix.  He has no symptoms on the right despite his disease.  We will get Dopplers on him every 6 months.  S/P aortic valve replacement History of prior bioprosthetic AVR with semiurgent TAVR at [facility] January 2019 for acute decompensated heart failure with severe AI and LV dysfunction.  He does have a loud AI murmur on exam today.  His last echo was approximately 2 months ago.  I am going to repeat a 2D echocardiogram.      Lorretta Harp MD [facility], [facility] 12/06/2018 11:15 AM

## 2018-12-06 NOTE — Patient Instructions (Signed)
Medication Instructions:  Your physician recommends that you continue on your current medications as directed. Please refer to the Current Medication list given to you today.  If you need a refill on your cardiac medications before your next appointment, please call your pharmacy.   Lab work: NONE If you have labs (blood work) drawn today and your tests are completely normal, you will receive your results only by: [name] MyChart Message (if you have MyChart) OR . A paper copy in the mail If you have any lab test that is abnormal or we need to change your treatment, we will call you to review the results.  Testing/Procedures: Your physician has requested that you have an echocardiogram. Echocardiography is a painless test that uses sound waves to create images of your heart. It provides your doctor with information about the size and shape of your heart and how well your heart's chambers and valves are working. This procedure takes approximately one hour. There are no restrictions for this procedure. SCHEDULE NOW  Your physician has requested that you have a lower or upper extremity arterial duplex. This test is an ultrasound of the arteries in the legs or arms. It looks at arterial blood flow in the legs and arms. Allow one hour for Lower and Upper Arterial scans. There are no restrictions or special instructions SCHEDULE IN 6 MONTHS AND IN 12 MONTHS (NEEDS EVERY 6 MONTHS)  Your physician has requested that you have an ankle brachial index (ABI). During this test an ultrasound and blood pressure cuff are used to evaluate the arteries that supply the arms and legs with blood. Allow thirty minutes for this exam. There are no restrictions or special instructions. SCHEDULE IN 6 MONTHS AND IN 12 MONTHS (NEEDS EVERY 6 MONTHS)  Follow-Up: At [facility], you and your health needs are our priority.  As part of our continuing mission to provide you with exceptional heart care, we have created designated  Provider Care Teams.  These Care Teams include your primary Cardiologist (physician) and Advanced Practice Providers (APPs -  Physician Assistants and Nurse Practitioners) who all work together to provide you with the care you need, when you need it. . You will need a follow up appointment in 12 months.  Please call our office 2 months in advance to schedule this appointment.  You may see Dr. Gwenlyn Found or one of the following Advanced Practice Providers on your designated Care Team:   . Kerin Ransom, [place] . Almyra Deforest, PA-C . Fabian Sharp, PA-C . Jory Sims, DNP . Rosaria Ferries, PA-C . Roby Lofts, PA-C . Sande Rives, PA-C

## 2018-12-06 NOTE — Assessment & Plan Note (Signed)
History of prior bioprosthetic AVR with semiurgent TAVR at [facility] January 2019 for acute decompensated heart failure with severe AI and LV dysfunction.  He does have a loud AI murmur on exam today.  His last echo was approximately 2 months ago.  I am going to repeat a 2D echocardiogram.

## 2018-12-27 ENCOUNTER — Telehealth: Payer: Self-pay | Admitting: Cardiology

## 2018-12-27 NOTE — Telephone Encounter (Signed)
Based on chart review I would keep this patient's echo on the schedule for 4/9/202. Please let him know, thank you. Ena Dawley

## 2018-12-28 NOTE — Telephone Encounter (Signed)
Dr. Meda Coffee wanted [place] to keep [name] echo on the schedule in April as already scheduled place

## 2018-12-29 ENCOUNTER — Ambulatory Visit (HOSPITAL_COMMUNITY): Payer: Medicare Other | Attending: Cardiovascular Disease

## 2018-12-29 ENCOUNTER — Other Ambulatory Visit: Payer: Self-pay

## 2018-12-29 DIAGNOSIS — R011 Cardiac murmur, unspecified: Secondary | ICD-10-CM | POA: Insufficient documentation

## 2019-01-17 ENCOUNTER — Other Ambulatory Visit: Payer: Self-pay

## 2019-01-17 ENCOUNTER — Ambulatory Visit (INDEPENDENT_AMBULATORY_CARE_PROVIDER_SITE_OTHER): Payer: Medicare Other | Admitting: Family Medicine

## 2019-01-17 ENCOUNTER — Encounter: Payer: Self-pay | Admitting: Family Medicine

## 2019-01-17 DIAGNOSIS — E785 Hyperlipidemia, unspecified: Secondary | ICD-10-CM

## 2019-01-17 DIAGNOSIS — I10 Essential (primary) hypertension: Secondary | ICD-10-CM | POA: Diagnosis not present

## 2019-01-17 DIAGNOSIS — J301 Allergic rhinitis due to pollen: Secondary | ICD-10-CM | POA: Diagnosis not present

## 2019-01-17 NOTE — Patient Instructions (Signed)
    Thank you for completing your visit today via telemedicine. I appreciate the opportunity to provide you with the care for your health and wellness. Today we discussed: Overall health.  Glad you are doing well today.  We hope that you continue to do well in the future. Continue to take your medications as directed. Dr. Moshe Cipro will see you back in 5 months.    Continue to work on diet and exercise as you can.  [place] YOUR HANDS WELL AND FREQUENTLY. AVOID TOUCHING YOUR FACE, UNLESS YOUR HANDS ARE FRESHLY WASHED.  GET FRESH AIR DAILY. STAY HYDRATED WITH WATER.   It was a pleasure to see you and I look forward to continuing to work together on your health and well-being. Please do not hesitate to call the office if you need care or have questions about your care.  Have a wonderful day and week. With Gratitude, Cherly Beach, DNP, AGNP-BC

## 2019-01-17 NOTE — Progress Notes (Signed)
Virtual Visit via Telephone Note  I connected with [name] on 01/17/19 at  8:40 AM EDT by telephone and verified that I am speaking with the correct person using two identifiers.   I discussed the limitations, risks, security and privacy concerns of performing an evaluation and management service by telephone and the availability of in person appointments. I also discussed with the patient that there may be a patient responsible charge related to this service. The patient expressed understanding and agreed to proceed.  Location of Patient: Home Location of Provider: Telehealth Consent was obtain for visit to be over via telehealth.  History of Present Illness: [name] is a 68 year old male patient of Dr. Griffin Dakin.  Who presents for his 4.58-month follow-up.  Reports taking medications as directed.  Does not have any complaints or concerns today on telephone visit.  Denies having any signs or symptoms of shortness of breath cough, chest pain, palpitations or leg swelling.  Denies fevers chills or any signs or symptoms of congestion and/or infection.  He has a history of hypertension, arterial disease, CAD, hyperlipidemia, allergies.  Overall denies any headache, vision changes, chest pain.  Or signs and symptoms of elevated blood pressure.  Denies having any claudication or difficulties with ambulating or clots at this time.  Reports eating diet as usual.  Has not tried to adjust anything at this time.  Does report having some environmental allergies but reports that he is doing okay with them is using Astelin spray.  Past Medical, Surgical, Social History, Allergies, and Medications have been Reviewed.  Review of Systems  Constitutional: Negative for activity change, appetite change, chills and fever.  HENT: Negative.   Eyes: Negative.   Respiratory: Negative.  Negative for cough and shortness of breath.   Cardiovascular: Negative.  Negative for chest pain, palpitations and leg  swelling.  Gastrointestinal: Negative.   Endocrine: Negative.   Genitourinary: Negative.   Musculoskeletal: Negative.   Skin: Negative.   Allergic/Immunologic: Positive for environmental allergies.  Neurological: Negative.  Negative for dizziness and headaches.  Hematological: Negative.   Psychiatric/Behavioral: Negative.   All other systems reviewed and are negative.  Observations/Objective: Physical Exam Constitutional:      General: He is awake.  Pulmonary:     Effort: Pulmonary effort is normal.  Neurological:     Mental Status: He is alert and oriented to person, place, and time.  Psychiatric:        Attention and Perception: Attention and perception normal.        Mood and Affect: Mood and affect normal.        Speech: Speech normal.        Behavior: Behavior normal. Behavior is cooperative.        Thought Content: Thought content normal.        Cognition and Memory: Cognition and memory normal.        Judgment: Judgment normal.   Good communication via the telephone visit. There was no noted shortness of breath during communication, respiratory effort  normal.  Assessment and Plan: 1. Essential hypertension, benign Unsure if this is still controlled as we can take the blood pressure today due to the telemedicine visit.  Previous office notes report that he has had some control in the past.  Reports that he is taking his medication as directed.  Denies having any signs or symptoms of elevated BP today on the telephone visit.  Advised to continue with medication as directed.  2. Hyperlipidemia LDL goal <  70 Controlled, encouraged to maintain heart healthy, low-fat low-cholesterol diet.  Continue on Lipitor.  3. Allergic rhinitis due to pollen, unspecified seasonality Continue use of Astelin, reports these are controlled for the most part.   Follow Up Instructions: AVS printed and mailed   I discussed the assessment and treatment plan with the patient. The patient was  provided an opportunity to ask questions and all were answered. The patient agreed with the plan and demonstrated an understanding of the instructions.   The patient was advised to call back or seek an in-person evaluation if the symptoms worsen or if the condition fails to improve as anticipated.  I provided 10 minutes of non-face-to-face time during this encounter.   Perlie Mayo, NP

## 2019-01-23 ENCOUNTER — Telehealth: Payer: Self-pay | Admitting: Cardiology

## 2019-01-23 NOTE — Telephone Encounter (Signed)
.   Virtual Visit Pre-Appointment Phone Call  "(Name), I am calling you today to discuss your upcoming appointment. We are currently trying to limit exposure to the virus that causes COVID-19 by seeing patients at home rather than in the office."  1. "What is the BEST phone number to call the day of the visit?" - include this in appointment notes  2. Do you have or have access to (through a family member/friend) a smartphone with video capability that we can use for your visit?" a. If yes - list this number in appt notes as cell (if different from BEST phone #) and list the appointment type as a VIDEO visit in appointment notes b. If no - list the appointment type as a PHONE visit in appointment notes  3. Confirm consent - "In the setting of the current Covid19 crisis, you are scheduled for a (phone or video) visit with your provider on (date) at (time).  Just as we do with many in-office visits, in order for you to participate in this visit, we must obtain consent.  If you'd like, I can send this to your mychart (if signed up) or email for you to review.  Otherwise, I can obtain your verbal consent now.  All virtual visits are billed to your insurance company just like a normal visit would be.  By agreeing to a virtual visit, we'd like you to understand that the technology does not allow for your provider to perform an examination, and thus may limit your provider's ability to fully assess your condition. If your provider identifies any concerns that need to be evaluated in person, we will make arrangements to do so.  Finally, though the technology is pretty good, we cannot assure that it will always work on either your or our end, and in the setting of a video visit, we may have to convert it to a phone-only visit.  In either situation, we cannot ensure that we have a secure connection.  Are you willing to proceed?" STAFF: Did the patient verbally acknowledge consent to telehealth visit? Document  YES/NO here: Yes  4. Advise patient to be prepared - "Two hours prior to your appointment, go ahead and check your blood pressure, pulse, oxygen saturation, and your weight (if you have the equipment to check those) and write them all down. When your visit starts, your provider will ask you for this information. If you have an Apple Watch or Kardia device, please plan to have heart rate information ready on the day of your appointment. Please have a pen and paper handy nearby the day of the visit as well."  5. Give patient instructions for MyChart download to smartphone OR [email or web address] as below if video visit (depending on what platform provider is using)  6. Inform patient they will receive a phone call 15 minutes prior to their appointment time (may be from unknown caller ID) so they should be prepared to answer    [name] has been deemed a candidate for a follow-up tele-health visit to limit community exposure during the Covid-19 pandemic. I spoke with the patient via phone to ensure availability of phone/video source, confirm preferred email & phone number, and discuss instructions and expectations.  I reminded [name] to be prepared with any vital sign and/or heart rhythm information that could potentially be obtained via home monitoring, at the time of his visit. I reminded [name] to expect a phone call prior  to his visit.  [name] 01/23/2019 8:56 AM

## 2019-01-27 NOTE — Progress Notes (Signed)
Virtual Visit via Telephone Note   This visit type was conducted due to national recommendations for restrictions regarding the COVID-19 Pandemic (e.g. social distancing) in an effort to limit this patient's exposure and mitigate transmission in our community.  Due to his co-morbid illnesses, this patient is at least at moderate risk for complications without adequate follow up.  This format is felt to be most appropriate for this patient at this time.  The patient did not have access to video technology/had technical difficulties with video requiring transitioning to audio format only (telephone).  All issues noted in this document were discussed and addressed.  No physical exam could be performed with this format.  Please refer to the patient's chart for his  consent to telehealth for [facility].   Date:  01/30/2019   ID:  [name], DOB 08/02/1951, MRN [id number]  Patient Location: Home Provider Location: Home  PCP:  Bruce Helper, MD  Cardiologist:  Bruce Lesches, MD  Evaluation Performed:  Follow-Up Visit  Chief Complaint:  Cardiac follow-up  History of Present Illness:    [name] is a 68 y.o. male last seen in October 2019.  He did not have video access today and we spoke by phone.  He tells me that he has done relatively well, reporting NYHA class II dyspnea, no exertional chest pain or palpitations.  He has had improvement in claudication and doing ADLs including recent yard work, plans to plant a garden.  He has had interval follow-up with Dr. Gwenlyn Found for management of PAD.  He was diagnosed with high-grade SFA disease and underwent intervention on the left SFA with follow-up arterial Dopplers in March showing left ABI of 0.98 with resolution of claudication.  Dr. Gwenlyn Found ordered a follow-up echocardiogram that was done in April as outlined below.  As noted previously patient has a complex history of valvular heart disease with previous TAVR in 2005 and more  recently valve in valve TAVR in January 2019 at [facility].  I reviewed the results and we will continue with observation for now in light of symptom stability.  He does not report any fevers or chills.  The patient does not have symptoms concerning for COVID-19 infection (fever, chills, cough, or new shortness of breath).  He states that he has been staying around the house, has to arrange for transportation when he goes out.  He shops twice a month and stocks up.  Past Medical History:  Diagnosis Date  . Arthritis   . CAD (coronary artery disease)    a. s/p PCI with 3.5 x 18 BMS to mid RCA, non obst LAD, Lcx patent 08/10/13  . Depression   . Embolus of femoral artery [facility]) 2012   Surgical repair, previously on Coumadin  . Essential hypertension, benign   . ETOH abuse   . Headache(784.0)   . History of DVT (deep vein thrombosis) 2011  . History of GI bleed    February 2012   . 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 Prosthetic valve endocarditis ([place])   . PVD (peripheral vascular disease) ([place])   . S/P aortic valve replacement    Details not clear -  reportedly 1971 per patient   Past Surgical History:  Procedure Laterality Date  . ABDOMINAL AORTOGRAM W/LOWER EXTREMITY N/A 10/31/2018   Procedure: ABDOMINAL AORTOGRAM W/LOWER EXTREMITY;  Surgeon: Lorretta Harp, MD;  Location: [place] CV LAB;  Service: Cardiovascular;  Laterality: N/A;  . Aoric valve replacement  January 2005   24mm Medtronic Freestyle stentless porcine bioprosthesis [facility])  . CARDIAC CATHETERIZATION     30-40% mid LAD, 20-30% ostial D1, proximal 30-40% RCA, 95% mid RCA thrombus, 40% distal stenosis. EF 45%, inferior HK.  [name] COLONOSCOPY N/A 04/20/2014   Procedure: COLONOSCOPY;  Surgeon: Rogene Houston, MD;  [facility];  Service: Endoscopy;  Laterality: N/A;  930  . CORONARY ANGIOPLASTY  WITH STENT PLACEMENT  07/2013  . LEFT HEART CATHETERIZATION WITH CORONARY ANGIOGRAM N/A 08/10/2013   Procedure: LEFT HEART CATHETERIZATION WITH CORONARY ANGIOGRAM;  Surgeon: Blane Ohara, MD;  Location: [facility] CATH LAB;  Service: Cardiovascular;  Laterality: N/A;  . Left leg embolectomy  May 2012   Dr. Trula Slade - reason for lifelong coumadin  . PERIPHERAL VASCULAR ATHERECTOMY  10/31/2018   Procedure: PERIPHERAL VASCULAR ATHERECTOMY;  Surgeon: Lorretta Harp, MD;  Location: [place] CV LAB;  Service: Cardiovascular;;  left SFA     Current Meds  Medication Sig  . acetaminophen (TYLENOL) 325 MG tablet Take 650 mg by mouth every morning.  [name] aspirin EC 81 MG tablet Take 1 tablet (81 mg total) by mouth daily.  [name] atorvastatin (LIPITOR) 40 MG tablet TAKE 1 TABLET BY MOUTH ONCE DAILY  . azelastine (ASTELIN) 0.1 % nasal spray Place 2 sprays into both nostrils 2 (two) times daily. Use in each nostril as directed  . cholecalciferol (VITAMIN D) 1000 units tablet Take 1 tablet (1,000 Units total) by mouth daily.  . clopidogrel (PLAVIX) 75 MG tablet TAKE 1 TABLET BY MOUTH ONCE DAILY  . furosemide (LASIX) 40 MG tablet TAKE 1 TABLET BY MOUTH ONCE DAILY  . lisinopril (PRINIVIL,ZESTRIL) 20 MG tablet TAKE 1 TABLET BY MOUTH ONCE DAILY  . metoprolol succinate (TOPROL-XL) 50 MG 24 hr tablet TAKE 1 TABLET BY MOUTH ONCE DAILY WITH OR IMMEDIATELY FOLLOWING A MEAL  . montelukast (SINGULAIR) 10 MG tablet TAKE 1 TABLET BY MOUTH AT BEDTIME  . nitroGLYCERIN (NITROSTAT) 0.4 MG SL tablet DISSOLVE ONE TABLET UNDER THE TONGUE EVERY 5 MINUTES AS NEEDED FOR CHEST PAIN.  DO NOT EXCEED A TOTAL OF 3 DOSES IN 15 MINUTES (Patient taking differently: Place 0.4 mg under the tongue every 5 (five) minutes as needed for chest pain. )     Allergies:   Patient has no known allergies.   Social History   Tobacco Use  . Smoking status: Former Smoker    Packs/day: 0.50    Years: 3.00    Pack years: 1.50    Types: Cigarettes     Start date: 10/12/1965    Last attempt to quit: 02/19/1970    Years since quitting: 48.9  . Smokeless tobacco: Never Used  Substance Use Topics  . Alcohol use: No    Alcohol/week: 10.0 standard drinks    Types: 10 Cans of beer per week  . Drug use: No     Family Hx: The patient's family history includes COPD in his sister; Cirrhosis in his father; Diabetes in his mother.  ROS:   Please see the history of present illness.    Intermittent back and leg pain. All other systems reviewed and are negative.   Prior CV studies:   The following studies were reviewed today:  Echocardiogram 12/29/2018:  1. The left ventricle has mildly reduced systolic function, with an ejection fraction of 45-50%. The cavity size  was moderately dilated. There is mildly increased left ventricular wall thickness. Left ventricular diastolic Doppler parameters are  consistent with impaired relaxation.  2. The right ventricle has normal systolic function. The cavity was normal. There is no increase in right ventricular wall thickness.  3. Left atrial size was moderately dilated.  4. The tricuspid valve is grossly normal.  5. Post bioprosthetic AVR with valve in valve TAVR using 26 mm Sapien 3 Small perivalvular leak on septal side Valve appears to have been placed deep in ventrical Also some ? central AR. The peak gradient has increased from 15 mmHg to 21 mmHg since  12/2017 Also some unusual color flow in subcostal views in LVOT and 3 chamber view shows some diastolic flow out by posterior wall so may be underestimating degree of AR Can consider TEE if clinicaly indicated after COVID 19 restrictions lifted.  6. The interatrial septum was not well visualized.  Lower extremity arterial Dopplers 11/22/2018: Summary: Right: Resting right ankle-brachial index indicates moderate right lower extremity arterial disease. The right toe-brachial index is abnormal.   Left: Resting left ankle-brachial index is within normal  range. No evidence of significant left lower extremity arterial disease. The left toe-brachial index is abnormal.  Labs/Other Tests and Data Reviewed:    EKG:  An ECG dated 12/06/2018 was personally reviewed today and demonstrated:  Sinus bradycardia with prolonged PR interval, left anterior fascicular block, nonspecific ST-T wave abnormalities laterally.  Recent Labs: 03/08/2018: TSH 0.70 09/01/2018: ALT 18 11/01/2018: BUN 20; Creatinine, Ser 1.23; Hemoglobin 10.5; Platelets 149; Potassium 4.3; Sodium 138   Recent Lipid Panel Lab Results  Component Value Date/Time   CHOL 115 09/01/2018 09:33 AM   TRIG 66 09/01/2018 09:33 AM   HDL 30 (L) 09/01/2018 09:33 AM   CHOLHDL 3.8 09/01/2018 09:33 AM   LDLCALC 70 09/01/2018 09:33 AM    Wt Readings from Last 3 Encounters:  01/30/19 120 lb (54.4 kg)  12/06/18 123 lb 12.8 oz (56.2 kg)  10/31/18 125 lb (56.7 kg)     Objective:    Vital Signs:  Ht 5\' 4"  (1.626 m)   Wt 120 lb (54.4 kg)   BMI 20.60 kg/m    He did not have a way of checking his vital signs today.  Weight is relatively stable. He spoke in full sentences on the phone, not short of breath.  No audible wheezing.  Voice tone and speech pattern are normal.  ASSESSMENT & PLAN:    1.  Complex aortic valve disease status post bioprosthetic AVR and ultimately subsequent valve in valve TAVR due to severe aortic regurgitation due to torn prosthetic coronary cusp.  He is symptomatically stable at this time and continues on dual antiplatelet regimen.  I did review his recent echocardiogram as outlined above and we will continue with observation for now.  Frankly, re-intervention on the aortic valve is most likely not an option.  2.  Secondary cardiomyopathy with LVEF in the range of 45 to 50%, improved by follow-up echocardiogram in April.  Toprol-XL and lisinopril.  He also has Lasix. Weight has been stable.  3.  CAD status post BMS to the RCA in 2014.  No active angina symptoms.  Continue  antiplatelet regimen and statin.  4.  Mixed hyperlipidemia, on Lipitor with follow-up by Dr. Moshe Cipro.  Last LDL 70.  COVID-19 Education: The signs and symptoms of COVID-19 were discussed with the patient and how to seek care for testing (follow up with PCP or arrange E-visit).  The importance of social distancing was discussed today.  Time:   Today, I have spent 7 minutes with the patient with telehealth technology discussing the above problems.     Medication Adjustments/Labs and Tests Ordered: Current medicines are reviewed at length with the patient today.  Concerns regarding medicines are outlined above.   Tests Ordered: Orders Placed This Encounter  Procedures  . ECHOCARDIOGRAM COMPLETE    Medication Changes: No orders of the defined types were placed in this encounter.   Disposition:  Follow up 6 month office visit in [place].  Signed, Bruce Lesches, MD  01/30/2019 8:56 AM    [place]

## 2019-01-30 ENCOUNTER — Encounter: Payer: Self-pay | Admitting: Cardiology

## 2019-01-30 ENCOUNTER — Telehealth (INDEPENDENT_AMBULATORY_CARE_PROVIDER_SITE_OTHER): Payer: Medicare Other | Admitting: Cardiology

## 2019-01-30 VITALS — Ht 64.0 in | Wt 120.0 lb

## 2019-01-30 DIAGNOSIS — Z7189 Other specified counseling: Secondary | ICD-10-CM

## 2019-01-30 DIAGNOSIS — E785 Hyperlipidemia, unspecified: Secondary | ICD-10-CM | POA: Diagnosis not present

## 2019-01-30 DIAGNOSIS — I739 Peripheral vascular disease, unspecified: Secondary | ICD-10-CM

## 2019-01-30 DIAGNOSIS — I25119 Atherosclerotic heart disease of native coronary artery with unspecified angina pectoris: Secondary | ICD-10-CM

## 2019-01-30 DIAGNOSIS — I429 Cardiomyopathy, unspecified: Secondary | ICD-10-CM

## 2019-01-30 DIAGNOSIS — Z952 Presence of prosthetic heart valve: Secondary | ICD-10-CM

## 2019-01-30 NOTE — Patient Instructions (Signed)
Medication Instructions: .insycur   Labwork: None today  Procedures/Testing: IN 6 MONTHS before next visit  Your physician has requested that you have an echocardiogram. Echocardiography is a painless test that uses sound waves to create images of your heart. It provides your doctor with information about the size and shape of your heart and how well your heart's chambers and valves are working. This procedure takes approximately one hour. There are no restrictions for this procedure.    Follow-Up: 6 months with Dr.McDowell  Any Additional Special Instructions Will Be Listed Below (If Applicable).     If you need a refill on your cardiac medications before your next appointment, please call your pharmacy.

## 2019-02-10 ENCOUNTER — Other Ambulatory Visit: Payer: Self-pay | Admitting: Family Medicine

## 2019-02-14 ENCOUNTER — Other Ambulatory Visit: Payer: Self-pay

## 2019-02-27 ENCOUNTER — Other Ambulatory Visit: Payer: Self-pay

## 2019-02-27 ENCOUNTER — Encounter: Payer: Self-pay | Admitting: Family Medicine

## 2019-02-27 ENCOUNTER — Ambulatory Visit (INDEPENDENT_AMBULATORY_CARE_PROVIDER_SITE_OTHER): Payer: Medicare Other | Admitting: Family Medicine

## 2019-02-27 DIAGNOSIS — K469 Unspecified abdominal hernia without obstruction or gangrene: Secondary | ICD-10-CM

## 2019-02-27 HISTORY — DX: Unspecified abdominal hernia without obstruction or gangrene: K46.9

## 2019-02-27 NOTE — Progress Notes (Signed)
Pt c/o 1 year h/o hernia below navel , getting larger , no pain, embarrassing and interferes with sex life. Has had 3 hernia repairs in the past, needs evaluation prior to surgery referral, he will  Need office veal, will  schedule one next week,and notify

## 2019-02-27 NOTE — Assessment & Plan Note (Signed)
Needs physical eval prior to referral, will schedule

## 2019-03-06 ENCOUNTER — Ambulatory Visit: Payer: Medicare Other | Admitting: Family Medicine

## 2019-03-08 ENCOUNTER — Encounter: Payer: Self-pay | Admitting: Family Medicine

## 2019-03-08 ENCOUNTER — Ambulatory Visit (INDEPENDENT_AMBULATORY_CARE_PROVIDER_SITE_OTHER): Payer: Medicare Other | Admitting: Family Medicine

## 2019-03-08 ENCOUNTER — Other Ambulatory Visit: Payer: Self-pay

## 2019-03-08 VITALS — BP 130/70 | HR 55 | Temp 97.5°F | Resp 15 | Ht 64.0 in | Wt 118.0 lb

## 2019-03-08 DIAGNOSIS — K4021 Bilateral inguinal hernia, without obstruction or gangrene, recurrent: Secondary | ICD-10-CM | POA: Diagnosis not present

## 2019-03-08 DIAGNOSIS — I779 Disorder of arteries and arterioles, unspecified: Secondary | ICD-10-CM

## 2019-03-08 DIAGNOSIS — N528 Other male erectile dysfunction: Secondary | ICD-10-CM | POA: Diagnosis not present

## 2019-03-08 DIAGNOSIS — J3089 Other allergic rhinitis: Secondary | ICD-10-CM

## 2019-03-08 NOTE — Patient Instructions (Signed)
F/u as before, call if you need me sooner  You are being referred to surgeon in [place] re hernias  You are being referred to Urologist re erectile problems  Social distancing. Frequent hand washing with soap and water Keeping your hands off of your face. These 3 practices will help to keep both you and your community healthy during this time. Please practice them faithfully!  Thanks for choosing [facility], we consider it a privelige to serve you.

## 2019-03-13 ENCOUNTER — Encounter: Payer: Self-pay | Admitting: Family Medicine

## 2019-03-13 MED ORDER — FLUTICASONE PROPIONATE 50 MCG/ACT NA SUSP: 2.0000 | Freq: Every day | NASAL | 6 refills | Status: DC

## 2019-03-13 NOTE — Assessment & Plan Note (Signed)
Reports progressively enlarging inguinal hernias x 1 year. Refer to surgery for repair, as non obstructed bilateral inguinal hernias present on exam

## 2019-03-13 NOTE — Progress Notes (Signed)
   [name]     MRN: [id number]      DOB: 1950/12/11   HPI [name] is here for evaluation of recurrent inguinal hernias increasingly enlarging in the past 1 year and interfering with sexual activity. States he has both repaired in the past C/o ED , unable to attain or maintain an erection, has heart disease Requests more affordable medication thna astelin  ROS Denies recent fever or chills. Denies sinus pressure, nasal congestion, ear pain or sore throat. Denies chest congestion, productive cough or wheezing. Denies chest pains, palpitations and leg swelling    Denies dysuria, frequency, hesitancy or incontinence. Denies joint pain, swelling and limitation in mobility. Denies headaches, seizures, numbness, or tingling. Denies depression, anxiety or insomnia. Denies skin break down or rash.   PE  BP 130/70   Pulse (!) 55   Temp (!) 97.5 F (36.4 C) (Temporal)   Resp 15   Ht 5\' 4"  (1.626 m)   Wt 118 lb (53.5 kg)   SpO2 96%   BMI 20.25 kg/m   Patient alert and oriented and in no cardiopulmonary distress.  HEENT: No facial asymmetry, EOMI,   oropharynx pink and moist.  Neck supple no JVD, no mass.  Chest: Clear to auscultation bilaterally.  CVS: S1, S2 no murmurs, no S3.Regular rate.  ABD: Soft non tender. Bilateral reducible large inguinal hernias  Ext: No edema  MS: Adequate ROM spine, shoulders, hips and knees.  Skin: Intact, no ulcerations or rash noted.  Psych: Good eye contact, normal affect. Memory intact not anxious or depressed appearing.  CNS: CN 2-12 intact, power,  normal throughout.no focal deficits noted.   Assessment & Plan Bilateral recurrent inguinal hernias Reports progressively enlarging inguinal hernias x 1 year. Refer to surgery for repair, as non obstructed bilateral inguinal hernias present on exam  ED (erectile dysfunction) Difficulty attaining and maintaining an erection, refer to Urology has cAD and valvular heart disease  also  Allergic rhinitis Uncontrolled, need new n, affordable nasal spray

## 2019-03-13 NOTE — Assessment & Plan Note (Signed)
Difficulty attaining and maintaining an erection, refer to Urology has cAD and valvular heart disease also

## 2019-03-13 NOTE — Assessment & Plan Note (Signed)
Uncontrolled, need new n, affordable nasal spray

## 2019-03-15 ENCOUNTER — Telehealth: Payer: Self-pay | Admitting: *Deleted

## 2019-03-15 NOTE — Telephone Encounter (Signed)
This needs to be forwarded to referral staff to follow through please,the referrals have already  Been sent, thank you

## 2019-03-15 NOTE — Telephone Encounter (Signed)
[facility] Pt has appt 03-28-19 at 11:30 with Ninfa Linden.

## 2019-03-15 NOTE — Telephone Encounter (Signed)
Pt called wanted to let Dr. Moshe Cipro know that the surgery for his erectile dysfunction was set up for 04-18-19 but he had yet to hear back from any of the other dr about other surgeries. Pt can be reached at [phone]

## 2019-03-15 NOTE — Telephone Encounter (Signed)
He hasn't heard anything back from General surgery referral. Could you find out where they are at with the referral and let the patient know please?

## 2019-03-28 ENCOUNTER — Other Ambulatory Visit: Payer: Self-pay | Admitting: Surgery

## 2019-03-28 DIAGNOSIS — K4020 Bilateral inguinal hernia, without obstruction or gangrene, not specified as recurrent: Secondary | ICD-10-CM | POA: Diagnosis not present

## 2019-03-29 ENCOUNTER — Telehealth: Payer: Self-pay

## 2019-03-29 NOTE — Telephone Encounter (Signed)
I called [name], he has no new issue since the last virtual visit. He walks about 1.5 miles to the local post office and back without any exertional chest pain or shortness of breath. Since he is able to complete at least 4 METS of activity, no further workup is planned at this time.   He is on both aspirin and plavix. I will check with Dr. Domenic Polite to see if he is ok with him holding plavix for 5-7 days prior to the procedure and restart it after the surgery.

## 2019-03-29 NOTE — Telephone Encounter (Signed)
   [facility] Pre-operative Risk Assessment    Request for surgical clearance:  1. What type of surgery is being performed? BILATERAL INGUINAL HERNIA REPAIR    2. When is this surgery scheduled? TBD   3. What type of clearance is required (medical clearance vs. Pharmacy clearance to hold med vs. Both)? BOTH  4. Are there any medications that need to be held prior to surgery and how long? PT TAKES PLAVIX   5. Practice name and name of physician performing surgery? ATTN: [facility] Coralie Keens, M.D.   6. What is your office phone number [phone]    7.   What is your office fax number [phone]  8.   Anesthesia type (None, local, MAC, general) ?  GENERAL ANESTHESIA   Waylan Rocher 03/29/2019, 11:17 AM  _________________________________________________________________   (provider comments below)

## 2019-03-30 ENCOUNTER — Telehealth: Payer: Self-pay | Admitting: *Deleted

## 2019-03-30 NOTE — Telephone Encounter (Signed)
Yes, reasonable to hold Plavix 5 days prior to the procedure.

## 2019-03-30 NOTE — Telephone Encounter (Signed)
Pt called needing to speak to someone about his blood thinners would like a call back

## 2019-03-30 NOTE — Telephone Encounter (Signed)
   Primary Cardiologist: Rozann Lesches, MD  Chart reviewed as part of pre-operative protocol coverage. Patient was contacted 03/30/2019 in reference to pre-operative risk assessment for pending surgery as outlined below.  [name] was last seen on 01/30/2019 by Dr. Domenic Polite.  Since that day, [name] has done well without any exertional chest pain or shortness of breath.  Therefore, based on ACC/AHA guidelines, the patient would be at acceptable risk for the planned procedure without further cardiovascular testing.   I will route this recommendation to the requesting party via Epic fax function and remove from pre-op pool.  Please call with questions. Per Dr. Domenic Polite, ok to hold plavix for 5 days prior to the procedure  Almyra Deforest, PA 03/30/2019, 11:39 AM

## 2019-03-30 NOTE — Telephone Encounter (Signed)
Patient was told to stop taking his Plavix 5 days before a procedure and he wanted to make sure he was stopping the right medication. He spelled it and I told him he was stopping the right thing.

## 2019-03-30 NOTE — Telephone Encounter (Signed)
Called [name] and informed him to hold his Plavix 5 days before the procedure/surgery and to restart as soon as possible after. Patient verbalized an understanding and all if any questions were answered.

## 2019-03-31 ENCOUNTER — Other Ambulatory Visit: Payer: Self-pay | Admitting: Family Medicine

## 2019-04-12 NOTE — Pre-Procedure Instructions (Addendum)
[name]  04/12/2019      [facility] [address] [place] [address] Phone: [phone] Fax: [phone]    Your procedure is scheduled on July 27  Report to [facility] Entrance A at 11:15 A.M.  Call this number if you have problems the morning of surgery:  [phone]   Remember:  Do not eat  after midnight.  You may drink clear liquids until 10:15 .  Clear liquids allowed are:                    Water, Juice (non-citric and without pulp), Carbonated beverages, Clear Tea, Black Coffee only, Plain Jell-O only, Gatorade and Plain Popsicles only    Take these medicines the morning of surgery with A SIP OF WATER :               flonase if needed              Metoprolol (toprol-xl)              Nitroglycerine if needed               7 days prior to surgery STOP taking any Aspirin (unless otherwise instructed by your surgeon), Aleve, Naproxen, Ibuprofen, Motrin, Advil, Goody's, BC's, all herbal medications, fish oil, and all vitamins.                  Follow your surgeon's instructions on when to stop Aspirin and plavix.  If no instructions were given by your surgeon then you will need to call the office to get those instructions.                    Do not wear jewelry.  Do not wear lotions, powders, or perfumes, or deodorant.  Do not shave 48 hours prior to surgery.  Men may shave face and neck.  Do not bring valuables to the hospital.  [facility] is not responsible for any belongings or valuables.  Contacts, dentures or bridgework may not be worn into surgery.  Leave your suitcase in the car.  After surgery it may be brought to your room.  For patients admitted to the hospital, discharge time will be determined by your treatment team.  Patients discharged the day of surgery will not be allowed to drive home.    Special instructions:  [facility]- Preparing For Surgery  Before surgery, you can play an important  role. Because skin is not sterile, your skin needs to be as free of germs as possible. You can reduce the number of germs on your skin by washing with CHG (chlorahexidine gluconate) Soap before surgery.  CHG is an antiseptic cleaner which kills germs and bonds with the skin to continue killing germs even after washing.    Oral Hygiene is also important to reduce your risk of infection.  Remember - BRUSH YOUR TEETH THE MORNING OF SURGERY WITH YOUR REGULAR TOOTHPASTE  Please do not use if you have an allergy to CHG or antibacterial soaps. If your skin becomes reddened/irritated stop using the CHG.  Do not shave (including legs and underarms) for at least 48 hours prior to first CHG shower. It is OK to shave your face.  Please follow these instructions carefully.   1. Shower the NIGHT BEFORE SURGERY and the MORNING OF SURGERY with CHG.   2. If you chose to wash your  hair, wash your hair first as usual with your normal shampoo.  3. After you shampoo, rinse your hair and body thoroughly to remove the shampoo.  4. Use CHG as you would any other liquid soap. You can apply CHG directly to the skin and wash gently with a scrungie or a clean washcloth.   5. Apply the CHG Soap to your body ONLY FROM THE NECK DOWN.  Do not use on open wounds or open sores. Avoid contact with your eyes, ears, mouth and genitals (private parts). Wash Face and genitals (private parts)  with your normal soap.  6. Wash thoroughly, paying special attention to the area where your surgery will be performed.  7. Thoroughly rinse your body with warm water from the neck down.  8. DO NOT shower/wash with your normal soap after using and rinsing off the CHG Soap.  9. Pat yourself dry with a CLEAN TOWEL.  10. Wear CLEAN PAJAMAS to bed the night before surgery, wear comfortable clothes the morning of surgery  11. Place CLEAN SHEETS on your bed the night of your first shower and DO NOT SLEEP WITH PETS.    Day of Surgery:  Do  not apply any deodorants/lotions.  Please wear clean clothes to the hospital/surgery center.   Remember to brush your teeth WITH YOUR REGULAR TOOTHPASTE.    Please read over the following fact sheets that you were given. Coughing and Deep Breathing and Surgical Site Infection Prevention

## 2019-04-13 ENCOUNTER — Encounter (HOSPITAL_COMMUNITY): Payer: Self-pay

## 2019-04-13 ENCOUNTER — Other Ambulatory Visit (HOSPITAL_COMMUNITY)
Admission: RE | Admit: 2019-04-13 | Discharge: 2019-04-13 | Disposition: A | Discharge status: Home / Self Care | Payer: Medicare Other | Source: Ambulatory Visit | Attending: Surgery | Admitting: Surgery

## 2019-04-13 ENCOUNTER — Other Ambulatory Visit: Payer: Self-pay

## 2019-04-13 ENCOUNTER — Encounter (HOSPITAL_COMMUNITY)
Admission: RE | Admit: 2019-04-13 | Discharge: 2019-04-13 | Disposition: A | Discharge status: Home / Self Care | Payer: Medicare Other | Source: Ambulatory Visit | Attending: Surgery | Admitting: Surgery

## 2019-04-13 DIAGNOSIS — K4020 Bilateral inguinal hernia, without obstruction or gangrene, not specified as recurrent: Secondary | ICD-10-CM | POA: Insufficient documentation

## 2019-04-13 DIAGNOSIS — Z1159 Encounter for screening for other viral diseases: Secondary | ICD-10-CM | POA: Insufficient documentation

## 2019-04-13 DIAGNOSIS — Z01812 Encounter for preprocedural laboratory examination: Secondary | ICD-10-CM | POA: Insufficient documentation

## 2019-04-13 LAB — BASIC METABOLIC PANEL
Anion gap: 10 (ref 5–15)
BUN: 12 mg/dL (ref 8–23)
CO2: 24 mmol/L (ref 22–32)
Calcium: 9.9 mg/dL (ref 8.9–10.3)
Chloride: 105 mmol/L (ref 98–111)
Creatinine, Ser: 1.03 mg/dL (ref 0.61–1.24)
GFR calc Af Amer: >60 mL/min (ref >60)
GFR calc non Af Amer: >60 mL/min (ref >60)
Glucose, Bld: 94 mg/dL (ref 70–99)
Potassium: 3.9 mmol/L (ref 3.5–5.1)
Sodium: 139 mmol/L (ref 135–145)

## 2019-04-13 LAB — CBC
HCT: 48.8 % (ref 39.0–52.0)
Hemoglobin: 16 g/dL (ref 13.0–17.0)
MCH: 29.3 pg (ref 26.0–34.0)
MCHC: 32.8 g/dL (ref 30.0–36.0)
MCV: 89.4 fL (ref 80.0–100.0)
Platelets: 200 10*3/uL (ref 150–400)
RBC: 5.46 MIL/uL (ref 4.22–5.81)
RDW: 14.3 % (ref 11.5–15.5)
WBC: 8.5 10*3/uL (ref 4.0–10.5)
nRBC: 0 % (ref 0.0–0.2)

## 2019-04-13 LAB — SARS CORONAVIRUS 2 (TAT 6-24 HRS): SARS Coronavirus 2: NEGATIVE

## 2019-04-13 LAB — PROTIME-INR
INR: 1 (ref 0.8–1.2)
Prothrombin Time: 13.5 seconds (ref 11.4–15.2)

## 2019-04-13 NOTE — Progress Notes (Signed)
PCP - Tula Nakayama @ [facility] Cardiologist - [place]  Chest x-ray - na EKG - 12/06/18 Stress Test -  ECHO - 12/29/18 Cardiac Cath - 10/31/18 (peripheral)  Sleep Study - na CPAP -   Fasting Blood Sugar - na Checks Blood Sugar _____ times a day  Blood Thinner Instructions: last dose 04/10/19 Aspirin Instructions: pt. Reports no instructions were given to him. He will call surgeon.  Anesthesia review:   Patient denies shortness of breath, fever, cough and chest pain at PAT appointment   Patient verbalized understanding of instructions that were given to them at the PAT appointment. Patient was also instructed that they will need to review over the PAT instructions again at home before surgery.

## 2019-04-14 NOTE — Anesthesia Preprocedure Evaluation (Addendum)
Anesthesia Evaluation  Patient identified by MRN, date of birth, ID band Patient awake    Reviewed: Allergy & Precautions, NPO status , Patient's Chart, lab work & pertinent test results, reviewed documented beta blocker date and time   History of Anesthesia Complications Negative for: history of anesthetic complications  Airway Mallampati: II  TM Distance: >3 FB Neck ROM: Full    Dental  (+) Poor Dentition, Missing, Dental Advisory Given   Pulmonary COPD, former smoker (quit 1971),  04/13/2019 SARS coronavirus NEG   breath sounds clear to auscultation       Cardiovascular hypertension, Pt. on medications and Pt. on home beta blockers (-) angina+ CAD, + Cardiac Stents, + Peripheral Vascular Disease and + DVT  + Valvular Problems/Murmurs (s/p TAVR)  Rhythm:Regular Rate:Normal  12/2018 ECHO: EF 45-50%, s/p TAVR peak grad 21 mmHg   Neuro/Psych negative neurological ROS     GI/Hepatic negative GI ROS, Neg liver ROS,   Endo/Other  negative endocrine ROS  Renal/GU negative Renal ROS     Musculoskeletal  (+) Arthritis , Osteoarthritis,    Abdominal   Peds  Hematology negative hematology ROS (+)   Anesthesia Other Findings   Reproductive/Obstetrics                           Anesthesia Physical Anesthesia Plan  ASA: III  Anesthesia Plan: General   Post-op Pain Management:    Induction: Intravenous  PONV Risk Score and Plan: 2 and Ondansetron and Dexamethasone  Airway Management Planned: Oral ETT  Additional Equipment:   Intra-op Plan:   Post-operative Plan: Extubation in OR  Informed Consent: I have reviewed the patients History and Physical, chart, labs and discussed the procedure including the risks, benefits and alternatives for the proposed anesthesia with the patient or authorized representative who has indicated his/her understanding and acceptance.     Dental advisory  given  Plan Discussed with: CRNA and Surgeon  Anesthesia Plan Comments: (Follows with cardiology for hx of AVR (semiurgent TAVR at [facility] 09/2017 for (new) acute decompensated heart failure with severe AI and LV dysfunction due in part to torn cusp of preciously placed bioprosthetic AVR), CAD (s/p PCI with BMS to RCA 08/10/2013), PVD (s/p atherectomy and drug-coated balloon angioplasty of his left SFA 10/31/2018). Cleared by cardiology 03/30/2019 " [name] was last seen on 01/30/2019 by Dr. Domenic Polite.  Since that day, [name] has done well without any exertional chest pain or shortness of breath. Therefore, based on ACC/AHA guidelines, the patient would be at acceptable risk for the planned procedure without further cardiovascular testing."  TTE 12/29/18: 1. The left ventricle has mildly reduced systolic function, with an ejection fraction of 45-50%. The cavity size was moderately dilated. There is mildly increased left ventricular wall thickness. Left ventricular diastolic Doppler parameters are  consistent with impaired relaxation.  2. The right ventricle has normal systolic function. The cavity was normal. There is no increase in right ventricular wall thickness.  3. Left atrial size was moderately dilated.  4. The tricuspid valve is grossly normal.  5. Post bioprosthetic AVR with valve in valve TAVR using 26 mm Sapien 3 Small perivalvular leak on septal side Valve appears to have been placed deep in ventrical Also some ? central AR. The peak gradient has increased from 15 mmHg to 21 mmHg since  12/2017 Also some unusual color flow in subcostal views in LVOT and 3 chamber view shows some diastolic flow  out by posterior wall so may be underestimating degree of AR Can consider TEE if clinicaly indicated after COVID 19 restrictions lifted.  6. The interatrial septum was not well visualized.)       Anesthesia Quick Evaluation

## 2019-04-16 NOTE — H&P (Signed)
[name] Documented: 03/28/2019 11:13 AM Location: [facility] Patient #: [id number] DOB: 11/19/1950 Single / Language: [name] / Race: Black or African American Male   History of Present Illness (Bruce Frappier A. Ninfa Linden MD; 03/28/2019 11:28 AM) The patient is a 68 year old male who presents with an inguinal hernia. This patient is referred by Dr. Tula Zhang for evaluation of bilateral inguinal hernias. He reports he has had hernias for at least 2 years. He has had previous open repairs at different locations on both sides with mesh in the past. He cannot remember when the surgeries were. He reports he is having discomfort in both his groins with swelling but no obstructive symptoms. The discomfort is mild to moderate described as a dull ache and burning. He is otherwise without complaints. He has a heart valve and is currently on Plavix. He has an extensive history of peripheral vascular disease.   Past Surgical History [name], CMA; 03/28/2019 11:13 AM) No pertinent past surgical history   Diagnostic Studies History [name], CMA; 03/28/2019 11:13 AM) Colonoscopy  1-5 years ago  Allergies [name], CMA; 03/28/2019 11:14 AM) No Known Allergies  [03/28/2019]: No Known Drug Allergies  [03/28/2019]: Allergies Reconciled   Medication History [name], CMA; 03/28/2019 11:17 AM) Flonase (50MCG/DOSE Inhaler, Nasal) Active. Lipitor (10MG  Tablet, Oral) Active. Lasix (20MG  Tablet, Oral) Active. Zestril (20MG  Tablet, Oral) Active. Metoprolol Succinate ER (50MG  Tablet ER 24HR, Oral) Active. Plavix (75MG  Tablet, Oral) Active. Heparin (Porcine) in NaCl (25000-0.45UT/250ML-% Solution, Intravenous) Active. Versed (5MG /ML Solution, Injection) Active. Medications Reconciled  Social History [name], CMA; 03/28/2019 11:13 AM) Caffeine use  Coffee. Illicit drug use  Remotely quit drug use. Tobacco use  Former smoker.  Family History  [name], [place]; 03/28/2019 11:13 AM) Family history unknown  First Degree Relatives   Other Problems [name], CMA; 03/28/2019 11:13 AM) Arthritis     Review of Systems ([name]; 03/28/2019 11:13 AM) General Not Present- Appetite Loss, Chills, Fatigue, Fever, Night Sweats, Weight Gain and Weight Loss. Skin Not Present- Change in Wart/Mole, Dryness, Hives, Jaundice, New Lesions, Non-Healing Wounds, Rash and Ulcer. HEENT Not Present- Earache, Hearing Loss, Hoarseness, Nose Bleed, Oral Ulcers, Ringing in the Ears, Seasonal Allergies, Sinus Pain, Sore Throat, Visual Disturbances, Wears glasses/contact lenses and Yellow Eyes. Respiratory Not Present- Bloody sputum, Chronic Cough, Difficulty Breathing, Snoring and Wheezing. Breast Not Present- Breast Mass, Breast Pain, Nipple Discharge and Skin Changes. Cardiovascular Not Present- Chest Pain, Difficulty Breathing Lying Down, Leg Cramps, Palpitations, Rapid Heart Rate, Shortness of Breath and Swelling of Extremities. Gastrointestinal Not Present- Abdominal Pain, Bloating, Bloody Stool, Change in Bowel Habits, Chronic diarrhea, Constipation, Difficulty Swallowing, Excessive gas, Gets full quickly at meals, Hemorrhoids, Indigestion, Nausea, Rectal Pain and Vomiting. Male Genitourinary Not Present- Blood in Urine, Change in Urinary Stream, Frequency, Impotence, Nocturia, Painful Urination, Urgency and Urine Leakage. Musculoskeletal Not Present- Back Pain, Joint Pain, Joint Stiffness, Muscle Pain, Muscle Weakness and Swelling of Extremities. Neurological Not Present- Decreased Memory, Fainting, Headaches, Numbness, Seizures, Tingling, Tremor, Trouble walking and Weakness. Psychiatric Not Present- Anxiety, Bipolar, Change in Sleep Pattern, Depression, Fearful and Frequent crying. Endocrine Not Present- Cold Intolerance, Excessive Hunger, Hair Changes, Heat Intolerance, Hot flashes and New Diabetes. Hematology Not Present- Blood Thinners,  Easy Bruising, Excessive bleeding, Gland problems, HIV and Persistent Infections.  Vitals ([name] CMA; 03/28/2019 11:18 AM) 03/28/2019 11:17 AM Weight: 119.6 lb Height: 64in Body Surface Area: 1.57 m Body Mass Index: 20.53 kg/m  Temp.: 61F (Oral)  Pulse: 97 (Regular)  BP: 156/88(Sitting, Left Arm, Standard)       Physical Exam ([name] A. Ninfa Linden MD; 03/28/2019 11:28 AM) General Mental Status-Alert. General Appearance-Consistent with stated age. Hydration-Well hydrated. Voice-Normal.  Head and Neck Head-normocephalic, atraumatic with no lesions or palpable masses. Trachea-midline.  Eye Eyeball - Bilateral-Extraocular movements intact. Sclera/Conjunctiva - Bilateral-No scleral icterus.  Chest and Lung Exam Chest and lung exam reveals -quiet, even and easy respiratory effort with no use of accessory muscles and on auscultation, normal breath sounds, no adventitious sounds and normal vocal resonance. Inspection Chest Wall - Normal. Back - normal.  Cardiovascular Cardiovascular examination reveals -normal heart sounds, regular rate and rhythm with no murmurs and normal pedal pulses bilaterally.  Abdomen Inspection Skin - Scar - no surgical scars. Hernias: Hernias - Umbilical hernia - Note: Tiny fascial defect at the umbilicus. Inguinal hernia - Left - Reducible - Left. Note: This is a fairly large left inguinal hernia which is still reducible but tender. Inguinal hernia - Right - Reducible - Right. Palpation/Percussion Palpation and Percussion of the abdomen reveal - Soft, Non Tender, No Rebound tenderness, No Rigidity (guarding) and No hepatosplenomegaly. Auscultation Auscultation of the abdomen reveals - Bowel sounds normal.  Neurologic - Did not examine.  Musculoskeletal - Did not examine.    Assessment & Plan ([name] A. Ninfa Linden MD; 03/28/2019 11:29 AM)  BILATERAL INGUINAL HERNIA (K40.20) Impression: This is a patient with  bilateral inguinal hernias. Laparoscopic bilateral inguinal hernia repair with mesh was recommended given the size of the hernias in his symptoms. I discussed the surgical procedure with him in detail. We discussed the risk of surgery which includes but is not limited to bleeding, infection, hernia recurrence, chronic pain, cardiopulmonary issues, etc. He'll need to stop his Plavix preoperatively for at least 5 days. We will get cardiac clearance from his cardiologist preoperatively as well. Surgery will then be scheduled.

## 2019-04-17 ENCOUNTER — Ambulatory Visit (HOSPITAL_COMMUNITY)
Admission: RE | Admit: 2019-04-17 | Discharge: 2019-04-17 | Disposition: A | Discharge status: Home / Self Care | Payer: Medicare Other | Attending: Surgery | Admitting: Surgery

## 2019-04-17 ENCOUNTER — Ambulatory Visit (HOSPITAL_COMMUNITY): Payer: Medicare Other | Admitting: Physician Assistant

## 2019-04-17 ENCOUNTER — Other Ambulatory Visit: Payer: Self-pay

## 2019-04-17 ENCOUNTER — Encounter (HOSPITAL_COMMUNITY)
Admission: RE | Disposition: A | Discharge status: Home / Self Care | Payer: Self-pay | Source: Home / Self Care | Attending: Surgery

## 2019-04-17 ENCOUNTER — Ambulatory Visit (HOSPITAL_COMMUNITY): Payer: Medicare Other | Admitting: Anesthesiology

## 2019-04-17 ENCOUNTER — Encounter (HOSPITAL_COMMUNITY): Payer: Self-pay

## 2019-04-17 DIAGNOSIS — M1990 Unspecified osteoarthritis, unspecified site: Secondary | ICD-10-CM | POA: Diagnosis not present

## 2019-04-17 DIAGNOSIS — J449 Chronic obstructive pulmonary disease, unspecified: Secondary | ICD-10-CM | POA: Insufficient documentation

## 2019-04-17 DIAGNOSIS — I739 Peripheral vascular disease, unspecified: Secondary | ICD-10-CM | POA: Insufficient documentation

## 2019-04-17 DIAGNOSIS — Z79899 Other long term (current) drug therapy: Secondary | ICD-10-CM | POA: Insufficient documentation

## 2019-04-17 DIAGNOSIS — I82409 Acute embolism and thrombosis of unspecified deep veins of unspecified lower extremity: Secondary | ICD-10-CM | POA: Insufficient documentation

## 2019-04-17 DIAGNOSIS — I10 Essential (primary) hypertension: Secondary | ICD-10-CM | POA: Diagnosis not present

## 2019-04-17 DIAGNOSIS — Z87891 Personal history of nicotine dependence: Secondary | ICD-10-CM | POA: Diagnosis not present

## 2019-04-17 DIAGNOSIS — K4021 Bilateral inguinal hernia, without obstruction or gangrene, recurrent: Secondary | ICD-10-CM | POA: Insufficient documentation

## 2019-04-17 DIAGNOSIS — I2510 Atherosclerotic heart disease of native coronary artery without angina pectoris: Secondary | ICD-10-CM | POA: Insufficient documentation

## 2019-04-17 DIAGNOSIS — I82509 Chronic embolism and thrombosis of unspecified deep veins of unspecified lower extremity: Secondary | ICD-10-CM | POA: Diagnosis not present

## 2019-04-17 DIAGNOSIS — K4020 Bilateral inguinal hernia, without obstruction or gangrene, not specified as recurrent: Secondary | ICD-10-CM | POA: Diagnosis not present

## 2019-04-17 DIAGNOSIS — Z7982 Long term (current) use of aspirin: Secondary | ICD-10-CM | POA: Insufficient documentation

## 2019-04-17 DIAGNOSIS — Z7901 Long term (current) use of anticoagulants: Secondary | ICD-10-CM | POA: Insufficient documentation

## 2019-04-17 DIAGNOSIS — Z1159 Encounter for screening for other viral diseases: Secondary | ICD-10-CM | POA: Diagnosis not present

## 2019-04-17 DIAGNOSIS — Z955 Presence of coronary angioplasty implant and graft: Secondary | ICD-10-CM | POA: Diagnosis not present

## 2019-04-17 DIAGNOSIS — Z5331 Laparoscopic surgical procedure converted to open procedure: Secondary | ICD-10-CM | POA: Diagnosis not present

## 2019-04-17 HISTORY — PX: INGUINAL HERNIA REPAIR: SHX194

## 2019-04-17 SURGERY — REPAIR, HERNIA, INGUINAL, LAPAROSCOPIC
Anesthesia: General | Laterality: Bilateral

## 2019-04-17 MED ORDER — FENTANYL CITRATE (PF) 250 MCG/5ML IJ SOLN
INTRAMUSCULAR | Status: DC | PRN
  Administered 2019-04-17: 50 ug via INTRAVENOUS
  Administered 2019-04-17: 75 ug via INTRAVENOUS
  Administered 2019-04-17 (×2): 50 ug via INTRAVENOUS
  Administered 2019-04-17: 75 ug via INTRAVENOUS
  Administered 2019-04-17 (×3): 50 ug via INTRAVENOUS

## 2019-04-17 MED ORDER — OXYCODONE HCL 5 MG PO TABS
ORAL_TABLET | ORAL | Status: AC
  Filled 2019-04-17: qty 1

## 2019-04-17 MED ORDER — GLYCOPYRROLATE PF 0.2 MG/ML IJ SOSY
PREFILLED_SYRINGE | INTRAMUSCULAR | Status: DC | PRN
  Administered 2019-04-17 (×2): .2 mg via INTRAVENOUS

## 2019-04-17 MED ORDER — MIDAZOLAM HCL 2 MG/2ML IJ SOLN
INTRAMUSCULAR | Status: DC | PRN
  Administered 2019-04-17: 2 mg via INTRAVENOUS

## 2019-04-17 MED ORDER — ROCURONIUM BROMIDE 10 MG/ML (PF) SYRINGE
PREFILLED_SYRINGE | INTRAVENOUS | Status: DC | PRN
  Administered 2019-04-17: 60 mg via INTRAVENOUS

## 2019-04-17 MED ORDER — MEPERIDINE HCL 25 MG/ML IJ SOLN: 6.2500 mg | INTRAMUSCULAR | Status: DC | PRN

## 2019-04-17 MED ORDER — EPHEDRINE SULFATE-NACL 50-0.9 MG/10ML-% IV SOSY
PREFILLED_SYRINGE | INTRAVENOUS | Status: DC | PRN
  Administered 2019-04-17: 10 mg via INTRAVENOUS
  Administered 2019-04-17: 5 mg via INTRAVENOUS

## 2019-04-17 MED ORDER — LIDOCAINE 2% (20 MG/ML) 5 ML SYRINGE
INTRAMUSCULAR | Status: DC | PRN
  Administered 2019-04-17: 40 mg via INTRAVENOUS

## 2019-04-17 MED ORDER — CHLORHEXIDINE GLUCONATE CLOTH 2 % EX PADS: 6.0000 | MEDICATED_PAD | Freq: Once | CUTANEOUS | Status: DC

## 2019-04-17 MED ORDER — OXYCODONE HCL 5 MG PO TABS
5.0000 mg | ORAL_TABLET | Freq: Once | ORAL | Status: AC
  Administered 2019-04-17: 5 mg via ORAL

## 2019-04-17 MED ORDER — LACTATED RINGERS IV SOLN
INTRAVENOUS | Status: DC
  Administered 2019-04-17 (×2): via INTRAVENOUS

## 2019-04-17 MED ORDER — PROPOFOL 10 MG/ML IV BOLUS
INTRAVENOUS | Status: AC
  Filled 2019-04-17: qty 20

## 2019-04-17 MED ORDER — SODIUM CHLORIDE 0.9 % IR SOLN
Status: DC | PRN
  Administered 2019-04-17: 1000 mL

## 2019-04-17 MED ORDER — MIDAZOLAM HCL 2 MG/2ML IJ SOLN: 0.5000 mg | Freq: Once | INTRAMUSCULAR | Status: DC | PRN

## 2019-04-17 MED ORDER — DEXAMETHASONE SODIUM PHOSPHATE 10 MG/ML IJ SOLN
INTRAMUSCULAR | Status: DC | PRN
  Administered 2019-04-17: 5 mg via INTRAVENOUS

## 2019-04-17 MED ORDER — ONDANSETRON HCL 4 MG/2ML IJ SOLN
INTRAMUSCULAR | Status: DC | PRN
  Administered 2019-04-17: 4 mg via INTRAVENOUS

## 2019-04-17 MED ORDER — ONDANSETRON HCL 4 MG/2ML IJ SOLN
INTRAMUSCULAR | Status: AC
  Filled 2019-04-17: qty 2

## 2019-04-17 MED ORDER — FENTANYL CITRATE (PF) 250 MCG/5ML IJ SOLN
INTRAMUSCULAR | Status: AC
  Filled 2019-04-17: qty 5

## 2019-04-17 MED ORDER — EPHEDRINE 5 MG/ML INJ
INTRAVENOUS | Status: AC
  Filled 2019-04-17: qty 20

## 2019-04-17 MED ORDER — ROCURONIUM BROMIDE 10 MG/ML (PF) SYRINGE
PREFILLED_SYRINGE | INTRAVENOUS | Status: AC
  Filled 2019-04-17: qty 20

## 2019-04-17 MED ORDER — PROPOFOL 10 MG/ML IV BOLUS
INTRAVENOUS | Status: DC | PRN
  Administered 2019-04-17: 100 mg via INTRAVENOUS

## 2019-04-17 MED ORDER — CEFAZOLIN SODIUM-DEXTROSE 2-4 GM/100ML-% IV SOLN
2.0000 g | INTRAVENOUS | Status: AC
  Administered 2019-04-17: 2 g via INTRAVENOUS
  Filled 2019-04-17: qty 100

## 2019-04-17 MED ORDER — SODIUM CHLORIDE 0.9 % IV SOLN
INTRAVENOUS | Status: DC | PRN
  Administered 2019-04-17: 10:00:00 30 ug/min via INTRAVENOUS

## 2019-04-17 MED ORDER — HYDROMORPHONE HCL 1 MG/ML IJ SOLN
INTRAMUSCULAR | Status: AC
  Filled 2019-04-17: qty 1

## 2019-04-17 MED ORDER — BUPIVACAINE HCL (PF) 0.25 % IJ SOLN
INTRAMUSCULAR | Status: DC | PRN
  Administered 2019-04-17 (×2): 15 mL

## 2019-04-17 MED ORDER — OXYCODONE HCL 5 MG PO TABS: 5.0000 mg | ORAL_TABLET | Freq: Four times a day (QID) | ORAL | 0 refills | Status: DC | PRN

## 2019-04-17 MED ORDER — BUPIVACAINE-EPINEPHRINE (PF) 0.25% -1:200000 IJ SOLN
INTRAMUSCULAR | Status: AC
  Filled 2019-04-17: qty 30

## 2019-04-17 MED ORDER — SUCCINYLCHOLINE CHLORIDE 200 MG/10ML IV SOSY
PREFILLED_SYRINGE | INTRAVENOUS | Status: AC
  Filled 2019-04-17: qty 10

## 2019-04-17 MED ORDER — ACETAMINOPHEN 500 MG PO TABS
1000.0000 mg | ORAL_TABLET | ORAL | Status: AC
  Administered 2019-04-17: 09:00:00 1000 mg via ORAL
  Filled 2019-04-17: qty 2

## 2019-04-17 MED ORDER — HYDROMORPHONE HCL 1 MG/ML IJ SOLN
0.2500 mg | INTRAMUSCULAR | Status: DC | PRN
  Administered 2019-04-17: 0.5 mg via INTRAVENOUS

## 2019-04-17 MED ORDER — PHENYLEPHRINE 40 MCG/ML (10ML) SYRINGE FOR IV PUSH (FOR BLOOD PRESSURE SUPPORT)
PREFILLED_SYRINGE | INTRAVENOUS | Status: AC
  Filled 2019-04-17: qty 20

## 2019-04-17 MED ORDER — MIDAZOLAM HCL 2 MG/2ML IJ SOLN
INTRAMUSCULAR | Status: AC
  Filled 2019-04-17: qty 2

## 2019-04-17 MED ORDER — GABAPENTIN 300 MG PO CAPS
300.0000 mg | ORAL_CAPSULE | ORAL | Status: AC
  Administered 2019-04-17: 300 mg via ORAL
  Filled 2019-04-17: qty 1

## 2019-04-17 MED ORDER — PROMETHAZINE HCL 25 MG/ML IJ SOLN: 6.2500 mg | INTRAMUSCULAR | Status: DC | PRN

## 2019-04-17 MED ORDER — GLYCOPYRROLATE PF 0.2 MG/ML IJ SOSY
PREFILLED_SYRINGE | INTRAMUSCULAR | Status: AC
  Filled 2019-04-17: qty 2

## 2019-04-17 MED ORDER — LIDOCAINE 2% (20 MG/ML) 5 ML SYRINGE
INTRAMUSCULAR | Status: AC
  Filled 2019-04-17: qty 10

## 2019-04-17 SURGICAL SUPPLY — 40 items
APPLIER CLIP LOGIC TI 5 (MISCELLANEOUS) IMPLANT
BLADE CLIPPER SURG (BLADE) ×2 IMPLANT
CHLORAPREP W/TINT 26 (MISCELLANEOUS) ×3 IMPLANT
COVER SURGICAL LIGHT HANDLE (MISCELLANEOUS) ×3 IMPLANT
COVER WAND RF STERILE (DRAPES) ×1 IMPLANT
DERMABOND ADVANCED (GAUZE/BANDAGES/DRESSINGS) ×4
DERMABOND ADVANCED .7 DNX12 (GAUZE/BANDAGES/DRESSINGS) ×1 IMPLANT
DISSECT BALLN SPACEMKR + OVL (BALLOONS) ×3
DISSECTOR BALLN SPACEMKR + OVL (BALLOONS) ×1 IMPLANT
DISSECTOR BLUNT TIP ENDO 5MM (MISCELLANEOUS) ×2 IMPLANT
DRAIN PENROSE 1/4X12 LTX (DRAIN) ×2 IMPLANT
DRAPE LAPAROSCOPIC ABDOMINAL (DRAPES) ×3 IMPLANT
ELECT BLADE 6.5 EXT (BLADE) ×2 IMPLANT
ELECT REM PT RETURN 9FT ADLT (ELECTROSURGICAL) ×6
ELECTRODE REM PT RTRN 9FT ADLT (ELECTROSURGICAL) ×1 IMPLANT
GLOVE SURG SIGNA 7.5 PF LTX (GLOVE) ×3 IMPLANT
GOWN STRL REUS W/ TWL LRG LVL3 (GOWN DISPOSABLE) ×2 IMPLANT
GOWN STRL REUS W/ TWL XL LVL3 (GOWN DISPOSABLE) ×1 IMPLANT
GOWN STRL REUS W/TWL LRG LVL3 (GOWN DISPOSABLE) ×4
GOWN STRL REUS W/TWL XL LVL3 (GOWN DISPOSABLE) ×2
KIT BASIN OR (CUSTOM PROCEDURE TRAY) ×3 IMPLANT
KIT TURNOVER KIT B (KITS) ×3 IMPLANT
MESH PARIETEX PROGRIP LEFT (Mesh General) ×2 IMPLANT
MESH PARIETEX PROGRIP RIGHT (Mesh General) ×2 IMPLANT
NS IRRIG 1000ML POUR BTL (IV SOLUTION) ×3 IMPLANT
PAD ARMBOARD 7.5X6 YLW CONV (MISCELLANEOUS) ×3 IMPLANT
PENCIL BUTTON HOLSTER BLD 10FT (ELECTRODE) ×2 IMPLANT
SET IRRIG TUBING LAPAROSCOPIC (IRRIGATION / IRRIGATOR) ×2 IMPLANT
SET TROCAR LAP APPLE-HUNT 5MM (ENDOMECHANICALS) ×3 IMPLANT
SET TUBE SMOKE EVAC HIGH FLOW (TUBING) ×3 IMPLANT
SUT MNCRL AB 4-0 PS2 18 (SUTURE) ×5 IMPLANT
SUT SILK 2 0 SH (SUTURE) ×4 IMPLANT
SUT VIC AB 2-0 CT1 27 (SUTURE) ×4
SUT VIC AB 2-0 CT1 TAPERPNT 27 (SUTURE) IMPLANT
SUT VIC AB 3-0 CT1 27 (SUTURE) ×4
SUT VIC AB 3-0 CT1 TAPERPNT 27 (SUTURE) IMPLANT
TOWEL GREEN STERILE (TOWEL DISPOSABLE) ×3 IMPLANT
TOWEL GREEN STERILE FF (TOWEL DISPOSABLE) ×3 IMPLANT
TRAY LAPAROSCOPIC MC (CUSTOM PROCEDURE TRAY) ×3 IMPLANT
WATER STERILE IRR 1000ML POUR (IV SOLUTION) ×3 IMPLANT

## 2019-04-17 NOTE — Discharge Instructions (Signed)
[facility] _______[facility]  UMBILICAL OR INGUINAL HERNIA REPAIR: POST OP INSTRUCTIONS  Always review your discharge instruction sheet given to you by the facility where your surgery was performed. IF YOU HAVE DISABILITY OR FAMILY LEAVE FORMS, YOU MUST BRING THEM TO THE OFFICE FOR PROCESSING.   DO NOT GIVE THEM TO YOUR DOCTOR.  1. A  prescription for pain medication may be given to you upon discharge.  Take your pain medication as prescribed, if needed.  If narcotic pain medicine is not needed, then you may take acetaminophen (Tylenol) or ibuprofen (Advil) as needed. 2. Take your usually prescribed medications unless otherwise directed. If you need a refill on your pain medication, please contact your pharmacy.  They will contact our office to request authorization. Prescriptions will not be filled after 5 pm or on week-ends. 3. You should follow a light diet the first 24 hours after arrival home, such as soup and crackers, etc.  Be sure to include lots of fluids daily.  Resume your normal diet the day after surgery. 4.Most patients will experience some swelling and bruising around the umbilicus or in the groin and scrotum.  Ice packs and reclining will help.  Swelling and bruising can take several days to resolve.  6. It is common to experience some constipation if taking pain medication after surgery.  Increasing fluid intake and taking a stool softener (such as Colace) will usually help or prevent this problem from occurring.  A mild laxative (Milk of Magnesia or Miralax) should be taken according to package directions if there are no bowel movements after 48 hours. 7. Unless discharge instructions indicate otherwise, you may remove your bandages 24-48 hours after surgery, and you may shower at that time.  You may have steri-strips (small skin tapes) in place directly over the incision.  These strips should be left on the skin for 7-10 days.  If your surgeon used skin glue on the  incision, you may shower in 24 hours.  The glue will flake off over the next 2-3 weeks.  Any sutures or staples will be removed at the office during your follow-up visit. 8. ACTIVITIES:  You may resume regular (light) daily activities beginning the next day--such as daily self-care, walking, climbing stairs--gradually increasing activities as tolerated.  You may have sexual intercourse when it is comfortable.  Refrain from any heavy lifting or straining until approved by your doctor.  a.You may drive when you are no longer taking prescription pain medication, you can comfortably wear a seatbelt, and you can safely maneuver your car and apply brakes. b.RETURN TO WORK:   _____________________________________________  9.You should see your doctor in the office for a follow-up appointment approximately 2-3 weeks after your surgery.  Make sure that you call for this appointment within a day or two after you arrive home to insure a convenient appointment time. 10.OTHER INSTRUCTIONS: _OK TO SHOWER STARTING TOMORROW ICE PACK, TYLENOL, AND IBUPROFEN ALSO FOR PAIN NO LIFTING MORE THAN 15 POUNDS FOR 4 WEEKS________________________    _____________________________________  WHEN TO CALL YOUR DOCTOR: 1. Fever over 101.0 2. Inability to urinate 3. Nausea and/or vomiting 4. Extreme swelling or bruising 5. Continued bleeding from incision. 6. Increased pain, redness, or drainage from the incision  The clinic staff is available to answer your questions during regular business hours.  Please don't hesitate to call and ask to speak to one of the nurses for clinical concerns.  If you have a medical emergency, go to the nearest   emergency room or call 911.  A surgeon from [facility] is always on call at the hospital   [address], [address], [place], [place]  [address] ?  [address] [address], [place], [place]   [address] [phone] ? [phone] ? FAX [phone] Web site:  [email or web address] 

## 2019-04-17 NOTE — Interval H&P Note (Signed)
History and Physical Interval Note:no change in H and P  04/17/2019 8:55 AM  [name]  has presented today for surgery, with the diagnosis of BILATERAL INGUINAL HERNIA.  The various methods of treatment have been discussed with the patient and family. After consideration of risks, benefits and other options for treatment, the patient has consented to  Procedure(s): LAPAROSCOPIC BILATERAL INGUINAL HERNIA REPAIR WITH MESH (Bilateral) as a surgical intervention.  The patient's history has been reviewed, patient examined, no change in status, stable for surgery.  I have reviewed the patient's chart and labs.  Questions were answered to the patient's satisfaction.     Coralie Keens

## 2019-04-17 NOTE — Anesthesia Postprocedure Evaluation (Signed)
Anesthesia Post Note  Patient: [name]  Procedure(s) Performed: ATTEMPTED LAPAROSCOPIC BILATERAL INGUINAL HERNIA REPAIR (Bilateral ) Hernia Repair Inguinal Adult (Bilateral )     Patient location during evaluation: PACU Anesthesia Type: General Level of consciousness: awake and alert, oriented and patient cooperative Pain management: pain level controlled Vital Signs Assessment: post-procedure vital signs reviewed and stable Respiratory status: spontaneous breathing, nonlabored ventilation and respiratory function stable Cardiovascular status: blood pressure returned to baseline and stable Postop Assessment: no apparent nausea or vomiting Anesthetic complications: no    Last Vitals:  Vitals:   04/17/19 1235 04/17/19 1305  BP: 120/63 113/79  Pulse: 70 83  Resp: 13 16  Temp: (!) 36.2 C   SpO2: 95% 97%    Last Pain:  Vitals:   04/17/19 1305  TempSrc:   PainSc: 4                  Dannya Pitkin,E. Micha Erck

## 2019-04-17 NOTE — Op Note (Signed)
[name] 04/17/2019   Pre-op Diagnosis: BILATERAL INGUINAL HERNIA     Post-op Diagnosis: same  Procedure(s): LAPAROSCOPIC CONVERTED TO OPEN BILATERAL INGUINAL HERNIA REPAIR WITH MESH  Surgeon(s): Coralie Keens, MD  Anesthesia: General  Staff:  Circulator: Gloriann Loan, RN Relief Circulator: Day, Deanna Artis, RN Scrub Person: Rosebud Poles, Jacquelyne Balint, Benjaman Lobe, RN  Estimated Blood Loss: Minimal               Findings: The patient was found to have a very large recurrent direct left inguinal hernia and a right direct and indirect inguinal hernia.  Both were repaired with pro-grip Prolene mesh from [employer]  Procedure: The patient was brought to the operating room and identifies correct patient.  He was placed upon the operating table general anesthesia was induced.  His abdomen was then prepped and draped in usual sterile fashion.  I made a small vertical incision just below the umbilicus with a scalpel.  I carried this down to the fascia which was then opened.  The rectus muscle was then elevated.  The dissecting balloon was then passed underneath the rectus muscle manipulated toward the pubis.  The dissecting balloon was then insufflated under direct vision dissecting out the preperitoneal space.  The balloon was then removed and insufflation begun with carbon dioxide.  Unfortunately, the balloon tore the peritoneum on the right side extensively.  I did place 2 5 mm trochars in the patient's lower midline under direct vision.  I was able to dissect out the large direct hernia but cannot identify the indirect sac I could not dissect out the left inguinal area at all so decision was made to convert to an open procedure.  All trochars were thus removed.  I Longitudinal incision the patient's right inguinal area with a scalpel.  I took this down through Scarpa's fascia electrocautery.  The externally fascia was then opened with internal and external rings.  The  testicular cord was a controlled the Penrose drain.  The patient had a direct as well as indirect hernia sac.  I was able to separate the indirect sac from the cord structures and tied off the base.  I then brought a piece of pro-grip Prolene mesh onto the field.  I placed against the pubic tubercle and sutured in place with 2-0 Vicryl suture.  I then brought around the cord structures and sutured in place as well.  Good coverage of the inguinal floor and internal ring appeared to be achieved.  I then closed the sterilely fascia over the top of the mesh with a running 2-0 Vicryl suture.  I anesthetized the fascia and skin further with Marcaine.  I then closed Scarpa's fascia with interrupted 3-0 Vicryl sutures and closed skin with running 4-0 Monocryl.  I likewise then closed the midline trocar sites with 4-0 Monocryl as well.  Next I made incision in the patient's left inguinal area through previous scar I took this down through Scarpa's fascia and the external oblique fascia with electrocautery.  The patient had extensive scarring from the previous hernia repair.  I did not encounter any mesh or permanent sutures.  I was able to identify the testicular cord and structures as well as a very large direct hernia sac.  I separated the sac from the surrounding scar tissue.  I then opened the sac and reduce all contents and tied off the base with a 2-0 silk suture.  There was basically no inguinal floor present.  I reinforced some soft tissue with a 2-0 silk suture.  I then brought a left-sided piece of pro-grip Prolene mesh onto the field.  I placed against the pubic tubercle and sutured in place with several 2-0 Vicryl sutures and then brought the mesh around the cord structures and sutured in place reinforcing the floor and the internal ring.  I then closed external beak fascia over the top of this with a running 2-0 Vicryl suture.  I anesthetized the fascia further with Marcaine.  I anesthetized skin as well.  I  then closed Scarpa's fascia with a 3-0 Vicryl sutures and closed skin with a running 4-0 Monocryl.  I then performed bilateral ilioinguinal nerve blocks with Marcaine as well.  Dermabond was then applied.  Patient tolerated procedure well.  All the counts were correct at the end of the procedure.  The patient was then extubated in the operating room and taken in a stable condition to the recovery room.          Coralie Keens   Date: 04/17/2019  Time: 10:57 AM

## 2019-04-17 NOTE — Anesthesia Procedure Notes (Signed)
Procedure Name: Intubation Date/Time: 04/17/2019 9:23 AM Performed by: Elayne Snare, CRNA Pre-anesthesia Checklist: Patient identified, Emergency Drugs available, Suction available and Patient being monitored Patient Re-evaluated:Patient Re-evaluated prior to induction Oxygen Delivery Method: Circle System Utilized Preoxygenation: Pre-oxygenation with 100% oxygen Induction Type: IV induction Ventilation: Mask ventilation without difficulty Laryngoscope Size: Mac and 4 Grade View: Grade I Tube type: Oral Tube size: 7.5 mm Number of attempts: 1 Airway Equipment and Method: Stylet Placement Confirmation: ETT inserted through vocal cords under direct vision,  positive ETCO2 and breath sounds checked- equal and bilateral Secured at: 21 cm Tube secured with: Tape Dental Injury: Teeth and Oropharynx as per pre-operative assessment

## 2019-04-17 NOTE — Transfer of Care (Signed)
Immediate Anesthesia Transfer of Care Note  Patient: [name]  Procedure(s) Performed: ATTEMPTED LAPAROSCOPIC BILATERAL INGUINAL HERNIA REPAIR (Bilateral ) Hernia Repair Inguinal Adult (Bilateral )  Patient Location: PACU  Anesthesia Type:General  Level of Consciousness: drowsy and patient cooperative  Airway & Oxygen Therapy: Patient Spontanous Breathing  Post-op Assessment: Report given to RN and Post -op Vital signs reviewed and stable  Post vital signs: Reviewed and stable  Last Vitals:  Vitals Value Taken Time  BP 141/63 04/17/19 1105  Temp 36.7 C 04/17/19 1105  Pulse 82 04/17/19 1105  Resp 13 04/17/19 1105  SpO2 93 % 04/17/19 1105  Vitals shown include unvalidated device data.  Last Pain:  Vitals:   04/17/19 0824  TempSrc: Oral  PainSc: 3       Patients Stated Pain Goal: 2 (53/64/68 0321)  Complications: No apparent anesthesia complications

## 2019-04-18 ENCOUNTER — Encounter (HOSPITAL_COMMUNITY): Payer: Self-pay | Admitting: Surgery

## 2019-05-17 ENCOUNTER — Encounter: Payer: Self-pay | Admitting: Family Medicine

## 2019-05-17 ENCOUNTER — Other Ambulatory Visit: Payer: Self-pay

## 2019-05-17 ENCOUNTER — Ambulatory Visit (INDEPENDENT_AMBULATORY_CARE_PROVIDER_SITE_OTHER): Payer: Medicare Other | Admitting: Family Medicine

## 2019-05-17 VITALS — BP 175/79 | HR 71 | Temp 91.0°F | Resp 15 | Ht 64.0 in | Wt 118.0 lb

## 2019-05-17 DIAGNOSIS — Z0000 Encounter for general adult medical examination without abnormal findings: Secondary | ICD-10-CM | POA: Diagnosis not present

## 2019-05-17 NOTE — Patient Instructions (Signed)
[name] , Thank you for taking time to come for your Medicare Wellness Visit. I appreciate your ongoing commitment to your health goals. Please review the following plan we discussed and let me know if I can assist you in the future.   Please continue to practice social distancing to keep you, your family, and our community safe.  If you must go out, please wear a Mask and practice good handwashing.  Screening recommendations/referrals: Colonoscopy: Due 2025 Recommended yearly ophthalmology/optometry visit for glaucoma screening and checkup Recommended yearly dental visit for hygiene and checkup  Vaccinations: Influenza vaccine: Appt next week Pneumococcal vaccine: Completed Tdap vaccine: Due 2025 Shingles vaccine: Check with insurance company on converage  Advanced directives: Sister knows wishes  Conditions/risks identified: Falls  Next appointment: 05/25/2019  Preventive Care 67 Years and Older, Male Preventive care refers to lifestyle choices and visits with your health care provider that can promote health and wellness. What does preventive care include?  A yearly physical exam. This is also called an annual well check.  Dental exams once or twice a year.  Routine eye exams. Ask your health care provider how often you should have your eyes checked.  Personal lifestyle choices, including:  Daily care of your teeth and gums.  Regular physical activity.  Eating a healthy diet.  Avoiding tobacco and drug use.  Limiting alcohol use.  Practicing safe sex.  Taking low doses of aspirin every day.  Taking vitamin and mineral supplements as recommended by your health care provider. What happens during an annual well check? The services and screenings done by your health care provider during your annual well check will depend on your age, overall health, lifestyle risk factors, and family history of disease. Counseling  Your health care provider may ask you questions  about your:  Alcohol use.  Tobacco use.  Drug use.  Emotional well-being.  Home and relationship well-being.  Sexual activity.  Eating habits.  History of falls.  Memory and ability to understand (cognition).  Work and work Statistician. Screening  You may have the following tests or measurements:  Height, weight, and BMI.  Blood pressure.  Lipid and cholesterol levels. These may be checked every 5 years, or more frequently if you are over 5 years old.  Skin check.  Lung cancer screening. You may have this screening every year starting at age 67 if you have a 30-pack-year history of smoking and currently smoke or have quit within the past 15 years.  Fecal occult blood test (FOBT) of the stool. You may have this test every year starting at age 67.  Flexible sigmoidoscopy or colonoscopy. You may have a sigmoidoscopy every 5 years or a colonoscopy every 10 years starting at age 74.  Prostate cancer screening. Recommendations will vary depending on your family history and other risks.  Hepatitis C blood test.  Hepatitis B blood test.  Sexually transmitted disease (STD) testing.  Diabetes screening. This is done by checking your blood sugar (glucose) after you have not eaten for a while (fasting). You may have this done every 1-3 years.  Abdominal aortic aneurysm (AAA) screening. You may need this if you are a current or former smoker.  Osteoporosis. You may be screened starting at age 42 if you are at high risk. Talk with your health care provider about your test results, treatment options, and if necessary, the need for more tests. Vaccines  Your health care provider may recommend certain vaccines, such as:  Influenza vaccine. This is  recommended every year.  Tetanus, diphtheria, and acellular pertussis (Tdap, Td) vaccine. You may need a Td booster every 10 years.  Zoster vaccine. You may need this after age 67.  Pneumococcal 13-valent conjugate (PCV13)  vaccine. One dose is recommended after age 67.  Pneumococcal polysaccharide (PPSV23) vaccine. One dose is recommended after age 67. Talk to your health care provider about which screenings and vaccines you need and how often you need them. This information is not intended to replace advice given to you by your health care provider. Make sure you discuss any questions you have with your health care provider. Document Released: 10/04/2015 Document Revised: 05/27/2016 Document Reviewed: 07/09/2015 Elsevier Interactive Patient Education  2017 [place] Prevention in the Home Falls can cause injuries. They can happen to people of all ages. There are many things you can do to make your home safe and to help prevent falls. What can I do on the outside of my home?  Regularly fix the edges of walkways and driveways and fix any cracks.  Remove anything that might make you trip as you walk through a door, such as a raised step or threshold.  Trim any bushes or trees on the path to your home.  Use bright outdoor lighting.  Clear any walking paths of anything that might make someone trip, such as rocks or tools.  Regularly check to see if handrails are loose or broken. Make sure that both sides of any steps have handrails.  Any raised decks and porches should have guardrails on the edges.  Have any leaves, snow, or ice cleared regularly.  Use sand or salt on walking paths during winter.  Clean up any spills in your garage right away. This includes oil or grease spills. What can I do in the bathroom?  Use night lights.  Install grab bars by the toilet and in the tub and shower. Do not use towel bars as grab bars.  Use non-skid mats or decals in the tub or shower.  If you need to sit down in the shower, use a plastic, non-slip stool.  Keep the floor dry. Clean up any water that spills on the floor as soon as it happens.  Remove soap buildup in the tub or shower regularly.   Attach bath mats securely with double-sided non-slip rug tape.  Do not have throw rugs and other things on the floor that can make you trip. What can I do in the bedroom?  Use night lights.  Make sure that you have a light by your bed that is easy to reach.  Do not use any sheets or blankets that are too big for your bed. They should not hang down onto the floor.  Have a firm chair that has side arms. You can use this for support while you get dressed.  Do not have throw rugs and other things on the floor that can make you trip. What can I do in the kitchen?  Clean up any spills right away.  Avoid walking on wet floors.  Keep items that you use a lot in easy-to-reach places.  If you need to reach something above you, use a strong step stool that has a grab bar.  Keep electrical cords out of the way.  Do not use floor polish or wax that makes floors slippery. If you must use wax, use non-skid floor wax.  Do not have throw rugs and other things on the floor that can make you trip.  What can I do with my stairs?  Do not leave any items on the stairs.  Make sure that there are handrails on both sides of the stairs and use them. Fix handrails that are broken or loose. Make sure that handrails are as long as the stairways.  Check any carpeting to make sure that it is firmly attached to the stairs. Fix any carpet that is loose or worn.  Avoid having throw rugs at the top or bottom of the stairs. If you do have throw rugs, attach them to the floor with carpet tape.  Make sure that you have a light switch at the top of the stairs and the bottom of the stairs. If you do not have them, ask someone to add them for you. What else can I do to help prevent falls?  Wear shoes that:  Do not have high heels.  Have rubber bottoms.  Are comfortable and fit you well.  Are closed at the toe. Do not wear sandals.  If you use a stepladder:  Make sure that it is fully opened. Do not climb  a closed stepladder.  Make sure that both sides of the stepladder are locked into place.  Ask someone to hold it for you, if possible.  Clearly mark and make sure that you can see:  Any grab bars or handrails.  First and last steps.  Where the edge of each step is.  Use tools that help you move around (mobility aids) if they are needed. These include:  Canes.  Walkers.  Scooters.  Crutches.  Turn on the lights when you go into a dark area. Replace any light bulbs as soon as they burn out.  Set up your furniture so you have a clear path. Avoid moving your furniture around.  If any of your floors are uneven, fix them.  If there are any pets around you, be aware of where they are.  Review your medicines with your doctor. Some medicines can make you feel dizzy. This can increase your chance of falling. Ask your doctor what other things that you can do to help prevent falls. This information is not intended to replace advice given to you by your health care provider. Make sure you discuss any questions you have with your health care provider. Document Released: 07/04/2009 Document Revised: 02/13/2016 Document Reviewed: 10/12/2014 Elsevier Interactive Patient Education  2017 [employer].

## 2019-05-17 NOTE — Progress Notes (Signed)
Subjective:   [name] is a 68 y.o. male who presents for Medicare Annual/Subsequent preventive examination.  Location of Patient: Home Location of Provider: Telehealth Consent was obtain for visit to be over via telehealth. I verified that I am speaking with the correct person using two identifiers.   Review of Systems:    Cardiac Risk Factors include: advanced age (>33men, >12 women);male gender;dyslipidemia;hypertension     Objective:    Vitals: BP (!) 175/79   Pulse 71   Temp (!) 91 F (32.8 C) (Oral)   Resp 15   Ht 5\' 4"  (1.626 m)   Wt 118 lb (53.5 kg)   BMI 20.25 kg/m   Body mass index is 20.25 kg/m.  Advanced Directives 04/13/2019 10/31/2018 05/09/2018 05/19/2017 05/12/2016 04/20/2014 08/09/2013  Does Patient Have a Medical Advance Directive? No No Yes No No Patient does not have advance directive;Patient would not like information Patient has advance directive, copy not in chart  Does patient want to make changes to medical advance directive? - - Yes (ED - Information included in AVS) - - - No  Copy of Healthcare Power of Attorney in Chart? - - - - - - Copy requested from other (Comment)  Would patient like information on creating a medical advance directive? No - Patient declined No - Patient declined - Yes (MAU/Ambulatory/Procedural Areas - Information given) Yes - Educational materials given - -  Pre-existing out of facility DNR order (yellow form or pink MOST form) - - - - - No No    Tobacco Social History   Tobacco Use  Smoking Status Former Smoker  . Packs/day: 0.50  . Years: 3.00  . Pack years: 1.50  . Types: Cigarettes  . Start date: 10/12/1965  . Quit date: 02/19/1970  . Years since quitting: 49.2  Smokeless Tobacco Never Used     Counseling given: Yes   Clinical Intake:  Pre-visit preparation completed: Yes  Pain : No/denies pain Pain Score: 0-No pain     BMI - recorded: 20.25 Nutritional Status: BMI of 19-24  Normal Nutritional  Risks: None Diabetes: No  How often do you need to have someone help you when you read instructions, pamphlets, or other written materials from your doctor or pharmacy?: 1 - Never What is the last grade level you completed in school?: 11  Interpreter Needed?: No     Past Medical History:  Diagnosis Date  . Arthritis   . CAD (coronary artery disease)    a. s/p PCI with 3.5 x 18 BMS to mid RCA, non obst LAD, Lcx patent 08/10/13  . Embolus of femoral artery [facility]) 2012   Surgical repair, previously on Coumadin  . Essential hypertension, benign   . ETOH abuse   . History of DVT (deep vein thrombosis) 2011  . History of GI bleed    February 2012   . 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 Prosthetic valve endocarditis ([place])   . PVD (peripheral vascular disease) ([place])   . S/P aortic valve replacement    Details not clear -  reportedly 1971 per patient   Past Surgical History:  Procedure Laterality Date  . ABDOMINAL AORTOGRAM W/LOWER EXTREMITY N/A 10/31/2018   Procedure: ABDOMINAL AORTOGRAM W/LOWER EXTREMITY;  Surgeon: Lorretta Harp, MD;  Location: [place] CV LAB;  Service: Cardiovascular;  Laterality: N/A;  .  Aoric valve replacement  January 2005   33mm Medtronic Freestyle stentless porcine bioprosthesis [facility])  . CARDIAC CATHETERIZATION     30-40% mid LAD, 20-30% ostial D1, proximal 30-40% RCA, 95% mid RCA thrombus, 40% distal stenosis. EF 45%, inferior HK.  [name] COLONOSCOPY N/A 04/20/2014   Procedure: COLONOSCOPY;  Surgeon: Rogene Houston, MD;  [facility];  Service: Endoscopy;  Laterality: N/A;  930  . CORONARY ANGIOPLASTY WITH STENT PLACEMENT  07/2013  . INGUINAL HERNIA REPAIR Bilateral 04/17/2019   Procedure: ATTEMPTED LAPAROSCOPIC BILATERAL INGUINAL HERNIA REPAIR;  Surgeon: Coralie Keens, MD;  Location: [place];  Service: General;  Laterality:  Bilateral;  . INGUINAL HERNIA REPAIR Bilateral 04/17/2019   Procedure: Hernia Repair Inguinal Adult;  Surgeon: Coralie Keens, MD;  Location: [place];  Service: General;  Laterality: Bilateral;  . LEFT HEART CATHETERIZATION WITH CORONARY ANGIOGRAM N/A 08/10/2013   Procedure: LEFT HEART CATHETERIZATION WITH CORONARY ANGIOGRAM;  Surgeon: Blane Ohara, MD;  Location: [facility];  Service: Cardiovascular;  Laterality: N/A;  . Left leg embolectomy  May 2012   Dr. Trula Slade - reason for lifelong coumadin  . PERIPHERAL VASCULAR ATHERECTOMY  10/31/2018   Procedure: PERIPHERAL VASCULAR ATHERECTOMY;  Surgeon: Lorretta Harp, MD;  Location: [place] CV LAB;  Service: Cardiovascular;;  left SFA   Family History  Problem Relation Age of Onset  . Diabetes Mother   . Cirrhosis Father   . COPD Sister    Social History   Socioeconomic History  . Marital status: Single    Spouse name: Not on file  . Number of children: 2  . Years of education: Not on file  . Highest education level: 11th grade  Occupational History  . Not on file  Social Needs  . Financial resource strain: Somewhat hard  . Food insecurity    Worry: Sometimes true    Inability: Sometimes true  . Transportation needs    Medical: Yes    Non-medical: Yes  Tobacco Use  . Smoking status: Former Smoker    Packs/day: 0.50    Years: 3.00    Pack years: 1.50    Types: Cigarettes    Start date: 10/12/1965    Quit date: 02/19/1970    Years since quitting: 49.2  . Smokeless tobacco: Never Used  Substance and Sexual Activity  . Alcohol use: No    Alcohol/week: 10.0 standard drinks    Types: 10 Cans of beer per week  . Drug use: No  . Sexual activity: Not Currently  Lifestyle  . Physical activity    Days per week: 3 days    Minutes per session: 20 min  . Stress: Only a little  Relationships  . Social connections    Talks on phone: Once a week    Gets together: Three times a week    Attends religious service: More than  4 times per year    Active member of club or organization: No    Attends meetings of clubs or organizations: Never    Relationship status: Divorced  Other Topics Concern  . Not on file  Social History Narrative  . Not on file    Outpatient Encounter Medications as of 05/17/2019  Medication Sig  . aspirin EC 81 MG tablet Take 1 tablet (81 mg total) by mouth daily.  [name] atorvastatin (LIPITOR) 40 MG tablet Take 1 tablet by mouth once daily (Patient taking differently: Take 40 mg by mouth daily. )  . cholecalciferol (VITAMIN D) 1000  units tablet Take 1 tablet (1,000 Units total) by mouth daily.  . clopidogrel (PLAVIX) 75 MG tablet Take 1 tablet by mouth once daily (Patient taking differently: Take 75 mg by mouth daily. )  . fluticasone (FLONASE) 50 MCG/ACT nasal spray Place 2 sprays into both nostrils daily. (Patient taking differently: Place 2 sprays into both nostrils daily as needed for allergies. )  . furosemide (LASIX) 40 MG tablet Take 1 tablet by mouth once daily (Patient taking differently: Take 40 mg by mouth daily. )  . lisinopril (ZESTRIL) 20 MG tablet Take 1 tablet by mouth once daily (Patient taking differently: Take 20 mg by mouth daily. )  . metoprolol succinate (TOPROL-XL) 50 MG 24 hr tablet TAKE 1 TABLET BY MOUTH ONCE DAILY WITH  OR  IMMEDIATELY  FOLLOWING  A  MEAL (Patient taking differently: Take 50 mg by mouth daily. )  . montelukast (SINGULAIR) 10 MG tablet TAKE 1 TABLET BY MOUTH AT BEDTIME (Patient taking differently: Take 10 mg by mouth at bedtime. )  . nitroGLYCERIN (NITROSTAT) 0.4 MG SL tablet DISSOLVE ONE TABLET UNDER THE TONGUE EVERY 5 MINUTES AS NEEDED FOR CHEST PAIN.  DO NOT EXCEED A TOTAL OF 3 DOSES IN 15 MINUTES (Patient taking differently: Place 0.4 mg under the tongue every 5 (five) minutes as needed for chest pain. )  . oxyCODONE (OXY IR/ROXICODONE) 5 MG immediate release tablet Take 1 tablet (5 mg total) by mouth every 6 (six) hours as needed for moderate pain or  severe pain.  [name] trolamine salicylate (ASPERCREME) 10 % cream Apply 1 application topically as needed for muscle pain.   No facility-administered encounter medications on file as of 05/17/2019.     Activities of Daily Living In your present state of health, do you have any difficulty performing the following activities: 05/17/2019 04/13/2019  Hearing? N N  Vision? N Y  Comment - blind in left eye  Difficulty concentrating or making decisions? N N  Walking or climbing stairs? N N  Dressing or bathing? N N  Doing errands, shopping? N -  Preparing Food and eating ? N -  Using the Toilet? N -  In the past six months, have you accidently leaked urine? N -  Do you have problems with loss of bowel control? N -  Managing your Medications? N -  Managing your Finances? N -  Housekeeping or managing your Housekeeping? N -  Some recent data might be hidden    Patient Care Team: [name], MD as PCP - General (Family Medicine) Satira Sark, MD as PCP - Cardiology (Cardiology) [name], Lavell Islam, MD (Infectious Diseases)   Assessment:   This is a routine wellness examination for [place].  Exercise Activities and Dietary recommendations Current Exercise Habits: Home exercise routine, Type of exercise: walking, Time (Minutes): 45, Frequency (Times/Week): 7, Weekly Exercise (Minutes/Week): 315, Intensity: Moderate, Exercise limited by: None identified  Goals    . maintain current exercise routine       Fall Risk Fall Risk  05/17/2019 03/08/2019 01/17/2019 09/01/2018 05/09/2018  Falls in the past year? 0 0 0 0 Yes  Number falls in past yr: 0 0 - 0 1  Injury with Fall? 0 0 0 0 No  Risk for fall due to : - - - - -   Is the patient's home free of loose throw rugs in walkways, pet beds, electrical cords, etc?   yes      Grab bars in the bathroom? no  Handrails on the stairs?   yes      Adequate lighting?   yes     Depression Screen PHQ 2/9 Scores 05/17/2019 03/08/2019  01/17/2019 09/01/2018  PHQ - 2 Score 0 0 0 0  PHQ- 9 Score - - - -    Cognitive Function     6CIT Screen 05/17/2019 05/09/2018 05/19/2017  What Year? 0 points 0 points 0 points  What month? 0 points 0 points 0 points  What time? 0 points 0 points 0 points  Count back from 20 0 points 0 points 0 points  Months in reverse 0 points 0 points 0 points  Repeat phrase 0 points 2 points 0 points  Total Score 0 2 0    Immunization History  Administered Date(s) Administered  . Influenza Split 06/11/2011, 06/07/2012  . Influenza,inj,Quad PF,6+ Mos 10/18/2013, 07/04/2014, 07/30/2015, 05/12/2016, 05/19/2017, 05/09/2018  . Pneumococcal Conjugate-13 12/12/2014  . Pneumococcal Polysaccharide-23 12/30/2016  . Tdap 10/18/2013    Qualifies for Shingles Vaccine? yes  Screening Tests Health Maintenance  Topic Date Due  . INFLUENZA VACCINE  04/22/2019  . TETANUS/TDAP  10/19/2023  . COLONOSCOPY  04/20/2024  . Hepatitis C Screening  Completed  . PNA vac Low Risk Adult  Completed   Cancer Screenings: Lung: Low Dose CT Chest recommended if Age 21-80 years, 30 pack-year currently smoking OR have quit w/in 15years. Patient does not qualify. Colorectal:  Due 2025  Additional Screenings:    Hepatitis C Screening: Completed      Plan:       1. Encounter for Medicare annual wellness exam   I have personally reviewed and noted the following in the patient's chart:   . Medical and social history . Use of alcohol, tobacco or illicit drugs  . Current medications and supplements . Functional ability and status . Nutritional status . Physical activity . Advanced directives . List of other physicians . Hospitalizations, surgeries, and ER visits in previous 12 months . Vitals . Screenings to include cognitive, depression, and falls . Referrals and appointments  In addition, I have reviewed and discussed with patient certain preventive protocols, quality metrics, and best practice  recommendations. A written personalized care plan for preventive services as well as general preventive health recommendations were provided to patient.   I provided 20 minutes of non-face-to-face time during this encounter.   Perlie Mayo, NP  05/17/2019

## 2019-05-22 ENCOUNTER — Other Ambulatory Visit: Payer: Self-pay | Admitting: Family Medicine

## 2019-05-25 ENCOUNTER — Ambulatory Visit (INDEPENDENT_AMBULATORY_CARE_PROVIDER_SITE_OTHER): Payer: Medicare Other | Admitting: Family Medicine

## 2019-05-25 ENCOUNTER — Encounter: Payer: Self-pay | Admitting: Family Medicine

## 2019-05-25 ENCOUNTER — Other Ambulatory Visit: Payer: Self-pay

## 2019-05-25 VITALS — BP 150/60 | HR 75 | Temp 98.3°F | Resp 12 | Ht 64.0 in | Wt 114.1 lb

## 2019-05-25 DIAGNOSIS — R011 Cardiac murmur, unspecified: Secondary | ICD-10-CM | POA: Diagnosis not present

## 2019-05-25 DIAGNOSIS — Z23 Encounter for immunization: Secondary | ICD-10-CM

## 2019-05-25 DIAGNOSIS — I10 Essential (primary) hypertension: Secondary | ICD-10-CM

## 2019-05-25 NOTE — Patient Instructions (Addendum)
    Thank you for coming into the office today. I appreciate the opportunity to provide you with the care for your health and wellness. Today we discussed: flu and blood pressure  Follow Up: Change current 9/28 appt to Oct  No labs today  Continue all current medications as directed.  Dr Moshe Cipro will review BP at next appt and see if medication changes are needed.  Please continue to practice social distancing to keep you, your family, and our community safe.  If you must go out, please wear a Mask and practice good handwashing.  [place] YOUR HANDS WELL AND FREQUENTLY. AVOID TOUCHING YOUR FACE, UNLESS YOUR HANDS ARE FRESHLY WASHED.  GET FRESH AIR DAILY. STAY HYDRATED WITH WATER.   It was a pleasure to see you and I look forward to continuing to work together on your health and well-being. Please do not hesitate to call the office if you need care or have questions about your care.  Have a wonderful day and week. With Gratitude, Cherly [place], DNP, AGNP-BC

## 2019-05-25 NOTE — Progress Notes (Signed)
Subjective:     Patient ID: [name], male   DOB: December 22, 1950, 68 y.o.   MRN: [id number]  [name] presents for Hypertension (BP evaluation and flu vaccine)  Here for follow-up of hypertension.  He is not exercising,  he best he can adherent to a low-salt diet.  Blood pressure he reports  well controlled at home.  SBP usually ranges 115-114 (per him).   DBP usually ranges 70.  Cardiac symptoms: none. Patient denies: chest pain, chest pressure/discomfort, claudication, dyspnea, exertional chest pressure/discomfort, fatigue, irregular heart beat, lower extremity edema, near-syncope, orthopnea, palpitations, paroxysmal nocturnal dyspnea, syncope and tachypnea. Cardiovascular risk factors: advanced age (older than 44 for men, 40 for women), dyslipidemia, hypertension, male gender and sedentary lifestyle. Use of agents associated with hypertension: none.  Denies side effects of current medications.   Today patient denies signs and symptoms of COVID 19 infection including fever, chills, cough, shortness of breath, and headache.  Past Medical, Surgical, Social History, Allergies, and Medications have been Reviewed.  Past Medical History:  Diagnosis Date  . Arthritis   . CAD (coronary artery disease)    a. s/p PCI with 3.5 x 18 BMS to mid RCA, non obst LAD, Lcx patent 08/10/13  . Embolus of femoral artery [facility]) 2012   Surgical repair, previously on Coumadin  . Essential hypertension, benign   . ETOH abuse   . History of DVT (deep vein thrombosis) 2011  . History of GI bleed    February 2012   . 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 Prosthetic valve endocarditis ([place])   . PVD (peripheral vascular disease) ([place])   . S/P aortic valve replacement    Details not clear -  reportedly 1971 per patient   Past Surgical History:  Procedure Laterality Date  .  ABDOMINAL AORTOGRAM W/LOWER EXTREMITY N/A 10/31/2018   Procedure: ABDOMINAL AORTOGRAM W/LOWER EXTREMITY;  Surgeon: Lorretta Harp, MD;  Location: [place] [facility];  Service: Cardiovascular;  Laterality: N/A;  . Aoric valve replacement  January 2005   24mm Medtronic Freestyle stentless porcine bioprosthesis [facility])  . CARDIAC CATHETERIZATION     30-40% mid LAD, 20-30% ostial D1, proximal 30-40% RCA, 95% mid RCA thrombus, 40% distal stenosis. EF 45%, inferior HK.  [name] COLONOSCOPY N/A 04/20/2014   Procedure: COLONOSCOPY;  Surgeon: Rogene Houston, MD;  [facility];  Service: Endoscopy;  Laterality: N/A;  930  . CORONARY ANGIOPLASTY WITH STENT PLACEMENT  07/2013  . INGUINAL HERNIA REPAIR Bilateral 04/17/2019   Procedure: ATTEMPTED LAPAROSCOPIC BILATERAL INGUINAL HERNIA REPAIR;  Surgeon: Coralie Keens, MD;  Location: [place];  Service: General;  Laterality: Bilateral;  . INGUINAL HERNIA REPAIR Bilateral 04/17/2019   Procedure: Hernia Repair Inguinal Adult;  Surgeon: Coralie Keens, MD;  Location: [place];  Service: General;  Laterality: Bilateral;  . LEFT HEART CATHETERIZATION WITH CORONARY ANGIOGRAM N/A 08/10/2013   Procedure: LEFT HEART CATHETERIZATION WITH CORONARY ANGIOGRAM;  Surgeon: Blane Ohara, MD;  Location: [facility] CATH LAB;  Service: Cardiovascular;  Laterality: N/A;  . Left leg embolectomy  May 2012   Dr. Trula Slade - reason for lifelong coumadin  . PERIPHERAL VASCULAR ATHERECTOMY  10/31/2018   Procedure: PERIPHERAL VASCULAR ATHERECTOMY;  Surgeon: Lorretta Harp, MD;  Location: [place] [facility] LAB;  Service: Cardiovascular;;  left SFA   Social History  Socioeconomic History  . Marital status: Single    Spouse name: Not on file  . Number of children: 2  . Years of education: Not on file  . Highest education level: 11th grade  Occupational History  . Not on file  Social Needs  . Financial resource strain: Somewhat hard  . Food insecurity    Worry: Sometimes true     Inability: Sometimes true  . Transportation needs    Medical: Yes    Non-medical: Yes  Tobacco Use  . Smoking status: Former Smoker    Packs/day: 0.50    Years: 3.00    Pack years: 1.50    Types: Cigarettes    Start date: 10/12/1965    Quit date: 02/19/1970    Years since quitting: 49.2  . Smokeless tobacco: Never Used  Substance and Sexual Activity  . Alcohol use: No    Alcohol/week: 10.0 standard drinks    Types: 10 Cans of beer per week  . Drug use: No  . Sexual activity: Not Currently  Lifestyle  . Physical activity    Days per week: 3 days    Minutes per session: 20 min  . Stress: Only a little  Relationships  . Social connections    Talks on phone: Once a week    Gets together: Three times a week    Attends religious service: More than 4 times per year    Active member of club or organization: No    Attends meetings of clubs or organizations: Never    Relationship status: Divorced  . Intimate partner violence    Fear of current or ex partner: No    Emotionally abused: No    Physically abused: No    Forced sexual activity: No  Other Topics Concern  . Not on file  Social History Narrative  . Not on file    Outpatient Encounter Medications as of 05/25/2019  Medication Sig  . aspirin EC 81 MG tablet Take 1 tablet (81 mg total) by mouth daily.  [name] atorvastatin (LIPITOR) 40 MG tablet Take 1 tablet by mouth once daily  . cholecalciferol (VITAMIN D) 1000 units tablet Take 1 tablet (1,000 Units total) by mouth daily.  . clopidogrel (PLAVIX) 75 MG tablet Take 1 tablet by mouth once daily  . fluticasone (FLONASE) 50 MCG/ACT nasal spray Place 2 sprays into both nostrils daily. (Patient taking differently: Place 2 sprays into both nostrils daily as needed for allergies. )  . furosemide (LASIX) 40 MG tablet Take 1 tablet by mouth once daily  . lisinopril (ZESTRIL) 20 MG tablet Take 1 tablet by mouth once daily  . metoprolol succinate (TOPROL-XL) 50 MG 24 hr tablet TAKE 1 TABLET  BY MOUTH ONCE DAILY WITH  OR  IMMEDIATELY  FOLLOWING  A  MEAL  . montelukast (SINGULAIR) 10 MG tablet TAKE 1 TABLET BY MOUTH AT BEDTIME  . nitroGLYCERIN (NITROSTAT) 0.4 MG SL tablet DISSOLVE ONE TABLET UNDER THE TONGUE EVERY 5 MINUTES AS NEEDED FOR CHEST PAIN.  DO NOT EXCEED A TOTAL OF 3 DOSES IN 15 MINUTES (Patient taking differently: Place 0.4 mg under the tongue every 5 (five) minutes as needed for chest pain. )  . oxyCODONE (OXY IR/ROXICODONE) 5 MG immediate release tablet Take 1 tablet (5 mg total) by mouth every 6 (six) hours as needed for moderate pain or severe pain.  [name] trolamine salicylate (ASPERCREME) 10 % cream Apply 1 application topically as needed for muscle pain.   No facility-administered encounter medications  on file as of 05/25/2019.    No Known Allergies  Review of Systems  Constitutional: Negative for chills and fever.  HENT: Negative.   Eyes: Negative.   Respiratory: Negative.  Negative for cough and shortness of breath.   Cardiovascular: Negative.   Gastrointestinal: Negative.   Endocrine: Negative.   Genitourinary: Negative.   Musculoskeletal: Negative.   Skin: Negative.   Allergic/Immunologic: Negative.   Neurological: Negative.   Hematological: Negative.   Psychiatric/Behavioral: Negative.   All other systems reviewed and are negative.      Objective:     BP (!) 164/42   Pulse 75   Temp 98.3 F (36.8 C) (Temporal)   Resp 12   Ht 5\' 4"  (1.626 m)   Wt 114 lb 1.3 oz (51.7 kg)   SpO2 99%   BMI 19.58 kg/m   Physical Exam Vitals signs and nursing note reviewed.  Constitutional:      Appearance: Normal appearance. He is well-developed, well-groomed and normal weight.  HENT:     Head: Normocephalic and atraumatic.     Right Ear: External ear normal.     Left Ear: External ear normal.     Nose: Nose normal.     Mouth/Throat:     Mouth: Mucous membranes are moist.     Pharynx: Oropharynx is clear.  Eyes:     General:        Right eye: No  discharge.        Left eye: No discharge.     Conjunctiva/sclera: Conjunctivae normal.  Neck:     Musculoskeletal: Normal range of motion and neck supple.  Cardiovascular:     Rate and Rhythm: Normal rate and regular rhythm.     Pulses: Normal pulses.     Heart sounds: Murmur present.  Pulmonary:     Effort: Pulmonary effort is normal.     Breath sounds: Normal breath sounds.  Musculoskeletal: Normal range of motion.  Skin:    General: Skin is warm.  Neurological:     General: No focal deficit present.     Mental Status: He is alert and oriented to person, place, and time.  Psychiatric:        Attention and Perception: Attention normal.        Mood and Affect: Mood normal.        Speech: Speech normal.        Behavior: Behavior normal. Behavior is cooperative.        Thought Content: Thought content normal.        Cognition and Memory: Cognition normal.        Judgment: Judgment normal.        Assessment and Plan   1. Essential hypertension, benign DANGEL MATHE is encouraged to maintain a well balanced diet that is low in salt. Concerned because his BP reports from home don't match office records. He also reports taking medication as directed, might need adjustments. Will be back in Oct to see Dr Moshe Cipro she can see if BP elevated then and  Change medications if needed.  He is also reminded that exercise is beneficial for heart health and control of  Blood pressure. 30-60 minutes daily is recommended-walking was suggested.  2. Grade 3 out of 6 intensity murmur Is followed by Cardiology. Has AI murmur noted on exam and Echo. Has had TVAR in past. No S&S today warranting urgent follow up at this time  3. Need for immunization against influenza Patient was educated on  the recommendation for flu vaccine. After obtaining informed consent, the vaccine was administered no adverse effects noted at time of administration. Patient provided with education on arm soreness and  use of tylenol or ibuprofen (if safe) for this. Encourage to use the arm vaccine was given in to help reduce the soreness. Patient educated on the signs of a reaction to the vaccine and advised to contact the office should these occur.   - Flu Vaccine QUAD High Dose(Fluad)   Follow Up: 07/06/2019  Perlie Mayo, DNP, AGNP-BC [place], [address] [place], [place] [address] Office Hours: Mon-Thurs 8 am-5 pm; Fri 8 am-12 pm Office Phone:  [phone]  Office Fax: [phone]

## 2019-05-31 ENCOUNTER — Other Ambulatory Visit (HOSPITAL_COMMUNITY): Payer: Self-pay | Admitting: Cardiovascular Disease

## 2019-05-31 ENCOUNTER — Other Ambulatory Visit: Payer: Self-pay

## 2019-05-31 ENCOUNTER — Ambulatory Visit (HOSPITAL_COMMUNITY)
Admission: RE | Admit: 2019-05-31 | Discharge: 2019-05-31 | Disposition: A | Discharge status: Home / Self Care | Payer: Medicare Other | Source: Ambulatory Visit | Attending: Cardiology | Admitting: Cardiology

## 2019-05-31 DIAGNOSIS — I739 Peripheral vascular disease, unspecified: Secondary | ICD-10-CM | POA: Diagnosis not present

## 2019-05-31 DIAGNOSIS — Z9862 Peripheral vascular angioplasty status: Secondary | ICD-10-CM

## 2019-06-09 ENCOUNTER — Ambulatory Visit (INDEPENDENT_AMBULATORY_CARE_PROVIDER_SITE_OTHER): Payer: Medicare Other | Admitting: Urology

## 2019-06-09 ENCOUNTER — Other Ambulatory Visit: Payer: Self-pay

## 2019-06-09 DIAGNOSIS — N5201 Erectile dysfunction due to arterial insufficiency: Secondary | ICD-10-CM

## 2019-06-19 ENCOUNTER — Ambulatory Visit: Payer: Medicare Other | Admitting: Family Medicine

## 2019-07-06 ENCOUNTER — Encounter: Payer: Self-pay | Admitting: Family Medicine

## 2019-07-06 ENCOUNTER — Other Ambulatory Visit: Payer: Self-pay

## 2019-07-06 ENCOUNTER — Ambulatory Visit (INDEPENDENT_AMBULATORY_CARE_PROVIDER_SITE_OTHER): Payer: Medicare Other | Admitting: Family Medicine

## 2019-07-06 VITALS — BP 160/70 | Temp 98.1°F | Ht 64.0 in | Wt 116.0 lb

## 2019-07-06 DIAGNOSIS — N528 Other male erectile dysfunction: Secondary | ICD-10-CM | POA: Diagnosis not present

## 2019-07-06 DIAGNOSIS — I10 Essential (primary) hypertension: Secondary | ICD-10-CM | POA: Diagnosis not present

## 2019-07-06 DIAGNOSIS — K4021 Bilateral inguinal hernia, without obstruction or gangrene, recurrent: Secondary | ICD-10-CM

## 2019-07-06 DIAGNOSIS — E785 Hyperlipidemia, unspecified: Secondary | ICD-10-CM

## 2019-07-06 NOTE — Patient Instructions (Addendum)
Annual physical exam with MD  Dec 13 or after, call if you need me before  Fasting lipid, cmp and EGFR, TSH today  Continue medication as before  Continue health habits , including social distancing and wearing a mask in public  Thanks for choosing [facility], we consider it a privelige to serve you.

## 2019-07-06 NOTE — Progress Notes (Signed)
   [name]     MRN: [id number]      DOB: 10/02/1950   HPI [name] is here for follow up and re-evaluation of chronic medical conditions, medication management and review of any available recent lab and radiology data.  Preventive health is updated, specifically  Cancer screening and Immunization.   No complications with recent  The PT denies any adverse reactions to current medications since the last visit.  There are no new concerns.  There are no specific complaints   ROS Denies recent fever or chills. Denies sinus pressure, nasal congestion, ear pain or sore throat. Denies chest congestion, productive cough or wheezing. Denies chest pains, palpitations and leg swelling Denies abdominal pain, nausea, vomiting,diarrhea or constipation.   Denies dysuria, frequency, hesitancy or incontinence.  Denies headaches, seizures, numbness, or tingling. Denies depression, anxiety or insomnia. Denies skin break down or rash.   PE  BP (!) 160/70   Temp 98.1 F (36.7 C) (Temporal)   Ht 5\' 4"  (1.626 m)   Wt 116 lb (52.6 kg)   SpO2 98%   BMI 19.91 kg/m   Patient alert and oriented and in no cardiopulmonary distress.  HEENT: No facial asymmetry, EOMI,     Neck supple .  Chest: Clear to auscultation bilaterally.  CVS: S1, S2 systolic  murmur, no S3.Regular rate.  ABD: Soft non tender.   Ext: No edema  MS: Adequate ROM spine, shoulders, hips and knees.  Skin: Intact, no ulcerations or rash noted.  Psych: Good eye contact, normal affect. Memory intact not anxious or depressed appearing.  CNS: CN 2-12 intact, power,  normal throughout.no focal deficits noted.   Assessment & Plan  Essential hypertension, benign Elevated pressure at visit, uncontrolled, however, states that he has not had medication on the day of the visit No med changes, he is already on polypharmacy Re eval at next visit and advised to take medicaion consistently DASH diet and commitment to daily  physical activity for a minimum of 30 minutes discussed and encouraged, as a part of hypertension management. The importance of attaining a healthy weight is also discussed.  BP/Weight 07/06/2019 05/25/2019 05/17/2019 04/17/2019 04/13/2019 03/08/2019 [id number]  Systolic BP 0000000 [id number] 0000000 [id number]  Diastolic BP 70 60 79 79 50 70 -  Wt. (Lbs) 116 114.08 118 118.8 118.8 118 120  BMI 19.91 19.58 20.25 20.39 20.39 20.25 20.6       Hyperlipidemia LDL goal <70 Hyperlipidemia:Low fat diet discussed and encouraged.   Lipid Panel  Lab Results  Component Value Date   CHOL 112 07/06/2019   HDL 42 07/06/2019   LDLCALC 57 07/06/2019   TRIG 52 07/06/2019   CHOLHDL 2.7 07/06/2019   Controlled, no change in medication     ED (erectile dysfunction) Has viagra prescribed by urology, however, he is wary of using this because of heart disease. Has not used NTG in over 1 year Will continue without medication use   Bilateral recurrent inguinal hernias Successful repair in 2020

## 2019-07-07 LAB — COMPLETE METABOLIC PANEL WITH GFR
AG Ratio: 1.4 (calc) (ref 1.0–2.5)
ALT: 8 U/L — ABNORMAL LOW (ref 9–46)
AST: 15 U/L (ref 10–35)
Albumin: 4.6 g/dL (ref 3.6–5.1)
Alkaline phosphatase (APISO): 69 U/L (ref 35–144)
BUN: 17 mg/dL (ref 7–25)
CO2: 28 mmol/L (ref 20–32)
Calcium: 9.9 mg/dL (ref 8.6–10.3)
Chloride: 99 mmol/L (ref 98–110)
Creat: 1.09 mg/dL (ref 0.70–1.25)
GFR, Est African American: 81 mL/min/{1.73_m2} (ref >=60)
GFR, Est Non African American: 70 mL/min/{1.73_m2} (ref >=60)
Globulin: 3.3 g/dL (calc) (ref 1.9–3.7)
Glucose, Bld: 86 mg/dL (ref 65–99)
Potassium: 4 mmol/L (ref 3.5–5.3)
Sodium: 136 mmol/L (ref 135–146)
Total Bilirubin: 1.1 mg/dL (ref 0.2–1.2)
Total Protein: 7.9 g/dL (ref 6.1–8.1)

## 2019-07-07 LAB — LIPID PANEL
Cholesterol: 112 mg/dL (ref <200)
HDL: 42 mg/dL (ref >=40)
LDL Cholesterol (Calc): 57 mg/dL (calc)
Non-HDL Cholesterol (Calc): 70 mg/dL (calc) (ref <130)
Total CHOL/HDL Ratio: 2.7 (calc) (ref <5.0)
Triglycerides: 52 mg/dL (ref <150)

## 2019-07-07 LAB — TSH: TSH: 0.44 mIU/L (ref 0.40–4.50)

## 2019-07-08 ENCOUNTER — Encounter: Payer: Self-pay | Admitting: Family Medicine

## 2019-07-08 NOTE — Assessment & Plan Note (Signed)
Elevated pressure at visit, uncontrolled, however, states that he has not had medication on the day of the visit No med changes, he is already on polypharmacy Re eval at next visit and advised to take medicaion consistently DASH diet and commitment to daily physical activity for a minimum of 30 minutes discussed and encouraged, as a part of hypertension management. The importance of attaining a healthy weight is also discussed.  BP/Weight 07/06/2019 05/25/2019 05/17/2019 04/17/2019 04/13/2019 03/08/2019 [id number]  Systolic BP 0000000 [id number] 0000000 [id number]  Diastolic BP 70 60 79 79 50 70 -  Wt. (Lbs) 116 114.08 118 118.8 118.8 118 120  BMI 19.91 19.58 20.25 20.39 20.39 20.25 20.6

## 2019-07-08 NOTE — Assessment & Plan Note (Signed)
Successful repair in 2020

## 2019-07-08 NOTE — Assessment & Plan Note (Signed)
Hyperlipidemia:Low fat diet discussed and encouraged.   Lipid Panel  Lab Results  Component Value Date   CHOL 112 07/06/2019   HDL 42 07/06/2019   LDLCALC 57 07/06/2019   TRIG 52 07/06/2019   CHOLHDL 2.7 07/06/2019   Controlled, no change in medication

## 2019-07-08 NOTE — Assessment & Plan Note (Signed)
Has viagra prescribed by urology, however, he is wary of using this because of heart disease. Has not used NTG in over 1 year Will continue without medication use

## 2019-08-03 ENCOUNTER — Ambulatory Visit (HOSPITAL_COMMUNITY): Payer: Medicare Other | Attending: Cardiology

## 2019-08-03 ENCOUNTER — Other Ambulatory Visit: Payer: Self-pay

## 2019-08-03 ENCOUNTER — Encounter: Payer: Self-pay | Admitting: Cardiology

## 2019-08-03 ENCOUNTER — Ambulatory Visit (INDEPENDENT_AMBULATORY_CARE_PROVIDER_SITE_OTHER): Payer: Medicare Other | Admitting: Cardiology

## 2019-08-03 VITALS — BP 158/56 | HR 52 | Temp 97.5°F | Ht 64.0 in | Wt 117.0 lb

## 2019-08-03 DIAGNOSIS — I359 Nonrheumatic aortic valve disorder, unspecified: Secondary | ICD-10-CM

## 2019-08-03 DIAGNOSIS — I25119 Atherosclerotic heart disease of native coronary artery with unspecified angina pectoris: Secondary | ICD-10-CM

## 2019-08-03 DIAGNOSIS — E782 Mixed hyperlipidemia: Secondary | ICD-10-CM | POA: Diagnosis not present

## 2019-08-03 DIAGNOSIS — Z952 Presence of prosthetic heart valve: Secondary | ICD-10-CM

## 2019-08-03 DIAGNOSIS — I429 Cardiomyopathy, unspecified: Secondary | ICD-10-CM

## 2019-08-03 NOTE — Patient Instructions (Signed)
Medication Instructions:  Your physician recommends that you continue on your current medications as directed. Please refer to the Current Medication list given to you today.  *If you need a refill on your cardiac medications before your next appointment, please call your pharmacy*  Lab Work: None today If you have labs (blood work) drawn today and your tests are completely normal, you will receive your results only by: [name] MyChart Message (if you have MyChart) OR . A paper copy in the mail If you have any lab test that is abnormal or we need to change your treatment, we will call you to review the results.  Testing/Procedures: Your physician has requested that you have an echocardiogram IN 6 MONTHS. Echocardiography is a painless test that uses sound waves to create images of your heart. It provides your doctor with information about the size and shape of your heart and how well your heart's chambers and valves are working. This procedure takes approximately one hour. There are no restrictions for this procedure.    Follow-Up: At [facility], you and your health needs are our priority.  As part of our continuing mission to provide you with exceptional heart care, we have created designated Provider Care Teams.  These Care Teams include your primary Cardiologist (physician) and Advanced Practice Providers (APPs -  Physician Assistants and Nurse Practitioners) who all work together to provide you with the care you need, when you need it.  Your next appointment:   6 months  The format for your next appointment:   In Person  Provider:   Rozann Lesches, MD  Other Instructions None

## 2019-08-03 NOTE — Progress Notes (Signed)
Cardiology Office Note  Date: 08/03/2019   ID: [name], [name] 01-Mar-1951, MRN [id number]  PCP:  Bruce Helper, MD  Cardiologist:  Bruce Lesches, MD Electrophysiologist:  None   Chief Complaint  Patient presents with  . Cardiac follow-up    History of Present Illness: [name] is a 68 y.o. male last assessed via telehealth encounter in May.  He presents for a follow-up visit.  States that he feels about the same overall, no progressive shortness of breath beyond NYHA class II, no palpitations or syncope.  No exertional chest pain.  He has a complex history of valvular heart disease with previous TAVR in 2005 and more recently valve in valve TAVR in January 2019 at [facility].  Follow-up visit with Dr. Moshe [facility] noted in October.  I reviewed his lab work.  He had an echocardiogram done in follow-up back in April of this year.  LVEF 45 to 50% range.  Aortic mean gradient is 21 mmHg he does have a perivalvular leak which corresponds to diastolic murmur on examination.  I reviewed his medications which are outlined below.  Past Medical History:  Diagnosis Date  . Arthritis   . CAD (coronary artery disease)    a. s/p PCI with 3.5 x 18 BMS to mid RCA, non obst LAD, Lcx patent 08/10/13  . Embolus of femoral artery [facility]) 2012   Surgical repair, previously on Coumadin  . Essential hypertension, benign   . ETOH abuse   . History of DVT (deep vein thrombosis) 2011  . History of GI bleed    February 2012   . 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 Prosthetic valve endocarditis ([place])   . PVD (peripheral vascular disease) ([place])   . S/P aortic valve replacement    Details not clear -  reportedly 1971 per patient    Past Surgical History:  Procedure Laterality Date  . ABDOMINAL AORTOGRAM W/LOWER EXTREMITY N/A 10/31/2018   Procedure: ABDOMINAL AORTOGRAM  W/LOWER EXTREMITY;  Surgeon: Bruce Harp, MD;  Location: [place] [facility];  Service: Cardiovascular;  Laterality: N/A;  . Aoric valve replacement  January 2005   66mm Medtronic Freestyle stentless porcine bioprosthesis [name])  . CARDIAC CATHETERIZATION     30-40% mid LAD, 20-30% ostial D1, proximal 30-40% RCA, 95% mid RCA thrombus, 40% distal stenosis. EF 45%, inferior HK.  [name] COLONOSCOPY N/A 04/20/2014   Procedure: COLONOSCOPY;  Surgeon: Bruce Houston, MD;  [facility];  Service: Endoscopy;  Laterality: N/A;  930  . CORONARY ANGIOPLASTY WITH STENT PLACEMENT  07/2013  . INGUINAL HERNIA REPAIR Bilateral 04/17/2019   Procedure: ATTEMPTED LAPAROSCOPIC BILATERAL INGUINAL HERNIA REPAIR;  Surgeon: Bruce Keens, MD;  Location: [facility];  Service: General;  Laterality: Bilateral;  . INGUINAL HERNIA REPAIR Bilateral 04/17/2019   Procedure: Hernia Repair Inguinal Adult;  Surgeon: Bruce Keens, MD;  Location: [facility];  Service: General;  Laterality: Bilateral;  . LEFT HEART CATHETERIZATION WITH CORONARY ANGIOGRAM N/A 08/10/2013   Procedure: LEFT HEART CATHETERIZATION WITH CORONARY ANGIOGRAM;  Surgeon: Bruce Ohara, MD;  Location: [facility];  Service: Cardiovascular;  Laterality: N/A;  . Left leg embolectomy  May 2012   [name] - reason for lifelong coumadin  . PERIPHERAL VASCULAR ATHERECTOMY  10/31/2018   Procedure: PERIPHERAL VASCULAR ATHERECTOMY;  Surgeon: Bruce Harp, MD;  Location: [place];  Service: Cardiovascular;;  left SFA    Current Outpatient Medications  Medication Sig Dispense Refill  . aspirin EC 81 MG tablet Take 1 tablet (81 mg total) by mouth daily. 90 tablet 3  . atorvastatin (LIPITOR) 40 MG tablet Take 1 tablet by mouth once daily 90 tablet 0  . cholecalciferol (VITAMIN D) 1000 units tablet Take 1 tablet (1,000 Units total) by mouth daily. 90 tablet 3  . clopidogrel (PLAVIX) 75 MG tablet Take 1 tablet by mouth once daily 90 tablet 0  .  fluticasone (FLONASE) 50 MCG/ACT nasal spray Place 2 sprays into both nostrils daily. (Patient taking differently: Place 2 sprays into both nostrils daily as needed for allergies. ) 16 g 6  . furosemide (LASIX) 40 MG tablet Take 1 tablet by mouth once daily 90 tablet 0  . lisinopril (ZESTRIL) 20 MG tablet Take 1 tablet by mouth once daily 90 tablet 0  . metoprolol succinate (TOPROL-XL) 50 MG 24 hr tablet TAKE 1 TABLET BY MOUTH ONCE DAILY WITH  OR  IMMEDIATELY  FOLLOWING  A  MEAL 90 tablet 0  . montelukast (SINGULAIR) 10 MG tablet TAKE 1 TABLET BY MOUTH AT BEDTIME 90 tablet 0  . nitroGLYCERIN (NITROSTAT) 0.4 MG SL tablet DISSOLVE ONE TABLET UNDER THE TONGUE EVERY 5 MINUTES AS NEEDED FOR CHEST PAIN.  DO NOT EXCEED A TOTAL OF 3 DOSES IN 15 MINUTES (Patient taking differently: Place 0.4 mg under the tongue every 5 (five) minutes as needed for chest pain. ) 25 tablet 3  . trolamine salicylate (ASPERCREME) 10 % cream Apply 1 application topically as needed for muscle pain.     No current facility-administered medications for this visit.    Allergies:  Patient has no known allergies.   Social History: The patient  reports that he quit smoking about 49 years ago. His smoking use included cigarettes. He started smoking about 53 years ago. He has a 1.50 pack-year smoking history. He has never used smokeless tobacco. He reports that he does not drink alcohol or use drugs.   ROS:  Please see the history of present illness. Otherwise, complete review of systems is positive for none.  All other systems are reviewed and negative.   Physical Exam: VS:  BP (!) 158/56   Pulse (!) 52   Temp (!) 97.5 F (36.4 C) (Temporal)   Ht 5\' 4"  (1.626 m)   Wt 117 lb (53.1 kg)   SpO2 94%   BMI 20.08 kg/m , BMI Body mass index is 20.08 kg/m.  Wt Readings from Last 3 Encounters:  08/03/19 117 lb (53.1 kg)  07/06/19 116 lb (52.6 kg)  05/25/19 114 lb 1.3 oz (51.7 kg)    General: Patient appears comfortable at rest.  HEENT: Conjunctiva and lids normal, wearing a mask. Neck: Supple, no elevated JVP or carotid bruits, no thyromegaly. Lungs: Clear to auscultation, nonlabored breathing at rest. Cardiac: Regular rate and rhythm, no S3 2/6 systolic murmur and 3/6 decrescendo diastolic murmur, no pericardial rub. Abdomen: Soft, nontender, bowel sounds present. Extremities: No pitting edema, distal pulses 2+. Skin: Warm and dry. Musculoskeletal: No kyphosis. Neuropsychiatric: Alert and oriented x3, affect grossly appropriate.  ECG:  An ECG dated 12/06/2018 was personally reviewed today and demonstrated:  Sinus bradycardia with prolonged PR interval, left anterior fascicular block, nonspecific ST-T wave abnormalities laterally.  Recent Labwork: 04/13/2019: Hemoglobin 16.0; Platelets 200 07/06/2019: ALT 8; AST 15; BUN 17; Creat 1.09; Potassium 4.0; Sodium 136; TSH 0.44  Component Value Date/Time   CHOL 112 07/06/2019 0843   TRIG 52 07/06/2019 0843   HDL 42 07/06/2019 0843   CHOLHDL 2.7 07/06/2019 0843   VLDL 12 12/30/2016 0853   LDLCALC 57 07/06/2019 0843    Other Studies Reviewed Today:  Echocardiogram 12/29/2018: 1. The left ventricle has mildly reduced systolic function, with an ejection fraction of 45-50%. The cavity size was moderately dilated. There is mildly increased left ventricular wall thickness. Left ventricular diastolic Doppler parameters are  consistent with impaired relaxation. 2. The right ventricle has normal systolic function. The cavity was normal. There is no increase in right ventricular wall thickness. 3. Left atrial size was moderately dilated. 4. The tricuspid valve is grossly normal. 5. Post bioprosthetic AVR with valve in valve TAVR using 26 mm Sapien 3 Small perivalvular leak on septal side Valve appears to have been placed deep in ventrical Also some ? central AR. The peak gradient has increased from 15 mmHg to 21 mmHg since  12/2017 Also some unusual color flow in  subcostal views in LVOT and 3 chamber view shows some diastolic flow out by posterior wall so may be underestimating degree of AR Can consider TEE if clinicaly indicated after COVID 19 restrictions lifted. 6. The interatrial septum was not well visualized.  Assessment and Plan:  1.  Complex aortic valve disease status post bioprosthetic AVR and ultimately subsequent valve in valve TAVR due to severe aortic regurgitation in the setting of torn prosthetic coronary cusp.  He remains on dual antiplatelet therapy.  Echocardiogram from April as noted above and he does have perivalvular regurgitation.  Follow-up echocardiogram be obtained prior to next visit.  He should have SBE prophylaxis with dental procedures.  2.  Secondary cardiomyopathy, LVEF 45 to 50%.  Weight is stable and he reports no leg swelling or orthopnea.  Continue Toprol-XL, lisinopril, and Lasix.  3.  CAD status post BMS to the RCA in 2014.  Continue antiplatelet regimen and statin therapy.  4.  Mixed hyperlipidemia, recent LDL 57 on Lipitor.  Medication Adjustments/Labs and Tests Ordered: Current medicines are reviewed at length with the patient today.  Concerns regarding medicines are outlined above.   Tests Ordered: Orders Placed This Encounter  Procedures  . ECHOCARDIOGRAM COMPLETE    Medication Changes: No orders of the defined types were placed in this encounter.   Disposition:  Follow up 6 months.  Signed, Satira Sark, MD, [place] [facility] 08/03/2019 10:16 AM    [facility] [place] [address] [place], [place] [address] Phone: [phone]; Fax: [phone]

## 2019-08-10 ENCOUNTER — Telehealth: Payer: Self-pay | Admitting: *Deleted

## 2019-08-10 NOTE — Telephone Encounter (Signed)
[name] with Dr Graciella Freer office called they received a fax for medical clarence from Dr Moshe Cipro however they are not able to read it and need more clarification. Would like a call back. [phone]

## 2019-08-10 NOTE — Telephone Encounter (Signed)
Called and asked for them to fax another copy over if they needed it redone

## 2019-08-10 NOTE — Telephone Encounter (Signed)
New copy completed and put in box for dr to sign

## 2019-08-29 ENCOUNTER — Other Ambulatory Visit: Payer: Self-pay | Admitting: Family Medicine

## 2019-09-12 ENCOUNTER — Encounter: Payer: Medicare Other | Admitting: Family Medicine

## 2019-09-12 ENCOUNTER — Encounter: Payer: Self-pay | Admitting: Family Medicine

## 2019-09-12 ENCOUNTER — Other Ambulatory Visit: Payer: Self-pay

## 2019-09-12 ENCOUNTER — Ambulatory Visit (INDEPENDENT_AMBULATORY_CARE_PROVIDER_SITE_OTHER): Payer: Medicare Other | Admitting: Family Medicine

## 2019-09-12 VITALS — BP 136/48 | HR 51 | Temp 97.6°F | Resp 15 | Ht 64.0 in | Wt 115.8 lb

## 2019-09-12 DIAGNOSIS — I10 Essential (primary) hypertension: Secondary | ICD-10-CM | POA: Diagnosis not present

## 2019-09-12 DIAGNOSIS — Z0001 Encounter for general adult medical examination with abnormal findings: Secondary | ICD-10-CM | POA: Diagnosis not present

## 2019-09-12 DIAGNOSIS — R011 Cardiac murmur, unspecified: Secondary | ICD-10-CM

## 2019-09-12 DIAGNOSIS — E785 Hyperlipidemia, unspecified: Secondary | ICD-10-CM

## 2019-09-12 DIAGNOSIS — J3089 Other allergic rhinitis: Secondary | ICD-10-CM | POA: Diagnosis not present

## 2019-09-12 DIAGNOSIS — Z79899 Other long term (current) drug therapy: Secondary | ICD-10-CM | POA: Diagnosis not present

## 2019-09-12 DIAGNOSIS — R590 Localized enlarged lymph nodes: Secondary | ICD-10-CM | POA: Diagnosis not present

## 2019-09-12 NOTE — Progress Notes (Signed)
[name] is a 68 y.o. male who presents for annual wellness visit and follow-up on chronic medical conditions.  He denies any concerns today in office. Reports taking his medications as directed.   Immunization History  Administered Date(s) Administered  . Fluad Quad(high Dose 65+) 05/25/2019  . Influenza Split 06/11/2011, 06/07/2012  . Influenza,inj,Quad PF,6+ Mos 10/18/2013, 07/04/2014, 07/30/2015, 05/12/2016, 05/19/2017, 05/09/2018  . Pneumococcal Conjugate-13 12/12/2014  . Pneumococcal Polysaccharide-23 12/30/2016  . Tdap 10/18/2013   Last colonoscopy: 2015 Last PSA: 3.8- 08/2018 Dentist: Needs to have some repairs  Ophtho: Not lately-vision is okay Left eye blind  Exercise: Walks daily, house work, yard [employer] outside   [employer] Discussion:  Patient does not have a living will and medical power of attorney. Sister is POA  Review of Systems  Constitutional: Negative.   HENT: Negative.   Eyes: Negative.   Respiratory: Negative.   Cardiovascular: Negative.   Gastrointestinal: Negative.   Endocrine: Negative.   Genitourinary: Negative.   Musculoskeletal: Negative.   Skin: Negative.   Allergic/Immunologic: Negative.   Neurological: Negative.   Hematological: Negative.   Psychiatric/Behavioral: Negative.   All other systems reviewed and are negative.   PHYSICAL EXAM:  BP (!) 136/48   Pulse (!) 51   Temp 97.6 F (36.4 C) (Oral)   Resp 15   Ht 5\' 4"  (1.626 m)   Wt 115 lb 12.8 oz (52.5 kg)   SpO2 96%   BMI 19.88 kg/m   Physical Exam Vitals and nursing note reviewed.  Constitutional:      Appearance: Normal appearance. He is well-developed, well-groomed and normal weight.  HENT:     Head: Normocephalic and atraumatic.     Right Ear: Tympanic membrane, ear canal and external ear normal.     Left Ear: Tympanic membrane, ear canal and external ear normal.     Nose: Nose normal.     Mouth/Throat:     Mouth: Mucous membranes are moist.   Pharynx: Oropharynx is clear.  Eyes:     General: Lids are normal.        Right eye: No discharge.        Left eye: No discharge.     Extraocular Movements: Extraocular movements intact.     Conjunctiva/sclera: Conjunctivae normal.     Pupils: Pupils are equal, round, and reactive to light.  Cardiovascular:     Rate and Rhythm: Normal rate and regular rhythm.     Pulses: Normal pulses.     Heart sounds: Normal heart sounds.  Pulmonary:     Effort: Pulmonary effort is normal.     Breath sounds: Normal breath sounds.  Abdominal:     General: Bowel sounds are normal. There is no distension.     Palpations: Abdomen is soft.     Tenderness: There is no abdominal tenderness. There is no right CVA tenderness or left CVA tenderness.     Hernia: No hernia is present.  Musculoskeletal:        General: Normal range of motion.     Cervical back: Normal range of motion and neck supple.     Right lower leg: No edema.     Left lower leg: No edema.  Lymphadenopathy:     Head:     Left side of head: No submental, submandibular, preauricular, posterior auricular or occipital adenopathy.     Cervical: Cervical adenopathy present.     Left cervical: No superficial, deep or posterior cervical adenopathy.  Upper Body:     Left upper body: Supraclavicular adenopathy present. No axillary adenopathy.  Skin:    General: Skin is warm and dry.     Capillary Refill: Capillary refill takes less than 2 seconds.  Neurological:     General: No focal deficit present.     Mental Status: He is alert and oriented to person, place, and time.     Cranial Nerves: Cranial nerves are intact.     Sensory: Sensation is intact.     Motor: Motor function is intact.     Coordination: Coordination is intact.     Gait: Gait is intact.     Deep Tendon Reflexes: Reflexes are normal and symmetric.  Psychiatric:        Attention and Perception: Attention and perception normal.        Mood and Affect: Mood and affect  normal.        Speech: Speech normal.        Behavior: Behavior normal. Behavior is cooperative.        Thought Content: Thought content normal.        Cognition and Memory: Cognition and memory normal.        Judgment: Judgment normal.      ASSESSMENT/PLAN:  1. Annual visit for general adult medical examination with abnormal findings - PSA - CBC with Differential/Platelet - TSH - COMPLETE METABOLIC PANEL WITH GFR  2. Non-seasonal allergic rhinitis due to other allergic trigger  3. Grade 3 out of 6 intensity murmur  4. Hyperlipidemia LDL goal <70  5. Essential hypertension, benign  6. Supraclavicular lymphadenopathy  - CBC with Differential/Platelet - COMPLETE METABOLIC PANEL WITH GFR - CT CHEST W CONTRAST; Future - CT SOFT TISSUE NECK W CONTRAST; Future   The patient's weight, height, BMI, and visual acuity have been recorded in the chart.  I have made referrals, counseling, and provided education to the patient based on review of the above and I have provided the patient with a written personalized care plan for preventive services.     Perlie Mayo, NP   09/16/2019

## 2019-09-12 NOTE — Patient Instructions (Addendum)
  I appreciate the opportunity to provide you with care for your health and wellness. Today we discussed: overall health   Follow up: 1 months   Labs today   CT of chest and neck ordered to be at [facility] will call when you can get these.  HEALTH MAINTENANCE RECOMMENDATIONS:  It is recommended that you get at least 30 minutes of aerobic exercise at least 5 days/week (for weight loss, you may need as much as 60-90 minutes). This can be any activity that gets your heart rate up. This can be divided in 10-15 minute intervals if needed, but try and build up your endurance at least once a week.  Weight bearing exercise is also recommended twice weekly.  Eat a healthy diet with lots of vegetables, fruits and fiber.  "Colorful" foods have a lot of vitamins (ie green vegetables, tomatoes, red peppers, etc).  Limit sweet tea, regular sodas and alcoholic beverages, all of which has a lot of calories and sugar.  Up to 2 alcoholic drinks daily may be beneficial for men (unless trying to lose weight, watch sugars).  Drink a lot of water.  Sunscreen of at least SPF 30 should be used on all sun-exposed parts of the skin when outside between the hours of 10 am and 4 pm (not just when at beach or pool, but even with exercise, golf, tennis, and yard work!)  Use a sunscreen that says "broad spectrum" so it covers both UVA and UVB rays, and make sure to reapply every 1-2 hours.  Remember to change the batteries in your smoke detectors when changing your clock times in the spring and fall.  Use your seat belt every time you are in a car, and please drive safely and not be distracted with cell phones and texting while driving.  I hope you have a wonderful, happy, safe, and healthy Holiday Season! See you in the New Year :)  Please continue to practice social distancing to keep you, your family, and our community safe.  If you must go out, please wear a mask and practice good handwashing.  It was a pleasure  to see you and I look forward to continuing to work together on your health and well-being. Please do not hesitate to call the office if you need care or have questions about your care.  Have a wonderful day and week. With Gratitude, Cherly Beach, DNP, AGNP-BC

## 2019-09-13 LAB — CBC WITH DIFFERENTIAL/PLATELET
Absolute Monocytes: 712 cells/uL (ref 200–950)
Basophils Absolute: 71 cells/uL (ref 0–200)
Basophils Relative: 0.8 %
Eosinophils Absolute: 312 cells/uL (ref 15–500)
Eosinophils Relative: 3.5 %
HCT: 46.7 % (ref 38.5–50.0)
Hemoglobin: 15.3 g/dL (ref 13.2–17.1)
Lymphs Abs: 1611 cells/uL (ref 850–3900)
MCH: 28.5 pg (ref 27.0–33.0)
MCHC: 32.8 g/dL (ref 32.0–36.0)
MCV: 87.1 fL (ref 80.0–100.0)
MPV: 10.1 fL (ref 7.5–12.5)
Monocytes Relative: 8 %
Neutro Abs: 6194 cells/uL (ref 1500–7800)
Neutrophils Relative %: 69.6 %
Platelets: 264 10*3/uL (ref 140–400)
RBC: 5.36 10*6/uL (ref 4.20–5.80)
RDW: 13.3 % (ref 11.0–15.0)
Total Lymphocyte: 18.1 %
WBC: 8.9 10*3/uL (ref 3.8–10.8)

## 2019-09-13 LAB — COMPLETE METABOLIC PANEL WITH GFR
AG Ratio: 1.4 (calc) (ref 1.0–2.5)
ALT: 14 U/L (ref 9–46)
AST: 19 U/L (ref 10–35)
Albumin: 4.4 g/dL (ref 3.6–5.1)
Alkaline phosphatase (APISO): 65 U/L (ref 35–144)
BUN: 13 mg/dL (ref 7–25)
CO2: 27 mmol/L (ref 20–32)
Calcium: 9.4 mg/dL (ref 8.6–10.3)
Chloride: 98 mmol/L (ref 98–110)
Creat: 0.98 mg/dL (ref 0.70–1.25)
GFR, Est African American: 92 mL/min/{1.73_m2} (ref >=60)
GFR, Est Non African American: 79 mL/min/{1.73_m2} (ref >=60)
Globulin: 3.1 g/dL (calc) (ref 1.9–3.7)
Glucose, Bld: 87 mg/dL (ref 65–99)
Potassium: 4 mmol/L (ref 3.5–5.3)
Sodium: 135 mmol/L (ref 135–146)
Total Bilirubin: 0.9 mg/dL (ref 0.2–1.2)
Total Protein: 7.5 g/dL (ref 6.1–8.1)

## 2019-09-13 LAB — PSA: PSA: 5.1 ng/mL — ABNORMAL HIGH (ref <=4.0)

## 2019-09-13 LAB — TSH: TSH: 0.45 mIU/L (ref 0.40–4.50)

## 2019-09-16 ENCOUNTER — Encounter: Payer: Self-pay | Admitting: Family Medicine

## 2019-09-16 ENCOUNTER — Other Ambulatory Visit: Payer: Self-pay | Admitting: Family Medicine

## 2019-09-16 DIAGNOSIS — R972 Elevated prostate specific antigen [PSA]: Secondary | ICD-10-CM

## 2019-09-16 NOTE — Assessment & Plan Note (Signed)
New onset lymphadenopathy-CT scan ordered

## 2019-09-16 NOTE — Assessment & Plan Note (Signed)
Improved-continue Flonase

## 2019-09-16 NOTE — Assessment & Plan Note (Signed)
Valve replacement-followed by Cards Continue plavix

## 2019-09-16 NOTE — Assessment & Plan Note (Addendum)
Discussed PSA screening (risks/benefits), recommended at least 30 minutes of aerobic activity at least 5 days/week; proper sunscreen use reviewed; healthy diet and alcohol recommendations (less than or equal to 2 drinks/day) reviewed; regular seatbelt use; changing batteries in smoke detectors. Immunization recommendations discussed.  Colonoscopy recommendations reviewed.  Updated labs ordered.

## 2019-09-16 NOTE — Assessment & Plan Note (Signed)
Controlled, continue Lipitor and encouraged heart healthy diet.

## 2019-09-19 ENCOUNTER — Ambulatory Visit (HOSPITAL_COMMUNITY)
Admission: RE | Admit: 2019-09-19 | Discharge: 2019-09-19 | Disposition: A | Discharge status: Home / Self Care | Payer: Medicare Other | Source: Ambulatory Visit | Attending: Family Medicine | Admitting: Family Medicine

## 2019-09-19 ENCOUNTER — Telehealth: Payer: Self-pay

## 2019-09-19 ENCOUNTER — Other Ambulatory Visit: Payer: Self-pay

## 2019-09-19 DIAGNOSIS — R599 Enlarged lymph nodes, unspecified: Secondary | ICD-10-CM | POA: Diagnosis not present

## 2019-09-19 DIAGNOSIS — I700 Atherosclerosis of aorta: Secondary | ICD-10-CM | POA: Diagnosis not present

## 2019-09-19 DIAGNOSIS — R590 Localized enlarged lymph nodes: Secondary | ICD-10-CM | POA: Diagnosis not present

## 2019-09-19 DIAGNOSIS — I719 Aortic aneurysm of unspecified site, without rupture: Secondary | ICD-10-CM | POA: Diagnosis not present

## 2019-09-19 MED ORDER — IOHEXOL 300 MG/ML  SOLN
150.0000 mL | Freq: Once | INTRAMUSCULAR | Status: AC | PRN
  Administered 2019-09-19: 150 mL via INTRAVENOUS

## 2019-09-19 NOTE — Telephone Encounter (Signed)
Please call [facility] for a call report for pt

## 2019-09-19 NOTE — Telephone Encounter (Signed)
Stacy with [facility] called. Patients ct showed a cluster of enlarged lymph nodes and tissue sampling is recommended per Dr.Patel.

## 2019-09-20 ENCOUNTER — Other Ambulatory Visit: Payer: Self-pay | Admitting: Family Medicine

## 2019-09-20 DIAGNOSIS — R634 Abnormal weight loss: Secondary | ICD-10-CM

## 2019-09-20 DIAGNOSIS — R591 Generalized enlarged lymph nodes: Secondary | ICD-10-CM

## 2019-09-22 DIAGNOSIS — C801 Malignant (primary) neoplasm, unspecified: Secondary | ICD-10-CM

## 2019-09-22 HISTORY — DX: Malignant (primary) neoplasm, unspecified: C80.1

## 2019-09-28 ENCOUNTER — Inpatient Hospital Stay (HOSPITAL_COMMUNITY): Payer: Medicare Other

## 2019-09-28 ENCOUNTER — Encounter (HOSPITAL_COMMUNITY): Payer: Self-pay | Admitting: Surgery

## 2019-09-28 ENCOUNTER — Other Ambulatory Visit: Payer: Self-pay

## 2019-09-28 ENCOUNTER — Encounter (HOSPITAL_COMMUNITY): Payer: Self-pay | Admitting: Hematology

## 2019-09-28 ENCOUNTER — Inpatient Hospital Stay (HOSPITAL_COMMUNITY): Payer: Medicare Other | Attending: Hematology | Admitting: Hematology

## 2019-09-28 VITALS — BP 154/51 | HR 60 | Temp 97.5°F | Resp 18 | Ht 64.0 in | Wt 116.8 lb

## 2019-09-28 DIAGNOSIS — I739 Peripheral vascular disease, unspecified: Secondary | ICD-10-CM | POA: Diagnosis not present

## 2019-09-28 DIAGNOSIS — Z0001 Encounter for general adult medical examination with abnormal findings: Secondary | ICD-10-CM | POA: Insufficient documentation

## 2019-09-28 DIAGNOSIS — Z79899 Other long term (current) drug therapy: Secondary | ICD-10-CM | POA: Diagnosis not present

## 2019-09-28 DIAGNOSIS — Z7982 Long term (current) use of aspirin: Secondary | ICD-10-CM | POA: Insufficient documentation

## 2019-09-28 DIAGNOSIS — R591 Generalized enlarged lymph nodes: Secondary | ICD-10-CM | POA: Diagnosis not present

## 2019-09-28 DIAGNOSIS — R590 Localized enlarged lymph nodes: Secondary | ICD-10-CM | POA: Insufficient documentation

## 2019-09-28 DIAGNOSIS — Z7902 Long term (current) use of antithrombotics/antiplatelets: Secondary | ICD-10-CM | POA: Diagnosis not present

## 2019-09-28 DIAGNOSIS — R972 Elevated prostate specific antigen [PSA]: Secondary | ICD-10-CM | POA: Diagnosis not present

## 2019-09-28 DIAGNOSIS — I2510 Atherosclerotic heart disease of native coronary artery without angina pectoris: Secondary | ICD-10-CM | POA: Insufficient documentation

## 2019-09-28 DIAGNOSIS — I509 Heart failure, unspecified: Secondary | ICD-10-CM | POA: Insufficient documentation

## 2019-09-28 DIAGNOSIS — F1210 Cannabis abuse, uncomplicated: Secondary | ICD-10-CM | POA: Diagnosis not present

## 2019-09-28 DIAGNOSIS — I119 Hypertensive heart disease without heart failure: Secondary | ICD-10-CM | POA: Insufficient documentation

## 2019-09-28 DIAGNOSIS — Z87891 Personal history of nicotine dependence: Secondary | ICD-10-CM | POA: Diagnosis not present

## 2019-09-28 DIAGNOSIS — Z86718 Personal history of other venous thrombosis and embolism: Secondary | ICD-10-CM | POA: Insufficient documentation

## 2019-09-28 LAB — HEPATITIS B SURFACE ANTIBODY,QUALITATIVE: Hep B S Ab: NONREACTIVE

## 2019-09-28 LAB — HEPATITIS B CORE ANTIBODY, TOTAL: Hep B Core Total Ab: NONREACTIVE

## 2019-09-28 LAB — LACTATE DEHYDROGENASE: LDH: 194 U/L — ABNORMAL HIGH (ref 98–192)

## 2019-09-28 LAB — HEPATITIS C ANTIBODY: HCV Ab: NONREACTIVE

## 2019-09-28 LAB — HIV ANTIBODY (ROUTINE TESTING W REFLEX): HIV Screen 4th Generation wRfx: NONREACTIVE

## 2019-09-28 LAB — HEPATITIS B SURFACE ANTIGEN: Hepatitis B Surface Ag: NONREACTIVE

## 2019-09-28 NOTE — Assessment & Plan Note (Addendum)
1.  Left supraclavicular adenopathy: -Patient found to have left supraclavicular adenopathy on routine physical examination 2 weeks ago by Cherly Beach, NP. -He reports that he had these lymph nodes for many years. -He denies any fevers, night sweats or weight loss. -He denies any dysphagia or odynophagia.  He smoked 1 pack/week for 5 to 6 years and quit in 1970s.  He also drank alcohol heavily at that time for 3 to 5 years and quit in the 1970s. -Oropharyngeal exam did not reveal any suspicious masses.  Physical examination shows cluster of lymph nodes in the supraclavicular fossa. -I have reviewed CT of the neck independently dated 09/19/2019 which showed cluster of enlarged level 5 lymph nodes in the supraclavicular fossa.  Largest measures 1.8 cm. -No other oropharyngeal masses visualized.  CT of the chest did not reveal any adenopathy. -He has an ENT appointment on 10/03/2019 with Dr. Lucia Gaskins in [place]. -I have recommended doing a PET CT scan.  We will also check an LDH level, hepatitis B and C serology and HIV. -I will see him back after the PET CT scan.  2.  Difficulty urination: -He has urinary symptoms with difficulty initiating the stream.  His PSA is elevated at 5.1. -He also has an appointment to see Dr. Alyson Ingles soon.

## 2019-09-28 NOTE — Patient Instructions (Addendum)
[place] at [facility] Discharge Instructions  You were seen today by Dr. Delton Coombes. He went over your history, family history and how you've been feeling lately. He will have blood work drawn today. He will schedule you for a PET scan to further evaluate you. He will see you back after your scan for follow up.   Thank you for choosing [facility] at [facility] to provide your oncology and hematology care.  To afford each patient quality time with our provider, please arrive at least 15 minutes before your scheduled appointment time.   If you have a lab appointment with the [facility] please come in thru the  Main Entrance and check in at the main information desk  You need to re-schedule your appointment should you arrive 10 or more minutes late.  We strive to give you quality time with our providers, and arriving late affects you and other patients whose appointments are after yours.  Also, if you no show three or more times for appointments you may be dismissed from the clinic at the providers discretion.     Again, thank you for choosing [facility].  Our hope is that these requests will decrease the amount of time that you wait before being seen by our physicians.       _____________________________________________________________  Should you have questions after your visit to [facility], please contact our office at [phone] between the hours of 8:00 a.m. and 4:30 p.m.  Voicemails left after 4:00 p.m. will not be returned until the following business day.  For prescription refill requests, have your pharmacy contact our office and allow 72 hours.    Cancer Center Support Programs:   > Cancer Support Group  2nd Tuesday of the month 1pm-2pm, Journey Room

## 2019-09-28 NOTE — Progress Notes (Signed)
[facility] NOTE  Patient Care Team: [name], MD as PCP - General (Family Medicine) Satira Sark, MD as PCP - Cardiology (Cardiology) [name], Lavell Islam, MD (Infectious Diseases)  CHIEF COMPLAINTS/PURPOSE OF CONSULTATION:  Left supraclavicular adenopathy.  HISTORY OF PRESENTING ILLNESS:  [name] 69 y.o. male is seen for evaluation of left supraclavicular adenopathy at the request of Cherly Beach, NP.  [name] was found to have left supraclavicular enlarged lymph nodes on routine physical examination.  He denies any fevers, night sweats or weight loss.  Subsequent CT of the neck on 09/19/2019 shows cluster of enlarged level 5 lymph nodes underlying the skin marker is above the level of the clavicle.  Largest lymph node measures 1.8 cm.  CT of the chest did not reveal any other palpable masses.  He denies any dysphagia or odynophagia.  Patient reports that he had felt these lymph nodes for several years.  He lives by himself and is independent of all ADLs and IADLs.  He smoked about 1 pack/week for 5 to 6 years.  He drank 2 to 3 cases of beer per day for 3 to 5 years and quit both smoking and drinking in the 1970s.  He has 2 children.  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  No family history of malignancies.  He also reported some difficulty starting the stream.  Appetite is 100%.  Energy levels are 75%.  Denies any recurrent infections or hospitalizations.  MEDICAL HISTORY:  Past Medical History:  Diagnosis Date  . Arterial embolus of lower extremity ([place]) 11/23/2013  . Arthritis   . CAD (coronary artery disease)    a. s/p PCI with 3.5 x 18 BMS to mid RCA, non obst LAD, Lcx patent 08/10/13  . Embolus of femoral artery [facility]) 2012   Surgical repair, previously on Coumadin  . Essential hypertension, benign   . ETOH abuse   . History of DVT (deep vein thrombosis) 2011  . History of GI bleed    February 2012   . 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 Prosthetic valve endocarditis ([place])   . PVD (peripheral vascular disease) ([place])   . S/P aortic valve replacement    Details not clear -  reportedly 1971 per patient    SURGICAL HISTORY: Past Surgical History:  Procedure Laterality Date  . ABDOMINAL AORTOGRAM W/LOWER EXTREMITY N/A 10/31/2018   Procedure: ABDOMINAL AORTOGRAM W/LOWER EXTREMITY;  Surgeon: Lorretta Harp, MD;  Location: [place];  Service: Cardiovascular;  Laterality: N/A;  . Aoric valve replacement  January 2005   53mm Medtronic Freestyle stentless porcine bioprosthesis [facility])  . CARDIAC CATHETERIZATION     30-40% mid LAD, 20-30% ostial D1, proximal 30-40% RCA, 95% mid RCA thrombus, 40% distal stenosis. EF 45%, inferior HK.  [name] COLONOSCOPY N/A 04/20/2014   Procedure: COLONOSCOPY;  Surgeon: Rogene Houston, MD;  [facility];  Service: Endoscopy;  Laterality: N/A;  930  . CORONARY ANGIOPLASTY WITH STENT PLACEMENT  07/2013  . INGUINAL HERNIA REPAIR Bilateral 04/17/2019   Procedure: ATTEMPTED LAPAROSCOPIC BILATERAL INGUINAL HERNIA REPAIR;  Surgeon: Coralie Keens, MD;  Location: [place];  Service: General;  Laterality: Bilateral;  . INGUINAL HERNIA REPAIR Bilateral 04/17/2019   Procedure: Hernia Repair Inguinal Adult;  Surgeon: Coralie Keens, MD;  Location: [place];  Service: General;  Laterality: Bilateral;  . LEFT HEART CATHETERIZATION WITH CORONARY ANGIOGRAM N/A 08/10/2013   Procedure: LEFT HEART CATHETERIZATION WITH CORONARY ANGIOGRAM;  Surgeon: Blane Ohara, MD;  Location: [facility] CATH LAB;  Service: Cardiovascular;  Laterality: N/A;  . Left leg embolectomy  May 2012   Dr. Trula Slade - reason for lifelong coumadin  . PERIPHERAL VASCULAR ATHERECTOMY  10/31/2018   Procedure: PERIPHERAL VASCULAR ATHERECTOMY;  Surgeon: Lorretta Harp, MD;  Location: [place] [facility];  Service:  Cardiovascular;;  left SFA    SOCIAL HISTORY: Social History   Socioeconomic History  . Marital status: Single    Spouse name: Not on file  . Number of children: 2  . Years of education: Not on file  . Highest education level: 11th grade  Occupational History  . Occupation: disability  Tobacco Use  . Smoking status: Former Smoker    Packs/day: 0.50    Years: 3.00    Pack years: 1.50    Types: Cigarettes    Start date: 10/12/1965    Quit date: 02/19/1970    Years since quitting: 49.6  . Smokeless tobacco: Never Used  Substance and Sexual Activity  . Alcohol use: Not Currently  . Drug use: Yes    Types: Marijuana    Comment: pt smokes weed every once in while  . Sexual activity: Not Currently  Other Topics Concern  . Not on file  Social History Narrative  . Not on file   Social Determinants of Health   Financial Resource Strain:   . Difficulty of Paying Living Expenses: Not on file  Food Insecurity:   . Worried About Charity fundraiser in the Last Year: Not on file  . Ran Out of Food in the Last Year: Not on file  Transportation Needs:   . Lack of Transportation (Medical): Not on file  . Lack of Transportation (Non-Medical): Not on file  Physical Activity:   . Days of Exercise per Week: Not on file  . Minutes of Exercise per Session: Not on file  Stress:   . Feeling of Stress : Not on file  Social Connections:   . Frequency of Communication with Friends and Family: Not on file  . Frequency of Social Gatherings with Friends and Family: Not on file  . Attends Religious Services: Not on file  . Active Member of Clubs or Organizations: Not on file  . Attends Archivist Meetings: Not on file  . Marital Status: Not on file  Intimate Partner Violence:   . Fear of Current or Ex-Partner: Not on file  . Emotionally Abused: Not on file  . Physically Abused: Not on file  . Sexually Abused: Not on file    FAMILY HISTORY: Family History  Problem Relation Age  of Onset  . Diabetes Mother   . Cirrhosis Father   . COPD Sister     ALLERGIES:  has No Known Allergies.  MEDICATIONS:  Current Outpatient Medications  Medication Sig Dispense Refill  . aspirin EC 81 MG tablet Take 1 tablet (81 mg total) by mouth daily. 90 tablet 3  . atorvastatin (LIPITOR) 40 MG tablet Take 1 tablet by mouth once daily 90 tablet 0  . cholecalciferol (VITAMIN D) 1000 units tablet Take 1 tablet (1,000 Units total) by mouth daily. 90 tablet 3  . clopidogrel (PLAVIX) 75 MG tablet Take 1 tablet by mouth once daily 90 tablet 0  . fluticasone (FLONASE) 50 MCG/ACT nasal spray Place 2 sprays into  both nostrils daily. (Patient taking differently: Place 2 sprays into both nostrils daily as needed for allergies. ) 16 g 6  . furosemide (LASIX) 40 MG tablet Take 1 tablet by mouth once daily 90 tablet 0  . lisinopril (ZESTRIL) 20 MG tablet Take 1 tablet by mouth once daily 90 tablet 0  . metoprolol succinate (TOPROL-XL) 50 MG 24 hr tablet TAKE 1 TABLET BY MOUTH ONCE DAILY WITH OR IMMEDIATELY FOLLOWING A MEAL 90 tablet 0  . montelukast (SINGULAIR) 10 MG tablet TAKE 1 TABLET BY MOUTH AT BEDTIME 90 tablet 0  . nitroGLYCERIN (NITROSTAT) 0.4 MG SL tablet DISSOLVE ONE TABLET UNDER THE TONGUE EVERY 5 MINUTES AS NEEDED FOR CHEST PAIN.  DO NOT EXCEED A TOTAL OF 3 DOSES IN 15 MINUTES (Patient taking differently: Place 0.4 mg under the tongue every 5 (five) minutes as needed for chest pain. ) 25 tablet 3  . trolamine salicylate (ASPERCREME) 10 % cream Apply 1 application topically as needed for muscle pain.     No current facility-administered medications for this visit.    REVIEW OF SYSTEMS:   Constitutional: Denies fevers, chills or abnormal night sweats Eyes: Denies blurriness of vision, double vision or watery eyes Ears, nose, mouth, throat, and face: Denies mucositis or sore throat Respiratory: Denies cough, dyspnea or wheezes Cardiovascular: Denies palpitation, chest discomfort or lower  extremity swelling Gastrointestinal:  Denies nausea, heartburn or change in bowel habits Skin: Denies abnormal skin rashes Lymphatics: Denies new lymphadenopathy or easy bruising Neurological:Denies numbness, tingling or new weaknesses Behavioral/Psych: Mood is stable, no new changes  All other systems were reviewed with the patient and are negative.  PHYSICAL EXAMINATION: ECOG PERFORMANCE STATUS: 1 - Symptomatic but completely ambulatory  Vitals:   09/28/19 1355  BP: (!) 154/51  Pulse: 60  Resp: 18  Temp: (!) 97.5 F (36.4 C)  SpO2: 100%   Filed Weights   09/28/19 1355  Weight: 116 lb 12.8 oz (53 kg)    GENERAL:alert, no distress and comfortable SKIN: skin color, texture, turgor are normal, no rashes or significant lesions EYES: normal, conjunctiva are pink and non-injected, sclera clear OROPHARYNX: Poor dentition.  No oropharyngeal masses visualized. NECK: supple, thyroid normal size, non-tender, without nodularity LYMPH: Cluster of lymph nodes palpable in the left supraclavicular fossa.  No other palpable lymph nodes in the neck or axilla or inguinal region. LUNGS: clear to auscultation and percussion with normal breathing effort HEART: regular rate & rhythm and no murmurs and no lower extremity edema ABDOMEN:abdomen soft, non-tender and normal bowel sounds Musculoskeletal:no cyanosis of digits and no clubbing  PSYCH: alert & oriented x 3 with fluent speech NEURO: no focal motor/sensory deficits  LABORATORY DATA:  I have reviewed the data as listed Lab Results  Component Value Date   WBC 8.9 09/12/2019   HGB 15.3 09/12/2019   HCT 46.7 09/12/2019   MCV 87.1 09/12/2019   PLT 264 09/12/2019     Chemistry      Component Value Date/Time   NA 135 09/12/2019 1120   NA 139 10/26/2018 1139   K 4.0 09/12/2019 1120   CL 98 09/12/2019 1120   CO2 27 09/12/2019 1120   BUN 13 09/12/2019 1120   BUN 14 10/26/2018 1139   CREATININE 0.98 09/12/2019 1120      Component  Value Date/Time   CALCIUM 9.4 09/12/2019 1120   ALKPHOS 61 12/30/2016 0853   AST 19 09/12/2019 1120   ALT 14 09/12/2019 1120   BILITOT 0.9 09/12/2019 1120  RADIOGRAPHIC STUDIES: I have personally reviewed the radiological images as listed and agreed with the findings in the report. CT SOFT TISSUE NECK W CONTRAST  Result Date: 09/19/2019 CLINICAL DATA:  Lymphadenopathy EXAM: CT NECK WITH CONTRAST TECHNIQUE: Multidetector CT imaging of the neck was performed using the standard protocol following the bolus administration of intravenous contrast. CONTRAST:  164mL OMNIPAQUE IOHEXOL 300 MG/ML  SOLN COMPARISON:  None. FINDINGS: Pharynx and larynx: Normal. No mass or swelling. Salivary glands: Submandibular and parotid glands are unremarkable. Thyroid: Unremarkable. Lymph nodes: There is a cluster of enlarged left level 5 lymph nodes underlying the skin markers just above the level of the clavicle. The largest measures 1.8 cm. Otherwise no enlarged lymph nodes by size criteria. Vascular: Major neck vessels are patent. There is calcified and noncalcified plaque at the common carotid bifurcations. Limited intracranial: Minimally imaged. Visualized orbits: Unremarkable. Visualized mastoids and paranasal sinuses: Aerated. Skeleton: Advanced multilevel degenerative changes of the cervical spine. Upper chest: Dictated separately. Other: None. IMPRESSION: Cluster of enlarged lymph nodes in the left posterior triangle. Tissue sampling is recommended. These results will be called to the ordering clinician or representative by the Radiologist Assistant, and communication documented in the PACS or zVision Dashboard. Electronically Signed   By: Macy Mis M.D.   On: 09/19/2019 08:53   CT CHEST W CONTRAST  Result Date: 09/19/2019 CLINICAL DATA:  Neck adenopathy EXAM: CT CHEST WITH CONTRAST TECHNIQUE: Multidetector CT imaging of the chest was performed during intravenous contrast administration. CONTRAST:   162mL OMNIPAQUE IOHEXOL 300 MG/ML  SOLN COMPARISON:  02/09/2011 FINDINGS: Cardiovascular: Prior stent graft repair of ascending thoracic aortic aneurysm. Diameter of the distal ascending thoracic aorta/proximal arch just above the stent graft measures 4 cm and is stable since prior study. Aortic and coronary artery calcifications. Heart is mildly enlarged. Mediastinum/Nodes: No mediastinal, hilar or axillary adenopathy. The described left cervical palpable nodules not visible on this chest CT. See neck CT report. Lungs/Pleura: No confluent opacities or effusions. Upper Abdomen: Imaging into the upper abdomen shows no acute findings. Musculoskeletal: Chest wall soft tissues are unremarkable. No acute bony abnormality. IMPRESSION: No evidence of adenopathy in the chest. Prior stent graft repair of ascending thoracic aortic aneurysm. Mild aneurysmal dilatation of the distal ascending aorta, 4 cm, stable since 2012. Coronary artery disease, cardiomegaly. Aortic Atherosclerosis (ICD10-I70.0). Electronically Signed   By: Rolm Baptise M.D.   On: 09/19/2019 08:50    ASSESSMENT & PLAN:  Supraclavicular lymphadenopathy 1.  Left supraclavicular adenopathy: -Patient found to have left supraclavicular adenopathy on routine physical examination 2 weeks ago by Cherly Beach, NP. -He reports that he had these lymph nodes for many years. -He denies any fevers, night sweats or weight loss. -He denies any dysphagia or odynophagia.  He smoked 1 pack/week for 5 to 6 years and quit in 1970s.  He also drank alcohol heavily at that time for 3 to 5 years and quit in the 1970s. -Oropharyngeal exam did not reveal any suspicious masses.  Physical examination shows cluster of lymph nodes in the supraclavicular fossa. -I have reviewed CT of the neck independently dated 09/19/2019 which showed cluster of enlarged level 5 lymph nodes in the supraclavicular fossa.  Largest measures 1.8 cm. -No other oropharyngeal masses visualized.  CT  of the chest did not reveal any adenopathy. -He has an ENT appointment on 10/03/2019 with Dr. Lucia Gaskins in [place]. -I have recommended doing a PET CT scan.  We will also check an LDH level, hepatitis B and  C serology and HIV. -I will see him back after the PET CT scan.  2.  Difficulty urination: -He has urinary symptoms with difficulty initiating the stream.  His PSA is elevated at 5.1. -He also has an appointment to see Dr. Alyson Ingles soon.  Orders Placed This Encounter  Procedures  . NM PET Image Initial (PI) Skull Base To Thigh    Standing Status:   Future    Standing Expiration Date:   09/27/2020    Order Specific Question:   ** REASON FOR EXAM (FREE TEXT)    Answer:   lymphadenopathy of the head and neck    Order Specific Question:   If indicated for the ordered procedure, I authorize the administration of a radiopharmaceutical per Radiology protocol    Answer:   Yes    Order Specific Question:   Preferred imaging location?    Answer:   [facility]    Order Specific Question:   Radiology Contrast Protocol - do NOT remove file path    Answer:   [email or web address]  . Lactate dehydrogenase    Standing Status:   Future    Number of Occurrences:   1    Standing Expiration Date:   09/27/2020  . Hepatitis B surface antigen    Standing Status:   Future    Number of Occurrences:   1    Standing Expiration Date:   09/27/2020  . Hepatitis B surface antibody,qualitative    Standing Status:   Future    Number of Occurrences:   1    Standing Expiration Date:   09/27/2020  . Hepatitis B core antibody, total    Standing Status:   Future    Number of Occurrences:   1    Standing Expiration Date:   09/27/2020  . Hepatitis C Antibody    Standing Status:   Future    Number of Occurrences:   1    Standing Expiration Date:   09/27/2020  . HIV antibody    Standing Status:   Future    Number of Occurrences:   1    Standing Expiration Date:   09/27/2020    All questions  were answered. The patient knows to call the clinic with any problems, questions or concerns.      Derek Jack, MD 09/28/2019 6:54 PM

## 2019-10-04 ENCOUNTER — Ambulatory Visit (INDEPENDENT_AMBULATORY_CARE_PROVIDER_SITE_OTHER): Payer: Medicare Other | Admitting: Otolaryngology

## 2019-10-09 ENCOUNTER — Other Ambulatory Visit (HOSPITAL_COMMUNITY): Payer: Medicare Other

## 2019-10-11 ENCOUNTER — Ambulatory Visit (INDEPENDENT_AMBULATORY_CARE_PROVIDER_SITE_OTHER): Payer: Medicare Other | Admitting: Otolaryngology

## 2019-10-11 ENCOUNTER — Ambulatory Visit: Payer: Medicare Other | Admitting: Urology

## 2019-10-11 ENCOUNTER — Other Ambulatory Visit: Payer: Self-pay

## 2019-10-11 ENCOUNTER — Encounter (INDEPENDENT_AMBULATORY_CARE_PROVIDER_SITE_OTHER): Payer: Self-pay | Admitting: Otolaryngology

## 2019-10-11 VITALS — Temp 97.9°F

## 2019-10-11 DIAGNOSIS — R590 Localized enlarged lymph nodes: Secondary | ICD-10-CM

## 2019-10-11 NOTE — Progress Notes (Signed)
HPI: [name] is a 69 y.o. male who presents is referred by Dr Virgel Bouquet for evaluation of left neck lymphadenopathy. Patient states this is been present for years although it was just recently noticed by his PCP who referred him to oncology for further evaluation. He is scheduled for a PET scan next week. He was referred here for head neck evaluation. I reviewed the CT scan that showed essentially left neck level four lymphadenopathy with multiple nodes. No obvious head neck primary noted on review of the CT scan.. Patient denies any sore throat or trouble swallowing.  No airway problems.  Past Medical History:  Diagnosis Date  . Arterial embolus of lower extremity ([place]) 11/23/2013  . Arthritis   . CAD (coronary artery disease)    a. s/p PCI with 3.5 x 18 BMS to mid RCA, non obst LAD, Lcx patent 08/10/13  . Embolus of femoral artery [facility]) 2012   Surgical repair, previously on Coumadin  . Essential hypertension, benign   . ETOH abuse   . History of DVT (deep vein thrombosis) 2011  . History of GI bleed    February 2012   . 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 Prosthetic valve endocarditis ([place])   . PVD (peripheral vascular disease) ([place])   . S/P aortic valve replacement    Details not clear -  reportedly 1971 per patient   Past Surgical History:  Procedure Laterality Date  . ABDOMINAL AORTOGRAM W/LOWER EXTREMITY N/A 10/31/2018   Procedure: ABDOMINAL AORTOGRAM W/LOWER EXTREMITY;  Surgeon: Lorretta Harp, MD;  Location: [place] [facility];  Service: Cardiovascular;  Laterality: N/A;  . Aoric valve replacement  January 2005   6mm Medtronic Freestyle stentless porcine bioprosthesis [facility])  . CARDIAC CATHETERIZATION     30-40% mid LAD, 20-30% ostial D1, proximal 30-40% RCA, 95% mid RCA thrombus, 40% distal stenosis. EF 45%, inferior HK.  [name] COLONOSCOPY N/A  04/20/2014   Procedure: COLONOSCOPY;  Surgeon: Rogene Houston, MD;  [facility];  Service: Endoscopy;  Laterality: N/A;  930  . CORONARY ANGIOPLASTY WITH STENT PLACEMENT  07/2013  . INGUINAL HERNIA REPAIR Bilateral 04/17/2019   Procedure: ATTEMPTED LAPAROSCOPIC BILATERAL INGUINAL HERNIA REPAIR;  Surgeon: Coralie Keens, MD;  Location: [place];  Service: General;  Laterality: Bilateral;  . INGUINAL HERNIA REPAIR Bilateral 04/17/2019   Procedure: Hernia Repair Inguinal Adult;  Surgeon: Coralie Keens, MD;  Location: [place];  Service: General;  Laterality: Bilateral;  . LEFT HEART CATHETERIZATION WITH CORONARY ANGIOGRAM N/A 08/10/2013   Procedure: LEFT HEART CATHETERIZATION WITH CORONARY ANGIOGRAM;  Surgeon: Blane Ohara, MD;  Location: [facility];  Service: Cardiovascular;  Laterality: N/A;  . Left leg embolectomy  May 2012   Dr. Trula Slade - reason for lifelong coumadin  . PERIPHERAL VASCULAR ATHERECTOMY  10/31/2018   Procedure: PERIPHERAL VASCULAR ATHERECTOMY;  Surgeon: Lorretta Harp, MD;  Location: [place] [facility];  Service: Cardiovascular;;  left SFA   Social History   Socioeconomic History  . Marital status: Single    Spouse name: Not on file  . Number of children: 2  . Years of education: Not on file  . Highest education level: 11th grade  Occupational History  . Occupation: disability  Tobacco Use  . Smoking status: Former Smoker    Packs/day: 0.50    Years: 3.00  Pack years: 1.50    Types: Cigarettes    Start date: 10/12/1965    Quit date: 02/19/1970    Years since quitting: 49.6  . Smokeless tobacco: Never Used  Substance and Sexual Activity  . Alcohol use: Not Currently  . Drug use: Yes    Types: Marijuana    Comment: pt smokes weed every once in while  . Sexual activity: Not Currently  Other Topics Concern  . Not on file  Social History Narrative  . Not on file   Social Determinants of Health   Financial Resource Strain:   . Difficulty of  Paying Living Expenses: Not on file  Food Insecurity:   . Worried About Charity fundraiser in the Last Year: Not on file  . Ran Out of Food in the Last Year: Not on file  Transportation Needs:   . Lack of Transportation (Medical): Not on file  . Lack of Transportation (Non-Medical): Not on file  Physical Activity:   . Days of Exercise per Week: Not on file  . Minutes of Exercise per Session: Not on file  Stress:   . Feeling of Stress : Not on file  Social Connections:   . Frequency of Communication with Friends and Family: Not on file  . Frequency of Social Gatherings with Friends and Family: Not on file  . Attends Religious Services: Not on file  . Active Member of Clubs or Organizations: Not on file  . Attends Archivist Meetings: Not on file  . Marital Status: Not on file   Family History  Problem Relation Age of Onset  . Diabetes Mother   . Cirrhosis Father   . COPD Sister    No Known Allergies Prior to Admission medications   Medication Sig Start Date End Date Taking? Authorizing Provider  aspirin EC 81 MG tablet Take 1 tablet (81 mg total) by mouth daily. 11/01/17  Yes [name], MD  atorvastatin (LIPITOR) 40 MG tablet Take 1 tablet by mouth once daily 08/29/19  Yes [name], MD  cholecalciferol (VITAMIN D) 1000 units tablet Take 1 tablet (1,000 Units total) by mouth daily. 11/01/17  Yes [name], MD  clopidogrel (PLAVIX) 75 MG tablet Take 1 tablet by mouth once daily 08/29/19  Yes [name], MD  fluticasone [facility]) 50 MCG/ACT nasal spray Place 2 sprays into both nostrils daily. Patient taking differently: Place 2 sprays into both nostrils daily as needed for allergies.  03/13/19  Yes [name], MD  furosemide (LASIX) 40 MG tablet Take 1 tablet by mouth once daily 08/29/19  Yes [name], MD  lisinopril (ZESTRIL) 20 MG tablet Take 1 tablet by mouth once daily 08/29/19  Yes [name], MD   metoprolol succinate (TOPROL-XL) 50 MG 24 hr tablet TAKE 1 TABLET BY MOUTH ONCE DAILY WITH OR IMMEDIATELY FOLLOWING A MEAL 08/29/19  Yes [name], MD  montelukast (SINGULAIR) 10 MG tablet TAKE 1 TABLET BY MOUTH AT BEDTIME 08/29/19  Yes [name], MD  nitroGLYCERIN (NITROSTAT) 0.4 MG SL tablet DISSOLVE ONE TABLET UNDER THE TONGUE EVERY 5 MINUTES AS NEEDED FOR CHEST PAIN.  DO NOT EXCEED A TOTAL OF 3 DOSES IN 15 MINUTES Patient taking differently: Place 0.4 mg under the tongue every 5 (five) minutes as needed for chest pain.  09/23/17  Yes Satira Sark, MD  trolamine salicylate (ASPERCREME) 10 % cream Apply 1 application topically as needed for muscle pain.  Yes [provider]     Positive ROS: Otherwise negative  All other systems have been reviewed and were otherwise negative with the exception of those mentioned in the HPI and as above.  Physical Exam: Constitutional: Alert, well-appearing, no acute distress. Ears: External ears without lesions or tenderness. Ear canals are clear bilaterally with intact, clear TMs.  Nasal: External nose without lesions. Septum with mild deformity.. Clear nasal passages.  No signs of infection Oral: Lips and gums without lesions. Tongue and palate mucosa without lesions. Posterior oropharynx clear.  Tonsil regions appear benign bilaterally.  Palpation of the tonsils and base of tongue was soft.  Indirect laryngoscopy revealed clear base of tongue vallecula and epiglottis. Fiberoptic laryngoscopy through the left nostril revealed clear nasopharynx with no mucosal abnormalities.  Base of tongue, vallecula and epiglottis were normal.  AE folds were clear bilaterally piriform sinuses were clear.  Vocal cords were normal with normal normal vocal cord mobility. Neck: Patient has several enlarged firm mobile lymph nodes in the left supraclavicular region just posterior to the attachment of the sternocleidomastoid muscle. Respiratory:  Breathing comfortably.  Lungs clear to auscultation. Cardiac exam: Strong systolic murmur. Skin: No facial/neck lesions or rash noted.  Laryngoscopy  Date/Time: 10/11/2019 1:10 PM Performed by: Rozetta Nunnery, MD Authorized by: Rozetta Nunnery, MD   Consent:    Consent obtained:  Verbal   Consent given by:  Patient   Risks discussed:  Pain Procedure details:    Indications: assessment of airway     Instrument: flexible fiberoptic laryngoscope     Scope location: right nare   Sinus:    Right nasopharynx: normal     Left nasopharynx: normal   Mouth:    Oropharynx: normal     Vallecula: normal     Base of tongue: normal     Epiglottis: normal   Throat:    Right hypopharynx: normal     Left hypopharynx: normal     Pyriform sinus: normal     True vocal cords: normal   Comments:     Normal upper airway examination with no evidence of primary malignancy or neoplasm.    Assessment: Left neck lymphadenopathy Normal upper airway examination with no evidence of primary malignancy.  Plan: Patient is scheduled for a PET scan next Thursday. I will plan on scheduling excisional biopsy of a left lower neck lymph node following the PET scan. Patient will need cardiac clearance and will have to stop the Plavix for 5 days preop.   Radene Journey, MD   CC:

## 2019-10-11 NOTE — Progress Notes (Deleted)
10/11/2019 9:08 AM   [name] [birth year] [id number]  Referring provider: [name], MD [address], [place],  [place] [address]  No chief complaint on file.   HPI:    PMH: Past Medical History:  Diagnosis Date  . Arterial embolus of lower extremity ([place]) 11/23/2013  . Arthritis   . CAD (coronary artery disease)    a. s/p PCI with 3.5 x 18 BMS to mid RCA, non obst LAD, Lcx patent 08/10/13  . Embolus of femoral artery [facility]) 2012   Surgical repair, previously on Coumadin  . Essential hypertension, benign   . ETOH abuse   . History of DVT (deep vein thrombosis) 2011  . History of GI bleed    February 2012   . 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 Prosthetic valve endocarditis ([place])   . PVD (peripheral vascular disease) ([place])   . S/P aortic valve replacement    Details not clear -  reportedly 1971 per patient    Surgical History: Past Surgical History:  Procedure Laterality Date  . ABDOMINAL AORTOGRAM W/LOWER EXTREMITY N/A 10/31/2018   Procedure: ABDOMINAL AORTOGRAM W/LOWER EXTREMITY;  Surgeon: Lorretta Harp, MD;  Location: [place] [facility] LAB;  Service: Cardiovascular;  Laterality: N/A;  . Aoric valve replacement  January 2005   13mm Medtronic Freestyle stentless porcine bioprosthesis [facility])  . CARDIAC CATHETERIZATION     30-40% mid LAD, 20-30% ostial D1, proximal 30-40% RCA, 95% mid RCA thrombus, 40% distal stenosis. EF 45%, inferior HK.  [name] COLONOSCOPY N/A 04/20/2014   Procedure: COLONOSCOPY;  Surgeon: Rogene Houston, MD;  [facility];  Service: Endoscopy;  Laterality: N/A;  930  . CORONARY ANGIOPLASTY WITH STENT PLACEMENT  07/2013  . INGUINAL HERNIA REPAIR Bilateral 04/17/2019   Procedure: ATTEMPTED LAPAROSCOPIC BILATERAL INGUINAL HERNIA REPAIR;  Surgeon: Coralie Keens, MD;  Location: [place];  Service:  General;  Laterality: Bilateral;  . INGUINAL HERNIA REPAIR Bilateral 04/17/2019   Procedure: Hernia Repair Inguinal Adult;  Surgeon: Coralie Keens, MD;  Location: [place];  Service: General;  Laterality: Bilateral;  . LEFT HEART CATHETERIZATION WITH CORONARY ANGIOGRAM N/A 08/10/2013   Procedure: LEFT HEART CATHETERIZATION WITH CORONARY ANGIOGRAM;  Surgeon: Blane Ohara, MD;  [facility] CATH LAB;  Service: Cardiovascular;  Laterality: N/A;  . Left leg embolectomy  May 2012   Dr. Trula Slade - reason for lifelong coumadin  . PERIPHERAL VASCULAR ATHERECTOMY  10/31/2018   Procedure: PERIPHERAL VASCULAR ATHERECTOMY;  Surgeon: Lorretta Harp, MD;  Location: [place] CV LAB;  Service: Cardiovascular;;  left SFA    Home Medications:  Allergies as of 10/11/2019   No Known Allergies     Medication List       Accurate as of October 11, 2019  9:08 AM. If you have any questions, ask your nurse or doctor.        aspirin EC 81 MG tablet Take 1 tablet (81 mg total) by mouth daily.   atorvastatin 40 MG tablet Commonly known as: LIPITOR Take 1 tablet by mouth once daily   cholecalciferol 25 MCG (1000 UNIT) tablet Commonly known as: VITAMIN D Take 1 tablet (1,000 Units total) by mouth daily.   clopidogrel 75 MG tablet Commonly known as: PLAVIX Take 1 tablet by mouth once daily   fluticasone 50 MCG/ACT nasal spray Commonly  known as: FLONASE Place 2 sprays into both nostrils daily. What changed:   when to take this  reasons to take this   furosemide 40 MG tablet Commonly known as: LASIX Take 1 tablet by mouth once daily   lisinopril 20 MG tablet Commonly known as: ZESTRIL Take 1 tablet by mouth once daily   metoprolol succinate 50 MG 24 hr tablet Commonly known as: TOPROL-XL TAKE 1 TABLET BY MOUTH ONCE DAILY WITH OR IMMEDIATELY FOLLOWING A MEAL   montelukast 10 MG tablet Commonly known as: SINGULAIR TAKE 1 TABLET BY MOUTH AT BEDTIME   nitroGLYCERIN 0.4 MG SL  tablet Commonly known as: NITROSTAT DISSOLVE ONE TABLET UNDER THE TONGUE EVERY 5 MINUTES AS NEEDED FOR CHEST PAIN.  DO NOT EXCEED A TOTAL OF 3 DOSES IN 15 MINUTES What changed: See the new instructions.   trolamine salicylate 10 % cream Commonly known as: ASPERCREME Apply 1 application topically as needed for muscle pain.       Allergies: No Known Allergies  Family History: Family History  Problem Relation Age of Onset  . Diabetes Mother   . Cirrhosis Father   . COPD Sister     Social History:  reports that he quit smoking about 49 years ago. His smoking use included cigarettes. He started smoking about 54 years ago. He has a 1.50 pack-year smoking history. He has never used smokeless tobacco. He reports previous alcohol use. He reports current drug use. Drug: Marijuana.  ROS:                                        Physical Exam: There were no vitals taken for this visit.  Constitutional:  Alert and oriented, No acute distress. HEENT: [place] AT, moist mucus membranes.  Trachea midline, no masses. Cardiovascular: No clubbing, cyanosis, or edema. Respiratory: Normal respiratory effort, no increased work of breathing. GI: Abdomen is soft, nontender, nondistended, no abdominal masses GU: No CVA tenderness Lymph: No cervical or inguinal lymphadenopathy. Skin: No rashes, bruises or suspicious lesions. Neurologic: Grossly intact, no focal deficits, moving all 4 extremities. Psychiatric: Normal mood and affect.  Laboratory Data: Lab Results  Component Value Date   WBC 8.9 09/12/2019   HGB 15.3 09/12/2019   HCT 46.7 09/12/2019   MCV 87.1 09/12/2019   PLT 264 09/12/2019    Lab Results  Component Value Date   CREATININE 0.98 09/12/2019    Lab Results  Component Value Date   PSA 5.1 (H) 09/12/2019   PSA 3.8 09/01/2018   PSA 2.4 08/26/2017    No results found for: TESTOSTERONE  No results found for: HGBA1C  Urinalysis    Component Value  Date/Time   COLORURINE YELLOW 02/05/2011 [place] 02/05/2011 1016   LABSPEC 1.020 02/05/2011 1016   PHURINE 5.5 02/05/2011 1016   GLUCOSEU NEGATIVE 02/05/2011 1016   GLUCOSEU 100 (A) 07/08/2010 1902   HGBUR TRACE (A) 02/05/2011 1016   HGBUR small 07/10/2010 1004   BILIRUBINUR NEGATIVE 02/05/2011 1016   KETONESUR TRACE (A) 02/05/2011 1016   PROTEINUR NEGATIVE 02/05/2011 1016   UROBILINOGEN 0.2 02/05/2011 1016   NITRITE NEGATIVE 02/05/2011 [place] 02/05/2011 1016    Lab Results  Component Value Date   BACTERIA RARE 11/06/2010    Pertinent Imaging: *** Results for orders placed during the hospital encounter of 10/27/10  DG Abd 1 View   Narrative *  RADIOLOGY REPORT*  Clinical Data: Abdominal pain.  ABDOMEN - 1 VIEW  Comparison: CT abdomen and pelvis 10/29/2010 and MRI of the abdomen 10/31/2010.  Findings: Embolization coils are seen in the right upper quadrant of the abdomen.  Bowel gas pattern is unremarkable.  No unexpected calcification.  IMPRESSION: No acute finding.  Embolization coils noted.  Original Report Authenticated [id number]   No results found for this or any previous visit. No results found for this or any previous visit. No results found for this or any previous visit. No results found for this or any previous visit. No results found for this or any previous visit. No results found for this or any previous visit. No results found for this or any previous visit.  Assessment & Plan:    There are no diagnoses linked to this encounter.  No follow-ups on file.  Nicolette Bang, MD  [facility] [place]

## 2019-10-12 ENCOUNTER — Ambulatory Visit (HOSPITAL_COMMUNITY): Payer: Medicare Other | Admitting: Hematology

## 2019-10-16 ENCOUNTER — Telehealth (HOSPITAL_COMMUNITY): Payer: Self-pay | Admitting: *Deleted

## 2019-10-16 ENCOUNTER — Ambulatory Visit (INDEPENDENT_AMBULATORY_CARE_PROVIDER_SITE_OTHER): Payer: Self-pay | Admitting: Otolaryngology

## 2019-10-16 DIAGNOSIS — R591 Generalized enlarged lymph nodes: Secondary | ICD-10-CM

## 2019-10-16 NOTE — Telephone Encounter (Signed)
Dr. Mickie Hillier office faxed over paperwork asking how long pt needed to be off Plavix prior to neck biopsy. Francene Finders, NP made aware and stated she would call to the let Dr. Pollie Friar office know how long pt needs to be off this medication.

## 2019-10-16 NOTE — H&P (Signed)
PREOPERATIVE H&P  Chief Complaint: Left neck lymphadenopathy  HPI: [name] is a 69 y.o. male who presents for evaluation of left neck lymphadenopathy that patient has stated that has been there for several years however it was just recently noted by his PCP who obtained a CT scan and referred him to oncology.  On review of the CT scan he has significant left neck lymphadenopathy that is firm and mobile to palpation on clinical exam.  He is scheduled for a PET scan.  Upper airway exam is clinically negative for any primary cancer as his review of the CT scan.  He is taken to the operating room at this time for open left neck biopsy of the the left supraclavicular adenopathy.  Past Medical History:  Diagnosis Date  . Arterial embolus of lower extremity ([place]) 11/23/2013  . Arthritis   . CAD (coronary artery disease)    a. s/p PCI with 3.5 x 18 BMS to mid RCA, non obst LAD, Lcx patent 08/10/13  . Embolus of femoral artery [facility]) 2012   Surgical repair, previously on Coumadin  . Essential hypertension, benign   . ETOH abuse   . History of DVT (deep vein thrombosis) 2011  . History of GI bleed    February 2012   . 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 Prosthetic valve endocarditis ([place])   . PVD (peripheral vascular disease) ([place])   . S/P aortic valve replacement    Details not clear -  reportedly 1971 per patient   Past Surgical History:  Procedure Laterality Date  . ABDOMINAL AORTOGRAM W/LOWER EXTREMITY N/A 10/31/2018   Procedure: ABDOMINAL AORTOGRAM W/LOWER EXTREMITY;  Surgeon: Lorretta Harp, MD;  Location: [place] CV LAB;  Service: Cardiovascular;  Laterality: N/A;  . Aoric valve replacement  January 2005   7mm Medtronic Freestyle stentless porcine bioprosthesis [facility])  . CARDIAC CATHETERIZATION     30-40% mid LAD, 20-30% ostial D1, proximal 30-40% RCA,  95% mid RCA thrombus, 40% distal stenosis. EF 45%, inferior HK.  [name] COLONOSCOPY N/A 04/20/2014   Procedure: COLONOSCOPY;  Surgeon: Rogene Houston, MD;  [facility];  Service: Endoscopy;  Laterality: N/A;  930  . CORONARY ANGIOPLASTY WITH STENT PLACEMENT  07/2013  . INGUINAL HERNIA REPAIR Bilateral 04/17/2019   Procedure: ATTEMPTED LAPAROSCOPIC BILATERAL INGUINAL HERNIA REPAIR;  Surgeon: Coralie Keens, MD;  Location: [place];  Service: General;  Laterality: Bilateral;  . INGUINAL HERNIA REPAIR Bilateral 04/17/2019   Procedure: Hernia Repair Inguinal Adult;  Surgeon: Coralie Keens, MD;  Location: [place];  Service: General;  Laterality: Bilateral;  . LEFT HEART CATHETERIZATION WITH CORONARY ANGIOGRAM N/A 08/10/2013   Procedure: LEFT HEART CATHETERIZATION WITH CORONARY ANGIOGRAM;  Surgeon: Blane Ohara, MD;  Location: [facility] CATH LAB;  Service: Cardiovascular;  Laterality: N/A;  . Left leg embolectomy  May 2012   Dr. Trula Slade - reason for lifelong coumadin  . PERIPHERAL VASCULAR ATHERECTOMY  10/31/2018   Procedure: PERIPHERAL VASCULAR ATHERECTOMY;  Surgeon: Lorretta Harp, MD;  Location: [place] CV LAB;  Service: Cardiovascular;;  left SFA   Social History   Socioeconomic History  . Marital status: Single    Spouse name: Not on file  . Number of children: 2  . Years of education: Not on file  . Highest education level: 11th grade  Occupational History  .  Occupation: disability  Tobacco Use  . Smoking status: Former Smoker    Packs/day: 0.50    Years: 3.00    Pack years: 1.50    Types: Cigarettes    Start date: 10/12/1965    Quit date: 02/19/1970    Years since quitting: 49.6  . Smokeless tobacco: Never Used  Substance and Sexual Activity  . Alcohol use: Not Currently  . Drug use: Yes    Types: Marijuana    Comment: pt smokes weed every once in while  . Sexual activity: Not Currently  Other Topics Concern  . Not on file  Social History Narrative  . Not on file    Social Determinants of Health   Financial Resource Strain:   . Difficulty of Paying Living Expenses: Not on file  Food Insecurity:   . Worried About Charity fundraiser in the Last Year: Not on file  . Ran Out of Food in the Last Year: Not on file  Transportation Needs:   . Lack of Transportation (Medical): Not on file  . Lack of Transportation (Non-Medical): Not on file  Physical Activity:   . Days of Exercise per Week: Not on file  . Minutes of Exercise per Session: Not on file  Stress:   . Feeling of Stress : Not on file  Social Connections:   . Frequency of Communication with Friends and Family: Not on file  . Frequency of Social Gatherings with Friends and Family: Not on file  . Attends Religious Services: Not on file  . Active Member of Clubs or Organizations: Not on file  . Attends Archivist Meetings: Not on file  . Marital Status: Not on file   Family History  Problem Relation Age of Onset  . Diabetes Mother   . Cirrhosis Father   . COPD Sister    No Known Allergies Prior to Admission medications   Medication Sig Start Date End Date Taking? Authorizing Provider  aspirin EC 81 MG tablet Take 1 tablet (81 mg total) by mouth daily. 11/01/17  Yes [name], MD  atorvastatin (LIPITOR) 40 MG tablet Take 1 tablet by mouth once daily Patient taking differently: Take 40 mg by mouth daily.  08/29/19  Yes [name], MD  cholecalciferol (VITAMIN D) 1000 units tablet Take 1 tablet (1,000 Units total) by mouth daily. 11/01/17  Yes [name], MD  clopidogrel (PLAVIX) 75 MG tablet Take 1 tablet by mouth once daily Patient taking differently: Take 75 mg by mouth daily.  08/29/19  Yes [name], MD  fluticasone (FLONASE) 50 MCG/ACT nasal spray Place 2 sprays into both nostrils daily. Patient taking differently: Place 2 sprays into both nostrils daily as needed for allergies.  03/13/19  Yes [name], MD  furosemide (LASIX) 40  MG tablet Take 1 tablet by mouth once daily Patient taking differently: Take 40 mg by mouth daily.  08/29/19  Yes [name], MD  lisinopril (ZESTRIL) 20 MG tablet Take 1 tablet by mouth once daily Patient taking differently: Take 20 mg by mouth daily.  08/29/19  Yes [name], MD  metoprolol succinate (TOPROL-XL) 50 MG 24 hr tablet TAKE 1 TABLET BY MOUTH ONCE DAILY WITH OR IMMEDIATELY FOLLOWING A MEAL Patient taking differently: Take 50 mg by mouth daily.  08/29/19  Yes [name], MD  montelukast (SINGULAIR) 10 MG tablet TAKE 1 TABLET BY MOUTH AT BEDTIME Patient taking differently: Take 10 mg by mouth at bedtime.  08/29/19  Yes [name], MD  nitroGLYCERIN (NITROSTAT) 0.4 MG SL tablet DISSOLVE ONE TABLET UNDER THE TONGUE EVERY 5 MINUTES AS NEEDED FOR CHEST PAIN.  DO NOT EXCEED A TOTAL OF 3 DOSES IN 15 MINUTES Patient taking differently: Place 0.4 mg under the tongue every 5 (five) minutes as needed for chest pain.  09/23/17  Yes Satira Sark, MD  trolamine salicylate (ASPERCREME) 10 % cream Apply 1 application topically as needed for muscle pain.   Yes [provider]     Positive ROS: Otherwise negative  All other systems have been reviewed and were otherwise negative with the exception of those mentioned in the HPI and as above.  Physical Exam: There were no vitals filed for this visit.  General: Alert, no acute distress Oral: Normal oral mucosa and tonsils.  Nasal endoscopy through the left nostril reveals a clear nasopharynx, base of tongue vallecula and epiglottis were normal.  Piriform sinuses were clear.  Vocal cords were normal bilaterally. Nasal: Clear nasal passages Neck: Patient has several easily palpable left supraclavicular lymphadenopathy just behind the left sternocleidomastoid muscle the largest measuring approximately 2 cm in size. Ear: Ear canal is clear with normal appearing TMs Cardiovascular: Regular rate and rhythm, no  murmur.  Respiratory: Clear to auscultation Neurologic: Alert and oriented x 3   Assessment/Plan: LYMPHADENOPATHY Plan for Procedure(s): LYMPH NODE BIOPSY OF NECK   Melony Overly, MD 10/16/2019 6:39 PM

## 2019-10-17 ENCOUNTER — Ambulatory Visit (INDEPENDENT_AMBULATORY_CARE_PROVIDER_SITE_OTHER): Payer: Medicare Other | Admitting: Family Medicine

## 2019-10-17 ENCOUNTER — Encounter: Payer: Self-pay | Admitting: Family Medicine

## 2019-10-17 ENCOUNTER — Encounter (HOSPITAL_COMMUNITY): Payer: Self-pay | Admitting: Otolaryngology

## 2019-10-17 ENCOUNTER — Telehealth (INDEPENDENT_AMBULATORY_CARE_PROVIDER_SITE_OTHER): Payer: Self-pay

## 2019-10-17 ENCOUNTER — Other Ambulatory Visit: Payer: Self-pay

## 2019-10-17 ENCOUNTER — Other Ambulatory Visit (HOSPITAL_COMMUNITY)
Admission: RE | Admit: 2019-10-17 | Discharge: 2019-10-17 | Disposition: A | Discharge status: Home / Self Care | Payer: Medicare Other | Source: Ambulatory Visit | Attending: *Deleted | Admitting: *Deleted

## 2019-10-17 ENCOUNTER — Encounter (HOSPITAL_COMMUNITY): Payer: Self-pay | Admitting: Vascular Surgery

## 2019-10-17 ENCOUNTER — Ambulatory Visit (HOSPITAL_COMMUNITY)
Admission: RE | Admit: 2019-10-17 | Discharge: 2019-10-17 | Disposition: A | Discharge status: Home / Self Care | Payer: Medicare Other | Source: Ambulatory Visit | Attending: Family Medicine | Admitting: Family Medicine

## 2019-10-17 VITALS — BP 130/50 | HR 65 | Temp 97.9°F | Resp 15 | Ht 64.0 in | Wt 113.0 lb

## 2019-10-17 DIAGNOSIS — I10 Essential (primary) hypertension: Secondary | ICD-10-CM

## 2019-10-17 DIAGNOSIS — R309 Painful micturition, unspecified: Secondary | ICD-10-CM | POA: Insufficient documentation

## 2019-10-17 DIAGNOSIS — R319 Hematuria, unspecified: Secondary | ICD-10-CM | POA: Insufficient documentation

## 2019-10-17 DIAGNOSIS — R109 Unspecified abdominal pain: Secondary | ICD-10-CM | POA: Diagnosis not present

## 2019-10-17 DIAGNOSIS — R634 Abnormal weight loss: Secondary | ICD-10-CM

## 2019-10-17 HISTORY — DX: Hematuria, unspecified: R31.9

## 2019-10-17 HISTORY — DX: Painful micturition, unspecified: R30.9

## 2019-10-17 LAB — POCT URINALYSIS DIP (CLINITEK)
Bilirubin, UA: NEGATIVE
Glucose, UA: NEGATIVE mg/dL
Ketones, POC UA: NEGATIVE mg/dL
Nitrite, UA: NEGATIVE
POC PROTEIN,UA: 100 — AB
Spec Grav, UA: 1.02 (ref 1.010–1.025)
Urobilinogen, UA: 0.2 E.U./dL
pH, UA: 7 (ref 5.0–8.0)

## 2019-10-17 NOTE — Patient Instructions (Addendum)
Happy New Year! May you have a year filled with hope, love, happiness and laughter.  I appreciate the opportunity to provide you with care for your health and wellness. Today we discussed: wt, upcoming appts   Follow up: 4 weeks for wt check and follow up post Bx  No labs or referrals today  Xray for kidney stone check  Start drinking 2 ensures or similar supplements daily, in addition to whatever you are eating. Please eat well balanced and full meals 3 times daily, and snack as well.  I will send out your urine for culture-if + we will treat infection.  Please continue to practice social distancing to keep you, your family, and our community safe.  If you must go out, please wear a mask and practice good handwashing.  It was a pleasure to see you and I look forward to continuing to work together on your health and well-being. Please do not hesitate to call the office if you need care or have questions about your care.  Have a wonderful day and week. With Gratitude, Cherly Beach, DNP, AGNP-BC

## 2019-10-17 NOTE — Assessment & Plan Note (Signed)
Pain with urination frequent urination even noted today in the office.  Advised for him to hydrate very well.  We will send out urine for culture.  White blood cells noted in addition to blood.  Possible kidney stone will be getting KUB just to rule out.  Treatment as results are pending.

## 2019-10-17 NOTE — Assessment & Plan Note (Signed)
Have suggested that he has twice daily supplementation with Ensure or something of the unlike.  He is borderline now going into malnutrition.  Continued weight loss is still going to be monitored with his follow-up pending biopsy and urology appointment.  Advised for him to not only do supplementation but to make sure that he is eating 3 meals a day and snacking as much as possible but maintaining a low sodium diet

## 2019-10-17 NOTE — Progress Notes (Addendum)
Anesthesia Chart Review: [name]   Case: [id number] Date/Time: 10/20/19 0700   Procedure: LYMPH NODE BIOPSY OF NECK (N/A )   Anesthesia type: General   Pre-op diagnosis: LYMPHADENOPATHY   Location: [place] OR ROOM 08 / [place] OR   Surgeons: Rozetta Nunnery, MD      DISCUSSION: Patient is a 69 year old male scheduled for the above procedure.  History includes former smoker (quit 02/19/1970), aortic valve disease (severe AI with secondary cardiomyopathy s/p bioprosthetic AVR 10/02/03 [facility]; history of prosthetic valve endocarditis but otherwise no know details; s/p semiurgent TAVR at [facility] 10/20/17 for new/acute decompensated heart failure with severe AI and LV dysfunction/EF 20-25% due in part to torn cusp of previously placed bioprosthetic AVR), CAD (NSTEMI, s/p PCI with BMS to RCA 08/10/2013), right CFV DVT (04/17/10), HTN, PAD (left SFA embolectomy 02/06/11; s/p atherectomy and drug-coated balloon angioplasty left SFA 10/31/2018), 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  Seen by Cherly Beach, NP with primary care 10/17/19 for follow-up elevated PSA, weight loss, and hematuria. He thinks he may have passed a kidney stone on 10/08/19. Urine culture and KUB ordered. Urine culture showed insignificant growth and KUB showed not definite kidney stone. Notes indicate that he is working on getting established with urology. She is aware of surgery plans.   Last cardiology evaluation with Dr. Domenic Polite on 08/03/19. LVEF stable at 40-45% 12/2018. He did have some perivalvular AR. Follow-up echo planned around his six month follow-up visit.   Notes in Epic indicate that Dr. Hassell Done reached out to [facility] on 10/16/19 regarding pre-operative ASA/Plavix instructions. Patient reported being instructed to take last dose ASA  and Plavix on 10/17/19.  Reportedly, he is for same day presurgical COVID-19 testing.  Anesthesia team to evaluate on the day of surgery   VS:  BP Readings from Last 3 Encounters:  10/17/19 (!) 130/50  09/28/19 (!) 154/51  09/12/19 (!) 136/48   Pulse Readings from Last 3 Encounters:  10/17/19 65  09/28/19 60  09/12/19 (!) 51    PROVIDERS: [name], MD is PCP. Last visit with Cherly Beach, NP on 10/17/19.  Rozann Lesches, MD is cardiologist. Last evaluation 08/03/19. Six month follow-up recommended with echo prior to visit.  Derek Jack, MD is HEM-ONC. Last visit 09/28/19.  Nicolette Bang, MD is to be urologist (according to notes)    LABS: He is a same day work-up, so he will get updated labs on arrival. Currently, latest lab results include: Lab Results  Component Value Date   WBC 8.9 09/12/2019   HGB 15.3 09/12/2019   HCT 46.7 09/12/2019   PLT 264 09/12/2019   GLUCOSE 87 09/12/2019   ALT 14 09/12/2019   AST 19 09/12/2019   NA 135 09/12/2019   K 4.0 09/12/2019   CL 98 09/12/2019   CREATININE 0.98 09/12/2019   BUN 13 09/12/2019   CO2 27 09/12/2019   TSH 0.45 09/12/2019   PSA 5.1 (H) 09/12/2019   INR 1.0 04/13/2019   Non-reactive HIV, Hep B and Hep C antibody testing 09/28/19.    IMAGES: Xray abd 1V 10/17/19: IMPRESSION: 1. No definite radiopaque urinary tract calculi identified. 2. Mild gaseous dilatation of multiple small bowel loops, nonspecific though may reflect mild ileus.  CT chest 09/19/19: IMPRESSION: - No evidence of adenopathy in the chest. - Prior  stent graft repair of ascending thoracic aortic aneurysm. Mild aneurysmal dilatation of the distal ascending aorta, 4 cm, stable since 2012. - Coronary artery disease, cardiomegaly. - Aortic Atherosclerosis (ICD10-I70.0).  CT soft tissue neck 09/19/19: IMPRESSION: Cluster of enlarged lymph nodes in the left posterior triangle. Tissue sampling is recommended.   EKG:  12/06/18 ([facility]): Sinus bradycardia with first-degree AV block @ 52 bpm Left axis deviation ST and T wave abnormality, consider lateral ischemia   CV: TTE 12/29/18: Impressions: 1. The left ventricle has mildly reduced systolic function, with an ejection fraction of 45-50%. The cavity size was moderately dilated. There is mildly increased left ventricular wall thickness. Left ventricular diastolic Doppler parameters are  consistent with impaired relaxation.  2. The right ventricle has normal systolic function. The cavity was normal. There is no increase in right ventricular wall thickness.  3. Left atrial size was moderately dilated.  4. The tricuspid valve is grossly normal.  5. Post bioprosthetic AVR with valve in valve TAVR using 26 mm Sapien 3 Small perivalvular leak on septal side Valve appears to have been placed deep in ventrical Also some ? central AR. The peak gradient has increased from 15 mmHg to 21 mmHg since  12/2017 Also some unusual color flow in subcostal views in LVOT and 3 chamber view shows some diastolic flow out by posterior wall so may be underestimating degree of AR Can consider TEE if clinicaly indicated after COVID 19 restrictions lifted.  6. The interatrial septum was not well visualized.)  (Question of at least mild AR. Echo prior to ~ 01/2020 visit recommended by cardiologist Dr. Domenic Polite. Appointments not scheduled yet. Comparison LVEF 40-45% 12/27/17 echo; 25% 10/21/17 echo; 45% CCTA; 20-25% 09/29/17)  Cardiac cath 10/01/17 (pre-TAVR, [place]): - Cors only pre AVR.Mild to moderate non-obstructive CAD. Outlined in 10/02/17 discharge summary as: Left heart catheterization (10/01/17) Left Main  Ost LM lesion, 20% stenosed.  LM lesion, 30% stenosed. distal  Left Anterior Descending  The vessel exhibits minimal luminal irregularities (<10%).  Left Circumflex  Prox Cx lesion, 40% stenosed. small vessel  Right Coronary Artery  Prox RCA lesion, 30% stenosed.  Mid  RCA lesion, 30% stenosed. The lesion was previously treated using a stent (unknown type).  Right Posterior Descending Artery  The vessel exhibits minimal luminal irregularities (<10%).     Past Medical History:  Diagnosis Date  . Arterial embolus of lower extremity ([place]) 11/23/2013  . Arthritis   . CAD (coronary artery disease)    a. s/p PCI with 3.5 x 18 BMS to mid RCA, non obst LAD, Lcx patent 08/10/13  . Embolus of femoral artery [facility]) 2012   Surgical repair, previously on Coumadin  . Essential hypertension, benign   . ETOH abuse   . History of DVT (deep vein thrombosis) 2011  . History of GI bleed    February 2012   . Ingrown left greater toenail 09/04/2018  . 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 Prosthetic valve endocarditis ([place])   . PVD (peripheral vascular disease) ([place])   . S/P aortic valve replacement    Details not clear -  reportedly 1971 per patient    Past Surgical History:  Procedure Laterality Date  . ABDOMINAL AORTOGRAM W/LOWER EXTREMITY N/A 10/31/2018   Procedure: ABDOMINAL AORTOGRAM W/LOWER EXTREMITY;  Surgeon: Lorretta Harp, MD;  Location: [place] CV LAB;  Service: Cardiovascular;  Laterality: N/A;  . Aoric valve replacement  January 2005   98mm Medtronic Freestyle stentless porcine bioprosthesis [facility])  . CARDIAC CATHETERIZATION     30-40% mid LAD, 20-30% ostial D1, proximal 30-40% RCA, 95% mid RCA thrombus, 40% distal stenosis. EF 45%, inferior HK.  [name] COLONOSCOPY N/A 04/20/2014   Procedure: COLONOSCOPY;  Surgeon: Rogene Houston, MD;  [facility];  Service: Endoscopy;  Laterality: N/A;  930  . CORONARY ANGIOPLASTY WITH STENT PLACEMENT  07/2013  . INGUINAL HERNIA REPAIR Bilateral 04/17/2019   Procedure: ATTEMPTED LAPAROSCOPIC BILATERAL INGUINAL HERNIA REPAIR;  Surgeon: Coralie Keens, MD;  Location: [place];  Service:  General;  Laterality: Bilateral;  . INGUINAL HERNIA REPAIR Bilateral 04/17/2019   Procedure: Hernia Repair Inguinal Adult;  Surgeon: Coralie Keens, MD;  Location: [place];  Service: General;  Laterality: Bilateral;  . LEFT HEART CATHETERIZATION WITH CORONARY ANGIOGRAM N/A 08/10/2013   Procedure: LEFT HEART CATHETERIZATION WITH CORONARY ANGIOGRAM;  Surgeon: Blane Ohara, MD;  Location: [facility] CATH LAB;  Service: Cardiovascular;  Laterality: N/A;  . Left leg embolectomy  May 2012   Dr. Trula Slade - reason for lifelong coumadin  . PERIPHERAL VASCULAR ATHERECTOMY  10/31/2018   Procedure: PERIPHERAL VASCULAR ATHERECTOMY;  Surgeon: Lorretta Harp, MD;  Location: [place] CV LAB;  Service: Cardiovascular;;  left SFA    MEDICATIONS: No current facility-administered medications for this encounter.   [name] aspirin EC 81 MG tablet  . atorvastatin (LIPITOR) 40 MG tablet  . cholecalciferol (VITAMIN D) 1000 units tablet  . clopidogrel (PLAVIX) 75 MG tablet  . fluticasone (FLONASE) 50 MCG/ACT nasal spray  . furosemide (LASIX) 40 MG tablet  . lisinopril (ZESTRIL) 20 MG tablet  . metoprolol succinate (TOPROL-XL) 50 MG 24 hr tablet  . montelukast (SINGULAIR) 10 MG tablet  . nitroGLYCERIN (NITROSTAT) 0.4 MG SL tablet  . trolamine salicylate (ASPERCREME) 10 % cream    Myra Gianotti, PA-C Surgical Short Stay/Anesthesiology [facility] Phone [phone] [facility] Phone [phone] 10/18/2019 1:35 PM

## 2019-10-17 NOTE — Assessment & Plan Note (Signed)
Present on dip today.  We will be sending out for KUB and culture.  If kidney stone still present will refer to urology.  If culture is positive for bacteria will be providing treatment.  Advised for him to continue hydration very well.  Avoid when he feels the need to void can try some cranberry if needed.

## 2019-10-17 NOTE — Assessment & Plan Note (Signed)
[name] is encouraged to maintain a well balanced diet that is low in salt. Controlled, continue current medication regimen. Additionally, he is also reminded that exercise is beneficial for heart health and control of  Blood pressure. 30-60 minutes daily is recommended-walking was suggested.  Possible reduction in BP is due to weight loss.  Will need to keep close eye.

## 2019-10-17 NOTE — Progress Notes (Signed)
Subjective:  Patient ID: [name], male    DOB: 1951/01/31  Age: 69 y.o. MRN: [id number]  CC:  Chief Complaint  Patient presents with  . elevated PSA    follow up  . weight loss      HPI  HPI  [name] is a 69 year old male patient who I saw him about a month ago for follow-up for his CPE.  Was diagnosed with having lymphadenopathy at that time has been doing follow-up with ENT and oncology to help establish a cause.  Did have an elevated PSA and is still working on getting into a urologist.  Has biopsy planned for lymph node on January 29 this Friday.  He reports he is to get a Covid test is unsure if he supposed to get it before or after the biopsy he says that he has had a little bit of confusion regarding that.  I have advised him to get the Covid test if he needs to have [place] refer him for we will.  Additionally at that appointment he had lost some weight and he presents today with having another 2 pound weight loss which puts him borderline of having a low BMI if he continues to do this with underweight/malnutrition.  He reports that he eats hearty and well.  He is willing to add supplementation to help with this though.  Blood pressure has improved at this visit 130/50.  Reports taking all his medications without any issue.  Around that he does not really monitor his diet but overall may be just the weight loss has improved his blood pressure he thinks.  Today his main complaint is pain with urination.  He reports that on Sunday the 17th he passed a kidney stone he believes this had passed from before and he does have vascularization feels.  He reports that he had gross amount of blood that he noted in his urine.  Did not go to the emergency room or call us at that time.  He reports that his blood has been present in urine but appears to be getting lighter but then he gets darker.  Reports pain is better some days and then worse Sunday.  He reports still having a little  bit of burning with urination.  Increased urgency and frequency as well.  He denies having any back pain or flank pain.  Denies having any nausea vomiting.  Reports some hot flashes but no fevers or chills.   Today patient denies signs and symptoms of COVID 19 infection including fever, chills, cough, shortness of breath, and headache. Past Medical, Surgical, Social History, Allergies, and Medications have been Reviewed.   Past Medical History:  Diagnosis Date  . Arterial embolus of lower extremity ([place]) 11/23/2013  . Arthritis   . CAD (coronary artery disease)    a. s/p PCI with 3.5 x 18 BMS to mid RCA, non obst LAD, Lcx patent 08/10/13  . Embolus of femoral artery [facility]) 2012   Surgical repair, previously on Coumadin  . Essential hypertension, benign   . ETOH abuse   . History of DVT (deep vein thrombosis) 2011  . History of GI bleed    February 2012   . 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 Prosthetic valve endocarditis ([place])   . PVD (peripheral vascular disease) ([place])   . S/P  aortic valve replacement    Details not clear -  reportedly 1971 per patient    Current Meds  Medication Sig  . aspirin EC 81 MG tablet Take 1 tablet (81 mg total) by mouth daily.  [name] atorvastatin (LIPITOR) 40 MG tablet Take 1 tablet by mouth once daily (Patient taking differently: Take 40 mg by mouth daily. )  . cholecalciferol (VITAMIN D) 1000 units tablet Take 1 tablet (1,000 Units total) by mouth daily.  . clopidogrel (PLAVIX) 75 MG tablet Take 1 tablet by mouth once daily (Patient taking differently: Take 75 mg by mouth daily. )  . fluticasone (FLONASE) 50 MCG/ACT nasal spray Place 2 sprays into both nostrils daily. (Patient taking differently: Place 2 sprays into both nostrils daily as needed for allergies. )  . furosemide (LASIX) 40 MG tablet Take 1 tablet by mouth once daily (Patient  taking differently: Take 40 mg by mouth daily. )  . lisinopril (ZESTRIL) 20 MG tablet Take 1 tablet by mouth once daily (Patient taking differently: Take 20 mg by mouth daily. )  . metoprolol succinate (TOPROL-XL) 50 MG 24 hr tablet TAKE 1 TABLET BY MOUTH ONCE DAILY WITH OR IMMEDIATELY FOLLOWING A MEAL (Patient taking differently: Take 50 mg by mouth daily. )  . montelukast (SINGULAIR) 10 MG tablet TAKE 1 TABLET BY MOUTH AT BEDTIME (Patient taking differently: Take 10 mg by mouth at bedtime. )  . nitroGLYCERIN (NITROSTAT) 0.4 MG SL tablet DISSOLVE ONE TABLET UNDER THE TONGUE EVERY 5 MINUTES AS NEEDED FOR CHEST PAIN.  DO NOT EXCEED A TOTAL OF 3 DOSES IN 15 MINUTES (Patient taking differently: Place 0.4 mg under the tongue every 5 (five) minutes as needed for chest pain. )  . trolamine salicylate (ASPERCREME) 10 % cream Apply 1 application topically as needed for muscle pain.    ROS:  Review of Systems  Constitutional: Positive for weight loss. Negative for chills and fever.  HENT: Negative.   Eyes: Negative.   Respiratory: Negative.   Cardiovascular: Negative.   Gastrointestinal: Negative.   Genitourinary: Positive for dysuria, frequency, hematuria and urgency. Negative for flank pain.  Musculoskeletal: Negative.   Skin: Negative.   Neurological: Negative.   Endo/Heme/Allergies: Negative.   Psychiatric/Behavioral: Negative.   All other systems reviewed and are negative.    Objective:   Today's Vitals: BP (!) 130/50   Pulse 65   Temp 97.9 F (36.6 C) (Temporal)   Resp 15   Ht 5\' 4"  (1.626 m)   Wt 113 lb 0.6 oz (51.3 kg)   SpO2 100%   BMI 19.40 kg/m  Vitals with BMI 10/17/2019 09/28/2019 09/12/2019  Height 5\' 4"  5\' 4"  5\' 4"   Weight 113 lbs 1 oz 116 lbs 13 oz 115 lbs 13 oz  BMI 19.39 [id number]  Systolic [id number]  Diastolic 50 51 48  Pulse 65 60 51     Physical Exam Vitals and nursing note reviewed.  Constitutional:      Appearance: Normal appearance. He is  well-developed, well-groomed and normal weight.  HENT:     Head: Normocephalic and atraumatic.     Comments: Mask in place    Right Ear: External ear normal.     Left Ear: External ear normal.  Eyes:     General:        Right eye: No discharge.        Left eye: No discharge.     Conjunctiva/sclera: Conjunctivae normal.  Cardiovascular:     Rate and  Rhythm: Normal rate and regular rhythm.     Pulses: Normal pulses.     Heart sounds: Normal heart sounds.  Pulmonary:     Effort: Pulmonary effort is normal.     Breath sounds: Normal breath sounds.  Abdominal:     Tenderness: There is no right CVA tenderness or left CVA tenderness.  Musculoskeletal:        General: Normal range of motion.     Cervical back: Normal range of motion and neck supple.  Lymphadenopathy:     Comments: Still noted to have lymphadenopathy on the left cervical chains.   Skin:    General: Skin is warm.  Neurological:     General: No focal deficit present.     Mental Status: He is alert and oriented to person, place, and time.  Psychiatric:        Attention and Perception: Attention normal.        Mood and Affect: Mood normal.        Speech: Speech normal.        Behavior: Behavior normal. Behavior is cooperative.        Thought Content: Thought content normal.        Cognition and Memory: Cognition normal.        Judgment: Judgment normal.    Assessment   1. Hematuria, unspecified type   2. Pain with urination   3. Weight loss, non-intentional     Tests ordered Orders Placed This Encounter  Procedures  . Urine Culture  . DG Abd 1 View  . POCT URINALYSIS DIP (CLINITEK)    Plan: Please see assessment and plan per problem list above.   No orders of the defined types were placed in this encounter.   Patient to follow-up in 11/14/2019   Perlie Mayo, NP

## 2019-10-18 LAB — URINE CULTURE: Culture: <10000 — AB

## 2019-10-18 NOTE — Anesthesia Preprocedure Evaluation (Deleted)
Anesthesia Evaluation °Anesthesia Physical °Anesthesia Plan ° °ASA:  ° °Anesthesia Plan:   ° °Post-op Pain Management:   ° °Induction:  ° °PONV Risk Score and Plan:  ° °Airway Management Planned:  ° °Additional Equipment:  ° °Intra-op Plan:  ° °Post-operative Plan:  ° °Informed Consent:  ° °Plan Discussed with:  ° °Anesthesia Plan Comments: (PAT note written by Bayne Fosnaugh, PA-C. °)  ° ° ° ° ° ° °Anesthesia Quick Evaluation ° °

## 2019-10-19 ENCOUNTER — Ambulatory Visit (HOSPITAL_COMMUNITY): Admission: RE | Admit: 2019-10-19 | Payer: Medicare Other | Source: Ambulatory Visit

## 2019-10-20 ENCOUNTER — Ambulatory Visit (HOSPITAL_COMMUNITY): Admission: RE | Admit: 2019-10-20 | Payer: Medicare Other | Source: Home / Self Care | Admitting: Otolaryngology

## 2019-10-20 ENCOUNTER — Other Ambulatory Visit: Payer: Self-pay | Admitting: Urology

## 2019-10-20 DIAGNOSIS — C678 Malignant neoplasm of overlapping sites of bladder: Secondary | ICD-10-CM

## 2019-10-20 DIAGNOSIS — R310 Gross hematuria: Secondary | ICD-10-CM | POA: Diagnosis not present

## 2019-10-20 SURGERY — LYMPH NODE BIOPSY
Anesthesia: General

## 2019-10-20 NOTE — Progress Notes (Signed)
Patient states that the office canceled his procedure because he had no one to stay with him after the procedure and that he would be rescheduled for some time in February.  OR desk and charge CRNA aware.  Dr. Lucia Gaskins notified that patient will not be coming for surgery today.

## 2019-10-23 ENCOUNTER — Ambulatory Visit (HOSPITAL_COMMUNITY): Payer: Medicare Other | Admitting: Hematology

## 2019-10-25 ENCOUNTER — Other Ambulatory Visit: Payer: Self-pay

## 2019-10-30 ENCOUNTER — Other Ambulatory Visit: Payer: Self-pay

## 2019-10-30 ENCOUNTER — Ambulatory Visit (HOSPITAL_COMMUNITY)
Admission: RE | Admit: 2019-10-30 | Discharge: 2019-10-30 | Disposition: A | Discharge status: Home / Self Care | Payer: Medicare Other | Source: Ambulatory Visit | Attending: Hematology | Admitting: Hematology

## 2019-10-30 DIAGNOSIS — R591 Generalized enlarged lymph nodes: Secondary | ICD-10-CM

## 2019-10-30 DIAGNOSIS — R590 Localized enlarged lymph nodes: Secondary | ICD-10-CM | POA: Diagnosis not present

## 2019-10-30 DIAGNOSIS — N135 Crossing vessel and stricture of ureter without hydronephrosis: Secondary | ICD-10-CM | POA: Diagnosis not present

## 2019-10-30 LAB — GLUCOSE, CAPILLARY: Glucose-Capillary: 92 mg/dL (ref 70–99)

## 2019-10-30 MED ORDER — FLUDEOXYGLUCOSE F - 18 (FDG) INJECTION
5.5000 | Freq: Once | INTRAVENOUS | Status: AC | PRN
  Administered 2019-10-30: 07:00:00 5.5 via INTRAVENOUS

## 2019-11-03 ENCOUNTER — Encounter (HOSPITAL_COMMUNITY): Payer: Self-pay | Admitting: *Deleted

## 2019-11-03 NOTE — Progress Notes (Signed)
I talked with patient today and he advised that the reason that he cancelled his appointment was because he did not have anyone to stay with him.  I advised him that he only needs someone to take him home because he will have had sedation.  He states that he can have someone take him and bring him home. I advised him to call Dr. Pollie Friar office.    I called and spoke with Olin Hauser at Dr. Pollie Friar office and advised her that patient is ready to reschedule surgery. She got his updated phone numbers and will call him today.

## 2019-11-06 ENCOUNTER — Telehealth (INDEPENDENT_AMBULATORY_CARE_PROVIDER_SITE_OTHER): Payer: Self-pay

## 2019-11-06 ENCOUNTER — Encounter (HOSPITAL_COMMUNITY): Payer: Self-pay | Admitting: Hematology

## 2019-11-06 ENCOUNTER — Inpatient Hospital Stay (HOSPITAL_COMMUNITY): Payer: Medicare Other | Attending: Hematology | Admitting: Hematology

## 2019-11-06 ENCOUNTER — Other Ambulatory Visit: Payer: Self-pay

## 2019-11-06 DIAGNOSIS — Z87891 Personal history of nicotine dependence: Secondary | ICD-10-CM | POA: Insufficient documentation

## 2019-11-06 DIAGNOSIS — Z833 Family history of diabetes mellitus: Secondary | ICD-10-CM | POA: Diagnosis not present

## 2019-11-06 DIAGNOSIS — Z7982 Long term (current) use of aspirin: Secondary | ICD-10-CM | POA: Diagnosis not present

## 2019-11-06 DIAGNOSIS — Z79899 Other long term (current) drug therapy: Secondary | ICD-10-CM | POA: Diagnosis not present

## 2019-11-06 DIAGNOSIS — R590 Localized enlarged lymph nodes: Secondary | ICD-10-CM

## 2019-11-06 DIAGNOSIS — Z7901 Long term (current) use of anticoagulants: Secondary | ICD-10-CM | POA: Insufficient documentation

## 2019-11-06 DIAGNOSIS — R634 Abnormal weight loss: Secondary | ICD-10-CM | POA: Diagnosis not present

## 2019-11-06 DIAGNOSIS — R319 Hematuria, unspecified: Secondary | ICD-10-CM | POA: Diagnosis not present

## 2019-11-06 DIAGNOSIS — Z86718 Personal history of other venous thrombosis and embolism: Secondary | ICD-10-CM | POA: Insufficient documentation

## 2019-11-06 DIAGNOSIS — I10 Essential (primary) hypertension: Secondary | ICD-10-CM | POA: Diagnosis not present

## 2019-11-06 NOTE — Progress Notes (Signed)
[name], [place] [address]   CLINIC:  Medical Oncology/Hematology  PCP:  [name], MD [address] [place] [address] [phone]   REASON FOR VISIT:  Follow-up for left supraclavicular adenopathy.  CURRENT THERAPY: Under work-up.    INTERVAL HISTORY:  [name] 69 y.o. male seen for follow-up of left supraclavicular adenopathy.  His biopsy with Dr. Lucia Gaskins was reportedly canceled as he could not have transportation back.  Reports appetite and energy levels of 100%.  Reported weight loss of 11 pounds in the last 2 months.  He was reportedly seen by Dr. Alyson Ingles and a cystoscopy was done.  He denies any recent hematuria although he had within the past month.    REVIEW OF SYSTEMS:  Review of Systems  All other systems reviewed and are negative.    PAST MEDICAL/SURGICAL HISTORY:  Past Medical History:  Diagnosis Date  . Arterial embolus of lower extremity ([place]) 11/23/2013  . Arthritis   . CAD (coronary artery disease)    a. s/p PCI with 3.5 x 18 BMS to mid RCA, non obst LAD, Lcx patent 08/10/13  . Embolus of femoral artery [facility]) 2012   Surgical repair, previously on Coumadin  . Essential hypertension, benign   . ETOH abuse   . History of DVT (deep vein thrombosis) 2011  . History of GI bleed    February 2012   . Ingrown left greater toenail 09/04/2018  . 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 Prosthetic valve endocarditis ([place])   . PVD (peripheral vascular disease) ([place])   . S/P aortic valve replacement    Details not clear -  reportedly 1971 per patient   Past Surgical History:  Procedure Laterality Date  . ABDOMINAL AORTOGRAM W/LOWER EXTREMITY N/A 10/31/2018   Procedure: ABDOMINAL AORTOGRAM W/LOWER EXTREMITY;  Surgeon: Lorretta Harp, MD;  Location: [place] [facility];  Service:  Cardiovascular;  Laterality: N/A;  . Aoric valve replacement  January 2005   94mm Medtronic Freestyle stentless porcine bioprosthesis [facility])  . CARDIAC CATHETERIZATION     30-40% mid LAD, 20-30% ostial D1, proximal 30-40% RCA, 95% mid RCA thrombus, 40% distal stenosis. EF 45%, inferior HK.  [name] COLONOSCOPY N/A 04/20/2014   Procedure: COLONOSCOPY;  Surgeon: Rogene Houston, MD;  [facility];  Service: Endoscopy;  Laterality: N/A;  930  . CORONARY ANGIOPLASTY WITH STENT PLACEMENT  07/2013  . INGUINAL HERNIA REPAIR Bilateral 04/17/2019   Procedure: ATTEMPTED LAPAROSCOPIC BILATERAL INGUINAL HERNIA REPAIR;  Surgeon: Coralie Keens, MD;  Location: [place];  Service: General;  Laterality: Bilateral;  . INGUINAL HERNIA REPAIR Bilateral 04/17/2019   Procedure: Hernia Repair Inguinal Adult;  Surgeon: Coralie Keens, MD;  Location: [place];  Service: General;  Laterality: Bilateral;  . LEFT HEART CATHETERIZATION WITH CORONARY ANGIOGRAM N/A 08/10/2013   Procedure: LEFT HEART CATHETERIZATION WITH CORONARY ANGIOGRAM;  Surgeon: Blane Ohara, MD;  Location: [facility] C[facility] LAB;  Service: Cardiovascular;  Laterality: N/A;  . Left leg embolectomy  May 2012   Dr. Trula Slade - reason for lifelong coumadin  . PERIPHERAL VASCULAR ATHERECTOMY  10/31/2018   Procedure: PERIPHERAL VASCULAR ATHERECTOMY;  Surgeon: Lorretta Harp, MD;  Location: [place] [facility] LAB;  Service: Cardiovascular;;  left SFA     SOCIAL HISTORY:  Social History   Socioeconomic  History  . Marital status: Single    Spouse name: Not on file  . Number of children: 2  . Years of education: Not on file  . Highest education level: 11th grade  Occupational History  . Occupation: disability  Tobacco Use  . Smoking status: Former Smoker    Packs/day: 0.50    Years: 3.00    Pack years: 1.50    Types: Cigarettes    Start date: 10/12/1965    Quit date: 02/19/1970    Years since quitting: 49.7  . Smokeless tobacco: Never Used  Substance and  Sexual Activity  . Alcohol use: Not Currently  . Drug use: Yes    Types: Marijuana    Comment: pt smokes weed every once in while  . Sexual activity: Not Currently  Other Topics Concern  . Not on file  Social History Narrative  . Not on file   Social Determinants of Health   Financial Resource Strain:   . Difficulty of Paying Living Expenses: Not on file  Food Insecurity:   . Worried About Charity fundraiser in the Last Year: Not on file  . Ran Out of Food in the Last Year: Not on file  Transportation Needs:   . Lack of Transportation (Medical): Not on file  . Lack of Transportation (Non-Medical): Not on file  Physical Activity:   . Days of Exercise per Week: Not on file  . Minutes of Exercise per Session: Not on file  Stress:   . Feeling of Stress : Not on file  Social Connections:   . Frequency of Communication with Friends and Family: Not on file  . Frequency of Social Gatherings with Friends and Family: Not on file  . Attends Religious Services: Not on file  . Active Member of Clubs or Organizations: Not on file  . Attends Archivist Meetings: Not on file  . Marital Status: Not on file  Intimate Partner Violence:   . Fear of Current or Ex-Partner: Not on file  . Emotionally Abused: Not on file  . Physically Abused: Not on file  . Sexually Abused: Not on file    FAMILY HISTORY:  Family History  Problem Relation Age of Onset  . Diabetes Mother   . Cirrhosis Father   . COPD Sister     CURRENT MEDICATIONS:  Outpatient Encounter Medications as of 11/06/2019  Medication Sig  . aspirin EC 81 MG tablet Take 1 tablet (81 mg total) by mouth daily.  [name] atorvastatin (LIPITOR) 40 MG tablet Take 1 tablet by mouth once daily (Patient taking differently: Take 40 mg by mouth daily. )  . cholecalciferol (VITAMIN D) 1000 units tablet Take 1 tablet (1,000 Units total) by mouth daily.  . clopidogrel (PLAVIX) 75 MG tablet Take 1 tablet by mouth once daily (Patient taking  differently: Take 75 mg by mouth daily. )  . furosemide (LASIX) 40 MG tablet Take 1 tablet by mouth once daily (Patient taking differently: Take 40 mg by mouth daily. )  . lisinopril (ZESTRIL) 20 MG tablet Take 1 tablet by mouth once daily (Patient taking differently: Take 20 mg by mouth daily. )  . metoprolol succinate (TOPROL-XL) 50 MG 24 hr tablet TAKE 1 TABLET BY MOUTH ONCE DAILY WITH OR IMMEDIATELY FOLLOWING A MEAL (Patient taking differently: Take 50 mg by mouth daily. )  . montelukast (SINGULAIR) 10 MG tablet TAKE 1 TABLET BY MOUTH AT BEDTIME (Patient taking differently: Take 10 mg by mouth at bedtime. )  .  fluticasone (FLONASE) 50 MCG/ACT nasal spray Place 2 sprays into both nostrils daily. (Patient not taking: Reported on 11/06/2019)  . nitroGLYCERIN (NITROSTAT) 0.4 MG SL tablet DISSOLVE ONE TABLET UNDER THE TONGUE EVERY 5 MINUTES AS NEEDED FOR CHEST PAIN.  DO NOT EXCEED A TOTAL OF 3 DOSES IN 15 MINUTES (Patient not taking: No sig reported)  . trolamine salicylate (ASPERCREME) 10 % cream Apply 1 application topically as needed for muscle pain.   No facility-administered encounter medications on file as of 11/06/2019.    ALLERGIES:  No Known Allergies   PHYSICAL EXAM:  ECOG Performance status: 1  Vitals:   11/06/19 1107  BP: 93/69  Pulse: (!) 56  Resp: 18  Temp: (!) 97.1 F (36.2 C)  SpO2: 100%   Filed Weights   11/06/19 1107  Weight: 114 lb 3.2 oz (51.8 kg)    Physical Exam Vitals reviewed.  Constitutional:      Appearance: Normal appearance.  Cardiovascular:     Rate and Rhythm: Normal rate and regular rhythm.     Heart sounds: Normal heart sounds.  Pulmonary:     Effort: Pulmonary effort is normal.     Breath sounds: Normal breath sounds.  Abdominal:     General: There is no distension.     Palpations: Abdomen is soft. There is no mass.  Musculoskeletal:        General: No swelling.  Lymphadenopathy:     Cervical: Cervical adenopathy present.  Skin:     General: Skin is warm.  Neurological:     General: No focal deficit present.     Mental Status: He is alert and oriented to person, place, and time.  Psychiatric:        Mood and Affect: Mood normal.        Behavior: Behavior normal.      LABORATORY DATA:  I have reviewed the labs as listed.  CBC    Component Value Date/Time   WBC 8.9 09/12/2019 1120   RBC 5.36 09/12/2019 1120   HGB 15.3 09/12/2019 1120   HGB 14.4 10/26/2018 1139   HCT 46.7 09/12/2019 1120   HCT 43.2 10/26/2018 1139   PLT 264 09/12/2019 1120   PLT 187 10/26/2018 1139   MCV 87.1 09/12/2019 1120   MCV 89 10/26/2018 1139   MCH 28.5 09/12/2019 1120   MCHC 32.8 09/12/2019 1120   RDW 13.3 09/12/2019 1120   RDW 13.3 10/26/2018 1139   LYMPHSABS 1,611 09/12/2019 1120   MONOABS 963 (H) 12/30/2015 0848   EOSABS 312 09/12/2019 1120   BASOSABS 71 09/12/2019 1120   CMP Latest Ref Rng & Units 09/12/2019 07/06/2019 04/13/2019  Glucose 65 - 99 mg/dL 87 86 94  BUN 7 - 25 mg/dL 13 17 12   Creatinine 0.70 - 1.25 mg/dL 0.98 1.09 1.03  Sodium 135 - 146 mmol/L 135 136 139  Potassium 3.5 - 5.3 mmol/L 4.0 4.0 3.9  Chloride 98 - 110 mmol/L 98 99 105  CO2 20 - 32 mmol/L 27 28 24   Calcium 8.6 - 10.3 mg/dL 9.4 9.9 9.9  Total Protein 6.1 - 8.1 g/dL 7.5 7.9 -  Total Bilirubin 0.2 - 1.2 mg/dL 0.9 1.1 -  Alkaline Phos 40 - 115 U/L - - -  AST 10 - 35 U/L 19 15 -  ALT 9 - 46 U/L 14 8(L) -       DIAGNOSTIC IMAGING:  I have independently reviewed the scans and discussed with the patient.    ASSESSMENT &  PLAN:   Supraclavicular lymphadenopathy 1.  Likely metastatic bladder cancer: -Patient evaluated for left supraclavicular adenopathy. -He had intermittent hematuria.  He was reportedly seen by Dr. Alyson Ingles and a cystoscopy was done 2 weeks ago. -We will try to obtain the records of cystoscopy. -PET scan on 10/30/2019 reviewed by me shows right anterior abdomen bladder mass with right hydroureteronephrosis.  There is pelvic,  retroperitoneal and left supraclavicular adenopathy. -We will contact Dr. Pollie Friar office and schedule his left supraclavicular lymph node biopsy. -His last biopsy was canceled as he lives by himself and does not drive.  He had to have transportation service. -I will see him back after the biopsy.  2.  Weight loss: -She reports 11 pound weight loss in the last 2 months.  His appetite has been good.  He is eating regularly. -Likely related to his underlying malignancy.    Total time spent is 30 minutes with more than 50% of time spent face-to-face discussing and reviewing scan images, further work-up, counseling and coordination of care.    Orders placed this encounter:  No orders of the defined types were placed in this encounter.     Derek Jack, MD [place] [phone]

## 2019-11-06 NOTE — Assessment & Plan Note (Signed)
1.  Likely metastatic bladder cancer: -Patient evaluated for left supraclavicular adenopathy. -He had intermittent hematuria.  He was reportedly seen by Dr. Alyson Ingles and a cystoscopy was done 2 weeks ago. -We will try to obtain the records of cystoscopy. -PET scan on 10/30/2019 reviewed by me shows right anterior abdomen bladder mass with right hydroureteronephrosis.  There is pelvic, retroperitoneal and left supraclavicular adenopathy. -We will contact Dr. Pollie Friar office and schedule his left supraclavicular lymph node biopsy. -His last biopsy was canceled as he lives by himself and does not drive.  He had to have transportation service. -I will see him back after the biopsy.  2.  Weight loss: -She reports 11 pound weight loss in the last 2 months.  His appetite has been good.  He is eating regularly. -Likely related to his underlying malignancy.

## 2019-11-06 NOTE — Patient Instructions (Addendum)
[place] at [facility] Discharge Instructions  You were seen today by Dr. Delton Coombes. He went over your recent lab results. He will see you back after your biopsy for follow up.   Thank you for choosing [place] at [facility] to provide your oncology and hematology care.  To afford each patient quality time with our provider, please arrive at least 15 minutes before your scheduled appointment time.   If you have a lab appointment with the [facility] please come in thru the  Main Entrance and check in at the main information desk  You need to re-schedule your appointment should you arrive 10 or more minutes late.  We strive to give you quality time with our providers, and arriving late affects you and other patients whose appointments are after yours.  Also, if you no show three or more times for appointments you may be dismissed from the clinic at the providers discretion.     Again, thank you for choosing [facility].  Our hope is that these requests will decrease the amount of time that you wait before being seen by our physicians.       _____________________________________________________________  Should you have questions after your visit to [facility], please contact our office at [phone] between the hours of 8:00 a.m. and 4:30 p.m.  Voicemails left after 4:00 p.m. will not be returned until the following business day.  For prescription refill requests, have your pharmacy contact our office and allow 72 hours.    Cancer Center Support Programs:   > Cancer Support Group  2nd Tuesday of the month 1pm-2pm, Journey Room

## 2019-11-14 ENCOUNTER — Ambulatory Visit: Payer: Medicare Other | Admitting: Family Medicine

## 2019-11-14 ENCOUNTER — Ambulatory Visit (HOSPITAL_COMMUNITY): Payer: Medicare Other

## 2019-11-16 NOTE — Progress Notes (Signed)
[place] [place] [address] Phone: [phone] Fax: [phone]      Your procedure is scheduled on Tuesday, November 21, 2019.  Report to [facility] Main Entrance "A" at 8:00 A.M., and check in at the Admitting office.  Call this number if you have problems the morning of surgery:  [phone]  Call [phone] if you have any questions prior to your surgery date Monday-Friday 8am-4pm    Remember:  Do not eat or drink after midnight the night before your surgery    Take these medicines the morning of surgery with A SIP OF WATER: atorvastatin (LIPITOR) metoprolol succinate (TOPROL-XL) fluticasone (FLONASE) - if needed nitroGLYCERIN (NITROSTAT) - if needed  7 days prior to surgery STOP taking any Aspirin (unless otherwise instructed by your surgeon), Aleve, Naproxen, Ibuprofen, Motrin, Advil, Goody's, BC's, all herbal medications, fish oil, and all vitamins.  Follow your surgeon's instructions on when to stop Aspirin and Plavix.  If no instructions were given by your surgeon then you will need to call the office to get those instructions.      The Morning of Surgery  Do not wear jewelry, make-up or nail polish.  Do not wear lotions, powders, perfumes, or deodorant  Do not shave 48 hours prior to surgery.  Do not bring valuables to the hospital.  [facility] is not responsible for any belongings or valuables.  If you are a smoker, DO NOT Smoke 24 hours prior to surgery  If you wear a CPAP at night please bring your mask the morning of surgery   Remember that you must have someone to transport you home after your surgery, and remain with you for 24 hours if you are discharged the same day.   Please bring cases for contacts, glasses, hearing aids, dentures or bridgework because it cannot be worn into surgery.    Leave your suitcase in the car.  After surgery it may be brought to your room.  For patients  admitted to the hospital, discharge time will be determined by your treatment team.  Patients discharged the day of surgery will not be allowed to drive home.    Special instructions:   [place]- Preparing For Surgery  Before surgery, you can play an important role. Because skin is not sterile, your skin needs to be as free of germs as possible. You can reduce the number of germs on your skin by washing with CHG (chlorahexidine gluconate) Soap before surgery.  CHG is an antiseptic cleaner which kills germs and bonds with the skin to continue killing germs even after washing.    Oral Hygiene is also important to reduce your risk of infection.  Remember - BRUSH YOUR TEETH THE MORNING OF SURGERY WITH YOUR REGULAR TOOTHPASTE  Please do not use if you have an allergy to CHG or antibacterial soaps. If your skin becomes reddened/irritated stop using the CHG.  Do not shave (including legs and underarms) for at least 48 hours prior to first CHG shower. It is OK to shave your face.  Please follow these instructions carefully.   1. Shower the NIGHT BEFORE SURGERY and the MORNING OF SURGERY with CHG Soap.   2. If you chose to wash your hair, wash your hair first as usual with your normal shampoo.  3. After you shampoo, rinse your hair and body thoroughly to remove the shampoo.  4. Use CHG as you would any other liquid  soap. You can apply CHG directly to the skin and wash gently with a scrungie or a clean washcloth.   5. Apply the CHG Soap to your body ONLY FROM THE NECK DOWN.  Do not use on open wounds or open sores. Avoid contact with your eyes, ears, mouth and genitals (private parts). Wash Face and genitals (private parts)  with your normal soap.   6. Wash thoroughly, paying special attention to the area where your surgery will be performed.  7. Thoroughly rinse your body with warm water from the neck down.  8. DO NOT shower/wash with your normal soap after using and rinsing off the CHG  Soap.  9. Pat yourself dry with a CLEAN TOWEL.  10. Wear CLEAN PAJAMAS to bed the night before surgery, wear comfortable clothes the morning of surgery  11. Place CLEAN SHEETS on your bed the night of your first shower and DO NOT SLEEP WITH PETS.    Day of Surgery:  Please shower the morning of surgery with the CHG soap Do not apply any deodorants/lotions. Please wear clean clothes to the hospital/surgery center.   Remember to brush your teeth WITH YOUR REGULAR TOOTHPASTE.   Please read over the following fact sheets that you were given.

## 2019-11-17 ENCOUNTER — Ambulatory Visit (INDEPENDENT_AMBULATORY_CARE_PROVIDER_SITE_OTHER): Payer: Self-pay | Admitting: Otolaryngology

## 2019-11-17 ENCOUNTER — Encounter (HOSPITAL_COMMUNITY)
Admission: RE | Admit: 2019-11-17 | Discharge: 2019-11-17 | Disposition: A | Discharge status: Home / Self Care | Payer: Medicare Other | Source: Ambulatory Visit | Attending: Otolaryngology | Admitting: Otolaryngology

## 2019-11-17 ENCOUNTER — Other Ambulatory Visit (HOSPITAL_COMMUNITY)
Admission: RE | Admit: 2019-11-17 | Discharge: 2019-11-17 | Disposition: A | Discharge status: Home / Self Care | Payer: Medicare Other | Source: Ambulatory Visit | Attending: Otolaryngology | Admitting: Otolaryngology

## 2019-11-17 ENCOUNTER — Encounter (HOSPITAL_COMMUNITY): Payer: Self-pay

## 2019-11-17 ENCOUNTER — Other Ambulatory Visit: Payer: Self-pay

## 2019-11-17 DIAGNOSIS — Z01812 Encounter for preprocedural laboratory examination: Secondary | ICD-10-CM | POA: Insufficient documentation

## 2019-11-17 DIAGNOSIS — Z20822 Contact with and (suspected) exposure to covid-19: Secondary | ICD-10-CM | POA: Insufficient documentation

## 2019-11-17 LAB — CBC
HCT: 27.6 % — ABNORMAL LOW (ref 39.0–52.0)
Hemoglobin: 8.3 g/dL — ABNORMAL LOW (ref 13.0–17.0)
MCH: 25.1 pg — ABNORMAL LOW (ref 26.0–34.0)
MCHC: 30.1 g/dL (ref 30.0–36.0)
MCV: 83.4 fL (ref 80.0–100.0)
Platelets: 273 10*3/uL (ref 150–400)
RBC: 3.31 MIL/uL — ABNORMAL LOW (ref 4.22–5.81)
RDW: 17.1 % — ABNORMAL HIGH (ref 11.5–15.5)
WBC: 8.3 10*3/uL (ref 4.0–10.5)
nRBC: 0 % (ref 0.0–0.2)

## 2019-11-17 LAB — COMPREHENSIVE METABOLIC PANEL
ALT: 12 U/L (ref 0–44)
AST: 19 U/L (ref 15–41)
Albumin: 3.8 g/dL (ref 3.5–5.0)
Alkaline Phosphatase: 67 U/L (ref 38–126)
Anion gap: 8 (ref 5–15)
BUN: 15 mg/dL (ref 8–23)
CO2: 24 mmol/L (ref 22–32)
Calcium: 8.8 mg/dL — ABNORMAL LOW (ref 8.9–10.3)
Chloride: 103 mmol/L (ref 98–111)
Creatinine, Ser: 0.99 mg/dL (ref 0.61–1.24)
GFR calc Af Amer: >60 mL/min (ref >60)
GFR calc non Af Amer: >60 mL/min (ref >60)
Glucose, Bld: 101 mg/dL — ABNORMAL HIGH (ref 70–99)
Potassium: 4.4 mmol/L (ref 3.5–5.1)
Sodium: 135 mmol/L (ref 135–145)
Total Bilirubin: 0.8 mg/dL (ref 0.3–1.2)
Total Protein: 7.1 g/dL (ref 6.5–8.1)

## 2019-11-17 LAB — SARS CORONAVIRUS 2 (TAT 6-24 HRS): SARS Coronavirus 2: NEGATIVE

## 2019-11-17 NOTE — Progress Notes (Signed)
Spoke with Gay Filler, at Dr. Pollie Friar office, informed her that patient had some abnormal labs, specifically hgb of 8.3/hct 27.6.

## 2019-11-17 NOTE — Progress Notes (Addendum)
PCP - Dr. Moshe Cipro Cardiologist - Dr. Domenic Polite  PPM/ICD - n/a Device Orders -  Rep Notified -   Chest x-ray - n/a EKG - 12/06/2018 Stress Test - patient denies ECHO - 12/29/2018 Cardiac Cath - 10/31/2018  Sleep Study - patient denies, negative stop bang CPAP -   Fasting Blood Sugar - n/a Checks Blood Sugar _____ times a day  Blood Thinner Instructions: stop Plavix 4 days prior to surgery, last dose 11/17/2019 Aspirin Instructions: stop Aspirin 4 days prior to surgery, last dose 11/17/2019  ERAS Protcol - n/a PRE-SURGERY Ensure or G2-   COVID TEST- scheduled on 11/17/2019 after PAT appointment  Patient states he smokes marijuana 2 times a day, once in the morning and once in the evening.  Last use was the evening of 11/16/2019   Anesthesia review: yes, cardiac history  Patient denies shortness of breath, fever, cough and chest pain at PAT appointment   All instructions explained to the patient, with a verbal understanding of the material. Patient agrees to go over the instructions while at home for a better understanding. Patient also instructed to self quarantine after being tested for COVID-19. The opportunity to ask questions was provided.

## 2019-11-20 NOTE — Progress Notes (Signed)
Anesthesia Chart Review:  Case: [id number] Date/Time: 11/21/19 0945   Procedure: LYMPH NODE BIOPSY OF NECK (N/A )   Anesthesia type: General   Pre-op diagnosis: LYMPHADENOPATHY   Location: [facility] OR ROOM 08 / [facility] OR   Surgeons: Rozetta Nunnery, MD      DISCUSSION:  Patient is a 69 year old male scheduled for the above procedure. Surgery was initially scheduled for 10/20/19, but it was postponed because he did not have anyone to stay with him for 24 hours after surgery. He has supraclavicular lymphadenopathy with weight loss and intermittent hematuria. Metastatic bladder cancer is suspected. Biopsy needed for definitive tissue diagnosis.   Other history includes former smoker (quit 02/19/1970), aortic valve disease (severe AI with secondary cardiomyopathy s/p bioprosthetic AVR 10/02/03 at [facility]; history of prosthetic valve endocarditis but otherwise no know details; s/p semiurgent TAVR at [facility] 10/20/17 for new/acute decompensated heart failure with severe AI and LV dysfunction/EF 20-25% due in part to torn cusp of previously placed bioprosthetic AVR), CAD (NSTEMI, s/p PCI with BMS to RCA 08/10/2013), right CFV DVT (04/17/10), HTN, PAD (left SFA embolectomy 02/06/11; s/p atherectomy and drug-coated balloon angioplasty left SFA 10/31/2018), 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  Last cardiology evaluation with Dr. Domenic Polite on 08/03/19. LVEF stable at 40-45% 12/2018. He did have some perivalvular AR. Follow-up echo planned around his six month follow-up visit.   H/H 8.3/27.6 on 11/17/19, down from 15.3/46.7 on 10/12/18. He has suspected metastatic bladder cancer and intermittent hematuria, so drop in H/H may be somewhat expected; however, I did sent Dr. Delton Coombes a message on 11/20/19 regarding labs and PAT RN called  results to Dr. Pollie Friar staff on 11/17/19. Dr. Delton Coombes will follow for anemia work-up post-operatively. I also updated anesthesiologist Hoy Morn, MD regarding labs.  No tachycardia. SBP 149, but DBP low at 41. Reportedly, last Plavix and ASA 11/17/19 in anticipation of biopsy.  11/17/19 presurgical COVID-19 test was negative. If Mr. Tritch is not symptomatic of his anemia then would not anticipate additional preoperative labs for this procedure. Surgeon and assigned anesthesiologist to evaluate on the day of surgery--defer additional preoperative orders, if any, to them.    VS: BP (!) 149/41   Pulse (!) 57   Temp 36.5 C   Resp 18   Ht 5\' 4"  (1.626 m)   Wt 53.5 kg   SpO2 100%   BMI 20.25 kg/m    PROVIDERS: [name], MD is PCP. Last visit with Cherly Beach, NP on 10/17/19.  Rozann Lesches, MD is cardiologist. Last evaluation 08/03/19. Six month follow-up recommended with echo prior to visit.  Derek Jack, MD is HEM-ONC. Last visit 11/06/19. Next appointment is scheduled for 11/28/19.  - Nicolette Bang, MD is urologist (by notes). There is a CT ordered for this month by Franchot Gallo, MD.    LABS: Last lab results including 11/17/19 preoperative CBC and CMET include: Lab Results  Component Value Date   WBC 8.3 11/17/2019   HGB 8.3 (L) 11/17/2019   HCT 27.6 (L) 11/17/2019   PLT 273 11/17/2019   GLUCOSE 101 (H) 11/17/2019   ALT 12 11/17/2019   AST 19 11/17/2019   NA 135 11/17/2019   K 4.4 11/17/2019   CL 103 11/17/2019   CREATININE 0.99 11/17/2019   BUN 15 11/17/2019   CO2 24 11/17/2019  TSH 0.45 09/12/2019   PSA 5.1 (H) 09/12/2019   INR 1.0 04/13/2019  Non-reactive HIV, Hep B and Hep C antibody testing 09/28/19.   CBC Latest Ref Rng & Units 11/17/2019 09/12/2019 04/13/2019  WBC 4.0 - 10.5 K/uL 8.3 8.9 8.5  Hemoglobin 13.0 - 17.0 g/dL 8.3(L) 15.3 16.0  Hematocrit 39.0 - 52.0 % 27.6(L) 46.7 48.8  Platelets 150 - 400 K/uL 273 264 200     IMAGES: PET Scan 10/30/19: IMPRESSION: - Irregular bladder wall thickening, particularly at the right anterior bladder dome, suspicious for primary bladder neoplasm. Cystoscopy is suggested. - Right ureteronephrosis without frank hydronephrosis, presumably related to distal right ureteral obstruction secondary to a bladder lesion at the right ureteral orifice. - Associated pelvic, retroperitoneal, and left supraclavicular/cervical lymphadenopathy, likely reflecting nodal metastases.  CT chest 09/19/19: IMPRESSION: - No evidence of adenopathy in the chest. - Prior stent graft repair of ascending thoracic aortic aneurysm. Mild aneurysmal dilatation of the distal ascending aorta, 4 cm, stable since 2012. - Coronary artery disease, cardiomegaly. - Aortic Atherosclerosis (ICD10-I70.0).  CT soft tissue neck 09/19/19: IMPRESSION: Cluster of enlarged lymph nodes in the left posterior triangle. Tissue sampling is recommended.   EKG: 12/06/18 ([facility]): Sinus bradycardia with first-degree AV block @ 52 bpm Left axis deviation ST and T wave abnormality, consider lateral ischemia   CV: TTE 12/29/18: Impressions: 1. The left ventricle has mildly reduced systolic function, with an ejection fraction of 45-50%. The cavity size was moderately dilated. There is mildly increased left ventricular wall thickness. Left ventricular diastolic Doppler parameters are  consistent with impaired relaxation. 2. The right ventricle has normal systolic function. The cavity was normal. There is no increase in right ventricular wall thickness. 3. Left atrial size was moderately dilated. 4. The tricuspid valve is grossly normal. 5. Post bioprosthetic AVR with valve in valve TAVR using 26 mm Sapien 3 Small perivalvular leak on septal side Valve appears to have been placed deep in ventrical Also some ? central AR. The peak gradient has increased from 15 mmHg to 21 mmHg since  12/2017 Also  some unusual color flow in subcostal views in LVOT and 3 chamber view shows some diastolic flow out by posterior wall so may be underestimating degree of AR Can consider TEE if clinicaly indicated after COVID 19 restrictions lifted. 6. The interatrial septum was not well visualized.) (Question of at least mild AR. Echo prior to ~ 01/2020 visit recommended by cardiologist Dr. Domenic Polite. Appointments not scheduled yet. Comparison LVEF 40-45% 12/27/17 echo; 25% 10/21/17 echo; 45% CCTA; 20-25% 09/29/17)  Cardiac cath 10/01/17 (pre-TAVR, [place]): - Cors only pre AVR.Mild to moderate non-obstructive CAD. Outlined in 10/02/17 discharge summary as: Left heart catheterization (10/01/17) Left Main  Ost LM lesion, 20% stenosed.  LM lesion, 30% stenosed. distal  Left Anterior Descending  The vessel exhibits minimal luminal irregularities (<10%).  Left Circumflex  Prox Cx lesion, 40% stenosed. small vessel  Right Coronary Artery  Prox RCA lesion, 30% stenosed.  Mid RCA lesion, 30% stenosed. The lesion was previously treated using a stent (unknown type).  Right Posterior Descending Artery  The vessel exhibits minimal luminal irregularities (<10%).     Past Medical History:  Diagnosis Date  . Arterial embolus of lower extremity ([place]) 11/23/2013  . Arthritis   . CAD (coronary artery disease)    a. s/p PCI with 3.5 x 18 BMS to mid RCA, non obst LAD, Lcx patent 08/10/13  . Embolus of femoral artery [facility]) 2012   Surgical  repair, previously on Coumadin  . Essential hypertension, benign   . ETOH abuse   . History of DVT (deep vein thrombosis) 2011  . History of GI bleed    February 2012   . Ingrown left greater toenail 09/04/2018  . 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 Mycotic aneurysm ([place])    Associated with GI bleed and hepatic artery aneurysm status post embolization February 2012  . Prosthetic valve endocarditis ([place])   . PVD (peripheral vascular disease) ([place])   . S/P aortic valve replacement     Details not clear -  reportedly 1971 per patient    Past Surgical History:  Procedure Laterality Date  . ABDOMINAL AORTOGRAM W/LOWER EXTREMITY N/A 10/31/2018   Procedure: ABDOMINAL AORTOGRAM W/LOWER EXTREMITY;  Surgeon: Lorretta Harp, MD;  Location: [place] CV LAB;  Service: Cardiovascular;  Laterality: N/A;  . Aoric valve replacement  January 2005   59mm Medtronic Freestyle stentless porcine bioprosthesis [facility])  . CARDIAC CATHETERIZATION     30-40% mid LAD, 20-30% ostial D1, proximal 30-40% RCA, 95% mid RCA thrombus, 40% distal stenosis. EF 45%, inferior HK.  [name] COLONOSCOPY N/A 04/20/2014   Procedure: COLONOSCOPY;  Surgeon: Rogene Houston, MD;  Location: [facility];  Service: Endoscopy;  Laterality: N/A;  930  . CORONARY ANGIOPLASTY WITH STENT PLACEMENT  07/2013  . INGUINAL HERNIA REPAIR Bilateral 04/17/2019   Procedure: ATTEMPTED LAPAROSCOPIC BILATERAL INGUINAL HERNIA REPAIR;  Surgeon: Coralie Keens, MD;  Location: [facility];  Service: General;  Laterality: Bilateral;  . INGUINAL HERNIA REPAIR Bilateral 04/17/2019   Procedure: Hernia Repair Inguinal Adult;  Surgeon: Coralie Keens, MD;  Location: [place];  Service: General;  Laterality: Bilateral;  . LEFT HEART CATHETERIZATION WITH CORONARY ANGIOGRAM N/A 08/10/2013   Procedure: LEFT HEART CATHETERIZATION WITH CORONARY ANGIOGRAM;  Surgeon: Blane Ohara, MD;  Location: [facility] CATH LAB;  Service: Cardiovascular;  Laterality: N/A;  . Left leg embolectomy  May 2012   Dr. Trula Slade - reason for lifelong coumadin  . PERIPHERAL VASCULAR ATHERECTOMY  10/31/2018   Procedure: PERIPHERAL VASCULAR ATHERECTOMY;  Surgeon: Lorretta Harp, MD;  Location: [place] CV LAB;  Service: Cardiovascular;;  left SFA    MEDICATIONS: . aspirin EC 81 MG tablet  . atorvastatin (LIPITOR) 40 MG tablet  . cholecalciferol (VITAMIN D) 1000 units tablet  . clopidogrel (PLAVIX) 75 MG tablet  . fluticasone (FLONASE) 50 MCG/ACT nasal spray  . furosemide  (LASIX) 40 MG tablet  . lisinopril (ZESTRIL) 20 MG tablet  . metoprolol succinate (TOPROL-XL) 50 MG 24 hr tablet  . montelukast (SINGULAIR) 10 MG tablet  . nitroGLYCERIN (NITROSTAT) 0.4 MG SL tablet  . trolamine salicylate (ASPERCREME) 10 % cream   No current facility-administered medications for this encounter.    Myra Gianotti, PA-C Surgical Short Stay/Anesthesiology [facility] Phone [phone] [facility] Phone [phone] 11/20/2019 1:18 PM

## 2019-11-20 NOTE — Anesthesia Preprocedure Evaluation (Addendum)
Anesthesia Evaluation  Patient identified by MRN, date of birth, ID band Patient awake    Reviewed: Allergy & Precautions, NPO status , Patient's Chart, lab work & pertinent test results, reviewed documented beta blocker date and time   History of Anesthesia Complications Negative for: history of anesthetic complications  Airway Mallampati: I  TM Distance: >3 FB Neck ROM: Full    Dental  (+) Poor Dentition, Missing, Dental Advisory Given   Pulmonary former smoker (quit 1971),  11/17/2019 SARS coronavirus NEG   breath sounds clear to auscultation       Cardiovascular hypertension, Pt. on medications and Pt. on home beta blockers + CAD, + Cardiac Stents, + Peripheral Vascular Disease (h/o hepatic artery aneurysm) and + DVT  + Valvular Problems/Murmurs (s/p AVR)  Rhythm:Regular Rate:Normal  '20 ECHO: EF 40-45%, Post bioprosthetic AVR with valve in valve TAVR using 26 mm Sapien 3 Small perivalvular  leak on septal side   Neuro/Psych negative neurological ROS     GI/Hepatic (+)     substance abuse  alcohol use, s/p GI bleed   Endo/Other  negative endocrine ROS  Renal/GU negative Renal ROS     Musculoskeletal   Abdominal   Peds  Hematology  (+) Blood dyscrasia (Hb 8.3), anemia ,   Anesthesia Other Findings   Reproductive/Obstetrics                            Anesthesia Physical Anesthesia Plan  ASA: III  Anesthesia Plan: General   Post-op Pain Management:    Induction: Intravenous  PONV Risk Score and Plan: 2 and Ondansetron and Dexamethasone  Airway Management Planned: LMA  Additional Equipment:   Intra-op Plan:   Post-operative Plan:   Informed Consent: I have reviewed the patients History and Physical, chart, labs and discussed the procedure including the risks, benefits and alternatives for the proposed anesthesia with the patient or authorized representative who has  indicated his/her understanding and acceptance.     Dental advisory given  Plan Discussed with: CRNA and Surgeon  Anesthesia Plan Comments: (See PAT note written 11/20/2019 by Myra Gianotti, PA-C.  H/H 8.3/27.6 11/17/19. )       Anesthesia Quick Evaluation

## 2019-11-21 ENCOUNTER — Ambulatory Visit (HOSPITAL_COMMUNITY): Payer: Medicare Other | Admitting: Physician Assistant

## 2019-11-21 ENCOUNTER — Ambulatory Visit (HOSPITAL_COMMUNITY)
Admission: RE | Admit: 2019-11-21 | Discharge: 2019-11-21 | Disposition: A | Discharge status: Home / Self Care | Payer: Medicare Other | Attending: Otolaryngology | Admitting: Otolaryngology

## 2019-11-21 ENCOUNTER — Other Ambulatory Visit: Payer: Self-pay

## 2019-11-21 ENCOUNTER — Ambulatory Visit: Payer: Medicare Other | Admitting: Family Medicine

## 2019-11-21 ENCOUNTER — Encounter (HOSPITAL_COMMUNITY)
Admission: RE | Disposition: A | Discharge status: Home / Self Care | Payer: Self-pay | Source: Home / Self Care | Attending: Otolaryngology

## 2019-11-21 ENCOUNTER — Ambulatory Visit (HOSPITAL_COMMUNITY): Payer: Medicare Other | Admitting: Certified Registered"

## 2019-11-21 ENCOUNTER — Encounter (HOSPITAL_COMMUNITY): Payer: Self-pay | Admitting: Otolaryngology

## 2019-11-21 DIAGNOSIS — C801 Malignant (primary) neoplasm, unspecified: Secondary | ICD-10-CM | POA: Diagnosis not present

## 2019-11-21 DIAGNOSIS — Z952 Presence of prosthetic heart valve: Secondary | ICD-10-CM | POA: Insufficient documentation

## 2019-11-21 DIAGNOSIS — I739 Peripheral vascular disease, unspecified: Secondary | ICD-10-CM | POA: Insufficient documentation

## 2019-11-21 DIAGNOSIS — Z7902 Long term (current) use of antithrombotics/antiplatelets: Secondary | ICD-10-CM | POA: Diagnosis not present

## 2019-11-21 DIAGNOSIS — E785 Hyperlipidemia, unspecified: Secondary | ICD-10-CM | POA: Diagnosis not present

## 2019-11-21 DIAGNOSIS — Z79899 Other long term (current) drug therapy: Secondary | ICD-10-CM | POA: Insufficient documentation

## 2019-11-21 DIAGNOSIS — C770 Secondary and unspecified malignant neoplasm of lymph nodes of head, face and neck: Secondary | ICD-10-CM | POA: Insufficient documentation

## 2019-11-21 DIAGNOSIS — R591 Generalized enlarged lymph nodes: Secondary | ICD-10-CM

## 2019-11-21 DIAGNOSIS — I2510 Atherosclerotic heart disease of native coronary artery without angina pectoris: Secondary | ICD-10-CM | POA: Diagnosis not present

## 2019-11-21 DIAGNOSIS — Z87891 Personal history of nicotine dependence: Secondary | ICD-10-CM | POA: Diagnosis not present

## 2019-11-21 DIAGNOSIS — Z7982 Long term (current) use of aspirin: Secondary | ICD-10-CM | POA: Insufficient documentation

## 2019-11-21 DIAGNOSIS — I10 Essential (primary) hypertension: Secondary | ICD-10-CM | POA: Diagnosis not present

## 2019-11-21 DIAGNOSIS — Z86718 Personal history of other venous thrombosis and embolism: Secondary | ICD-10-CM | POA: Diagnosis not present

## 2019-11-21 DIAGNOSIS — R590 Localized enlarged lymph nodes: Secondary | ICD-10-CM | POA: Diagnosis not present

## 2019-11-21 DIAGNOSIS — R599 Enlarged lymph nodes, unspecified: Secondary | ICD-10-CM | POA: Diagnosis not present

## 2019-11-21 HISTORY — PX: LYMPH NODE BIOPSY: SHX201

## 2019-11-21 SURGERY — LYMPH NODE BIOPSY
Anesthesia: General | Site: Neck | Laterality: Left

## 2019-11-21 MED ORDER — PROPOFOL 10 MG/ML IV BOLUS
INTRAVENOUS | Status: DC | PRN
  Administered 2019-11-21: 50 mg via INTRAVENOUS
  Administered 2019-11-21: 200 mg via INTRAVENOUS

## 2019-11-21 MED ORDER — FENTANYL CITRATE (PF) 100 MCG/2ML IJ SOLN
INTRAMUSCULAR | Status: DC | PRN
  Administered 2019-11-21 (×3): 25 ug via INTRAVENOUS

## 2019-11-21 MED ORDER — CEFAZOLIN SODIUM-DEXTROSE 2-4 GM/100ML-% IV SOLN
INTRAVENOUS | Status: AC
  Filled 2019-11-21: qty 100

## 2019-11-21 MED ORDER — EPHEDRINE SULFATE-NACL 50-0.9 MG/10ML-% IV SOSY
PREFILLED_SYRINGE | INTRAVENOUS | Status: DC | PRN
  Administered 2019-11-21 (×2): 10 mg via INTRAVENOUS

## 2019-11-21 MED ORDER — PHENYLEPHRINE 40 MCG/ML (10ML) SYRINGE FOR IV PUSH (FOR BLOOD PRESSURE SUPPORT)
PREFILLED_SYRINGE | INTRAVENOUS | Status: DC | PRN
  Administered 2019-11-21 (×2): 80 ug via INTRAVENOUS

## 2019-11-21 MED ORDER — LIDOCAINE 2% (20 MG/ML) 5 ML SYRINGE
INTRAMUSCULAR | Status: DC | PRN
  Administered 2019-11-21: 30 mg via INTRAVENOUS

## 2019-11-21 MED ORDER — LACTATED RINGERS IV SOLN: INTRAVENOUS | Status: DC

## 2019-11-21 MED ORDER — CHLORHEXIDINE GLUCONATE CLOTH 2 % EX PADS: 6.0000 | MEDICATED_PAD | Freq: Once | CUTANEOUS | Status: DC

## 2019-11-21 MED ORDER — FENTANYL CITRATE (PF) 250 MCG/5ML IJ SOLN
INTRAMUSCULAR | Status: AC
  Filled 2019-11-21: qty 5

## 2019-11-21 MED ORDER — 0.9 % SODIUM CHLORIDE (POUR BTL) OPTIME
TOPICAL | Status: DC | PRN
  Administered 2019-11-21: 1000 mL

## 2019-11-21 MED ORDER — PROPOFOL 10 MG/ML IV BOLUS
INTRAVENOUS | Status: AC
  Filled 2019-11-21: qty 20

## 2019-11-21 MED ORDER — LIDOCAINE-EPINEPHRINE 1 %-1:100000 IJ SOLN
INTRAMUSCULAR | Status: DC | PRN
  Administered 2019-11-21: 2 mL

## 2019-11-21 MED ORDER — LIDOCAINE-EPINEPHRINE 1 %-1:100000 IJ SOLN
INTRAMUSCULAR | Status: AC
  Filled 2019-11-21: qty 1

## 2019-11-21 MED ORDER — DEXAMETHASONE SODIUM PHOSPHATE 4 MG/ML IJ SOLN
INTRAMUSCULAR | Status: DC | PRN
  Administered 2019-11-21: 10 mg via INTRAVENOUS

## 2019-11-21 MED ORDER — SODIUM CHLORIDE 0.9 % IV SOLN
INTRAVENOUS | Status: DC | PRN
  Administered 2019-11-21: 75 ug/min via INTRAVENOUS

## 2019-11-21 MED ORDER — ONDANSETRON HCL 4 MG/2ML IJ SOLN
INTRAMUSCULAR | Status: DC | PRN
  Administered 2019-11-21: 4 mg via INTRAVENOUS

## 2019-11-21 MED ORDER — CEFAZOLIN SODIUM-DEXTROSE 2-4 GM/100ML-% IV SOLN
2.0000 g | INTRAVENOUS | Status: AC
  Administered 2019-11-21: 2 g via INTRAVENOUS

## 2019-11-21 SURGICAL SUPPLY — 40 items
BLADE CLIPPER SURG (BLADE) IMPLANT
BLADE SURG 15 STRL LF DISP TIS (BLADE) IMPLANT
BLADE SURG 15 STRL SS (BLADE) ×2
CANISTER SUCT 3000ML PPV (MISCELLANEOUS) ×3 IMPLANT
CLEANER TIP ELECTROSURG 2X2 (MISCELLANEOUS) ×3 IMPLANT
CNTNR URN SCR LID CUP LEK RST (MISCELLANEOUS) IMPLANT
CONT SPEC 4OZ STRL OR WHT (MISCELLANEOUS) ×2
CORD BIPOLAR FORCEPS 12FT (ELECTRODE) ×2 IMPLANT
COVER SURGICAL LIGHT HANDLE (MISCELLANEOUS) ×3 IMPLANT
COVER WAND RF STERILE (DRAPES) ×3 IMPLANT
DERMABOND ADVANCED (GAUZE/BANDAGES/DRESSINGS) ×2
DERMABOND ADVANCED .7 DNX12 (GAUZE/BANDAGES/DRESSINGS) IMPLANT
DRAPE HALF SHEET 40X57 (DRAPES) ×2 IMPLANT
ELECT COATED BLADE 2.86 ST (ELECTRODE) ×3 IMPLANT
ELECT REM PT RETURN 9FT ADLT (ELECTROSURGICAL) ×3
ELECTRODE REM PT RTRN 9FT ADLT (ELECTROSURGICAL) ×1 IMPLANT
FORCEPS BIPOLAR SPETZLER 8 1.0 (NEUROSURGERY SUPPLIES) ×2 IMPLANT
GAUZE SPONGE 4X4 12PLY STRL (GAUZE/BANDAGES/DRESSINGS) ×3 IMPLANT
GLOVE SS BIOGEL STRL SZ 7.5 (GLOVE) ×1 IMPLANT
GLOVE SUPERSENSE BIOGEL SZ 7.5 (GLOVE) ×2
GOWN STRL REUS W/ TWL LRG LVL3 (GOWN DISPOSABLE) ×2 IMPLANT
GOWN STRL REUS W/TWL LRG LVL3 (GOWN DISPOSABLE) ×4
KIT BASIN OR (CUSTOM PROCEDURE TRAY) ×3 IMPLANT
KIT TURNOVER KIT B (KITS) ×3 IMPLANT
NDL HYPO 25GX1X1/2 BEV (NEEDLE) IMPLANT
NEEDLE HYPO 25GX1X1/2 BEV (NEEDLE) ×3 IMPLANT
NS IRRIG 1000ML POUR BTL (IV SOLUTION) ×3 IMPLANT
PAD ARMBOARD 7.5X6 YLW CONV (MISCELLANEOUS) ×6 IMPLANT
PENCIL BUTTON HOLSTER BLD 10FT (ELECTRODE) ×3 IMPLANT
SPECIMEN JAR SMALL (MISCELLANEOUS) ×2 IMPLANT
SUT CHROMIC 3 0 PS 2 (SUTURE) ×2 IMPLANT
SUT ETHILON 5 0 P 3 18 (SUTURE)
SUT NYLON ETHILON 5-0 P-3 1X18 (SUTURE) IMPLANT
SUT SILK 2 0 PERMA HAND 18 BK (SUTURE) ×2 IMPLANT
SUT SILK 3 0 (SUTURE)
SUT SILK 3-0 18XBRD TIE 12 (SUTURE) IMPLANT
SUT VIC AB 3-0 FS2 27 (SUTURE) IMPLANT
TOWEL GREEN STERILE FF (TOWEL DISPOSABLE) ×3 IMPLANT
TRAY ENT MC OR (CUSTOM PROCEDURE TRAY) ×3 IMPLANT
WATER STERILE IRR 1000ML POUR (IV SOLUTION) ×3 IMPLANT

## 2019-11-21 NOTE — Brief Op Note (Signed)
11/21/2019  11:19 AM  PATIENT:  [name]  69 y.o. male  PRE-OPERATIVE DIAGNOSIS:  LYMPHADENOPATHY  POST-OPERATIVE DIAGNOSIS:  LYMPHADENOPATHY  PROCEDURE:  Procedure(s): Left Neck LYMPH NODE BIOPSY (Left)  SURGEON:  Surgeon(s) and Role:    Rozetta Nunnery, MD - Primary  PHYSICIAN ASSISTANT:   ASSISTANTS: none   ANESTHESIA:   general  EBL:  50 mL   BLOOD ADMINISTERED:none  DRAINS: none   LOCAL MEDICATIONS USED:  LIDOCAINE with EPI 3 cc  SPECIMEN:  Source of Specimen:  left posterior neck node  DISPOSITION OF SPECIMEN:  PATHOLOGY  COUNTS:  YES  TOURNIQUET:  * No tourniquets in log *  DICTATION: .Other Dictation: Dictation Number [id number]  PLAN OF CARE: Discharge to home after PACU  PATIENT DISPOSITION:  PACU - hemodynamically stable.   Delay start of Pharmacological VTE agent (>24hrs) due to surgical blood loss or risk of bleeding: yes

## 2019-11-21 NOTE — Discharge Instructions (Addendum)
Keep incision site dry for the next 24 hrs Tylenol or ibuprofen prn pain Call office for follow up appt in 6-10 days  [phone] Restart your plavix tomorrow

## 2019-11-21 NOTE — Anesthesia Procedure Notes (Signed)
Procedure Name: LMA Insertion Date/Time: 11/21/2019 10:10 AM Performed by: Griffin Dakin, CRNA Pre-anesthesia Checklist: Patient identified, Emergency Drugs available, Suction available and Patient being monitored Patient Re-evaluated:Patient Re-evaluated prior to induction Oxygen Delivery Method: Circle system utilized Preoxygenation: Pre-oxygenation with 100% oxygen Induction Type: IV induction LMA: LMA inserted LMA Size: 4.0 Number of attempts: 1 Placement Confirmation: ETT inserted through vocal cords under direct vision,  positive ETCO2 and breath sounds checked- equal and bilateral Tube secured with: Tape Dental Injury: Teeth and Oropharynx as per pre-operative assessment

## 2019-11-21 NOTE — Interval H&P Note (Signed)
History and Physical Interval Note:  11/21/2019 9:44 AM  [name]  has presented today for surgery, with the diagnosis of LYMPHADENOPATHY.  The various methods of treatment have been discussed with the patient and family. After consideration of risks, benefits and other options for treatment, the patient has consented to  Procedure(s): LYMPH NODE BIOPSY OF NECK (N/A) as a surgical intervention.  The patient's history has been reviewed, patient examined, no change in status, stable for surgery.  I have reviewed the patient's chart and labs.  Questions were answered to the patient's satisfaction.     Melony Overly

## 2019-11-21 NOTE — Anesthesia Postprocedure Evaluation (Signed)
Anesthesia Post Note  Patient: [name]  Procedure(s) Performed: Left Neck LYMPH NODE BIOPSY (Left Neck)     Patient location during evaluation: PACU Anesthesia Type: General Level of consciousness: awake and alert, patient cooperative and oriented Pain management: pain level controlled Vital Signs Assessment: post-procedure vital signs reviewed and stable Respiratory status: spontaneous breathing, nonlabored ventilation and respiratory function stable Cardiovascular status: blood pressure returned to baseline and stable Postop Assessment: no apparent nausea or vomiting Anesthetic complications: no    Last Vitals:  Vitals:   11/21/19 1145 11/21/19 1200  BP: (!) 138/57 (!) 144/54  Pulse: 60 62  Resp: (!) 21 14  Temp:  36.6 C  SpO2: 100% 100%    Last Pain:  Vitals:   11/21/19 1200  TempSrc:   PainSc: 0-No pain                 Kang Ishida,E. Carmeline Kowal

## 2019-11-21 NOTE — Op Note (Signed)
NAME: [name], [name] MEDICAL RECORD [id number] ACCOUNT [id number] DATE OF BIRTH:1951-05-13 FACILITY: [facility] LOCATION: [facility] PHYSICIAN:Markale Birdsell Lincoln Maxin, MD  OPERATIVE REPORT  DATE OF PROCEDURE:  11/21/2019  PREOPERATIVE DIAGNOSIS:  Left neck posterior supraclavicular lymphadenopathy.  POSTOPERATIVE DIAGNOSIS:  Left neck posterior supraclavicular lymphadenopathy.  OPERATION PERFORMED:  Excisional biopsy of left posterior neck node.  SURGEON:  Melony Overly, MD  ANESTHESIA:  LMA.  ESTIMATED BLOOD LOSS:  Minimal.  COMPLICATIONS:  None.  BRIEF CLINICAL NOTE:  The patient is a 69 year old gentleman who states that he has had an enlarged left neck nodes for several years.  However, on exam, he has several enlarged left supraclavicular nodes in the inferior accessory chain of lymph nodes.   He is taken to the operating room at this time for excisional biopsy of 1 of the neck nodes.  Upper airway exam is otherwise normal to evaluation.  DESCRIPTION OF PROCEDURE:  The patient was brought to the operating room and underwent general LMA anesthesia.  The left neck was marked and prepped out with Betadine solution.  The patient had several enlarged neck nodes in this region and it was  elected to remove one of the more superior nodes that was a little bit larger.  This was just deep to the external jugular vein.  The area was prepped with Betadine solution, injected with 3 mL of Xylocaine with epinephrine for hemostasis.  A horizontal  incision was made directly over the node.  Dissection was carried down through the skin and subcutaneous tissue.  Platysma muscle was divided and the node was encountered.  Just anterior to the node was the external jugular vein branch and just posterior  to the node was an accessory nerve.  Dissection was carried down between the accessory nerve and the external jugular vein.  There were several adhesions around the node that were carefully  dissected out.  Hemostasis was obtained with bipolar cautery.   The node was removed and sent to pathology.  Wound was irrigated with saline and closed with 3-0 chromic suture subcutaneously and 5-0 Vicryl to reapproximate the skin edges, followed by Dermabond.  The patient tolerated this well, was awoken from  anesthesia and transferred to recovery room  postoperatively doing well.  DISPOSITION:  The patient is discharged home later this morning on Tylenol and ibuprofen p.r.n. pain.  He will follow up in my office in 1 week for recheck and review of final pathology.  VN/NUANCE  D:11/21/2019 T:11/21/2019 JOB:[id number]

## 2019-11-21 NOTE — H&P (Signed)
PREOPERATIVE H&P  Chief Complaint: Left neck lymphadenopathy  HPI: [name] is a 69 y.o. male who presents for evaluation of left neck lymphadenopathy that patient has stated that has been there for several years however it was just recently noted by his PCP who obtained a CT scan and referred him to oncology.  On review of the CT scan he has significant left neck lymphadenopathy that is firm and mobile to palpation on clinical exam.  He is scheduled for a PET scan.  Upper airway exam is clinically negative for any primary cancer as his review of the CT scan.  He is taken to the operating room at this time for open left neck biopsy of the the left supraclavicular adenopathy.  Past Medical History:  Diagnosis Date  . Arterial embolus of lower extremity ([place]) 11/23/2013  . Arthritis   . CAD (coronary artery disease)    a. s/p PCI with 3.5 x 18 BMS to mid RCA, non obst LAD, Lcx patent 08/10/13  . Embolus of femoral artery [facility]) 2012   Surgical repair, previously on Coumadin  . Essential hypertension, benign   . ETOH abuse   . History of DVT (deep vein thrombosis) 2011  . History of GI bleed    February 2012   . Ingrown left greater toenail 09/04/2018  . 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 Prosthetic valve endocarditis ([place])   . PVD (peripheral vascular disease) ([place])   . S/P aortic valve replacement    Details not clear -  reportedly 1971 per patient   Past Surgical History:  Procedure Laterality Date  . ABDOMINAL AORTOGRAM W/LOWER EXTREMITY N/A 10/31/2018   Procedure: ABDOMINAL AORTOGRAM W/LOWER EXTREMITY;  Surgeon: Lorretta Harp, MD;  Location: [place] [facility];  Service: Cardiovascular;  Laterality: N/A;  . Aoric valve replacement  January 2005   80mm Medtronic Freestyle stentless porcine bioprosthesis [facility])  . CARDIAC CATHETERIZATION     30-40% mid  LAD, 20-30% ostial D1, proximal 30-40% RCA, 95% mid RCA thrombus, 40% distal stenosis. EF 45%, inferior HK.  [name] COLONOSCOPY N/A 04/20/2014   Procedure: COLONOSCOPY;  Surgeon: Rogene Houston, MD;  [facility];  Service: Endoscopy;  Laterality: N/A;  930  . CORONARY ANGIOPLASTY WITH STENT PLACEMENT  07/2013  . INGUINAL HERNIA REPAIR Bilateral 04/17/2019   Procedure: ATTEMPTED LAPAROSCOPIC BILATERAL INGUINAL HERNIA REPAIR;  Surgeon: Coralie Keens, MD;  Location: [place];  Service: General;  Laterality: Bilateral;  . INGUINAL HERNIA REPAIR Bilateral 04/17/2019   Procedure: Hernia Repair Inguinal Adult;  Surgeon: Coralie Keens, MD;  Location: [place];  Service: General;  Laterality: Bilateral;  . LEFT HEART CATHETERIZATION WITH CORONARY ANGIOGRAM N/A 08/10/2013   Procedure: LEFT HEART CATHETERIZATION WITH CORONARY ANGIOGRAM;  Surgeon: Blane Ohara, MD;  Location: [facility] CATH LAB;  Service: Cardiovascular;  Laterality: N/A;  . Left leg embolectomy  May 2012   Dr. Trula Slade - reason for lifelong coumadin  . PERIPHERAL VASCULAR ATHERECTOMY  10/31/2018   Procedure: PERIPHERAL VASCULAR ATHERECTOMY;  Surgeon: Lorretta Harp, MD;  Location: [place] CV LAB;  Service: Cardiovascular;;  left SFA   Social History   Socioeconomic History  . Marital status: Single    Spouse name: Not on file  . Number of children: 2  . Years of education: Not on file  . Highest education level:  11th grade  Occupational History  . Occupation: disability  Tobacco Use  . Smoking status: Former Smoker    Packs/day: 0.50    Years: 3.00    Pack years: 1.50    Types: Cigarettes    Start date: 10/12/1965    Quit date: 02/19/1970    Years since quitting: 49.7  . Smokeless tobacco: Never Used  Substance and Sexual Activity  . Alcohol use: Not Currently  . Drug use: Yes    Types: Marijuana    Comment: pt smokes weed once in the morning and once in the evening  . Sexual activity: Not Currently  Other Topics  Concern  . Not on file  Social History Narrative  . Not on file   Social Determinants of Health   Financial Resource Strain:   . Difficulty of Paying Living Expenses: Not on file  Food Insecurity:   . Worried About Charity fundraiser in the Last Year: Not on file  . Ran Out of Food in the Last Year: Not on file  Transportation Needs:   . Lack of Transportation (Medical): Not on file  . Lack of Transportation (Non-Medical): Not on file  Physical Activity:   . Days of Exercise per Week: Not on file  . Minutes of Exercise per Session: Not on file  Stress:   . Feeling of Stress : Not on file  Social Connections:   . Frequency of Communication with Friends and Family: Not on file  . Frequency of Social Gatherings with Friends and Family: Not on file  . Attends Religious Services: Not on file  . Active Member of Clubs or Organizations: Not on file  . Attends Archivist Meetings: Not on file  . Marital Status: Not on file   Family History  Problem Relation Age of Onset  . Diabetes Mother   . Cirrhosis Father   . COPD Sister    No Known Allergies Prior to Admission medications   Medication Sig Start Date End Date Taking? Authorizing Provider  aspirin EC 81 MG tablet Take 1 tablet (81 mg total) by mouth daily. 11/01/17  Yes [name], MD  atorvastatin (LIPITOR) 40 MG tablet Take 1 tablet by mouth once daily Patient taking differently: Take 40 mg by mouth daily.  08/29/19  Yes [name], MD  cholecalciferol (VITAMIN D) 1000 units tablet Take 1 tablet (1,000 Units total) by mouth daily. 11/01/17  Yes [name], MD  clopidogrel (PLAVIX) 75 MG tablet Take 1 tablet by mouth once daily Patient taking differently: Take 75 mg by mouth daily.  08/29/19  Yes [name], MD  fluticasone (FLONASE) 50 MCG/ACT nasal spray Place 2 sprays into both nostrils daily. Patient taking differently: Place 2 sprays into both nostrils daily as needed for  allergies.  03/13/19  Yes [name], MD  furosemide (LASIX) 40 MG tablet Take 1 tablet by mouth once daily Patient taking differently: Take 40 mg by mouth daily.  08/29/19  Yes [name], MD  lisinopril (ZESTRIL) 20 MG tablet Take 1 tablet by mouth once daily Patient taking differently: Take 20 mg by mouth daily.  08/29/19  Yes [name], MD  metoprolol succinate (TOPROL-XL) 50 MG 24 hr tablet TAKE 1 TABLET BY MOUTH ONCE DAILY WITH OR IMMEDIATELY FOLLOWING A MEAL Patient taking differently: Take 50 mg by mouth daily.  08/29/19  Yes [name], MD  montelukast (SINGULAIR) 10 MG tablet TAKE 1 TABLET BY MOUTH  AT BEDTIME Patient taking differently: Take 10 mg by mouth at bedtime.  08/29/19  Yes [name], MD  nitroGLYCERIN (NITROSTAT) 0.4 MG SL tablet DISSOLVE ONE TABLET UNDER THE TONGUE EVERY 5 MINUTES AS NEEDED FOR CHEST PAIN.  DO NOT EXCEED A TOTAL OF 3 DOSES IN 15 MINUTES Patient taking differently: Place 0.4 mg under the tongue every 5 (five) minutes as needed for chest pain.  09/23/17  Yes Satira Sark, MD  trolamine salicylate (ASPERCREME) 10 % cream Apply 1 application topically as needed for muscle pain.   Yes [provider]     Positive ROS: Otherwise negative  All other systems have been reviewed and were otherwise negative with the exception of those mentioned in the HPI and as above.  Physical Exam: Vitals:   11/21/19 0816 11/21/19 0845  BP: (!) 177/42 (!) 188/47  Pulse: 60 (!) 52  Resp: 17   Temp: 98.2 F (36.8 C)   SpO2: 100%     General: Alert, no acute distress Oral: Normal oral mucosa and tonsils.  Nasal endoscopy through the left nostril reveals a clear nasopharynx, base of tongue vallecula and epiglottis were normal.  Piriform sinuses were clear.  Vocal cords were normal bilaterally. Nasal: Clear nasal passages Neck: Patient has several easily palpable left supraclavicular lymphadenopathy just behind the left  sternocleidomastoid muscle the largest measuring approximately 2 cm in size. Ear: Ear canal is clear with normal appearing TMs Cardiovascular: Regular rate and rhythm, no murmur.  Respiratory: Clear to auscultation Neurologic: Alert and oriented x 3   Assessment/Plan: LYMPHADENOPATHY Plan for Procedure(s): LYMPH NODE BIOPSY OF NECK   Melony Overly, MD 11/21/2019 9:43 AM

## 2019-11-21 NOTE — Transfer of Care (Signed)
Immediate Anesthesia Transfer of Care Note  Patient: [name]  Procedure(s) Performed: Left Neck LYMPH NODE BIOPSY (Left Neck)  Patient Location: PACU  Anesthesia Type:General  Level of Consciousness: awake, alert  and oriented  Airway & Oxygen Therapy: Patient Spontanous Breathing  Post-op Assessment: Report given to RN and Post -op Vital signs reviewed and stable  Post vital signs: Reviewed and stable  Last Vitals:  Vitals Value Taken Time  BP 137/57   Temp    Pulse 58   Resp 16   SpO2 100     Last Pain:  Vitals:   11/21/19 0836  TempSrc:   PainSc: 0-No pain         Complications: No apparent anesthesia complications

## 2019-11-22 ENCOUNTER — Ambulatory Visit (HOSPITAL_COMMUNITY): Admission: RE | Admit: 2019-11-22 | Payer: Medicare Other | Source: Ambulatory Visit

## 2019-11-22 ENCOUNTER — Other Ambulatory Visit: Payer: Self-pay

## 2019-11-24 LAB — SURGICAL PATHOLOGY

## 2019-11-28 ENCOUNTER — Ambulatory Visit (HOSPITAL_COMMUNITY): Payer: Medicare Other | Admitting: Hematology

## 2019-12-07 ENCOUNTER — Telehealth: Payer: Medicare Other | Admitting: Cardiovascular Disease

## 2019-12-07 ENCOUNTER — Encounter: Payer: Self-pay | Admitting: Cardiovascular Disease

## 2019-12-07 ENCOUNTER — Telehealth (INDEPENDENT_AMBULATORY_CARE_PROVIDER_SITE_OTHER): Payer: Medicare Other | Admitting: Cardiovascular Disease

## 2019-12-07 VITALS — Ht 64.0 in | Wt 118.0 lb

## 2019-12-07 DIAGNOSIS — I739 Peripheral vascular disease, unspecified: Secondary | ICD-10-CM

## 2019-12-07 DIAGNOSIS — Z9862 Peripheral vascular angioplasty status: Secondary | ICD-10-CM

## 2019-12-07 NOTE — Progress Notes (Signed)
Virtual Visit via Telephone Note   This visit type was conducted due to national recommendations for restrictions regarding the COVID-19 Pandemic (e.g. social distancing) in an effort to limit this patient's exposure and mitigate transmission in our community.  Due to his co-morbid illnesses, this patient is at least at moderate risk for complications without adequate follow up.  This format is felt to be most appropriate for this patient at this time.  The patient did not have access to video technology/had technical difficulties with video requiring transitioning to audio format only (telephone).  All issues noted in this document were discussed and addressed.  No physical exam could be performed with this format.  Please refer to the patient's chart for his  consent to telehealth for [facility].   The patient was identified using 2 identifiers.  Date:  12/07/2019   ID:  [name], DOB 19-Jan-1951, MRN [id number]  Patient Location: Home Provider Location: Home  PCP:  [name], MD  Cardiologist:  Rozann Lesches, MD  Peripheral vascular cardiologist-Dr. Quay Burow Electrophysiologist:  None   Evaluation Performed:  Follow-Up Visit  Chief Complaint: Peripheral arterial disease  History of Present Illness:    [name] is a 69 y.o.  thin and chronically ill-appearing divorced African-American male father 2 children who was referred by Dr. Jacqualyn Posey, his podiatrist, for peripheral vascular valuation.  I last saw him in the office 12/06/2018.Dr. Domenic Polite is his cardiologist in [place] . He has a history of CAD status post PCI of his RCA 08/10/2013. His last cath performed by Dr. Burt Knack 08/10/2013. He has had left leg embolectomy by Dr. Trula Slade May 2012. His other problems include treated hypertension. He smoked remotely. He has had aortic valve replacement at [facility] and has severe aortic insufficiency. He has been on Coumadin in the  past but not currently. He denies chest pain but does get short of breath.  I performed Doppler studies on him revealing bilateral SFA and tibial vessel disease.  Ultimately underwent angiography and left SFA intervention by myself 10/31/2018 using a [facility] 1 directional atherectomy followed by drug-coated balloon angioplasty.  Follow-up Dopplers performed 11/22/2018 revealed improvement in his left ABI of .98 with widely patent left SFA.  His left lower extremity claudication symptoms have resolved.  Since I saw him a year ago he did have Doppler studies performed 05/31/2019 revealing a left ankle-brachial index of 0.95 with a widely patent artery.  He currently denies claudication.  He also denies chest pain or shortness of breath.  The patient does not have symptoms concerning for COVID-19 infection (fever, chills, cough, or new shortness of breath).    Past Medical History:  Diagnosis Date  . Arterial embolus of lower extremity ([place]) 11/23/2013  . Arthritis   . CAD (coronary artery disease)    a. s/p PCI with 3.5 x 18 BMS to mid RCA, non obst LAD, Lcx patent 08/10/13  . Embolus of femoral artery [facility]) 2012   Surgical repair, previously on Coumadin  . Essential hypertension, benign   . ETOH abuse   . History of DVT (deep vein thrombosis) 2011  . History of GI bleed    February 2012   . Ingrown left greater toenail 09/04/2018  . 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 Prosthetic valve endocarditis ([place])   . PVD (  peripheral vascular disease) ([place])   . S/P aortic valve replacement    Details not clear -  reportedly 1971 per patient   Past Surgical History:  Procedure Laterality Date  . ABDOMINAL AORTOGRAM W/LOWER EXTREMITY N/A 10/31/2018   Procedure: ABDOMINAL AORTOGRAM W/LOWER EXTREMITY;  Surgeon: Lorretta Harp, MD;  Location: [place] CV LAB;  Service: Cardiovascular;   Laterality: N/A;  . Aoric valve replacement  January 2005   65mm Medtronic Freestyle stentless porcine bioprosthesis [facility])  . CARDIAC CATHETERIZATION     30-40% mid LAD, 20-30% ostial D1, proximal 30-40% RCA, 95% mid RCA thrombus, 40% distal stenosis. EF 45%, inferior HK.  [name] COLONOSCOPY N/A 04/20/2014   Procedure: COLONOSCOPY;  Surgeon: Rogene Houston, MD;  [facility];  Service: Endoscopy;  Laterality: N/A;  930  . CORONARY ANGIOPLASTY WITH STENT PLACEMENT  07/2013  . INGUINAL HERNIA REPAIR Bilateral 04/17/2019   Procedure: ATTEMPTED LAPAROSCOPIC BILATERAL INGUINAL HERNIA REPAIR;  Surgeon: Coralie Keens, MD;  Location: [place];  Service: General;  Laterality: Bilateral;  . INGUINAL HERNIA REPAIR Bilateral 04/17/2019   Procedure: Hernia Repair Inguinal Adult;  Surgeon: Coralie Keens, MD;  Location: [place];  Service: General;  Laterality: Bilateral;  . LEFT HEART CATHETERIZATION WITH CORONARY ANGIOGRAM N/A 08/10/2013   Procedure: LEFT HEART CATHETERIZATION WITH CORONARY ANGIOGRAM;  Surgeon: Blane Ohara, MD;  Location: [facility] CATH LAB;  Service: Cardiovascular;  Laterality: N/A;  . Left leg embolectomy  May 2012   Dr. Trula Slade - reason for lifelong coumadin  . LYMPH NODE BIOPSY Left 11/21/2019   Procedure: Left Neck LYMPH NODE BIOPSY;  Surgeon: Rozetta Nunnery, MD;  Location: [place];  Service: ENT;  Laterality: Left;  . PERIPHERAL VASCULAR ATHERECTOMY  10/31/2018   Procedure: PERIPHERAL VASCULAR ATHERECTOMY;  Surgeon: Lorretta Harp, MD;  Location: [place] CV LAB;  Service: Cardiovascular;;  left SFA     No outpatient medications have been marked as taking for the 12/07/19 encounter (Appointment) with Lorretta Harp, MD.     Allergies:   Patient has no known allergies.   Social History   Tobacco Use  . Smoking status: Former Smoker    Packs/day: 0.50    Years: 3.00    Pack years: 1.50    Types: Cigarettes    Start date: 10/12/1965    Quit date: 02/19/1970     Years since quitting: 49.8  . Smokeless tobacco: Never Used  Substance Use Topics  . Alcohol use: Not Currently  . Drug use: Yes    Types: Marijuana    Comment: pt smokes weed once in the morning and once in the evening     Family Hx: The patient's family history includes COPD in his sister; Cirrhosis in his father; Diabetes in his mother.  ROS:   Please see the history of present illness.     All other systems reviewed and are negative.   Prior CV studies:   The following studies were reviewed today:  Lower extremity arterial Doppler studies performed 05/31/2019  Labs/Other Tests and Data Reviewed:    EKG:  No ECG reviewed.  Recent Labs: 09/12/2019: TSH 0.45 11/17/2019: ALT 12; BUN 15; Creatinine, Ser 0.99; Hemoglobin 8.3; Platelets 273; Potassium 4.4; Sodium 135   Recent Lipid Panel Lab Results  Component Value Date/Time   CHOL 112 07/06/2019 08:43 AM   TRIG 52 07/06/2019 08:43 AM   HDL 42 07/06/2019 08:43 AM   CHOLHDL 2.7 07/06/2019 08:43 AM   LDLCALC 57 07/06/2019  08:43 AM    Wt Readings from Last 3 Encounters:  11/21/19 118 lb (53.5 kg)  11/17/19 118 lb (53.5 kg)  11/06/19 114 lb 3.2 oz (51.8 kg)     Objective:    Vital Signs:  There were no vitals taken for this visit.   VITAL SIGNS:  reviewed a complete physical exam was not performed today since this was a virtual telemedicine phone visit  ASSESSMENT & PLAN:    1. Peripheral arterial disease-history of symptomatic PAD status post angiography 10/31/2018 revealing 95% mid SFA disease bilaterally with one-vessel runoff on the left and 0 on the right.  I performed [facility] 1 directional atherectomy followed by drug-coated balloon angioplasty of the mid left SFA stenosis.  His left ankle-brachial index normalized and his claudication on that side resolved.  He continues to deny claudication.  His most recent Doppler studies performed 05/31/2019 revealed a left ABI of 0.95 with a widely patent SFA.  COVID-19  Education: The signs and symptoms of COVID-19 were discussed with the patient and how to seek care for testing (follow up with PCP or arrange E-visit).  The importance of social distancing was discussed today.  Time:   Today, I have spent 5 minutes with the patient with telehealth technology discussing the above problems.     Medication Adjustments/Labs and Tests Ordered: Current medicines are reviewed at length with the patient today.  Concerns regarding medicines are outlined above.   Tests Ordered: No orders of the defined types were placed in this encounter.   Medication Changes: No orders of the defined types were placed in this encounter.   Follow Up:  In Person in 1 month(s)  Signed, Quay Burow, MD  12/07/2019 12:03 PM    [place]

## 2019-12-07 NOTE — Patient Instructions (Signed)
Medication Instructions:  Continue current medications  *If you need a refill on your cardiac medications before your next appointment, please call your pharmacy*   Lab Work: None Ordered   Testing/Procedures: Your physician has requested that you have a lower extremity arterial duplex. This test is an ultrasound of the arteries in the legs or arms. It looks at arterial blood flow in the legs and arms. Allow one hour for Lower and Upper Arterial scans. There are no restrictions or special instructions    Follow-Up: At [facility], you and your health needs are our priority.  As part of our continuing mission to provide you with exceptional heart care, we have created designated Provider Care Teams.  These Care Teams include your primary Cardiologist (physician) and Advanced Practice Providers (APPs -  Physician Assistants and Nurse Practitioners) who all work together to provide you with the care you need, when you need it.  We recommend signing up for the patient portal called "MyChart".  Sign up information is provided on this After Visit Summary.  MyChart is used to connect with patients for Virtual Visits (Telemedicine).  Patients are able to view lab/test results, encounter notes, upcoming appointments, etc.  Non-urgent messages can be sent to your provider as well.   To learn more about what you can do with MyChart, go to [email or web address].    Your next appointment:   1 year(s)  The format for your next appointment:   In Person  Provider:   You may see Quay Burow, MD or one of the following Advanced Practice Providers on your designated Care Team:    Kerin Ransom, PA-C  [place], [place]  Coletta Memos, [place]

## 2019-12-20 ENCOUNTER — Other Ambulatory Visit: Payer: Self-pay | Admitting: Family Medicine

## 2019-12-20 ENCOUNTER — Other Ambulatory Visit: Payer: Self-pay | Admitting: Adult Health

## 2019-12-20 ENCOUNTER — Encounter: Payer: Self-pay | Admitting: Family Medicine

## 2019-12-20 ENCOUNTER — Ambulatory Visit (INDEPENDENT_AMBULATORY_CARE_PROVIDER_SITE_OTHER): Payer: Medicare Other | Admitting: Family Medicine

## 2019-12-20 ENCOUNTER — Other Ambulatory Visit: Payer: Self-pay

## 2019-12-20 VITALS — BP 136/60 | HR 83 | Temp 97.3°F | Resp 15 | Ht 64.0 in | Wt 115.0 lb

## 2019-12-20 DIAGNOSIS — F320 Major depressive disorder, single episode, mild: Secondary | ICD-10-CM | POA: Diagnosis not present

## 2019-12-20 DIAGNOSIS — R450 Nervousness: Secondary | ICD-10-CM | POA: Diagnosis not present

## 2019-12-20 HISTORY — DX: Major depressive disorder, single episode, mild: F32.0

## 2019-12-20 MED ORDER — ESCITALOPRAM OXALATE 10 MG PO TABS: 10.0000 mg | ORAL_TABLET | Freq: Every day | ORAL | 1 refills | Status: AC

## 2019-12-20 MED ORDER — BUSPIRONE HCL 10 MG PO TABS: 10.0000 mg | ORAL_TABLET | Freq: Two times a day (BID) | ORAL | 2 refills | Status: AC

## 2019-12-20 NOTE — Progress Notes (Signed)
Subjective:  Patient ID: [name], male    DOB: 1950/12/07  Age: 69 y.o. MRN: [id number]  CC:  Chief Complaint  Patient presents with  . Depression  . Anxiety      HPI  HPI  [name] is a 69 year old male patient of Dr. Griffin Dakin.  Who presents today for follow-up secondary to having had lymph node biopsies.  Recent diagnosis of cancer and several of her health conditions that he has been dealing with over the past.  Today he reports that he can no longer handle his anxiety and stress.  And just constantly feels like he is anxious.  He reports that he has been smoking marijuana to help with his eating and overall how he feels.  But he does not want to smoke every day.  He does not want to be on any stronger controlled substances if able with this.  But is open to starting the medication.  Today patient denies signs and symptoms of COVID 19 infection including fever, chills, cough, shortness of breath, and headache. Past Medical, Surgical, Social History, Allergies, and Medications have been Reviewed.   Past Medical History:  Diagnosis Date  . Arterial embolus of lower extremity ([place]) 11/23/2013  . Arthritis   . CAD (coronary artery disease)    a. s/p PCI with 3.5 x 18 BMS to mid RCA, non obst LAD, Lcx patent 08/10/13  . Depression, major, single episode, mild ([place]) 12/20/2019  . Embolus of femoral artery [facility]) 2012   Surgical repair, previously on Coumadin  . Essential hypertension, benign   . ETOH abuse   . History of DVT (deep vein thrombosis) 2011  . History of GI bleed    February 2012   . Ingrown left greater toenail 09/04/2018  . 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 Prosthetic valve endocarditis ([place])   . PVD (peripheral vascular disease) ([place])   . S/P aortic valve replacement    Details not clear -  reportedly 1971 per patient     Current Meds  Medication Sig  . aspirin EC 81 MG tablet Take 1 tablet (81 mg total) by mouth daily.  [name] atorvastatin (LIPITOR) 40 MG tablet Take 1 tablet by mouth once daily (Patient taking differently: Take 40 mg by mouth daily. )  . cholecalciferol (VITAMIN D) 1000 units tablet Take 1 tablet (1,000 Units total) by mouth daily.  . clopidogrel (PLAVIX) 75 MG tablet Take 1 tablet by mouth once daily (Patient taking differently: Take 75 mg by mouth daily. )  . fluticasone (FLONASE) 50 MCG/ACT nasal spray Place 2 sprays into both nostrils daily. (Patient taking differently: Place 2 sprays into both nostrils daily as needed for allergies. )  . furosemide (LASIX) 40 MG tablet Take 1 tablet by mouth once daily (Patient taking differently: Take 40 mg by mouth daily. )  . lisinopril (ZESTRIL) 20 MG tablet Take 1 tablet by mouth once daily (Patient taking differently: Take 20 mg by mouth daily. )  . metoprolol succinate (TOPROL-XL) 50 MG 24 hr tablet TAKE 1 TABLET BY MOUTH ONCE DAILY WITH OR IMMEDIATELY FOLLOWING A MEAL (Patient taking differently: Take 50 mg by mouth daily. )  . montelukast (SINGULAIR) 10 MG tablet TAKE 1 TABLET BY MOUTH AT BEDTIME (Patient taking differently: Take 10 mg by mouth at bedtime. )  . nitroGLYCERIN (NITROSTAT)  0.4 MG SL tablet DISSOLVE ONE TABLET UNDER THE TONGUE EVERY 5 MINUTES AS NEEDED FOR CHEST PAIN.  DO NOT EXCEED A TOTAL OF 3 DOSES IN 15 MINUTES (Patient taking differently: Place 0.4 mg under the tongue every 5 (five) minutes as needed for chest pain. )  . trolamine salicylate (ASPERCREME) 10 % cream Apply 1 application topically as needed for muscle pain.    ROS:  Review of Systems  Constitutional: Negative.   HENT: Negative.   Eyes: Negative.   Respiratory: Negative.   Cardiovascular: Negative.   Gastrointestinal: Negative.   Genitourinary: Negative.   Musculoskeletal: Negative.   Skin: Negative.   Neurological: Negative.   Endo/Heme/Allergies: Negative.    Psychiatric/Behavioral: Positive for depression. The patient is nervous/anxious.   All other systems reviewed and are negative.    Objective:   Today's Vitals: BP 136/60   Pulse 83   Temp (!) 97.3 F (36.3 C) (Temporal)   Resp 15   Ht 5\' 4"  (1.626 m)   Wt 115 lb (52.2 kg)   SpO2 97%   BMI 19.74 kg/m  Vitals with BMI 12/20/2019 12/07/2019 11/21/2019  Height 5\' 4"  5\' 4"  -  Weight 115 lbs 118 lbs -  BMI [id number] -  Systolic XX123456 - 123456  Diastolic 60 - 54  Pulse 83 - 62     Physical Exam Vitals and nursing note reviewed.  Constitutional:      Appearance: Normal appearance. He is well-developed and well-groomed. He is obese.  HENT:     Head: Normocephalic and atraumatic.     Right Ear: External ear normal.     Left Ear: External ear normal.     Mouth/Throat:     Comments: Mask in place  Eyes:     General:        Right eye: No discharge.        Left eye: No discharge.     Conjunctiva/sclera: Conjunctivae normal.  Cardiovascular:     Rate and Rhythm: Normal rate and regular rhythm.     Pulses: Normal pulses.     Heart sounds: Murmur present.  Pulmonary:     Effort: Pulmonary effort is normal.     Breath sounds: Normal breath sounds.  Musculoskeletal:        General: Normal range of motion.     Cervical back: Normal range of motion and neck supple.  Skin:    General: Skin is warm.  Neurological:     General: No focal deficit present.     Mental Status: He is alert and oriented to person, place, and time.  Psychiatric:        Attention and Perception: Attention normal.        Mood and Affect: Mood is anxious and depressed.        Speech: Speech normal.        Behavior: Behavior is hyperactive. Behavior is cooperative.        Thought Content: Thought content normal.        Cognition and Memory: Cognition normal.        Judgment: Judgment normal.     Comments: Pacing back and forth in room Tearful when talking about depression and anxiety    Depression screen  [facility] 2/9 12/20/2019 10/17/2019 09/12/2019  Decreased Interest 1 0 0  Down, Depressed, Hopeless 1 0 0  PHQ - 2 Score 2 0 0  Altered sleeping 3 - -  Tired, decreased energy 0 - -  Change in appetite  0 - -  Feeling bad or failure about yourself  1 - -  Trouble concentrating - - -  Moving slowly or fidgety/restless 1 - -  Suicidal thoughts 0 - -  PHQ-9 Score 7 - -  Difficult doing work/chores Somewhat difficult - -  Some recent data might be hidden    GAD 7 : Generalized Anxiety Score 12/20/2019 12/20/2019  Nervous, Anxious, on Edge 3 3  Control/stop worrying 3 3  Worry too much - different things 3 3  Trouble relaxing 3 3  Restless 2 3  Easily annoyed or irritable 1 1  Afraid - awful might happen 0 0  Total GAD 7 Score 15 16  Anxiety Difficulty Very difficult Very difficult      Assessment   1. Depression, major, single episode, mild ([place])   2. Nervously anxious     Tests ordered No orders of the defined types were placed in this encounter.   Plan: Please see assessment and plan per problem list above.   Meds ordered this encounter  Medications  . escitalopram (LEXAPRO) 10 MG tablet    Sig: Take 1 tablet (10 mg total) by mouth daily.    Dispense:  30 tablet    Refill:  1    Order Specific Question:   Supervising Provider    Answer:   SIMPSON, MARGARET E [id number]  . busPIRone (BUSPAR) 10 MG tablet    Sig: Take 1 tablet (10 mg total) by mouth 2 (two) times daily.    Dispense:  60 tablet    Refill:  2    Order Specific Question:   Supervising Provider    Answer:   [name] [id number]    Patient to follow-up in 6 weeks   Perlie Mayo, NP

## 2019-12-20 NOTE — Telephone Encounter (Signed)
Rx request sent to pharmacy.  

## 2019-12-20 NOTE — Assessment & Plan Note (Signed)
PHQ 7 Denies SI and HI today. Starting Lexapro daily. Close follow up 6 weeks. Advised to call if symptoms worsen. Does have buspar to help daily as well.  Reviewed side effects, risks and benefits of medication.   Patient acknowledged agreement and understanding of the plan.

## 2019-12-20 NOTE — Assessment & Plan Note (Signed)
GAD elevated at 15/16. Denies SI and HI Starting daily buspar and lexapro Close follow up 6 weeks Advised to call back if symptoms worsen and meds cause concern. Reviewed side effects, risks and benefits of medication.   Patient acknowledged agreement and understanding of the plan.

## 2019-12-20 NOTE — Patient Instructions (Addendum)
I appreciate the opportunity to provide you with care for your health and wellness. Today we discussed: anxiety  Follow up: 6 weeks for mood follow up   No labs or referrals today  Start taking your anxiety/depression medication daily.  Lexapro daily in the morning or night (just take at same time daily)  Buspar take one in the morning and one in the evening.  Please continue to practice social distancing to keep you, your family, and our community safe.  If you must go out, please wear a mask and practice good handwashing.  It was a pleasure to see you and I look forward to continuing to work together on your health and well-being. Please do not hesitate to call the office if you need care or have questions about your care.  Have a wonderful day and week. With Gratitude, Cherly Beach, DNP, AGNP-BC

## 2019-12-25 ENCOUNTER — Telehealth: Payer: Self-pay

## 2019-12-25 NOTE — Telephone Encounter (Signed)
  Patient Consent for Virtual Visit         [name] has provided verbal consent on 12/25/2019 for a virtual visit (video or telephone).   CONSENT FOR VIRTUAL VISIT FOR:  [name]  By participating in this virtual visit I agree to the following:  I hereby voluntarily request, consent and authorize [facility] and its employed or contracted physicians, physician assistants, nurse practitioners or other licensed health care professionals (the Practitioner), to provide me with telemedicine health care services (the "Services") as deemed necessary by the treating Practitioner. I acknowledge and consent to receive the Services by the Practitioner via telemedicine. I understand that the telemedicine visit will involve communicating with the Practitioner through live audiovisual communication technology and the disclosure of certain medical information by electronic transmission. I acknowledge that I have been given the opportunity to request an in-person assessment or other available alternative prior to the telemedicine visit and am voluntarily participating in the telemedicine visit.  I understand that I have the right to withhold or withdraw my consent to the use of telemedicine in the course of my care at any time, without affecting my right to future care or treatment, and that the Practitioner or I may terminate the telemedicine visit at any time. I understand that I have the right to inspect all information obtained and/or recorded in the course of the telemedicine visit and may receive copies of available information for a reasonable fee.  I understand that some of the potential risks of receiving the Services via telemedicine include:  [name] Delay or interruption in medical evaluation due to technological equipment failure or disruption; . Information transmitted may not be sufficient (e.g. poor resolution of images) to allow for appropriate medical decision making by the  Practitioner; and/or  . In rare instances, security protocols could fail, causing a breach of personal health information.  Furthermore, I acknowledge that it is my responsibility to provide information about my medical history, conditions and care that is complete and accurate to the best of my ability. I acknowledge that Practitioner's advice, recommendations, and/or decision may be based on factors not within their control, such as incomplete or inaccurate data provided by me or distortions of diagnostic images or specimens that may result from electronic transmissions. I understand that the practice of medicine is not an exact science and that Practitioner makes no warranties or guarantees regarding treatment outcomes. I acknowledge that a copy of this consent can be made available to me via my patient portal ([facility]), or I can request a printed copy by calling the office of [facility].    I understand that my insurance will be billed for this visit.   I have read or had this consent read to me. . I understand the contents of this consent, which adequately explains the benefits and risks of the Services being provided via telemedicine.  . I have been provided ample opportunity to ask questions regarding this consent and the Services and have had my questions answered to my satisfaction. . I give my informed consent for the services to be provided through the use of telemedicine in my medical care

## 2020-01-01 ENCOUNTER — Telehealth: Payer: Medicare Other | Admitting: Cardiology

## 2020-01-05 ENCOUNTER — Ambulatory Visit (HOSPITAL_COMMUNITY)
Admission: RE | Admit: 2020-01-05 | Discharge: 2020-01-05 | Disposition: A | Discharge status: Home / Self Care | Payer: Medicare Other | Source: Ambulatory Visit | Attending: Cardiology | Admitting: Cardiology

## 2020-01-05 ENCOUNTER — Other Ambulatory Visit: Payer: Self-pay

## 2020-01-05 DIAGNOSIS — I359 Nonrheumatic aortic valve disorder, unspecified: Secondary | ICD-10-CM

## 2020-01-05 NOTE — Progress Notes (Signed)
*  PRELIMINARY RESULTS* Echocardiogram 2D Echocardiogram has been performed.  Leavy Cella 01/05/2020, 10:37 AM

## 2020-01-10 ENCOUNTER — Encounter: Payer: Self-pay | Admitting: Cardiology

## 2020-01-10 NOTE — Progress Notes (Signed)
Virtual Visit via Telephone Note   This visit type was conducted due to national recommendations for restrictions regarding the COVID-19 Pandemic (e.g. social distancing) in an effort to limit this patient's exposure and mitigate transmission in our community.  Due to his co-morbid illnesses, this patient is at least at moderate risk for complications without adequate follow up.  This format is felt to be most appropriate for this patient at this time.  The patient did not have access to video technology/had technical difficulties with video requiring transitioning to audio format only (telephone).  All issues noted in this document were discussed and addressed.  No physical exam could be performed with this format.  Please refer to the patient's chart for his  consent to telehealth for [facility].   The patient was identified using 2 identifiers.  Date:  01/11/2020   ID:  [name], DOB 29-Aug-1951, MRN [id number]  Patient Location: Home Provider Location: Home  PCP:  [name], MD  Cardiologist:  Rozann Lesches, MD Electrophysiologist:  None   Evaluation Performed:  Follow-Up Visit  Chief Complaint:  Cardiac follow-up  History of Present Illness:    [name] is a 69 y.o. male last seen in November 2020.  We spoke by phone today.  He does not report any change in shortness of breath at baseline, currently NYHA class II.  No active angina symptoms or palpitations.  I reviewed his recent echocardiogram results as outlined below.  W continue observation at this point.  Follow-up visit noted with Dr. Gwenlyn Found in March, I reviewed the note.  I reviewed his current cardiac regimen which is outlined below.  He reports compliance.  No bleeding problems on aspirin and Plavix.   Past Medical History:  Diagnosis Date  . Arterial embolus of lower extremity ([place]) 11/23/2013  . Arthritis   . CAD (coronary artery disease)    a. s/p PCI with 3.5 x 18 BMS to mid RCA,  non obst LAD, Lcx patent 08/10/13  . Depression, major, single episode, mild ([place]) 12/20/2019  . Embolus of femoral artery [facility]) 2012   Surgical repair, previously on Coumadin  . Essential hypertension   . ETOH abuse   . History of DVT (deep vein thrombosis) 2011  . History of GI bleed    February 2012   . Ingrown left greater toenail 09/04/2018  . 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 Prosthetic valve endocarditis ([place])   . PVD (peripheral vascular disease) ([place])   . S/P aortic valve replacement    Details not clear -  reportedly 1971 per patient   Past Surgical History:  Procedure Laterality Date  . ABDOMINAL AORTOGRAM W/LOWER EXTREMITY N/A 10/31/2018   Procedure: ABDOMINAL AORTOGRAM W/LOWER EXTREMITY;  Surgeon: Lorretta Harp, MD;  Location: [place] CV LAB;  Service: Cardiovascular;  Laterality: N/A;  . Aoric valve replacement  January 2005   46mm Medtronic Freestyle stentless porcine bioprosthesis [facility])  . CARDIAC CATHETERIZATION     30-40% mid LAD, 20-30% ostial D1, proximal 30-40% RCA, 95% mid RCA thrombus, 40% distal stenosis. EF 45%, inferior HK.  Bruce Kitchen COLONOSCOPY N/A 04/20/2014   Procedure: COLONOSCOPY;  Surgeon: Rogene Houston, MD;  [facility];  Service: Endoscopy;  Laterality: N/A;  930  . CORONARY ANGIOPLASTY WITH STENT PLACEMENT  07/2013  . INGUINAL HERNIA REPAIR  Bilateral 04/17/2019   Procedure: ATTEMPTED LAPAROSCOPIC BILATERAL INGUINAL HERNIA REPAIR;  Surgeon: Coralie Keens, MD;  Location: [facility];  Service: General;  Laterality: Bilateral;  . INGUINAL HERNIA REPAIR Bilateral 04/17/2019   Procedure: Hernia Repair Inguinal Adult;  Surgeon: Coralie Keens, MD;  Location: [place];  Service: General;  Laterality: Bilateral;  . LEFT HEART CATHETERIZATION WITH CORONARY ANGIOGRAM N/A 08/10/2013   Procedure: LEFT HEART CATHETERIZATION WITH  CORONARY ANGIOGRAM;  Surgeon: Blane Ohara, MD;  Location: [facility] CATH LAB;  Service: Cardiovascular;  Laterality: N/A;  . Left leg embolectomy  May 2012   Dr. Trula Slade - reason for lifelong coumadin  . LYMPH NODE BIOPSY Left 11/21/2019   Procedure: Left Neck LYMPH NODE BIOPSY;  Surgeon: Rozetta Nunnery, MD;  Location: [place];  Service: ENT;  Laterality: Left;  . PERIPHERAL VASCULAR ATHERECTOMY  10/31/2018   Procedure: PERIPHERAL VASCULAR ATHERECTOMY;  Surgeon: Lorretta Harp, MD;  Location: [place] CV LAB;  Service: Cardiovascular;;  left SFA     Current Meds  Medication Sig  . aspirin EC 81 MG tablet Take 1 tablet (81 mg total) by mouth daily.  Bruce Kitchen atorvastatin (LIPITOR) 40 MG tablet Take 1 tablet by mouth once daily  . busPIRone (BUSPAR) 10 MG tablet Take 1 tablet (10 mg total) by mouth 2 (two) times daily.  . cholecalciferol (VITAMIN D) 1000 units tablet Take 1 tablet (1,000 Units total) by mouth daily.  . clopidogrel (PLAVIX) 75 MG tablet Take 1 tablet by mouth once daily (Patient taking differently: Take 75 mg by mouth daily. )  . escitalopram (LEXAPRO) 10 MG tablet Take 1 tablet (10 mg total) by mouth daily.  . fluticasone (FLONASE) 50 MCG/ACT nasal spray Place 2 sprays into both nostrils daily. (Patient taking differently: Place 2 sprays into both nostrils daily as needed for allergies. )  . furosemide (LASIX) 40 MG tablet Take 1 tablet by mouth once daily  . lisinopril (ZESTRIL) 20 MG tablet Take 1 tablet by mouth once daily  . metoprolol succinate (TOPROL-XL) 50 MG 24 hr tablet TAKE 1 TABLET BY MOUTH ONCE DAILY WITH  OR  IMMEDIATELY  FOLLOWING  A  MEAL  . montelukast (SINGULAIR) 10 MG tablet TAKE 1 TABLET BY MOUTH AT BEDTIME  . nitroGLYCERIN (NITROSTAT) 0.4 MG SL tablet Place 1 tablet (0.4 mg total) under the tongue every 5 (five) minutes as needed for chest pain.  Bruce Kitchen trolamine salicylate (ASPERCREME) 10 % cream Apply 1 application topically as needed for muscle pain.      Allergies:   Patient has no known allergies.   ROS:   Frequent urination and nocturia.  Concerned that it may be related to Flomax which he is taking per PCP.  Prior CV studies:   The following studies were reviewed today:  Echocardiogram 01/05/2020: 1. Left ventricular ejection fraction, by estimation, is 45 to 50%. The  left ventricle has mildly decreased function. The left ventricle  demonstrates regional wall motion abnormalities (see scoring  diagram/findings for description). The left ventricular  internal cavity size was mildly dilated. There is mild left ventricular  hypertrophy. Left ventricular diastolic parameters are consistent with  Grade I diastolic dysfunction (impaired relaxation).  2. Right ventricular systolic function is normal. The right ventricular  size is normal. There is mildly elevated pulmonary artery systolic  pressure. The estimated right ventricular systolic pressure is 123456 mmHg.  3. Left atrial size was severely dilated.  4. The mitral valve is grossly normal. Trivial mitral valve  regurgitation.  5. The aortic valve has been repaired/replaced. History of bioprosthetic  AVR with valve in valve TAVR, 26 mm Sapien 3 prosthesis. There is a  moderate perivalvular leak at 9 o'clock. Aortic valve regurgitation is  mild. Aortic valve mean gradient measures  13.7 mmHg.  6. The inferior vena cava is normal in size with greater than 50%  respiratory variability, suggesting right atrial pressure of 3 mmHg.   Labs/Other Tests and Data Reviewed:    EKG:  An ECG dated 12/06/2018 was personally reviewed today and demonstrated:  Sinus bradycardia with prolonged PR interval, left anterior fascicular block, nonspecific ST-T changes laterally.  Recent Labs: 09/12/2019: TSH 0.45 11/17/2019: ALT 12; BUN 15; Creatinine, Ser 0.99; Hemoglobin 8.3; Platelets 273; Potassium 4.4; Sodium 135   Recent Lipid Panel Lab Results  Component Value Date/Time   CHOL 112  07/06/2019 08:43 AM   TRIG 52 07/06/2019 08:43 AM   HDL 42 07/06/2019 08:43 AM   CHOLHDL 2.7 07/06/2019 08:43 AM   LDLCALC 57 07/06/2019 08:43 AM    Wt Readings from Last 3 Encounters:  01/11/20 120 lb (54.4 kg)  12/20/19 115 lb (52.2 kg)  12/07/19 118 lb (53.5 kg)     Objective:    Vital Signs:  Ht 5\' 4"  (1.626 m)   Wt 120 lb (54.4 kg)   BMI 20.60 kg/m    Unable to obtain vital signs today. Patient spoke in full sentences, not short of breath. No audible wheezing or coughing.  ASSESSMENT & PLAN:    1.  Secondary cardiomyopathy, LVEF stable in the range of 45 to 50% by follow-up echocardiogram.  Continue Toprol-XL, lisinopril, and Lasix.  2.  Complex aortic valve disease.  He is status post bioprosthetic AVR and ultimately valve in valve TAVR due to severe aortic regurgitation in the setting of torn prosthetic cusp.  Recent follow-up echocardiogram shows stable valve gradient with moderate perivalvular regurgitation.  3.  CAD status post BMS to the RCA in 2014.  He continues on dual antiplatelet therapy and also Lipitor.  His last LDL was 57.   Time:   Today, I have spent 6 minutes with the patient with telehealth technology discussing the above problems.     Medication Adjustments/Labs and Tests Ordered: Current medicines are reviewed at length with the patient today.  Concerns regarding medicines are outlined above.   Tests Ordered: No orders of the defined types were placed in this encounter.   Medication Changes: No orders of the defined types were placed in this encounter.   Follow Up:  In Person 6 months in the [place] office.  Signed, Rozann Lesches, MD  01/11/2020 9:40 AM    [place]

## 2020-01-11 ENCOUNTER — Telehealth (INDEPENDENT_AMBULATORY_CARE_PROVIDER_SITE_OTHER): Payer: Medicare Other | Admitting: Cardiology

## 2020-01-11 ENCOUNTER — Encounter: Payer: Self-pay | Admitting: Cardiology

## 2020-01-11 ENCOUNTER — Other Ambulatory Visit: Payer: Self-pay

## 2020-01-11 VITALS — Ht 64.0 in | Wt 120.0 lb

## 2020-01-11 DIAGNOSIS — I359 Nonrheumatic aortic valve disorder, unspecified: Secondary | ICD-10-CM | POA: Diagnosis not present

## 2020-01-11 DIAGNOSIS — Z7951 Long term (current) use of inhaled steroids: Secondary | ICD-10-CM

## 2020-01-11 DIAGNOSIS — I429 Cardiomyopathy, unspecified: Secondary | ICD-10-CM

## 2020-01-11 DIAGNOSIS — Z952 Presence of prosthetic heart valve: Secondary | ICD-10-CM

## 2020-01-11 DIAGNOSIS — I2510 Atherosclerotic heart disease of native coronary artery without angina pectoris: Secondary | ICD-10-CM

## 2020-01-11 NOTE — Patient Instructions (Signed)
Medication Instructions:  °Your physician recommends that you continue on your current medications as directed. Please refer to the Current Medication list given to you today. ° °*If you need a refill on your cardiac medications before your next appointment, please call your pharmacy* ° ° °Lab Work: °None today °If you have labs (blood work) drawn today and your tests are completely normal, you will receive your results only by: °• MyChart Message (if you have MyChart) OR °• A paper copy in the mail °If you have any lab test that is abnormal or we need to change your treatment, we will call you to review the results. ° ° °Testing/Procedures: °None today ° ° °Follow-Up: °At [facility], you and your health needs are our priority.  As part of our continuing mission to provide you with exceptional heart care, we have created designated Provider Care Teams.  These Care Teams include your primary Cardiologist (physician) and Advanced Practice Providers (APPs -  Physician Assistants and Nurse Practitioners) who all work together to provide you with the care you need, when you need it. ° °We recommend signing up for the patient portal called "MyChart".  Sign up information is provided on this After Visit Summary.  MyChart is used to connect with patients for Virtual Visits (Telemedicine).  Patients are able to view lab/test results, encounter notes, upcoming appointments, etc.  Non-urgent messages can be sent to your provider as well.   °To learn more about what you can do with MyChart, go to [email or web address].   ° °Your next appointment:   °6 month(s) ° °The format for your next appointment:   °In Person ° °Provider:   °Samuel McDowell, MD ° ° °Other Instructions °None ° ° ° ° °Thank you for choosing [facility] ! ° ° ° ° ° ° ° ° °

## 2020-01-17 ENCOUNTER — Telehealth: Payer: Self-pay

## 2020-01-17 NOTE — Telephone Encounter (Signed)
Pt advised his plavix is blood thinner with verbal understanding

## 2020-01-17 NOTE — Telephone Encounter (Signed)
Pt is calling wanting to know what medication he takes is a blood thinner.  We can leave a msg if he doesn't answer

## 2020-01-31 ENCOUNTER — Inpatient Hospital Stay (HOSPITAL_COMMUNITY)
Admission: EM | Admit: 2020-01-31 | Discharge: 2020-02-03 | DRG: 377 | Disposition: A | Discharge status: Home / Self Care | Payer: Medicare Other | Attending: Internal Medicine | Admitting: Internal Medicine

## 2020-01-31 ENCOUNTER — Encounter: Payer: Self-pay | Admitting: Family Medicine

## 2020-01-31 ENCOUNTER — Other Ambulatory Visit: Payer: Self-pay

## 2020-01-31 ENCOUNTER — Encounter (HOSPITAL_COMMUNITY): Payer: Self-pay | Admitting: Emergency Medicine

## 2020-01-31 ENCOUNTER — Emergency Department (HOSPITAL_COMMUNITY): Payer: Medicare Other

## 2020-01-31 ENCOUNTER — Ambulatory Visit (INDEPENDENT_AMBULATORY_CARE_PROVIDER_SITE_OTHER): Payer: Medicare Other | Admitting: Family Medicine

## 2020-01-31 VITALS — BP 132/60 | HR 75 | Temp 98.0°F | Resp 22 | Ht 64.0 in | Wt 120.0 lb

## 2020-01-31 DIAGNOSIS — K2101 Gastro-esophageal reflux disease with esophagitis, with bleeding: Secondary | ICD-10-CM | POA: Diagnosis present

## 2020-01-31 DIAGNOSIS — Z20822 Contact with and (suspected) exposure to covid-19: Secondary | ICD-10-CM | POA: Diagnosis present

## 2020-01-31 DIAGNOSIS — C7951 Secondary malignant neoplasm of bone: Secondary | ICD-10-CM | POA: Diagnosis not present

## 2020-01-31 DIAGNOSIS — Z86718 Personal history of other venous thrombosis and embolism: Secondary | ICD-10-CM

## 2020-01-31 DIAGNOSIS — K644 Residual hemorrhoidal skin tags: Secondary | ICD-10-CM | POA: Diagnosis not present

## 2020-01-31 DIAGNOSIS — E785 Hyperlipidemia, unspecified: Secondary | ICD-10-CM | POA: Diagnosis present

## 2020-01-31 DIAGNOSIS — R7981 Abnormal blood-gas level: Secondary | ICD-10-CM

## 2020-01-31 DIAGNOSIS — K922 Gastrointestinal hemorrhage, unspecified: Secondary | ICD-10-CM | POA: Diagnosis present

## 2020-01-31 DIAGNOSIS — K635 Polyp of colon: Secondary | ICD-10-CM | POA: Diagnosis not present

## 2020-01-31 DIAGNOSIS — Z79899 Other long term (current) drug therapy: Secondary | ICD-10-CM

## 2020-01-31 DIAGNOSIS — R0602 Shortness of breath: Secondary | ICD-10-CM

## 2020-01-31 DIAGNOSIS — Z7902 Long term (current) use of antithrombotics/antiplatelets: Secondary | ICD-10-CM

## 2020-01-31 DIAGNOSIS — Z955 Presence of coronary angioplasty implant and graft: Secondary | ICD-10-CM | POA: Diagnosis not present

## 2020-01-31 DIAGNOSIS — D509 Iron deficiency anemia, unspecified: Secondary | ICD-10-CM | POA: Diagnosis not present

## 2020-01-31 DIAGNOSIS — I10 Essential (primary) hypertension: Secondary | ICD-10-CM | POA: Diagnosis present

## 2020-01-31 DIAGNOSIS — I5023 Acute on chronic systolic (congestive) heart failure: Secondary | ICD-10-CM | POA: Diagnosis present

## 2020-01-31 DIAGNOSIS — E871 Hypo-osmolality and hyponatremia: Secondary | ICD-10-CM | POA: Diagnosis not present

## 2020-01-31 DIAGNOSIS — I739 Peripheral vascular disease, unspecified: Secondary | ICD-10-CM | POA: Diagnosis not present

## 2020-01-31 DIAGNOSIS — I5043 Acute on chronic combined systolic (congestive) and diastolic (congestive) heart failure: Secondary | ICD-10-CM | POA: Diagnosis present

## 2020-01-31 DIAGNOSIS — I2510 Atherosclerotic heart disease of native coronary artery without angina pectoris: Secondary | ICD-10-CM | POA: Diagnosis not present

## 2020-01-31 DIAGNOSIS — Z953 Presence of xenogenic heart valve: Secondary | ICD-10-CM | POA: Diagnosis not present

## 2020-01-31 DIAGNOSIS — M1990 Unspecified osteoarthritis, unspecified site: Secondary | ICD-10-CM | POA: Diagnosis not present

## 2020-01-31 DIAGNOSIS — C801 Malignant (primary) neoplasm, unspecified: Secondary | ICD-10-CM | POA: Diagnosis not present

## 2020-01-31 DIAGNOSIS — D500 Iron deficiency anemia secondary to blood loss (chronic): Secondary | ICD-10-CM | POA: Diagnosis not present

## 2020-01-31 DIAGNOSIS — J9601 Acute respiratory failure with hypoxia: Secondary | ICD-10-CM | POA: Diagnosis present

## 2020-01-31 DIAGNOSIS — I110 Hypertensive heart disease with heart failure: Secondary | ICD-10-CM | POA: Diagnosis present

## 2020-01-31 DIAGNOSIS — K2991 Gastroduodenitis, unspecified, with bleeding: Secondary | ICD-10-CM

## 2020-01-31 DIAGNOSIS — K5731 Diverticulosis of large intestine without perforation or abscess with bleeding: Secondary | ICD-10-CM | POA: Diagnosis not present

## 2020-01-31 DIAGNOSIS — Z7982 Long term (current) use of aspirin: Secondary | ICD-10-CM

## 2020-01-31 DIAGNOSIS — Z952 Presence of prosthetic heart valve: Secondary | ICD-10-CM

## 2020-01-31 DIAGNOSIS — H547 Unspecified visual loss: Secondary | ICD-10-CM | POA: Diagnosis not present

## 2020-01-31 DIAGNOSIS — R0600 Dyspnea, unspecified: Secondary | ICD-10-CM

## 2020-01-31 DIAGNOSIS — D62 Acute posthemorrhagic anemia: Secondary | ICD-10-CM | POA: Diagnosis not present

## 2020-01-31 DIAGNOSIS — K2100 Gastro-esophageal reflux disease with esophagitis, without bleeding: Secondary | ICD-10-CM | POA: Diagnosis not present

## 2020-01-31 DIAGNOSIS — K449 Diaphragmatic hernia without obstruction or gangrene: Secondary | ICD-10-CM | POA: Diagnosis not present

## 2020-01-31 DIAGNOSIS — D649 Anemia, unspecified: Secondary | ICD-10-CM | POA: Diagnosis not present

## 2020-01-31 DIAGNOSIS — Z87891 Personal history of nicotine dependence: Secondary | ICD-10-CM

## 2020-01-31 DIAGNOSIS — K5730 Diverticulosis of large intestine without perforation or abscess without bleeding: Secondary | ICD-10-CM | POA: Diagnosis not present

## 2020-01-31 DIAGNOSIS — N429 Disorder of prostate, unspecified: Secondary | ICD-10-CM | POA: Diagnosis not present

## 2020-01-31 DIAGNOSIS — D123 Benign neoplasm of transverse colon: Secondary | ICD-10-CM | POA: Diagnosis not present

## 2020-01-31 HISTORY — DX: Abnormal blood-gas level: R79.81

## 2020-01-31 LAB — CBC WITH DIFFERENTIAL/PLATELET
Abs Immature Granulocytes: 0.03 10*3/uL (ref 0.00–0.07)
Basophils Absolute: 0.1 10*3/uL (ref 0.0–0.1)
Basophils Relative: 1 %
Eosinophils Absolute: 0.2 10*3/uL (ref 0.0–0.5)
Eosinophils Relative: 2 %
HCT: 21.3 % — ABNORMAL LOW (ref 39.0–52.0)
Hemoglobin: 6.2 g/dL — CL (ref 13.0–17.0)
Immature Granulocytes: 0 %
Lymphocytes Relative: 11 %
Lymphs Abs: 1.2 10*3/uL (ref 0.7–4.0)
MCH: 20.4 pg — ABNORMAL LOW (ref 26.0–34.0)
MCHC: 29.1 g/dL — ABNORMAL LOW (ref 30.0–36.0)
MCV: 70.1 fL — ABNORMAL LOW (ref 80.0–100.0)
Monocytes Absolute: 1 10*3/uL (ref 0.1–1.0)
Monocytes Relative: 10 %
Neutro Abs: 7.6 10*3/uL (ref 1.7–7.7)
Neutrophils Relative %: 76 %
Platelets: 334 10*3/uL (ref 150–400)
RBC: 3.04 MIL/uL — ABNORMAL LOW (ref 4.22–5.81)
RDW: 17.4 % — ABNORMAL HIGH (ref 11.5–15.5)
WBC: 10.1 10*3/uL (ref 4.0–10.5)
nRBC: 0 % (ref 0.0–0.2)

## 2020-01-31 LAB — FERRITIN: Ferritin: 5 ng/mL — ABNORMAL LOW (ref 24–336)

## 2020-01-31 LAB — COMPREHENSIVE METABOLIC PANEL
ALT: 16 U/L (ref 0–44)
AST: 18 U/L (ref 15–41)
Albumin: 3.1 g/dL — ABNORMAL LOW (ref 3.5–5.0)
Alkaline Phosphatase: 129 U/L — ABNORMAL HIGH (ref 38–126)
Anion gap: 8 (ref 5–15)
BUN: 14 mg/dL (ref 8–23)
CO2: 23 mmol/L (ref 22–32)
Calcium: 8.1 mg/dL — ABNORMAL LOW (ref 8.9–10.3)
Chloride: 97 mmol/L — ABNORMAL LOW (ref 98–111)
Creatinine, Ser: 0.89 mg/dL (ref 0.61–1.24)
GFR calc Af Amer: >60 mL/min (ref >60)
GFR calc non Af Amer: >60 mL/min (ref >60)
Glucose, Bld: 89 mg/dL (ref 70–99)
Potassium: 3.8 mmol/L (ref 3.5–5.1)
Sodium: 128 mmol/L — ABNORMAL LOW (ref 135–145)
Total Bilirubin: 0.8 mg/dL (ref 0.3–1.2)
Total Protein: 6.6 g/dL (ref 6.5–8.1)

## 2020-01-31 LAB — TROPONIN I (HIGH SENSITIVITY)
Troponin I (High Sensitivity): 60 ng/L — ABNORMAL HIGH (ref <18)
Troponin I (High Sensitivity): 60 ng/L — ABNORMAL HIGH (ref <18)

## 2020-01-31 LAB — PROTIME-INR
INR: 1.2 (ref 0.8–1.2)
Prothrombin Time: 14.5 seconds (ref 11.4–15.2)

## 2020-01-31 LAB — VITAMIN B12: Vitamin B-12: 243 pg/mL (ref 180–914)

## 2020-01-31 LAB — POC OCCULT BLOOD, ED: Fecal Occult Bld: NEGATIVE

## 2020-01-31 LAB — IRON AND TIBC
Iron: 9 ug/dL — ABNORMAL LOW (ref 45–182)
Saturation Ratios: 3 % — ABNORMAL LOW (ref 17.9–39.5)
TIBC: 332 ug/dL (ref 250–450)
UIBC: 323 ug/dL

## 2020-01-31 LAB — RETICULOCYTES
Immature Retic Fract: 27.9 % — ABNORMAL HIGH (ref 2.3–15.9)
RBC.: 3.19 MIL/uL — ABNORMAL LOW (ref 4.22–5.81)
Retic Count, Absolute: 56.5 10*3/uL (ref 19.0–186.0)
Retic Ct Pct: 1.8 % (ref 0.4–3.1)

## 2020-01-31 LAB — BRAIN NATRIURETIC PEPTIDE: B Natriuretic Peptide: 1406 pg/mL — ABNORMAL HIGH (ref 0.0–100.0)

## 2020-01-31 LAB — FOLATE: Folate: 11.8 ng/mL (ref >5.9)

## 2020-01-31 LAB — SARS CORONAVIRUS 2 BY RT PCR (HOSPITAL ORDER, PERFORMED IN ~~LOC~~ HOSPITAL LAB): SARS Coronavirus 2: NEGATIVE

## 2020-01-31 LAB — PREPARE RBC (CROSSMATCH)

## 2020-01-31 MED ORDER — ATORVASTATIN CALCIUM 40 MG PO TABS
40.0000 mg | ORAL_TABLET | Freq: Every day | ORAL | Status: DC
  Administered 2020-02-01 – 2020-02-03 (×2): 40 mg via ORAL
  Filled 2020-01-31 (×3): qty 1

## 2020-01-31 MED ORDER — SODIUM CHLORIDE 0.9% FLUSH: 3.0000 mL | INTRAVENOUS | Status: DC | PRN

## 2020-01-31 MED ORDER — SODIUM CHLORIDE 0.9% FLUSH
3.0000 mL | Freq: Two times a day (BID) | INTRAVENOUS | Status: DC
  Administered 2020-01-31 – 2020-02-03 (×6): 3 mL via INTRAVENOUS

## 2020-01-31 MED ORDER — ONDANSETRON HCL 4 MG/2ML IJ SOLN
4.0000 mg | Freq: Four times a day (QID) | INTRAMUSCULAR | Status: DC | PRN
  Administered 2020-02-01: 4 mg via INTRAVENOUS
  Filled 2020-01-31 (×2): qty 2

## 2020-01-31 MED ORDER — FUROSEMIDE 10 MG/ML IJ SOLN
20.0000 mg | Freq: Once | INTRAMUSCULAR | Status: AC
  Administered 2020-01-31: 20 mg via INTRAVENOUS
  Filled 2020-01-31: qty 2

## 2020-01-31 MED ORDER — SODIUM CHLORIDE 0.9 % IV SOLN
10.0000 mL/h | Freq: Once | INTRAVENOUS | Status: AC
  Administered 2020-01-31: 10 mL/h via INTRAVENOUS

## 2020-01-31 MED ORDER — MONTELUKAST SODIUM 10 MG PO TABS
10.0000 mg | ORAL_TABLET | Freq: Every day | ORAL | Status: DC
  Administered 2020-01-31 – 2020-02-02 (×3): 10 mg via ORAL
  Filled 2020-01-31 (×3): qty 1

## 2020-01-31 MED ORDER — VITAMIN D 25 MCG (1000 UNIT) PO TABS
1000.0000 [IU] | ORAL_TABLET | Freq: Every day | ORAL | Status: DC
  Administered 2020-02-01 – 2020-02-03 (×2): 1000 [IU] via ORAL
  Filled 2020-01-31 (×5): qty 1

## 2020-01-31 MED ORDER — ESCITALOPRAM OXALATE 10 MG PO TABS
10.0000 mg | ORAL_TABLET | Freq: Every day | ORAL | Status: DC
  Administered 2020-02-01 – 2020-02-03 (×2): 10 mg via ORAL
  Filled 2020-01-31 (×3): qty 1

## 2020-01-31 MED ORDER — POLYETHYLENE GLYCOL 3350 17 G PO PACK: 17.0000 g | PACK | Freq: Every day | ORAL | Status: DC | PRN

## 2020-01-31 MED ORDER — PANTOPRAZOLE SODIUM 40 MG IV SOLR
40.0000 mg | Freq: Two times a day (BID) | INTRAVENOUS | Status: DC
  Administered 2020-01-31 – 2020-02-02 (×5): 40 mg via INTRAVENOUS
  Filled 2020-01-31 (×5): qty 40

## 2020-01-31 MED ORDER — FUROSEMIDE 40 MG PO TABS
40.0000 mg | ORAL_TABLET | Freq: Once | ORAL | Status: AC
  Administered 2020-01-31: 40 mg via ORAL
  Filled 2020-01-31: qty 1

## 2020-01-31 MED ORDER — NITROGLYCERIN 0.4 MG SL SUBL: 0.4000 mg | SUBLINGUAL_TABLET | SUBLINGUAL | Status: DC | PRN

## 2020-01-31 MED ORDER — ONDANSETRON HCL 4 MG PO TABS
4.0000 mg | ORAL_TABLET | Freq: Four times a day (QID) | ORAL | Status: DC | PRN
  Administered 2020-02-03: 4 mg via ORAL

## 2020-01-31 MED ORDER — METOPROLOL SUCCINATE ER 50 MG PO TB24
50.0000 mg | ORAL_TABLET | Freq: Every day | ORAL | Status: DC
  Administered 2020-02-01 – 2020-02-03 (×2): 50 mg via ORAL
  Filled 2020-01-31 (×3): qty 1

## 2020-01-31 MED ORDER — TRAZODONE HCL 50 MG PO TABS
50.0000 mg | ORAL_TABLET | Freq: Every evening | ORAL | Status: DC | PRN
  Administered 2020-02-01: 50 mg via ORAL
  Filled 2020-01-31: qty 1

## 2020-01-31 MED ORDER — SODIUM CHLORIDE 0.9 % IV SOLN: 250.0000 mL | INTRAVENOUS | Status: DC | PRN

## 2020-01-31 MED ORDER — LISINOPRIL 10 MG PO TABS
20.0000 mg | ORAL_TABLET | Freq: Every day | ORAL | Status: DC
  Administered 2020-02-01 – 2020-02-03 (×2): 20 mg via ORAL
  Filled 2020-01-31 (×3): qty 2

## 2020-01-31 MED ORDER — FUROSEMIDE 40 MG PO TABS
40.0000 mg | ORAL_TABLET | Freq: Every day | ORAL | Status: DC
  Administered 2020-02-01: 40 mg via ORAL
  Filled 2020-01-31 (×2): qty 1

## 2020-01-31 MED ORDER — BUSPIRONE HCL 5 MG PO TABS
10.0000 mg | ORAL_TABLET | Freq: Two times a day (BID) | ORAL | Status: DC
  Administered 2020-01-31 – 2020-02-03 (×5): 10 mg via ORAL
  Filled 2020-01-31 (×6): qty 2

## 2020-01-31 NOTE — Assessment & Plan Note (Signed)
Low oxygenation between 88 and 94%.  Restored on 2 L nasal cannula.  Given the changes in his PE and presentation he was sent to the emergency room.

## 2020-01-31 NOTE — H&P (Signed)
Patient Demographics:    [name], is a 69 y.o. male  MRN: [id number]   DOB - 04/26/1951  Admit Date - 01/31/2020  Outpatient Primary MD for the patient is [name], MD   Assessment & Plan:    Principal Problem:   Symptomatic anemia--acute on chronic secondary to acute blood loss Active Problems:   Acute GI bleeding   Acute on chronic HFrEF (heart failure with reduced ejection fraction) -combined systolic and diastolic dysfunction CHF   S/P aortic valve replacement   Peripheral arterial disease (HCC)   Essential hypertension, benign   Coronary atherosclerosis of native coronary artery   Claudication in peripheral vascular disease (HCC)    1) acute on chronic iron deficiency anemia-- Hgb down to 6.2 from 8.3 in February 2021, MCV and [facility] are low, RDW is elevated B12 is not low at 243, folate is 11.8, 0.2 -Serum iron is 9, iron saturation is 3, ferritin is 5 2 units of PRBCs ordered  Stool Hemoccult negative, -Need to rule out GI bleed, GI consult requested -Protonix as ordered  2) acute hypoxic respiratory failure--suspect due to worsening anemia and possible high-output cardiac failure with elevated BNP levels > 1,400 -Supplemental oxygen and transfusion and IV Lasix as ordered -Troponins are flat at 60 x 2 times, not consistent with ACS  3)PAD/ H /o CAD--prior history of bare-metal stent to mid RCA aortic valve repair/TAVR as well as stent graft in ascending thoracic aorta--- PTA patient was on aspirin and Plavix this is currently on hold due to symptomatic anemia concerns about GI bleed -Chest pain-free, no intermittent claudication, --Troponins are flat at 60 x 2 times, not consistent with ACS  4)HFrEF--patient presenting with acute on chronic combined systolic and diastolic  dysfunction CHF in the setting of symptomatic anemia with elevated BNP--- suspect high output heart failure due to severe anemia with hemoglobin of 6.2 -Patient is very symptomatic, BNP over 1400 -Last known EF 45 to 50% based on echo from April 2021 ---Troponins are flat at 60 x 2 times, not consistent with ACS -Anticipate will maintain cardiovascular function posttransfusion on diuresis  With History of - Reviewed by me  Past Medical History:  Diagnosis Date  . Arterial embolus of lower extremity ([place]) 11/23/2013  . Arthritis   . CAD (coronary artery disease)    a. s/p PCI with 3.5 x 18 BMS to mid RCA, non obst LAD, Lcx patent 08/10/13  . Depression, major, single episode, mild ([place]) 12/20/2019  . Embolus of femoral artery [facility]) 2012   Surgical repair, previously on Coumadin  . Essential hypertension   . ETOH abuse   . History of DVT (deep vein thrombosis) 2011  . History of GI bleed    February 2012   . Ingrown left greater toenail 09/04/2018  . 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 Prosthetic valve endocarditis ([place])   . PVD (peripheral vascular disease) ([place])   . S/P aortic valve replacement    Details not clear -  reportedly 1971 per patient      Past Surgical History:  Procedure Laterality Date  . ABDOMINAL AORTOGRAM W/LOWER EXTREMITY N/A 10/31/2018   Procedure: ABDOMINAL AORTOGRAM W/LOWER EXTREMITY;  Surgeon: Lorretta Harp, MD;  Location: [place] [facility];  Service: Cardiovascular;  Laterality: N/A;  . Aoric valve replacement  January 2005   38mm Medtronic Freestyle stentless porcine bioprosthesis [facility])  . CARDIAC CATHETERIZATION     30-40% mid LAD, 20-30% ostial D1, proximal 30-40% RCA, 95% mid RCA thrombus, 40% distal stenosis. EF 45%, inferior HK.  [name] COLONOSCOPY N/A 04/20/2014   Procedure: COLONOSCOPY;  Surgeon: Rogene Houston, MD;  Location: [facility]  ENDO SUITE;  Service: Endoscopy;  Laterality: N/A;  930  . CORONARY ANGIOPLASTY WITH STENT PLACEMENT  07/2013  . INGUINAL HERNIA REPAIR Bilateral 04/17/2019   Procedure: ATTEMPTED LAPAROSCOPIC BILATERAL INGUINAL HERNIA REPAIR;  Surgeon: Coralie Keens, MD;  Location: [place];  Service: General;  Laterality: Bilateral;  . INGUINAL HERNIA REPAIR Bilateral 04/17/2019   Procedure: Hernia Repair Inguinal Adult;  Surgeon: Coralie Keens, MD;  Location: [place];  Service: General;  Laterality: Bilateral;  . LEFT HEART CATHETERIZATION WITH CORONARY ANGIOGRAM N/A 08/10/2013   Procedure: LEFT HEART CATHETERIZATION WITH CORONARY ANGIOGRAM;  Surgeon: Blane Ohara, MD;  Location: [facility] CATH LAB;  Service: Cardiovascular;  Laterality: N/A;  . Left leg embolectomy  May 2012   Dr. Trula Slade - reason for lifelong coumadin  . LYMPH NODE BIOPSY Left 11/21/2019   Procedure: Left Neck LYMPH NODE BIOPSY;  Surgeon: Rozetta Nunnery, MD;  Location: [place];  Service: ENT;  Laterality: Left;  . PERIPHERAL VASCULAR ATHERECTOMY  10/31/2018   Procedure: PERIPHERAL VASCULAR ATHERECTOMY;  Surgeon: Lorretta Harp, MD;  Location: [place] CV LAB;  Service: Cardiovascular;;  left SFA      Chief Complaint  Patient presents with  . Shortness of Breath      HPI:    [name]  is a 69 y.o. male reformed smoker with PMHx  of peripheral artery disease, arterial embolus of the lower extremity, CAD, hypertension, chronic anemia, and past surgical history of TAVR on ASA and Plavix  presents from PCPs office to the ED with complaints of progressive shortness of breath since around 01/24/2020 now with dyspnea on exertion and hypoxia -Patient had hypoxia with minimal ambulation --PCP sent to the ED for further evaluation -- In ED--chest x-ray without acute findings evidence of prior  aortic valve repair as well as stent graft in ascending thoracic aorta -Stool occult blood is negative -Troponin is 60 -Na is 128 with  creatinine of 0.89 and a BUN of 14 -BNP is elevated at 1406--no prior baseline available -Hgb down to 6.2 from 8.3 in February 2021, MCV and [facility] are low, RDW is elevated -EDP ordered blood transfusion and requested hospitalization  -Patient denies current alcohol or tobacco use   Review of systems:    In addition to the HPI above,   A full Review of  Systems was done, all other systems reviewed are negative except as noted above in HPI , .    Social History:  Reviewed by me    Social History   Tobacco Use  . Smoking status: Former Smoker    Packs/day: 0.50    Years: 3.00  Pack years: 1.50    Types: Cigarettes    Start date: 10/12/1965    Quit date: 02/19/1970    Years since quitting: 49.9  . Smokeless tobacco: Never Used  Substance Use Topics  . Alcohol use: Not Currently     Family History :  Reviewed by me    Family History  Problem Relation Age of Onset  . Diabetes Mother   . Cirrhosis Father   . COPD Sister      Home Medications:   Prior to Admission medications   Medication Sig Start Date End Date Taking? Authorizing Provider  aspirin EC 81 MG tablet Take 1 tablet (81 mg total) by mouth daily. 11/01/17  Yes [name], MD  atorvastatin (LIPITOR) 40 MG tablet Take 1 tablet by mouth once daily 12/21/19  Yes Perlie Mayo, NP  cholecalciferol (VITAMIN D) 1000 units tablet Take 1 tablet (1,000 Units total) by mouth daily. 11/01/17  Yes [name], MD  clopidogrel (PLAVIX) 75 MG tablet Take 1 tablet by mouth once daily Patient taking differently: Take 75 mg by mouth daily.  08/29/19  Yes [name], MD  fluticasone (FLONASE) 50 MCG/ACT nasal spray Place 2 sprays into both nostrils daily. Patient taking differently: Place 2 sprays into both nostrils daily as needed for allergies.  03/13/19  Yes [name], MD  furosemide (LASIX) 40 MG tablet Take 1 tablet by mouth once daily 12/21/19  Yes Perlie Mayo, NP  lisinopril (ZESTRIL) 20  MG tablet Take 1 tablet by mouth once daily 12/21/19  Yes Perlie Mayo, NP  metoprolol succinate (TOPROL-XL) 50 MG 24 hr tablet TAKE 1 TABLET BY MOUTH ONCE DAILY WITH  OR  IMMEDIATELY  FOLLOWING  A  MEAL 12/21/19  Yes Perlie Mayo, NP  montelukast (SINGULAIR) 10 MG tablet TAKE 1 TABLET BY MOUTH AT BEDTIME 12/21/19  Yes Perlie Mayo, NP  nitroGLYCERIN (NITROSTAT) 0.4 MG SL tablet Place 1 tablet (0.4 mg total) under the tongue every 5 (five) minutes as needed for chest pain. 12/20/19  Yes Lorretta Harp, MD  trolamine salicylate (ASPERCREME) 10 % cream Apply 1 application topically as needed for muscle pain.   Yes [provider]  busPIRone (BUSPAR) 10 MG tablet Take 1 tablet (10 mg total) by mouth 2 (two) times daily. Patient not taking: Reported on 01/31/2020 12/20/19   Perlie Mayo, NP  escitalopram (LEXAPRO) 10 MG tablet Take 1 tablet (10 mg total) by mouth daily. Patient not taking: Reported on 01/31/2020 12/20/19   Perlie Mayo, NP     Allergies:    No Known Allergies   Physical Exam:   Vitals  Blood pressure (!) 131/59, pulse 82, temperature 98.2 F (36.8 C), temperature source Oral, resp. rate (!) 30, height 5\' 4"  (1.626 m), weight 53.5 kg, SpO2 99 %.  Physical Examination: General appearance - alert, conversational dyspnea noted  Mental status - alert, oriented to person, place, and time,  Eyes - sclera anicteric Neck - supple, No JVD elevation , Chest -diminished in bases, scattered wheezes bilaterally Heart - S1 and S2 normal, regular  Abdomen - soft, nontender, nondistended, no masses or organomegaly Neurological - screening mental status exam normal, neck supple without rigidity, cranial nerves II through XII intact, DTR's normal and symmetric Extremities - no pedal edema noted, intact peripheral pulses  Skin - warm, dry     Data Review:    CBC Recent Labs  Lab 01/31/20 1149  WBC  10.1  HGB 6.2*  HCT 21.3*  PLT 334  MCV 70.1*  MCH 20.4*  MCHC  29.1*  RDW 17.4*  LYMPHSABS 1.2  MONOABS 1.0  EOSABS 0.2  BASOSABS 0.1   ------------------------------------------------------------------------------------------------------------------  Chemistries  Recent Labs  Lab 01/31/20 1149  NA 128*  K 3.8  CL 97*  CO2 23  GLUCOSE 89  BUN 14  CREATININE 0.89  CALCIUM 8.1*  AST 18  ALT 16  ALKPHOS 129*  BILITOT 0.8   ------------------------------------------------------------------------------------------------------------------ estimated creatinine clearance is 60.1 mL/min (by C-G formula based on SCr of 0.89 mg/dL). ------------------------------------------------------------------------------------------------------------------ No results for input(s): TSH, T4TOTAL, T3FREE, THYROIDAB in the last 72 hours.  Invalid input(s): FREET3   Coagulation profile Recent Labs  Lab 01/31/20 1254  INR 1.2   ------------------------------------------------------------------------------------------------------------------- No results for input(s): DDIMER in the last 72 hours. -------------------------------------------------------------------------------------------------------------------  Cardiac Enzymes No results for input(s): CKMB, TROPONINI, MYOGLOBIN in the last 168 hours.  Invalid input(s): CK ------------------------------------------------------------------------------------------------------------------    Component Value Date/Time   BNP 1,406.0 (H) 01/31/2020 1149     ---------------------------------------------------------------------------------------------------------------  Urinalysis    Component Value Date/Time   COLORURINE YELLOW 02/05/2011 1016   APPEARANCEUR CLEAR 02/05/2011 1016   LABSPEC 1.020 02/05/2011 1016   PHURINE 5.5 02/05/2011 1016   GLUCOSEU NEGATIVE 02/05/2011 1016   GLUCOSEU 100 (A) 07/08/2010 1902   HGBUR TRACE (A) 02/05/2011 1016   HGBUR small 07/10/2010 1004   BILIRUBINUR negative  10/17/2019 0921   KETONESUR negative 10/17/2019 0921   KETONESUR TRACE (A) 02/05/2011 1016   PROTEINUR NEGATIVE 02/05/2011 1016   UROBILINOGEN 0.2 10/17/2019 0921   UROBILINOGEN 0.2 02/05/2011 1016   NITRITE Negative 10/17/2019 0921   NITRITE NEGATIVE 02/05/2011 1016   LEUKOCYTESUR Trace (A) 10/17/2019 0921    ----------------------------------------------------------------------------------------------------------------   Imaging Results:    DG Chest Portable 1 View  Result Date: 01/31/2020 CLINICAL DATA:  Shortness of breath. EXAM: PORTABLE CHEST 1 VIEW COMPARISON:  August 09, 2013. FINDINGS: Stable cardiomegaly. Status post aortic valve repair as well as probable stent graft in ascending thoracic aorta. No pneumothorax or pleural effusion is noted. Lungs are clear. Bony thorax is unremarkable. IMPRESSION: No acute cardiopulmonary abnormality seen. Electronically Signed   By: Marijo Conception M.D.   On: 01/31/2020 11:45    Radiological Exams on Admission: DG Chest Portable 1 View  Result Date: 01/31/2020 CLINICAL DATA:  Shortness of breath. EXAM: PORTABLE CHEST 1 VIEW COMPARISON:  August 09, 2013. FINDINGS: Stable cardiomegaly. Status post aortic valve repair as well as probable stent graft in ascending thoracic aorta. No pneumothorax or pleural effusion is noted. Lungs are clear. Bony thorax is unremarkable. IMPRESSION: No acute cardiopulmonary abnormality seen. Electronically Signed   By: Marijo Conception M.D.   On: 01/31/2020 11:45    DVT Prophylaxis -SCD  (possible GI bleed) AM Labs Ordered, also please review Full Orders  Family Communication: Admission, patients condition and plan of care including tests being ordered have been discussed with the patient  who indicate understanding and agree with the plan   Code Status - Full Code  Likely DC to  home  Condition   stable  Roxan Hockey M.D on 01/31/2020 at 7:34 PM Go to [email or web address] -  for contact info  [facility] - Office  [phone]

## 2020-01-31 NOTE — ED Provider Notes (Signed)
Emergency Department Provider Note   I have reviewed the triage vital signs and the nursing notes.   HISTORY  Chief Complaint Shortness of Breath   HPI [name] is a 69 y.o. male with past history of peripheral artery disease, arterial embolus of the lower extremity, CAD, hypertension, and past surgical history of TAVR on ASA and Plavix presents to the emergency department with increased shortness of breath over the past several days.  Symptoms are worse with lying flat.  He does not have associated chest pain.  He has mild cough with his shortness of breath but no fevers or shaking chills.  No hemoptysis.  He notes similar symptoms in the past but is unsure of the exact diagnosis. No vomiting or diarrhea. No UTI symptoms.   Past Medical History:  Diagnosis Date  . Arterial embolus of lower extremity ([place]) 11/23/2013  . Arthritis   . CAD (coronary artery disease)    a. s/p PCI with 3.5 x 18 BMS to mid RCA, non obst LAD, Lcx patent 08/10/13  . Depression, major, single episode, mild ([place]) 12/20/2019  . Embolus of femoral artery [facility]) 2012   Surgical repair, previously on Coumadin  . Essential hypertension   . ETOH abuse   . History of DVT (deep vein thrombosis) 2011  . History of GI bleed    February 2012   . Ingrown left greater toenail 09/04/2018  . 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 Mycotic aneurysm ([place])    Associated with GI bleed and hepatic artery aneurysm status post embolization February 2012  . Prosthetic valve endocarditis ([place])   . PVD (peripheral vascular disease) ([place])   . S/P aortic valve replacement    Details not clear -  reportedly 1971 per patient    Patient Active Problem List   Diagnosis Date Noted  . Low oxygen saturation 01/31/2020  . Acute GI bleeding 01/31/2020  . Symptomatic anemia--acute on chronic secondary to acute blood loss 01/31/2020  . Acute on chronic HFrEF (heart failure with reduced ejection fraction) -combined systolic and  diastolic dysfunction CHF [id number]  . Nervously anxious 12/20/2019  . Depression, major, single episode, mild ([place]) 12/20/2019  . Hematuria 10/17/2019  . Pain with urination 10/17/2019  . Weight loss, non-intentional 10/17/2019  . Annual visit for general adult medical examination with abnormal findings 09/12/2019  . Supraclavicular lymphadenopathy 09/12/2019  . Grade 3 out of 6 intensity murmur 09/12/2019  . Bilateral recurrent inguinal hernias 03/08/2019  . Hernia of abdominal cavity 02/27/2019  . Claudication in peripheral vascular disease ([place]) 10/31/2018  . Peripheral arterial disease ([place]) 10/26/2018  . Shortness of breath on exertion 10/16/2017  . Severe aortic insufficiency 10/02/2017  . Allergic rhinitis 12/30/2015  . ED (erectile dysfunction) 03/27/2015  . Hyperlipidemia LDL goal <70 08/25/2013  . Aortic atherosclerosis ([place]) 08/12/2013  . Coronary atherosclerosis of native coronary artery 08/10/2013  . Hepatic artery aneurysm ([place]) 12/10/2010  . S/P aortic valve replacement   . Essential hypertension, benign 05/13/2010    Past Surgical History:  Procedure Laterality Date  . ABDOMINAL AORTOGRAM W/LOWER EXTREMITY N/A 10/31/2018   Procedure: ABDOMINAL AORTOGRAM W/LOWER EXTREMITY;  Surgeon: Lorretta Harp, MD;  Location: [place] CV LAB;  Service: Cardiovascular;  Laterality: N/A;  . Aoric valve replacement  January 2005   65mm Medtronic Freestyle stentless porcine bioprosthesis [facility])  . CARDIAC CATHETERIZATION     30-40% mid LAD, 20-30% ostial D1, proximal 30-40% RCA, 95% mid RCA thrombus, 40%  distal stenosis. EF 45%, inferior HK.  [name] COLONOSCOPY N/A 04/20/2014   Procedure: COLONOSCOPY;  Surgeon: Rogene Houston, MD;  [facility];  Service: Endoscopy;  Laterality: N/A;  930  . CORONARY ANGIOPLASTY WITH STENT PLACEMENT  07/2013  . INGUINAL HERNIA REPAIR Bilateral 04/17/2019   Procedure: ATTEMPTED LAPAROSCOPIC BILATERAL INGUINAL HERNIA REPAIR;  Surgeon:  Coralie Keens, MD;  Location: [place];  Service: General;  Laterality: Bilateral;  . INGUINAL HERNIA REPAIR Bilateral 04/17/2019   Procedure: Hernia Repair Inguinal Adult;  Surgeon: Coralie Keens, MD;  Location: [place];  Service: General;  Laterality: Bilateral;  . LEFT HEART CATHETERIZATION WITH CORONARY ANGIOGRAM N/A 08/10/2013   Procedure: LEFT HEART CATHETERIZATION WITH CORONARY ANGIOGRAM;  Surgeon: Blane Ohara, MD;  Location: [facility] C[facility] LAB;  Service: Cardiovascular;  Laterality: N/A;  . Left leg embolectomy  May 2012   Dr. Trula Slade - reason for lifelong coumadin  . LYMPH NODE BIOPSY Left 11/21/2019   Procedure: Left Neck LYMPH NODE BIOPSY;  Surgeon: Rozetta Nunnery, MD;  Location: [place];  Service: ENT;  Laterality: Left;  . PERIPHERAL VASCULAR ATHERECTOMY  10/31/2018   Procedure: PERIPHERAL VASCULAR ATHERECTOMY;  Surgeon: Lorretta Harp, MD;  Location: [place] CV LAB;  Service: Cardiovascular;;  left SFA    Allergies Patient has no known allergies.  Family History  Problem Relation Age of Onset  . Diabetes Mother   . Cirrhosis Father   . COPD Sister     Social History Social History   Tobacco Use  . Smoking status: Former Smoker    Packs/day: 0.50    Years: 3.00    Pack years: 1.50    Types: Cigarettes    Start date: 10/12/1965    Quit date: 02/19/1970    Years since quitting: 49.9  . Smokeless tobacco: Never Used  Substance Use Topics  . Alcohol use: Not Currently  . Drug use: Yes    Types: Marijuana    Comment: pt smokes weed once in the morning and once in the evening    Review of Systems  Constitutional: No fever/chills Eyes: No visual changes. ENT: No sore throat. Cardiovascular: Denies chest pain. Respiratory: Positive shortness of breath. Gastrointestinal: No abdominal pain.  No nausea, no vomiting.  No diarrhea.  No constipation. Genitourinary: Negative for dysuria. Musculoskeletal: Negative for back pain. Skin: Negative for  rash. Neurological: Negative for headaches, focal weakness or numbness.  10-point ROS otherwise negative.  ____________________________________________   PHYSICAL EXAM:  VITAL SIGNS: ED Triage Vitals  Enc Vitals Group     BP 01/31/20 1106 (!) 145/67     Pulse Rate 01/31/20 1106 76     Resp 01/31/20 1106 (!) 24     Temp 01/31/20 1106 98 F (36.7 C)     Temp Source 01/31/20 1106 Oral     SpO2 01/31/20 1106 100 %     Weight 01/31/20 1107 118 lb (53.5 kg)     Height 01/31/20 1107 5\' 4"  (1.626 m)   Constitutional: Alert and oriented. Well appearing and in no acute distress. Eyes: Conjunctivae are normal.  Head: Atraumatic. Nose: No congestion/rhinnorhea. Mouth/Throat: Mucous membranes are moist.  Neck: No stridor.  Cardiovascular: Normal rate, regular rhythm. Good peripheral circulation. Grossly normal heart sounds.   Respiratory: Normal respiratory effort.  No retractions. Lungs slightly diminished at the bases. No wheezing.  Gastrointestinal: Soft and nontender. No distention. Rectal exam performed with nurse chaperone and patient's verbal consent. No gross blood or melena.  Musculoskeletal:  No lower extremity tenderness nor edema. No gross deformities of extremities. Neurologic:  Normal speech and language.  Skin:  Skin is warm, dry and intact. No rash noted.   ____________________________________________   LABS (all labs ordered are listed, but only abnormal results are displayed)  Labs Reviewed  CBC WITH DIFFERENTIAL/PLATELET - Abnormal; Notable for the following components:      Result Value   RBC 3.04 (*)    Hemoglobin 6.2 (*)    HCT 21.3 (*)    MCV 70.1 (*)    MCH 20.4 (*)    MCHC 29.1 (*)    RDW 17.4 (*)    All other components within normal limits  COMPREHENSIVE METABOLIC PANEL - Abnormal; Notable for the following components:   Sodium 128 (*)    Chloride 97 (*)    Calcium 8.1 (*)    Albumin 3.1 (*)    Alkaline Phosphatase 129 (*)    All other  components within normal limits  BRAIN NATRIURETIC PEPTIDE - Abnormal; Notable for the following components:   B Natriuretic Peptide 1,406.0 (*)    All other components within normal limits  IRON AND TIBC - Abnormal; Notable for the following components:   Iron 9 (*)    Saturation Ratios 3 (*)    All other components within normal limits  FERRITIN - Abnormal; Notable for the following components:   Ferritin 5 (*)    All other components within normal limits  RETICULOCYTES - Abnormal; Notable for the following components:   RBC. 3.19 (*)    Immature Retic Fract 27.9 (*)    All other components within normal limits  TROPONIN I (HIGH SENSITIVITY) - Abnormal; Notable for the following components:   Troponin I (High Sensitivity) 60 (*)    All other components within normal limits  TROPONIN I (HIGH SENSITIVITY) - Abnormal; Notable for the following components:   Troponin I (High Sensitivity) 60 (*)    All other components within normal limits  SARS CORONAVIRUS 2 BY RT PCR (HOSPITAL ORDER, [place] [facility])  VITAMIN B12  FOLATE  PROTIME-INR  BASIC METABOLIC PANEL  CBC  POC OCCULT BLOOD, ED  TYPE AND SCREEN  PREPARE RBC (CROSSMATCH)   ____________________________________________  EKG   EKG Interpretation  Date/Time:  Wednesday Jan 31 2020 11:13:50 EDT Ventricular Rate:  59 PR Interval:    QRS Duration: 108 QT Interval:  449 QTC Calculation: 445 R Axis:   -32 Text Interpretation: Sinus rhythm Prolonged PR interval Probable left atrial enlargement LVH w/ repol abnormalities, possible ischemia Similar to prior. No STEMI Confirmed by Nanda Quinton [phone]) on 01/31/2020 11:49:25 AM       ____________________________________________  RADIOLOGY  DG Chest Portable 1 View  Result Date: 01/31/2020 CLINICAL DATA:  Shortness of breath. EXAM: PORTABLE CHEST 1 VIEW COMPARISON:  August 09, 2013. FINDINGS: Stable cardiomegaly. Status post aortic valve repair as  well as probable stent graft in ascending thoracic aorta. No pneumothorax or pleural effusion is noted. Lungs are clear. Bony thorax is unremarkable. IMPRESSION: No acute cardiopulmonary abnormality seen. Electronically Signed   By: Marijo Conception M.D.   On: 01/31/2020 11:45    ____________________________________________   PROCEDURES  Procedure(s) performed:   Procedures  CRITICAL CARE Performed by: Margette Fast Total critical care time: 35 minutes Critical care time was exclusive of separately billable procedures and treating other patients. Critical care was necessary to treat or prevent imminent or life-threatening deterioration. Critical care was time spent personally  by me on the following activities: development of treatment plan with patient and/or surrogate as well as nursing, discussions with consultants, evaluation of patient's response to treatment, examination of patient, obtaining history from patient or surrogate, ordering and performing treatments and interventions, ordering and review of laboratory studies, ordering and review of radiographic studies, pulse oximetry and re-evaluation of patient's condition.  Nanda Quinton, MD Emergency Medicine  ____________________________________________   INITIAL IMPRESSION / ASSESSMENT AND PLAN / ED COURSE  Pertinent labs & imaging results that were available during my care of the patient were reviewed by me and considered in my medical decision making (see chart for details).   Patient with past medical history reviewed presents to the emergency department with increased shortness of breath worse with lying flat.  He does not appear clinically volume overloaded.  Vital signs are largely unremarkable but he is requiring 2 L nasal cannula oxygen here which is new.  No wheezing on exam.  Plan for lab work, chest x-ray, Covid testing.   Patient with anemia here likely contributing to symptoms. Plan for PRBC transfusion here.    Discussed patient's case with [facility] to request admission. Patient and family (if present) updated with plan. Care transferred to [facility] service.  I reviewed all nursing notes, vitals, pertinent old records, EKGs, labs, imaging (as available).  ____________________________________________  FINAL CLINICAL IMPRESSION(S) / ED DIAGNOSES  Final diagnoses:  Symptomatic anemia  SOB (shortness of breath)     MEDICATIONS GIVEN DURING THIS VISIT:  Medications  pantoprazole (PROTONIX) injection 40 mg (40 mg Intravenous Given 01/31/20 1435)  cholecalciferol (VITAMIN D) tablet 1,000 Units (1,000 Units Oral Not Given 01/31/20 1931)  escitalopram (LEXAPRO) tablet 10 mg (10 mg Oral Not Given 01/31/20 1931)  busPIRone (BUSPAR) tablet 10 mg (has no administration in time range)  nitroGLYCERIN (NITROSTAT) SL tablet 0.4 mg (has no administration in time range)  atorvastatin (LIPITOR) tablet 40 mg (40 mg Oral Not Given 01/31/20 1931)  lisinopril (ZESTRIL) tablet 20 mg (20 mg Oral Not Given 01/31/20 1932)  furosemide (LASIX) tablet 40 mg (40 mg Oral Not Given 01/31/20 1933)  montelukast (SINGULAIR) tablet 10 mg (has no administration in time range)  metoprolol succinate (TOPROL-XL) 24 hr tablet 50 mg (50 mg Oral Not Given 01/31/20 1933)  sodium chloride flush (NS) 0.9 % injection 3 mL (has no administration in time range)  sodium chloride flush (NS) 0.9 % injection 3 mL (has no administration in time range)  0.9 %  sodium chloride infusion (has no administration in time range)  traZODone (DESYREL) tablet 50 mg (has no administration in time range)  polyethylene glycol (MIRALAX / GLYCOLAX) packet 17 g (has no administration in time range)  ondansetron (ZOFRAN) tablet 4 mg (has no administration in time range)    Or  ondansetron (ZOFRAN) injection 4 mg (has no administration in time range)  0.9 %  sodium chloride infusion (0 mL/hr Intravenous Stopped 01/31/20 1442)  furosemide (LASIX) tablet 40 mg (40 mg Oral  Given 01/31/20 1918)  furosemide (LASIX) injection 20 mg (20 mg Intravenous Given 01/31/20 2022)     Note:  This document was prepared using Dragon voice recognition software and may include unintentional dictation errors.  Nanda Quinton, MD, [facility] Emergency Medicine    Leva Baine, [facility], MD 01/31/20 2059

## 2020-01-31 NOTE — ED Triage Notes (Signed)
Shortness of breath started last Wednesday

## 2020-01-31 NOTE — Care Management Obs Status (Signed)
[facility] NOTIFICATION   Patient Details  Name: LAWRNCE HEYES MRN: [id number] Date of Birth: 05/28/1951   Medicare Observation Status Notification Given:  Yes    Sherie Don, LCSW 01/31/2020, 4:44 PM

## 2020-01-31 NOTE — Assessment & Plan Note (Signed)
Sent to the emergency room at [facility].

## 2020-01-31 NOTE — Progress Notes (Signed)
Subjective:  Patient ID: [name], male    DOB: Mar 13, 1951  Age: 69 y.o. MRN: [id number]  CC:  Chief Complaint  Patient presents with  . Shortness of Breath    has been having episodes of SOB coming and going over the weekend and all week. Sometimes it was better but then it would get back bad again. Couldn't breathe laying down or sitting up       HPI  HPI   [name] presented today to the office for follow-up on shortness of breath.  He reports that this increased over the last several days since about last Wednesday.  Did not want to bother anybody thought that it would go away presents today having excessive shortness of breath inability to sit forward has to lean back.  Struggling to catch his air during conversation.  Oxygenation in office 94%.  After ambulating 88%.  Applied 2 L of oxygen via nasal cannula.  After short duration oxygen increased to 97%.  Denied having any chest pain, excessive coughing or sputum production, dizziness, headaches, recent exposure to Covid, fevers, chills or any other signs or symptoms of a possible infection.  No changes in bowel or bladder habits.  No signs or symptoms of urinary tract infection.  Discussed with Mr. Weyman the need to possibly go to the emergency room he agreed to this and was sent to the emergency room at [facility].   Today patient denies signs and symptoms of COVID 19 infection including fever, chills, cough, shortness of breath, and headache. Past Medical, Surgical, Social History, Allergies, and Medications have been Reviewed.   Past Medical History:  Diagnosis Date  . Arterial embolus of lower extremity ([place]) 11/23/2013  . Arthritis   . CAD (coronary artery disease)    a. s/p PCI with 3.5 x 18 BMS to mid RCA, non obst LAD, Lcx patent 08/10/13  . Depression, major, single episode, mild ([place]) 12/20/2019  . Embolus of femoral artery [facility]) 2012   Surgical repair, previously on Coumadin  . Essential hypertension    . ETOH abuse   . History of DVT (deep vein thrombosis) 2011  . History of GI bleed    February 2012   . Ingrown left greater toenail 09/04/2018  . 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 Mycotic aneurysm ([place])    Associated with GI bleed and hepatic artery aneurysm status post embolization February 2012  . Prosthetic valve endocarditis ([place])   . PVD (peripheral vascular disease) ([place])   . S/P aortic valve replacement    Details not clear -  reportedly 1971 per patient    No outpatient medications have been marked as taking for the 01/31/20 encounter (Office Visit) with Perlie Mayo, NP.    ROS:  Review of Systems  Constitutional: Positive for malaise/fatigue.  Respiratory: Positive for cough and shortness of breath. Negative for hemoptysis, sputum production and wheezing.   Cardiovascular: Positive for orthopnea and PND. Negative for chest pain, palpitations and leg swelling.     Objective:   Today's Vitals: BP 132/60   Pulse 75   Temp 98 F (36.7 C) (Temporal)   Resp (!) 22   Ht 5\' 4"  (1.626 m)   Wt 120 lb (54.4 kg)   SpO2 95%   BMI 20.60 kg/m  Vitals with BMI 01/31/2020 01/31/2020 01/31/2020  Height - - -  Weight - - -  BMI - - -  Systolic [id number]  Diastolic 69 63 68  Pulse 93 77 70     Physical Exam Vitals and nursing note reviewed.  Constitutional:      Appearance: Normal appearance. He is well-developed, well-groomed and normal weight.  HENT:     Head: Normocephalic and atraumatic.     Right Ear: External ear normal.     Left Ear: External ear normal.     Nose: Nose normal.  Eyes:     General:        Right eye: No discharge.        Left eye: No discharge.     Conjunctiva/sclera: Conjunctivae normal.     Comments: Mask in place   Cardiovascular:     Rate and Rhythm: Normal rate and regular rhythm.     Pulses: Normal pulses.     Heart sounds: Normal heart sounds.  Pulmonary:     Effort: Tachypnea present.     Breath sounds: Decreased breath sounds  present. No wheezing, rhonchi or rales.  Musculoskeletal:        General: Normal range of motion.     Cervical back: Normal range of motion and neck supple.  Skin:    General: Skin is warm.  Neurological:     General: No focal deficit present.     Mental Status: He is alert and oriented to person, place, and time.  Psychiatric:        Attention and Perception: Attention normal.        Mood and Affect: Mood normal.        Speech: Speech normal.        Behavior: Behavior normal. Behavior is cooperative.        Thought Content: Thought content normal.        Cognition and Memory: Cognition normal.        Judgment: Judgment normal.     Assessment   1. Shortness of breath on exertion   2. Low oxygen saturation     Tests ordered No orders of the defined types were placed in this encounter.    Plan: Please see assessment and plan per problem list above.   No orders of the defined types were placed in this encounter.   Patient to follow-up in post ED  Perlie Mayo, NP

## 2020-01-31 NOTE — Patient Instructions (Addendum)
Sent to the emergency room

## 2020-02-01 ENCOUNTER — Observation Stay (HOSPITAL_COMMUNITY): Payer: Medicare Other

## 2020-02-01 DIAGNOSIS — I5043 Acute on chronic combined systolic (congestive) and diastolic (congestive) heart failure: Secondary | ICD-10-CM | POA: Diagnosis not present

## 2020-02-01 DIAGNOSIS — D500 Iron deficiency anemia secondary to blood loss (chronic): Secondary | ICD-10-CM | POA: Diagnosis not present

## 2020-02-01 DIAGNOSIS — Z7902 Long term (current) use of antithrombotics/antiplatelets: Secondary | ICD-10-CM | POA: Diagnosis not present

## 2020-02-01 DIAGNOSIS — J9601 Acute respiratory failure with hypoxia: Secondary | ICD-10-CM | POA: Diagnosis not present

## 2020-02-01 DIAGNOSIS — K635 Polyp of colon: Secondary | ICD-10-CM | POA: Diagnosis present

## 2020-02-01 DIAGNOSIS — Z87891 Personal history of nicotine dependence: Secondary | ICD-10-CM | POA: Diagnosis not present

## 2020-02-01 DIAGNOSIS — C801 Malignant (primary) neoplasm, unspecified: Secondary | ICD-10-CM | POA: Diagnosis present

## 2020-02-01 DIAGNOSIS — Z20822 Contact with and (suspected) exposure to covid-19: Secondary | ICD-10-CM | POA: Diagnosis not present

## 2020-02-01 DIAGNOSIS — M1990 Unspecified osteoarthritis, unspecified site: Secondary | ICD-10-CM | POA: Diagnosis present

## 2020-02-01 DIAGNOSIS — K5730 Diverticulosis of large intestine without perforation or abscess without bleeding: Secondary | ICD-10-CM | POA: Diagnosis not present

## 2020-02-01 DIAGNOSIS — N132 Hydronephrosis with renal and ureteral calculous obstruction: Secondary | ICD-10-CM | POA: Diagnosis not present

## 2020-02-01 DIAGNOSIS — Z86718 Personal history of other venous thrombosis and embolism: Secondary | ICD-10-CM | POA: Diagnosis not present

## 2020-02-01 DIAGNOSIS — K922 Gastrointestinal hemorrhage, unspecified: Secondary | ICD-10-CM | POA: Diagnosis present

## 2020-02-01 DIAGNOSIS — D62 Acute posthemorrhagic anemia: Secondary | ICD-10-CM | POA: Diagnosis present

## 2020-02-01 DIAGNOSIS — D509 Iron deficiency anemia, unspecified: Secondary | ICD-10-CM | POA: Diagnosis not present

## 2020-02-01 DIAGNOSIS — Z955 Presence of coronary angioplasty implant and graft: Secondary | ICD-10-CM | POA: Diagnosis not present

## 2020-02-01 DIAGNOSIS — K6289 Other specified diseases of anus and rectum: Secondary | ICD-10-CM | POA: Diagnosis not present

## 2020-02-01 DIAGNOSIS — K449 Diaphragmatic hernia without obstruction or gangrene: Secondary | ICD-10-CM | POA: Diagnosis not present

## 2020-02-01 DIAGNOSIS — D649 Anemia, unspecified: Secondary | ICD-10-CM | POA: Diagnosis not present

## 2020-02-01 DIAGNOSIS — I739 Peripheral vascular disease, unspecified: Secondary | ICD-10-CM | POA: Diagnosis present

## 2020-02-01 DIAGNOSIS — Z953 Presence of xenogenic heart valve: Secondary | ICD-10-CM | POA: Diagnosis not present

## 2020-02-01 DIAGNOSIS — D123 Benign neoplasm of transverse colon: Secondary | ICD-10-CM | POA: Diagnosis not present

## 2020-02-01 DIAGNOSIS — I110 Hypertensive heart disease with heart failure: Secondary | ICD-10-CM | POA: Diagnosis present

## 2020-02-01 DIAGNOSIS — R0602 Shortness of breath: Secondary | ICD-10-CM | POA: Diagnosis present

## 2020-02-01 DIAGNOSIS — I2510 Atherosclerotic heart disease of native coronary artery without angina pectoris: Secondary | ICD-10-CM | POA: Diagnosis present

## 2020-02-01 DIAGNOSIS — E871 Hypo-osmolality and hyponatremia: Secondary | ICD-10-CM | POA: Diagnosis present

## 2020-02-01 DIAGNOSIS — K644 Residual hemorrhoidal skin tags: Secondary | ICD-10-CM | POA: Diagnosis not present

## 2020-02-01 DIAGNOSIS — H547 Unspecified visual loss: Secondary | ICD-10-CM | POA: Diagnosis present

## 2020-02-01 DIAGNOSIS — K2100 Gastro-esophageal reflux disease with esophagitis, without bleeding: Secondary | ICD-10-CM | POA: Diagnosis not present

## 2020-02-01 DIAGNOSIS — K5731 Diverticulosis of large intestine without perforation or abscess with bleeding: Secondary | ICD-10-CM | POA: Diagnosis not present

## 2020-02-01 DIAGNOSIS — R0600 Dyspnea, unspecified: Secondary | ICD-10-CM | POA: Diagnosis not present

## 2020-02-01 DIAGNOSIS — E785 Hyperlipidemia, unspecified: Secondary | ICD-10-CM | POA: Diagnosis present

## 2020-02-01 DIAGNOSIS — K2101 Gastro-esophageal reflux disease with esophagitis, with bleeding: Secondary | ICD-10-CM | POA: Diagnosis not present

## 2020-02-01 DIAGNOSIS — C7951 Secondary malignant neoplasm of bone: Secondary | ICD-10-CM | POA: Diagnosis present

## 2020-02-01 LAB — BPAM RBC
Blood Product Expiration Date: 202105202359
Blood Product Expiration Date: 202106172359
ISSUE DATE / TIME: 202105121432
ISSUE DATE / TIME: 202105121752
Unit Type and Rh: 5100
Unit Type and Rh: 5100

## 2020-02-01 LAB — TYPE AND SCREEN
ABO/RH(D): O POS
Antibody Screen: NEGATIVE
Unit division: 0
Unit division: 0

## 2020-02-01 LAB — CBC
HCT: 29.1 % — ABNORMAL LOW (ref 39.0–52.0)
Hemoglobin: 8.7 g/dL — ABNORMAL LOW (ref 13.0–17.0)
MCH: 21.7 pg — ABNORMAL LOW (ref 26.0–34.0)
MCHC: 29.9 g/dL — ABNORMAL LOW (ref 30.0–36.0)
MCV: 72.6 fL — ABNORMAL LOW (ref 80.0–100.0)
Platelets: 354 10*3/uL (ref 150–400)
RBC: 4.01 MIL/uL — ABNORMAL LOW (ref 4.22–5.81)
RDW: 18.8 % — ABNORMAL HIGH (ref 11.5–15.5)
WBC: 10.3 10*3/uL (ref 4.0–10.5)
nRBC: 0 % (ref 0.0–0.2)

## 2020-02-01 LAB — BASIC METABOLIC PANEL
Anion gap: 11 (ref 5–15)
BUN: 14 mg/dL (ref 8–23)
CO2: 26 mmol/L (ref 22–32)
Calcium: 8.2 mg/dL — ABNORMAL LOW (ref 8.9–10.3)
Chloride: 96 mmol/L — ABNORMAL LOW (ref 98–111)
Creatinine, Ser: 1.02 mg/dL (ref 0.61–1.24)
GFR calc Af Amer: >60 mL/min (ref >60)
GFR calc non Af Amer: >60 mL/min (ref >60)
Glucose, Bld: 99 mg/dL (ref 70–99)
Potassium: 3.9 mmol/L (ref 3.5–5.1)
Sodium: 133 mmol/L — ABNORMAL LOW (ref 135–145)

## 2020-02-01 MED ORDER — CEFAZOLIN (ANCEF) 1 G IV SOLR: 2.0000 g | INTRAVENOUS | Status: DC

## 2020-02-01 MED ORDER — PEG 3350-KCL-NA BICARB-NACL 420 G PO SOLR
4000.0000 mL | Freq: Once | ORAL | Status: AC
  Administered 2020-02-01: 4000 mL via ORAL

## 2020-02-01 MED ORDER — CEFAZOLIN SODIUM-DEXTROSE 2-4 GM/100ML-% IV SOLN
2.0000 g | INTRAVENOUS | Status: AC
  Administered 2020-02-02: 2 g via INTRAVENOUS
  Filled 2020-02-01: qty 100

## 2020-02-01 MED ORDER — SODIUM CHLORIDE 0.9 % IV SOLN
510.0000 mg | Freq: Once | INTRAVENOUS | Status: AC
  Administered 2020-02-01: 510 mg via INTRAVENOUS
  Filled 2020-02-01: qty 17

## 2020-02-01 NOTE — Consult Note (Signed)
Referring Provider: Roxan Hockey, MD Primary Care Physician:  [name], MD Primary Gastroenterologist:  Dr. Laural Golden  Reason for Consultation:    Iron deficiency anemia.  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  Dyspnea would quickly resolve with rest.  His dyspnea became worse.  He did not experience chest pain lightheadedness with his dyspnea.  He was seen at his PCPs office yesterday and sent over to the emergency room.  He was noted to have hemoglobin of 6.2 g.  BNP was elevated.  Chest film did not show CHF.  Showed cardiomegaly.  His stool was guaiac negative.  Patient has received 2 units of PRBCs and his hemoglobin is up to 8.7 g and he feels much better.  He has walked to the bathroom without dyspnea. Patient had lab studies done.  B12 and folate levels are normal.  Serum iron and saturation are very low and so his ferritin.  These are reviewing her lab data. Patient denies nausea vomiting heartburn or dysphagia.  He also denies abdominal pain melena or rectal bleeding.  He states his appetite is good.  He states he lost 6 pounds over 6 months ago.  He states he has been smoking marijuana once or twice a week and since then he has gained 10 pounds back.  He is on low-dose aspirin and clopidogrel he does not take other OTC NSAIDs such as BC Goody powder or NSAIDs. He has history of left supraclavicular lymphadenopathy.  He had lymph node biopsy in March 2021.  Histology reveals metastatic carcinoma and  immunohistochemistry suggested urothelial primary.  Patient is under the impression that everything was normal.  Patient has history of GI bleed back in February 2012.  He underwent EGD by Dr. Maurene Capes and found to have blood in duodenum.  CT abdomen suggested hepatic artery pseudoaneurysm for which he underwent embolization.  He has not had any more bleeding episodes. He underwent screening colonoscopy by me in April 20, 2014 revealing sigmoid diverticulosis.  Patient has been disabled for several years.  He is divorced.  He has 2 children.  His son lives in [place] and daughter lives in [place].  They are both in college.  Patient states he quit cigarette smoking several years ago after having smoked for 20 years about a pack a day.  He also does not drink alcohol anymore.  He lost his driving license at age 18 and decided not to get it again.  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 His sister lives close by and helps as does his girlfriend.   Past Medical History:  Diagnosis Date  . Arterial embolus of lower extremity ([place]) 11/23/2013  . Arthritis   . CAD (coronary artery disease)    a. s/p PCI with 3.5 x 18 BMS to mid RCA, non obst LAD, Lcx patent 08/10/13  . Depression, major, single episode, mild ([place]) 12/20/2019  . Embolus of femoral artery [facility]) 2012   Surgical repair, previously on Coumadin  . Essential hypertension   . ETOH abuse   .  History of DVT (deep vein thrombosis) 2011  . History of GI bleed    February 2012   . Ingrown left greater toenail 09/04/2018  . 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 Prosthetic valve endocarditis ([place])   . PVD (peripheral vascular disease) ([place])   . S/P aortic valve replacement    Details not clear -  reportedly 1971 per patient    Past Surgical History:  Procedure Laterality  Date  . ABDOMINAL AORTOGRAM W/LOWER EXTREMITY N/A 10/31/2018   Procedure: ABDOMINAL AORTOGRAM W/LOWER EXTREMITY;  Surgeon: Lorretta Harp, MD;  Location: [place] [facility];  Service: Cardiovascular;  Laterality: N/A;  . Aoric valve replacement  January 2005   81mm Medtronic Freestyle stentless porcine bioprosthesis [facility])  . CARDIAC CATHETERIZATION     30-40% mid LAD, 20-30% ostial D1, proximal 30-40% RCA, 95% mid RCA thrombus, 40% distal stenosis. EF 45%, inferior HK.  [name] COLONOSCOPY N/A 04/20/2014   Procedure: COLONOSCOPY;  Surgeon: Rogene Houston, MD;  [facility];  Service: Endoscopy;  Laterality: N/A;  930  . CORONARY ANGIOPLASTY WITH STENT PLACEMENT  07/2013  . INGUINAL HERNIA REPAIR Bilateral 04/17/2019   Procedure: ATTEMPTED LAPAROSCOPIC BILATERAL INGUINAL HERNIA REPAIR;  Surgeon: Coralie Keens, MD;  Location: [place];  Service: General;  Laterality: Bilateral;  . INGUINAL HERNIA REPAIR Bilateral 04/17/2019   Procedure: Hernia Repair Inguinal Adult;  Surgeon: Coralie Keens, MD;  Location: [place];  Service: General;  Laterality: Bilateral;  . LEFT HEART CATHETERIZATION WITH CORONARY ANGIOGRAM N/A 08/10/2013   Procedure: LEFT HEART CATHETERIZATION WITH CORONARY ANGIOGRAM;  Surgeon: Blane Ohara, MD;  Location: [facility];  Service: Cardiovascular;  Laterality: N/A;  . Left leg embolectomy  May 2012   Dr. Trula Slade - reason for lifelong coumadin  . LYMPH NODE BIOPSY Left 11/21/2019   Procedure: Left Neck LYMPH NODE BIOPSY;  Surgeon: Rozetta Nunnery, MD;  Location: [place];  Service: ENT;  Laterality: Left;  . PERIPHERAL VASCULAR ATHERECTOMY  10/31/2018   Procedure: PERIPHERAL VASCULAR ATHERECTOMY;  Surgeon: Lorretta Harp, MD;  Location: [place] [facility];  Service: Cardiovascular;;  left SFA    Prior to Admission medications   Medication Sig Start Date End Date Taking? Authorizing Provider  aspirin EC 81 MG tablet Take 1 tablet (81 mg total) by mouth daily.  11/01/17  Yes [name], MD  atorvastatin (LIPITOR) 40 MG tablet Take 1 tablet by mouth once daily 12/21/19  Yes Perlie Mayo, NP  cholecalciferol (VITAMIN D) 1000 units tablet Take 1 tablet (1,000 Units total) by mouth daily. 11/01/17  Yes [name], MD  clopidogrel (PLAVIX) 75 MG tablet Take 1 tablet by mouth once daily Patient taking differently: Take 75 mg by mouth daily.  08/29/19  Yes [name], MD  fluticasone (FLONASE) 50 MCG/ACT nasal spray Place 2 sprays into both nostrils daily. Patient taking differently: Place 2 sprays into both nostrils daily as needed for allergies.  03/13/19  Yes [name], MD  furosemide (LASIX) 40 MG tablet Take 1 tablet by mouth once daily 12/21/19  Yes Perlie Mayo, NP  lisinopril (ZESTRIL) 20 MG tablet Take 1 tablet by mouth once daily 12/21/19  Yes Perlie Mayo, NP  metoprolol succinate (TOPROL-XL) 50 MG 24 hr tablet TAKE 1 TABLET BY MOUTH ONCE DAILY WITH  OR  IMMEDIATELY  FOLLOWING  A  MEAL 12/21/19  Yes Perlie Mayo, NP  montelukast (SINGULAIR) 10 MG tablet TAKE 1 TABLET BY MOUTH AT BEDTIME 12/21/19  Yes Perlie Mayo, NP  nitroGLYCERIN (NITROSTAT) 0.4 MG SL tablet Place 1 tablet (0.4 mg total) under the tongue every 5 (five) minutes as needed for chest pain. 12/20/19  Yes Lorretta Harp, MD  trolamine salicylate (ASPERCREME) 10 % cream Apply 1 application topically as needed for muscle pain.   Yes [provider]  busPIRone (BUSPAR) 10 MG tablet Take 1 tablet (10 mg total) by mouth 2 (two) times daily. Patient not taking: Reported on 01/31/2020 12/20/19   Perlie Mayo, NP  escitalopram (LEXAPRO) 10 MG tablet Take 1 tablet (10 mg total) by mouth daily. Patient not taking: Reported on 01/31/2020 12/20/19   Perlie Mayo, NP    Current Facility-Administered Medications  Medication Dose Route Frequency Provider Last Rate Last Admin  . 0.9 %  sodium chloride infusion  250 mL Intravenous PRN Emokpae, Courage,  MD      . atorvastatin (LIPITOR) tablet 40 mg  40 mg Oral Daily Emokpae, Courage, MD   40 mg at 02/01/20 1025  . busPIRone (BUSPAR) tablet 10 mg  10 mg Oral BID Roxan Hockey, MD   10 mg at 02/01/20 1025  . cholecalciferol (VITAMIN D3) tablet 1,000 Units  1,000 Units Oral Daily Roxan Hockey, MD   1,000 Units at 02/01/20 1025  . escitalopram (LEXAPRO) tablet 10 mg  10 mg Oral Daily Emokpae, Courage, MD   10 mg at 02/01/20 1026  . furosemide (LASIX) tablet 40 mg  40 mg Oral Daily Emokpae, Courage, MD   40 mg at 02/01/20 1025  . lisinopril (ZESTRIL) tablet 20 mg  20 mg Oral Daily Emokpae, Courage, MD   20 mg at 02/01/20 1025  . metoprolol succinate (TOPROL-XL) 24 hr tablet 50 mg  50 mg Oral Daily Emokpae, Courage, MD   50 mg at 02/01/20 1026  . montelukast (SINGULAIR) tablet 10 mg  10 mg Oral QHS Emokpae, Courage, MD   10 mg at 01/31/20 2243  . nitroGLYCERIN (NITROSTAT) SL tablet 0.4 mg  0.4 mg Sublingual Q5 min PRN Emokpae, Courage, MD      . ondansetron (ZOFRAN) tablet 4 mg  4 mg Oral Q6H PRN Emokpae, Courage, MD       Or  . ondansetron (ZOFRAN) injection 4 mg  4 mg Intravenous Q6H PRN Emokpae, Courage, MD      . pantoprazole (PROTONIX) injection 40 mg  40 mg Intravenous Q12H Emokpae, Courage, MD   40 mg at 02/01/20 1025  . polyethylene glycol (MIRALAX / GLYCOLAX) packet 17 g  17 g Oral Daily PRN Emokpae, Courage, MD      . sodium chloride flush (NS) 0.9 % injection 3 mL  3 mL Intravenous Q12H Emokpae, Courage, MD   3 mL at 02/01/20 1026  . sodium chloride flush (NS) 0.9 % injection 3 mL  3 mL Intravenous PRN Emokpae, Courage, MD      . traZODone (DESYREL) tablet 50 mg  50 mg Oral QHS PRN Denton Brick, Courage, MD   50 mg at 02/01/20 0126    Allergies as of 01/31/2020  . (No Known Allergies)    Family History  Problem Relation Age of Onset  . Diabetes Mother   . Cirrhosis Father   . COPD Sister     Social History   Socioeconomic History  . Marital status: Single  Spouse name:  Not on file  . Number of children: 2  . Years of education: Not on file  . Highest education level: 11th grade  Occupational History  . Occupation: disability  Tobacco Use  . Smoking status: Former Smoker    Packs/day: 0.50    Years: 3.00    Pack years: 1.50    Types: Cigarettes    Start date: 10/12/1965    Quit date: 02/19/1970    Years since quitting: 49.9  . Smokeless tobacco: Never Used  Substance and Sexual Activity  . Alcohol use: Not Currently  . Drug use: Yes    Types: Marijuana    Comment: pt smokes weed once in the morning and once in the evening  . Sexual activity: Not Currently  Other Topics Concern  . Not on file  Social History Narrative  . Not on file   Social Determinants of Health   Financial Resource Strain:   . Difficulty of Paying Living Expenses:   Food Insecurity:   . Worried About Charity fundraiser in the Last Year:   . Arboriculturist in the Last Year:   Transportation Needs:   . Film/video editor (Medical):   [name] Lack of Transportation (Non-Medical):   Physical Activity:   . Days of Exercise per Week:   . Minutes of Exercise per Session:   Stress:   . Feeling of Stress :   Social Connections:   . Frequency of Communication with Friends and Family:   . Frequency of Social Gatherings with Friends and Family:   . Attends Religious Services:   . Active Member of Clubs or Organizations:   . Attends Archivist Meetings:   [name] Marital Status:   Intimate Partner Violence:   . Fear of Current or Ex-Partner:   . Emotionally Abused:   [name] Physically Abused:   . Sexually Abused:     Review of Systems: See HPI, otherwise normal ROS  Physical Exam: Temp:  [97.6 F (36.4 C)-99.6 F (37.6 C)] 98.5 F (36.9 C) (05/13 0554) Pulse Rate:  [51-97] 66 (05/13 0554) Resp:  [13-30] 16 (05/13 0554) BP: (92-152)/(40-102) 118/64 (05/13 0554) SpO2:  [90 %-100 %] 100 % (05/13 0554) Weight:  [52.8 kg-52.9 kg] 52.9 kg (05/13 0200) Last BM Date:  01/31/20  Well-developed thin African-American in no acute distress. Conjunctiva is pale and sclerae nonicteric. Oropharyngeal mucosa is normal. Dentition in satisfactory condition. He has left supra clavicular adenopathy.  Lymph nodes are prominent but freely mobile.  These are firm and nontender.  No thyromegaly noted. He has midsternal scar. Cardiac exam with regular rhythm normal S1 with loud S2.  He has both systolic ejection murmur as well as early diastolic murmur.  Grade 2-6. Auscultation lungs reveal vesicular breath sounds bilaterally. Abdomen is symmetrical.  He has bilateral inguinal herniorrhaphy scars.  Bowel sounds are normal.  He has small umbilical subcutaneous nodule which is firm and and mobile.  It is not tender.  Abdomen is soft and nontender.  Liver edge is palpable.  Not tender.  Liver does not appear to be enlarged. Patient does not have clubbing koilonychia or peripheral edema.   Intake/Output from previous day: 05/12 0701 - 05/13 0700 In: 668.4 [I.V.:8.4; Blood:660] Out: 3300 [Urine:3300] Intake/Output this shift: Total I/O In: 240 [P.O.:240] Out: -   Lab Results: Recent Labs    01/31/20 1149 02/01/20 0609  WBC 10.1 10.3  HGB 6.2* 8.7*  HCT 21.3* 29.1*  PLT 334  354   BMET Recent Labs    01/31/20 1149 02/01/20 0609  NA 128* 133*  K 3.8 3.9  CL 97* 96*  CO2 23 26  GLUCOSE 89 99  BUN 14 14  CREATININE 0.89 1.02  CALCIUM 8.1* 8.2*   LFT Recent Labs    01/31/20 1149  PROT 6.6  ALBUMIN 3.1*  AST 18  ALT 16  ALKPHOS 129*  BILITOT 0.8   PT/INR Recent Labs    01/31/20 1254  LABPROT 14.5  INR 1.2   Serum iron 19 TIBC 332 and saturation 3%. Serum ferritin 5 Serum folate 11.8 Serum B12 level 243.  BNP on admission 1406.  Studies/Results: DG Chest Port 1 View  Result Date: 02/01/2020 CLINICAL DATA:  Dyspnea EXAM: PORTABLE CHEST 1 VIEW COMPARISON:  Yesterday FINDINGS: Transcatheter aortic valve replacement. Cardiomegaly is  unchanged. Aortic tortuosity. There is no edema, consolidation, effusion, or pneumothorax. Nipple shadow noted on the left. Embolization coils over the right upper quadrant. IMPRESSION: Stable exam.  No evidence of acute disease. Electronically Signed   By: Monte Fantasia M.D.   On: 02/01/2020 09:40   DG Chest Portable 1 View  Result Date: 01/31/2020 CLINICAL DATA:  Shortness of breath. EXAM: PORTABLE CHEST 1 VIEW COMPARISON:  August 09, 2013. FINDINGS: Stable cardiomegaly. Status post aortic valve repair as well as probable stent graft in ascending thoracic aorta. No pneumothorax or pleural effusion is noted. Lungs are clear. Bony thorax is unremarkable. IMPRESSION: No acute cardiopulmonary abnormality seen. Electronically Signed   By: Marijo Conception M.D.   On: 01/31/2020 11:45    Assessment;  Patient is 68 year old African-American male with complicated cardiac history who has prostatic aortic valve as well as history of coronary artery disease on low-dose aspirin and clopidogrel who presents with exertional dyspnea and found to be profoundly anemic.  Iron studies confirm iron deficiency anemia.  B12 and folate levels are normal.  His stool is guaiac negative and there is no history of melena or rectal bleeding.  He has remote history of GI bleed secondary to hepatic artery pseudoaneurysm treated with embolization in 2012.  Last colonoscopy was in July 2015 revealing colonic diverticulosis.  He could have iron malabsorption but need to rule out GI angiodysplasia peptic ulcer disease or occult neoplasm. Patient will need SBE prophylaxis given history of prosthetic valve endocarditis.  Mildly elevated high sensitive troponin level most likely due to demand ischemia.  Recent lymph node biopsy positive for metastatic carcinoma.  Patient not aware of this diagnosis.  Last PSA was mildly elevated.  We will check it again.   Recommendations;  Check PSA with next blood work. BNP in  a.m. Esophagogastroduodenoscopy and colonoscopy under monitored anesthesia care on 02/02/2020. Patient will be prepped with GoLYTELY this afternoon. He will be given SV prophylaxis with 2 g of Ancef prior to the procedure.   LOS: 0 days   Johna Kearl  02/01/2020, 11:10 AM

## 2020-02-01 NOTE — Progress Notes (Addendum)
PROGRESS NOTE    [name]  [id number] DOB: 10-14-1950 DOA: 01/31/2020 PCP: [name], MD   Brief Narrative:  69 year old with history of PAD, arterial embolus to lower extremity, CAD, HTN, chronic anemia, TAVR on aspirin Plavix admitted for progressive shortness of breath found to have hemoglobin of 6.2. Hemoccult negative. Mild hyponatremia and elevated BNP.  GI team consulted.   Assessment & Plan:   Principal Problem:   Symptomatic anemia--acute on chronic secondary to acute blood loss Active Problems:   Essential hypertension, benign   S/P aortic valve replacement   Coronary atherosclerosis of native coronary artery   Peripheral arterial disease (HCC)   Claudication in peripheral vascular disease (HCC)   Acute GI bleeding   Acute on chronic HFrEF (heart failure with reduced ejection fraction) -combined systolic and diastolic dysfunction CHF    Acute on chronic iron deficiency anemia, microcytic Severe iron deficiency anemia.. Baseline hemoglobin around 8.5, admission hemoglobin is around six.  Spoke with outpatient provider, his baseline hemoglobin is around 13-15. GI has been consulted for possible need for endoscopic evaluation Protonix 40 mg IV twice daily Maintain NPO. Monitor respiratory status closely. In the ER Hemoccult negative. Status post 2 units PRBC transfusion. IV iron today and we can start p.o. tomorrow with bowel regimen once able to take p.o..  Acute hypoxic respiratory failure secondary to anemia and elevated BNP. Congestive heart failure with reduced ejection fraction, EF 45%. Class III Repeat chest x-ray today. Continue p.o. Lasix, will give IV as necessary. Continue Toprol-XL, lisinopril.  PAD/ H /o CAD--prior history of bare-metal stent to mid RCA aortic valve repair/TAVR as well as stent graft in ascending thoracic aorta Antiplatelets currently on hold due to bleeding. Resume once cleared by  GI.  Hyperlipidemia Statin.  History of depression Continue home meds when able to take p.o.  Left neck incisional biopsy back in March showing metastatic carcinoma Unknown primary.  PSA has been ordered.  I will notify primary care provider to follow this up.   DVT prophylaxis: SCDs Code Status: Full code Family Communication:    Status is: Inpatient  Remains inpatient appropriate because:Hemodynamically unstable   Dispo: The patient is from: Home              Anticipated d/c is to: Home              Anticipated d/c date is: 2 days              Patient currently is not medically stable to d/c.  Patient will need colonoscopy evaluation.    Subjective: At rest for now he feels okay.  He has had significant amount of exertional dyspnea and occasional blood in his stool.  Review of Systems Otherwise negative except as per HPI, including: General: Denies fever, chills, night sweats or unintended weight loss. Resp: Denies cough, wheezing, shortness of breath. Cardiac: Denies chest pain, palpitations, orthopnea, paroxysmal nocturnal dyspnea. GI: Denies abdominal pain, nausea, vomiting, diarrhea or constipation GU: Denies dysuria, frequency, hesitancy or incontinence MS: Denies muscle aches, joint pain or swelling Neuro: Denies headache, neurologic deficits (focal weakness, numbness, tingling), abnormal gait Psych: Denies anxiety, depression, SI/HI/AVH Skin: Denies new rashes or lesions ID: Denies sick contacts, exotic exposures, travel  Examination:  General exam: Appears calm and comfortable  Respiratory system: Minimal bibasilar crackles. Cardiovascular system: S1 & S2 heard, RRR. No JVD, murmurs, rubs, gallops or clicks. No pedal edema. Gastrointestinal system: Abdomen is nondistended, soft and nontender. No organomegaly or masses  felt. Normal bowel sounds heard. Central nervous system: Alert and oriented. No focal neurological deficits. Extremities: Symmetric 5 x 5  power. Skin: No rashes, lesions or ulcers Psychiatry: Judgement and insight appear normal. Mood & affect appropriate.     Objective: Vitals:   01/31/20 2100 01/31/20 2156 02/01/20 0200 02/01/20 0554  BP: 137/70 (!) 145/73 94/60 118/64  Pulse: 67 73 68 66  Resp: (!) 26 16 16 16   Temp:  98.6 F (37 C) 99.6 F (37.6 C) 98.5 F (36.9 C)  TempSrc:  Oral Oral Oral  SpO2: 99% 100% 100% 100%  Weight: 52.8 kg  52.9 kg   Height: 5\' 4"  (1.626 m)       Intake/Output Summary (Last 24 hours) at 02/01/2020 0750 Last data filed at 02/01/2020 0554 Gross per 24 hour  Intake 668.38 ml  Output 3300 ml  Net -2631.62 ml   Filed Weights   01/31/20 1107 01/31/20 2100 02/01/20 0200  Weight: 53.5 kg 52.8 kg 52.9 kg     Data Reviewed:   CBC: Recent Labs  Lab 01/31/20 1149  WBC 10.1  NEUTROABS 7.6  HGB 6.2*  HCT 21.3*  MCV 70.1*  PLT [id number]   Basic Metabolic Panel: Recent Labs  Lab 01/31/20 1149 02/01/20 0609  NA 128* 133*  K 3.8 3.9  CL 97* 96*  CO2 23 26  GLUCOSE 89 99  BUN 14 14  CREATININE 0.89 1.02  CALCIUM 8.1* 8.2*   GFR: Estimated Creatinine Clearance: 51.9 mL/min (by C-G formula based on SCr of 1.02 mg/dL). Liver Function Tests: Recent Labs  Lab 01/31/20 1149  AST 18  ALT 16  ALKPHOS 129*  BILITOT 0.8  PROT 6.6  ALBUMIN 3.1*   No results for input(s): LIPASE, AMYLASE in the last 168 hours. No results for input(s): AMMONIA in the last 168 hours. Coagulation Profile: Recent Labs  Lab 01/31/20 1254  INR 1.2   Cardiac Enzymes: No results for input(s): CKTOTAL, CKMB, CKMBINDEX, TROPONINI in the last 168 hours. BNP (last 3 results) No results for input(s): PROBNP in the last 8760 hours. HbA1C: No results for input(s): HGBA1C in the last 72 hours. CBG: No results for input(s): GLUCAP in the last 168 hours. Lipid Profile: No results for input(s): CHOL, HDL, LDLCALC, TRIG, CHOLHDL, LDLDIRECT in the last 72 hours. Thyroid Function Tests: No results for  input(s): TSH, T4TOTAL, FREET4, T3FREE, THYROIDAB in the last 72 hours. Anemia Panel: Recent Labs    01/31/20 1254  VITAMINB12 243  FOLATE 11.8  FERRITIN 5*  TIBC 332  IRON 9*  RETICCTPCT 1.8   Sepsis Labs: No results for input(s): PROCALCITON, LATICACIDVEN in the last 168 hours.  Recent Results (from the past 240 hour(s))  SARS Coronavirus 2 by RT PCR (hospital order, performed in [facility] hospital lab) Nasopharyngeal Nasopharyngeal Swab     Status: None   Collection Time: 01/31/20 11:24 AM   Specimen: Nasopharyngeal Swab  Result Value Ref Range Status   SARS Coronavirus 2 NEGATIVE NEGATIVE Final    Comment: (NOTE) SARS-CoV-2 target nucleic acids are NOT DETECTED. The SARS-CoV-2 RNA is generally detectable in upper and lower respiratory specimens during the acute phase of infection. The lowest concentration of SARS-CoV-2 viral copies this assay can detect is 250 copies / mL. A negative result does not preclude SARS-CoV-2 infection and should not be used as the sole basis for treatment or other patient management decisions.  A negative result may occur with improper specimen collection / handling, submission of specimen  other than nasopharyngeal swab, presence of viral mutation(s) within the areas targeted by this assay, and inadequate number of viral copies (<250 copies / mL). A negative result must be combined with clinical observations, patient history, and epidemiological information. Fact Sheet for Patients:   StrictlyIdeas.no Fact Sheet for Healthcare Providers: BankingDealers.co.za This test is not yet approved or cleared  by the [place] FDA and has been authorized for detection and/or diagnosis of SARS-CoV-2 by FDA under an Emergency Use Authorization (EUA).  This EUA will remain in effect (meaning this test can be used) for the duration of the COVID-19 declaration under Section 564(b)(1) of the Act, 21  U.S.C. section 360bbb-3(b)(1), unless the authorization is terminated or revoked sooner. Performed at [facility], [address], [place], [place] [address]          Radiology Studies: DG Chest Portable 1 View  Result Date: 01/31/2020 CLINICAL DATA:  Shortness of breath. EXAM: PORTABLE CHEST 1 VIEW COMPARISON:  August 09, 2013. FINDINGS: Stable cardiomegaly. Status post aortic valve repair as well as probable stent graft in ascending thoracic aorta. No pneumothorax or pleural effusion is noted. Lungs are clear. Bony thorax is unremarkable. IMPRESSION: No acute cardiopulmonary abnormality seen. Electronically Signed   By: Marijo Conception M.D.   On: 01/31/2020 11:45        Scheduled Meds: . atorvastatin  40 mg Oral Daily  . busPIRone  10 mg Oral BID  . cholecalciferol  1,000 Units Oral Daily  . escitalopram  10 mg Oral Daily  . furosemide  40 mg Oral Daily  . lisinopril  20 mg Oral Daily  . metoprolol succinate  50 mg Oral Daily  . montelukast  10 mg Oral QHS  . pantoprazole (PROTONIX) IV  40 mg Intravenous Q12H  . sodium chloride flush  3 mL Intravenous Q12H   Continuous Infusions: . sodium chloride       LOS: 0 days   Time spent= 35 mins    Sabrena Gavitt Arsenio Loader, MD [facility]  If 7PM-7AM, please contact night-coverage  02/01/2020, 7:50 AM

## 2020-02-01 NOTE — Progress Notes (Addendum)
After starting prep and some brown, loose bowel movement began to vomit.   Will give zofran.  Contacted Dr. Reesa Chew and Dr. Laural Golden.  Consent signed and on chart

## 2020-02-02 ENCOUNTER — Inpatient Hospital Stay (HOSPITAL_COMMUNITY): Payer: Medicare Other

## 2020-02-02 ENCOUNTER — Inpatient Hospital Stay (HOSPITAL_COMMUNITY): Payer: Medicare Other | Admitting: Anesthesiology

## 2020-02-02 ENCOUNTER — Encounter (HOSPITAL_COMMUNITY)
Admission: EM | Disposition: A | Discharge status: Home / Self Care | Payer: Self-pay | Source: Home / Self Care | Attending: Internal Medicine

## 2020-02-02 DIAGNOSIS — K5730 Diverticulosis of large intestine without perforation or abscess without bleeding: Secondary | ICD-10-CM

## 2020-02-02 DIAGNOSIS — D123 Benign neoplasm of transverse colon: Secondary | ICD-10-CM

## 2020-02-02 DIAGNOSIS — K644 Residual hemorrhoidal skin tags: Secondary | ICD-10-CM

## 2020-02-02 DIAGNOSIS — N429 Disorder of prostate, unspecified: Secondary | ICD-10-CM

## 2020-02-02 DIAGNOSIS — K449 Diaphragmatic hernia without obstruction or gangrene: Secondary | ICD-10-CM

## 2020-02-02 DIAGNOSIS — K2100 Gastro-esophageal reflux disease with esophagitis, without bleeding: Secondary | ICD-10-CM

## 2020-02-02 HISTORY — PX: ESOPHAGOGASTRODUODENOSCOPY (EGD) WITH PROPOFOL: SHX5813

## 2020-02-02 HISTORY — PX: COLONOSCOPY WITH PROPOFOL: SHX5780

## 2020-02-02 HISTORY — PX: POLYPECTOMY: SHX5525

## 2020-02-02 LAB — COMPREHENSIVE METABOLIC PANEL
ALT: 15 U/L (ref 0–44)
AST: 19 U/L (ref 15–41)
Albumin: 3.3 g/dL — ABNORMAL LOW (ref 3.5–5.0)
Alkaline Phosphatase: 138 U/L — ABNORMAL HIGH (ref 38–126)
Anion gap: 9 (ref 5–15)
BUN: 11 mg/dL (ref 8–23)
CO2: 26 mmol/L (ref 22–32)
Calcium: 8.8 mg/dL — ABNORMAL LOW (ref 8.9–10.3)
Chloride: 97 mmol/L — ABNORMAL LOW (ref 98–111)
Creatinine, Ser: 0.99 mg/dL (ref 0.61–1.24)
GFR calc Af Amer: >60 mL/min (ref >60)
GFR calc non Af Amer: >60 mL/min (ref >60)
Glucose, Bld: 92 mg/dL (ref 70–99)
Potassium: 3.9 mmol/L (ref 3.5–5.1)
Sodium: 132 mmol/L — ABNORMAL LOW (ref 135–145)
Total Bilirubin: 1.4 mg/dL — ABNORMAL HIGH (ref 0.3–1.2)
Total Protein: 6.8 g/dL (ref 6.5–8.1)

## 2020-02-02 LAB — CBC
HCT: 29.2 % — ABNORMAL LOW (ref 39.0–52.0)
Hemoglobin: 8.7 g/dL — ABNORMAL LOW (ref 13.0–17.0)
MCH: 21.8 pg — ABNORMAL LOW (ref 26.0–34.0)
MCHC: 29.8 g/dL — ABNORMAL LOW (ref 30.0–36.0)
MCV: 73 fL — ABNORMAL LOW (ref 80.0–100.0)
Platelets: 357 10*3/uL (ref 150–400)
RBC: 4 MIL/uL — ABNORMAL LOW (ref 4.22–5.81)
RDW: 18.8 % — ABNORMAL HIGH (ref 11.5–15.5)
WBC: 9.5 10*3/uL (ref 4.0–10.5)
nRBC: 0 % (ref 0.0–0.2)

## 2020-02-02 LAB — PSA: Prostatic Specific Antigen: 2.23 ng/mL (ref 0.00–4.00)

## 2020-02-02 LAB — MAGNESIUM: Magnesium: 2.1 mg/dL (ref 1.7–2.4)

## 2020-02-02 LAB — PROCALCITONIN: Procalcitonin: <0.1 ng/mL

## 2020-02-02 LAB — BRAIN NATRIURETIC PEPTIDE: B Natriuretic Peptide: 1226 pg/mL — ABNORMAL HIGH (ref 0.0–100.0)

## 2020-02-02 SURGERY — ESOPHAGOGASTRODUODENOSCOPY (EGD) WITH PROPOFOL
Anesthesia: General

## 2020-02-02 MED ORDER — STERILE WATER FOR IRRIGATION IR SOLN
Status: DC | PRN
  Administered 2020-02-02: 2.5 mL

## 2020-02-02 MED ORDER — PANTOPRAZOLE SODIUM 40 MG PO TBEC
40.0000 mg | DELAYED_RELEASE_TABLET | Freq: Every day | ORAL | Status: DC
  Administered 2020-02-03: 40 mg via ORAL
  Filled 2020-02-02 (×2): qty 1

## 2020-02-02 MED ORDER — EPHEDRINE SULFATE 50 MG/ML IJ SOLN
INTRAMUSCULAR | Status: DC | PRN
  Administered 2020-02-02 (×2): 10 mg via INTRAVENOUS

## 2020-02-02 MED ORDER — LACTATED RINGERS IV SOLN: INTRAVENOUS | Status: DC | PRN

## 2020-02-02 MED ORDER — KETAMINE HCL 50 MG/5ML IJ SOSY
PREFILLED_SYRINGE | INTRAMUSCULAR | Status: AC
  Filled 2020-02-02: qty 5

## 2020-02-02 MED ORDER — IOHEXOL 300 MG/ML  SOLN
100.0000 mL | Freq: Once | INTRAMUSCULAR | Status: AC | PRN
  Administered 2020-02-02: 100 mL via INTRAVENOUS

## 2020-02-02 MED ORDER — IOHEXOL 9 MG/ML PO SOLN
ORAL | Status: AC
  Administered 2020-02-02: 1000 mL
  Filled 2020-02-02: qty 1000

## 2020-02-02 MED ORDER — PROPOFOL 10 MG/ML IV BOLUS
INTRAVENOUS | Status: AC
  Filled 2020-02-02: qty 20

## 2020-02-02 MED ORDER — PROPOFOL 500 MG/50ML IV EMUL
INTRAVENOUS | Status: DC | PRN
  Administered 2020-02-02: 150 ug/kg/min via INTRAVENOUS

## 2020-02-02 MED ORDER — FUROSEMIDE 40 MG PO TABS
40.0000 mg | ORAL_TABLET | Freq: Two times a day (BID) | ORAL | Status: DC
  Administered 2020-02-02 – 2020-02-03 (×2): 40 mg via ORAL
  Filled 2020-02-02 (×2): qty 1

## 2020-02-02 MED ORDER — KETAMINE HCL 10 MG/ML IJ SOLN
INTRAMUSCULAR | Status: DC | PRN
  Administered 2020-02-02: 20 mg via INTRAVENOUS

## 2020-02-02 NOTE — Op Note (Addendum)
[facility] Patient Name: [name] Procedure Date: 02/02/2020 11:28 AM MRN: [id number] Date of Birth: September 05, 1951 Attending MD: Hildred Laser , MD CSN: [id number] Age: 69 Admit Type: Inpatient Procedure:                Colonoscopy Indications:              Unexplained iron deficiency anemia Providers:                Hildred Laser, MD, Lurline Del, RN, Nelma Rothman,                            Technician Referring MD:             Lanier Prude Amin, MD Medicines:                Propofol per Anesthesia Complications:            No immediate complications. Estimated Blood Loss:     Estimated blood loss was minimal. Procedure:                After obtaining informed consent, the colonoscope                            was passed under direct vision. Throughout the                            procedure, the patient's blood pressure, pulse, and                            oxygen saturations were monitored continuously. The                            PCF-[id number]) scope was introduced through                            the anus and advanced to the the cecum, identified                            by appendiceal orifice and ileocecal valve. The                            colonoscopy was performed without difficulty. The                            patient tolerated the procedure well. The quality                            of the bowel preparation was good. The ileocecal                            valve, appendiceal orifice, and rectum were                            photographed. Scope In: 1:32:26 PM Scope Out: 1:53:19 PM Scope Withdrawal Time: 0 hours 14 minutes 32 seconds  Total Procedure Duration: 0 hours 20 minutes 53 seconds  Findings:  The digital rectal exam findings include decreased sphincter tone and       increased firmness of the prostate.      A small polyp was found in the splenic flexure. The polyp was sessile.       The polyp was removed with a cold snare. Resection  and retrieval were       complete.      A few diverticula were found in the sigmoid colon.      External hemorrhoids were found during retroflexion. The hemorrhoids       were small. Impression:               - Decreased sphincter tone and increased firmness                            of the prostate found on digital rectal exam.                           - One small polyp at the splenic flexure, removed                            with a cold snare. Resected and retrieved.                           - Diverticulosis in the sigmoid colon.                           - External hemorrhoids. Moderate Sedation:      Per Anesthesia Care Recommendation:           - Return patient to hospital ward for ongoing care.                           - Cardiac diet today.                           - Continue present medications.                           - Resume Plavix (clopidogrel) at prior dose                            tomorrow.                           - Await pathology results.                           - To visualize the small bowel, perform video                            capsule endoscopy at appointment to be scheduled as                            outpatient. Procedure Code(s):        --- Professional ---  45385, Colonoscopy, flexible; with removal of                            tumor(s), polyp(s), or other lesion(s) by snare                            technique Diagnosis Code(s):        --- Professional ---                           K62.89, Other specified diseases of anus and rectum                           N42.9, Disorder of prostate, unspecified                           K63.5, Polyp of colon                           K64.4, Residual hemorrhoidal skin tags                           D50.9, Iron deficiency anemia, unspecified                           K57.30, Diverticulosis of large intestine without                            perforation or abscess without  bleeding CPT copyright 2019 American Medical Association. All rights reserved. The codes documented in this report are preliminary and upon coder review may  be revised to meet current compliance requirements. Hildred Laser, MD Hildred Laser, MD 02/02/2020 2:12:46 PM This report has been signed electronically. Number of Addenda: 0

## 2020-02-02 NOTE — Transfer of Care (Signed)
Immediate Anesthesia Transfer of Care Note  Patient: [name]  Procedure(s) Performed: ESOPHAGOGASTRODUODENOSCOPY (EGD) WITH PROPOFOL (N/A ) COLONOSCOPY WITH PROPOFOL (N/A ) POLYPECTOMY  Patient Location: PACU  Anesthesia Type:General  Level of Consciousness: awake, alert  and oriented  Airway & Oxygen Therapy: Patient Spontanous Breathing  Post-op Assessment: Report given to RN, Post -op Vital signs reviewed and stable and Patient moving all extremities X 4  Post vital signs: Reviewed and stable  Last Vitals:  Vitals Value Taken Time  BP    Temp    Pulse    Resp    SpO2      Last Pain:  Vitals:   02/02/20 1158  TempSrc: Oral  PainSc: 0-No pain      Patients Stated Pain Goal: 5 ([id number])  Complications: No apparent anesthesia complications

## 2020-02-02 NOTE — Plan of Care (Signed)

## 2020-02-02 NOTE — Anesthesia Preprocedure Evaluation (Signed)
Anesthesia Evaluation  Patient identified by MRN, date of birth, ID band Patient awake    Reviewed: Allergy & Precautions, NPO status , Patient's Chart, lab work & pertinent test results, reviewed documented beta blocker date and time   Airway Mallampati: II  TM Distance: >3 FB Neck ROM: Full    Dental  (+) Dental Advisory Given, Poor Dentition   Pulmonary former smoker,    Pulmonary exam normal breath sounds clear to auscultation       Cardiovascular Exercise Tolerance: Poor hypertension, Pt. on medications and Pt. on home beta blockers + CAD, + Cardiac Stents, + Peripheral Vascular Disease and +CHF  Normal cardiovascular exam+ Valvular Problems/Murmurs (AVR) AI  Rhythm:Regular Rate:Normal + Diastolic murmurs [id number] 11:13:50 [facility] ROUTINE RECORD Sinus rhythm Prolonged PR interval Probable left atrial enlargement LVH w/ repol abnormalities, possible ischemia Similar to prior. No STEMI Confirmed by Nanda Quinton [phone]) on 01/31/2020 11:49:25 AM   Neuro/Psych PSYCHIATRIC DISORDERS Depression    GI/Hepatic GERD  Medicated,(+)     substance abuse  marijuana use, H/O GI BLEED   Endo/Other    Renal/GU      Musculoskeletal  (+) Arthritis ,   Abdominal   Peds  Hematology  (+) anemia ,   Anesthesia Other Findings 1. Left ventricular ejection fraction, by estimation, is 45 to 50%. The  left ventricle has mildly decreased function. The left ventricle  demonstrates regional wall motion abnormalities (see scoring  diagram/findings for description). The left ventricular  internal cavity size was mildly dilated. There is mild left ventricular  hypertrophy. Left ventricular diastolic parameters are consistent with  Grade I diastolic dysfunction (impaired relaxation).  2. Right ventricular systolic function is normal. The right ventricular  size is normal. There is mildly elevated pulmonary  artery systolic  pressure. The estimated right ventricular systolic pressure is 123456 mmHg.  3. Left atrial size was severely dilated.  4. The mitral valve is grossly normal. Trivial mitral valve  regurgitation.  5. The aortic valve has been repaired/replaced. History of bioprosthetic  AVR with valve in valve TAVR, 26 mm Sapien 3 prosthesis. There is a  moderate perivalvular leak at 9 o'clock. Aortic valve regurgitation is  mild. Aortic valve mean gradient measures  13.7 mmHg.  6. The inferior vena cava is normal in size with greater than 50%  respiratory variability, suggesting right atrial pressure of 3 mmHg.   Reproductive/Obstetrics                             Anesthesia Physical Anesthesia Plan  ASA: III  Anesthesia Plan: General   Post-op Pain Management:    Induction: Intravenous  PONV Risk Score and Plan:   Airway Management Planned: Nasal Cannula, Natural Airway and Simple Face Mask  Additional Equipment:   Intra-op Plan:   Post-operative Plan:   Informed Consent: I have reviewed the patients History and Physical, chart, labs and discussed the procedure including the risks, benefits and alternatives for the proposed anesthesia with the patient or authorized representative who has indicated his/her understanding and acceptance.     Dental advisory given  Plan Discussed with: CRNA  Anesthesia Plan Comments:         Anesthesia Quick Evaluation

## 2020-02-02 NOTE — Progress Notes (Signed)
  Subjective:  Patient has no complaints.  He states he has been walking to the bathroom without getting short of breath.  He denies chest or abdominal pain.  No melena or rectal bleeding reported.  He is hungry.  Objective: Blood pressure (!) 148/70, pulse 68, temperature 97.8 F (36.6 C), temperature source Oral, resp. rate 19, height '5\' 4"'$  (1.626 m), weight 52.9 kg, SpO2 98 %. Patient is alert and in no acute distress. Cardiac exam with regular rhythm normal S1 and loud S2 with systolic as well as diastolic murmur best heard at aortic area and left upper sternal border. Auscultation lungs reveal vesicular breath sounds bilaterally. Abdominal exam reveals small subcutaneous supraumbilical nodule.  Abdomen is soft and nontender with organomegaly or masses. No peripheral edema or clubbing noted.  Labs/studies Results:  CBC Latest Ref Rng & Units 02/02/2020 02/01/2020 01/31/2020  WBC 4.0 - 10.5 K/uL 9.5 10.3 10.1  Hemoglobin 13.0 - 17.0 g/dL 8.7(L) 8.7(L) 6.2(LL)  Hematocrit 39.0 - 52.0 % 29.2(L) 29.1(L) 21.3(L)  Platelets 150 - 400 K/uL 357 354 334    CMP Latest Ref Rng & Units 02/02/2020 02/01/2020 01/31/2020  Glucose 70 - 99 mg/dL 92 99 89  BUN 8 - 23 mg/dL '11 14 14  '$ Creatinine 0.61 - 1.24 mg/dL 0.99 1.02 0.89  Sodium 135 - 145 mmol/L 132(L) 133(L) 128(L)  Potassium 3.5 - 5.1 mmol/L 3.9 3.9 3.8  Chloride 98 - 111 mmol/L 97(L) 96(L) 97(L)  CO2 22 - 32 mmol/L '26 26 23  '$ Calcium 8.9 - 10.3 mg/dL 8.8(L) 8.2(L) 8.1(L)  Total Protein 6.5 - 8.1 g/dL 6.8 - 6.6  Total Bilirubin 0.3 - 1.2 mg/dL 1.4(H) - 0.8  Alkaline Phos 38 - 126 U/L 138(H) - 129(H)  AST 15 - 41 U/L 19 - 18  ALT 0 - 44 U/L 15 - 16    Hepatic Function Latest Ref Rng & Units 02/02/2020 01/31/2020 11/17/2019  Total Protein 6.5 - 8.1 g/dL 6.8 6.6 7.1  Albumin 3.5 - 5.0 g/dL 3.3(L) 3.1(L) 3.8  AST 15 - 41 U/L '19 18 19  '$ ALT 0 - 44 U/L '15 16 12  '$ Alk Phosphatase 38 - 126 U/L 138(H) 129(H) 67  Total Bilirubin 0.3 - 1.2 mg/dL 1.4(H)  0.8 0.8  Bilirubin, Direct 0.0 - 0.3 mg/dL - - -    PSA 2.23.  BNP 1226.  It was 1406 yesterday.  Assessment:  #1.  Iron deficiency anemia.  No evidence of overt or occult GI bleed.  Patient has received 2 units of PRBCs.  Hemoglobin has remained stable in the last 24 hours.  #2.  Aortic valve disease.  Status post TAVR in January 2029.  #3.  History of CHF.  Patient has cardiomyopathy.  BNP remains elevated but trending downwards.  Clinically does not appear to be in CHF.  #4.  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  Outpatient work-up planned.  #5.  Mildly elevated alkaline phosphatase may be related to congestive hepatopathy.  Transaminases are normal.  He could also have bone disease.  Plan:  Proceed with diagnostic esophagogastroduodenoscopy and colonoscopy. Patient to receive 2 g of Ancef IV for SBE prophylaxis.

## 2020-02-02 NOTE — Op Note (Addendum)
[facility] Patient Name: [name] Procedure Date: 02/02/2020 1:07 PM MRN: [id number] Date of Birth: Sep 26, 1950 Attending MD: Hildred Laser , MD CSN: [id number] Age: 69 Admit Type: Inpatient Procedure:                Upper GI endoscopy Indications:              Unexplained iron deficiency anemia Providers:                Hildred Laser, MD, Lurline Del, RN, Nelma Rothman,                            Technician Referring MD:             Lanier Prude Amin, MD Medicines:                Propofol per Anesthesia Complications:            No immediate complications. Estimated Blood Loss:     Estimated blood loss: none. Procedure:                Pre-Anesthesia Assessment:                           - Prior to the procedure, a History and Physical                            was performed, and patient medications and                            allergies were reviewed. The patient's tolerance of                            previous anesthesia was also reviewed. The risks                            and benefits of the procedure and the sedation                            options and risks were discussed with the patient.                            All questions were answered, and informed consent                            was obtained. Prior Anticoagulants: The patient                            last took Plavix (clopidogrel) 3 days prior to the                            procedure. ASA Grade Assessment: III - A patient                            with severe systemic disease. After reviewing the  risks and benefits, the patient was deemed in                            satisfactory condition to undergo the procedure.                           After obtaining informed consent, the endoscope was                            passed under direct vision. Throughout the                            procedure, the patient's blood pressure, pulse, and   oxygen saturations were monitored continuously. The                            [id number]-[id number] scope was introduced through the                            mouth, and advanced to the second part of duodenum.                            The upper GI endoscopy was accomplished without                            difficulty. The patient tolerated the procedure                            well. Scope In: 1:20:41 PM Scope Out: 1:26:41 PM Total Procedure Duration: 0 hours 6 minutes 0 seconds  Findings:      The hypopharynx was normal.      The proximal esophagus and mid esophagus were normal.      LA Grade A (one or more mucosal breaks less than 5 mm, not extending       between tops of 2 mucosal folds) esophagitis with no bleeding was found       38 to 39 cm from the incisors.      The Z-line was regular and was found 39 cm from the incisors.      A 2 cm hiatal hernia was present.      The entire examined stomach was normal.      The duodenal bulb and second portion of the duodenum were normal. Impression:               - Normal hypopharynx.                           - Normal proximal esophagus and mid esophagus.                           - LA Grade A reflux esophagitis with no bleeding.                           - Z-line regular, 39 cm from the incisors.                           -  2 cm hiatal hernia.                           - Normal stomach.                           - Normal duodenal bulb and second portion of the                            duodenum.                           - No specimens collected. Moderate Sedation:      Per Anesthesia Care Recommendation:           - Return patient to hospital ward for ongoing care.                           - Cardiac diet today.                           - Continue present medications.                           - Continue Pantoprazole 40 mg po qam.                           - See the other procedure note for documentation of                             additional recommendations. Procedure Code(s):        --- Professional ---                           [id number], Esophagogastroduodenoscopy, flexible,                            transoral; diagnostic, including collection of                            specimen(s) by brushing or washing, when performed                            (separate procedure) Diagnosis Code(s):        --- Professional ---                           K21.00, Gastro-esophageal reflux disease with                            esophagitis, without bleeding                           K44.9, Diaphragmatic hernia without obstruction or                            gangrene  D50.9, Iron deficiency anemia, unspecified CPT copyright 2019 American [employer] Association. All rights reserved. The codes documented in this report are preliminary and upon coder review may  be revised to meet current compliance requirements. Hildred Laser, MD Hildred Laser, MD 02/02/2020 2:07:36 PM This report has been signed electronically. Number of Addenda: 0

## 2020-02-02 NOTE — Progress Notes (Addendum)
PROGRESS NOTE    [name]  [id number] DOB: 1951/02/17 DOA: 01/31/2020 PCP: [name], MD   Brief Narrative:  69 year old with history of PAD, arterial embolus to lower extremity, CAD, HTN, chronic anemia, TAVR on aspirin Plavix admitted for progressive shortness of breath found to have hemoglobin of 6.2. Hemoccult negative. Mild hyponatremia and elevated BNP.  GI team consulted.  Assessment & Plan:   Principal Problem:   Symptomatic anemia--acute on chronic secondary to acute blood loss Active Problems:   Essential hypertension, benign   S/P aortic valve replacement   Coronary atherosclerosis of native coronary artery   Peripheral arterial disease (HCC)   Claudication in peripheral vascular disease (HCC)   Acute GI bleeding   Acute on chronic HFrEF (heart failure with reduced ejection fraction) -combined systolic and diastolic dysfunction CHF   GI bleed  Acute on chronic iron deficiency anemia, microcytic Severe iron deficiency anemia.. Plans for EGD and colonoscopy today Received IV iron yesterday.  Start p.o. iron with bowel supplements after his colonoscopy Status post 2 units PRBC transfusion Protonix IV twice daily  Addendum 230pm  Spoke with Dr Laural Golden. Plans for outpatient Video Capsule. In the meantime daily PPI. Hold off on Iron supplements.   Will also get CT Chest/ Abd Pelvis for malignancy work up  Acute hypoxic respiratory failure secondary to anemia and elevated BNP. Congestive heart failure with reduced ejection fraction, EF 45%. Class III Repeat chest x-ray today.  We will give him another day of Lasix p.o. twice daily.. Continue Toprol-XL, lisinopril.  PAD/ H /o CAD--prior history of bare-metal stent to mid RCA aortic valve repair/TAVR as well as stent graft in ascending thoracic aorta Antiplatelets currently on hold due to bleeding. Resume once cleared by GI.  Hyperlipidemia Statin.  History of depression Continue home meds when  able to take p.o.  Left neck incisional biopsy back in March showing metastatic carcinoma Spoke with patient's primary care provider, Cherly Beach.  She will make outpatient follow-up arrangements   DVT prophylaxis: SCDs Code Status: Full code Family Communication:    Status is: Inpatient  Remains inpatient appropriate because:Hemodynamically unstable   Dispo: The patient is from: Home              Anticipated d/c is to: Home              Anticipated d/c date is: 2 days              Patient currently is not medically stable to d/c.  Plans for EGD and colonoscopy today.  Discharge disposition depending on his endoscopic evaluation.    Subjective: This morning his breathing has greatly improved after diuresis and receiving transfusion.  Review of Systems Otherwise negative except as per HPI, including: General: Denies fever, chills, night sweats or unintended weight loss. Resp: Denies cough, wheezing, shortness of breath. Cardiac: Denies chest pain, palpitations, orthopnea, paroxysmal nocturnal dyspnea. GI: Denies abdominal pain, nausea, vomiting, diarrhea or constipation GU: Denies dysuria, frequency, hesitancy or incontinence MS: Denies muscle aches, joint pain or swelling Neuro: Denies headache, neurologic deficits (focal weakness, numbness, tingling), abnormal gait Psych: Denies anxiety, depression, SI/HI/AVH Skin: Denies new rashes or lesions ID: Denies sick contacts, exotic exposures, travel  Examination:  Constitutional: Not in acute distress Respiratory: Clear to auscultation bilaterally Cardiovascular: Normal sinus rhythm, no rubs Abdomen: Nontender nondistended good bowel sounds Musculoskeletal: No edema noted Skin: No rashes seen Neurologic: CN 2-12 grossly intact.  And nonfocal Psychiatric: Normal judgment and insight. Alert  and oriented x 3. Normal mood.  Objective: Vitals:   02/01/20 0554 02/01/20 1935 02/01/20 2045 02/02/20 0433  BP: 118/64  122/71 (!)  114/59  Pulse: 66  70 65  Resp: 16  16 16   Temp: 98.5 F (36.9 C)  98.4 F (36.9 C) 98 F (36.7 C)  TempSrc: Oral  Oral Oral  SpO2: 100% 98% 100% 96%  Weight:      Height:        Intake/Output Summary (Last 24 hours) at 02/02/2020 1140 Last data filed at 02/02/2020 0800 Gross per 24 hour  Intake 240 ml  Output 1100 ml  Net -860 ml   Filed Weights   01/31/20 1107 01/31/20 2100 02/01/20 0200  Weight: 53.5 kg 52.8 kg 52.9 kg     Data Reviewed:   CBC: Recent Labs  Lab 01/31/20 1149 02/01/20 0609 02/02/20 0618  WBC 10.1 10.3 9.5  NEUTROABS 7.6  --   --   HGB 6.2* 8.7* 8.7*  HCT 21.3* 29.1* 29.2*  MCV 70.1* 72.6* 73.0*  PLT 334 354 [id number]   Basic Metabolic Panel: Recent Labs  Lab 01/31/20 1149 02/01/20 0609 02/02/20 0618  NA 128* 133* 132*  K 3.8 3.9 3.9  CL 97* 96* 97*  CO2 23 26 26   GLUCOSE 89 99 92  BUN 14 14 11   CREATININE 0.89 1.02 0.99  CALCIUM 8.1* 8.2* 8.8*  MG  --   --  2.1   GFR: Estimated Creatinine Clearance: 53.4 mL/min (by C-G formula based on SCr of 0.99 mg/dL). Liver Function Tests: Recent Labs  Lab 01/31/20 1149 02/02/20 0618  AST 18 19  ALT 16 15  ALKPHOS 129* 138*  BILITOT 0.8 1.4*  PROT 6.6 6.8  ALBUMIN 3.1* 3.3*   No results for input(s): LIPASE, AMYLASE in the last 168 hours. No results for input(s): AMMONIA in the last 168 hours. Coagulation Profile: Recent Labs  Lab 01/31/20 1254  INR 1.2   Cardiac Enzymes: No results for input(s): CKTOTAL, CKMB, CKMBINDEX, TROPONINI in the last 168 hours. BNP (last 3 results) No results for input(s): PROBNP in the last 8760 hours. HbA1C: No results for input(s): HGBA1C in the last 72 hours. CBG: No results for input(s): GLUCAP in the last 168 hours. Lipid Profile: No results for input(s): CHOL, HDL, LDLCALC, TRIG, CHOLHDL, LDLDIRECT in the last 72 hours. Thyroid Function Tests: No results for input(s): TSH, T4TOTAL, FREET4, T3FREE, THYROIDAB in the last 72 hours. Anemia  Panel: Recent Labs    01/31/20 1254  VITAMINB12 243  FOLATE 11.8  FERRITIN 5*  TIBC 332  IRON 9*  RETICCTPCT 1.8   Sepsis Labs: Recent Labs  Lab 02/02/20 0618  PROCALCITON <0.10    Recent Results (from the past 240 hour(s))  SARS Coronavirus 2 by RT PCR (hospital order, performed in [facility] hospital lab) Nasopharyngeal Nasopharyngeal Swab     Status: None   Collection Time: 01/31/20 11:24 AM   Specimen: Nasopharyngeal Swab  Result Value Ref Range Status   SARS Coronavirus 2 NEGATIVE NEGATIVE Final    Comment: (NOTE) SARS-CoV-2 target nucleic acids are NOT DETECTED. The SARS-CoV-2 RNA is generally detectable in upper and lower respiratory specimens during the acute phase of infection. The lowest concentration of SARS-CoV-2 viral copies this assay can detect is 250 copies / mL. A negative result does not preclude SARS-CoV-2 infection and should not be used as the sole basis for treatment or other patient management decisions.  A negative result may occur with improper  specimen collection / handling, submission of specimen other than nasopharyngeal swab, presence of viral mutation(s) within the areas targeted by this assay, and inadequate number of viral copies (<250 copies / mL). A negative result must be combined with clinical observations, patient history, and epidemiological information. Fact Sheet for Patients:   StrictlyIdeas.no Fact Sheet for Healthcare Providers: BankingDealers.co.za This test is not yet approved or cleared  by the [place] FDA and has been authorized for detection and/or diagnosis of SARS-CoV-2 by FDA under an Emergency Use Authorization (EUA).  This EUA will remain in effect (meaning this test can be used) for the duration of the COVID-19 declaration under Section 564(b)(1) of the Act, 21 U.S.C. section 360bbb-3(b)(1), unless the authorization is terminated or revoked sooner. Performed at  [facility], [address], [place], [place] [address]          Radiology Studies: [facility] Chest [facility] 1 View  Result Date: 02/01/2020 CLINICAL DATA:  Dyspnea EXAM: PORTABLE CHEST 1 VIEW COMPARISON:  Yesterday FINDINGS: Transcatheter aortic valve replacement. Cardiomegaly is unchanged. Aortic tortuosity. There is no edema, consolidation, effusion, or pneumothorax. Nipple shadow noted on the left. Embolization coils over the right upper quadrant. IMPRESSION: Stable exam.  No evidence of acute disease. Electronically Signed   By: Monte Fantasia M.D.   On: 02/01/2020 09:40        Scheduled Meds: . atorvastatin  40 mg Oral Daily  . busPIRone  10 mg Oral BID  . cholecalciferol  1,000 Units Oral Daily  . escitalopram  10 mg Oral Daily  . furosemide  40 mg Oral BID  . lisinopril  20 mg Oral Daily  . metoprolol succinate  50 mg Oral Daily  . montelukast  10 mg Oral QHS  . pantoprazole (PROTONIX) IV  40 mg Intravenous Q12H  . sodium chloride flush  3 mL Intravenous Q12H   Continuous Infusions: . sodium chloride    .  ceFAZolin (ANCEF) IV       LOS: 1 day   Time spent= 35 mins    Aurea Aronov Arsenio Loader, MD [facility]  If 7PM-7AM, please contact night-coverage  02/02/2020, 11:40 AM

## 2020-02-02 NOTE — Progress Notes (Signed)
Brief EGD and colonoscopy note  Grade a reflux esophagitis. Small sliding hiatal hernia.  Small polyp cold snare from splenic flexure. Mild sigmoid colon diverticulosis. Small external hemorrhoids.

## 2020-02-02 NOTE — Anesthesia Postprocedure Evaluation (Signed)
Anesthesia Post Note  Patient: [name]  Procedure(s) Performed: ESOPHAGOGASTRODUODENOSCOPY (EGD) WITH PROPOFOL (N/A ) COLONOSCOPY WITH PROPOFOL (N/A ) POLYPECTOMY  Patient location during evaluation: PACU Anesthesia Type: General Level of consciousness: awake, oriented, awake and alert and patient cooperative Pain management: pain level controlled Vital Signs Assessment: post-procedure vital signs reviewed and stable Respiratory status: spontaneous breathing, respiratory function stable and nonlabored ventilation Cardiovascular status: stable Postop Assessment: no apparent nausea or vomiting Anesthetic complications: no     Last Vitals:  Vitals:   02/02/20 0433 02/02/20 1158  BP: (!) 114/59 (!) 148/70  Pulse: 65 68  Resp: 16 19  Temp: 36.7 C 36.6 C  SpO2: 96% 98%    Last Pain:  Vitals:   02/02/20 1158  TempSrc: Oral  PainSc: 0-No pain                 Adara Kittle

## 2020-02-03 LAB — COMPREHENSIVE METABOLIC PANEL
ALT: 13 U/L (ref 0–44)
AST: 15 U/L (ref 15–41)
Albumin: 2.9 g/dL — ABNORMAL LOW (ref 3.5–5.0)
Alkaline Phosphatase: 121 U/L (ref 38–126)
Anion gap: 10 (ref 5–15)
BUN: 9 mg/dL (ref 8–23)
CO2: 24 mmol/L (ref 22–32)
Calcium: 8.3 mg/dL — ABNORMAL LOW (ref 8.9–10.3)
Chloride: 96 mmol/L — ABNORMAL LOW (ref 98–111)
Creatinine, Ser: 0.82 mg/dL (ref 0.61–1.24)
GFR calc Af Amer: >60 mL/min (ref >60)
GFR calc non Af Amer: >60 mL/min (ref >60)
Glucose, Bld: 85 mg/dL (ref 70–99)
Potassium: 3.5 mmol/L (ref 3.5–5.1)
Sodium: 130 mmol/L — ABNORMAL LOW (ref 135–145)
Total Bilirubin: 1.1 mg/dL (ref 0.3–1.2)
Total Protein: 6 g/dL — ABNORMAL LOW (ref 6.5–8.1)

## 2020-02-03 LAB — MAGNESIUM: Magnesium: 1.9 mg/dL (ref 1.7–2.4)

## 2020-02-03 MED ORDER — ACETAMINOPHEN 325 MG PO TABS: 650.0000 mg | ORAL_TABLET | Freq: Four times a day (QID) | ORAL | Status: DC | PRN

## 2020-02-03 MED ORDER — FENTANYL CITRATE (PF) 100 MCG/2ML IJ SOLN
25.0000 ug | Freq: Once | INTRAMUSCULAR | Status: AC | PRN
  Administered 2020-02-03: 25 ug via INTRAVENOUS
  Filled 2020-02-03: qty 2

## 2020-02-03 MED ORDER — PANTOPRAZOLE SODIUM 40 MG PO TBEC: 40.0000 mg | DELAYED_RELEASE_TABLET | Freq: Every day | ORAL | 2 refills | Status: DC

## 2020-02-03 MED ORDER — ACETAMINOPHEN 325 MG PO TABS
650.0000 mg | ORAL_TABLET | Freq: Once | ORAL | Status: DC
  Filled 2020-02-03: qty 2

## 2020-02-03 NOTE — Progress Notes (Signed)
Nsg Discharge Note  Admit Date:  01/31/2020 Discharge date: 02/03/2020   Kathrene Bongo to be D/C'd Home per MD order.  AVS completed.  Patient able to verbalize understanding.  Discharge Medication: Allergies as of 02/03/2020   No Known Allergies     Medication List    TAKE these medications   aspirin EC 81 MG tablet Take 1 tablet (81 mg total) by mouth daily.   atorvastatin 40 MG tablet Commonly known as: LIPITOR Take 1 tablet by mouth once daily   busPIRone 10 MG tablet Commonly known as: BUSPAR Take 1 tablet (10 mg total) by mouth 2 (two) times daily.   cholecalciferol 25 MCG (1000 UNIT) tablet Commonly known as: VITAMIN D Take 1 tablet (1,000 Units total) by mouth daily.   clopidogrel 75 MG tablet Commonly known as: PLAVIX Take 1 tablet by mouth once daily   escitalopram 10 MG tablet Commonly known as: LEXAPRO Take 1 tablet (10 mg total) by mouth daily.   fluticasone 50 MCG/ACT nasal spray Commonly known as: FLONASE Place 2 sprays into both nostrils daily. What changed:   when to take this  reasons to take this   furosemide 40 MG tablet Commonly known as: LASIX Take 1 tablet by mouth once daily   lisinopril 20 MG tablet Commonly known as: ZESTRIL Take 1 tablet by mouth once daily   metoprolol succinate 50 MG 24 hr tablet Commonly known as: TOPROL-XL TAKE 1 TABLET BY MOUTH ONCE DAILY WITH  OR  IMMEDIATELY  FOLLOWING  A  MEAL   montelukast 10 MG tablet Commonly known as: SINGULAIR TAKE 1 TABLET BY MOUTH AT BEDTIME   nitroGLYCERIN 0.4 MG SL tablet Commonly known as: NITROSTAT Place 1 tablet (0.4 mg total) under the tongue every 5 (five) minutes as needed for chest pain.   pantoprazole 40 MG tablet Commonly known as: PROTONIX Take 1 tablet (40 mg total) by mouth daily before breakfast.   trolamine salicylate 10 % cream Commonly known as: ASPERCREME Apply 1 application topically as needed for muscle pain.       Discharge  Assessment: Vitals:   02/02/20 2140 02/03/20 0637  BP: (!) 117/53 (!) 163/70  Pulse: 74 91  Resp: 16 18  Temp: 98.2 F (36.8 C) 98.1 F (36.7 C)  SpO2: 97% 96%   Skin clean, dry and intact without evidence of skin break down, no evidence of skin tears noted. IV catheter discontinued intact. Site without signs and symptoms of complications - no redness or edema noted at insertion site, patient denies c/o pain - only slight tenderness at site.  Dressing with slight pressure applied.  D/c Instructions-Education: Discharge instructions given to patient with verbalized understanding. D/c education completed with patient including follow up instructions, medication list, d/c activities limitations if indicated, with other d/c instructions as indicated by MD - patient able to verbalize understanding, all questions fully answered. Patient instructed to return to ED, call 911, or call MD for any changes in condition.  Patient escorted via [place], and D/C home via private auto.  Foster Frericks Loletha Grayer, RN 02/03/2020 10:27 AM

## 2020-02-03 NOTE — Discharge Summary (Signed)
Physician Discharge Summary  KAVARION KLEINKE [id number] DOB: [birth year] DOA: 01/31/2020  PCP: [name], MD  Admit date: 01/31/2020 Discharge date: 02/03/2020  Admitted From: Home Disposition: Home  Recommendations for Outpatient Follow-up:  1. Follow up with PCP in 1-2 weeks 2. Please obtain BMP/CBC in one week your next doctors visit.  3. Protonix p.o. daily 4. Follow-up outpatient GI early next week for capsule endoscopy.  Hold off on iron supplements until after this procedure.  GI.  GI will also follow-up with biopsy results from EGD/colonoscopy procedure in the hospital. 5. Follow-up outpatient primary care physician who will also make arrangements for follow-up from recent findings back in March of metastatic adenocarcinoma in his lymph node biopsy. 6. Outpatient follow-up with urology.  Discharge Condition: Stable CODE STATUS: Full Diet recommendation: Heart healthy  Brief/Interim Summary: 69 year old with history of PAD, arterial embolus to lower extremity, CAD, HTN, chronic anemia, TAVR on aspirin Plavix admitted for progressive shortness of breath found to have hemoglobin of 6.2. Hemoccult negative. Mild hyponatremia and elevated BNP.  GI team consulted.  Colonoscopy on 5/15 showed diverticulosis, external hemorrhoids.  Endoscopy showed grade a esophagitis with hiatal hernia.  Recommended PPI daily but hold off on iron supplements until outpatient capsule study has been performed early next week. Follow-up outpatient PCP and oncology  Symptomatically doing much better stable to be discharged.  Assessment & Plan:   Principal Problem:   Symptomatic anemia--acute on chronic secondary to acute blood loss Active Problems:   Essential hypertension, benign   S/P aortic valve replacement   Coronary atherosclerosis of native coronary artery   Peripheral arterial disease (HCC)   Claudication in peripheral vascular disease (HCC)   Acute GI bleeding   Acute on  chronic HFrEF (heart failure with reduced ejection fraction) -combined systolic and diastolic dysfunction CHF   GI bleed  Acute on chronic iron deficiency anemia, microcytic Severe iron deficiency anemia.. Status post endoscopy/colonoscopy 5/15-showed diverticulosis, external hemorrhoid, esophagitis. Daily PPI. Iron supplements on hold until outpatient capsule study early next week.  He is status post 1 dose of IV iron in the hospital Hemoglobin is stable.  Acute hypoxic respiratory failure secondary to anemia and elevated BNP. Congestive heart failure with reduced ejection fraction, EF 45%. Class II Symptomatically doing much better.  Back to baseline.  Resume his home medication Toprol-XL, lisinopril and Lasix  PAD/ H /o CAD--prior history of bare-metal stent to mid RCAaortic valve repair/TAVRas well as stent graft in ascending thoracic aorta Resume his antiplatelet therapy.  Hyperlipidemia Statin.  History of depression Continue home meds when able to take p.o.  Left neck incisional biopsy back in March showing metastatic carcinoma Spoke with patient's primary care provider, Cherly Beach.  She will make outpatient follow-up arrangements CT with contrast in the hospital-showed progression of bladder neoplasm, retroperitoneal/pelvic adenopathy, metastatic bone disease, bilateral hydronephrosis which has progressed, ascites. Will need to make outpatient follow-up arrangement with urology within the next week.  Spoke with Dr Alinda Money and the patient regarding these findings who recommended outpatient follow-up in upcoming week.  I have messaged patient's primary care provider regarding this to help coordinate this and to ensure patient follows up.   Discharge Diagnoses:  Principal Problem:   Symptomatic anemia--acute on chronic secondary to acute blood loss Active Problems:   Essential hypertension, benign   S/P aortic valve replacement   Coronary atherosclerosis of native  coronary artery   Peripheral arterial disease (HCC)   Claudication in peripheral vascular disease ([place])  Acute GI bleeding   Acute on chronic HFrEF (heart failure with reduced ejection fraction) -combined systolic and diastolic dysfunction CHF   GI bleed    Consultations:  Gastroenterology  Curbside urology, Dr. Alinda Money  Subjective: Patient feels okay no complaints wishes to go home today.  Discharge Exam: Vitals:   02/02/20 2140 02/03/20 0637  BP: (!) 117/53 (!) 163/70  Pulse: 74 91  Resp: 16 18  Temp: 98.2 F (36.8 C) 98.1 F (36.7 C)  SpO2: 97% 96%   Vitals:   02/02/20 1430 02/02/20 1934 02/02/20 2140 02/03/20 0637  BP: 139/72  (!) 117/53 (!) 163/70  Pulse:   74 91  Resp: 13  16 18   Temp: 97.7 F (36.5 C)  98.2 F (36.8 C) 98.1 F (36.7 C)  TempSrc:   Oral Oral  SpO2: 98% 98% 97% 96%  Weight:      Height:        General: Pt is alert, awake, not in acute distress Cardiovascular: RRR, S1/S2 +, no rubs, no gallops Respiratory: CTA bilaterally, no wheezing, no rhonchi Abdominal: Soft, NT, ND, bowel sounds + Extremities: no edema, no cyanosis  Discharge Instructions  Discharge Instructions    Discharge patient   Complete by: As directed    Discharge disposition: 01-Home or Self Care   Discharge patient date: 02/03/2020     Allergies as of 02/03/2020   No Known Allergies     Medication List    TAKE these medications   aspirin EC 81 MG tablet Take 1 tablet (81 mg total) by mouth daily.   atorvastatin 40 MG tablet Commonly known as: LIPITOR Take 1 tablet by mouth once daily   busPIRone 10 MG tablet Commonly known as: BUSPAR Take 1 tablet (10 mg total) by mouth 2 (two) times daily.   cholecalciferol 25 MCG (1000 UNIT) tablet Commonly known as: VITAMIN D Take 1 tablet (1,000 Units total) by mouth daily.   clopidogrel 75 MG tablet Commonly known as: PLAVIX Take 1 tablet by mouth once daily   escitalopram 10 MG tablet Commonly known as:  LEXAPRO Take 1 tablet (10 mg total) by mouth daily.   fluticasone 50 MCG/ACT nasal spray Commonly known as: FLONASE Place 2 sprays into both nostrils daily. What changed:   when to take this  reasons to take this   furosemide 40 MG tablet Commonly known as: LASIX Take 1 tablet by mouth once daily   lisinopril 20 MG tablet Commonly known as: ZESTRIL Take 1 tablet by mouth once daily   metoprolol succinate 50 MG 24 hr tablet Commonly known as: TOPROL-XL TAKE 1 TABLET BY MOUTH ONCE DAILY WITH  OR  IMMEDIATELY  FOLLOWING  A  MEAL   montelukast 10 MG tablet Commonly known as: SINGULAIR TAKE 1 TABLET BY MOUTH AT BEDTIME   nitroGLYCERIN 0.4 MG SL tablet Commonly known as: NITROSTAT Place 1 tablet (0.4 mg total) under the tongue every 5 (five) minutes as needed for chest pain.   pantoprazole 40 MG tablet Commonly known as: PROTONIX Take 1 tablet (40 mg total) by mouth daily before breakfast.   trolamine salicylate 10 % cream Commonly known as: ASPERCREME Apply 1 application topically as needed for muscle pain.      Follow-up Information    [name], MD. Schedule an appointment as soon as possible for a visit in 1 week(s).   Specialty: Family Medicine Contact information: [address] [place] [address] [phone]  Satira Sark, MD .   Specialty: Cardiology Contact information: [place] [address] [phone]        Rogene Houston, MD Follow up.   Specialty: Gastroenterology Why: Their office to arrange for it Contact information: [place] [address] [phone]          No Known Allergies  You were cared for by a hospitalist during your hospital stay. If you have any questions about your discharge medications or the care you received while you were in the hospital after you are discharged, you can call the unit and asked to speak with the hospitalist on call if  the hospitalist that took care of you is not available. Once you are discharged, your primary care physician will handle any further medical issues. Please note that no refills for any discharge medications will be authorized once you are discharged, as it is imperative that you return to your primary care physician (or establish a relationship with a primary care physician if you do not have one) for your aftercare needs so that they can reassess your need for medications and monitor your lab values.   Procedures/Studies: CT CHEST W CONTRAST  Result Date: 02/02/2020 CLINICAL DATA:  Abdominal pain fever postop shortness of breath low hemoglobin EXAM: CT CHEST, ABDOMEN, AND PELVIS WITH CONTRAST TECHNIQUE: Multidetector CT imaging of the chest, abdomen and pelvis was performed following the standard protocol during bolus administration of intravenous contrast. CONTRAST:  136mL OMNIPAQUE IOHEXOL 300 MG/ML  SOLN COMPARISON:  Chest x-ray 02/01/2020, PET CT 10/30/2019, CT 09/19/2019, 02/09/2011 FINDINGS: CT CHEST FINDINGS Cardiovascular: Prior stent graft repair of the ascending aorta, aortic valve and aortic root. Moderate aortic atherosclerosis. Proximal arch diameter of 3.7 cm as compared with 4 cm previously. Coronary vascular calcifications. Cardiomegaly. No pericardial effusion. Mediastinum/Nodes: Midline trachea. No thyroid mass. Incompletely visualized left supraclavicular nodes. No significant axillary, mediastinal or hilar adenopathy. Esophagus within normal limits. Lungs/Pleura: Blebs at the right apex. Calcified right apical lung nodule/granuloma. No pleural effusion or pneumothorax. Subsegmental atelectasis at the right base. Ovoid area of consolidation in the posterior right lower lobe measuring 15 mm. Mild right upper lobe pleural nodularity, not clearly seen on previous exam. Musculoskeletal: Post sternotomy changes. Sclerotic foci now evident within the right sixth rib posteriorly, eighth posterior  rib, and seventh lateral rib. Sclerotic focus within the left sixth and eighth lateral ribs. Sclerosis within the posterior T7 vertebral body. CT ABDOMEN PELVIS FINDINGS Hepatobiliary: No focal hepatic abnormality. No biliary dilatation. No calcified gallstone. Coils at the porta hepatis. Pancreas: Unremarkable. No pancreatic ductal dilatation or surrounding inflammatory changes. Spleen: Lobulated morphology with multiple splenules. Probable dystrophic calcification or granuloma. Adrenals/Urinary Tract: Adrenal glands are within normal limits. Cortical scarring within the mid pole of the right kidney and mid to upper pole left kidney. Exophytic cyst upper pole right kidney. 2.7 cm intermediate exophytic density off the upper pole right kidney noted to be PET negative and presumably representing hemorrhagic or proteinaceous cyst. Diffuse irregular bladder wall thickening with right-sided predominance. This is increased compared to prior. Dominant masslike region of thickening involving the right bladder measures approximately 7 x 4.3 cm, series 2, image number 95. Multiple nodules or irregular foci of bladder wall thickening posteriorly. Bilateral hydronephrosis and hydroureter right greater than left and increased in the interval. No ureteral stones. Stomach/Bowel: Stomach is nonenlarged. No dilated small bowel. No bowel wall thickening. Vascular/Lymphatic: Extensive aortic atherosclerosis with aortic tortuosity. No  aneurysm. Increased abdominopelvic lymphadenopathy. Right retrocrural node measures 9 mm compared with 6 mm previously, series 2, image number 54. Aortocaval lymph node measures 9 mm compared with 8 mm previously, series 2, image number 69. Left para aortic lymph node measures 10 mm compared with 7 mm previously, series 2, image number 69. Additional left para aortic lymph node measuring 13 mm, series 2, image number 63. Left pelvic sidewall lymph node measures 14 mm, compared with 7 mm previously,  series 2, image number 98. Ext right external iliac node measures 15 mm compared with 12 mm previously, series 2, image number 97. Right common iliac node measuring 24 mm, series 2, image number 91. Reproductive: Prostate is enlarged. Other: Negative for free air. Mild perirectal soft tissue stranding. Diffuse subcutaneous edema. Trace free fluid in the abdomen and pelvis. No free air Musculoskeletal: Intramuscular lipoma within the inferior right gluteus musculature. New faint foci of sclerosis within the left sacrum, series 2, image number 91 and the left iliac bone, series 2, image number 87. Grade 1 anterolisthesis L4 on L5 with endplate degenerative changes. IMPRESSION: 1. Progression of irregular bladder wall thickening with right-sided predominance suspicious for primary bladder neoplasm. 2. Increased retroperitoneal and pelvic adenopathy, concerning for progression of metastatic disease. 3. Interim development of multiple faint foci of sclerosis involving the ribs, spine and pelvic bones, consistent with skeletal metastatic disease. 4. Progression of bilateral hydronephrosis and hydroureter, presumably obstructed from bladder mass. 5. 2.7 cm intermediate density exophytic to the upper pole of right kidney seen on prior PET-CT and noted to be PET negative at that time and felt to represent hemorrhagic or proteinaceous cyst 6. Small amount of ascites.  Mild diffuse subcutaneous edema 7. 12 mm ovoid area of consolidation in the posterior right lower lobe, indeterminate for a pulmonary nodule or nonspecific area of consolidation. Electronically Signed   By: Donavan Foil M.D.   On: 02/02/2020 20:37   CT ABDOMEN PELVIS W CONTRAST  Result Date: 02/02/2020 CLINICAL DATA:  Abdominal pain fever postop shortness of breath low hemoglobin EXAM: CT CHEST, ABDOMEN, AND PELVIS WITH CONTRAST TECHNIQUE: Multidetector CT imaging of the chest, abdomen and pelvis was performed following the standard protocol during bolus  administration of intravenous contrast. CONTRAST:  147mL OMNIPAQUE IOHEXOL 300 MG/ML  SOLN COMPARISON:  Chest x-ray 02/01/2020, PET CT 10/30/2019, CT 09/19/2019, 02/09/2011 FINDINGS: CT CHEST FINDINGS Cardiovascular: Prior stent graft repair of the ascending aorta, aortic valve and aortic root. Moderate aortic atherosclerosis. Proximal arch diameter of 3.7 cm as compared with 4 cm previously. Coronary vascular calcifications. Cardiomegaly. No pericardial effusion. Mediastinum/Nodes: Midline trachea. No thyroid mass. Incompletely visualized left supraclavicular nodes. No significant axillary, mediastinal or hilar adenopathy. Esophagus within normal limits. Lungs/Pleura: Blebs at the right apex. Calcified right apical lung nodule/granuloma. No pleural effusion or pneumothorax. Subsegmental atelectasis at the right base. Ovoid area of consolidation in the posterior right lower lobe measuring 15 mm. Mild right upper lobe pleural nodularity, not clearly seen on previous exam. Musculoskeletal: Post sternotomy changes. Sclerotic foci now evident within the right sixth rib posteriorly, eighth posterior rib, and seventh lateral rib. Sclerotic focus within the left sixth and eighth lateral ribs. Sclerosis within the posterior T7 vertebral body. CT ABDOMEN PELVIS FINDINGS Hepatobiliary: No focal hepatic abnormality. No biliary dilatation. No calcified gallstone. Coils at the porta hepatis. Pancreas: Unremarkable. No pancreatic ductal dilatation or surrounding inflammatory changes. Spleen: Lobulated morphology with multiple splenules. Probable dystrophic calcification or granuloma. Adrenals/Urinary Tract: Adrenal glands are within normal limits.  Cortical scarring within the mid pole of the right kidney and mid to upper pole left kidney. Exophytic cyst upper pole right kidney. 2.7 cm intermediate exophytic density off the upper pole right kidney noted to be PET negative and presumably representing hemorrhagic or proteinaceous  cyst. Diffuse irregular bladder wall thickening with right-sided predominance. This is increased compared to prior. Dominant masslike region of thickening involving the right bladder measures approximately 7 x 4.3 cm, series 2, image number 95. Multiple nodules or irregular foci of bladder wall thickening posteriorly. Bilateral hydronephrosis and hydroureter right greater than left and increased in the interval. No ureteral stones. Stomach/Bowel: Stomach is nonenlarged. No dilated small bowel. No bowel wall thickening. Vascular/Lymphatic: Extensive aortic atherosclerosis with aortic tortuosity. No aneurysm. Increased abdominopelvic lymphadenopathy. Right retrocrural node measures 9 mm compared with 6 mm previously, series 2, image number 54. Aortocaval lymph node measures 9 mm compared with 8 mm previously, series 2, image number 69. Left para aortic lymph node measures 10 mm compared with 7 mm previously, series 2, image number 69. Additional left para aortic lymph node measuring 13 mm, series 2, image number 63. Left pelvic sidewall lymph node measures 14 mm, compared with 7 mm previously, series 2, image number 98. Ext right external iliac node measures 15 mm compared with 12 mm previously, series 2, image number 97. Right common iliac node measuring 24 mm, series 2, image number 91. Reproductive: Prostate is enlarged. Other: Negative for free air. Mild perirectal soft tissue stranding. Diffuse subcutaneous edema. Trace free fluid in the abdomen and pelvis. No free air Musculoskeletal: Intramuscular lipoma within the inferior right gluteus musculature. New faint foci of sclerosis within the left sacrum, series 2, image number 91 and the left iliac bone, series 2, image number 87. Grade 1 anterolisthesis L4 on L5 with endplate degenerative changes. IMPRESSION: 1. Progression of irregular bladder wall thickening with right-sided predominance suspicious for primary bladder neoplasm. 2. Increased retroperitoneal and  pelvic adenopathy, concerning for progression of metastatic disease. 3. Interim development of multiple faint foci of sclerosis involving the ribs, spine and pelvic bones, consistent with skeletal metastatic disease. 4. Progression of bilateral hydronephrosis and hydroureter, presumably obstructed from bladder mass. 5. 2.7 cm intermediate density exophytic to the upper pole of right kidney seen on prior PET-CT and noted to be PET negative at that time and felt to represent hemorrhagic or proteinaceous cyst 6. Small amount of ascites.  Mild diffuse subcutaneous edema 7. 12 mm ovoid area of consolidation in the posterior right lower lobe, indeterminate for a pulmonary nodule or nonspecific area of consolidation. Electronically Signed   By: Donavan Foil M.D.   On: 02/02/2020 20:37   DG Chest Port 1 View  Result Date: 02/01/2020 CLINICAL DATA:  Dyspnea EXAM: PORTABLE CHEST 1 VIEW COMPARISON:  Yesterday FINDINGS: Transcatheter aortic valve replacement. Cardiomegaly is unchanged. Aortic tortuosity. There is no edema, consolidation, effusion, or pneumothorax. Nipple shadow noted on the left. Embolization coils over the right upper quadrant. IMPRESSION: Stable exam.  No evidence of acute disease. Electronically Signed   By: Monte Fantasia M.D.   On: 02/01/2020 09:40   DG Chest Portable 1 View  Result Date: 01/31/2020 CLINICAL DATA:  Shortness of breath. EXAM: PORTABLE CHEST 1 VIEW COMPARISON:  August 09, 2013. FINDINGS: Stable cardiomegaly. Status post aortic valve repair as well as probable stent graft in ascending thoracic aorta. No pneumothorax or pleural effusion is noted. Lungs are clear. Bony thorax is unremarkable. IMPRESSION: No acute cardiopulmonary abnormality seen. Electronically  Signed   By: Marijo Conception M.D.   On: 01/31/2020 11:45   ECHOCARDIOGRAM COMPLETE  Result Date: 01/05/2020    ECHOCARDIOGRAM REPORT   Patient Name:   MILANO RENZ Date of Exam: 01/05/2020 Medical Rec #:  [id number]         Height:       64.0 in Accession #:    [id number]       Weight:       115.0 lb Date of Birth:  05-08-1951        BSA:          1.546 m Patient Age:    11 years         BP:           150/52 mmHg Patient Gender: M                HR:           63 bpm. Exam Location:  [facility] Procedure: 2D Echo Indications:    Aortic Valve Disorder 424.1 / I35.9  History:        Patient has prior history of Echocardiogram examinations, most                 recent 12/29/2018. Risk Factors:Hypertension, Dyslipidemia and                 Former Smoker. S/P aortic valve replacement.  Sonographer:    Leavy Cella RDCS (AE) Referring Phys: [place]  1. Left ventricular ejection fraction, by estimation, is 45 to 50%. The left ventricle has mildly decreased function. The left ventricle demonstrates regional wall motion abnormalities (see scoring diagram/findings for description). The left ventricular  internal cavity size was mildly dilated. There is mild left ventricular hypertrophy. Left ventricular diastolic parameters are consistent with Grade I diastolic dysfunction (impaired relaxation).  2. Right ventricular systolic function is normal. The right ventricular size is normal. There is mildly elevated pulmonary artery systolic pressure. The estimated right ventricular systolic pressure is 123456 mmHg.  3. Left atrial size was severely dilated.  4. The mitral valve is grossly normal. Trivial mitral valve regurgitation.  5. The aortic valve has been repaired/replaced. History of bioprosthetic AVR with valve in valve TAVR, 26 mm Sapien 3 prosthesis. There is a moderate perivalvular leak at 9 o'clock. Aortic valve regurgitation is mild. Aortic valve mean gradient measures  13.7 mmHg.  6. The inferior vena cava is normal in size with greater than 50% respiratory variability, suggesting right atrial pressure of 3 mmHg. FINDINGS  Left Ventricle: Left ventricular ejection fraction, by estimation, is 45 to 50%. The left  ventricle has mildly decreased function. The left ventricle demonstrates regional wall motion abnormalities. The left ventricular internal cavity size was mildly dilated. There is mild left ventricular hypertrophy. Left ventricular diastolic parameters are consistent with Grade I diastolic dysfunction (impaired relaxation).  LV Wall Scoring: The basal inferior segment is akinetic. The basal inferolateral segment is hypokinetic. Right Ventricle: The right ventricular size is normal. No increase in right ventricular wall thickness. Right ventricular systolic function is normal. There is mildly elevated pulmonary artery systolic pressure. The tricuspid regurgitant velocity is 2.64  m/s, and with an assumed right atrial pressure of 3 mmHg, the estimated right ventricular systolic pressure is 123456 mmHg. Left Atrium: Left atrial size was severely dilated. Right Atrium: Right atrial size was normal in size. Pericardium: There is no evidence of pericardial effusion. Mitral Valve: The mitral valve  is grossly normal. There is mild thickening of the mitral valve leaflet(s). Trivial mitral valve regurgitation. Tricuspid Valve: The tricuspid valve is grossly normal. Tricuspid valve regurgitation is mild. Aortic Valve: The aortic valve has been repaired/replaced. Aortic valve regurgitation is mild. Aortic regurgitation PHT measures 400 msec. Aortic valve mean gradient measures 13.7 mmHg. Aortic valve peak gradient measures 28.7 mmHg. Pulmonic Valve: The pulmonic valve was grossly normal. Pulmonic valve regurgitation is not visualized. Aorta: The aortic root is normal in size and structure. Venous: The inferior vena cava is normal in size with greater than 50% respiratory variability, suggesting right atrial pressure of 3 mmHg. IAS/Shunts: No atrial level shunt detected by color flow Doppler.  LEFT VENTRICLE PLAX 2D LVIDd:         6.06 cm Diastology LVIDs:         4.90 cm LV e' lateral:   7.58 cm/s LV PW:         1.26 cm LV E/e'  lateral: 16.0 LV IVS:        1.25 cm LV e' medial:    3.23 cm/s                        LV E/e' medial:  37.5  RIGHT VENTRICLE RV S prime:     9.23 cm/s TAPSE (M-mode): 1.6 cm LEFT ATRIUM              Index LA diam:        4.50 cm  2.91 cm/m LA Vol (A2C):   114.0 ml 73.72 ml/m LA Vol (A4C):   74.8 ml  48.37 ml/m LA Biplane Vol: 93.2 ml  60.27 ml/m  AORTIC VALVE AV Vmax:           268.00 cm/s AV Vmean:          171.667 cm/s AV VTI:            0.595 m AV Peak Grad:      28.7 mmHg AV Mean Grad:      13.7 mmHg LVOT Vmax:         81.60 cm/s LVOT Vmean:        55.167 cm/s LVOT VTI:          0.190 m LVOT/AV VTI ratio: 0.32 AI PHT:            400 msec MITRAL VALVE                TRICUSPID VALVE MV Area (PHT): 5.06 cm     TR Peak grad:   27.9 mmHg MV Decel Time: 150 msec     TR Vmax:        264.00 cm/s MR Peak grad: 110.2 mmHg MR Mean grad: 69.0 mmHg     SHUNTS MR Vmax:      525.00 cm/s   Systemic VTI: 0.19 m MR Vmean:     392.0 cm/s MV E velocity: 121.00 cm/s MV A velocity: 86.90 cm/s MV E/A ratio:  1.39 Rozann Lesches MD Electronically signed by Rozann Lesches MD Signature Date/Time: 01/05/2020/11:13:50 AM    Final       The results of significant diagnostics from this hospitalization (including imaging, microbiology, ancillary and laboratory) are listed below for reference.     Microbiology: Recent Results (from the past 240 hour(s))  SARS Coronavirus 2 by RT PCR (hospital order, performed in [facility] hospital lab) Nasopharyngeal Nasopharyngeal Swab     Status: None   Collection Time:  01/31/20 11:24 AM   Specimen: Nasopharyngeal Swab  Result Value Ref Range Status   SARS Coronavirus 2 NEGATIVE NEGATIVE Final    Comment: (NOTE) SARS-CoV-2 target nucleic acids are NOT DETECTED. The SARS-CoV-2 RNA is generally detectable in upper and lower respiratory specimens during the acute phase of infection. The lowest concentration of SARS-CoV-2 viral copies this assay can detect is 250 copies / mL. A  negative result does not preclude SARS-CoV-2 infection and should not be used as the sole basis for treatment or other patient management decisions.  A negative result may occur with improper specimen collection / handling, submission of specimen other than nasopharyngeal swab, presence of viral mutation(s) within the areas targeted by this assay, and inadequate number of viral copies (<250 copies / mL). A negative result must be combined with clinical observations, patient history, and epidemiological information. Fact Sheet for Patients:   StrictlyIdeas.no Fact Sheet for Healthcare Providers: BankingDealers.co.za This test is not yet approved or cleared  by the [place] FDA and has been authorized for detection and/or diagnosis of SARS-CoV-2 by FDA under an Emergency Use Authorization (EUA).  This EUA will remain in effect (meaning this test can be used) for the duration of the COVID-19 declaration under Section 564(b)(1) of the Act, 21 U.S.C. section 360bbb-3(b)(1), unless the authorization is terminated or revoked sooner. Performed at [facility], [address], [place], [place] [address]      Labs: BNP (last 3 results) Recent Labs    01/31/20 1149 02/02/20 0618  BNP 1,406.0* 0000000*   Basic Metabolic Panel: Recent Labs  Lab 01/31/20 1149 02/01/20 0609 02/02/20 0618  NA 128* 133* 132*  K 3.8 3.9 3.9  CL 97* 96* 97*  CO2 23 26 26   GLUCOSE 89 99 92  BUN 14 14 11   CREATININE 0.89 1.02 0.99  CALCIUM 8.1* 8.2* 8.8*  MG  --   --  2.1   Liver Function Tests: Recent Labs  Lab 01/31/20 1149 02/02/20 0618  AST 18 19  ALT 16 15  ALKPHOS 129* 138*  BILITOT 0.8 1.4*  PROT 6.6 6.8  ALBUMIN 3.1* 3.3*   No results for input(s): LIPASE, AMYLASE in the last 168 hours. No results for input(s): AMMONIA in the last 168 hours. CBC: Recent Labs  Lab 01/31/20 1149 02/01/20 0609 02/02/20 0618  WBC 10.1 10.3 9.5   NEUTROABS 7.6  --   --   HGB 6.2* 8.7* 8.7*  HCT 21.3* 29.1* 29.2*  MCV 70.1* 72.6* 73.0*  PLT 334 354 357   Cardiac Enzymes: No results for input(s): CKTOTAL, CKMB, CKMBINDEX, TROPONINI in the last 168 hours. BNP: Invalid input(s): POCBNP CBG: No results for input(s): GLUCAP in the last 168 hours. D-Dimer No results for input(s): DDIMER in the last 72 hours. Hgb A1c No results for input(s): HGBA1C in the last 72 hours. Lipid Profile No results for input(s): CHOL, HDL, LDLCALC, TRIG, CHOLHDL, LDLDIRECT in the last 72 hours. Thyroid function studies No results for input(s): TSH, T4TOTAL, T3FREE, THYROIDAB in the last 72 hours.  Invalid input(s): FREET3 Anemia work up Recent Labs    01/31/20 1254  VITAMINB12 243  FOLATE 11.8  FERRITIN 5*  TIBC 332  IRON 9*  RETICCTPCT 1.8   Urinalysis    Component Value Date/Time   COLORURINE YELLOW 02/05/2011 1016   APPEARANCEUR CLEAR 02/05/2011 1016   LABSPEC 1.020 02/05/2011 1016   PHURINE 5.5 02/05/2011 1016   GLUCOSEU NEGATIVE 02/05/2011 1016   GLUCOSEU 100 (A) 07/08/2010 1902  HGBUR TRACE (A) 02/05/2011 1016   HGBUR small 07/10/2010 1004   BILIRUBINUR negative 10/17/2019 0921   KETONESUR negative 10/17/2019 0921   KETONESUR TRACE (A) 02/05/2011 1016   PROTEINUR NEGATIVE 02/05/2011 1016   UROBILINOGEN 0.2 10/17/2019 0921   UROBILINOGEN 0.2 02/05/2011 1016   NITRITE Negative 10/17/2019 0921   NITRITE NEGATIVE 02/05/2011 1016   LEUKOCYTESUR Trace (A) 10/17/2019 0921   Sepsis Labs Invalid input(s): PROCALCITONIN,  WBC,  LACTICIDVEN Microbiology Recent Results (from the past 240 hour(s))  SARS Coronavirus 2 by RT PCR (hospital order, performed in [place] hospital lab) Nasopharyngeal Nasopharyngeal Swab     Status: None   Collection Time: 01/31/20 11:24 AM   Specimen: Nasopharyngeal Swab  Result Value Ref Range Status   SARS Coronavirus 2 NEGATIVE NEGATIVE Final    Comment: (NOTE) SARS-CoV-2 target nucleic acids  are NOT DETECTED. The SARS-CoV-2 RNA is generally detectable in upper and lower respiratory specimens during the acute phase of infection. The lowest concentration of SARS-CoV-2 viral copies this assay can detect is 250 copies / mL. A negative result does not preclude SARS-CoV-2 infection and should not be used as the sole basis for treatment or other patient management decisions.  A negative result may occur with improper specimen collection / handling, submission of specimen other than nasopharyngeal swab, presence of viral mutation(s) within the areas targeted by this assay, and inadequate number of viral copies (<250 copies / mL). A negative result must be combined with clinical observations, patient history, and epidemiological information. Fact Sheet for Patients:   StrictlyIdeas.no Fact Sheet for Healthcare Providers: BankingDealers.co.za This test is not yet approved or cleared  by the [place] FDA and has been authorized for detection and/or diagnosis of SARS-CoV-2 by FDA under an Emergency Use Authorization (EUA).  This EUA will remain in effect (meaning this test can be used) for the duration of the COVID-19 declaration under Section 564(b)(1) of the Act, 21 U.S.C. section 360bbb-3(b)(1), unless the authorization is terminated or revoked sooner. Performed at [facility], [address] [place], [place] [address]      Time coordinating discharge:  I have spent 35 minutes face to face with the patient and on the ward discussing the patients care, assessment, plan and disposition with other care givers. >50% of the time was devoted counseling the patient about the risks and benefits of treatment/Discharge disposition and coordinating care.   SIGNED:   Damita Lack, MD  [facility] 02/03/2020, 8:08 AM   If 7PM-7AM, please contact night-coverage

## 2020-02-05 ENCOUNTER — Telehealth: Payer: Self-pay

## 2020-02-05 ENCOUNTER — Other Ambulatory Visit: Payer: Self-pay | Admitting: Family Medicine

## 2020-02-05 ENCOUNTER — Telehealth: Payer: Self-pay | Admitting: Family Medicine

## 2020-02-05 NOTE — Telephone Encounter (Signed)
Please try and follow through on ensuring the patient DOES keep an appointment with Urology tat you are able to arrange, as we just discussed. He absolutely needs Urology to manage his bladder cancer I did not put in a new Urology referral since he had seen Dr Blair Dolphin in the past, please try to get this sorted out, thanks

## 2020-02-05 NOTE — Progress Notes (Unsigned)
amb urology  

## 2020-02-05 NOTE — Telephone Encounter (Signed)
He has appt 6/16 at 9:00am with Dr Alyson Ingles and he is aware

## 2020-02-05 NOTE — Telephone Encounter (Signed)
Transition Care Management Follow-up Telephone Call   Date discharged? 02/03/2020               How have you been since you were released from the hospital? still tired    Do you understand why you were in the hospital? Yes, he couldn't breathe   Do you understand the discharge instructions? Yes. And has appt scheduled with Jarrett Soho NP,  and we are getting appt for him with urology    Where were you discharged to? Home    Items Reviewed:  Medications reviewed: yes  Allergies reviewed: yes  Dietary changes reviewed: yes  Referrals reviewed: yes- will make appt for urology and call him back    Functional Questionnaire:   Activities of Daily Living (ADLs):  is getting around pretty good     Any transportation issues/concerns?: no, needs am appts   Any patient concerns? not at this time   Confirmed importance and date/time of follow-up visits scheduled    May 28 10:20 HFU  Confirmed with patient if condition begins to worsen call PCP or go to the ER.  Patient was given the office number and encouraged to call back with question or concerns.  : yes

## 2020-02-06 LAB — SURGICAL PATHOLOGY

## 2020-02-06 NOTE — Telephone Encounter (Signed)
Thank you Very Much

## 2020-02-07 ENCOUNTER — Ambulatory Visit (HOSPITAL_COMMUNITY): Payer: Medicare Other | Admitting: Hematology

## 2020-02-13 ENCOUNTER — Inpatient Hospital Stay (HOSPITAL_COMMUNITY): Payer: Medicare Other

## 2020-02-13 ENCOUNTER — Ambulatory Visit (HOSPITAL_COMMUNITY)
Admission: RE | Admit: 2020-02-13 | Discharge: 2020-02-13 | Disposition: A | Discharge status: Home / Self Care | Payer: Medicare Other | Source: Ambulatory Visit | Attending: Hematology | Admitting: Hematology

## 2020-02-13 ENCOUNTER — Other Ambulatory Visit: Payer: Self-pay

## 2020-02-13 ENCOUNTER — Inpatient Hospital Stay (HOSPITAL_COMMUNITY): Payer: Medicare Other | Attending: Hematology | Admitting: Hematology

## 2020-02-13 VITALS — BP 121/60 | HR 76 | Temp 97.3°F | Resp 18 | Wt 121.0 lb

## 2020-02-13 DIAGNOSIS — I10 Essential (primary) hypertension: Secondary | ICD-10-CM | POA: Insufficient documentation

## 2020-02-13 DIAGNOSIS — M1990 Unspecified osteoarthritis, unspecified site: Secondary | ICD-10-CM | POA: Insufficient documentation

## 2020-02-13 DIAGNOSIS — I739 Peripheral vascular disease, unspecified: Secondary | ICD-10-CM | POA: Diagnosis not present

## 2020-02-13 DIAGNOSIS — C679 Malignant neoplasm of bladder, unspecified: Secondary | ICD-10-CM | POA: Diagnosis not present

## 2020-02-13 DIAGNOSIS — Z7189 Other specified counseling: Secondary | ICD-10-CM | POA: Insufficient documentation

## 2020-02-13 DIAGNOSIS — C7910 Secondary malignant neoplasm of unspecified urinary organs: Secondary | ICD-10-CM | POA: Diagnosis not present

## 2020-02-13 DIAGNOSIS — R630 Anorexia: Secondary | ICD-10-CM | POA: Insufficient documentation

## 2020-02-13 DIAGNOSIS — M7989 Other specified soft tissue disorders: Secondary | ICD-10-CM | POA: Diagnosis not present

## 2020-02-13 DIAGNOSIS — Z87891 Personal history of nicotine dependence: Secondary | ICD-10-CM | POA: Insufficient documentation

## 2020-02-13 DIAGNOSIS — I2510 Atherosclerotic heart disease of native coronary artery without angina pectoris: Secondary | ICD-10-CM | POA: Diagnosis not present

## 2020-02-13 DIAGNOSIS — Z79899 Other long term (current) drug therapy: Secondary | ICD-10-CM | POA: Insufficient documentation

## 2020-02-13 DIAGNOSIS — R600 Localized edema: Secondary | ICD-10-CM | POA: Diagnosis not present

## 2020-02-13 DIAGNOSIS — C7951 Secondary malignant neoplasm of bone: Secondary | ICD-10-CM | POA: Diagnosis not present

## 2020-02-13 DIAGNOSIS — Z7982 Long term (current) use of aspirin: Secondary | ICD-10-CM | POA: Diagnosis not present

## 2020-02-13 DIAGNOSIS — K449 Diaphragmatic hernia without obstruction or gangrene: Secondary | ICD-10-CM | POA: Diagnosis not present

## 2020-02-13 DIAGNOSIS — R5383 Other fatigue: Secondary | ICD-10-CM | POA: Insufficient documentation

## 2020-02-13 DIAGNOSIS — K209 Esophagitis, unspecified without bleeding: Secondary | ICD-10-CM | POA: Insufficient documentation

## 2020-02-13 LAB — COMPREHENSIVE METABOLIC PANEL
ALT: 13 U/L (ref 0–44)
AST: 16 U/L (ref 15–41)
Albumin: 3.2 g/dL — ABNORMAL LOW (ref 3.5–5.0)
Alkaline Phosphatase: 138 U/L — ABNORMAL HIGH (ref 38–126)
Anion gap: 9 (ref 5–15)
BUN: 15 mg/dL (ref 8–23)
CO2: 24 mmol/L (ref 22–32)
Calcium: 8.3 mg/dL — ABNORMAL LOW (ref 8.9–10.3)
Chloride: 96 mmol/L — ABNORMAL LOW (ref 98–111)
Creatinine, Ser: 0.87 mg/dL (ref 0.61–1.24)
GFR calc Af Amer: >60 mL/min (ref >60)
GFR calc non Af Amer: >60 mL/min (ref >60)
Glucose, Bld: 105 mg/dL — ABNORMAL HIGH (ref 70–99)
Potassium: 4.1 mmol/L (ref 3.5–5.1)
Sodium: 129 mmol/L — ABNORMAL LOW (ref 135–145)
Total Bilirubin: 1 mg/dL (ref 0.3–1.2)
Total Protein: 6.8 g/dL (ref 6.5–8.1)

## 2020-02-13 NOTE — Progress Notes (Signed)
[name], [place] [id number]   CLINIC:  Medical Oncology/Hematology  PCP:  [name], MD [address] [place] / [place] [address] [phone]   REASON FOR VISIT:  Follow-up for metastatic bladder cancer  CURRENT THERAPY: Gemcitabine and cisplatin.  BRIEF ONCOLOGIC HISTORY:  Oncology History   No history exists.    CANCER STAGING: Cancer Staging No matching staging information was found for the patient.  INTERVAL HISTORY:  [name], a 69 y.o. male, returns for routine follow-up of his recently diagnosed metastatic bladder cancer. [name] was last seen on 11/06/2019.   He was recently admitted to at [facility] from 01/31/2020 to 02/03/2020 for shortness of breath. Colonoscopy on 02/02/2020 showed diverticulosis and external hemorrhoids; biopsy revealed tubular adenoma.  Endoscopy showed grade a esophagitis with hiatal hernia.  He notes that he is eating the best that he can, but that he just had seven teeth pulled. He has not had any issues with weight loss.  He has transportation issues and relies on local transportation systems to get here.    REVIEW OF SYSTEMS:  Review of Systems  Constitutional: Positive for appetite change (mildly decreased) and fatigue (mild). Negative for chills and fever.  HENT:   Negative for lump/mass, mouth sores, sore throat and trouble swallowing.   Eyes: Negative for eye problems.  Respiratory: Positive for shortness of breath. Negative for chest tightness, cough and wheezing.   Cardiovascular: Positive for leg swelling (Rt leg & foot). Negative for chest pain and palpitations.  Gastrointestinal: Negative for abdominal pain, constipation, diarrhea, nausea and vomiting.  Genitourinary: Negative for bladder incontinence, dysuria, frequency and hematuria.   Musculoskeletal: Negative for arthralgias, back pain, flank pain and myalgias.  Skin: Negative for rash.  Neurological: Negative  for dizziness, headaches, light-headedness and numbness.  Hematological: Does not bruise/bleed easily.  Psychiatric/Behavioral: Negative for depression. The patient is not nervous/anxious.     PAST MEDICAL/SURGICAL HISTORY:  Past Medical History:  Diagnosis Date   Arterial embolus of lower extremity ([facility]) 11/23/2013   Arthritis    CAD (coronary artery disease)    a. 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[facility])    PVD (peripheral vascular disease) ([facility])    S/P aortic valve replacement    Details not clear -  reportedly 1971 per patient   Past Surgical History:  Procedure Laterality Date   ABDOMINAL AORTOGRAM W/LOWER EXTREMITY N/A 10/31/2018   Procedure: ABDOMINAL AORTOGRAM W/LOWER EXTREMITY;  Surgeon: Lorretta Harp, MD;  Location: [facility];  Service: Cardiovascular;  Laterality: N/A;   Aoric valve replacement  January 2005   4m Medtronic Freestyle stentless porcine bioprosthesis ([facility]   CARDIAC CATHETERIZATION     30-40% mid LAD, 20-30% ostial D1, proximal 30-40% RCA, 95% mid RCA thrombus, 40% distal stenosis. EF 45%, inferior HK.  COLONOSCOPY N/A 04/20/2014   Procedure: COLONOSCOPY;  Surgeon: Rogene Houston, MD;  [facility];  Service: Endoscopy;  Laterality: N/A;  930   COLONOSCOPY WITH PROPOFOL N/A 02/02/2020   Procedure: COLONOSCOPY WITH PROPOFOL;  Surgeon: Rogene Houston, MD;  [facility];  Service:  Endoscopy;  Laterality: N/A;   CORONARY ANGIOPLASTY WITH STENT PLACEMENT  07/2013   ESOPHAGOGASTRODUODENOSCOPY (EGD) WITH PROPOFOL N/A 02/02/2020   Procedure: ESOPHAGOGASTRODUODENOSCOPY (EGD) WITH PROPOFOL;  Surgeon: Rogene Houston, MD;  [facility];  Service: Endoscopy;  Laterality: N/A;   INGUINAL HERNIA REPAIR Bilateral 04/17/2019   Procedure: ATTEMPTED LAPAROSCOPIC BILATERAL INGUINAL HERNIA REPAIR;  Surgeon: Coralie Keens, MD;  Location: [facility];  Service: General;  Laterality: Bilateral;   INGUINAL HERNIA REPAIR Bilateral 04/17/2019   Procedure: Hernia Repair Inguinal Adult;  Surgeon: Coralie Keens, MD;  Location: [facility];  Service: General;  Laterality: Bilateral;   LEFT HEART CATHETERIZATION WITH CORONARY ANGIOGRAM N/A 08/10/2013   Procedure: LEFT HEART CATHETERIZATION WITH CORONARY ANGIOGRAM;  Surgeon: Blane Ohara, MD;  Location: [facility];  Service: Cardiovascular;  Laterality: N/A;   Left leg embolectomy  May 2012   Dr. Trula Slade - reason for lifelong coumadin   LYMPH NODE BIOPSY Left 11/21/2019   Procedure: Left Neck LYMPH NODE BIOPSY;  Surgeon: Rozetta Nunnery, MD;  Location: [facility];  Service: ENT;  Laterality: Left;   PERIPHERAL VASCULAR ATHERECTOMY  10/31/2018   Procedure: PERIPHERAL VASCULAR ATHERECTOMY;  Surgeon: Lorretta Harp, MD;  Location: [facility];  Service: Cardiovascular;;  left SFA   POLYPECTOMY  02/02/2020   Procedure: POLYPECTOMY;  Surgeon: Rogene Houston, MD;  [facility];  Service: Endoscopy;;  splenic flexure    SOCIAL HISTORY:  Social History   Socioeconomic History   Marital status: Single    Spouse name: Not on file   Number of children: 2   Years of education: Not on file   Highest education level: 11th grade  Occupational History   Occupation: disability  Tobacco Use   Smoking status: Former Smoker    Packs/day: 0.50    Years: 3.00    Pack years: 1.50    Types: Cigarettes    Start  date: 10/12/1965    Quit date: 02/19/1970    Years since quitting: 50.0   Smokeless tobacco: Never Used  Substance and Sexual Activity   Alcohol use: Not Currently   Drug use: Yes    Types: Marijuana    Comment: pt smokes weed once in the morning and once in the evening   Sexual activity: Not Currently  Other Topics Concern   Not on file  Social History Narrative   Not on file   Social Determinants of Health   Financial Resource Strain:    Difficulty of Paying Living Expenses:   Food Insecurity:    Worried About Charity fundraiser in the Last Year:    Arboriculturist in the Last Year:   Transportation Needs:    Film/video editor (Medical):    Lack of Transportation (Non-Medical):   Physical Activity:    Days of Exercise per Week:    Minutes of Exercise per Session:   Stress:    Feeling of Stress :   Social Connections:    Frequency of Communication with Friends and Family:    Frequency of Social Gatherings with Friends and Family:    Attends Religious Services:    Active Member  of Clubs or Organizations:    Attends Music therapist:    Marital Status:   Intimate Production manager Violence:    Fear of Current or Ex-Partner:    Emotionally Abused:    Physically Abused:    Sexually Abused:     FAMILY HISTORY:  Family History  Problem Relation Age of Onset   Diabetes Mother    Cirrhosis Father    COPD Sister     CURRENT MEDICATIONS:  Current Outpatient Medications  Medication Sig Dispense Refill   aspirin EC 81 MG tablet Take 1 tablet (81 mg total) by mouth daily. 90 tablet 3   atorvastatin (LIPITOR) 40 MG tablet Take 1 tablet by mouth once daily 90 tablet 0   busPIRone (BUSPAR) 10 MG tablet Take 1 tablet (10 mg total) by mouth 2 (two) times daily. 60 tablet 2   cholecalciferol (VITAMIN D) 1000 units tablet Take 1 tablet (1,000 Units total) by mouth daily. 90 tablet 3   clopidogrel (PLAVIX) 75 MG tablet Take 1 tablet by  mouth once daily (Patient taking differently: Take 75 mg by mouth daily. ) 90 tablet 0   escitalopram (LEXAPRO) 10 MG tablet Take 1 tablet (10 mg total) by mouth daily. 30 tablet 1   furosemide (LASIX) 40 MG tablet Take 1 tablet by mouth once daily 90 tablet 0   lisinopril (ZESTRIL) 20 MG tablet Take 1 tablet by mouth once daily 90 tablet 0   metoprolol succinate (TOPROL-XL) 50 MG 24 hr tablet TAKE 1 TABLET BY MOUTH ONCE DAILY WITH  OR  IMMEDIATELY  FOLLOWING  A  MEAL 90 tablet 0   montelukast (SINGULAIR) 10 MG tablet TAKE 1 TABLET BY MOUTH AT BEDTIME 90 tablet 0   pantoprazole (PROTONIX) 40 MG tablet Take 1 tablet (40 mg total) by mouth daily before breakfast. 30 tablet 2   fluticasone (FLONASE) 50 MCG/ACT nasal spray Place 2 sprays into both nostrils daily. (Patient not taking: Reported on 02/13/2020) 16 g 6   nitroGLYCERIN (NITROSTAT) 0.4 MG SL tablet Place 1 tablet (0.4 mg total) under the tongue every 5 (five) minutes as needed for chest pain. (Patient not taking: Reported on 02/13/2020) 25 tablet 0   trolamine salicylate (ASPERCREME) 10 % cream Apply 1 application topically as needed for muscle pain.     No current facility-administered medications for this visit.    ALLERGIES:  No Known Allergies  PHYSICAL EXAM:  Performance status (ECOG): 1 - Symptomatic but completely ambulatory  Vitals:   02/13/20 1112  BP: 121/60  Pulse: 76  Resp: 18  Temp: (!) 97.3 F (36.3 C)  SpO2: 100%   Wt Readings from Last 3 Encounters:  02/13/20 121 lb (54.9 kg)  02/01/20 116 lb 10 oz (52.9 kg)  01/31/20 120 lb (54.4 kg)   Physical Exam Constitutional:      Appearance: Normal appearance.  HENT:     Nose: No congestion.     Mouth/Throat:     Mouth: Mucous membranes are moist.  Eyes:     Extraocular Movements: Extraocular movements intact.     Pupils: Pupils are equal, round, and reactive to light.  Cardiovascular:     Rate and Rhythm: Normal rate and regular rhythm.     Heart  sounds: No murmur. No gallop.   Pulmonary:     Breath sounds: No wheezing, rhonchi or rales.  Abdominal:     Tenderness: There is no abdominal tenderness.  Musculoskeletal:        General: No  tenderness.     Cervical back: Normal range of motion. No tenderness.     Right lower leg: Edema present.     Left lower leg: Edema present.  Skin:    General: Skin is warm and dry.     Findings: No bruising, erythema or rash.  Neurological:     Mental Status: He is alert and oriented to person, place, and time.     Sensory: No sensory deficit.     Motor: No weakness.  Psychiatric:        Mood and Affect: Mood normal.        Behavior: Behavior normal.        Thought Content: Thought content normal.        Judgment: Judgment normal.      LABORATORY DATA:  I have reviewed the labs as listed.  CBC Latest Ref Rng & Units 02/02/2020 02/01/2020 01/31/2020  WBC 4.0 - 10.5 K/uL 9.5 10.3 10.1  Hemoglobin 13.0 - 17.0 g/dL 8.7(L) 8.7(L) 6.2(LL)  Hematocrit 39.0 - 52.0 % 29.2(L) 29.1(L) 21.3(L)  Platelets 150 - 400 K/uL 357 354 334   CMP Latest Ref Rng & Units 02/03/2020 02/02/2020 02/01/2020  Glucose 70 - 99 mg/dL 85 92 99  BUN 8 - 23 mg/dL _0 Creatinine 0.61 - 1.24 mg/dL 0.82 0.99 1.02  Sodium 135 - 145 mmol/L 130(L) 132(L) 133(L)  Potassium 3.5 - 5.1 mmol/L 3.5 3.9 3.9  Chloride 98 - 111 mmol/L 96(L) 97(L) 96(L)  CO2 22 - 32 mmol/L _1 Calcium 8.9 - 10.3 mg/dL 8.3(L) 8.8(L) 8.2(L)  Total Protein 6.5 - 8.1 g/dL 6.0(L) 6.8 -  Total Bilirubin 0.3 - 1.2 mg/dL 1.1 1.4(H) -  Alkaline Phos 38 - 126 U/L 121 138(H) -  AST 15 - 41 U/L 15 19 -  ALT 0 - 44 U/L 13 15 -   Lab Results  Component Value Date   PSA 5.1 (H) 09/12/2019   PSA 3.8 09/01/2018   PSA 2.4 08/26/2017   02/02/2020 Colon polypectomy A. COLON, SPLENIC FLEXURE, POLYPECTOMY:  - Tubular adenoma  - Negative for high-grade dysplasia or malignancy  11/21/2019 lymphadenopathy  A. LYMPH NODE, LEFT NECK, EXCISION:  -  Metastatic carcinoma COMMENT: By immunohistochemistry, the neoplastic cells are positive for cytokeratin 7, GATA3, p63 and p40 (patchy) but are negative for cytokeratin 5/6, p16 and cytokeratin 20. Overall, the immunoprofile is consistent with metastasis from a urothelial primary.    DIAGNOSTIC IMAGING:  I have independently reviewed the scans and discussed with the patient.  02/02/2020 CT Chest Abdomen and Pelvis 1. Progression of irregular bladder wall thickening with right-sided predominance suspicious for primary bladder neoplasm. 2. Increased retroperitoneal and pelvic adenopathy, concerning for progression of metastatic disease. 3. Interim development of multiple faint foci of sclerosis involving the ribs, spine and pelvic bones, consistent with skeletal metastatic disease. 4. Progression of bilateral hydronephrosis and hydroureter, presumably obstructed from bladder mass. 5. 2.7 cm intermediate density exophytic to the upper pole of right kidney seen on prior PET-CT and noted to be PET negative at that time and felt to represent hemorrhagic or proteinaceous cyst 6. Small amount of ascites.  Mild diffuse subcutaneous edema 7. 12 mm ovoid area of consolidation in the posterior right lower lobe, indeterminate for a pulmonary nodule or nonspecific area of consolidation.   ASSESSMENT & PLAN: 1.  Metastatic urothelial carcinoma: -PET scan on 10/30/2019 showed right bladder mass with right hydroureteronephrosis.  There is pelvic, retroperitoneal and left  supraclavicular adenopathy. -Left supraclavicular lymph node biopsy on 11/21/2019 showed metastatic urothelial carcinoma. -CT CAP on 02/02/2020 shows progression of bladder wall thickening.  Increased retroperitoneal and pelvic adenopathy.  Interim development of multiple faint foci of sclerosis involving ribs, spine and pelvic bones consistent with bone mets.  2.7 cm exophytic right kidney mass but PET negative.  Small amount of ascites. -I have  reviewed his CMP today.  Creatinine is 0.87.  He does not have any neuropathy. -We talked about normal prognosis of metastatic urothelial carcinoma and treatment intent in the noncurative setting. -We talked about first-line systemic therapy with gemcitabine and cisplatin followed by Avelumab maintenance therapy. -He lives far and does not drive.  He uses public transportation.  I think gemcitabine and cisplatin on day 1 and day 15 every 28 days is more suitable for him. -We will also send his biopsy for PD-L1 and foundation 1 testing.  If there is high PD-L1 expression, we can also consider pembrolizumab or atezolizumab. -We will make a referral for port placement.  I will see him back after that.  2.  Right dorsum and leg swelling: -Patient reported dorsum of the right foot and leg swelling for the past few days. -I have done Doppler in the office which was negative for DVT.  3.  Bone metastasis: -Recent CT scan showed multiple foci of sclerosis involving ribs, spine, pelvic bones consistent with skeletal metastatic disease. -We will consider denosumab to decrease skeletal related events.   Orders placed this encounter:  Orders Placed This Encounter  Procedures   US Venous Img Lower Unilateral Right   Total time spent is 40 minutes with more than 50% of the time spent face-to-face discussing new diagnosis, scan results, treatment plan, counseling and coordination of care.  Derek Jack, MD, 02/13/20 11:33 AM  [place] [phone]   I, Jacqualyn Posey, am acting as a scribe for Dr. Sanda Linger.  I, Derek Jack MD, have reviewed the above documentation for accuracy and completeness, and I agree with the above.

## 2020-02-13 NOTE — Patient Instructions (Signed)
[place] at [facility] Discharge Instructions  You were seen today by Dr. Delton Coombes. He went over your recent test results. You have stage 4 bladder cancer, which means your cancer has spread to other areas in your body. Your cancer is not curable but it is treatable. You will need a port-a-cath placed to receive your treatments. He will do a doppler today due to the swelling in your right leg and foot. He will see you back in 2 weeks for labs and follow up.   Thank you for choosing [place] at [facility] to provide your oncology and hematology care.  To afford each patient quality time with our provider, please arrive at least 15 minutes before your scheduled appointment time.   If you have a lab appointment with the [facility] please come in thru the  Main Entrance and check in at the main information desk  You need to re-schedule your appointment should you arrive 10 or more minutes late.  We strive to give you quality time with our providers, and arriving late affects you and other patients whose appointments are after yours.  Also, if you no show three or more times for appointments you may be dismissed from the clinic at the providers discretion.     Again, thank you for choosing [facility].  Our hope is that these requests will decrease the amount of time that you wait before being seen by our physicians.       _____________________________________________________________  Should you have questions after your visit to [facility], please contact our office at [phone] between the hours of 8:00 a.m. and 4:30 p.m.  Voicemails left after 4:00 p.m. will not be returned until the following business day.  For prescription refill requests, have your pharmacy contact our office and allow 72 hours.    Cancer Center Support Programs:   > Cancer Support Group  2nd Tuesday of the month 1pm-2pm, Journey Room

## 2020-02-14 ENCOUNTER — Telehealth: Payer: Self-pay | Admitting: Urology

## 2020-02-14 ENCOUNTER — Encounter (HOSPITAL_COMMUNITY): Payer: Self-pay | Admitting: *Deleted

## 2020-02-14 NOTE — Progress Notes (Signed)
I emailed pathology and ordered foundation one and PDL1 on accession # [id number].

## 2020-02-14 NOTE — Telephone Encounter (Signed)
Pt reports bil testicular swelling and penile swelling since yesterday. Denies injury. Denies difficulty voiding. Pcp asked that we reach out to pt d/t swelling. Any suggestions

## 2020-02-14 NOTE — Telephone Encounter (Signed)
Received a call from [facility] Cherly [place] NP). They stated this pt had called them c/o swollen genitals. She asked if a nurse could call him.

## 2020-02-15 DIAGNOSIS — C679 Malignant neoplasm of bladder, unspecified: Secondary | ICD-10-CM | POA: Diagnosis not present

## 2020-02-15 DIAGNOSIS — C7910 Secondary malignant neoplasm of unspecified urinary organs: Secondary | ICD-10-CM | POA: Diagnosis not present

## 2020-02-16 ENCOUNTER — Other Ambulatory Visit: Payer: Self-pay

## 2020-02-16 ENCOUNTER — Inpatient Hospital Stay: Payer: Medicare Other | Admitting: Family Medicine

## 2020-02-16 ENCOUNTER — Emergency Department (HOSPITAL_COMMUNITY): Payer: Medicare Other

## 2020-02-16 ENCOUNTER — Emergency Department (HOSPITAL_COMMUNITY)
Admission: EM | Admit: 2020-02-16 | Discharge: 2020-02-16 | Disposition: A | Discharge status: Home / Self Care | Payer: Medicare Other | Attending: Emergency Medicine | Admitting: Emergency Medicine

## 2020-02-16 ENCOUNTER — Encounter (HOSPITAL_COMMUNITY): Payer: Self-pay | Admitting: *Deleted

## 2020-02-16 DIAGNOSIS — Z87891 Personal history of nicotine dependence: Secondary | ICD-10-CM | POA: Diagnosis not present

## 2020-02-16 DIAGNOSIS — I5043 Acute on chronic combined systolic (congestive) and diastolic (congestive) heart failure: Secondary | ICD-10-CM | POA: Diagnosis not present

## 2020-02-16 DIAGNOSIS — N471 Phimosis: Secondary | ICD-10-CM | POA: Diagnosis not present

## 2020-02-16 DIAGNOSIS — Z952 Presence of prosthetic heart valve: Secondary | ICD-10-CM | POA: Diagnosis not present

## 2020-02-16 DIAGNOSIS — N5089 Other specified disorders of the male genital organs: Secondary | ICD-10-CM | POA: Insufficient documentation

## 2020-02-16 DIAGNOSIS — Z79899 Other long term (current) drug therapy: Secondary | ICD-10-CM | POA: Insufficient documentation

## 2020-02-16 DIAGNOSIS — Z955 Presence of coronary angioplasty implant and graft: Secondary | ICD-10-CM | POA: Insufficient documentation

## 2020-02-16 DIAGNOSIS — I10 Essential (primary) hypertension: Secondary | ICD-10-CM | POA: Insufficient documentation

## 2020-02-16 DIAGNOSIS — R319 Hematuria, unspecified: Secondary | ICD-10-CM | POA: Diagnosis present

## 2020-02-16 DIAGNOSIS — I2510 Atherosclerotic heart disease of native coronary artery without angina pectoris: Secondary | ICD-10-CM | POA: Diagnosis not present

## 2020-02-16 DIAGNOSIS — Z7901 Long term (current) use of anticoagulants: Secondary | ICD-10-CM | POA: Insufficient documentation

## 2020-02-16 DIAGNOSIS — N3001 Acute cystitis with hematuria: Secondary | ICD-10-CM | POA: Insufficient documentation

## 2020-02-16 LAB — CBC WITH DIFFERENTIAL/PLATELET
Abs Immature Granulocytes: 0.04 10*3/uL (ref 0.00–0.07)
Basophils Absolute: 0.1 10*3/uL (ref 0.0–0.1)
Basophils Relative: 1 %
Eosinophils Absolute: 0.2 10*3/uL (ref 0.0–0.5)
Eosinophils Relative: 2 %
HCT: 37.2 % — ABNORMAL LOW (ref 39.0–52.0)
Hemoglobin: 10.9 g/dL — ABNORMAL LOW (ref 13.0–17.0)
Immature Granulocytes: 0 %
Lymphocytes Relative: 11 %
Lymphs Abs: 1.1 10*3/uL (ref 0.7–4.0)
MCH: 22.7 pg — ABNORMAL LOW (ref 26.0–34.0)
MCHC: 29.3 g/dL — ABNORMAL LOW (ref 30.0–36.0)
MCV: 77.3 fL — ABNORMAL LOW (ref 80.0–100.0)
Monocytes Absolute: 0.7 10*3/uL (ref 0.1–1.0)
Monocytes Relative: 7 %
Neutro Abs: 8.4 10*3/uL — ABNORMAL HIGH (ref 1.7–7.7)
Neutrophils Relative %: 79 %
Platelets: 446 10*3/uL — ABNORMAL HIGH (ref 150–400)
RBC: 4.81 MIL/uL (ref 4.22–5.81)
RDW: 23.7 % — ABNORMAL HIGH (ref 11.5–15.5)
WBC: 10.6 10*3/uL — ABNORMAL HIGH (ref 4.0–10.5)
nRBC: 0 % (ref 0.0–0.2)

## 2020-02-16 LAB — URINALYSIS, ROUTINE W REFLEX MICROSCOPIC
Bilirubin Urine: NEGATIVE
Glucose, UA: NEGATIVE mg/dL
Ketones, ur: NEGATIVE mg/dL
Nitrite: NEGATIVE
Protein, ur: 100 mg/dL — AB
RBC / HPF: >50 RBC/hpf — ABNORMAL HIGH (ref 0–5)
Specific Gravity, Urine: 1.01 (ref 1.005–1.030)
WBC, UA: >50 WBC/hpf — ABNORMAL HIGH (ref 0–5)
pH: 6 (ref 5.0–8.0)

## 2020-02-16 LAB — BASIC METABOLIC PANEL
Anion gap: 10 (ref 5–15)
BUN: 12 mg/dL (ref 8–23)
CO2: 27 mmol/L (ref 22–32)
Calcium: 8.4 mg/dL — ABNORMAL LOW (ref 8.9–10.3)
Chloride: 94 mmol/L — ABNORMAL LOW (ref 98–111)
Creatinine, Ser: 0.99 mg/dL (ref 0.61–1.24)
GFR calc Af Amer: >60 mL/min (ref >60)
GFR calc non Af Amer: >60 mL/min (ref >60)
Glucose, Bld: 113 mg/dL — ABNORMAL HIGH (ref 70–99)
Potassium: 3.3 mmol/L — ABNORMAL LOW (ref 3.5–5.1)
Sodium: 131 mmol/L — ABNORMAL LOW (ref 135–145)

## 2020-02-16 MED ORDER — SULFAMETHOXAZOLE-TRIMETHOPRIM 800-160 MG PO TABS
1.0000 | ORAL_TABLET | Freq: Two times a day (BID) | ORAL | 0 refills | Status: DC
Start: 2020-02-16 — End: 2020-02-23

## 2020-02-16 MED ORDER — POTASSIUM CHLORIDE CRYS ER 20 MEQ PO TBCR
40.0000 meq | EXTENDED_RELEASE_TABLET | Freq: Once | ORAL | Status: AC
  Administered 2020-02-16: 40 meq via ORAL
  Filled 2020-02-16: qty 2

## 2020-02-16 MED ORDER — SULFAMETHOXAZOLE-TRIMETHOPRIM 800-160 MG PO TABS
1.0000 | ORAL_TABLET | Freq: Once | ORAL | Status: AC
  Administered 2020-02-16: 1 via ORAL
  Filled 2020-02-16: qty 1

## 2020-02-16 NOTE — ED Notes (Signed)
To US via WC.

## 2020-02-16 NOTE — ED Provider Notes (Signed)
[facility] Provider Note   CSN: [id number] Arrival date & time: 02/16/20  1052     History Chief Complaint  Patient presents with  . Hematuria  . Groin Pain    MAZIN FLORKOWSKI is a 69 y.o. male.  HPI      TYLAN CHESNUT is a 69 y.o. male with past medical history significant for recently diagnosed metastatic bladder cancer, coronary artery disease with acute on chronic systolic and diastolic CHF, peripheral vascular disease, prosthetic aortic valve.  He was admitted to this hospital on 01/31/2020 for symptomatic anemia.  He received blood transfusion at the time.  He has had follow-up with his oncology provider 3 days ago for routine visit.  he presents to the Emergency Department complaining of sudden onset of swelling of his penis and testicles.   He is uncircumcised and states that he cannot retract the foreskin over the head of his penis.  His symptoms are associated with difficulty urinating and hematuria.  He denies abdominal pain, fever, chills, nausea or vomiting.  He has not received treatment for his bladder cancer as of yet.   Past Medical History:  Diagnosis Date  . Arterial embolus of lower extremity ([place]) 11/23/2013  . Arthritis   . CAD (coronary artery disease)    a. s/p PCI with 3.5 x 18 BMS to mid RCA, non obst LAD, Lcx patent 08/10/13  . Depression, major, single episode, mild ([place]) 12/20/2019  . Embolus of femoral artery [facility]) 2012   Surgical repair, previously on Coumadin  . Essential hypertension   . ETOH abuse   . History of DVT (deep vein thrombosis) 2011  . History of GI bleed    February 2012   . Ingrown left greater toenail 09/04/2018  . 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 Mycotic aneurysm ([place])    Associated with GI bleed and hepatic artery aneurysm status post embolization February 2012  . Prosthetic valve endocarditis ([place])   . PVD (peripheral vascular disease) ([place])   . S/P aortic valve replacement    Details not clear -   reportedly 1971 per patient    Patient Active Problem List   Diagnosis Date Noted  . Metastatic urothelial carcinoma ([place]) 02/13/2020  . Goals of care, counseling/discussion 02/13/2020  . GI bleed 02/01/2020  . Low oxygen saturation 01/31/2020  . Acute GI bleeding 01/31/2020  . Symptomatic anemia--acute on chronic secondary to acute blood loss 01/31/2020  . Acute on chronic HFrEF (heart failure with reduced ejection fraction) -combined systolic and diastolic dysfunction CHF [id number]  . Nervously anxious 12/20/2019  . Depression, major, single episode, mild ([place]) 12/20/2019  . Hematuria 10/17/2019  . Pain with urination 10/17/2019  . Weight loss, non-intentional 10/17/2019  . Annual visit for general adult medical examination with abnormal findings 09/12/2019  . Supraclavicular lymphadenopathy 09/12/2019  . Grade 3 out of 6 intensity murmur 09/12/2019  . Bilateral recurrent inguinal hernias 03/08/2019  . Hernia of abdominal cavity 02/27/2019  . Claudication in peripheral vascular disease ([place]) 10/31/2018  . Peripheral arterial disease ([place]) 10/26/2018  . Shortness of breath on exertion 10/16/2017  . Severe aortic insufficiency 10/02/2017  . Allergic rhinitis 12/30/2015  . ED (erectile dysfunction) 03/27/2015  . Hyperlipidemia LDL goal <70 08/25/2013  . Aortic atherosclerosis ([place]) 08/12/2013  . Coronary atherosclerosis of native coronary artery 08/10/2013  . Hepatic artery aneurysm ([place]) 12/10/2010  . S/P aortic valve replacement   . Essential hypertension, benign 05/13/2010  Past Surgical History:  Procedure Laterality Date  . ABDOMINAL AORTOGRAM W/LOWER EXTREMITY N/A 10/31/2018   Procedure: ABDOMINAL AORTOGRAM W/LOWER EXTREMITY;  Surgeon: Lorretta Harp, MD;  Location: [facility];  Service: Cardiovascular;  Laterality: N/A;  . Aoric valve replacement  January 2005   49mm Medtronic Freestyle stentless porcine bioprosthesis [facility])  . CARDIAC CATHETERIZATION      30-40% mid LAD, 20-30% ostial D1, proximal 30-40% RCA, 95% mid RCA thrombus, 40% distal stenosis. EF 45%, inferior HK.  [name] COLONOSCOPY N/A 04/20/2014   Procedure: COLONOSCOPY;  Surgeon: Rogene Houston, MD;  [facility];  Service: Endoscopy;  Laterality: N/A;  930  . COLONOSCOPY WITH PROPOFOL N/A 02/02/2020   Procedure: COLONOSCOPY WITH PROPOFOL;  Surgeon: Rogene Houston, MD;  Location: [facility];  Service: Endoscopy;  Laterality: N/A;  . CORONARY ANGIOPLASTY WITH STENT PLACEMENT  07/2013  . ESOPHAGOGASTRODUODENOSCOPY (EGD) WITH PROPOFOL N/A 02/02/2020   Procedure: ESOPHAGOGASTRODUODENOSCOPY (EGD) WITH PROPOFOL;  Surgeon: Rogene Houston, MD;  [facility];  Service: Endoscopy;  Laterality: N/A;  . INGUINAL HERNIA REPAIR Bilateral 04/17/2019   Procedure: ATTEMPTED LAPAROSCOPIC BILATERAL INGUINAL HERNIA REPAIR;  Surgeon: Coralie Keens, MD;  Location: [facility];  Service: General;  Laterality: Bilateral;  . INGUINAL HERNIA REPAIR Bilateral 04/17/2019   Procedure: Hernia Repair Inguinal Adult;  Surgeon: Coralie Keens, MD;  Location: [facility];  Service: General;  Laterality: Bilateral;  . LEFT HEART CATHETERIZATION WITH CORONARY ANGIOGRAM N/A 08/10/2013   Procedure: LEFT HEART CATHETERIZATION WITH CORONARY ANGIOGRAM;  Surgeon: Blane Ohara, MD;  Location: [facility] CATH LAB;  Service: Cardiovascular;  Laterality: N/A;  . Left leg embolectomy  May 2012   Dr. Trula Slade - reason for lifelong coumadin  . LYMPH NODE BIOPSY Left 11/21/2019   Procedure: Left Neck LYMPH NODE BIOPSY;  Surgeon: Rozetta Nunnery, MD;  Location: [facility];  Service: ENT;  Laterality: Left;  . PERIPHERAL VASCULAR ATHERECTOMY  10/31/2018   Procedure: PERIPHERAL VASCULAR ATHERECTOMY;  Surgeon: Lorretta Harp, MD;  Location: [facility] CV LAB;  Service: Cardiovascular;;  left SFA  . POLYPECTOMY  02/02/2020   Procedure: POLYPECTOMY;  Surgeon: Rogene Houston, MD;  [facility];  Service: Endoscopy;;   splenic flexure       Family History  Problem Relation Age of Onset  . Diabetes Mother   . Cirrhosis Father   . COPD Sister     Social History   Tobacco Use  . Smoking status: Former Smoker    Packs/day: 0.50    Years: 3.00    Pack years: 1.50    Types: Cigarettes    Start date: 10/12/1965    Quit date: 02/19/1970    Years since quitting: 50.0  . Smokeless tobacco: Never Used  Substance Use Topics  . Alcohol use: Not Currently  . Drug use: Yes    Types: Marijuana    Comment: pt smokes weed once in the morning and once in the evening    Home Medications Prior to Admission medications   Medication Sig Start Date End Date Taking? Authorizing Provider  aspirin EC 81 MG tablet Take 1 tablet (81 mg total) by mouth daily. 11/01/17   [name], MD  atorvastatin (LIPITOR) 40 MG tablet Take 1 tablet by mouth once daily 12/21/19   Perlie Mayo, NP  busPIRone (BUSPAR) 10 MG tablet Take 1 tablet (10 mg total) by mouth 2 (two) times daily. 12/20/19   Perlie Mayo, NP  cholecalciferol (VITAMIN D) 1000 units tablet Take 1 tablet (1,000 Units total) by mouth daily. 11/01/17   [name], MD  clopidogrel (PLAVIX) 75 MG tablet Take 1 tablet by mouth once daily Patient taking differently: Take 75 mg by mouth daily.  08/29/19   [name], MD  escitalopram (LEXAPRO) 10 MG tablet Take 1 tablet (10 mg total) by mouth daily. 12/20/19   Perlie Mayo, NP  fluticasone (FLONASE) 50 MCG/ACT nasal spray Place 2 sprays into both nostrils daily. Patient not taking: Reported on 02/13/2020 03/13/19   [name], MD  furosemide (LASIX) 40 MG tablet Take 1 tablet by mouth once daily 12/21/19   Perlie Mayo, NP  lisinopril (ZESTRIL) 20 MG tablet Take 1 tablet by mouth once daily 12/21/19   Perlie Mayo, NP  metoprolol succinate (TOPROL-XL) 50 MG 24 hr tablet TAKE 1 TABLET BY MOUTH ONCE DAILY WITH  OR  IMMEDIATELY  FOLLOWING  A  MEAL 12/21/19   Perlie Mayo, NP    montelukast (SINGULAIR) 10 MG tablet TAKE 1 TABLET BY MOUTH AT BEDTIME 12/21/19   Perlie Mayo, NP  nitroGLYCERIN (NITROSTAT) 0.4 MG SL tablet Place 1 tablet (0.4 mg total) under the tongue every 5 (five) minutes as needed for chest pain. Patient not taking: Reported on 02/13/2020 12/20/19   Lorretta Harp, MD  pantoprazole (PROTONIX) 40 MG tablet Take 1 tablet (40 mg total) by mouth daily before breakfast. 02/03/20   Amin, Jeanella Flattery, MD  trolamine salicylate (ASPERCREME) 10 % cream Apply 1 application topically as needed for muscle pain.    [provider]    Allergies    Patient has no known allergies.  Review of Systems   Review of Systems  Constitutional: Negative for chills and fever.  Respiratory: Negative for chest tightness and shortness of breath.   Cardiovascular: Negative for chest pain.  Gastrointestinal: Negative for abdominal pain, nausea and vomiting.  Genitourinary: Positive for difficulty urinating, hematuria, penile swelling, scrotal swelling and testicular pain.  Musculoskeletal: Negative for back pain and neck pain.  Skin: Negative for color change and rash.  Neurological: Negative for dizziness, weakness and numbness.    Physical Exam Updated Vital Signs BP 138/72   Pulse 74   Temp 98.1 F (36.7 C)   Resp 20   Ht 5\' 4"  (1.626 m)   Wt 54.9 kg   SpO2 100%   BMI 20.77 kg/m   Physical Exam Vitals and nursing note reviewed. Exam conducted with a chaperone present.  Constitutional:      Appearance: Normal appearance. He is not ill-appearing or toxic-appearing.  HENT:     Mouth/Throat:     Mouth: Mucous membranes are moist.  Cardiovascular:     Rate and Rhythm: Normal rate and regular rhythm.     Pulses: Normal pulses.  Pulmonary:     Effort: Pulmonary effort is normal.     Breath sounds: Normal breath sounds.  Abdominal:     General: There is no distension.     Palpations: Abdomen is soft.     Tenderness: There is no abdominal  tenderness. There is no guarding.  Genitourinary:    Penis: Uncircumcised. Phimosis and swelling present. No tenderness, discharge or lesions.      Testes:        Right: Swelling present.        Left: Swelling present.     Comments: Diffuse edema of the scrotum and foreskin of the penis. Mild  tenderness of bilateral testicles.  Unable to retract foreskin over the glans. Musculoskeletal:        General: Normal range of motion.  Skin:    General: Skin is warm.     Capillary Refill: Capillary refill takes less than 2 seconds.     Findings: No erythema or rash.  Neurological:     General: No focal deficit present.     Mental Status: He is alert.     ED Results / Procedures / Treatments   Labs (all labs ordered are listed, but only abnormal results are displayed) Labs Reviewed  URINALYSIS, ROUTINE W REFLEX MICROSCOPIC - Abnormal; Notable for the following components:      Result Value   Color, Urine RED (*)    APPearance CLOUDY (*)    Hgb urine dipstick LARGE (*)    Protein, ur 100 (*)    Leukocytes,Ua MODERATE (*)    RBC / HPF >50 (*)    WBC, UA >50 (*)    Bacteria, UA FEW (*)    All other components within normal limits  CBC WITH DIFFERENTIAL/PLATELET - Abnormal; Notable for the following components:   WBC 10.6 (*)    Hemoglobin 10.9 (*)    HCT 37.2 (*)    MCV 77.3 (*)    MCH 22.7 (*)    MCHC 29.3 (*)    RDW 23.7 (*)    Platelets 446 (*)    Neutro Abs 8.4 (*)    All other components within normal limits  BASIC METABOLIC PANEL - Abnormal; Notable for the following components:   Sodium 131 (*)    Potassium 3.3 (*)    Chloride 94 (*)    Glucose, Bld 113 (*)    Calcium 8.4 (*)    All other components within normal limits  URINE CULTURE    EKG None  Radiology US SCROTUM W/DOPPLER  Result Date: 02/16/2020 CLINICAL DATA:  Scrotal swelling EXAM: SCROTAL ULTRASOUND DOPPLER ULTRASOUND OF THE TESTICLES TECHNIQUE: Complete ultrasound examination of the testicles,  epididymis, and other scrotal structures was performed. Color and spectral Doppler ultrasound were also utilized to evaluate blood flow to the testicles. COMPARISON:  None. FINDINGS: Right testicle Measurements: 4.3 x 2.6 x 2.3 cm. No mass or microlithiasis visualized. Left testicle Measurements: 4.8 x 2.9 x 3 cm. No mass or microlithiasis visualized. Right epididymis:  Normal in size and appearance. Left epididymis:  Normal in size and appearance. Hydrocele: There are moderate-sized bilateral hydroceles, greater on the left. Varicocele:  None visualized. Pulsed Doppler interrogation of both testes demonstrates normal low resistance arterial and venous waveforms bilaterally. Extensive nonspecific scrotal wall thickening is noted. IMPRESSION: 1. No evidence for testicular torsion. 2. No evidence for epididymitis or orchitis. 3. There is extensive scrotal wall thickening of unknown clinical significance. Correlation with physical exam is recommended. 4. Moderate-sized bilateral hydroceles, left greater than right. Electronically Signed   By: Constance Holster M.D.   On: 02/16/2020 15:00    Procedures Procedures (including critical care time)  Medications Ordered in ED Medications - No data to display  ED Course  I have reviewed the triage vital signs and the nursing notes.  Pertinent labs & imaging results that were available during my care of the patient were reviewed by me and considered in my medical decision making (see chart for details).    MDM Rules/Calculators/A&P                      Patient here  with recent diagnosis of metastatic bladder cancer here with onset of diffuse swelling, tenderness of his scrotum and swelling to foreskin of his penis.  He has a phimosis,  able to urinate.  No recent urological procedures per patient  Labs ordered and evaluated by me, urinalysis shows frank hematuria with greater than 50 red cells and greater than 50 white cells with few bacteria.  potassium is  also low at 3.3, it  was 4.1 three days ago.  Oral potassium ordered, urine culture pending  [facility], Dr. Alyson Ingles and discussed findings.  He recommends broad-spectrum antibiotic coverage and suggested Bactrim.  Patient has an appointment to see him on June 2.  Return precautions were discussed and patient agrees to plan.     Final Clinical Impression(s) / ED Diagnoses Final diagnoses:  Acute cystitis with hematuria  Phimosis  Scrotal edema    Rx / DC Orders ED Discharge Orders    None       Kem Parkinson, PA-C 02/16/20 1634    Elnora Morrison, MD 02/25/20 [phone]

## 2020-02-16 NOTE — Discharge Instructions (Addendum)
You have been prescribed antibiotics for your urinary tract infection.  It is important that you take the antibiotics as directed until they are finished.  Be sure to keep your appointment on June 2 to see Dr. Alyson Ingles.  Return to the emergency department if you develop worsening symptoms such as inability to urinate, fever, or vomiting.

## 2020-02-16 NOTE — ED Notes (Signed)
From US

## 2020-02-16 NOTE — ED Notes (Signed)
Difficulty urinating due to swelling of foreskin

## 2020-02-16 NOTE — ED Triage Notes (Signed)
C/o pain and swelling in groin area with hematura

## 2020-02-18 LAB — URINE CULTURE: Culture: NO GROWTH

## 2020-02-20 ENCOUNTER — Inpatient Hospital Stay (HOSPITAL_COMMUNITY): Payer: Medicare Other | Attending: Hematology | Admitting: General Practice

## 2020-02-20 ENCOUNTER — Telehealth (HOSPITAL_COMMUNITY): Payer: Self-pay | Admitting: General Practice

## 2020-02-20 DIAGNOSIS — N136 Pyonephrosis: Secondary | ICD-10-CM | POA: Insufficient documentation

## 2020-02-20 DIAGNOSIS — Z452 Encounter for adjustment and management of vascular access device: Secondary | ICD-10-CM | POA: Insufficient documentation

## 2020-02-20 DIAGNOSIS — I2510 Atherosclerotic heart disease of native coronary artery without angina pectoris: Secondary | ICD-10-CM | POA: Insufficient documentation

## 2020-02-20 DIAGNOSIS — Z87891 Personal history of nicotine dependence: Secondary | ICD-10-CM | POA: Insufficient documentation

## 2020-02-20 DIAGNOSIS — C7910 Secondary malignant neoplasm of unspecified urinary organs: Secondary | ICD-10-CM

## 2020-02-20 DIAGNOSIS — Z86718 Personal history of other venous thrombosis and embolism: Secondary | ICD-10-CM | POA: Insufficient documentation

## 2020-02-20 DIAGNOSIS — C679 Malignant neoplasm of bladder, unspecified: Secondary | ICD-10-CM | POA: Insufficient documentation

## 2020-02-20 DIAGNOSIS — I10 Essential (primary) hypertension: Secondary | ICD-10-CM | POA: Insufficient documentation

## 2020-02-20 DIAGNOSIS — R600 Localized edema: Secondary | ICD-10-CM | POA: Insufficient documentation

## 2020-02-20 DIAGNOSIS — Z79899 Other long term (current) drug therapy: Secondary | ICD-10-CM | POA: Insufficient documentation

## 2020-02-20 DIAGNOSIS — D509 Iron deficiency anemia, unspecified: Secondary | ICD-10-CM | POA: Insufficient documentation

## 2020-02-20 DIAGNOSIS — Z9221 Personal history of antineoplastic chemotherapy: Secondary | ICD-10-CM | POA: Insufficient documentation

## 2020-02-20 DIAGNOSIS — I739 Peripheral vascular disease, unspecified: Secondary | ICD-10-CM | POA: Insufficient documentation

## 2020-02-20 DIAGNOSIS — Z936 Other artificial openings of urinary tract status: Secondary | ICD-10-CM | POA: Insufficient documentation

## 2020-02-20 DIAGNOSIS — M1990 Unspecified osteoarthritis, unspecified site: Secondary | ICD-10-CM | POA: Insufficient documentation

## 2020-02-20 DIAGNOSIS — R590 Localized enlarged lymph nodes: Secondary | ICD-10-CM | POA: Insufficient documentation

## 2020-02-20 DIAGNOSIS — Z7982 Long term (current) use of aspirin: Secondary | ICD-10-CM | POA: Insufficient documentation

## 2020-02-20 DIAGNOSIS — C7951 Secondary malignant neoplasm of bone: Secondary | ICD-10-CM | POA: Insufficient documentation

## 2020-02-20 NOTE — Telephone Encounter (Signed)
[facility] CSW Progress Notes  Call to patient to assess for needs/resources - no answer, left VM w my contact information and request to call back at his convenience.  Edwyna Shell, LCSW Clinical Social Worker Phone:  [phone] Cell:  [phone]

## 2020-02-20 NOTE — Progress Notes (Signed)
[place] Initial Psychosocial Assessment Clinical Social Work  Clinical Social Work contacted by phone to assess psychosocial, emotional, mental health, and spiritual needs of the patient.   Barriers to care/review of distress screen:  - Transportation:  Do you anticipate any problems getting to appointments?  Do you have someone who can help run errands for you if you need it?  Rides w [employer] transportation paid for my  [employer] to all appointments.  He will arrange a ride - he will need to be clear about the duration of the appointments.  He may also have [employer] transportation benefit under his Medicare.     - Help at home:  What is your living situation (alone, family, other)?  If you are physically unable to care for yourself, who would you call on to help you?  Lives alone, sister could come by and check on him.  "She will come and check on me, but she has grandchildren she takes care of and her husband works."   - Support system:  What does your support system look like?  Who would you call on if you needed some kind of practical help?  What if you needed someone to talk to for emotional support?  Sister is main support, also has a girlfriend but "right now she is lame and just had an operation on her ankle and cannot get out."  Girlfriend would come and check on him but she cannot drive at the moment, lives in [place].  Has some neighbors/nephew who works 12 hour shifts.   - Finances:  Are you concerned about finances.  Considering returning to work?  If not, applying for disability?  Is retired from working in [place].  Retired many years ago after having open heart surgery ( 1971).  Was on disability and now transitioned to retirement.  Finances are tight, "I don't get that much, I try to stretch every penny I get."  Has Medicare but was told he didn't qualify for Medicaid.  No Food Stamps.    What is your understanding of where you are with your cancer?  Its cause?  Your treatment plan and what happens next?  69 year old single male, newly diagnosed w metastatic urothelia carcinoma.  Noticed swollen lymph nodes several months ago, were removed and he was told "they dont look like they have cancer."  "They ran a light into me and noted spots on liver, bladder."  Says spots "went away" and then came back again - was recently told he had cancer. "I was a little surprised, but I try to take things as they come, God will handle things for me."    What are your worries for the future as you begin treatment for cancer?  Not worried in general, somewhat worried about infusion chemotherapy "I don't know how that stuff reacts - I hope it doesn't weaken me, I hope I don't have to be bogged down in the house."  Likes to be outside, in garden, doing things.  Is growing cantaloupes, watermelon, lettuce, peas, peppers in his own garden.  Enjoys being outdoors.  Likes to get out and walk, "just for scenery."      What are your hopes and priorities during your treatment? What is important to you? What are your goals for your care?  Has had depression in the past, gets help from "God and my fiancee."  Gets medications for depression from PCP "but it makes me sleepy and  tired all the time."  Does not take antidepressants regularly, "I don't like to be tired and sleepy."  Was initially diagnosed w depression due to "my nerves, I am always fidgety, cannot sit still."    CSW Summary:  Patient and family psychosocial functioning including strengths, limitations, and coping skills:  69 yo single male, newly diagnosed w metastatic urothelial cancer.  Lives alone, limited support from sister.  No near neighbors who are able to assist as needed.  Has been on disability for many years before transitioning to retirement benefits when eligible.  Limited practical support, financial strain.  Has girlfriend but she is also limited by current illness and they cannot be together.  Does not  have robust psychosocial support, is isolated.    Identifications of barriers to care:  Transportation options are limited by hours Transportation service is open - primarily between 7 AM and 5 PM.  Cannot stay at [facility] late.  Does not have anyone to accompany him to appointments, is totally reliant on [employer] and their arrangements for transportation.  Rides must be arranged in advance.  Availability of community resources: rides are scheduled through [employer].  Patient rides w "[place]."  He will provide this contact information at this infusion appointment.  Finances are limited - if he needs additional help, he can be served by [employer] [employer] in [place] [phone]).  Clinical Social Worker follow up needed: No.  At present, seems to have practical resources needed to start treatments.    Edwyna Shell, LCSW Clinical Social Worker Phone:  [phone] Cell:  [phone]

## 2020-02-20 NOTE — Progress Notes (Deleted)
Cardiology Office Note    Date:  02/20/2020   ID:  Yiming, Leblanc 04/15/1951, MRN [id number]  PCP:  [name], MD  Cardiologist: Rozann Lesches, MD    No chief complaint on file.   History of Present Illness:    [name] is a 69 y.o. male with past medical history of aotic valve disease (s/p bioprosthetic AVR and ultimately valve in valve TAVR in 09/2017 due to torn coronary cusp of his previously placed bioprosthetic AVR), secondary cardiomyopathy (EF 20-25% in 2019, at 45-50% by repeat echo in 12/2019), CAD (s/p BMS to RCA in 2014), PAD (s/p left SFA intervention in 10/2018), HTN, HLD and recently diagnosed metastatic bladder cancer who presents to the office today for hospital follow-up.  He most recently had a phone visit with Dr. Domenic Polite in 12/2019 and reported having baseline dyspnea on exertion but denied any recent change in symptoms.  Echocardiogram earlier that month had shown a mildly reduced EF of 45 to 50% and his aortic valve prosthesis appeared to be functioning normally with moderate perivalvular leak and mild aortic regurgitation.  He was continued on his current medication regimen at that time.  In the interim, he was admitted to [facility] from 01/31/2020 to 02/03/2020 patient worsening dyspnea and found to be anemic with hemoglobin at 6.2.  Hemoccult was negative.  He was evaluated by GI during admission and colonoscopy showed diverticulosis with external hemorrhoids.  Endoscopy showed esophagitis with a hiatal hernia and daily PPI therapy was recommended.  He was restarted on ASA and Plavix at the time of discharge.  He did follow-up with oncology on 02/13/2020 in regards to his metastatic bladder cancer and was referred for port placement with plans to start Gemcitabine and Cisplatin.    Past Medical History:  Diagnosis Date  . Arterial embolus of lower extremity ([place]) 11/23/2013  . Arthritis   . CAD (coronary artery disease)    a. s/p PCI with  3.5 x 18 BMS to mid RCA, non obst LAD, Lcx patent 08/10/13  . Depression, major, single episode, mild ([place]) 12/20/2019  . Embolus of femoral artery [facility]) 2012   Surgical repair, previously on Coumadin  . Essential hypertension   . ETOH abuse   . History of DVT (deep vein thrombosis) 2011  . History of GI bleed    February 2012   . Ingrown left greater toenail 09/04/2018  . 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 Prosthetic valve endocarditis ([place])   . PVD (peripheral vascular disease) ([place])   . S/P aortic valve replacement    Details not clear -  reportedly 1971 per patient    Past Surgical History:  Procedure Laterality Date  . ABDOMINAL AORTOGRAM W/LOWER EXTREMITY N/A 10/31/2018   Procedure: ABDOMINAL AORTOGRAM W/LOWER EXTREMITY;  Surgeon: Lorretta Harp, MD;  Location: [place] CV LAB;  Service: Cardiovascular;  Laterality: N/A;  . Aoric valve replacement  January 2005   83mm Medtronic Freestyle stentless porcine bioprosthesis [facility])  . CARDIAC CATHETERIZATION     30-40% mid LAD, 20-30% ostial D1, proximal 30-40% RCA, 95% mid RCA thrombus, 40% distal stenosis. EF 45%, inferior HK.  [name] COLONOSCOPY N/A 04/20/2014   Procedure: COLONOSCOPY;  Surgeon: Rogene Houston, MD;  [facility];  Service: Endoscopy;  Laterality: N/A;  930  . COLONOSCOPY WITH PROPOFOL  N/A 02/02/2020   Procedure: COLONOSCOPY WITH PROPOFOL;  Surgeon: Rogene Houston, MD;  [facility];  Service: Endoscopy;  Laterality: N/A;  . CORONARY ANGIOPLASTY WITH STENT PLACEMENT  07/2013  . ESOPHAGOGASTRODUODENOSCOPY (EGD) WITH PROPOFOL N/A 02/02/2020   Procedure: ESOPHAGOGASTRODUODENOSCOPY (EGD) WITH PROPOFOL;  Surgeon: Rogene Houston, MD;  [facility];  Service: Endoscopy;  Laterality: N/A;  . INGUINAL HERNIA REPAIR Bilateral 04/17/2019   Procedure: ATTEMPTED LAPAROSCOPIC  BILATERAL INGUINAL HERNIA REPAIR;  Surgeon: Coralie Keens, MD;  Location: [place];  Service: General;  Laterality: Bilateral;  . INGUINAL HERNIA REPAIR Bilateral 04/17/2019   Procedure: Hernia Repair Inguinal Adult;  Surgeon: Coralie Keens, MD;  Location: [place];  Service: General;  Laterality: Bilateral;  . LEFT HEART CATHETERIZATION WITH CORONARY ANGIOGRAM N/A 08/10/2013   Procedure: LEFT HEART CATHETERIZATION WITH CORONARY ANGIOGRAM;  Surgeon: Blane Ohara, MD;  Location: [facility] C[facility];  Service: Cardiovascular;  Laterality: N/A;  . Left leg embolectomy  May 2012   Dr. Trula Slade - reason for lifelong coumadin  . LYMPH NODE BIOPSY Left 11/21/2019   Procedure: Left Neck LYMPH NODE BIOPSY;  Surgeon: Rozetta Nunnery, MD;  Location: [place];  Service: ENT;  Laterality: Left;  . PERIPHERAL VASCULAR ATHERECTOMY  10/31/2018   Procedure: PERIPHERAL VASCULAR ATHERECTOMY;  Surgeon: Lorretta Harp, MD;  Location: [place] CV [facility];  Service: Cardiovascular;;  left SFA  . POLYPECTOMY  02/02/2020   Procedure: POLYPECTOMY;  Surgeon: Rogene Houston, MD;  [facility];  Service: Endoscopy;;  splenic flexure    Current Medications: Outpatient Medications Prior to Visit  Medication Sig Dispense Refill  . aspirin EC 81 MG tablet Take 1 tablet (81 mg total) by mouth daily. 90 tablet 3  . atorvastatin (LIPITOR) 40 MG tablet Take 1 tablet by mouth once daily 90 tablet 0  . busPIRone (BUSPAR) 10 MG tablet Take 1 tablet (10 mg total) by mouth 2 (two) times daily. 60 tablet 2  . cholecalciferol (VITAMIN D) 1000 units tablet Take 1 tablet (1,000 Units total) by mouth daily. 90 tablet 3  . clopidogrel (PLAVIX) 75 MG tablet Take 1 tablet by mouth once daily (Patient taking differently: Take 75 mg by mouth daily. ) 90 tablet 0  . escitalopram (LEXAPRO) 10 MG tablet Take 1 tablet (10 mg total) by mouth daily. 30 tablet 1  . fluticasone (FLONASE) 50 MCG/ACT nasal spray Place 2 sprays into both  nostrils daily. (Patient not taking: Reported on 02/13/2020) 16 g 6  . furosemide (LASIX) 40 MG tablet Take 1 tablet by mouth once daily 90 tablet 0  . lisinopril (ZESTRIL) 20 MG tablet Take 1 tablet by mouth once daily 90 tablet 0  . metoprolol succinate (TOPROL-XL) 50 MG 24 hr tablet TAKE 1 TABLET BY MOUTH ONCE DAILY WITH  OR  IMMEDIATELY  FOLLOWING  A  MEAL 90 tablet 0  . montelukast (SINGULAIR) 10 MG tablet TAKE 1 TABLET BY MOUTH AT BEDTIME 90 tablet 0  . nitroGLYCERIN (NITROSTAT) 0.4 MG SL tablet Place 1 tablet (0.4 mg total) under the tongue every 5 (five) minutes as needed for chest pain. (Patient not taking: Reported on 02/13/2020) 25 tablet 0  . pantoprazole (PROTONIX) 40 MG tablet Take 1 tablet (40 mg total) by mouth daily before breakfast. 30 tablet 2  . sulfamethoxazole-trimethoprim (BACTRIM DS) 800-160 MG tablet Take 1 tablet by mouth 2 (two) times daily for 7 days. 14 tablet 0  . trolamine salicylate (ASPERCREME) 10 %  cream Apply 1 application topically as needed for muscle pain.     No facility-administered medications prior to visit.     Allergies:   Patient has no known allergies.   Social History   Socioeconomic History  . Marital status: Single    Spouse name: Not on file  . Number of children: 2  . Years of education: Not on file  . Highest education level: 11th grade  Occupational History  . Occupation: disability  Tobacco Use  . Smoking status: Former Smoker    Packs/day: 0.50    Years: 3.00    Pack years: 1.50    Types: Cigarettes    Start date: 10/12/1965    Quit date: 02/19/1970    Years since quitting: 50.0  . Smokeless tobacco: Never Used  Substance and Sexual Activity  . Alcohol use: Not Currently  . Drug use: Yes    Types: Marijuana    Comment: pt smokes weed once in the morning and once in the evening  . Sexual activity: Not Currently  Other Topics Concern  . Not on file  Social History Narrative  . Not on file   Social Determinants of Health    Financial Resource Strain:   . Difficulty of Paying Living Expenses:   Food Insecurity:   . Worried About Charity fundraiser in the Last Year:   . Arboriculturist in the Last Year:   Transportation Needs:   . Film/video editor (Medical):   [name] Lack of Transportation (Non-Medical):   Physical Activity:   . Days of Exercise per Week:   . Minutes of Exercise per Session:   Stress:   . Feeling of Stress :   Social Connections:   . Frequency of Communication with Friends and Family:   . Frequency of Social Gatherings with Friends and Family:   . Attends Religious Services:   . Active Member of Clubs or Organizations:   . Attends Archivist Meetings:   [name] Marital Status:      Family History:  The patient's ***family history includes COPD in his sister; Cirrhosis in his father; Diabetes in his mother.   Review of Systems:   Please see the history of present illness.     General:  No chills, fever, night sweats or weight changes.  Cardiovascular:  No chest pain, dyspnea on exertion, edema, orthopnea, palpitations, paroxysmal nocturnal dyspnea. Dermatological: No rash, lesions/masses Respiratory: No cough, dyspnea Urologic: No hematuria, dysuria Abdominal:   No nausea, vomiting, diarrhea, bright red blood per rectum, melena, or hematemesis Neurologic:  No visual changes, wkns, changes in mental status. All other systems reviewed and are otherwise negative except as noted above.   Physical Exam:    VS:  There were no vitals taken for this visit.   General: Well developed, well nourished,male appearing in no acute distress. Head: Normocephalic, atraumatic, sclera non-icteric.  Neck: No carotid bruits. JVD not elevated.  Lungs: Respirations regular and unlabored, without wheezes or rales.  Heart: ***Regular rate and rhythm. No S3 or S4.  No murmur, no rubs, or gallops appreciated. Abdomen: Soft, non-tender, non-distended. No obvious abdominal masses. Msk:  Strength  and tone appear normal for age. No obvious joint deformities or effusions. Extremities: No clubbing or cyanosis. No edema.  Distal pedal pulses are 2+ bilaterally. Neuro: Alert and oriented X 3. Moves all extremities spontaneously. No focal deficits noted. Psych:  Responds to questions appropriately with a normal affect. Skin: No rashes or lesions  noted  Wt Readings from Last 3 Encounters:  02/16/20 121 lb (54.9 kg)  02/13/20 121 lb (54.9 kg)  02/01/20 116 lb 10 oz (52.9 kg)        Studies/Labs Reviewed:   EKG:  EKG is*** ordered today.  The ekg ordered today demonstrates ***  Recent Labs: 09/12/2019: TSH 0.45 02/02/2020: B Natriuretic Peptide 1,226.0 02/03/2020: Magnesium 1.9 02/13/2020: ALT 13 02/16/2020: BUN 12; Creatinine, Ser 0.99; Hemoglobin 10.9; Platelets 446; Potassium 3.3; Sodium 131   Lipid Panel    Component Value Date/Time   CHOL 112 07/06/2019 0843   TRIG 52 07/06/2019 0843   HDL 42 07/06/2019 0843   CHOLHDL 2.7 07/06/2019 0843   VLDL 12 12/30/2016 0853   LDLCALC 57 07/06/2019 0843    Additional studies/ records that were reviewed today include:   Echocardiogram: 12/2019 IMPRESSIONS    1. Left ventricular ejection fraction, by estimation, is 45 to 50%. The  left ventricle has mildly decreased function. The left ventricle  demonstrates regional wall motion abnormalities (see scoring  diagram/findings for description). The left ventricular  internal cavity size was mildly dilated. There is mild left ventricular  hypertrophy. Left ventricular diastolic parameters are consistent with  Grade I diastolic dysfunction (impaired relaxation).  2. Right ventricular systolic function is normal. The right ventricular  size is normal. There is mildly elevated pulmonary artery systolic  pressure. The estimated right ventricular systolic pressure is 123456 mmHg.  3. Left atrial size was severely dilated.  4. The mitral valve is grossly normal. Trivial mitral valve   regurgitation.  5. The aortic valve has been repaired/replaced. History of bioprosthetic  AVR with valve in valve TAVR, 26 mm Sapien 3 prosthesis. There is a  moderate perivalvular leak at 9 o'clock. Aortic valve regurgitation is  mild. Aortic valve mean gradient measures  13.7 mmHg.  6. The inferior vena cava is normal in size with greater than 50%  respiratory variability, suggesting right atrial pressure of 3 mmHg.   Assessment:    No diagnosis found.   Plan:   In order of problems listed above:  1. ***    Medication Adjustments/Labs and Tests Ordered: Current medicines are reviewed at length with the patient today.  Concerns regarding medicines are outlined above.  Medication changes, Labs and Tests ordered today are listed in the Patient Instructions below. There are no Patient Instructions on file for this visit.   Signed, Erma Heritage, PA-C  02/20/2020 5:04 PM    [place] [address] [place], [place] [address] Phone: [phone] Fax: [phone]

## 2020-02-21 ENCOUNTER — Ambulatory Visit (INDEPENDENT_AMBULATORY_CARE_PROVIDER_SITE_OTHER): Payer: Medicare Other | Admitting: Urology

## 2020-02-21 ENCOUNTER — Other Ambulatory Visit: Payer: Self-pay

## 2020-02-21 ENCOUNTER — Ambulatory Visit: Payer: Medicare Other | Admitting: Student

## 2020-02-21 ENCOUNTER — Other Ambulatory Visit: Payer: Self-pay | Admitting: Radiology

## 2020-02-21 ENCOUNTER — Encounter: Payer: Self-pay | Admitting: Urology

## 2020-02-21 ENCOUNTER — Telehealth: Payer: Self-pay | Admitting: Urology

## 2020-02-21 VITALS — BP 142/61 | HR 65 | Temp 97.3°F | Ht 64.0 in | Wt 120.0 lb

## 2020-02-21 DIAGNOSIS — C678 Malignant neoplasm of overlapping sites of bladder: Secondary | ICD-10-CM

## 2020-02-21 DIAGNOSIS — N433 Hydrocele, unspecified: Secondary | ICD-10-CM

## 2020-02-21 DIAGNOSIS — R319 Hematuria, unspecified: Secondary | ICD-10-CM | POA: Diagnosis not present

## 2020-02-21 DIAGNOSIS — C670 Malignant neoplasm of trigone of bladder: Secondary | ICD-10-CM

## 2020-02-21 LAB — POCT URINALYSIS DIPSTICK
Bilirubin, UA: NEGATIVE
Glucose, UA: NEGATIVE
Ketones, UA: NEGATIVE
Nitrite, UA: NEGATIVE
Protein, UA: POSITIVE — AB
Spec Grav, UA: 1.01 (ref 1.010–1.025)
Urobilinogen, UA: NEGATIVE E.U./dL — AB
pH, UA: 8 (ref 5.0–8.0)

## 2020-02-21 NOTE — Telephone Encounter (Signed)
Orders placed.

## 2020-02-21 NOTE — Patient Instructions (Signed)
Hydrocele, Adult  A hydrocele is a collection of fluid in the loose pouch of skin that holds the testicles (scrotum). This may happen because:  The amount of fluid produced in the scrotum is not absorbed by the rest of the body.  Fluid from the abdomen fills the scrotum. Normally, the testicles develop in the abdomen then move (drop) into to the scrotum before birth. The tube that the testicles travel through usually closes after the testicles drop. If the tube does not close, fluid from the abdomen can fill the scrotum. This is less common in adults.  What are the causes?  The cause of a hydrocele in adults is usually not known. However, it may be caused by:  An injury to the scrotum.  An infection (epididymitis).  Decreased blood flow to the scrotum.  Twisting of a testicle (testicular torsion).  A birth defect.  A tumor or cancer of the testicle.  What are the signs or symptoms?  A hydrocele feels like a water-filled balloon. It may also feel heavy. Other symptoms include:  Swelling of the scrotum. The swelling may decrease when you lie down. You may also notice more swelling at night than in the morning.  Swelling of the groin.  Mild discomfort in the scrotum.  Pain. This can develop if the hydrocele was caused by infection or twisting. The larger the hydrocele, the more likely you are to have pain.  How is this diagnosed?  This condition may be diagnosed based on:  Physical exam.  Medical history.  You may also have other tests, including:  Imaging tests, such as ultrasound.  Blood or urine tests.  How is this treated?  Most hydroceles go away on their own. If you have no discomfort or pain, your health care provider may suggest close monitoring of your condition (called watch and wait or watchful waiting) until the condition goes away or symptoms develop. If treatment is needed, it may include:  Treating an underlying condition. This may include using an antibiotic medicine to treat an infection.  Surgery to  stop fluid from collecting in the scrotum.  Surgery to drain the fluid. Options include:  Needle aspiration. A needle is used to drain fluid. However, the fluid buildup will come back quickly.  Hydrocelectomy. For this procedure, an incision is made in the scrotum to remove the fluid sac.  Follow these instructions at home:  Watch the hydrocele for any changes.  Take over-the-counter and prescription medicines only as told by your health care provider.  If you were prescribed an antibiotic medicine, use it as told by your health care provider. Do not stop taking the antibiotic even if you start to feel better.  Keep all follow-up visits as told by your health care provider. This is important.  Contact a health care provider if:  You notice any changes in the hydrocele.  The swelling in your scrotum or groin gets worse.  The hydrocele becomes red, firm, painful, or tender to the touch.  You have a fever.  Get help right away if you:  Develop a lot of pain, or your pain becomes worse.  Summary  A hydrocele is a collection of fluid in the loose pouch of skin that holds the testicles (scrotum).  Hydroceles can cause swelling, discomfort, and sometimes pain.  In adults, the cause of a hydrocele usually is not known. However, it is sometimes caused by an infection or a rotation and twisting of the scrotum.  Treatment is usually not   may be given to ease the pain. This information is not intended to replace advice given to you by your health care provider. Make sure you discuss any questions you have with your health care provider. Document Revised: 09/18/2017 Document Reviewed: 09/18/2017 Elsevier Patient Education  2020 [employer] Inc[employer]  

## 2020-02-21 NOTE — Progress Notes (Signed)
Urological Symptom Review  Patient is experiencing the following symptoms: Frequent urination Hard to postpone urination Burning/pain with urination Get up at night to urinate Trouble starting stream Weak stream Penile pain (male only)    Review of Systems  Gastrointestinal (upper)  : Negative for upper GI symptoms  Gastrointestinal (lower) : Negative for lower GI symptoms  Constitutional : Negative for symptoms  Skin: Negative for skin symptoms  Eyes: Negative for eye symptoms  Ear/Nose/Throat : Negative for Ear/Nose/Throat symptoms  Hematologic/Lymphatic: Negative for Hematologic/Lymphatic symptoms  Cardiovascular : Leg swelling  Respiratory : Shortness of breath  Endocrine: Negative for endocrine symptoms  Musculoskeletal: Back pain Joint pain  Neurological: Negative for neurological symptoms  Psychologic: Negative for psychiatric symptoms

## 2020-02-21 NOTE — Progress Notes (Signed)
02/21/2020 9:35 AM   [name] 12/11/1950 [id number]  Referring provider: [name], MD [address] [place],  [place] [address]  scrotal swelling  HPI: [name] is a [id number] here for evaluation of scrotal swelling and metastatic bladder cancer. For the past 2 weeks he noted worsening bilateral scrotal swelling with associated penile swelling. He has known bladder cancer with pelvic lymphadenopathy. CT from 5/14 shows a large right lateral wall tumor with associated bilateral hydronephrosis. He has constant dull bilateral testicular pain. No exacerbating or alleviating events. NO other associated symptoms. No flank pain   PMH: Past Medical History:  Diagnosis Date  . Arterial embolus of lower extremity ([place]) 11/23/2013  . Arthritis   . CAD (coronary artery disease)    a. s/p PCI with 3.5 x 18 BMS to mid RCA, non obst LAD, Lcx patent 08/10/13  . Depression, major, single episode, mild ([place]) 12/20/2019  . Embolus of femoral artery [facility]) 2012   Surgical repair, previously on Coumadin  . Essential hypertension   . ETOH abuse   . History of DVT (deep vein thrombosis) 2011  . History of GI bleed    February 2012   . Ingrown left greater toenail 09/04/2018  . 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 Mycotic aneurysm ([place])    Associated with GI bleed and hepatic artery aneurysm status post embolization February 2012  . Prosthetic valve endocarditis ([place])   . PVD (peripheral vascular disease) ([place])   . S/P aortic valve replacement    Details not clear -  reportedly 1971 per patient    Surgical History: Past Surgical History:  Procedure Laterality Date  . ABDOMINAL AORTOGRAM W/LOWER EXTREMITY N/A 10/31/2018   Procedure: ABDOMINAL AORTOGRAM W/LOWER EXTREMITY;  Surgeon: Lorretta Harp, MD;  Location: [place] [facility];  Service: Cardiovascular;  Laterality: N/A;  . Aoric valve replacement  January 2005   57mm Medtronic Freestyle stentless porcine bioprosthesis  [facility])  . CARDIAC CATHETERIZATION     30-40% mid LAD, 20-30% ostial D1, proximal 30-40% RCA, 95% mid RCA thrombus, 40% distal stenosis. EF 45%, inferior HK.  [name] COLONOSCOPY N/A 04/20/2014   Procedure: COLONOSCOPY;  Surgeon: Rogene Houston, MD;  [facility];  Service: Endoscopy;  Laterality: N/A;  930  . COLONOSCOPY WITH PROPOFOL N/A 02/02/2020   Procedure: COLONOSCOPY WITH PROPOFOL;  Surgeon: Rogene Houston, MD;  [facility];  Service: Endoscopy;  Laterality: N/A;  . CORONARY ANGIOPLASTY WITH STENT PLACEMENT  07/2013  . ESOPHAGOGASTRODUODENOSCOPY (EGD) WITH PROPOFOL N/A 02/02/2020   Procedure: ESOPHAGOGASTRODUODENOSCOPY (EGD) WITH PROPOFOL;  Surgeon: Rogene Houston, MD;  [facility];  Service: Endoscopy;  Laterality: N/A;  . INGUINAL HERNIA REPAIR Bilateral 04/17/2019   Procedure: ATTEMPTED LAPAROSCOPIC BILATERAL INGUINAL HERNIA REPAIR;  Surgeon: Coralie Keens, MD;  Location: [place];  Service: General;  Laterality: Bilateral;  . INGUINAL HERNIA REPAIR Bilateral 04/17/2019   Procedure: Hernia Repair Inguinal Adult;  Surgeon: Coralie Keens, MD;  Location: [place];  Service: General;  Laterality: Bilateral;  . LEFT HEART CATHETERIZATION WITH CORONARY ANGIOGRAM N/A 08/10/2013   Procedure: LEFT HEART CATHETERIZATION WITH CORONARY ANGIOGRAM;  Surgeon: Blane Ohara, MD;  Location: [facility];  Service: Cardiovascular;  Laterality: N/A;  . Left leg embolectomy  May 2012   Dr. Trula Slade - reason for lifelong coumadin  . LYMPH NODE BIOPSY Left 11/21/2019   Procedure: Left Neck LYMPH NODE BIOPSY;  Surgeon: Melony Overly  E, MD;  Location: [place];  Service: ENT;  Laterality: Left;  . PERIPHERAL VASCULAR ATHERECTOMY  10/31/2018   Procedure: PERIPHERAL VASCULAR ATHERECTOMY;  Surgeon: Lorretta Harp, MD;  Location: [place] [facility];  Service: Cardiovascular;;  left SFA  . POLYPECTOMY  02/02/2020   Procedure: POLYPECTOMY;  Surgeon: Rogene Houston, MD;   [facility];  Service: Endoscopy;;  splenic flexure    Home Medications:  Allergies as of 02/21/2020   No Known Allergies     Medication List       Accurate as of February 21, 2020  9:35 AM. If you have any questions, ask your nurse or doctor.        aspirin EC 81 MG tablet Take 1 tablet (81 mg total) by mouth daily.   atorvastatin 40 MG tablet Commonly known as: LIPITOR Take 1 tablet by mouth once daily   busPIRone 10 MG tablet Commonly known as: BUSPAR Take 1 tablet (10 mg total) by mouth 2 (two) times daily.   cholecalciferol 25 MCG (1000 UNIT) tablet Commonly known as: VITAMIN D Take 1 tablet (1,000 Units total) by mouth daily.   clopidogrel 75 MG tablet Commonly known as: PLAVIX Take 1 tablet by mouth once daily   escitalopram 10 MG tablet Commonly known as: LEXAPRO Take 1 tablet (10 mg total) by mouth daily.   fluticasone 50 MCG/ACT nasal spray Commonly known as: FLONASE Place 2 sprays into both nostrils daily.   furosemide 40 MG tablet Commonly known as: LASIX Take 1 tablet by mouth once daily   lisinopril 20 MG tablet Commonly known as: ZESTRIL Take 1 tablet by mouth once daily   metoprolol succinate 50 MG 24 hr tablet Commonly known as: TOPROL-XL TAKE 1 TABLET BY MOUTH ONCE DAILY WITH  OR  IMMEDIATELY  FOLLOWING  A  MEAL   montelukast 10 MG tablet Commonly known as: SINGULAIR TAKE 1 TABLET BY MOUTH AT BEDTIME   nitroGLYCERIN 0.4 MG SL tablet Commonly known as: NITROSTAT Place 1 tablet (0.4 mg total) under the tongue every 5 (five) minutes as needed for chest pain.   pantoprazole 40 MG tablet Commonly known as: PROTONIX Take 1 tablet (40 mg total) by mouth daily before breakfast.   sulfamethoxazole-trimethoprim 800-160 MG tablet Commonly known as: BACTRIM DS Take 1 tablet by mouth 2 (two) times daily for 7 days.   trolamine salicylate 10 % cream Commonly known as: ASPERCREME Apply 1 application topically as needed for muscle pain.        Allergies: No Known Allergies  Family History: Family History  Problem Relation Age of Onset  . Diabetes Mother   . Cirrhosis Father   . COPD Sister     Social History:  reports that he quit smoking about 50 years ago. His smoking use included cigarettes. He started smoking about 54 years ago. He has a 1.50 pack-year smoking history. He has never used smokeless tobacco. He reports previous alcohol use. He reports current drug use. Drug: Marijuana.  ROS: All other review of systems were reviewed and are negative except what is noted above in HPI  Physical Exam: BP (!) 142/61   Pulse 65   Temp (!) 97.3 F (36.3 C)   Ht 5\' 4"  (1.626 m)   Wt 120 lb (54.4 kg)   BMI 20.60 kg/m   Constitutional:  Alert and oriented, No acute distress. HEENT: [place] AT, moist mucus membranes.  Trachea midline, no masses. Cardiovascular: No clubbing, cyanosis, or edema. Respiratory:  Normal respiratory effort, no increased work of breathing. GI: Abdomen is soft, nontender, nondistended, no abdominal masses GU: No CVA tenderness. Circumcised phallus. No masses/lesions on penis, testis, scrotum. Scrotal wall edema. Large bilateral hydroceles.  Lymph: No cervical or inguinal lymphadenopathy. Skin: No rashes, bruises or suspicious lesions. Neurologic: Grossly intact, no focal deficits, moving all 4 extremities. Psychiatric: Normal mood and affect.  Laboratory Data: Lab Results  Component Value Date   WBC 10.6 (H) 02/16/2020   HGB 10.9 (L) 02/16/2020   HCT 37.2 (L) 02/16/2020   MCV 77.3 (L) 02/16/2020   PLT 446 (H) 02/16/2020    Lab Results  Component Value Date   CREATININE 0.99 02/16/2020    Lab Results  Component Value Date   PSA 5.1 (H) 09/12/2019   PSA 3.8 09/01/2018   PSA 2.4 08/26/2017    No results found for: TESTOSTERONE  No results found for: HGBA1C  Urinalysis    Component Value Date/Time   COLORURINE RED (A) 02/16/2020 1331   APPEARANCEUR CLOUDY (A) 02/16/2020 1331     LABSPEC 1.010 02/16/2020 1331   PHURINE 6.0 02/16/2020 1331   GLUCOSEU NEGATIVE 02/16/2020 1331   GLUCOSEU 100 (A) 07/08/2010 1902   HGBUR LARGE (A) 02/16/2020 1331   HGBUR small 07/10/2010 1004   BILIRUBINUR neg 02/21/2020 0920   KETONESUR NEGATIVE 02/16/2020 1331   PROTEINUR Positive (A) 02/21/2020 0920   PROTEINUR 100 (A) 02/16/2020 1331   UROBILINOGEN negative (A) 02/21/2020 0920   UROBILINOGEN 0.2 02/05/2011 1016   NITRITE neg 02/21/2020 0920   NITRITE NEGATIVE 02/16/2020 1331   LEUKOCYTESUR Large (3+) (A) 02/21/2020 0920   LEUKOCYTESUR MODERATE (A) 02/16/2020 1331    Lab Results  Component Value Date   BACTERIA FEW (A) 02/16/2020    Pertinent Imaging: Scrotal US 5/28: images reviewed and discussed with the patient Results for orders placed during the hospital encounter of 10/17/19  DG Abd 1 View   Narrative CLINICAL DATA:  Hematuria and right-sided abdominal pain. History of kidney stones.  EXAM: ABDOMEN - 1 VIEW  COMPARISON:  Abdominal radiograph 11/02/2010. CT abdomen and pelvis 01/30/2011.  FINDINGS: No definite radiopaque renal calculi are identified although assessment of the left kidney is limited by overlying bowel. Two 3 mm calcifications projecting laterally in the right mid abdomen over the tip of the liver are lateral to the expected location of the right kidney based on the prior CT although the renal shadow is not well delineated on this radiograph. Small calcifications in the pelvis are favored to reflect a combination of phleboliths and atherosclerotic vascular calcification. Embolization coils are noted in the right upper quadrant. There is mild gaseous dilatation of multiple small bowel loops, overall greater than on the prior radiograph. Gas and an ordinary amount of stool are present in the colon. No acute osseous abnormality is seen. Aortic valve replacement is noted.  IMPRESSION: 1. No definite radiopaque urinary tract calculi  identified. 2. Mild gaseous dilatation of multiple small bowel loops, nonspecific though may reflect mild ileus.   Electronically Signed   By: [place] Bores M.D.   On: 10/17/2019 15:06    No results found for this or any previous visit. No results found for this or any previous visit. No results found for this or any previous visit. No results found for this or any previous visit. No results found for this or any previous visit. No results found for this or any previous visit. No results found for this or any previous visit.  Assessment &  Plan:    1. Metastatic bladder cancer -We discussed the management including the role of medical oncology. We also discussed bilateral nephroureteral stent placement and the patient wishes to proceed  - POCT urinalysis dipstick  2. Hydrocele, unspecified hydrocele type -His hydroceles and scrotal wall edema are related to his metastatic bladder cancer. Patient instructed to wear a scrotal support.    No follow-ups on file.  Nicolette Bang, MD  [facility] [place]

## 2020-02-21 NOTE — Telephone Encounter (Signed)
Int radiology at [facility] does not operate off of referrals. She needs an order put in so she can schedule this pt.

## 2020-02-22 ENCOUNTER — Other Ambulatory Visit (HOSPITAL_COMMUNITY): Payer: Self-pay

## 2020-02-22 ENCOUNTER — Encounter (HOSPITAL_COMMUNITY): Payer: Self-pay

## 2020-02-22 ENCOUNTER — Ambulatory Visit (HOSPITAL_COMMUNITY)
Admission: RE | Admit: 2020-02-22 | Discharge: 2020-02-22 | Disposition: A | Discharge status: Home / Self Care | Payer: Medicare Other | Source: Ambulatory Visit | Attending: Hematology | Admitting: Hematology

## 2020-02-22 ENCOUNTER — Ambulatory Visit (HOSPITAL_COMMUNITY)
Admission: RE | Admit: 2020-02-22 | Discharge: 2020-02-22 | Disposition: A | Discharge status: Home / Self Care | Payer: Medicare Other | Source: Ambulatory Visit | Attending: Urology | Admitting: Urology

## 2020-02-22 DIAGNOSIS — Z87891 Personal history of nicotine dependence: Secondary | ICD-10-CM | POA: Diagnosis not present

## 2020-02-22 DIAGNOSIS — C670 Malignant neoplasm of trigone of bladder: Secondary | ICD-10-CM | POA: Insufficient documentation

## 2020-02-22 DIAGNOSIS — Z79899 Other long term (current) drug therapy: Secondary | ICD-10-CM | POA: Insufficient documentation

## 2020-02-22 DIAGNOSIS — C678 Malignant neoplasm of overlapping sites of bladder: Secondary | ICD-10-CM | POA: Insufficient documentation

## 2020-02-22 DIAGNOSIS — Z952 Presence of prosthetic heart valve: Secondary | ICD-10-CM | POA: Insufficient documentation

## 2020-02-22 DIAGNOSIS — I2510 Atherosclerotic heart disease of native coronary artery without angina pectoris: Secondary | ICD-10-CM | POA: Insufficient documentation

## 2020-02-22 DIAGNOSIS — Z7982 Long term (current) use of aspirin: Secondary | ICD-10-CM | POA: Insufficient documentation

## 2020-02-22 DIAGNOSIS — I739 Peripheral vascular disease, unspecified: Secondary | ICD-10-CM | POA: Diagnosis not present

## 2020-02-22 DIAGNOSIS — Z86718 Personal history of other venous thrombosis and embolism: Secondary | ICD-10-CM | POA: Insufficient documentation

## 2020-02-22 DIAGNOSIS — F329 Major depressive disorder, single episode, unspecified: Secondary | ICD-10-CM | POA: Diagnosis not present

## 2020-02-22 DIAGNOSIS — Z7902 Long term (current) use of antithrombotics/antiplatelets: Secondary | ICD-10-CM | POA: Insufficient documentation

## 2020-02-22 DIAGNOSIS — Z452 Encounter for adjustment and management of vascular access device: Secondary | ICD-10-CM | POA: Diagnosis not present

## 2020-02-22 DIAGNOSIS — C679 Malignant neoplasm of bladder, unspecified: Secondary | ICD-10-CM

## 2020-02-22 DIAGNOSIS — N1330 Unspecified hydronephrosis: Secondary | ICD-10-CM | POA: Diagnosis not present

## 2020-02-22 DIAGNOSIS — C7910 Secondary malignant neoplasm of unspecified urinary organs: Secondary | ICD-10-CM

## 2020-02-22 DIAGNOSIS — I10 Essential (primary) hypertension: Secondary | ICD-10-CM | POA: Insufficient documentation

## 2020-02-22 HISTORY — PX: IR IMAGING GUIDED PORT INSERTION: IMG5740

## 2020-02-22 HISTORY — PX: IR NEPHROSTOMY PLACEMENT RIGHT: IMG6064

## 2020-02-22 HISTORY — PX: IR NEPHROSTOMY PLACEMENT LEFT: IMG6063

## 2020-02-22 LAB — PROTIME-INR
INR: 1.2 (ref 0.8–1.2)
Prothrombin Time: 14.4 seconds (ref 11.4–15.2)

## 2020-02-22 LAB — CBC WITH DIFFERENTIAL/PLATELET
Abs Immature Granulocytes: 0.04 10*3/uL (ref 0.00–0.07)
Basophils Absolute: 0.1 10*3/uL (ref 0.0–0.1)
Basophils Relative: 1 %
Eosinophils Absolute: 0.2 10*3/uL (ref 0.0–0.5)
Eosinophils Relative: 2 %
HCT: 32.9 % — ABNORMAL LOW (ref 39.0–52.0)
Hemoglobin: 10 g/dL — ABNORMAL LOW (ref 13.0–17.0)
Immature Granulocytes: 1 %
Lymphocytes Relative: 9 %
Lymphs Abs: 0.7 10*3/uL (ref 0.7–4.0)
MCH: 22.7 pg — ABNORMAL LOW (ref 26.0–34.0)
MCHC: 30.4 g/dL (ref 30.0–36.0)
MCV: 74.8 fL — ABNORMAL LOW (ref 80.0–100.0)
Monocytes Absolute: 0.8 10*3/uL (ref 0.1–1.0)
Monocytes Relative: 9 %
Neutro Abs: 6.7 10*3/uL (ref 1.7–7.7)
Neutrophils Relative %: 78 %
Platelets: 385 10*3/uL (ref 150–400)
RBC: 4.4 MIL/uL (ref 4.22–5.81)
RDW: 23.1 % — ABNORMAL HIGH (ref 11.5–15.5)
WBC: 8.5 10*3/uL (ref 4.0–10.5)
nRBC: 0 % (ref 0.0–0.2)

## 2020-02-22 LAB — BASIC METABOLIC PANEL
Anion gap: 11 (ref 5–15)
BUN: 20 mg/dL (ref 8–23)
CO2: 22 mmol/L (ref 22–32)
Calcium: 8.5 mg/dL — ABNORMAL LOW (ref 8.9–10.3)
Chloride: 95 mmol/L — ABNORMAL LOW (ref 98–111)
Creatinine, Ser: 1.41 mg/dL — ABNORMAL HIGH (ref 0.61–1.24)
GFR calc Af Amer: 59 mL/min — ABNORMAL LOW (ref >60)
GFR calc non Af Amer: 51 mL/min — ABNORMAL LOW (ref >60)
Glucose, Bld: 92 mg/dL (ref 70–99)
Potassium: 4.2 mmol/L (ref 3.5–5.1)
Sodium: 128 mmol/L — ABNORMAL LOW (ref 135–145)

## 2020-02-22 MED ORDER — FENTANYL CITRATE (PF) 100 MCG/2ML IJ SOLN
INTRAMUSCULAR | Status: DC | PRN
  Administered 2020-02-22 (×3): 50 ug via INTRAVENOUS

## 2020-02-22 MED ORDER — LIDOCAINE HCL 1 % IJ SOLN
INTRAMUSCULAR | Status: AC
  Filled 2020-02-22: qty 20

## 2020-02-22 MED ORDER — HEPARIN SOD (PORK) LOCK FLUSH 100 UNIT/ML IV SOLN
INTRAVENOUS | Status: AC
  Filled 2020-02-22: qty 5

## 2020-02-22 MED ORDER — MIDAZOLAM HCL 2 MG/2ML IJ SOLN
INTRAMUSCULAR | Status: DC | PRN
  Administered 2020-02-22 (×3): 1 mg via INTRAVENOUS

## 2020-02-22 MED ORDER — SODIUM CHLORIDE 0.9 % IV SOLN: INTRAVENOUS | Status: DC

## 2020-02-22 MED ORDER — HEPARIN SOD (PORK) LOCK FLUSH 100 UNIT/ML IV SOLN
INTRAVENOUS | Status: AC | PRN
  Administered 2020-02-22: 500 [IU] via INTRAVENOUS

## 2020-02-22 MED ORDER — FENTANYL CITRATE (PF) 100 MCG/2ML IJ SOLN
INTRAMUSCULAR | Status: AC
  Filled 2020-02-22: qty 4

## 2020-02-22 MED ORDER — HYDROCODONE-ACETAMINOPHEN 5-325 MG PO TABS: 1.0000 | ORAL_TABLET | Freq: Four times a day (QID) | ORAL | 0 refills | Status: DC | PRN

## 2020-02-22 MED ORDER — LIDOCAINE HCL (PF) 1 % IJ SOLN
INTRAMUSCULAR | Status: AC | PRN
  Administered 2020-02-22: 5 mL

## 2020-02-22 MED ORDER — HYDROCODONE-ACETAMINOPHEN 5-325 MG PO TABS: 1.0000 | ORAL_TABLET | ORAL | Status: DC | PRN

## 2020-02-22 MED ORDER — MIDAZOLAM HCL 2 MG/2ML IJ SOLN
INTRAMUSCULAR | Status: AC | PRN
  Administered 2020-02-22: 1 mg via INTRAVENOUS

## 2020-02-22 MED ORDER — LIDOCAINE VISCOUS HCL 2 % MT SOLN: 15.0000 mL | Freq: Once | OROMUCOSAL | Status: DC

## 2020-02-22 MED ORDER — IOHEXOL 300 MG/ML  SOLN
50.0000 mL | Freq: Once | INTRAMUSCULAR | Status: AC | PRN
  Administered 2020-02-22: 25 mL

## 2020-02-22 MED ORDER — LIDOCAINE-EPINEPHRINE 1 %-1:100000 IJ SOLN
INTRAMUSCULAR | Status: AC
  Filled 2020-02-22: qty 1

## 2020-02-22 MED ORDER — CEFAZOLIN SODIUM-DEXTROSE 2-4 GM/100ML-% IV SOLN
INTRAVENOUS | Status: AC
  Filled 2020-02-22: qty 100

## 2020-02-22 MED ORDER — LIDOCAINE HCL (PF) 1 % IJ SOLN
INTRAMUSCULAR | Status: DC | PRN
  Administered 2020-02-22 (×2): 10 mL

## 2020-02-22 MED ORDER — CEFAZOLIN SODIUM-DEXTROSE 2-4 GM/100ML-% IV SOLN
2.0000 g | INTRAVENOUS | Status: AC
  Administered 2020-02-22: 2 g via INTRAVENOUS

## 2020-02-22 MED ORDER — FENTANYL CITRATE (PF) 100 MCG/2ML IJ SOLN
INTRAMUSCULAR | Status: AC | PRN
  Administered 2020-02-22: 50 ug via INTRAVENOUS

## 2020-02-22 MED ORDER — MIDAZOLAM HCL 2 MG/2ML IJ SOLN
INTRAMUSCULAR | Status: AC
  Filled 2020-02-22: qty 6

## 2020-02-22 NOTE — Sedation Documentation (Signed)
Flipping pt to place port a cath.

## 2020-02-22 NOTE — Discharge Instructions (Signed)
Urgent needs - IR on call MD [place] Dr Noah Delaine (Urology) or Dr Anselm Pancoast (IR) regarding nephrostomy drains questions or concerns. Ask your doctor about follow up regarding drains and port.  Percutaneous Nephrostomy Home Guide Percutaneous nephrostomy is a procedure to insert a flexible tube into your kidney so that urine can leave your body. This procedure may be done if a medical condition prevents urine from leaving your kidney in the usual way. During the procedure, the nephrostomy tube is inserted in the right or left side of your lower back and is connected to an external drainage bag. After you have a nephrostomy tube placed, urine will collect in the drainage bag outside of your body. You will need to empty and change the drainage bag as needed. You will also need to take steps to care for the area where the nephrostomy tube was inserted (tube insertion site). How do I care for my nephrostomy tube?  Always keep your tubing, the leg bag, or the bedside drainage bag below the level of your kidney so that your urine drains freely.  Avoid activities that would cause bending or pulling of your tubing. Ask your health care provider what activities are safe for you.  When connecting your nephrostomy tube to a drainage bag, make sure that there are no kinks in the tubing and that your urine is draining freely. You may want to gently wrap an elastic bandage over the tubing. This will help keep the tubing in place and prevent it from kinking. Make sure there is no tension on the tubing so it does not become dislodged.  At night, you may want to connect your nephrostomy tube or the leg bag to a larger bedside drainage bag. How do I empty the drainage bag? Empty the leg bag or bedside drainage bag whenever it becomes ? full. Also empty it before you go to sleep. Most drainage bags have a drain at the bottom that allows urine to be emptied. Follow these  basic steps: 1. Hold the drainage bag over a toilet or collection container. Use a measuring container if your health care provider told you to measure your urine. 2. Open the drain of the bag and allow the urine to drain out. 3. After all the urine has drained from the drainage bag, close the drain fully. 4. Flush the urine down the toilet. If a collection container was used, rinse the container. How do I change the dressing around the nephrostomy tube? Change your dressing and clean your tube exit site as told by your health care provider. You may need to change the dressing every day for the first 2 weeks after having a nephrostomy tube inserted.  After the first 2 weeks, you may be told to change the dressing two times a week. Supplies needed:  Mild soap and water.  Split gauze pads, 4  4 inches (10 x 10 cm).  Gauze pads, 4  4 inches (10 x 10 cm).  Paper tape. How to change the dressing: Because of the location of your nephrostomy tube, you may need help from another person to complete dressing changes. Follow these basic steps: 1. Wash hands with soap and water. 2. Gently remove the tape and dressing from around the nephrostomy tube. Be careful not to pull on the tube while removing the dressing. Avoid using scissors because they may damage the tube. 3. Wash the skin around the tube with mild soap and water, rinse well, and  pat the skin dry with a clean cloth. 4. Check the skin around the drain for redness, swelling, pus, warmth, or a bad smell. 5. If the drain was sutured to the skin, check the suture to verify that it is still anchored in the skin. 6. Place two split gauze pads in and around the tube exit site. Do not apply ointments or alcohol to the site. 7. Place a gauze pad on top of the split gauze pad. 8. Coil the tube on top of the gauze. The tubing should rest on the gauze, not on the skin. 9. Place tape around each edge of the gauze pad. 10. Secure the nephrostomy  tubing. Make sure that the tube does not kink or become pinched. The tubing should rest on the gauze pad, not on the skin. 11. Dispose of used supplies properly.  How do I flush my nephrostomy tube? (ask your doctor at your next appointment mon June 7 if flushing is necessary) Use a saline syringe to rinse out (flush) your nephrostomy tube as told by your health care provider. Flushing is easier if a three-way stopcock is placed between the tube and the drainage bag. One connection of the stopcock connects to your tube, the second connects to the drainage bag, and the third is usually covered with a cap. The lever on the stopcock points to the direction on the stopcock that is closed to flow. Normally, the lever points in the direction of the cap to allow urine to drain from the tube to the drainage bag. Supplies needed:  Rubbing alcohol wipe.  10 mL 0.9% saline syringe. How to flush the tube: 1. Move the lever of the three-way stopcock so it points toward the drainage bag. 2. Clean the cap with a rubbing alcohol wipe. 3. Screw the tip of a 10 mL 0.9% saline syringe onto the cap. 4. Using the syringe plunger, slowly push the 10 mL 0.9% saline in the syringe over 5-10 seconds. If resistance is met or pain occurs while pushing, stop pushing the saline. 5. Remove the syringe from the cap. 6. Return the stopcock lever to the usual position, pointing in the direction of the cap. 7. Dispose of used supplies properly. How do I replace the drainage bag? Replace the drainage bag, three-way stopcock, and any extension tubing as told by your health care provider. Make sure you always have an extra drainage bag and connecting tubing available. 1. Empty urine from your drainage bag. 2. Gather a new drainage bag, three-way stopcock, and any extension tubing. 3. Remove the drainage bag, three-way stopcock, and any extension tubing from the nephrostomy tube. 4. Attach the new leg bag or bedside drainage bag,  three-way stopcock, and any extension tubing to the nephrostomy tube. 5. Dispose of the used drainage bag, three-way stopcock, and any extension. Contact a health care provider if:  You have problems with any of the valves or tubing.  You have persistent pain or soreness in your back.  You have more redness, swelling, or pain around your tube insertion site.  You have more fluid or blood coming from your tube insertion site.  Your tube insertion site feels warm to the touch.  You have pus or a bad smell coming from your tube insertion site.  You have increased urine output or you feel burning when urinating. Get help right away if:  You have pain in your abdomen during the first week.  You have chest pain or have trouble breathing.  You  have a new appearance of blood in your urine.  You have a fever or chills.  You have back pain that is not relieved by your medicine.  You have decreased urine output.  Your nephrostomy tube comes out. This information is not intended to replace advice given to you by your health care provider. Make sure you discuss any questions you have with your health care provider. Document Revised: 12/29/2018 Document Reviewed: 06/19/2016 Elsevier Patient Education  [place] Insertion, Care After Wound - May remove dressing and shower in 24 to 48 hours.  Keep site clean and dry.  Replace with bandaid. Do not submerge in tub or water until site healing well.  If ordered by your provider, may start Emla cream in 2 weeks or after incision is healed.  After completion of treatment, your provider should have you set up for monthly port flushes. Port must be flushed on a monthly basis and with each use.  This sheet gives you information about how to care for yourself after your procedure. Your health care provider may also give you more specific instructions. If you have problems or questions, contact your health care  provider. What can I expect after the procedure? After the procedure, it is common to have:  Discomfort at the port insertion site.  Bruising on the skin over the port. This should improve over 3-4 days. Follow these instructions at home: [facility] care  After your port is placed, you will get a manufacturer's information card. The card has information about your port. Keep this card with you at all times.  Take care of the port as told by your health care provider. Ask your health care provider if you or a family member can get training for taking care of the port at home. A home health care nurse may also take care of the port.  Make sure to remember what type of port you have. Incision care   Follow instructions from your health care provider about how to take care of your port insertion site. Make sure you: ? Wash your hands with soap and water before and after you change your bandage (dressing). If soap and water are not available, use hand sanitizer. ? Change your dressing as told by your health care provider. ? Leave stitches (sutures), skin glue, or adhesive strips in place. These skin closures may need to stay in place for 2 weeks or longer. If adhesive strip edges start to loosen and curl up, you may trim the loose edges. Do not remove adhesive strips completely unless your health care provider tells you to do that.  Check your port insertion site every day for signs of infection. Check for: ? Redness, swelling, or pain. ? Fluid or blood. ? Warmth. ? Pus or a bad smell. Activity  Return to your normal activities as told by your health care provider. Ask your health care provider what activities are safe for you.  Do not lift anything that is heavier than 10 lb (4.5 kg), or the limit that you are told, until your health care provider says that it is safe. General instructions  Take over-the-counter and prescription medicines only as told by your health care provider.  Do not  take baths, swim, or use a hot tub until your health care provider approves. Ask your health care provider if you may take showers. You may only be allowed to take sponge baths.  Do not drive for 24 hours if you  were given a sedative during your procedure.  Wear a medical alert bracelet in case of an emergency. This will tell any health care providers that you have a port.  Keep all follow-up visits as told by your health care provider. This is important. Contact a health care provider if:  You cannot flush your port with saline as directed, or you cannot draw blood from the port.  You have a fever or chills.  You have redness, swelling, or pain around your port insertion site.  You have fluid or blood coming from your port insertion site.  Your port insertion site feels warm to the touch.  You have pus or a bad smell coming from the port insertion site. Get help right away if:  You have chest pain or shortness of breath.  You have bleeding from your port that you cannot control. Summary  Take care of the port as told by your health care provider. Keep the manufacturer's information card with you at all times.  Change your dressing as told by your health care provider.  Contact a health care provider if you have a fever or chills or if you have redness, swelling, or pain around your port insertion site.  Keep all follow-up visits as told by your health care provider. This information is not intended to replace advice given to you by your health care provider. Make sure you discuss any questions you have with your health care provider. Document Revised: 04/05/2018 Document Reviewed: 04/05/2018 Elsevier Patient Education  [place].   Moderate Conscious Sedation, Adult, Care After These instructions provide you with information about caring for yourself after your procedure. Your health care provider may also give you more specific instructions. Your treatment has been  planned according to current medical practices, but problems sometimes occur. Call your health care provider if you have any problems or questions after your procedure. What can I expect after the procedure? After your procedure, it is common:  To feel sleepy for several hours.  To feel clumsy and have poor balance for several hours.  To have poor judgment for several hours.  To vomit if you eat too soon. Follow these instructions at home: For at least 24 hours after the procedure:  Do not: ? Participate in activities where you could fall or become injured. ? Drive. ? Use heavy machinery. ? Drink alcohol. ? Take sleeping pills or medicines that cause drowsiness. ? Make important decisions or sign legal documents. ? Take care of children on your own.  Rest. Eating and drinking  Follow the diet recommended by your health care provider.  If you vomit: ? Drink water, juice, or soup when you can drink without vomiting. ? Make sure you have little or no nausea before eating solid foods. General instructions  Have a responsible adult stay with you until you are awake and alert.  Take over-the-counter and prescription medicines only as told by your health care provider.  If you smoke, do not smoke without supervision.  Keep all follow-up visits as told by your health care provider. This is important. Contact a health care provider if:  You keep feeling nauseous or you keep vomiting.  You feel light-headed.  You develop a rash.  You have a fever. Get help right away if:  You have trouble breathing. This information is not intended to replace advice given to you by your health care provider. Make sure you discuss any questions you have with your health care provider.  Document Revised: 08/20/2017 Document Reviewed: 12/28/2015 Elsevier Patient Education  2020 [employer].

## 2020-02-22 NOTE — H&P (Signed)
Chief Complaint: Patient was seen in consultation today for bladder cancer with associated bilateral ureteral obstruction/Port-a-cath placement and bilateral nephrostomy tube placements.  Referring Physician(s): Katragadda,Sreedhar; Cleon Gustin  Supervising Physician: Markus Daft  Patient Status: [facility] - Out-pt  History of Present Illness: [name] is a 69 y.o. male with a past medical history of hypertension, PVD, CAD, s/p aortic valve replacement, congenital heart murmur, DVT, GI bleed, bladder cancer, alcohol abuse, arthritis, and depression. He was unfortunately diagnosed with bladder cancer in 10/2019. His cancer is managed by Dr. Delton Coombes. He has tentative plans to begin systemic chemotherapy as management. In addition, patient was found to have bilateral hydronephrosis secondary to bilateral urethral obstruction from tumor burden. He was referred to urology for further management, who recommended bilateral nephrostomy tube placements.  IR requested by Dr. Delton Coombes for possible image-guided Port-a-cath placement. IR requested by Dr. Alyson Ingles for possible image-guided bilateral percutaneous nephrostomy tube placements. Patient awake and alert laying in bed with no complaints at this time. Denies fever, chills, chest pain, dyspnea, abdominal pain, or headache.  Currently taking Plavix 75 mg once daily- LD Friday 02/16/2020.   Past Medical History:  Diagnosis Date  . Arterial embolus of lower extremity ([place]) 11/23/2013  . Arthritis   . CAD (coronary artery disease)    a. s/p PCI with 3.5 x 18 BMS to mid RCA, non obst LAD, Lcx patent 08/10/13  . Depression, major, single episode, mild ([place]) 12/20/2019  . Embolus of femoral artery [facility]) 2012   Surgical repair, previously on Coumadin  . Essential hypertension   . ETOH abuse   . History of DVT (deep vein thrombosis) 2011  . History of GI bleed    February 2012   . Ingrown left greater toenail 09/04/2018  . 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 Prosthetic valve endocarditis ([place])   . PVD (peripheral vascular disease) ([place])   . S/P aortic valve replacement    Details not clear -  reportedly 1971 per patient    Past Surgical History:  Procedure Laterality Date  . ABDOMINAL AORTOGRAM W/LOWER EXTREMITY N/A 10/31/2018   Procedure: ABDOMINAL AORTOGRAM W/LOWER EXTREMITY;  Surgeon: Lorretta Harp, MD;  Location: [place] CV LAB;  Service: Cardiovascular;  Laterality: N/A;  . Aoric valve replacement  January 2005   45mm Medtronic Freestyle stentless porcine bioprosthesis [facility])  . CARDIAC CATHETERIZATION     30-40% mid LAD, 20-30% ostial D1, proximal 30-40% RCA, 95% mid RCA thrombus, 40% distal stenosis. EF 45%, inferior HK.  [name] COLONOSCOPY N/A 04/20/2014   Procedure: COLONOSCOPY;  Surgeon: Rogene Houston, MD;  [facility];  Service: Endoscopy;  Laterality: N/A;  930  . COLONOSCOPY WITH PROPOFOL N/A 02/02/2020   Procedure: COLONOSCOPY WITH PROPOFOL;  Surgeon: Rogene Houston, MD;  [facility];  Service: Endoscopy;  Laterality: N/A;  . CORONARY ANGIOPLASTY WITH STENT PLACEMENT  07/2013  . ESOPHAGOGASTRODUODENOSCOPY (EGD) WITH PROPOFOL N/A 02/02/2020   Procedure: ESOPHAGOGASTRODUODENOSCOPY (EGD) WITH PROPOFOL;  Surgeon: Rogene Houston, MD;  [facility];  Service: Endoscopy;  Laterality: N/A;  . INGUINAL HERNIA REPAIR Bilateral 04/17/2019   Procedure: ATTEMPTED LAPAROSCOPIC BILATERAL INGUINAL HERNIA REPAIR;  Surgeon: Coralie Keens, MD;  Location: [place];  Service: General;  Laterality: Bilateral;  . INGUINAL HERNIA REPAIR Bilateral 04/17/2019   Procedure: Hernia Repair Inguinal Adult;  Surgeon: Coralie Keens, MD;  Location: [place];  Service: General;  Laterality: Bilateral;  . LEFT HEART CATHETERIZATION WITH CORONARY ANGIOGRAM N/A 08/10/2013    Procedure: LEFT HEART CATHETERIZATION WITH CORONARY ANGIOGRAM;  Surgeon: Blane Ohara, MD;  Location: [facility] CATH LAB;  Service: Cardiovascular;  Laterality: N/A;  . Left leg embolectomy  May 2012   Dr. Trula Slade - reason for lifelong coumadin  . LYMPH NODE BIOPSY Left 11/21/2019   Procedure: Left Neck LYMPH NODE BIOPSY;  Surgeon: Rozetta Nunnery, MD;  Location: [place];  Service: ENT;  Laterality: Left;  . PERIPHERAL VASCULAR ATHERECTOMY  10/31/2018   Procedure: PERIPHERAL VASCULAR ATHERECTOMY;  Surgeon: Lorretta Harp, MD;  Location: [place] CV LAB;  Service: Cardiovascular;;  left SFA  . POLYPECTOMY  02/02/2020   Procedure: POLYPECTOMY;  Surgeon: Rogene Houston, MD;  [facility];  Service: Endoscopy;;  splenic flexure    Allergies: Patient has no known allergies.  Medications: Prior to Admission medications   Medication Sig Start Date End Date Taking? Authorizing Provider  aspirin EC 81 MG tablet Take 1 tablet (81 mg total) by mouth daily. 11/01/17   [name], MD  atorvastatin (LIPITOR) 40 MG tablet Take 1 tablet by mouth once daily 12/21/19   Perlie Mayo, NP  busPIRone (BUSPAR) 10 MG tablet Take 1 tablet (10 mg total) by mouth 2 (two) times daily. 12/20/19   Perlie Mayo, NP  cholecalciferol (VITAMIN D) 1000 units tablet Take 1 tablet (1,000 Units total) by mouth daily. 11/01/17   [name], MD  clopidogrel (PLAVIX) 75 MG tablet Take 1 tablet by mouth once daily Patient taking differently: Take 75 mg by mouth daily.  08/29/19   [name], MD  escitalopram (LEXAPRO) 10 MG tablet Take 1 tablet (10 mg total) by mouth daily. 12/20/19   Perlie Mayo, NP  fluticasone (FLONASE) 50 MCG/ACT nasal spray Place 2 sprays into both nostrils daily. 03/13/19   [name], MD  furosemide (LASIX) 40 MG tablet Take 1 tablet by mouth once daily 12/21/19   Perlie Mayo, NP  lisinopril (ZESTRIL) 20 MG tablet Take 1 tablet by mouth once daily  12/21/19   Perlie Mayo, NP  metoprolol succinate (TOPROL-XL) 50 MG 24 hr tablet TAKE 1 TABLET BY MOUTH ONCE DAILY WITH  OR  IMMEDIATELY  FOLLOWING  A  MEAL 12/21/19   Perlie Mayo, NP  montelukast (SINGULAIR) 10 MG tablet TAKE 1 TABLET BY MOUTH AT BEDTIME 12/21/19   Perlie Mayo, NP  nitroGLYCERIN (NITROSTAT) 0.4 MG SL tablet Place 1 tablet (0.4 mg total) under the tongue every 5 (five) minutes as needed for chest pain. 12/20/19   Lorretta Harp, MD  pantoprazole (PROTONIX) 40 MG tablet Take 1 tablet (40 mg total) by mouth daily before breakfast. 02/03/20   Amin, Jeanella Flattery, MD  sulfamethoxazole-trimethoprim (BACTRIM DS) 800-160 MG tablet Take 1 tablet by mouth 2 (two) times daily for 7 days. 02/16/20 02/23/20  Triplett, Tammy, PA-C  trolamine salicylate (ASPERCREME) 10 % cream Apply 1 application topically as needed for muscle pain.    [provider]     Family History  Problem Relation Age of Onset  . Diabetes Mother   . Cirrhosis Father   . COPD Sister     Social History   Socioeconomic History  . Marital status: Single    Spouse name: Not on file  . Number of children: 2  .  Years of education: Not on file  . Highest education level: 11th grade  Occupational History  . Occupation: disability  Tobacco Use  . Smoking status: Former Smoker    Packs/day: 0.50    Years: 3.00    Pack years: 1.50    Types: Cigarettes    Start date: 10/12/1965    Quit date: 02/19/1970    Years since quitting: 50.0  . Smokeless tobacco: Never Used  Substance and Sexual Activity  . Alcohol use: Not Currently  . Drug use: Yes    Types: Marijuana    Comment: pt smokes weed once in the morning and once in the evening  . Sexual activity: Not Currently  Other Topics Concern  . Not on file  Social History Narrative  . Not on file   Social Determinants of Health   Financial Resource Strain:   . Difficulty of Paying Living Expenses:   Food Insecurity:   . Worried About Sales executive in the Last Year:   . Arboriculturist in the Last Year:   Transportation Needs:   . Film/video editor (Medical):   [name] Lack of Transportation (Non-Medical):   Physical Activity:   . Days of Exercise per Week:   . Minutes of Exercise per Session:   Stress:   . Feeling of Stress :   Social Connections:   . Frequency of Communication with Friends and Family:   . Frequency of Social Gatherings with Friends and Family:   . Attends Religious Services:   . Active Member of Clubs or Organizations:   . Attends Archivist Meetings:   [name] Marital Status:      Review of Systems: A 12 point ROS discussed and pertinent positives are indicated in the HPI above.  All other systems are negative.  Review of Systems  Constitutional: Negative for chills and fever.  Respiratory: Negative for shortness of breath and wheezing.   Cardiovascular: Negative for chest pain and palpitations.  Gastrointestinal: Negative for abdominal pain.  Neurological: Negative for headaches.  Psychiatric/Behavioral: Negative for behavioral problems and confusion.    Vital Signs: There were no vitals taken for this visit.  Physical Exam Vitals and nursing note reviewed.  Constitutional:      General: He is not in acute distress.    Appearance: Normal appearance.  Cardiovascular:     Rate and Rhythm: Normal rate and regular rhythm.     Heart sounds: Murmur present.  Pulmonary:     Effort: Pulmonary effort is normal. No respiratory distress.     Breath sounds: Normal breath sounds. No wheezing.  Skin:    General: Skin is warm and dry.  Neurological:     Mental Status: He is alert and oriented to person, place, and time.      MD Evaluation Airway: WNL Heart: WNL Abdomen: WNL Chest/ Lungs: WNL ASA  Classification: 3 Mallampati/Airway Score: Two   Imaging: CT CHEST W CONTRAST  Result Date: 02/02/2020 CLINICAL DATA:  Abdominal pain fever postop shortness of breath low hemoglobin EXAM:  CT CHEST, ABDOMEN, AND PELVIS WITH CONTRAST TECHNIQUE: Multidetector CT imaging of the chest, abdomen and pelvis was performed following the standard protocol during bolus administration of intravenous contrast. CONTRAST:  162mL OMNIPAQUE IOHEXOL 300 MG/ML  SOLN COMPARISON:  Chest x-ray 02/01/2020, PET CT 10/30/2019, CT 09/19/2019, 02/09/2011 FINDINGS: CT CHEST FINDINGS Cardiovascular: Prior stent graft repair of the ascending aorta, aortic valve and aortic root. Moderate aortic atherosclerosis. Proximal arch diameter of  3.7 cm as compared with 4 cm previously. Coronary vascular calcifications. Cardiomegaly. No pericardial effusion. Mediastinum/Nodes: Midline trachea. No thyroid mass. Incompletely visualized left supraclavicular nodes. No significant axillary, mediastinal or hilar adenopathy. Esophagus within normal limits. Lungs/Pleura: Blebs at the right apex. Calcified right apical lung nodule/granuloma. No pleural effusion or pneumothorax. Subsegmental atelectasis at the right base. Ovoid area of consolidation in the posterior right lower lobe measuring 15 mm. Mild right upper lobe pleural nodularity, not clearly seen on previous exam. Musculoskeletal: Post sternotomy changes. Sclerotic foci now evident within the right sixth rib posteriorly, eighth posterior rib, and seventh lateral rib. Sclerotic focus within the left sixth and eighth lateral ribs. Sclerosis within the posterior T7 vertebral body. CT ABDOMEN PELVIS FINDINGS Hepatobiliary: No focal hepatic abnormality. No biliary dilatation. No calcified gallstone. Coils at the porta hepatis. Pancreas: Unremarkable. No pancreatic ductal dilatation or surrounding inflammatory changes. Spleen: Lobulated morphology with multiple splenules. Probable dystrophic calcification or granuloma. Adrenals/Urinary Tract: Adrenal glands are within normal limits. Cortical scarring within the mid pole of the right kidney and mid to upper pole left kidney. Exophytic cyst upper  pole right kidney. 2.7 cm intermediate exophytic density off the upper pole right kidney noted to be PET negative and presumably representing hemorrhagic or proteinaceous cyst. Diffuse irregular bladder wall thickening with right-sided predominance. This is increased compared to prior. Dominant masslike region of thickening involving the right bladder measures approximately 7 x 4.3 cm, series 2, image number 95. Multiple nodules or irregular foci of bladder wall thickening posteriorly. Bilateral hydronephrosis and hydroureter right greater than left and increased in the interval. No ureteral stones. Stomach/Bowel: Stomach is nonenlarged. No dilated small bowel. No bowel wall thickening. Vascular/Lymphatic: Extensive aortic atherosclerosis with aortic tortuosity. No aneurysm. Increased abdominopelvic lymphadenopathy. Right retrocrural node measures 9 mm compared with 6 mm previously, series 2, image number 54. Aortocaval lymph node measures 9 mm compared with 8 mm previously, series 2, image number 69. Left para aortic lymph node measures 10 mm compared with 7 mm previously, series 2, image number 69. Additional left para aortic lymph node measuring 13 mm, series 2, image number 63. Left pelvic sidewall lymph node measures 14 mm, compared with 7 mm previously, series 2, image number 98. Ext right external iliac node measures 15 mm compared with 12 mm previously, series 2, image number 97. Right common iliac node measuring 24 mm, series 2, image number 91. Reproductive: Prostate is enlarged. Other: Negative for free air. Mild perirectal soft tissue stranding. Diffuse subcutaneous edema. Trace free fluid in the abdomen and pelvis. No free air Musculoskeletal: Intramuscular lipoma within the inferior right gluteus musculature. New faint foci of sclerosis within the left sacrum, series 2, image number 91 and the left iliac bone, series 2, image number 87. Grade 1 anterolisthesis L4 on L5 with endplate degenerative  changes. IMPRESSION: 1. Progression of irregular bladder wall thickening with right-sided predominance suspicious for primary bladder neoplasm. 2. Increased retroperitoneal and pelvic adenopathy, concerning for progression of metastatic disease. 3. Interim development of multiple faint foci of sclerosis involving the ribs, spine and pelvic bones, consistent with skeletal metastatic disease. 4. Progression of bilateral hydronephrosis and hydroureter, presumably obstructed from bladder mass. 5. 2.7 cm intermediate density exophytic to the upper pole of right kidney seen on prior PET-CT and noted to be PET negative at that time and felt to represent hemorrhagic or proteinaceous cyst 6. Small amount of ascites.  Mild diffuse subcutaneous edema 7. 12 mm ovoid area of consolidation in the  posterior right lower lobe, indeterminate for a pulmonary nodule or nonspecific area of consolidation. Electronically Signed   By: Donavan Foil M.D.   On: 02/02/2020 20:37   CT ABDOMEN PELVIS W CONTRAST  Result Date: 02/02/2020 CLINICAL DATA:  Abdominal pain fever postop shortness of breath low hemoglobin EXAM: CT CHEST, ABDOMEN, AND PELVIS WITH CONTRAST TECHNIQUE: Multidetector CT imaging of the chest, abdomen and pelvis was performed following the standard protocol during bolus administration of intravenous contrast. CONTRAST:  117mL OMNIPAQUE IOHEXOL 300 MG/ML  SOLN COMPARISON:  Chest x-ray 02/01/2020, PET CT 10/30/2019, CT 09/19/2019, 02/09/2011 FINDINGS: CT CHEST FINDINGS Cardiovascular: Prior stent graft repair of the ascending aorta, aortic valve and aortic root. Moderate aortic atherosclerosis. Proximal arch diameter of 3.7 cm as compared with 4 cm previously. Coronary vascular calcifications. Cardiomegaly. No pericardial effusion. Mediastinum/Nodes: Midline trachea. No thyroid mass. Incompletely visualized left supraclavicular nodes. No significant axillary, mediastinal or hilar adenopathy. Esophagus within normal limits.  Lungs/Pleura: Blebs at the right apex. Calcified right apical lung nodule/granuloma. No pleural effusion or pneumothorax. Subsegmental atelectasis at the right base. Ovoid area of consolidation in the posterior right lower lobe measuring 15 mm. Mild right upper lobe pleural nodularity, not clearly seen on previous exam. Musculoskeletal: Post sternotomy changes. Sclerotic foci now evident within the right sixth rib posteriorly, eighth posterior rib, and seventh lateral rib. Sclerotic focus within the left sixth and eighth lateral ribs. Sclerosis within the posterior T7 vertebral body. CT ABDOMEN PELVIS FINDINGS Hepatobiliary: No focal hepatic abnormality. No biliary dilatation. No calcified gallstone. Coils at the porta hepatis. Pancreas: Unremarkable. No pancreatic ductal dilatation or surrounding inflammatory changes. Spleen: Lobulated morphology with multiple splenules. Probable dystrophic calcification or granuloma. Adrenals/Urinary Tract: Adrenal glands are within normal limits. Cortical scarring within the mid pole of the right kidney and mid to upper pole left kidney. Exophytic cyst upper pole right kidney. 2.7 cm intermediate exophytic density off the upper pole right kidney noted to be PET negative and presumably representing hemorrhagic or proteinaceous cyst. Diffuse irregular bladder wall thickening with right-sided predominance. This is increased compared to prior. Dominant masslike region of thickening involving the right bladder measures approximately 7 x 4.3 cm, series 2, image number 95. Multiple nodules or irregular foci of bladder wall thickening posteriorly. Bilateral hydronephrosis and hydroureter right greater than left and increased in the interval. No ureteral stones. Stomach/Bowel: Stomach is nonenlarged. No dilated small bowel. No bowel wall thickening. Vascular/Lymphatic: Extensive aortic atherosclerosis with aortic tortuosity. No aneurysm. Increased abdominopelvic lymphadenopathy. Right  retrocrural node measures 9 mm compared with 6 mm previously, series 2, image number 54. Aortocaval lymph node measures 9 mm compared with 8 mm previously, series 2, image number 69. Left para aortic lymph node measures 10 mm compared with 7 mm previously, series 2, image number 69. Additional left para aortic lymph node measuring 13 mm, series 2, image number 63. Left pelvic sidewall lymph node measures 14 mm, compared with 7 mm previously, series 2, image number 98. Ext right external iliac node measures 15 mm compared with 12 mm previously, series 2, image number 97. Right common iliac node measuring 24 mm, series 2, image number 91. Reproductive: Prostate is enlarged. Other: Negative for free air. Mild perirectal soft tissue stranding. Diffuse subcutaneous edema. Trace free fluid in the abdomen and pelvis. No free air Musculoskeletal: Intramuscular lipoma within the inferior right gluteus musculature. New faint foci of sclerosis within the left sacrum, series 2, image number 91 and the left iliac bone, series 2, image  number 15. Grade 1 anterolisthesis L4 on L5 with endplate degenerative changes. IMPRESSION: 1. Progression of irregular bladder wall thickening with right-sided predominance suspicious for primary bladder neoplasm. 2. Increased retroperitoneal and pelvic adenopathy, concerning for progression of metastatic disease. 3. Interim development of multiple faint foci of sclerosis involving the ribs, spine and pelvic bones, consistent with skeletal metastatic disease. 4. Progression of bilateral hydronephrosis and hydroureter, presumably obstructed from bladder mass. 5. 2.7 cm intermediate density exophytic to the upper pole of right kidney seen on prior PET-CT and noted to be PET negative at that time and felt to represent hemorrhagic or proteinaceous cyst 6. Small amount of ascites.  Mild diffuse subcutaneous edema 7. 12 mm ovoid area of consolidation in the posterior right lower lobe, indeterminate for  a pulmonary nodule or nonspecific area of consolidation. Electronically Signed   By: Donavan Foil M.D.   On: 02/02/2020 20:37   US Venous Img Lower Unilateral Right  Result Date: 02/13/2020 CLINICAL DATA:  69 year old male with a history swelling EXAM: RIGHT LOWER EXTREMITY VENOUS DOPPLER ULTRASOUND TECHNIQUE: Gray-scale sonography with graded compression, as well as color Doppler and duplex ultrasound were performed to evaluate the lower extremity deep venous systems from the level of the common femoral vein and including the common femoral, femoral, profunda femoral, popliteal and calf veins including the posterior tibial, peroneal and gastrocnemius veins when visible. The superficial great saphenous vein was also interrogated. Spectral Doppler was utilized to evaluate flow at rest and with distal augmentation maneuvers in the common femoral, femoral and popliteal veins. COMPARISON:  None. FINDINGS: Contralateral Common Femoral Vein: Respiratory phasicity is normal and symmetric with the symptomatic side. No evidence of thrombus. Normal compressibility. Common Femoral Vein: No evidence of thrombus. Normal compressibility, respiratory phasicity and response to augmentation. Saphenofemoral Junction: No evidence of thrombus. Normal compressibility and flow on color Doppler imaging. Profunda Femoral Vein: No evidence of thrombus. Normal compressibility and flow on color Doppler imaging. Femoral Vein: No evidence of thrombus. Normal compressibility, respiratory phasicity and response to augmentation. Popliteal Vein: No evidence of thrombus. Normal compressibility, respiratory phasicity and response to augmentation. Calf Veins: No evidence of thrombus. Normal compressibility and flow on color Doppler imaging. Superficial Great Saphenous Vein: No evidence of thrombus. Normal compressibility and flow on color Doppler imaging. Other Findings:  Edema IMPRESSION: Sonographic survey of the right lower extremity negative  for DVT. Edema Electronically Signed   By: Corrie Mckusick D.O.   On: 02/13/2020 13:18   DG Chest Port 1 View  Result Date: 02/01/2020 CLINICAL DATA:  Dyspnea EXAM: PORTABLE CHEST 1 VIEW COMPARISON:  Yesterday FINDINGS: Transcatheter aortic valve replacement. Cardiomegaly is unchanged. Aortic tortuosity. There is no edema, consolidation, effusion, or pneumothorax. Nipple shadow noted on the left. Embolization coils over the right upper quadrant. IMPRESSION: Stable exam.  No evidence of acute disease. Electronically Signed   By: Monte Fantasia M.D.   On: 02/01/2020 09:40   DG Chest Portable 1 View  Result Date: 01/31/2020 CLINICAL DATA:  Shortness of breath. EXAM: PORTABLE CHEST 1 VIEW COMPARISON:  August 09, 2013. FINDINGS: Stable cardiomegaly. Status post aortic valve repair as well as probable stent graft in ascending thoracic aorta. No pneumothorax or pleural effusion is noted. Lungs are clear. Bony thorax is unremarkable. IMPRESSION: No acute cardiopulmonary abnormality seen. Electronically Signed   By: Marijo Conception M.D.   On: 01/31/2020 11:45   US SCROTUM W/DOPPLER  Result Date: 02/16/2020 CLINICAL DATA:  Scrotal swelling EXAM: SCROTAL ULTRASOUND DOPPLER ULTRASOUND  OF THE TESTICLES TECHNIQUE: Complete ultrasound examination of the testicles, epididymis, and other scrotal structures was performed. Color and spectral Doppler ultrasound were also utilized to evaluate blood flow to the testicles. COMPARISON:  None. FINDINGS: Right testicle Measurements: 4.3 x 2.6 x 2.3 cm. No mass or microlithiasis visualized. Left testicle Measurements: 4.8 x 2.9 x 3 cm. No mass or microlithiasis visualized. Right epididymis:  Normal in size and appearance. Left epididymis:  Normal in size and appearance. Hydrocele: There are moderate-sized bilateral hydroceles, greater on the left. Varicocele:  None visualized. Pulsed Doppler interrogation of both testes demonstrates normal low resistance arterial and venous  waveforms bilaterally. Extensive nonspecific scrotal wall thickening is noted. IMPRESSION: 1. No evidence for testicular torsion. 2. No evidence for epididymitis or orchitis. 3. There is extensive scrotal wall thickening of unknown clinical significance. Correlation with physical exam is recommended. 4. Moderate-sized bilateral hydroceles, left greater than right. Electronically Signed   By: Constance Holster M.D.   On: 02/16/2020 15:00    Labs:  CBC: Recent Labs    02/01/20 0609 02/02/20 0618 02/16/20 1353 02/22/20 1000  WBC 10.3 9.5 10.6* 8.5  HGB 8.7* 8.7* 10.9* 10.0*  HCT 29.1* 29.2* 37.2* 32.9*  PLT 354 357 446* 385    COAGS: Recent Labs    04/13/19 1206 01/31/20 1254 02/22/20 1000  INR 1.0 1.2 1.2    BMP: Recent Labs    02/02/20 0618 02/03/20 0626 02/13/20 1220 02/16/20 1353  NA 132* 130* 129* 131*  K 3.9 3.5 4.1 3.3*  CL 97* 96* 96* 94*  CO2 26 24 24 27   GLUCOSE 92 85 105* 113*  BUN 11 9 15 12   CALCIUM 8.8* 8.3* 8.3* 8.4*  CREATININE 0.99 0.82 0.87 0.99  GFRNONAA >60 >60 >60 >60  GFRAA >60 >60 >60 >60    LIVER FUNCTION TESTS: Recent Labs    01/31/20 1149 02/02/20 0618 02/03/20 0626 02/13/20 1220  BILITOT 0.8 1.4* 1.1 1.0  AST 18 19 15 16   ALT 16 15 13 13   ALKPHOS 129* 138* 121 138*  PROT 6.6 6.8 6.0* 6.8  ALBUMIN 3.1* 3.3* 2.9* 3.2*     Assessment and Plan:  History of bladder cancer with tentative plans to begin systemic chemotherapy as management; with associated bilateral ureteral obstruction causing bilateral hydronephrosis. Plan for image-guided Port-a-cath placement along with image-guided bilateral percutaneous nephrostomy tube placements today in IR. Patient is NPO. Afebrile. Plavix held per IR protocol. INR 1.2 today.  Risks and benefits of image-guided Port-a-catheter placement were discussed with the patient including, but not limited to bleeding, infection, pneumothorax, or fibrin sheath development and need for additional  procedures. All of the patient's questions were answered, patient is agreeable to proceed. Consent signed and in chart.  Risks and benefits of bilateral percutaneous nephrostomy tube placement were discussed with the patient including, but not limited to, infection, bleeding, significant bleeding causing loss or decrease in renal function or damage to adjacent structures. All of the patient's questions were answered, patient is agreeable to proceed. Consent signed and in chart.   Thank you for this interesting consult.  I greatly enjoyed meeting [name] and look forward to participating in their care.  A copy of this report was sent to the requesting provider on this date.  Electronically Signed: Earley Abide, PA-C 02/22/2020, 10:55 AM   I spent a total of 40 Minutes in face to face in clinical consultation, greater than 50% of which was counseling/coordinating care for bladder cancer with associated  bilateral ureteral obstruction/Port-a-cath placement and bilateral nephrostomy tube placements.

## 2020-02-22 NOTE — Procedures (Signed)
Interventional Radiology Procedure:   Indications: Bladder cancer  Procedure: Port placement and bilateral nephrostomy tube placement  Findings: Right jugular port, tip at SVC/RA junction.  Bilateral 10 Fr nephrostomy tubes placed.  Complications: None     EBL: Minimal, less than 10 ml  Plan: Discharge in hours hour.  Keep port site and incisions dry for at least 24 hours.     Tequila Rottmann R. Anselm Pancoast, MD  Pager: [phone]

## 2020-02-22 NOTE — Progress Notes (Signed)
Pt states he was unaware he needed supervision after procedure and has "driver."  After further discussion, writer was able to contact brother-n-law, Alto [place] (who lives right next door to  Pt) and is able to take discharge instructions and be with / check on Pt frequently.  Explained risks of having anesthesia (fall, need to not be left alone, etc) and Mr Chapman Fitch verbalizes understanding and he is willing to accept responsibility.

## 2020-02-23 ENCOUNTER — Telehealth: Payer: Self-pay | Admitting: Urology

## 2020-02-23 ENCOUNTER — Other Ambulatory Visit (HOSPITAL_COMMUNITY): Payer: Self-pay | Admitting: Hematology

## 2020-02-23 ENCOUNTER — Encounter (HOSPITAL_COMMUNITY): Payer: Self-pay

## 2020-02-23 ENCOUNTER — Encounter (HOSPITAL_COMMUNITY): Payer: Self-pay | Admitting: Hematology

## 2020-02-23 DIAGNOSIS — Z95828 Presence of other vascular implants and grafts: Secondary | ICD-10-CM

## 2020-02-23 HISTORY — DX: Presence of other vascular implants and grafts: Z95.828

## 2020-02-23 MED ORDER — PROCHLORPERAZINE MALEATE 10 MG PO TABS: 10.0000 mg | ORAL_TABLET | Freq: Four times a day (QID) | ORAL | 1 refills | Status: DC | PRN

## 2020-02-23 MED ORDER — LIDOCAINE-PRILOCAINE 2.5-2.5 % EX CREA: TOPICAL_CREAM | CUTANEOUS | 3 refills | Status: DC

## 2020-02-23 NOTE — Progress Notes (Signed)
.   Pharmacist Chemotherapy Monitoring - Initial Assessment    Anticipated start date: 02/26/20   Regimen:  . Are orders appropriate based on the patient's diagnosis, regimen, and cycle? Yes . Does the plan date match the patient's scheduled date? Yes . Is the sequencing of drugs appropriate? Yes . Are the premedications appropriate for the patient's regimen? Yes . Prior Authorization for treatment is: Approved o If applicable, is the correct biosimilar selected based on the patient's insurance? not applicable  Organ Function and Labs: [name] Are dose adjustments needed based on the patient's renal function, hepatic function, or hematologic function? No . Are appropriate labs ordered prior to the start of patient's treatment? Yes . Other organ system assessment, if indicated: N/A . The following baseline labs, if indicated, have been ordered: N/A  Dose Assessment: . Are the drug doses appropriate? Yes . Are the following correct: o Drug concentrations Yes o IV fluid compatible with drug Yes o Administration routes Yes o Timing of therapy Yes . If applicable, does the patient have documented access for treatment and/or plans for port-a-cath placement? yes . If applicable, have lifetime cumulative doses been properly documented and assessed? no Lifetime Dose Tracking  No doses have been documented on this patient for the following tracked chemicals: Doxorubicin, Epirubicin, Idarubicin, Daunorubicin, Mitoxantrone, Bleomycin, Oxaliplatin, Carboplatin, Liposomal Doxorubicin  o   Toxicity Monitoring/Prevention: . The patient has the following take home antiemetics prescribed: Prochlorperazine . The patient has the following take home medications prescribed: N/A . Medication allergies and previous infusion related reactions, if applicable, have been reviewed and addressed. Yes . The patient's current medication list has been assessed for drug-drug interactions with their chemotherapy regimen.  no significant drug-drug interactions were identified on review.  Order Review: . Are the treatment plan orders signed? No . Is the patient scheduled to see a provider prior to their treatment? Yes  I verify that I have reviewed each item in the above checklist and answered each question accordingly.  Wynona Neat 02/23/2020 12:17 PM

## 2020-02-23 NOTE — Telephone Encounter (Signed)
Do you have any instructions post IR nephrostomy

## 2020-02-23 NOTE — Progress Notes (Signed)
START ON PATHWAY REGIMEN - Bladder     A cycle is every 21 days:     Carboplatin      Gemcitabine   **Always confirm dose/schedule in your pharmacy ordering system**  Patient Characteristics: Advanced/Metastatic Disease, First Line, No Prior Platinum-Based Therapy, Poor Renal Function (CrCl < 50 mL/min), Unknown PD-L1 Expression Therapeutic Status: Advanced/Metastatic Disease Line of Therapy: First Line Prior Platinum-Based Therapy<= No Renal Function: Poor Renal Function (CrCl < 50 mL/min) PD-L1 Expression Status: Unknown PD-L1 Expression Intent of Therapy: Non-Curative / Palliative Intent, Discussed with Patient 

## 2020-02-23 NOTE — Patient Instructions (Signed)
[facility] Chemotherapy Teaching   You are diagnosed with metastatic (Stage IV) urothelial carcinoma (cancer).  You will be treated in the clinic two weeks in a row (Days 1, 8) and then have week off before you have to return to the clinic for your next cycle of treatment.  This schedule will be repeated every 3 weeks (21 days). The chemotherapy drugs you will receive in the clinic are carboplatin and gemcitabine (Gemzar).  You will receive both drugs on Day 1 of each treatment, and Gemzar only on Day 8 of each treatment.  The intent of treatment is to control your cancer, prevent it from spreading further, and to help alleviate any symptoms you may be having related to your disease.  You will see the doctor regularly throughout treatment.  We will obtain blood work from you prior to every treatment and monitor your results to make sure it is safe to give your treatment. The doctor monitors your response to treatment by the way you are feeling, your blood work, and by obtaining scans periodically.  There will be wait times while you are here for treatment.  It will take about 30 minutes to 1 hour for your lab work to result.  Then there will be wait times while pharmacy mixes your medications.   Medications you will receive in the clinic prior to your chemotherapy medications:  Aloxi:  ALOXI is used in adults to help prevent nausea and vomiting that happens with certain chemotherapy drugs.  Aloxi is a long acting medication, and will remain in your system for about two days.   Emend:  This is an anti-nausea medication that is used with Aloxi to help prevent nausea and vomiting caused by chemotherapy.  Dexamethasone:  This is a steroid given prior to chemotherapy to help prevent allergic reactions; it may also help prevent and control nausea and diarrhea.    Carboplatin (Generic Name) Other Names: Paraplatin, CBDCA  About This Drug Carboplatin is a drug used to treat cancer. This  drug is given in the vein (IV).  Takes 30 minutes to infuse.   Possible Side Effects (More Common) . Nausea and throwing up (vomiting). These symptoms may happen within a few hours after your treatment and may last up to 24 hours. Medicines are available to stop or lessen these side effects.  . Bone marrow depression. This is a decrease in the number of white blood cells, red blood cells, and platelets. This may raise your risk of infection, make you tired and weak (fatigue), and raise your risk of bleeding.  . Soreness of the mouth and throat. You may have red areas, white patches, or sores that hurt.  . This drug may affect how your kidneys work. Your kidney function will be checked as needed.  . Electrolyte changes. Your blood will be checked for electrolyte changes as needed.  Possible Side Effects (Less Common)  . Hair loss. Some patients lose their hair on the scalp and body. You may notice your hair thinning seven to 14 days after getting this drug.  . Effects on the nerves are called peripheral neuropathy. You may feel numbness, tingling, or pain in your hands and feet. It may be hard for you to button your clothes, open jars, or walk as usual. The effect on the nerves may get worse with more doses of the drug. These effects get better in some people after the drug is stopped but it does not get better in all people.  [name]  Loose bowel movements (diarrhea) that may last for several days  . Decreased hearing or ringing in the ears  . Changes in the way food and drinks taste  . Changes in liver function. Your liver function will be checked as needed  Allergic Reactions Serious allergic reactions including anaphylaxis are rare. While you are getting this drug in your vein (IV), tell your nurse right away if you have any of these symptoms of an allergic reaction:  . Trouble catching your breath  . Feeling like your tongue or throat are swelling  . Feeling your heart beat quickly or  in a not normal way (palpitations)  . Feeling dizzy or lightheaded  . Flushing, itching, rash, and/or hives  Treating Side Effects  . Drink 6-8 cups of fluids each day unless your doctor has told you to limit your fluid intake due to some other health problem. A cup is 8 ounces of fluid. If you throw up or have loose bowel movements, you should drink more fluids so that you do not become dehydrated (lack water in the body from losing too much fluid).  . Mouth care is very important. Your mouth care should consist of routine, gentle cleaning of your teeth or dentures and rinsing your mouth with a mixture of 1/2 teaspoon of salt in 8 ounces of water or  teaspoon of baking soda in 8 ounces of water. This should be done at least after each meal and at bedtime.  . If you have mouth sores, avoid mouthwash that has alcohol. Avoid alcohol and smoking because they can bother your mouth and throat.  . If you have numbness and tingling in your hands and feet, be careful when cooking, walking, and handling sharp objects and hot liquids.  . Talk with your nurse about getting a wig before you lose your hair. Also, call the [place] at 800-ACS-2345 to find out information about the "Look Good, Feel Better" program close to where you live. It is a free program where women getting chemotherapy can learn about wigs, turbans and scarves as well as makeup techniques and skin and nail care.  Food and Drug Interactions There are no known interactions of carboplatin with food. This drug may interact with other medicines. Tell your doctor and pharmacist about all the medicines and dietary supplements (vitamins, minerals, herbs and others) that you are taking at this time. The safety and use of dietary supplements and alternative diets are often not known. Using these might affect your cancer or interfere with your treatment. Until more is known, you should not use dietary supplements or alternative diets  without your cancer doctor's help.  When to Call the Doctor Call your doctor or nurse right away if you have any of these symptoms:  . Fever of 100.4 F (38 C) or above; chills  . Bleeding or bruising that is not normal  . Wheezing or trouble breathing  . Nausea that stops you from eating or drinking  . Vomiting  . Rash or itching  . Loose bowel movements (diarrhea) more than four times a day or diarrhea with weakness or feeling lightheaded  Call your doctor or nurse as soon as possible if any of these symptoms happen:  . Numbness, tingling, decreased feeling or weakness in fingers, toes, arms, or legs  . Change in hearing, ringing in the ears  . Blurred vision or other changes in eyesight  . Decreased urine  . Yellowing of skin or eyes  Sexual Problems  and Reproductive Concerns Sexual problems and reproduction concerns may happen. In both men and women, this drug may affect your ability to have children. This cannot be determined before your treatment. Talk with your doctor or nurse if you plan to have children. Ask for information on sperm or egg banking. In men, this drug may interfere with your ability to make sperm, but it should not change your ability to have sexual relations. In women, menstrual bleeding may become irregular or stop while you are getting this drug. Do not assume that you cannot become pregnant if you do not have a menstrual period. Women may go through signs of menopause (change of life) like vaginal dryness or itching. Vaginal lubricants can be used to lessen vaginal dryness, itching, and pain during sexual relations. Genetic counseling is available for you to talk about the effects of this drug therapy on future pregnancies. Also, a genetic counselor can look at the possible risk of problems in the unborn baby due to this medicine if an exposure happens during pregnancy.  . Pregnancy warning: This drug may have harmful effects on the unborn child, so  effective methods of birth control should be used during your cancer treatment.  . Breast feeding warning: It is not known if this drug passes into breast milk. For this reason, women should talk to their doctor about the risks and benefits of breast feeding during treatment with this drug because this drug may enter the breast milk and badly harm a breast feeding baby.   Gemcitabine (Gemzar)  About This Drug Gemcitabine is used to treat cancer. It is given in the vein (IV).  It will take about 30 minutes to infuse.  Possible Side Effects . Bone marrow suppression. This is a decrease in the number of white blood cells, red blood cells, and platelets. This may raise your risk of infection, make you tired and weak (fatigue), and raise your risk of bleeding  . Fever  . Trouble breathing  . Nausea and throwing up (vomiting)  . Changes in your liver function  . Increased protein in your urine, which can affect how your kidneys work  . Blood in your urine  . Rash  . Swelling of your legs, ankles and/or feet  Note: Each of the side effects above was reported in 20% or greater of patients treated with Gemcitabine. Not all possible side effects are included above.  Warnings and Precautions . Severe bone marrow suppression  . Inflammation (swelling) of the lungs and/or thickening of the lung tissues, which may be lifethreatening. You may have a dry cough or trouble breathing.  . Changes in your kidney function, which can cause kidney failure  . Changes in your liver function, which can cause liver failure and may be life-threatening  . If you have received radiation treatments, your skin may become red and/or you may develop soreness of the mouth and throat after gemcitabine. This reaction is called "recall." Your body is recalling, or remembering, that it had radiation therapy.  . A syndrome where fluid from your veins can leak into your tissues and cause a decrease in your blood  pressure and fluid to accumulate in your tissues and/or lungs.  . A syndrome can occur that causes changes to kidney and liver function in combination with a decrease in red blood cells. Kidney failure may result which may be life-threatening.  . Changes in your central nervous system can happen. The central nervous system is made up of your brain  and spinal cord. You could feel extreme tiredness, agitation, confusion, hallucinations (see or hear things that are not there), have trouble understanding or speaking, loss of control of your bowels or bladder, eyesight changes, numbness or lack of strength to your arms, legs, face, or body, seizures or coma. If you start to have any of these symptoms let your doctor know right away.  Note: Some of the side effects above are very rare. If you have concerns and/or questions, please discuss them with your medical team.  Important Information . This drug may be present in the saliva, tears, sweat, urine, stool, vomit, semen, and vaginal secretions. Talk to your doctor and/or your nurse about the necessary precautions to take during this time.  Treating Side Effects . Manage tiredness by pacing your activities for the day.  . Be sure to include periods of rest between energy-draining activities.  . To decrease the risk of infection, wash your hands regularly.  . Avoid close contact with people who have a cold, the flu, or other infections.  . Take your temperature as your doctor or nurse tells you, and whenever you feel like you may have a fever.  . To help decrease bleeding, use a soft toothbrush. Check with your nurse before using dental floss.  . Be very careful when using knives or tools.  . Use an electric shaver instead of a razor.  . Drink plenty of fluids (a minimum of eight glasses per day is recommended).  . If you throw up or have loose bowel movements, you should drink more fluids so that you do not become dehydrated (lack of water in  the body from losing too much fluid).  . To help with nausea and vomiting, eat small, frequent meals instead of three large meals a day. Choose foods and drinks that are at room temperature. Ask your nurse or doctor about other helpful tips and medicine that is available to help stop or lessen these symptoms.  . If you get a rash do not put anything on it unless your doctor or nurse says you may. Keep the area around the rash clean and dry. Ask your doctor for medicine if your rash bothers you.  . If you received radiation, and your skin becomes red or irritated again, or you develop soreness of the mouth and throat, follow the same care instructions you did during radiation treatment. Be sure to tell the nurse or doctor administering your chemotherapy about your skin changes.  Food and Drug Interactions . There are no known interactions of gemcitabine with food.  . This drug may interact with other medicines. Tell your doctor and pharmacist about all the prescription and over-the-counter medicines and dietary supplements (vitamins, minerals, herbs and others) that you are taking at this time. Also, check with your doctor or pharmacist before starting any new prescription or over-the-counter medicines, or dietary supplements to make sure that there are no interactions.  When to Call the Doctor Call your doctor or nurse if you have any of these symptoms and/or any new or unusual symptoms:  . Fever of 100.4 F (38 C) or higher  . Chills  . Tiredness that interferes with your daily activities  . Feeling dizzy or lightheaded  . Pain in your chest  . Dry cough  . Wheezing and/or trouble breathing  . Confusion and/or agitation  . Symptoms of a seizure such as confusion, blacking out, passing out, loss of hearing or vision, blurred vision, unusual smells or tastes (  such as burning rubber), trouble talking, tremors or shaking in parts or all of the body, repeated body movements, tense  muscles that do not relax, and loss of control of urine and bowels. If you or your family member suspects you are having a seizure, call 911 right away.  . Hallucinations  . Trouble understanding or speaking  . Blurry vision or changes in your eyesight  . Numbness or lack of strength to your arms, legs, face, or body  . Easy bleeding or bruising  . Nausea that stops you from eating or drinking and/or is not relieved by prescribed medicines  . Throwing up   . Swelling of legs, ankles, or feet  . Weight gain of 5 pounds in one week (fluid retention)  . Blood in urine  . Decreased urine or very dark urine  . Foamy or bubbly-looking urine  . A new rash/itching or a rash that is not relieved by prescribed medicines  . Signs of possible liver problems: dark urine, pale bowel movements, bad stomach pain, feeling very tired and weak, unusual itching, or yellowing of the eyes or skin  . If you think you may be pregnant or may have impregnated your partner  Reproduction Warnings  . Pregnancy warning: This drug can have harmful effects on the unborn baby. Women of childbearing potential should use effective methods of birth control during your cancer treatment and for 6 months after treatment. Men with male partners of childbearing potential should use effective methods of birth control during your cancer treatment and for 3 months after your cancer treatment. Let your doctor know right away if you think you may be pregnant or may have impregnated your partner.  . Breastfeeding warning: Women should not breastfeed during treatment and for 1 week after treatment because this drug could enter the breast milk and cause harm to a breastfeeding baby.  . Fertility warning: In men, this drug may affect your ability to have children in the future. Talk with your doctor or nurse if you plan to have children. Ask for information on sperm banking.   SELF CARE ACTIVITIES WHILE RECEIVING  CHEMOTHERAPY:  Hydration Increase your fluid intake 48 hours prior to treatment and drink at least 8 to 12 cups (64 ounces) of water/decaffeinated beverages per day after treatment. You can still have your cup of coffee or soda but these beverages do not count as part of your 8 to 12 cups that you need to drink daily. No alcohol intake.  Medications Continue taking your normal prescription medication as prescribed.  If you start any new herbal or new supplements please let us know first to make sure it is safe.  Mouth Care Have teeth cleaned professionally before starting treatment. Keep dentures and partial plates clean. Use soft toothbrush and do not use mouthwashes that contain alcohol. Biotene is a good mouthwash that is available at most pharmacies or may be ordered by calling [phone]. Use warm salt water gargles (1 teaspoon salt per 1 quart warm water) before and after meals and at bedtime. If you need dental work, please let the doctor know before you go for your appointment so that we can coordinate the best possible time for you in regards to your chemo regimen. You need to also let your dentist know that you are actively taking chemo. We may need to do labs prior to your dental appointment.  Skin Care Always use sunscreen that has not expired and with SPF Nancy Fetter Protection Factor) of  50 or higher. Wear hats to protect your head from the sun. Remember to use sunscreen on your hands, ears, face, & feet.  Use good moisturizing lotions such as udder cream, eucerin, or even Vaseline. Some chemotherapies can cause dry skin, color changes in your skin and nails.    . Avoid long, hot showers or baths. . Use gentle, fragrance-free soaps and laundry detergent. . Use moisturizers, preferably creams or ointments rather than lotions because the thicker consistency is better at preventing skin dehydration. Apply the cream or ointment within 15 minutes of showering. Reapply moisturizer at night, and  moisturize your hands every time after you wash them.  Hair Loss (if your doctor says your hair will fall out)  . If your doctor says that your hair is likely to fall out, decide before you begin chemo whether you want to wear a wig. You may want to shop before treatment to match your hair color. . Hats, turbans, and scarves can also camouflage hair loss, although some people prefer to leave their heads uncovered. If you go bare-headed outdoors, be sure to use sunscreen on your scalp. . Cut your hair short. It eases the inconvenience of shedding lots of hair, but it also can reduce the emotional impact of watching your hair fall out. . Don't perm or color your hair during chemotherapy. Those chemical treatments are already damaging to hair and can enhance hair loss. Once your chemo treatments are done and your hair has grown back, it's OK to resume dyeing or perming hair.  With chemotherapy, hair loss is almost always temporary. But when it grows back, it may be a different color or texture. In older adults who still had hair color before chemotherapy, the new growth may be completely gray.  Often, new hair is very fine and soft.  Infection Prevention Please wash your hands for at least 30 seconds using warm soapy water. Handwashing is the #1 way to prevent the spread of germs. Stay away from sick people or people who are getting over a cold. If you develop respiratory systems such as green/yellow mucus production or productive cough or persistent cough let us know and we will see if you need an antibiotic. It is a good idea to keep a pair of gloves on when going into grocery stores/[employer] to decrease your risk of coming into contact with germs on the carts, etc. Carry alcohol hand gel with you at all times and use it frequently if out in public. If your temperature reaches 100.4 or higher please call the clinic and let us know.  If it is after hours or on the weekend please go to the ER if your  temperature is over 100.5.  Please have your own personal thermometer at home to use.    Sex and bodily fluids If you are going to have sex, a condom must be used to protect the person that isn't taking chemotherapy. Chemo can decrease your libido (sex drive). For a few days after chemotherapy, chemotherapy can be excreted through your bodily fluids.  When using the toilet please close the lid and flush the toilet twice.  Do this for a few day after you have had chemotherapy.   Effects of chemotherapy on your sex life Some changes are simple and won't last long. They won't affect your sex life permanently.  Sometimes you may feel: . too tired . not strong enough to be very active . sick or sore  . not in the mood .  anxious or low  Your anxiety might not seem related to sex. For example, you may be worried about the cancer and how your treatment is going. Or you may be worried about money, or about how you family are coping with your illness.  These things can cause stress, which can affect your interest in sex. It's important to talk to your partner about how you feel. Remember - the changes to your sex life don't usually last long. There's usually no medical reason to stop having sex during chemo. The drugs won't have any long term physical effects on your performance or enjoyment of sex. Cancer can't be passed on to your partner during sex  Contraception It's important to use reliable contraception during treatment. Avoid getting pregnant while you or your partner are having chemotherapy. This is because the drugs may harm the baby. Sometimes chemotherapy drugs can leave a man or woman infertile.  This means you would not be able to have children in the future. You might want to talk to someone about permanent infertility. It can be very difficult to learn that you may no longer be able to have children. Some people find counselling helpful.  There might be ways to preserve your fertility,  although this is easier for men than for women. You may want to speak to a fertility expert. You can talk about sperm banking or harvesting your eggs. You can also ask about other fertility options, such as donor eggs.  If you have or have had breast cancer, your doctor might advise you not to take the contraceptive pill. This is because the hormones in it might affect the cancer.  It is not known for sure whether or not chemotherapy drugs can be passed on through semen or secretions from the vagina. Because of this some doctors advise people to use a barrier method if you have sex during treatment. This applies to vaginal, anal or oral sex.  Generally, doctors advise a barrier method only for the time you are actually having the treatment and for about a week after your treatment.  Advice like this can be worrying, but this does not mean that you have to avoid being intimate with your partner. You can still have close contact with your partner and continue to enjoy sex.  Animals If you have cats or birds we just ask that you not change the litter or change the cage.  Please have someone else do this for you while you are on chemotherapy.   Food Safety During and After Cancer Treatment Food safety is important for people both during and after cancer treatment. Cancer and cancer treatments, such as chemotherapy, radiation therapy, and stem cell/bone marrow transplantation, often weaken the immune system. This makes it harder for your body to protect itself from foodborne illness, also called food poisoning. Foodborne illness is caused by eating food that contains harmful bacteria, parasites, or viruses.  Foods to avoid Some foods have a higher risk of becoming tainted with bacteria. These include: [name] Unwashed fresh fruit and vegetables, especially leafy vegetables that can hide dirt and other contaminants . Raw sprouts, such as alfalfa sprouts . Raw or undercooked beef, especially ground beef, or other  raw or undercooked meat and poultry . Fatty, fried, or spicy foods immediately before or after treatment.  These can sit heavy on your stomach and make you feel nauseous. . Raw or undercooked shellfish, such as oysters. . Sushi and sashimi, which often contain raw fish.  [name]  Unpasteurized beverages, such as unpasteurized fruit juices, raw milk, raw yogurt, or cider . Undercooked eggs, such as soft boiled, over easy, and poached; raw, unpasteurized eggs; or foods made with raw egg, such as homemade raw cookie dough and homemade mayonnaise  Simple steps for food safety  Shop smart. . Do not buy food stored or displayed in an unclean area. . Do not buy bruised or damaged fruits or vegetables. . Do not buy cans that have cracks, dents, or bulges. . Pick up foods that can spoil at the end of your shopping trip and store them in a cooler on the way home.  Prepare and clean up foods carefully. . Rinse all fresh fruits and vegetables under running water, and dry them with a clean towel or paper towel. . Clean the top of cans before opening them. . After preparing food, wash your hands for 20 seconds with hot water and soap. Pay special attention to areas between fingers and under nails. . Clean your utensils and dishes with hot water and soap. [name] Disinfect your kitchen and cutting boards using 1 teaspoon of liquid, unscented bleach mixed into 1 quart of water.    Dispose of old food. . Eat canned and packaged food before its expiration date (the "use by" or "best before" date). . Consume refrigerated leftovers within 3 to 4 days. After that time, throw out the food. Even if the food does not smell or look spoiled, it still may be unsafe. Some bacteria, such as Listeria, can grow even on foods stored in the refrigerator if they are kept for too long.  Take precautions when eating out. . At restaurants, avoid buffets and salad bars where food sits out for a long time and comes in contact with many  people. Food can become contaminated when someone with a virus, often a norovirus, or another "bug" handles it. . Put any leftover food in a "to-go" container yourself, rather than having the server do it. And, refrigerate leftovers as soon as you get home. . Choose restaurants that are clean and that are willing to prepare your food as you order it cooked.   AT HOME MEDICATIONS:                                                                                                                                                                Compazine/Prochlorperazine 10mg  tablet. Take 1 tablet every 6 hours as needed for nausea/vomiting. (This can make you sleepy)   EMLA cream. Apply a quarter size amount to port site 1 hour prior to chemo. Do not rub in. Cover with plastic wrap.    Diarrhea Sheet   If you are having loose stools/diarrhea, please purchase Imodium and begin taking as outlined:  At the first  sign of poorly formed or loose stools you should begin taking Imodium (loperamide) 2 mg capsules.  Take two tablets (4mg ) followed by one tablet (2mg ) every 2 hours - DO NOT EXCEED 8 tablets in 24 hours.  If it is bedtime and you are having loose stools, take 2 tablets at bedtime, then 2 tablets every 4 hours until morning.   Always call the [facility] if you are having loose stools/diarrhea that you can't get under control.  Loose stools/diarrhea leads to dehydration (loss of water) in your body.  We have other options of trying to get the loose stools/diarrhea to stop but you must let us know!   Constipation Sheet  Colace - 100 mg capsules - take 2 capsules daily.  If this doesn't help then you can increase to 2 capsules twice daily.  Please call if the above does not work for you. Do not go more than 2 days without a bowel movement.  It is very important that you do not become constipated.  It will make you feel sick to your stomach (nausea) and can cause abdominal pain and  vomiting.  Nausea Sheet   Compazine/Prochlorperazine 10mg  tablet. Take 1 tablet every 6 hours as needed for nausea/vomiting (This can make you drowsy).  If you are having persistent nausea (nausea that does not stop) please call the [facility] and let us know the amount of nausea that you are experiencing.  If you begin to vomit, you need to call the [facility] and if it is the weekend and you have vomited more than one time and can't get it to stop, go to the Emergency Room.  Persistent nausea/vomiting can lead to dehydration (loss of fluid in your body) and will make you feel very weak and unwell. Ice chips, sips of clear liquids, foods that are at room temperature, crackers, and toast tend to be better tolerated.   SYMPTOMS TO REPORT AS SOON AS POSSIBLE AFTER TREATMENT:  FEVER GREATER THAN 100.4 F  CHILLS WITH OR WITHOUT FEVER  NAUSEA AND VOMITING THAT IS NOT CONTROLLED WITH YOUR NAUSEA MEDICATION  UNUSUAL SHORTNESS OF BREATH  UNUSUAL BRUISING OR BLEEDING  TENDERNESS IN MOUTH AND THROAT WITH OR WITHOUT   PRESENCE OF ULCERS  URINARY PROBLEMS  BOWEL PROBLEMS  UNUSUAL RASH    Wear comfortable clothing and clothing appropriate for easy access to any Portacath or PICC line. Let us know if there is anything that we can do to make your therapy better!   What to do if you need assistance after hours or on the weekends: CALL [phone].  HOLD on the line, do not hang up.  You will hear multiple messages but at the end you will be connected with a nurse triage line.  They will contact the doctor if necessary.  Most of the time they will be able to assist you.  Do not call the hospital operator.     I have been informed and understand all of the instructions given to me and have received a copy. I have been instructed to call the clinic [phone] or my family physician as soon as possible for continued medical care, if indicated. I do not have any more questions at this  time but understand that I may call the [facility] or the Patient Navigator at [phone] during office hours should I have questions or need assistance in obtaining follow-up care.

## 2020-02-23 NOTE — Telephone Encounter (Signed)
Kim @ [facility] called yesterday and LM that pt was having a procedure yesterday and they wanted to know if there were any instructions for the pt. I received this msg this morning.

## 2020-02-26 ENCOUNTER — Ambulatory Visit (HOSPITAL_COMMUNITY): Payer: Medicare Other

## 2020-02-26 ENCOUNTER — Other Ambulatory Visit (HOSPITAL_COMMUNITY): Payer: Medicare Other

## 2020-02-26 ENCOUNTER — Inpatient Hospital Stay (HOSPITAL_COMMUNITY): Payer: Medicare Other

## 2020-02-26 ENCOUNTER — Ambulatory Visit (HOSPITAL_COMMUNITY): Payer: Medicare Other | Admitting: Hematology

## 2020-02-26 DIAGNOSIS — C7910 Secondary malignant neoplasm of unspecified urinary organs: Secondary | ICD-10-CM

## 2020-02-26 DIAGNOSIS — Z95828 Presence of other vascular implants and grafts: Secondary | ICD-10-CM

## 2020-02-27 ENCOUNTER — Other Ambulatory Visit (HOSPITAL_COMMUNITY): Payer: Medicare Other | Admitting: General Practice

## 2020-02-28 ENCOUNTER — Inpatient Hospital Stay: Payer: Medicare Other | Admitting: Family Medicine

## 2020-02-29 DIAGNOSIS — C7910 Secondary malignant neoplasm of unspecified urinary organs: Secondary | ICD-10-CM | POA: Diagnosis not present

## 2020-02-29 DIAGNOSIS — C679 Malignant neoplasm of bladder, unspecified: Secondary | ICD-10-CM | POA: Diagnosis not present

## 2020-03-03 NOTE — Progress Notes (Deleted)
Cardiology Office Note  Date: 03/03/2020   ID: Merland, Holness [birth year], MRN [id number]  PCP:  [name], MD  Cardiologist:  Rozann Lesches, MD Electrophysiologist:  None   Chief Complaint: Follow-up aortic valve disease, CAD, secondary cardiomyopathy  History of Present Illness: GIOVONNIE Zhang is a 69 y.o. male with a history of aortic valve disease (bioprosthetic valve), CAD, secondary cardiomyopathy.  Last encounter with Dr. Domenic Polite 01/11/2020.  He did not report any changes in shortness of breath at baseline currently with NYHA class II symptoms.  No active angina symptoms or palpitations.  He was having no bleeding problems on aspirin and Plavix.  He was to continue his Toprol-XL, lisinopril and Lasix.  He was status post bioprosthetic AVR ultimately valve in valve TAVR due to severe aortic regurgitation in setting of torn prosthetic cusp.  Recent echocardiogram showed stable valve gradient with moderate perivalvular regurgitation.  Status post BMS to RCA in 2014 he was continuing on dual antiplatelet therapy and Lipitor.  His last LDL was 57.   Seen in emergency room 02/16/2020 after recent hospital admission on 01/31/2020 for symptomatic anemia he was treated with blood transfusion at the time and followed up with his oncology provider 3 days prior to ER presentation.  He presented to the emergency department complaining of sudden onset of swelling of penis and testicles with associated difficulty urinating and hematuria.  Recently had a Port-A-Cath placed for chemotherapy for bladder cancer with metastatic disease.  He follows with Dr. Donney Rankins oncology.   Past Medical History:  Diagnosis Date  . Arterial embolus of lower extremity ([place]) 11/23/2013  . Arthritis   . CAD (coronary artery disease)    a. s/p PCI with 3.5 x 18 BMS to mid RCA, non obst LAD, Lcx patent 08/10/13  . Cancer ([place]) 2021   bladder  . Depression, major, single episode, mild ([place])  12/20/2019  . Embolus of femoral artery [facility]) 2012   Surgical repair, previously on Coumadin  . Essential hypertension   . ETOH abuse   . History of DVT (deep vein thrombosis) 2011  . History of GI bleed    February 2012   . Ingrown left greater toenail 09/04/2018  . 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 Port-A-Cath in place 02/23/2020  . Prosthetic valve endocarditis ([place])   . PVD (peripheral vascular disease) ([place])   . S/P aortic valve replacement    Details not clear -  reportedly 1971 per patient    Past Surgical History:  Procedure Laterality Date  . ABDOMINAL AORTOGRAM W/LOWER EXTREMITY N/A 10/31/2018   Procedure: ABDOMINAL AORTOGRAM W/LOWER EXTREMITY;  Surgeon: Lorretta Harp, MD;  Location: [facility] CV LAB;  Service: Cardiovascular;  Laterality: N/A;  . Aoric valve replacement  January 2005   36mm Medtronic Freestyle stentless porcine bioprosthesis [facility])  . CARDIAC CATHETERIZATION     30-40% mid LAD, 20-30% ostial D1, proximal 30-40% RCA, 95% mid RCA thrombus, 40% distal stenosis. EF 45%, inferior HK.  [name] COLONOSCOPY N/A 04/20/2014   Procedure: COLONOSCOPY;  Surgeon: Rogene Houston, MD;  [facility];  Service: Endoscopy;  Laterality: N/A;  930  . COLONOSCOPY WITH PROPOFOL N/A 02/02/2020   Procedure: COLONOSCOPY WITH PROPOFOL;  Surgeon: Rogene Houston, MD;  [facility];  Service: Endoscopy;  Laterality: N/A;  . CORONARY  ANGIOPLASTY WITH STENT PLACEMENT  07/2013  . ESOPHAGOGASTRODUODENOSCOPY (EGD) WITH PROPOFOL N/A 02/02/2020   Procedure: ESOPHAGOGASTRODUODENOSCOPY (EGD) WITH PROPOFOL;  Surgeon: Rogene Houston, MD;  [facility];  Service: Endoscopy;  Laterality: N/A;  . INGUINAL HERNIA REPAIR Bilateral 04/17/2019   Procedure: ATTEMPTED LAPAROSCOPIC BILATERAL INGUINAL HERNIA REPAIR;  Surgeon: Coralie Keens, MD;   Location: [facility];  Service: General;  Laterality: Bilateral;  . INGUINAL HERNIA REPAIR Bilateral 04/17/2019   Procedure: Hernia Repair Inguinal Adult;  Surgeon: Coralie Keens, MD;  Location: [facility];  Service: General;  Laterality: Bilateral;  . IR IMAGING GUIDED PORT INSERTION  02/22/2020  . IR NEPHROSTOMY PLACEMENT LEFT  02/22/2020  . IR NEPHROSTOMY PLACEMENT RIGHT  02/22/2020  . LEFT HEART CATHETERIZATION WITH CORONARY ANGIOGRAM N/A 08/10/2013   Procedure: LEFT HEART CATHETERIZATION WITH CORONARY ANGIOGRAM;  Surgeon: Blane Ohara, MD;  Location: [facility] CATH LAB;  Service: Cardiovascular;  Laterality: N/A;  . Left leg embolectomy  May 2012   Dr. Trula Slade - reason for lifelong coumadin  . LYMPH NODE BIOPSY Left 11/21/2019   Procedure: Left Neck LYMPH NODE BIOPSY;  Surgeon: Rozetta Nunnery, MD;  Location: [facility];  Service: ENT;  Laterality: Left;  . PERIPHERAL VASCULAR ATHERECTOMY  10/31/2018   Procedure: PERIPHERAL VASCULAR ATHERECTOMY;  Surgeon: Lorretta Harp, MD;  Location: [facility] CV LAB;  Service: Cardiovascular;;  left SFA  . POLYPECTOMY  02/02/2020   Procedure: POLYPECTOMY;  Surgeon: Rogene Houston, MD;  [facility];  Service: Endoscopy;;  splenic flexure    Current Outpatient Medications  Medication Sig Dispense Refill  . aspirin EC 81 MG tablet Take 1 tablet (81 mg total) by mouth daily. 90 tablet 3  . atorvastatin (LIPITOR) 40 MG tablet Take 1 tablet by mouth once daily 90 tablet 0  . busPIRone (BUSPAR) 10 MG tablet Take 1 tablet (10 mg total) by mouth 2 (two) times daily. 60 tablet 2  . CARBOPLATIN IV Inject into the vein. Days 1, 8 every 21 days    . cholecalciferol (VITAMIN D) 1000 units tablet Take 1 tablet (1,000 Units total) by mouth daily. 90 tablet 3  . clopidogrel (PLAVIX) 75 MG tablet Take 1 tablet by mouth once daily (Patient taking differently: Take 75 mg by mouth daily. ) 90 tablet 0  . escitalopram (LEXAPRO) 10 MG tablet Take 1 tablet (10 mg total) by  mouth daily. 30 tablet 1  . fluticasone (FLONASE) 50 MCG/ACT nasal spray Place 2 sprays into both nostrils daily. 16 g 6  . furosemide (LASIX) 40 MG tablet Take 1 tablet by mouth once daily 90 tablet 0  . GEMCITABINE HCL IV Inject 1,000 mg/m2 into the vein. Days 1, 8 every 21 days    . HYDROcodone-acetaminophen (NORCO/VICODIN) 5-325 MG tablet Take 1 tablet by mouth every 6 (six) hours as needed for moderate pain. 10 tablet 0  . lidocaine-prilocaine (EMLA) cream Apply a small amount to port a cath site and cover with plastic wrap 1 hour prior to chemotherapy appointments 30 g 3  . lisinopril (ZESTRIL) 20 MG tablet Take 1 tablet by mouth once daily 90 tablet 0  . metoprolol succinate (TOPROL-XL) 50 MG 24 hr tablet TAKE 1 TABLET BY MOUTH ONCE DAILY WITH  OR  IMMEDIATELY  FOLLOWING  A  MEAL 90 tablet 0  . montelukast (SINGULAIR) 10 MG tablet TAKE 1 TABLET BY MOUTH AT BEDTIME 90 tablet 0  . nitroGLYCERIN (NITROSTAT) 0.4 MG SL tablet Place 1 tablet (0.4  mg total) under the tongue every 5 (five) minutes as needed for chest pain. 25 tablet 0  . pantoprazole (PROTONIX) 40 MG tablet Take 1 tablet (40 mg total) by mouth daily before breakfast. 30 tablet 2  . prochlorperazine (COMPAZINE) 10 MG tablet Take 1 tablet (10 mg total) by mouth every 6 (six) hours as needed (Nausea or vomiting). 30 tablet 1  . trolamine salicylate (ASPERCREME) 10 % cream Apply 1 application topically as needed for muscle pain.     No current facility-administered medications for this visit.   Allergies:  Patient has no known allergies.   Social History: The patient  reports that he quit smoking about 50 years ago. His smoking use included cigarettes. He started smoking about 54 years ago. He has a 1.50 pack-year smoking history. He has never used smokeless tobacco. He reports previous alcohol use. He reports current drug use. Drug: Marijuana.   Family History: The patient's family history includes COPD in his sister; Cirrhosis in  his father; Diabetes in his mother.   ROS:  Please see the history of present illness. Otherwise, complete review of systems is positive for none.  All other systems are reviewed and negative.   Physical Exam: VS:  There were no vitals taken for this visit., BMI There is no height or weight on file to calculate BMI.  Wt Readings from Last 3 Encounters:  02/21/20 120 lb (54.4 kg)  02/16/20 121 lb (54.9 kg)  02/13/20 121 lb (54.9 kg)    General: Patient appears comfortable at rest. HEENT: Conjunctiva and lids normal, oropharynx clear with moist mucosa. Neck: Supple, no elevated JVP or carotid bruits, no thyromegaly. Lungs: Clear to auscultation, nonlabored breathing at rest. Cardiac: Regular rate and rhythm, no S3 or significant systolic murmur, no pericardial rub. Abdomen: Soft, nontender, no hepatomegaly, bowel sounds present, no guarding or rebound. Extremities: No pitting edema, distal pulses 2+. Skin: Warm and dry. Musculoskeletal: No kyphosis. Neuropsychiatric: Alert and oriented x3, affect grossly appropriate.  ECG:  {EKG/Telemetry Strips [phone]}  Recent Labwork: 09/12/2019: TSH 0.45 02/02/2020: B Natriuretic Peptide 1,226.0 02/03/2020: Magnesium 1.9 02/13/2020: ALT 13; AST 16 02/22/2020: BUN 20; Creatinine, Ser 1.41; Hemoglobin 10.0; Platelets 385; Potassium 4.2; Sodium 128     Component Value Date/Time   CHOL 112 07/06/2019 0843   TRIG 52 07/06/2019 0843   HDL 42 07/06/2019 0843   CHOLHDL 2.7 07/06/2019 0843   VLDL 12 12/30/2016 0853   LDLCALC 57 07/06/2019 0843    Other Studies Reviewed Today:  Echocardiogram 01/05/2020: 1. Left ventricular ejection fraction, by estimation, is 45 to 50%. The  left ventricle has mildly decreased function. The left ventricle  demonstrates regional wall motion abnormalities (see scoring  diagram/findings for description). The left ventricular  internal cavity size was mildly dilated. There is mild left ventricular    hypertrophy. Left ventricular diastolic parameters are consistent with  Grade I diastolic dysfunction (impaired relaxation).  2. Right ventricular systolic function is normal. The right ventricular  size is normal. There is mildly elevated pulmonary artery systolic  pressure. The estimated right ventricular systolic pressure is 81.1 mmHg.  3. Left atrial size was severely dilated.  4. The mitral valve is grossly normal. Trivial mitral valve  regurgitation.  5. The aortic valve has been repaired/replaced. History of bioprosthetic  AVR with valve in valve TAVR, 26 mm Sapien 3 prosthesis. There is a  moderate perivalvular leak at 9 o'clock. Aortic valve regurgitation is  mild. Aortic valve mean gradient measures  13.7  mmHg.  6. The inferior vena cava is normal in size with greater than 50%  respiratory variability, suggesting right atrial pressure of 3 mmHg.   Assessment and Plan:  1. Secondary cardiomyopathy ([place])   2. Aortic valve disease   3. Coronary artery disease involving native coronary artery of native heart with angina pectoris ([place])    1. Secondary cardiomyopathy ([place]) Echo 01/15/2020 EF 45-50%, RWMA's Mild LVH, Grade I DD.  2. Aortic valve disease Hx of AVR with valve in valveTAVR   3. Coronary artery disease involving native coronary artery of native heart with angina pectoris (H[facility]) ***  Medication Adjustments/Labs and Tests Ordered: Current medicines are reviewed at length with the patient today.  Concerns regarding medicines are outlined above.   Disposition: Follow-up with ***  Signed, Levell July, NP 03/03/2020 4:45 PM    [place] at [facility], [place], [place] [address] Phone: [phone]; Fax: [phone]

## 2020-03-04 ENCOUNTER — Other Ambulatory Visit: Payer: Self-pay

## 2020-03-04 ENCOUNTER — Inpatient Hospital Stay (HOSPITAL_BASED_OUTPATIENT_CLINIC_OR_DEPARTMENT_OTHER): Payer: Medicare Other | Admitting: Hematology

## 2020-03-04 ENCOUNTER — Ambulatory Visit: Payer: Medicare Other | Admitting: Family Medicine

## 2020-03-04 ENCOUNTER — Inpatient Hospital Stay (HOSPITAL_COMMUNITY): Payer: Medicare Other

## 2020-03-04 VITALS — BP 146/63 | HR 80 | Temp 97.5°F | Resp 18

## 2020-03-04 VITALS — BP 128/64 | HR 73 | Temp 97.5°F | Resp 18

## 2020-03-04 DIAGNOSIS — Z7902 Long term (current) use of antithrombotics/antiplatelets: Secondary | ICD-10-CM | POA: Diagnosis not present

## 2020-03-04 DIAGNOSIS — I700 Atherosclerosis of aorta: Secondary | ICD-10-CM | POA: Diagnosis not present

## 2020-03-04 DIAGNOSIS — C7911 Secondary malignant neoplasm of bladder: Secondary | ICD-10-CM | POA: Diagnosis not present

## 2020-03-04 DIAGNOSIS — Z955 Presence of coronary angioplasty implant and graft: Secondary | ICD-10-CM | POA: Diagnosis not present

## 2020-03-04 DIAGNOSIS — A4159 Other Gram-negative sepsis: Secondary | ICD-10-CM | POA: Diagnosis not present

## 2020-03-04 DIAGNOSIS — Z87891 Personal history of nicotine dependence: Secondary | ICD-10-CM | POA: Diagnosis not present

## 2020-03-04 DIAGNOSIS — C7910 Secondary malignant neoplasm of unspecified urinary organs: Secondary | ICD-10-CM

## 2020-03-04 DIAGNOSIS — Z7982 Long term (current) use of aspirin: Secondary | ICD-10-CM | POA: Diagnosis not present

## 2020-03-04 DIAGNOSIS — Z20822 Contact with and (suspected) exposure to covid-19: Secondary | ICD-10-CM | POA: Diagnosis not present

## 2020-03-04 DIAGNOSIS — I5043 Acute on chronic combined systolic (congestive) and diastolic (congestive) heart failure: Secondary | ICD-10-CM | POA: Diagnosis not present

## 2020-03-04 DIAGNOSIS — I472 Ventricular tachycardia: Secondary | ICD-10-CM | POA: Diagnosis not present

## 2020-03-04 DIAGNOSIS — I739 Peripheral vascular disease, unspecified: Secondary | ICD-10-CM | POA: Diagnosis not present

## 2020-03-04 DIAGNOSIS — J939 Pneumothorax, unspecified: Secondary | ICD-10-CM | POA: Diagnosis not present

## 2020-03-04 DIAGNOSIS — Z79899 Other long term (current) drug therapy: Secondary | ICD-10-CM | POA: Diagnosis not present

## 2020-03-04 DIAGNOSIS — C7951 Secondary malignant neoplasm of bone: Secondary | ICD-10-CM | POA: Diagnosis not present

## 2020-03-04 DIAGNOSIS — N12 Tubulo-interstitial nephritis, not specified as acute or chronic: Secondary | ICD-10-CM | POA: Diagnosis not present

## 2020-03-04 DIAGNOSIS — D509 Iron deficiency anemia, unspecified: Secondary | ICD-10-CM | POA: Diagnosis not present

## 2020-03-04 DIAGNOSIS — T83512A Infection and inflammatory reaction due to nephrostomy catheter, initial encounter: Secondary | ICD-10-CM | POA: Diagnosis not present

## 2020-03-04 DIAGNOSIS — Z825 Family history of asthma and other chronic lower respiratory diseases: Secondary | ICD-10-CM | POA: Diagnosis not present

## 2020-03-04 DIAGNOSIS — D649 Anemia, unspecified: Secondary | ICD-10-CM | POA: Diagnosis not present

## 2020-03-04 DIAGNOSIS — K2100 Gastro-esophageal reflux disease with esophagitis, without bleeding: Secondary | ICD-10-CM | POA: Diagnosis not present

## 2020-03-04 DIAGNOSIS — Z7401 Bed confinement status: Secondary | ICD-10-CM | POA: Diagnosis not present

## 2020-03-04 DIAGNOSIS — Z86718 Personal history of other venous thrombosis and embolism: Secondary | ICD-10-CM | POA: Diagnosis not present

## 2020-03-04 DIAGNOSIS — C679 Malignant neoplasm of bladder, unspecified: Secondary | ICD-10-CM | POA: Diagnosis not present

## 2020-03-04 DIAGNOSIS — I517 Cardiomegaly: Secondary | ICD-10-CM | POA: Diagnosis not present

## 2020-03-04 DIAGNOSIS — I2510 Atherosclerotic heart disease of native coronary artery without angina pectoris: Secondary | ICD-10-CM | POA: Diagnosis not present

## 2020-03-04 DIAGNOSIS — Z952 Presence of prosthetic heart valve: Secondary | ICD-10-CM | POA: Diagnosis not present

## 2020-03-04 DIAGNOSIS — Z95828 Presence of other vascular implants and grafts: Secondary | ICD-10-CM

## 2020-03-04 DIAGNOSIS — R0602 Shortness of breath: Secondary | ICD-10-CM | POA: Diagnosis not present

## 2020-03-04 DIAGNOSIS — I110 Hypertensive heart disease with heart failure: Secondary | ICD-10-CM | POA: Diagnosis not present

## 2020-03-04 DIAGNOSIS — Z833 Family history of diabetes mellitus: Secondary | ICD-10-CM | POA: Diagnosis not present

## 2020-03-04 LAB — COMPREHENSIVE METABOLIC PANEL WITH GFR
ALT: 13 U/L (ref 0–44)
AST: 16 U/L (ref 15–41)
Albumin: 3 g/dL — ABNORMAL LOW (ref 3.5–5.0)
Alkaline Phosphatase: 169 U/L — ABNORMAL HIGH (ref 38–126)
Anion gap: 10 (ref 5–15)
BUN: 10 mg/dL (ref 8–23)
CO2: 24 mmol/L (ref 22–32)
Calcium: 8.1 mg/dL — ABNORMAL LOW (ref 8.9–10.3)
Chloride: 94 mmol/L — ABNORMAL LOW (ref 98–111)
Creatinine, Ser: 0.82 mg/dL (ref 0.61–1.24)
GFR calc Af Amer: >60 mL/min
GFR calc non Af Amer: >60 mL/min
Glucose, Bld: 92 mg/dL (ref 70–99)
Potassium: 3.9 mmol/L (ref 3.5–5.1)
Sodium: 128 mmol/L — ABNORMAL LOW (ref 135–145)
Total Bilirubin: 0.7 mg/dL (ref 0.3–1.2)
Total Protein: 6.8 g/dL (ref 6.5–8.1)

## 2020-03-04 LAB — CBC WITH DIFFERENTIAL/PLATELET
Abs Immature Granulocytes: 0.04 10*3/uL (ref 0.00–0.07)
Basophils Absolute: 0.1 10*3/uL (ref 0.0–0.1)
Basophils Relative: 1 %
Eosinophils Absolute: 0.3 10*3/uL (ref 0.0–0.5)
Eosinophils Relative: 3 %
HCT: 29.5 % — ABNORMAL LOW (ref 39.0–52.0)
Hemoglobin: 9.2 g/dL — ABNORMAL LOW (ref 13.0–17.0)
Immature Granulocytes: 0 %
Lymphocytes Relative: 11 %
Lymphs Abs: 1.1 10*3/uL (ref 0.7–4.0)
MCH: 22.8 pg — ABNORMAL LOW (ref 26.0–34.0)
MCHC: 31.2 g/dL (ref 30.0–36.0)
MCV: 73.2 fL — ABNORMAL LOW (ref 80.0–100.0)
Monocytes Absolute: 0.9 10*3/uL (ref 0.1–1.0)
Monocytes Relative: 9 %
Neutro Abs: 8 10*3/uL — ABNORMAL HIGH (ref 1.7–7.7)
Neutrophils Relative %: 76 %
Platelets: 430 10*3/uL — ABNORMAL HIGH (ref 150–400)
RBC: 4.03 MIL/uL — ABNORMAL LOW (ref 4.22–5.81)
RDW: 22.5 % — ABNORMAL HIGH (ref 11.5–15.5)
WBC: 10.4 10*3/uL (ref 4.0–10.5)
nRBC: 0 % (ref 0.0–0.2)

## 2020-03-04 LAB — MAGNESIUM: Magnesium: 1.8 mg/dL (ref 1.7–2.4)

## 2020-03-04 MED ORDER — SODIUM CHLORIDE 0.9 % IV SOLN: Freq: Once | INTRAVENOUS | Status: AC

## 2020-03-04 MED ORDER — PALONOSETRON HCL INJECTION 0.25 MG/5ML
0.2500 mg | Freq: Once | INTRAVENOUS | Status: AC
  Administered 2020-03-04: 0.25 mg via INTRAVENOUS
  Filled 2020-03-04: qty 5

## 2020-03-04 MED ORDER — SODIUM CHLORIDE 0.9 % IV SOLN
1000.0000 mg/m2 | Freq: Once | INTRAVENOUS | Status: AC
  Administered 2020-03-04: 1558 mg via INTRAVENOUS
  Filled 2020-03-04: qty 15.78

## 2020-03-04 MED ORDER — SODIUM CHLORIDE 0.9 % IV SOLN
10.0000 mg | Freq: Once | INTRAVENOUS | Status: AC
  Administered 2020-03-04: 10 mg via INTRAVENOUS
  Filled 2020-03-04: qty 10

## 2020-03-04 MED ORDER — SODIUM CHLORIDE 0.9% FLUSH
10.0000 mL | INTRAVENOUS | Status: DC | PRN
  Administered 2020-03-04: 10 mL via INTRAVENOUS

## 2020-03-04 MED ORDER — SODIUM CHLORIDE 0.9 % IV SOLN
150.0000 mg | Freq: Once | INTRAVENOUS | Status: AC
  Administered 2020-03-04: 150 mg via INTRAVENOUS
  Filled 2020-03-04: qty 150

## 2020-03-04 MED ORDER — HEPARIN SOD (PORK) LOCK FLUSH 100 UNIT/ML IV SOLN
500.0000 [IU] | Freq: Once | INTRAVENOUS | Status: AC | PRN
  Administered 2020-03-04: 500 [IU]

## 2020-03-04 MED ORDER — SODIUM CHLORIDE 0.9 % IV SOLN
397.0000 mg | Freq: Once | INTRAVENOUS | Status: AC
  Administered 2020-03-04: 400 mg via INTRAVENOUS
  Filled 2020-03-04: qty 40

## 2020-03-04 MED ORDER — SODIUM CHLORIDE 0.9% FLUSH
10.0000 mL | INTRAVENOUS | Status: DC | PRN
  Administered 2020-03-04: 10 mL

## 2020-03-04 NOTE — Progress Notes (Signed)
Patient has been assessed, vital signs and labs have been reviewed by Dr. Katragadda. ANC, Creatinine, LFTs, and Platelets are within treatment parameters per Dr. Katragadda. The patient is good to proceed with treatment at this time.  

## 2020-03-04 NOTE — Progress Notes (Signed)
1010 :  Chemotherapy education packet given and discussed with patient in detail.  Discussed diagnosis and staging, tx regimen, and intent of tx.  Reviewed chemotherapy medications and side effects, as well as pre-medications.  Instructed on how to manage side effects at home, and when to call the clinic.  Importance of fever/chills discussed with patient. Discussed precautions to implement at home after receiving tx, as well as self care strategies. Phone numbers provided for clinic during regular working hours, also how to reach the clinic after hours and on weekends. Patient was provided the opportunity to ask questions - all questions answered to his satisfaction.  Consent obtained at this time.

## 2020-03-04 NOTE — Patient Instructions (Signed)
[facility] Discharge Instructions for Patients Receiving Chemotherapy   Beginning January 23rd 2017 lab work for the [facility] will be done in the  Main lab at [facility] on 1st floor. If you have a lab appointment with the [facility] please come in thru the  Main Entrance and check in at the main information desk   Today you received the following chemotherapy agents Gemzar and Carboplatin  To help prevent nausea and vomiting after your treatment, we encourage you to take your nausea medication   If you develop nausea and vomiting, or diarrhea that is not controlled by your medication, call the clinic.  The clinic phone number is [phone]. Office hours are Monday-Friday 8:30am-5:00pm.  BELOW ARE SYMPTOMS THAT SHOULD BE REPORTED IMMEDIATELY:  *FEVER GREATER THAN 101.0 F  *CHILLS WITH OR WITHOUT FEVER  NAUSEA AND VOMITING THAT IS NOT CONTROLLED WITH YOUR NAUSEA MEDICATION  *UNUSUAL SHORTNESS OF BREATH  *UNUSUAL BRUISING OR BLEEDING  TENDERNESS IN MOUTH AND THROAT WITH OR WITHOUT PRESENCE OF ULCERS  *URINARY PROBLEMS  *BOWEL PROBLEMS  UNUSUAL RASH Items with * indicate a potential emergency and should be followed up as soon as possible. If you have an emergency after office hours please contact your primary care physician or go to the nearest emergency department.  Please call the clinic during office hours if you have any questions or concerns.   You may also contact the Patient Navigator at [phone] should you have any questions or need assistance in obtaining follow up care.      Resources For Cancer Patients and their Caregivers ? American Cancer Society: Can assist with transportation, wigs, general needs, runs Look Good Feel Better.        364-808-4664 ? Cancer Care: Provides financial assistance, online support groups, medication/co-pay assistance.  1-800-813-HOPE (314)211-9698) ? [facility] Assists  [place] cancer patients and their families through emotional , educational and financial support.  9720775949 ? [place] DSS Where to apply for food stamps, Medicaid and utility assistance. 3474232800 ? [facility]: Transportation to medical appointments. (564)539-2368 ? Social Security Administration: May apply for disability if have a Stage IV cancer. 6208885615 316-267-8347 ? [employer], Disability and Transit Services: Assists with nutrition, care and transit needs. 757-653-8380

## 2020-03-04 NOTE — Progress Notes (Signed)
[name], [place] [address]   CLINIC:  Medical Oncology/Hematology  PCP:  [name], MD [address], [place] / [place] [address] [phone]   REASON FOR VISIT:  Follow-up for metastatic bladder cancer  CURRENT THERAPY: Gemcitabine and carboplatin.  BRIEF ONCOLOGIC HISTORY:  Oncology History  Metastatic urothelial carcinoma ([place])  02/13/2020 Initial Diagnosis   Metastatic urothelial carcinoma ([place])   03/04/2020 -  Chemotherapy   The patient had palonosetron (ALOXI) injection 0.25 mg, 0.25 mg, Intravenous,  Once, 0 of 4 cycles CARBOplatin (PARAPLATIN) in sodium chloride 0.9 % 100 mL chemo infusion, , Intravenous,  Once, 0 of 4 cycles gemcitabine (GEMZAR) 1,558 mg in sodium chloride 0.9 % 250 mL chemo infusion, 1,000 mg/m2, Intravenous,  Once, 0 of 4 cycles fosaprepitant (EMEND) 150 mg in sodium chloride 0.9 % 145 mL IVPB, 150 mg, Intravenous,  Once, 0 of 4 cycles  for chemotherapy treatment.      CANCER STAGING: Cancer Staging No matching staging information was found for the patient.  INTERVAL HISTORY:  [name], a 69 y.o. male, returns for routine follow-up and consideration for next cycle of chemotherapy. [name] was last seen on 02/13/2020.  Due for initiating cycle #1 of gemcitabine and carboplatin today.   He reports that his nephrostomy tubes are occasionally leaking. His leg swelling is not improving. He denies having numbness/tingling in his hands or feet or tinnitus. He reports that he felt better after his last Feraheme infusion.  Overall, he feels ready for next cycle of chemo today.    REVIEW OF SYSTEMS:  Review of Systems  Constitutional: Negative for appetite change and fatigue.  Cardiovascular: Positive for leg swelling.  All other systems reviewed and are negative.   PAST MEDICAL/SURGICAL HISTORY:  Past Medical History:  Diagnosis Date  . Arterial embolus of lower extremity  ([place]) 11/23/2013  . Arthritis   . CAD (coronary artery disease)    a. s/p PCI with 3.5 x 18 BMS to mid RCA, non obst LAD, Lcx patent 08/10/13  . Cancer ([place]) 2021   bladder  . Depression, major, single episode, mild ([place]) 12/20/2019  . Embolus of femoral artery [place]) 2012   Surgical repair, previously on Coumadin  . Essential hypertension   . ETOH abuse   . History of DVT (deep vein thrombosis) 2011  . History of GI bleed    February 2012   . Ingrown left greater toenail 09/04/2018  . 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 Port-A-Cath in place 02/23/2020  . Prosthetic valve endocarditis ([place])   . PVD (peripheral vascular disease) ([place])   . S/P aortic valve replacement    Details not clear -  reportedly 1971 per patient   Past Surgical History:  Procedure Laterality Date  . ABDOMINAL AORTOGRAM W/LOWER EXTREMITY N/A 10/31/2018   Procedure: ABDOMINAL AORTOGRAM W/LOWER EXTREMITY;  Surgeon: Lorretta Harp, MD;  Location: [facility];  Service: Cardiovascular;  Laterality: N/A;  . Aoric valve replacement  January 2005   60m Medtronic Freestyle stentless porcine bioprosthesis ([facility]  . CARDIAC CATHETERIZATION     30-40% mid LAD, 20-30% ostial D1, proximal 30-40% RCA, 95% mid RCA thrombus, 40% distal stenosis. EF 45%, inferior HK.  .Marland KitchenCOLONOSCOPY N/A 04/20/2014   Procedure: COLONOSCOPY;  Surgeon: NRogene Houston MD;  [facility];  Service: Endoscopy;  Laterality: N/A;  930  . COLONOSCOPY WITH PROPOFOL N/A 02/02/2020   Procedure: COLONOSCOPY WITH PROPOFOL;  Surgeon: Rogene Houston, MD;  [facility];  Service: Endoscopy;  Laterality: N/A;  . CORONARY ANGIOPLASTY WITH STENT PLACEMENT  07/2013  . ESOPHAGOGASTRODUODENOSCOPY (EGD) WITH PROPOFOL N/A 02/02/2020   Procedure: ESOPHAGOGASTRODUODENOSCOPY (EGD) WITH PROPOFOL;  Surgeon: Rogene Houston, MD;   [facility];  Service: Endoscopy;  Laterality: N/A;  . INGUINAL HERNIA REPAIR Bilateral 04/17/2019   Procedure: ATTEMPTED LAPAROSCOPIC BILATERAL INGUINAL HERNIA REPAIR;  Surgeon: Coralie Keens, MD;  Location: [facility];  Service: General;  Laterality: Bilateral;  . INGUINAL HERNIA REPAIR Bilateral 04/17/2019   Procedure: Hernia Repair Inguinal Adult;  Surgeon: Coralie Keens, MD;  Location: [facility];  Service: General;  Laterality: Bilateral;  . IR IMAGING GUIDED PORT INSERTION  02/22/2020  . IR NEPHROSTOMY PLACEMENT LEFT  02/22/2020  . IR NEPHROSTOMY PLACEMENT RIGHT  02/22/2020  . LEFT HEART CATHETERIZATION WITH CORONARY ANGIOGRAM N/A 08/10/2013   Procedure: LEFT HEART CATHETERIZATION WITH CORONARY ANGIOGRAM;  Surgeon: Blane Ohara, MD;  Location: [facility] LAB;  Service: Cardiovascular;  Laterality: N/A;  . Left leg embolectomy  May 2012   Dr. Trula Slade - reason for lifelong coumadin  . LYMPH NODE BIOPSY Left 11/21/2019   Procedure: Left Neck LYMPH NODE BIOPSY;  Surgeon: Rozetta Nunnery, MD;  Location: [facility];  Service: ENT;  Laterality: Left;  . PERIPHERAL VASCULAR ATHERECTOMY  10/31/2018   Procedure: PERIPHERAL VASCULAR ATHERECTOMY;  Surgeon: Lorretta Harp, MD;  Location: [facility];  Service: Cardiovascular;;  left SFA  . POLYPECTOMY  02/02/2020   Procedure: POLYPECTOMY;  Surgeon: Rogene Houston, MD;  [facility];  Service: Endoscopy;;  splenic flexure    SOCIAL HISTORY:  Social History   Socioeconomic History  . Marital status: Single    Spouse name: Not on file  . Number of children: 2  . Years of education: Not on file  . Highest education level: 11th grade  Occupational History  . Occupation: disability  Tobacco Use  . Smoking status: Former Smoker    Packs/day: 0.50    Years: 3.00    Pack years: 1.50    Types: Cigarettes    Start date: 10/12/1965    Quit date: 02/19/1970    Years since quitting: 50.0  . Smokeless tobacco: Never Used    Vaping Use  . Vaping Use: Never used  Substance and Sexual Activity  . Alcohol use: Not Currently  . Drug use: Yes    Types: Marijuana    Comment: pt smokes weed once in the morning and once in the evening  . Sexual activity: Not Currently  Other Topics Concern  . Not on file  Social History Narrative  . Not on file   Social Determinants of Health   Financial Resource Strain:   . Difficulty of Paying Living Expenses:   Food Insecurity:   . Worried About Charity fundraiser in the Last Year:   . Arboriculturist in the Last Year:   Transportation Needs:   . Film/video editor (Medical):   [name] Lack of Transportation (Non-Medical):   Physical Activity:   . Days of Exercise per Week:   . Minutes of Exercise per Session:   Stress:   . Feeling of Stress :   Social Connections:   . Frequency of Communication with Friends and Family:   .  Frequency of Social Gatherings with Friends and Family:   . Attends Religious Services:   . Active Member of Clubs or Organizations:   . Attends Archivist Meetings:   [name] Marital Status:   Intimate Partner Violence:   . Fear of Current or Ex-Partner:   . Emotionally Abused:   [name] Physically Abused:   . Sexually Abused:     FAMILY HISTORY:  Family History  Problem Relation Age of Onset  . Diabetes Mother   . Cirrhosis Father   . COPD Sister     CURRENT MEDICATIONS:  Current Outpatient Medications  Medication Sig Dispense Refill  . aspirin EC 81 MG tablet Take 1 tablet (81 mg total) by mouth daily. 90 tablet 3  . atorvastatin (LIPITOR) 40 MG tablet Take 1 tablet by mouth once daily 90 tablet 0  . busPIRone (BUSPAR) 10 MG tablet Take 1 tablet (10 mg total) by mouth 2 (two) times daily. 60 tablet 2  . CARBOPLATIN IV Inject into the vein. Days 1, 8 every 21 days    . cholecalciferol (VITAMIN D) 1000 units tablet Take 1 tablet (1,000 Units total) by mouth daily. 90 tablet 3  . clopidogrel (PLAVIX) 75 MG tablet Take 1 tablet by  mouth once daily (Patient taking differently: Take 75 mg by mouth daily. ) 90 tablet 0  . escitalopram (LEXAPRO) 10 MG tablet Take 1 tablet (10 mg total) by mouth daily. 30 tablet 1  . fluticasone (FLONASE) 50 MCG/ACT nasal spray Place 2 sprays into both nostrils daily. 16 g 6  . furosemide (LASIX) 40 MG tablet Take 1 tablet by mouth once daily 90 tablet 0  . GEMCITABINE HCL IV Inject 1,000 mg/m2 into the vein. Days 1, 8 every 21 days    . HYDROcodone-acetaminophen (NORCO/VICODIN) 5-325 MG tablet Take 1 tablet by mouth every 6 (six) hours as needed for moderate pain. 10 tablet 0  . lidocaine-prilocaine (EMLA) cream Apply a small amount to port a cath site and cover with plastic wrap 1 hour prior to chemotherapy appointments 30 g 3  . lisinopril (ZESTRIL) 20 MG tablet Take 1 tablet by mouth once daily 90 tablet 0  . metoprolol succinate (TOPROL-XL) 50 MG 24 hr tablet TAKE 1 TABLET BY MOUTH ONCE DAILY WITH  OR  IMMEDIATELY  FOLLOWING  A  MEAL 90 tablet 0  . montelukast (SINGULAIR) 10 MG tablet TAKE 1 TABLET BY MOUTH AT BEDTIME 90 tablet 0  . nitroGLYCERIN (NITROSTAT) 0.4 MG SL tablet Place 1 tablet (0.4 mg total) under the tongue every 5 (five) minutes as needed for chest pain. 25 tablet 0  . pantoprazole (PROTONIX) 40 MG tablet Take 1 tablet (40 mg total) by mouth daily before breakfast. 30 tablet 2  . prochlorperazine (COMPAZINE) 10 MG tablet Take 1 tablet (10 mg total) by mouth every 6 (six) hours as needed (Nausea or vomiting). 30 tablet 1  . trolamine salicylate (ASPERCREME) 10 % cream Apply 1 application topically as needed for muscle pain.     No current facility-administered medications for this visit.   Facility-Administered Medications Ordered in Other Visits  Medication Dose Route Frequency Provider Last Rate Last Admin  . sodium chloride flush (NS) 0.9 % injection 10 mL  10 mL Intravenous PRN Derek Jack, MD   10 mL at 03/04/20 0102    ALLERGIES:  No Known  Allergies  PHYSICAL EXAM:  Performance status (ECOG): 1 - Symptomatic but completely ambulatory  Vitals:   03/04/20 0857  BP: 128/64  Pulse: 73  Resp: 18  Temp: (!) 97.5 F (36.4 C)  SpO2: 100%   Wt Readings from Last 3 Encounters:  02/21/20 120 lb (54.4 kg)  02/16/20 121 lb (54.9 kg)  02/13/20 121 lb (54.9 kg)   Physical Exam Vitals reviewed.  Constitutional:      Appearance: Normal appearance.  Cardiovascular:     Rate and Rhythm: Normal rate and regular rhythm.     Pulses: Normal pulses.     Heart sounds: Normal heart sounds.  Pulmonary:     Effort: Pulmonary effort is normal.     Breath sounds: Normal breath sounds.  Musculoskeletal:     Right lower leg: Edema (+2) present.     Left lower leg: Edema (+1) present.  Neurological:     General: No focal deficit present.     Mental Status: He is alert and oriented to person, place, and time.  Psychiatric:        Mood and Affect: Mood normal.        Behavior: Behavior normal.     LABORATORY DATA:  I have reviewed the labs as listed.  CBC Latest Ref Rng & Units 02/22/2020 02/16/2020 02/02/2020  WBC 4.0 - 10.5 K/uL 8.5 10.6(H) 9.5  Hemoglobin 13.0 - 17.0 g/dL 10.0(L) 10.9(L) 8.7(L)  Hematocrit 39 - 52 % 32.9(L) 37.2(L) 29.2(L)  Platelets 150 - 400 K/uL 385 446(H) 357   CMP Latest Ref Rng & Units 02/22/2020 02/16/2020 02/13/2020  Glucose 70 - 99 mg/dL 92 113(H) 105(H)  BUN 8 - 23 mg/dL '20 12 15  ' Creatinine 0.61 - 1.24 mg/dL 1.41(H) 0.99 0.87  Sodium 135 - 145 mmol/L 128(L) 131(L) 129(L)  Potassium 3.5 - 5.1 mmol/L 4.2 3.3(L) 4.1  Chloride 98 - 111 mmol/L 95(L) 94(L) 96(L)  CO2 22 - 32 mmol/L '22 27 24  ' Calcium 8.9 - 10.3 mg/dL 8.5(L) 8.4(L) 8.3(L)  Total Protein 6.5 - 8.1 g/dL - - 6.8  Total Bilirubin 0.3 - 1.2 mg/dL - - 1.0  Alkaline Phos 38 - 126 U/L - - 138(H)  AST 15 - 41 U/L - - 16  ALT 0 - 44 U/L - - 13    DIAGNOSTIC IMAGING:  I have independently reviewed the scans and discussed with the patient. US Venous  Img Lower Unilateral Right  Result Date: 02/13/2020 CLINICAL DATA:  69 year old male with a history swelling EXAM: RIGHT LOWER EXTREMITY VENOUS DOPPLER ULTRASOUND TECHNIQUE: Gray-scale sonography with graded compression, as well as color Doppler and duplex ultrasound were performed to evaluate the lower extremity deep venous systems from the level of the common femoral vein and including the common femoral, femoral, profunda femoral, popliteal and calf veins including the posterior tibial, peroneal and gastrocnemius veins when visible. The superficial great saphenous vein was also interrogated. Spectral Doppler was utilized to evaluate flow at rest and with distal augmentation maneuvers in the common femoral, femoral and popliteal veins. COMPARISON:  None. FINDINGS: Contralateral Common Femoral Vein: Respiratory phasicity is normal and symmetric with the symptomatic side. No evidence of thrombus. Normal compressibility. Common Femoral Vein: No evidence of thrombus. Normal compressibility, respiratory phasicity and response to augmentation. Saphenofemoral Junction: No evidence of thrombus. Normal compressibility and flow on color Doppler imaging. Profunda Femoral Vein: No evidence of thrombus. Normal compressibility and flow on color Doppler imaging. Femoral Vein: No evidence of thrombus. Normal compressibility, respiratory phasicity and response to augmentation. Popliteal Vein: No evidence of thrombus. Normal compressibility, respiratory phasicity and response to augmentation. Calf Veins: No evidence of  thrombus. Normal compressibility and flow on color Doppler imaging. Superficial Great Saphenous Vein: No evidence of thrombus. Normal compressibility and flow on color Doppler imaging. Other Findings:  Edema IMPRESSION: Sonographic survey of the right lower extremity negative for DVT. Edema Electronically Signed   By: Corrie Mckusick D.O.   On: 02/13/2020 13:18   US SCROTUM W/DOPPLER  Result Date:  02/16/2020 CLINICAL DATA:  Scrotal swelling EXAM: SCROTAL ULTRASOUND DOPPLER ULTRASOUND OF THE TESTICLES TECHNIQUE: Complete ultrasound examination of the testicles, epididymis, and other scrotal structures was performed. Color and spectral Doppler ultrasound were also utilized to evaluate blood flow to the testicles. COMPARISON:  None. FINDINGS: Right testicle Measurements: 4.3 x 2.6 x 2.3 cm. No mass or microlithiasis visualized. Left testicle Measurements: 4.8 x 2.9 x 3 cm. No mass or microlithiasis visualized. Right epididymis:  Normal in size and appearance. Left epididymis:  Normal in size and appearance. Hydrocele: There are moderate-sized bilateral hydroceles, greater on the left. Varicocele:  None visualized. Pulsed Doppler interrogation of both testes demonstrates normal low resistance arterial and venous waveforms bilaterally. Extensive nonspecific scrotal wall thickening is noted. IMPRESSION: 1. No evidence for testicular torsion. 2. No evidence for epididymitis or orchitis. 3. There is extensive scrotal wall thickening of unknown clinical significance. Correlation with physical exam is recommended. 4. Moderate-sized bilateral hydroceles, left greater than right. Electronically Signed   By: Constance Holster M.D.   On: 02/16/2020 15:00   IR NEPHROSTOMY PLACEMENT LEFT  Result Date: 02/22/2020 INDICATION: 69 year old with bladder cancer and bilateral hydronephrosis. Request for bilateral nephrostomy tubes. EXAM: PLACEMENT OF BILATERAL NEPHROSTOMY TUBES WITH ULTRASOUND AND FLUOROSCOPIC GUIDANCE COMPARISON:  None. MEDICATIONS: Ancef 2 g; The antibiotic was administered in an appropriate time frame prior to skin puncture. ANESTHESIA/SEDATION: Fentanyl 150 mcg IV; Versed 3.0 mg IV Moderate Sedation Time:  40 minutes The patient was continuously monitored during the procedure by the interventional radiology nurse under my direct supervision. CONTRAST:  25 mL Omnipaque 300-administered into the collecting  system(s) FLUOROSCOPY TIME:  Fluoroscopy Time: 5 minutes, 24 seconds, 50 mGy COMPLICATIONS: None immediate. PROCEDURE: Informed written consent was obtained from the patient after a thorough discussion of the procedural risks, benefits and alternatives. All questions were addressed. Maximal Sterile Barrier Technique was utilized including caps, mask, sterile gowns, sterile gloves, sterile drape, hand hygiene and skin antiseptic. A timeout was performed prior to the initiation of the procedure. Patient was placed prone. Both flanks were prepped and draped in sterile fashion. Ultrasound was used to identify the left kidney. Limited visualization of the left kidney due to overlying bowel gas. Only the left kidney upper pole could be well demonstrated with ultrasound due to the artifact from bowel and rib. Skin was anesthetized with 1% lidocaine. Using ultrasound guidance, an upper pole calyx was targeted. Needle was directed into the upper pole calyx with ultrasound guidance and contrast injection confirmed placement in the renal collecting system. A 0.018 wire was advanced into the collecting system and a transitional dilator set was placed. The tract was dilated to accommodate a 10 [place] multipurpose drain. Contrast injection confirmed placement in the renal pelvis. Catheter was sutured to skin and attached to a gravity bag. Attention was directed to the right kidney. Ultrasound was used to identify the right kidney. Calyx in the lower pole of the right kidney was targeted. Skin was anesthetized with 1% lidocaine. Using ultrasound guidance, needle was directed into the calyx and a 0.018 wire was advanced in the renal collecting system. Transitional dilator set was  placed. Tract was dilated to accommodate a 10 [place] multipurpose drain. Drain placed in the renal pelvis. Contrast injection confirmed placement in the renal pelvis. Drain was flushed with saline and attached to a gravity bag. Catheter was sutured to skin  and a dressing was placed. Fluoroscopic and ultrasound images were taken and saved for documentation. FINDINGS: Moderate hydronephrosis bilaterally. Left nephrostomy tube was placed in an upper pole calyx because the remainder of the kidney was obscured by bowel gas. Right nephrostomy tube was placed in a lower pole calyx. IMPRESSION: Successful placement of bilateral nephrostomy tubes with ultrasound and fluoroscopic guidance. Electronically Signed   By: Markus Daft M.D.   On: 02/22/2020 18:21   IR NEPHROSTOMY PLACEMENT RIGHT  Result Date: 02/22/2020 INDICATION: 69 year old with bladder cancer and bilateral hydronephrosis. Request for bilateral nephrostomy tubes. EXAM: PLACEMENT OF BILATERAL NEPHROSTOMY TUBES WITH ULTRASOUND AND FLUOROSCOPIC GUIDANCE COMPARISON:  None. MEDICATIONS: Ancef 2 g; The antibiotic was administered in an appropriate time frame prior to skin puncture. ANESTHESIA/SEDATION: Fentanyl 150 mcg IV; Versed 3.0 mg IV Moderate Sedation Time:  40 minutes The patient was continuously monitored during the procedure by the interventional radiology nurse under my direct supervision. CONTRAST:  25 mL Omnipaque 300-administered into the collecting system(s) FLUOROSCOPY TIME:  Fluoroscopy Time: 5 minutes, 24 seconds, 50 mGy COMPLICATIONS: None immediate. PROCEDURE: Informed written consent was obtained from the patient after a thorough discussion of the procedural risks, benefits and alternatives. All questions were addressed. Maximal Sterile Barrier Technique was utilized including caps, mask, sterile gowns, sterile gloves, sterile drape, hand hygiene and skin antiseptic. A timeout was performed prior to the initiation of the procedure. Patient was placed prone. Both flanks were prepped and draped in sterile fashion. Ultrasound was used to identify the left kidney. Limited visualization of the left kidney due to overlying bowel gas. Only the left kidney upper pole could be well demonstrated with  ultrasound due to the artifact from bowel and rib. Skin was anesthetized with 1% lidocaine. Using ultrasound guidance, an upper pole calyx was targeted. Needle was directed into the upper pole calyx with ultrasound guidance and contrast injection confirmed placement in the renal collecting system. A 0.018 wire was advanced into the collecting system and a transitional dilator set was placed. The tract was dilated to accommodate a 10 [place] multipurpose drain. Contrast injection confirmed placement in the renal pelvis. Catheter was sutured to skin and attached to a gravity bag. Attention was directed to the right kidney. Ultrasound was used to identify the right kidney. Calyx in the lower pole of the right kidney was targeted. Skin was anesthetized with 1% lidocaine. Using ultrasound guidance, needle was directed into the calyx and a 0.018 wire was advanced in the renal collecting system. Transitional dilator set was placed. Tract was dilated to accommodate a 10 [place] multipurpose drain. Drain placed in the renal pelvis. Contrast injection confirmed placement in the renal pelvis. Drain was flushed with saline and attached to a gravity bag. Catheter was sutured to skin and a dressing was placed. Fluoroscopic and ultrasound images were taken and saved for documentation. FINDINGS: Moderate hydronephrosis bilaterally. Left nephrostomy tube was placed in an upper pole calyx because the remainder of the kidney was obscured by bowel gas. Right nephrostomy tube was placed in a lower pole calyx. IMPRESSION: Successful placement of bilateral nephrostomy tubes with ultrasound and fluoroscopic guidance. Electronically Signed   By: Markus Daft M.D.   On: 02/22/2020 18:21   IR IMAGING GUIDED PORT INSERTION  Result  Date: 02/22/2020 INDICATION: 69 year old with metastatic bladder cancer. EXAM: FLUOROSCOPIC AND ULTRASOUND GUIDED PLACEMENT OF A SUBCUTANEOUS PORT COMPARISON:  None. MEDICATIONS: Ancef 2 g was given for the  nephrostomy tube procedures which was performed immediately prior to the port placement. ANESTHESIA/SEDATION: Versed 1.0 mg IV; Fentanyl 50 mcg IV; Moderate Sedation Time:  23 minutes The patient was continuously monitored during the procedure by the interventional radiology nurse under my direct supervision. FLUOROSCOPY TIME:  12 seconds, 2 mGy COMPLICATIONS: None immediate. PROCEDURE: The procedure, risks, benefits, and alternatives were explained to the patient. Questions regarding the procedure were encouraged and answered. The patient understands and consents to the procedure. Patient was placed supine on the interventional table. Ultrasound confirmed a patent right internal jugular vein. Ultrasound image was saved for documentation. The right chest and neck were cleaned with a skin antiseptic and a sterile drape was placed. Maximal barrier sterile technique was utilized including caps, mask, sterile gowns, sterile gloves, sterile drape, hand hygiene and skin antiseptic. The right neck was anesthetized with 1% lidocaine. Small incision was made in the right neck with a blade. Micropuncture set was placed in the right internal jugular vein with ultrasound guidance. The micropuncture wire was used for measurement purposes. The right chest was anesthetized with 1% lidocaine with epinephrine. #15 blade was used to make an incision and a subcutaneous port pocket was formed. [place] was assembled. Subcutaneous tunnel was formed with a stiff tunneling device. The port catheter was brought through the subcutaneous tunnel. The port was placed in the subcutaneous pocket. The micropuncture set was exchanged for a peel-away sheath. The catheter was placed through the peel-away sheath and the tip was positioned at the SVC and right atrium junction. Catheter placement was confirmed with fluoroscopy. The port was accessed and flushed with heparinized saline. The port pocket was closed using two layers of  absorbable sutures and Dermabond. The vein skin site was closed using a single layer of absorbable suture and Dermabond. Sterile dressings were applied. Patient tolerated the procedure well without an immediate complication. Ultrasound and fluoroscopic images were taken and saved for this procedure. IMPRESSION: Placement of a subcutaneous port device. Catheter tip at the SVC and right atrium junction. Electronically Signed   By: Markus Daft M.D.   On: 02/22/2020 18:24     ASSESSMENT:  1.  Metastatic urothelial carcinoma: -Left supraclavicular lymph node biopsy on 11/21/2019 showed metastatic urothelial carcinoma. -PET scan on 10/30/2019 showed right bladder mass with right hydroureteronephrosis.  Pelvic, retroperitoneal and left supraclavicular adenopathy. -CT CAP on 02/02/2020 shows progression of bladder wall thickening, increased retroperitoneal and pelvic adenopathy.  Interval development of multiple faint foci of sclerosis involving ribs, spine, pelvic bones consistent with bone meta stasis.  2.7 cm exophytic right kidney mass but PET negative.  Small amount of ascites. -PD-L1 CPS 1%.  2.  Bone metastasis: -Recent CT showed multiple foci of sclerosis involving ribs, spine, pelvic bones consistent with metastatic disease. -We will consider denosumab after first cycle.   PLAN:  1.  Metastatic urothelial carcinoma: -We reviewed his labs. -Because of his functional status, social factors including transportation and incurable disease, I think he would be a better candidate for carboplatin. -We will proceed with the carboplatin and gemcitabine day 1 day 8 every 21 days. -We discussed side effects in detail. -[name] will come back for day 8 of treatment next week.  2.  Right dorsum and leg swelling: -Right foot dorsum is more swollen along with scrotal swelling. -  Doppler on 02/13/2020 was negative.  3.  Bone metastasis: -Consider Xgeva with cycle 2.  4.  Hydronephrosis: -Had bilateral  percutaneous nephrostomy tubes on 02/22/2020. -Creatinine today normalized.  We will change his bandages today.  5.  Microcytic anemia: -Hemoglobin today is 9.2 with MCV of 73.2. -Ferritin was 5 and percent saturation of 3 with normal folic acid and G95. -I have recommended Feraheme weekly x2.  We discussed side effects in detail including rare chance of anaphylactic reactions. -[name] will proceed with first infusion today.  Orders placed this encounter:  No orders of the defined types were placed in this encounter.    Derek Jack, MD [place] [phone]   I, Milinda Antis, am acting as a scribe for Dr. Sanda Linger.  I, Derek Jack MD, have reviewed the above documentation for accuracy and completeness, and I agree with the above.

## 2020-03-04 NOTE — Patient Instructions (Addendum)
[place] at [facility] Discharge Instructions  You were seen today by Dr. Delton Coombes. He went over your recent results. The side effects were discussed today; please be vigilant for any symptoms and let us know. You will start receiving weekly treatments. You will be seen by the PA next week for labs and follow up. Dr. Delton Coombes will see you in 3 weeks for labs and follow up.   Thank you for choosing [place] at [facility] to provide your oncology and hematology care.  To afford each patient quality time with our provider, please arrive at least 15 minutes before your scheduled appointment time.   If you have a lab appointment with the [facility] please come in thru the Main Entrance and check in at the main information desk  You need to re-schedule your appointment should you arrive 10 or more minutes late.  We strive to give you quality time with our providers, and arriving late affects you and other patients whose appointments are after yours.  Also, if you no show three or more times for appointments you may be dismissed from the clinic at the providers discretion.     Again, thank you for choosing [facility].  Our hope is that these requests will decrease the amount of time that you wait before being seen by our physicians.       _____________________________________________________________  Should you have questions after your visit to [facility], please contact our office at [phone] between the hours of 8:00 a.m. and 4:30 p.m.  Voicemails left after 4:00 p.m. will not be returned until the following business day.  For prescription refill requests, have your pharmacy contact our office and allow 72 hours.    Cancer Center Support Programs:   > Cancer Support Group  2nd Tuesday of the month 1pm-2pm, Journey Room

## 2020-03-04 NOTE — Progress Notes (Signed)
[name] presents today for D1C1 Gemzar and Carboplatin. Lab results and vital signs stable. Assessed by Dr. Delton Coombes who approves proceeding with treatment today.   Dressings changed on BL nephrectomy drains. Sites cleaned with CHG. Sites clean and dry, no swelling, redness, or drainage noted. Sutures intact around tubes.  Infusions tolerated without incident or complaint. VSS upon completion of treatment. Discharged in satisfactory condition with follow up instructions.

## 2020-03-05 ENCOUNTER — Telehealth (HOSPITAL_COMMUNITY): Payer: Self-pay

## 2020-03-05 NOTE — Telephone Encounter (Signed)
24 HR Chemo F/U call: Pt reports feeling fine since receiving chemo treatment yesterday. Pt denies any fever,chills,rash,N+V or dyspnea at this time. Pt encouraged to call for any questions or issues and verbalized understanding

## 2020-03-06 ENCOUNTER — Other Ambulatory Visit: Payer: Self-pay

## 2020-03-06 ENCOUNTER — Ambulatory Visit: Payer: Medicare Other | Admitting: Urology

## 2020-03-06 ENCOUNTER — Inpatient Hospital Stay (HOSPITAL_COMMUNITY)
Admit: 2020-03-06 | Discharge: 2020-03-10 | DRG: 698 | Disposition: A | Discharge status: Home / Self Care | Payer: Medicare Other | Attending: Internal Medicine | Admitting: Internal Medicine

## 2020-03-06 ENCOUNTER — Encounter (HOSPITAL_COMMUNITY): Payer: Self-pay | Admitting: *Deleted

## 2020-03-06 ENCOUNTER — Inpatient Hospital Stay (HOSPITAL_COMMUNITY): Payer: Medicare Other

## 2020-03-06 ENCOUNTER — Emergency Department (HOSPITAL_COMMUNITY): Payer: Medicare Other

## 2020-03-06 DIAGNOSIS — Z952 Presence of prosthetic heart valve: Secondary | ICD-10-CM | POA: Diagnosis not present

## 2020-03-06 DIAGNOSIS — Z86718 Personal history of other venous thrombosis and embolism: Secondary | ICD-10-CM

## 2020-03-06 DIAGNOSIS — Z79899 Other long term (current) drug therapy: Secondary | ICD-10-CM

## 2020-03-06 DIAGNOSIS — I110 Hypertensive heart disease with heart failure: Secondary | ICD-10-CM | POA: Diagnosis present

## 2020-03-06 DIAGNOSIS — Z7902 Long term (current) use of antithrombotics/antiplatelets: Secondary | ICD-10-CM | POA: Diagnosis not present

## 2020-03-06 DIAGNOSIS — D649 Anemia, unspecified: Secondary | ICD-10-CM | POA: Diagnosis not present

## 2020-03-06 DIAGNOSIS — F320 Major depressive disorder, single episode, mild: Secondary | ICD-10-CM | POA: Diagnosis present

## 2020-03-06 DIAGNOSIS — Z20822 Contact with and (suspected) exposure to covid-19: Secondary | ICD-10-CM | POA: Diagnosis present

## 2020-03-06 DIAGNOSIS — N390 Urinary tract infection, site not specified: Secondary | ICD-10-CM | POA: Diagnosis not present

## 2020-03-06 DIAGNOSIS — Z7982 Long term (current) use of aspirin: Secondary | ICD-10-CM | POA: Diagnosis not present

## 2020-03-06 DIAGNOSIS — Z825 Family history of asthma and other chronic lower respiratory diseases: Secondary | ICD-10-CM | POA: Diagnosis not present

## 2020-03-06 DIAGNOSIS — I5043 Acute on chronic combined systolic (congestive) and diastolic (congestive) heart failure: Secondary | ICD-10-CM | POA: Diagnosis present

## 2020-03-06 DIAGNOSIS — C7911 Secondary malignant neoplasm of bladder: Secondary | ICD-10-CM | POA: Diagnosis not present

## 2020-03-06 DIAGNOSIS — N12 Tubulo-interstitial nephritis, not specified as acute or chronic: Secondary | ICD-10-CM

## 2020-03-06 DIAGNOSIS — C7910 Secondary malignant neoplasm of unspecified urinary organs: Secondary | ICD-10-CM

## 2020-03-06 DIAGNOSIS — A4159 Other Gram-negative sepsis: Secondary | ICD-10-CM | POA: Diagnosis present

## 2020-03-06 DIAGNOSIS — N134 Hydroureter: Secondary | ICD-10-CM | POA: Diagnosis not present

## 2020-03-06 DIAGNOSIS — I2510 Atherosclerotic heart disease of native coronary artery without angina pectoris: Secondary | ICD-10-CM | POA: Diagnosis present

## 2020-03-06 DIAGNOSIS — Z833 Family history of diabetes mellitus: Secondary | ICD-10-CM

## 2020-03-06 DIAGNOSIS — R0602 Shortness of breath: Secondary | ICD-10-CM | POA: Diagnosis not present

## 2020-03-06 DIAGNOSIS — J939 Pneumothorax, unspecified: Secondary | ICD-10-CM | POA: Diagnosis not present

## 2020-03-06 DIAGNOSIS — I700 Atherosclerosis of aorta: Secondary | ICD-10-CM | POA: Diagnosis present

## 2020-03-06 DIAGNOSIS — C7951 Secondary malignant neoplasm of bone: Secondary | ICD-10-CM | POA: Diagnosis present

## 2020-03-06 DIAGNOSIS — D509 Iron deficiency anemia, unspecified: Secondary | ICD-10-CM | POA: Diagnosis present

## 2020-03-06 DIAGNOSIS — R509 Fever, unspecified: Secondary | ICD-10-CM | POA: Diagnosis not present

## 2020-03-06 DIAGNOSIS — C679 Malignant neoplasm of bladder, unspecified: Secondary | ICD-10-CM | POA: Diagnosis present

## 2020-03-06 DIAGNOSIS — Z955 Presence of coronary angioplasty implant and graft: Secondary | ICD-10-CM | POA: Diagnosis not present

## 2020-03-06 DIAGNOSIS — I739 Peripheral vascular disease, unspecified: Secondary | ICD-10-CM | POA: Diagnosis present

## 2020-03-06 DIAGNOSIS — K2100 Gastro-esophageal reflux disease with esophagitis, without bleeding: Secondary | ICD-10-CM | POA: Diagnosis present

## 2020-03-06 DIAGNOSIS — R319 Hematuria, unspecified: Secondary | ICD-10-CM | POA: Diagnosis not present

## 2020-03-06 DIAGNOSIS — I5023 Acute on chronic systolic (congestive) heart failure: Secondary | ICD-10-CM | POA: Diagnosis present

## 2020-03-06 DIAGNOSIS — T83512A Infection and inflammatory reaction due to nephrostomy catheter, initial encounter: Principal | ICD-10-CM | POA: Diagnosis present

## 2020-03-06 DIAGNOSIS — I517 Cardiomegaly: Secondary | ICD-10-CM | POA: Diagnosis not present

## 2020-03-06 DIAGNOSIS — R062 Wheezing: Secondary | ICD-10-CM | POA: Diagnosis not present

## 2020-03-06 DIAGNOSIS — Z95828 Presence of other vascular implants and grafts: Secondary | ICD-10-CM

## 2020-03-06 DIAGNOSIS — I10 Essential (primary) hypertension: Secondary | ICD-10-CM | POA: Diagnosis present

## 2020-03-06 DIAGNOSIS — Z7401 Bed confinement status: Secondary | ICD-10-CM | POA: Diagnosis not present

## 2020-03-06 DIAGNOSIS — Z87891 Personal history of nicotine dependence: Secondary | ICD-10-CM

## 2020-03-06 DIAGNOSIS — I472 Ventricular tachycardia: Secondary | ICD-10-CM | POA: Diagnosis not present

## 2020-03-06 DIAGNOSIS — K5900 Constipation, unspecified: Secondary | ICD-10-CM | POA: Diagnosis not present

## 2020-03-06 LAB — URINALYSIS, ROUTINE W REFLEX MICROSCOPIC
Bilirubin Urine: NEGATIVE
Bilirubin Urine: NEGATIVE
Glucose, UA: NEGATIVE mg/dL
Glucose, UA: NEGATIVE mg/dL
Ketones, ur: NEGATIVE mg/dL
Ketones, ur: NEGATIVE mg/dL
Nitrite: NEGATIVE
Nitrite: POSITIVE — AB
Protein, ur: >=300 mg/dL — AB
Protein, ur: 100 mg/dL — AB
RBC / HPF: >50 RBC/hpf — ABNORMAL HIGH (ref 0–5)
Specific Gravity, Urine: 1.012 (ref 1.005–1.030)
Specific Gravity, Urine: 1.014 (ref 1.005–1.030)
WBC, UA: >50 WBC/hpf — ABNORMAL HIGH (ref 0–5)
pH: 6 (ref 5.0–8.0)
pH: 6 (ref 5.0–8.0)

## 2020-03-06 LAB — CBC WITH DIFFERENTIAL/PLATELET
Abs Immature Granulocytes: 0.1 10*3/uL — ABNORMAL HIGH (ref 0.00–0.07)
Basophils Absolute: 0 10*3/uL (ref 0.0–0.1)
Basophils Relative: 0 %
Eosinophils Absolute: 0 10*3/uL (ref 0.0–0.5)
Eosinophils Relative: 0 %
HCT: 27.8 % — ABNORMAL LOW (ref 39.0–52.0)
Hemoglobin: 8.7 g/dL — ABNORMAL LOW (ref 13.0–17.0)
Immature Granulocytes: 1 %
Lymphocytes Relative: 2 %
Lymphs Abs: 0.4 10*3/uL — ABNORMAL LOW (ref 0.7–4.0)
MCH: 22.6 pg — ABNORMAL LOW (ref 26.0–34.0)
MCHC: 31.3 g/dL (ref 30.0–36.0)
MCV: 72.2 fL — ABNORMAL LOW (ref 80.0–100.0)
Monocytes Absolute: 0.1 10*3/uL (ref 0.1–1.0)
Monocytes Relative: 1 %
Neutro Abs: 16.1 10*3/uL — ABNORMAL HIGH (ref 1.7–7.7)
Neutrophils Relative %: 96 %
Platelets: 372 10*3/uL (ref 150–400)
RBC: 3.85 MIL/uL — ABNORMAL LOW (ref 4.22–5.81)
RDW: 22.5 % — ABNORMAL HIGH (ref 11.5–15.5)
WBC: 16.7 10*3/uL — ABNORMAL HIGH (ref 4.0–10.5)
nRBC: 0 % (ref 0.0–0.2)

## 2020-03-06 LAB — SARS CORONAVIRUS 2 BY RT PCR (HOSPITAL ORDER, PERFORMED IN ~~LOC~~ HOSPITAL LAB): SARS Coronavirus 2: NEGATIVE

## 2020-03-06 LAB — BASIC METABOLIC PANEL
Anion gap: 10 (ref 5–15)
BUN: 23 mg/dL (ref 8–23)
CO2: 23 mmol/L (ref 22–32)
Calcium: 7.7 mg/dL — ABNORMAL LOW (ref 8.9–10.3)
Chloride: 95 mmol/L — ABNORMAL LOW (ref 98–111)
Creatinine, Ser: 0.97 mg/dL (ref 0.61–1.24)
GFR calc Af Amer: >60 mL/min (ref >60)
GFR calc non Af Amer: >60 mL/min (ref >60)
Glucose, Bld: 89 mg/dL (ref 70–99)
Potassium: 4.7 mmol/L (ref 3.5–5.1)
Sodium: 128 mmol/L — ABNORMAL LOW (ref 135–145)

## 2020-03-06 LAB — LACTIC ACID, PLASMA: Lactic Acid, Venous: 1.5 mmol/L (ref 0.5–1.9)

## 2020-03-06 LAB — BRAIN NATRIURETIC PEPTIDE: B Natriuretic Peptide: 1930 pg/mL — ABNORMAL HIGH (ref 0.0–100.0)

## 2020-03-06 LAB — MAGNESIUM: Magnesium: 1.9 mg/dL (ref 1.7–2.4)

## 2020-03-06 MED ORDER — MORPHINE SULFATE (PF) 4 MG/ML IV SOLN
4.0000 mg | Freq: Once | INTRAVENOUS | Status: AC
  Administered 2020-03-06: 4 mg via INTRAVENOUS
  Filled 2020-03-06: qty 1

## 2020-03-06 MED ORDER — SODIUM CHLORIDE 0.9 % IV SOLN
1.0000 g | Freq: Once | INTRAVENOUS | Status: AC
  Administered 2020-03-06: 1 g via INTRAVENOUS
  Filled 2020-03-06: qty 10

## 2020-03-06 MED ORDER — HEPARIN SODIUM (PORCINE) 5000 UNIT/ML IJ SOLN
5000.0000 [IU] | Freq: Three times a day (TID) | INTRAMUSCULAR | Status: DC
  Administered 2020-03-06 – 2020-03-10 (×10): 5000 [IU] via SUBCUTANEOUS
  Filled 2020-03-06 (×11): qty 1

## 2020-03-06 MED ORDER — MORPHINE SULFATE (PF) 2 MG/ML IV SOLN
2.0000 mg | INTRAVENOUS | Status: DC | PRN
  Administered 2020-03-06 – 2020-03-08 (×3): 2 mg via INTRAVENOUS
  Filled 2020-03-06 (×3): qty 1

## 2020-03-06 MED ORDER — ESCITALOPRAM OXALATE 10 MG PO TABS
10.0000 mg | ORAL_TABLET | Freq: Every day | ORAL | Status: DC
  Administered 2020-03-07 – 2020-03-10 (×4): 10 mg via ORAL
  Filled 2020-03-06 (×4): qty 1

## 2020-03-06 MED ORDER — IOHEXOL 300 MG/ML  SOLN
100.0000 mL | Freq: Once | INTRAMUSCULAR | Status: AC | PRN
  Administered 2020-03-06: 100 mL via INTRAVENOUS

## 2020-03-06 MED ORDER — ACETAMINOPHEN 500 MG PO TABS
1000.0000 mg | ORAL_TABLET | Freq: Once | ORAL | Status: AC
  Administered 2020-03-06: 1000 mg via ORAL
  Filled 2020-03-06: qty 2

## 2020-03-06 MED ORDER — SODIUM CHLORIDE 0.9 % IV BOLUS
1000.0000 mL | Freq: Once | INTRAVENOUS | Status: AC
  Administered 2020-03-06: 1000 mL via INTRAVENOUS

## 2020-03-06 MED ORDER — ACETAMINOPHEN 650 MG RE SUPP: 650.0000 mg | Freq: Four times a day (QID) | RECTAL | Status: DC | PRN

## 2020-03-06 MED ORDER — BUSPIRONE HCL 5 MG PO TABS
10.0000 mg | ORAL_TABLET | Freq: Two times a day (BID) | ORAL | Status: DC
  Administered 2020-03-07 – 2020-03-10 (×7): 10 mg via ORAL
  Filled 2020-03-06 (×7): qty 2

## 2020-03-06 MED ORDER — SODIUM CHLORIDE 0.9 % IV SOLN: 1.0000 g | INTRAVENOUS | Status: DC

## 2020-03-06 MED ORDER — ONDANSETRON HCL 4 MG/2ML IJ SOLN
4.0000 mg | Freq: Once | INTRAMUSCULAR | Status: AC
  Administered 2020-03-06: 4 mg via INTRAVENOUS
  Filled 2020-03-06: qty 2

## 2020-03-06 MED ORDER — ACETAMINOPHEN 325 MG PO TABS
650.0000 mg | ORAL_TABLET | Freq: Four times a day (QID) | ORAL | Status: DC | PRN
  Administered 2020-03-07 – 2020-03-10 (×7): 650 mg via ORAL
  Filled 2020-03-06 (×7): qty 2

## 2020-03-06 NOTE — Telephone Encounter (Signed)
Scheduled to see you July 1.  Had tubes placed June 3rd. I can go ahead and request tube change for around July 3rd if that will be okay.

## 2020-03-06 NOTE — Telephone Encounter (Signed)
He needs to see dahlstedt or me in 4 weeks. He needs to continue monthly neph tube changes

## 2020-03-06 NOTE — ED Provider Notes (Addendum)
[facility] Provider Note   CSN: [id number] Arrival date & time: 03/06/20  1524     History Chief Complaint  Patient presents with  . Shortness of Breath    [name] is a 69 y.o. male.  Pt presents to the ED today with not feeling well.  The pt has a hx of metastatic urothelial carcinoma with bony mets.  He had his first dose of carboplatin and gemcitabine on Monday, 6/14.  The pt had bilateral perc nephrostomy tubes placed on 02/22/20.  The pt said he developed a fever today.  He has not taken any meds for his fever other than his nl am meds.  Pt c/o back pain.  He has been fully vaccinated against Covid.        Past Medical History:  Diagnosis Date  . Arterial embolus of lower extremity ([place]) 11/23/2013  . Arthritis   . CAD (coronary artery disease)    a. s/p PCI with 3.5 x 18 BMS to mid RCA, non obst LAD, Lcx patent 08/10/13  . Cancer ([place]) 2021   bladder  . Depression, major, single episode, mild ([place]) 12/20/2019  . Embolus of femoral artery [facility]) 2012   Surgical repair, previously on Coumadin  . Essential hypertension   . ETOH abuse   . History of DVT (deep vein thrombosis) 2011  . History of GI bleed    February 2012   . Ingrown left greater toenail 09/04/2018  . 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 Port-A-Cath in place 02/23/2020  . Prosthetic valve endocarditis ([place])   . PVD (peripheral vascular disease) ([place])   . S/P aortic valve replacement    Details not clear -  reportedly 1971 per patient    Patient Active Problem List   Diagnosis Date Noted  . Port-A-Cath in place 02/23/2020  . Hydrocele 02/21/2020  . Metastatic urothelial carcinoma ([place]) 02/13/2020  . Goals of care, counseling/discussion 02/13/2020  . GI bleed 02/01/2020  . Low oxygen saturation 01/31/2020  . Acute GI bleeding 01/31/2020  . Symptomatic  anemia--acute on chronic secondary to acute blood loss 01/31/2020  . Acute on chronic HFrEF (heart failure with reduced ejection fraction) -combined systolic and diastolic dysfunction CHF 40/04/6760  . Nervously anxious 12/20/2019  . Depression, major, single episode, mild ([place]) 12/20/2019  . Hematuria 10/17/2019  . Pain with urination 10/17/2019  . Weight loss, non-intentional 10/17/2019  . Annual visit for general adult medical examination with abnormal findings 09/12/2019  . Supraclavicular lymphadenopathy 09/12/2019  . Grade 3 out of 6 intensity murmur 09/12/2019  . Bilateral recurrent inguinal hernias 03/08/2019  . Hernia of abdominal cavity 02/27/2019  . Claudication in peripheral vascular disease ([place]) 10/31/2018  . Peripheral arterial disease ([place]) 10/26/2018  . Shortness of breath on exertion 10/16/2017  . Severe aortic insufficiency 10/02/2017  . Allergic rhinitis 12/30/2015  . ED (erectile dysfunction) 03/27/2015  . Hyperlipidemia LDL goal <70 08/25/2013  . Aortic atherosclerosis ([place]) 08/12/2013  . Coronary atherosclerosis of native coronary artery 08/10/2013  . 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 S/P aortic valve replacement   . Essential hypertension, benign 05/13/2010    Past Surgical History:  Procedure Laterality Date  . ABDOMINAL AORTOGRAM W/LOWER EXTREMITY N/A 10/31/2018   Procedure: ABDOMINAL AORTOGRAM W/LOWER EXTREMITY;  Surgeon: Lorretta Harp, MD;  Location: [facility] CV LAB;  Service: Cardiovascular;  Laterality: N/A;  . Aoric valve replacement  January 2005   72mm Medtronic Freestyle stentless porcine bioprosthesis [facility])  . CARDIAC CATHETERIZATION     30-40% mid LAD, 20-30% ostial D1, proximal 30-40% RCA, 95% mid RCA thrombus, 40% distal stenosis. EF 45%, inferior HK.  [name] COLONOSCOPY N/A 04/20/2014   Procedure: COLONOSCOPY;  Surgeon: Rogene Houston, MD;  [facility];  Service: Endoscopy;  Laterality: N/A;  930  . COLONOSCOPY WITH  PROPOFOL N/A 02/02/2020   Procedure: COLONOSCOPY WITH PROPOFOL;  Surgeon: Rogene Houston, MD;  [facility];  Service: Endoscopy;  Laterality: N/A;  . CORONARY ANGIOPLASTY WITH STENT PLACEMENT  07/2013  . ESOPHAGOGASTRODUODENOSCOPY (EGD) WITH PROPOFOL N/A 02/02/2020   Procedure: ESOPHAGOGASTRODUODENOSCOPY (EGD) WITH PROPOFOL;  Surgeon: Rogene Houston, MD;  [facility];  Service: Endoscopy;  Laterality: N/A;  . INGUINAL HERNIA REPAIR Bilateral 04/17/2019   Procedure: ATTEMPTED LAPAROSCOPIC BILATERAL INGUINAL HERNIA REPAIR;  Surgeon: Coralie Keens, MD;  Location: [place];  Service: General;  Laterality: Bilateral;  . INGUINAL HERNIA REPAIR Bilateral 04/17/2019   Procedure: Hernia Repair Inguinal Adult;  Surgeon: Coralie Keens, MD;  Location: [place];  Service: General;  Laterality: Bilateral;  . IR IMAGING GUIDED PORT INSERTION  02/22/2020  . IR NEPHROSTOMY PLACEMENT LEFT  02/22/2020  . IR NEPHROSTOMY PLACEMENT RIGHT  02/22/2020  . LEFT HEART CATHETERIZATION WITH CORONARY ANGIOGRAM N/A 08/10/2013   Procedure: LEFT HEART CATHETERIZATION WITH CORONARY ANGIOGRAM;  Surgeon: Blane Ohara, MD;  Location: [facility] CATH LAB;  Service: Cardiovascular;  Laterality: N/A;  . Left leg embolectomy  May 2012   Dr. Trula Slade - reason for lifelong coumadin  . LYMPH NODE BIOPSY Left 11/21/2019   Procedure: Left Neck LYMPH NODE BIOPSY;  Surgeon: Rozetta Nunnery, MD;  Location: [place];  Service: ENT;  Laterality: Left;  . PERIPHERAL VASCULAR ATHERECTOMY  10/31/2018   Procedure: PERIPHERAL VASCULAR ATHERECTOMY;  Surgeon: Lorretta Harp, MD;  Location: [place] CV LAB;  Service: Cardiovascular;;  left SFA  . POLYPECTOMY  02/02/2020   Procedure: POLYPECTOMY;  Surgeon: Rogene Houston, MD;  [facility];  Service: Endoscopy;;  splenic flexure       Family History  Problem Relation Age of Onset  . Diabetes Mother   . Cirrhosis Father   . COPD Sister     Social History    Tobacco Use  . Smoking status: Former Smoker    Packs/day: 0.50    Years: 3.00    Pack years: 1.50    Types: Cigarettes    Start date: 10/12/1965    Quit date: 02/19/1970    Years since quitting: 50.0  . Smokeless tobacco: Never Used  Vaping Use  . Vaping Use: Never used  Substance Use Topics  . Alcohol use: Not Currently  . Drug use: Yes    Types: Marijuana    Comment: pt smokes weed once in the morning and once in the evening    Home Medications Prior to Admission medications   Medication Sig Start Date End Date Taking? Authorizing Provider  aspirin EC 81 MG tablet Take 1 tablet (81 mg total) by mouth daily. 11/01/17   [name], MD  atorvastatin (LIPITOR) 40 MG tablet Take 1 tablet by mouth once daily 12/21/19   Perlie Mayo, NP  busPIRone (BUSPAR) 10 MG tablet Take 1 tablet (10 mg total) by mouth 2 (two) times daily. 12/20/19   Perlie Mayo, NP  CARBOPLATIN IV Inject into the vein. Days 1, 8 every 21 days 02/26/20   [provider]  cholecalciferol (VITAMIN D) 1000 units tablet Take 1 tablet (1,000 Units total) by mouth daily. 11/01/17   [name], MD  clopidogrel (PLAVIX) 75 MG tablet Take 1 tablet by mouth once daily Patient taking differently: Take 75 mg by mouth daily.  08/29/19   [name], MD  escitalopram (LEXAPRO) 10 MG tablet Take 1 tablet (10 mg total) by mouth daily. 12/20/19   Perlie Mayo, NP  fluticasone (FLONASE) 50 MCG/ACT nasal spray Place 2 sprays into both nostrils daily. 03/13/19   [name], MD  furosemide (LASIX) 40 MG tablet Take 1 tablet by mouth once daily 12/21/19   Perlie Mayo, NP  GEMCITABINE HCL IV Inject 1,000 mg/m2 into the vein. Days 1, 8 every 21 days 02/26/20   [provider]  HYDROcodone-acetaminophen (NORCO/VICODIN) 5-325 MG tablet Take 1 tablet by mouth every 6 (six) hours as needed for moderate pain. Patient not taking: Reported on 03/04/2020 02/22/20   Markus Daft, MD   lidocaine-prilocaine (EMLA) cream Apply a small amount to port a cath site and cover with plastic wrap 1 hour prior to chemotherapy appointments 02/23/20   Derek Jack, MD  lisinopril (ZESTRIL) 20 MG tablet Take 1 tablet by mouth once daily 12/21/19   Perlie Mayo, NP  metoprolol succinate (TOPROL-XL) 50 MG 24 hr tablet TAKE 1 TABLET BY MOUTH ONCE DAILY WITH  OR  IMMEDIATELY  FOLLOWING  A  MEAL 12/21/19   Perlie Mayo, NP  montelukast (SINGULAIR) 10 MG tablet TAKE 1 TABLET BY MOUTH AT BEDTIME 12/21/19   Perlie Mayo, NP  nitroGLYCERIN (NITROSTAT) 0.4 MG SL tablet Place 1 tablet (0.4 mg total) under the tongue every 5 (five) minutes as needed for chest pain. Patient not taking: Reported on 03/04/2020 12/20/19   Lorretta Harp, MD  pantoprazole (PROTONIX) 40 MG tablet Take 1 tablet (40 mg total) by mouth daily before breakfast. 02/03/20   Amin, Jeanella Flattery, MD  prochlorperazine (COMPAZINE) 10 MG tablet Take 1 tablet (10 mg total) by mouth every 6 (six) hours as needed (Nausea or vomiting). Patient not taking: Reported on 03/04/2020 02/23/20   Derek Jack, MD  trolamine salicylate (ASPERCREME) 10 % cream Apply 1 application topically as needed for muscle pain. Patient not taking: Reported on 03/04/2020    [provider]    Allergies    Patient has no known allergies.  Review of Systems   Review of Systems  Constitutional: Positive for fever.  Musculoskeletal: Positive for back pain.  All other systems reviewed and are negative.   Physical Exam Updated Vital Signs BP 116/66 (BP Location: Left Arm)   Pulse 76   Temp (!) 101.5 F (38.6 C) (Oral)   Resp 17   Ht 5\' 4"  (1.626 m)   Wt 54.4 kg   SpO2 93%   BMI 20.60 kg/m   Physical Exam Vitals and nursing note reviewed.  Constitutional:      Appearance: He is well-developed.  HENT:     Head: Normocephalic and atraumatic.     Mouth/Throat:     Mouth: Mucous membranes are moist.     Pharynx: Oropharynx is  clear.  Eyes:     Extraocular Movements: Extraocular movements intact.     Pupils: Pupils are equal, round, and reactive to light.  Cardiovascular:     Rate and Rhythm: Normal rate and regular rhythm.  Pulmonary:     Effort: Pulmonary effort is normal.     Breath sounds: Normal breath sounds.  Abdominal:     General: Bowel sounds are normal.     Palpations: Abdomen is soft.  Musculoskeletal:     Cervical back: Normal range of motion and neck supple.       Back:     Right lower leg: Edema present.     Left lower leg: Edema present.     Comments: Bilateral nephrostomy tubes noted  Skin:    General: Skin is warm.     Capillary Refill: Capillary refill takes less than 2 seconds.  Neurological:     General: No focal deficit present.     Mental Status: He is alert and oriented to person, place, and time.  Psychiatric:        Mood and Affect: Mood normal.        Behavior: Behavior normal.     ED Results / Procedures / Treatments   Labs (all labs ordered are listed, but only abnormal results are displayed) Labs Reviewed  BASIC METABOLIC PANEL - Abnormal; Notable for the following components:      Result Value   Sodium 128 (*)    Chloride 95 (*)    Calcium 7.7 (*)    All other components within normal limits  BRAIN NATRIURETIC PEPTIDE - Abnormal; Notable for the following components:   B Natriuretic Peptide 1,930.0 (*)    All other components within normal limits  CBC WITH DIFFERENTIAL/PLATELET - Abnormal; Notable for the following components:   WBC 16.7 (*)    RBC 3.85 (*)    Hemoglobin 8.7 (*)    HCT 27.8 (*)    MCV 72.2 (*)    MCH 22.6 (*)    RDW 22.5 (*)    Neutro Abs 16.1 (*)    Lymphs Abs 0.4 (*)    Abs Immature Granulocytes 0.10 (*)    All other components within normal limits  URINALYSIS, ROUTINE W REFLEX MICROSCOPIC - Abnormal; Notable for the following components:   APPearance CLOUDY (*)    Hgb urine dipstick LARGE (*)    Protein, ur >=300 (*)     Leukocytes,Ua LARGE (*)    RBC / HPF >50 (*)    WBC, UA >50 (*)    Bacteria, UA FEW (*)    All other components within normal limits  URINALYSIS, ROUTINE W REFLEX MICROSCOPIC - Abnormal; Notable for the following components:   APPearance CLOUDY (*)    Hgb urine dipstick LARGE (*)    Protein, ur 100 (*)    Nitrite POSITIVE (*)    Leukocytes,Ua LARGE (*)    All other components within normal limits  CULTURE, BLOOD (ROUTINE X 2)  CULTURE, BLOOD (ROUTINE X 2)  SARS CORONAVIRUS 2 BY RT PCR (HOSPITAL ORDER, [facility])  URINE CULTURE  URINE CULTURE  LACTIC ACID, PLASMA    EKG EKG Interpretation  Date/Time:  Wednesday March 06 2020 16:59:34 EDT Ventricular Rate:  75 PR Interval:    QRS Duration: 93 QT Interval:  519 QTC Calculation: 580 R Axis:   -67 Text Interpretation: Sinus rhythm Prolonged PR interval LVH with secondary repolarization abnormality Prolonged QT interval No significant change since last tracing Confirmed by Isla Pence [phone]) on 03/06/2020 7:14:41 PM   Radiology DG Chest Port 1 View  Result Date: 03/06/2020 CLINICAL DATA:  Shortness of breath EXAM: PORTABLE CHEST 1 VIEW COMPARISON:  02/01/2020 FINDINGS: Interval placement  of right internal jugular approach Port-A-Cath with distal tip terminating at the level of the distal SVC. Prior aortic valve replacement and ascending thoracic aorta stent graft. Median sternotomy. Stable cardiomegaly. Atherosclerotic calcification of the aortic knob. No focal airspace consolidation, pleural effusion, or pneumothorax. IMPRESSION: Interval placement of right chest port. Otherwise stable exam from prior. No acute cardiopulmonary abnormality. No pneumothorax. Electronically Signed   By: Davina Poke D.O.   On: 03/06/2020 16:54    Procedures Procedures (including critical care time)  Medications Ordered in ED Medications  cefTRIAXone (ROCEPHIN) 1 g in sodium chloride 0.9 % 100 mL IVPB (1 g  Intravenous New Bag/Given 03/06/20 1852)  acetaminophen (TYLENOL) tablet 1,000 mg (1,000 mg Oral Given 03/06/20 1707)  sodium chloride 0.9 % bolus 1,000 mL (0 mLs Intravenous Stopped 03/06/20 1807)  morphine 4 MG/ML injection 4 mg (4 mg Intravenous Given 03/06/20 1707)  ondansetron (ZOFRAN) injection 4 mg (4 mg Intravenous Given 03/06/20 1708)    ED Course  I have reviewed the triage vital signs and the nursing notes.  Pertinent labs & imaging results that were available during my care of the patient were reviewed by me and considered in my medical decision making (see chart for details).    MDM Rules/Calculators/A&P                          Pt does have an infection in his kidney.  The nurse got urine off both nephrostomy tubes and sent them both to the lab.  They have only run 1 of them, and I am not sure which side.   Pt given IVFs, tylenol, and rocephin.  He was d/w Dr. Denton Brick for admission.  Final Clinical Impression(s) / ED Diagnoses Final diagnoses:  Metastatic urothelial carcinoma ([place])  Pyelonephritis  Fever in adult  Chronic anemia    Rx / DC Orders ED Discharge Orders    None       Isla Pence, MD 03/06/20 Elvera Maria, MD 03/06/20 [phone]

## 2020-03-06 NOTE — Progress Notes (Signed)
I received a VM from patient stating that he was having tremors and was very SOB. He wanted advice.  I tried calling patient back and he answered the phone stating that he was on the back of an ambulance and was being transported to [facility].  I spoke with Ander Purpura, EMS and she states that he has calmed down and is no longer trembling, HR 90, BP 150/70, RR 18, 98% on RA, 99.5 oral temp.  She states that he had exhertional wheezing but lungs were clear now that he has calmed some.   I called and gave report to Nira Conn, RN in ER here at [facility].

## 2020-03-06 NOTE — H&P (Addendum)
History and Physical    [name] JQB:[id number] DOB: June 05, 1951 DOA: 03/06/2020  PCP: Bruce Helper, MD   Patient coming from: Home  I have personally briefly reviewed patient's old medical records in [place]  Chief Complaint: Fever  HPI: [name] is a 69 y.o. male with medical history significant for metastatic urothelial cancer, aortic valve replacement, coronary artery disease, diastolic and systolic CHF. Patient presented to the ED with complaints of fever and chills that started this afternoon at about 1 PM.  Reports feeling unwell.  He reports difficulty breathing that has resolved. His bilateral nephrostomy tubes were inserted 6/3, he has had pain from both sides, this pain is unchanged.  No surrounding erythema or discharge. Patient has had increasing bilateral lower extremity swelling since he was diagnosed with bladder cancer.  Reports the swelling is now involving his scrotum. No abdominal pain, no vomiting or loose stools. He denies any surrounding erythema around nephrostomy tubes, no discharge from skin.  Prior to arrival patient was placed on oxygen and given bronchodilator treatments by EMS, on arrival to the ED, his difficulty breathing had resolved.  ED Course: Temperature 101.5, blood pressure systolic 40-973, O2 sats greater than 93% on room air.  Heart rate 70s to 80s.  WBC 16.7.  Hemoglobin down to 8.7.  Sodium 128.  Normal lactic acid 1.5.  BNP elevated at 1930.  Urine samples withdrawn from both nephrostomy tube,-UA showed greater than 50 WBCs, large leukocytes few bacteria. Patient was started on IV ceftriaxone.  Hospitalist to admit for urinary tract infection.  Review of Systems: As per HPI all other systems reviewed and negative.  Past Medical History:  Diagnosis Date  . Arterial embolus of lower extremity ([place]) 11/23/2013  . Arthritis   . CAD (coronary artery disease)    a. s/p PCI with 3.5 x 18 BMS to mid RCA, non obst LAD, Lcx  patent 08/10/13  . Cancer ([place]) 2021   bladder  . Depression, major, single episode, mild ([place]) 12/20/2019  . Embolus of femoral artery [facility]) 2012   Surgical repair, previously on Coumadin  . Essential hypertension   . ETOH abuse   . History of DVT (deep vein thrombosis) 2011  . History of GI bleed    February 2012   . Ingrown left greater toenail 09/04/2018  . 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 Port-A-Cath in place 02/23/2020  . Prosthetic valve endocarditis ([place])   . PVD (peripheral vascular disease) ([place])   . S/P aortic valve replacement    Details not clear -  reportedly 1971 per patient    Past Surgical History:  Procedure Laterality Date  . ABDOMINAL AORTOGRAM W/LOWER EXTREMITY N/A 10/31/2018   Procedure: ABDOMINAL AORTOGRAM W/LOWER EXTREMITY;  Surgeon: Bruce Harp, MD;  Location: [place] CV LAB;  Service: Cardiovascular;  Laterality: N/A;  . Aoric valve replacement  January 2005   52mm Medtronic Freestyle stentless porcine bioprosthesis [facility])  . CARDIAC CATHETERIZATION     30-40% mid LAD, 20-30% ostial D1, proximal 30-40% RCA, 95% mid RCA thrombus, 40% distal stenosis. EF 45%, inferior HK.  [name] COLONOSCOPY N/A 04/20/2014   Procedure: COLONOSCOPY;  Surgeon: Bruce Houston, MD;  [facility];  Service: Endoscopy;  Laterality: N/A;  930  . COLONOSCOPY WITH PROPOFOL N/A 02/02/2020   Procedure: COLONOSCOPY WITH PROPOFOL;  Surgeon: Bruce Houston, MD;  [facility];  Service: Endoscopy;  Laterality: N/A;  . CORONARY ANGIOPLASTY WITH STENT PLACEMENT  07/2013  . ESOPHAGOGASTRODUODENOSCOPY (EGD) WITH PROPOFOL N/A 02/02/2020   Procedure: ESOPHAGOGASTRODUODENOSCOPY (EGD) WITH PROPOFOL;  Surgeon: Bruce Houston, MD;  [facility];  Service: Endoscopy;  Laterality: N/A;  . INGUINAL HERNIA REPAIR Bilateral 04/17/2019    Procedure: ATTEMPTED LAPAROSCOPIC BILATERAL INGUINAL HERNIA REPAIR;  Surgeon: Bruce Keens, MD;  Location: [place];  Service: General;  Laterality: Bilateral;  . INGUINAL HERNIA REPAIR Bilateral 04/17/2019   Procedure: Hernia Repair Inguinal Adult;  Surgeon: Bruce Keens, MD;  Location: [place];  Service: General;  Laterality: Bilateral;  . IR IMAGING GUIDED PORT INSERTION  02/22/2020  . IR NEPHROSTOMY PLACEMENT LEFT  02/22/2020  . IR NEPHROSTOMY PLACEMENT RIGHT  02/22/2020  . LEFT HEART CATHETERIZATION WITH CORONARY ANGIOGRAM N/A 08/10/2013   Procedure: LEFT HEART CATHETERIZATION WITH CORONARY ANGIOGRAM;  Surgeon: Bruce Ohara, MD;  [facility];  Service: Cardiovascular;  Laterality: N/A;  . Left leg embolectomy  May 2012   Dr. Trula Zhang - reason for lifelong coumadin  . LYMPH NODE BIOPSY Left 11/21/2019   Procedure: Left Neck LYMPH NODE BIOPSY;  Surgeon: Bruce Nunnery, MD;  Location: [place];  Service: ENT;  Laterality: Left;  . PERIPHERAL VASCULAR ATHERECTOMY  10/31/2018   Procedure: PERIPHERAL VASCULAR ATHERECTOMY;  Surgeon: Bruce Harp, MD;  Location: [place] CV LAB;  Service: Cardiovascular;;  left SFA  . POLYPECTOMY  02/02/2020   Procedure: POLYPECTOMY;  Surgeon: Bruce Houston, MD;  [facility];  Service: Endoscopy;;  splenic flexure     reports that he quit smoking about 50 years ago. His smoking use included cigarettes. He started smoking about 54 years ago. He has a 1.50 pack-year smoking history. He has never used smokeless tobacco. He reports previous alcohol use. He reports current drug use. Drug: Marijuana.  No Known Allergies  Family History  Problem Relation Age of Onset  . Diabetes Mother   . Cirrhosis Father   . COPD Sister     Prior to Admission medications   Medication Sig Start Date End Date Taking? Authorizing Provider  aspirin EC 81 MG tablet Take 1 tablet (81 mg total) by mouth daily. 11/01/17   Bruce Helper, MD    atorvastatin (LIPITOR) 40 MG tablet Take 1 tablet by mouth once daily 12/21/19   Bruce Mayo, NP  busPIRone (BUSPAR) 10 MG tablet Take 1 tablet (10 mg total) by mouth 2 (two) times daily. 12/20/19   Bruce Mayo, NP  CARBOPLATIN IV Inject into the vein. Days 1, 8 every 21 days 02/26/20   [provider]  cholecalciferol (VITAMIN D) 1000 units tablet Take 1 tablet (1,000 Units total) by mouth daily. 11/01/17   Bruce Helper, MD  clopidogrel (PLAVIX) 75 MG tablet Take 1 tablet by mouth once daily Patient taking differently: Take 75 mg by mouth daily.  08/29/19   Bruce Helper, MD  escitalopram (LEXAPRO) 10 MG tablet Take 1 tablet (10 mg total) by mouth daily. 12/20/19   Bruce Mayo, NP  fluticasone (FLONASE) 50 MCG/ACT nasal spray Place 2 sprays into both nostrils daily. 03/13/19   Bruce Helper, MD  furosemide (LASIX) 40 MG tablet Take 1 tablet by mouth once daily 12/21/19   Bruce Mayo, NP  GEMCITABINE HCL IV Inject 1,000 mg/m2 into the vein. Days 1, 8 every 21 days  02/26/20   [provider]  HYDROcodone-acetaminophen (NORCO/VICODIN) 5-325 MG tablet Take 1 tablet by mouth every 6 (six) hours as needed for moderate pain. Patient not taking: Reported on 03/04/2020 02/22/20   Markus Daft, MD  lidocaine-prilocaine (EMLA) cream Apply a small amount to port a cath site and cover with plastic wrap 1 hour prior to chemotherapy appointments 02/23/20   Derek Jack, MD  lisinopril (ZESTRIL) 20 MG tablet Take 1 tablet by mouth once daily 12/21/19   Bruce Mayo, NP  metoprolol succinate (TOPROL-XL) 50 MG 24 hr tablet TAKE 1 TABLET BY MOUTH ONCE DAILY WITH  OR  IMMEDIATELY  FOLLOWING  A  MEAL 12/21/19   Bruce Mayo, NP  montelukast (SINGULAIR) 10 MG tablet TAKE 1 TABLET BY MOUTH AT BEDTIME 12/21/19   Bruce Mayo, NP  nitroGLYCERIN (NITROSTAT) 0.4 MG SL tablet Place 1 tablet (0.4 mg total) under the tongue every 5 (five) minutes as needed for chest pain. Patient  not taking: Reported on 03/04/2020 12/20/19   Bruce Harp, MD  pantoprazole (PROTONIX) 40 MG tablet Take 1 tablet (40 mg total) by mouth daily before breakfast. 02/03/20   Amin, Jeanella Flattery, MD  prochlorperazine (COMPAZINE) 10 MG tablet Take 1 tablet (10 mg total) by mouth every 6 (six) hours as needed (Nausea or vomiting). Patient not taking: Reported on 03/04/2020 02/23/20   Derek Jack, MD  trolamine salicylate (ASPERCREME) 10 % cream Apply 1 application topically as needed for muscle pain. Patient not taking: Reported on 03/04/2020    [provider]    Physical Exam: Vitals:   03/06/20 1548 03/06/20 1746 03/06/20 1747 03/06/20 1900  BP:    91/65  Pulse:  77 76 74  Resp:  17 17 20   Temp:      TempSrc:      SpO2:  94% 93% 94%  Weight: 54.4 kg     Height: 5\' 4"  (1.626 m)       Constitutional: NAD, calm, comfortable Vitals:   03/06/20 1548 03/06/20 1746 03/06/20 1747 03/06/20 1900  BP:    91/65  Pulse:  77 76 74  Resp:  17 17 20   Temp:      TempSrc:      SpO2:  94% 93% 94%  Weight: 54.4 kg     Height: 5\' 4"  (1.626 m)      Eyes: PERRL, lids and conjunctivae normal ENMT: Mucous membranes are moist.  Neck: normal, supple, no masses, no thyromegaly Respiratory: clear to auscultation bilaterally, no wheezing, no crackles. Normal respiratory effort. No accessory muscle use.  Cardiovascular: Regular rate and rhythm, no murmurs / rubs / gallops.  2+ pitting extremity edema to knees, likely extending to thighs also, patient reports this involves his groin too.  Lower extremities are warm and well-perfused..   Abdomen: no tenderness, no masses palpated. No hepatosplenomegaly. Bowel sounds positive.  Bilateral nephrostomy tubes, clean dressings present.  Musculoskeletal: no clubbing / cyanosis. No joint deformity upper and lower extremities. Good ROM, no contractures. Normal muscle tone.  Skin: no rashes, lesions, ulcers. No induration Neurologic: No apparent cranial  nerve abnormality, moving all extremities spontaneously. Psychiatric: Normal judgment and insight. Alert and oriented x 3. Normal mood.   Labs on Admission: I have personally reviewed following labs and imaging studies  CBC: Recent Labs  Lab 03/04/20 0948 03/06/20 1633  WBC 10.4 16.7*  NEUTROABS 8.0* 16.1*  HGB 9.2* 8.7*  HCT 29.5* 27.8*  MCV 73.2* 72.2*  PLT 430* 372  Basic Metabolic Panel: Recent Labs  Lab 03/04/20 0948 03/06/20 1633  NA 128* 128*  K 3.9 4.7  CL 94* 95*  CO2 24 23  GLUCOSE 92 89  BUN 10 23  CREATININE 0.82 0.97  CALCIUM 8.1* 7.7*  MG 1.8  --    Liver Function Tests: Recent Labs  Lab 03/04/20 0948  AST 16  ALT 13  ALKPHOS 169*  BILITOT 0.7  PROT 6.8  ALBUMIN 3.0*   Urine analysis:    Component Value Date/Time   COLORURINE YELLOW 03/06/2020 1628   APPEARANCEUR CLOUDY (A) 03/06/2020 1628   LABSPEC 1.014 03/06/2020 1628   PHURINE 6.0 03/06/2020 1628   GLUCOSEU NEGATIVE 03/06/2020 1628   GLUCOSEU 100 (A) 07/08/2010 1902   HGBUR LARGE (A) 03/06/2020 1628   HGBUR small 07/10/2010 1004   BILIRUBINUR NEGATIVE 03/06/2020 1628   BILIRUBINUR neg 02/21/2020 0920   KETONESUR NEGATIVE 03/06/2020 1628   PROTEINUR 100 (A) 03/06/2020 1628   UROBILINOGEN negative (A) 02/21/2020 0920   UROBILINOGEN 0.2 02/05/2011 1016   NITRITE POSITIVE (A) 03/06/2020 1628   LEUKOCYTESUR LARGE (A) 03/06/2020 1628    Radiological Exams on Admission: DG Chest Port 1 View  Result Date: 03/06/2020 CLINICAL DATA:  Shortness of breath EXAM: PORTABLE CHEST 1 VIEW COMPARISON:  02/01/2020 FINDINGS: Interval placement of right internal jugular approach Port-A-Cath with distal tip terminating at the level of the distal SVC. Prior aortic valve replacement and ascending thoracic aorta stent graft. Median sternotomy. Stable cardiomegaly. Atherosclerotic calcification of the aortic knob. No focal airspace consolidation, pleural effusion, or pneumothorax. IMPRESSION: Interval  placement of right chest port. Otherwise stable exam from prior. No acute cardiopulmonary abnormality. No pneumothorax. Electronically Signed   By: Davina Poke D.O.   On: 03/06/2020 16:54    EKG: Independently reviewed.  Sinus rhythm rate 75, PR interval appears borderline prolonged, prolonged QTC 580.  Old ST T wave abnormalities V2, through V6,   Assessment/Plan Principal Problem:   UTI (urinary tract infection) Active Problems:   Essential hypertension, benign   S/P aortic valve replacement   Coronary atherosclerosis of native coronary artery   Depression, major, single episode, mild (HCC)   Acute on chronic HFrEF (heart failure with reduced ejection fraction) -combined systolic and diastolic dysfunction CHF   Metastatic urothelial carcinoma (HCC)   Port-A-Cath in place    Urinary tract infection-in this patient with bilateral nephrostomy tubes actively on chemotherapy for metastatic bladder cancer.  Rules in for sepsis, with fever of 101.5, leukocytosis of 16.7, but with normal lactic acid of 1.5.  UA with large leukocytes, greater than 50 WBCs, few bacteria large hemoglobin.  Covid test negative.  Completed Covid vaccine.  Portable chest x-ray unremarkable.  Differentials for sepsis also includes bacteremia, as he recently started chemotherapy via right chest port.  His initial dyspnea and wheezing resolved prior to arrival in the ED after bronchodilator treatment. -Will obtain CT abdomen pelvis with contrast -Continue IV ceftriaxone -Follow-up urine and blood cultures. -If blood cultures are positive, his Port-A-Cath will need to be evaluated. -1000 mill bolus normal saline given.  Hold off of further IV fluids for now. -BMP, CBC a.m.  Metastatic urothelial cancer with bilateral nephrostomy tubes-follows with Dr. Delton Coombes, started on chemotherapy via chest port 6/14.  Nephrostomy tubes were placed 02/22/2020 by IR, for bilateral hydronephrosis secondary to bilateral ureteral  obstruction from tumor burden.  Patient was to follow-up with urology. -Urology consult -Follow up CT abdomen and pelvis -N.p.o. midnight pending CT scan results and  possibly urology evaluation  Decompensated combined diastolic and systolic heart failure-bilateral extremity edema to groin, BNP elevated at 1930, chest x-ray without acute abnormality.  Reports compliance with Lasix.  Last echo 12/2019 EF 45 to 50%, G1DD. -Lasix held at this time in the setting of sepsis and soft blood pressure systolic 41C. -Would benefit from diuresis at some point  Chronic anemia-hemoglobin 8.7, recent baseline over the past month 8-10.  RBC indices and recent ferritin 5, iron saturation 3, with normal folic acid and [id number] iron deficiency.  01/2020 upper endoscopy done for unexplained iron deficiency- Grade a reflux esophagitis. Colonoscopy- diverticulosis and external hemorrhoids.  Likely related to bladder mass.  Started on weekly Feraheme by Dr. Delton Coombes. -CBC a.m.  Hypertension-Systolic 97-282. -Hold Lasix, lisinopril for now - Pending med rec Resume metoprolol 50 mg daily   Prolonged QTC-580.  Potassium 4.7.  Buspirone and Lexapro. -  Check magnesium - Repeat EKG in the morning  Aortic valve replacement.  History of coronary artery disease- PCI 2018.  No chest pain.  EKG without changes. -Aspirin, Plavix held for now pending urology evaluation if procedure will be needed.   DVT prophylaxis: Heparin Code Status: Full code Family Communication: None at bedside Disposition Plan: > 2 days, likely discharge to home, pending blood and urine cultures, urology evaluation. Consults called: Urology. Admission status: Inpatient, telemetry I certify that at the point of admission it is my clinical judgment that the patient will require inpatient hospital care spanning beyond 2 midnights from the point of admission due to high intensity of service, high risk for further deterioration and high  frequency of surveillance required. The following factors support the patient status of inpatient: Sepsis from UTI possible bacteremia requiring IV antibiotics pending cultures.   Bethena Roys MD [facility]  03/06/2020, 9:07 PM

## 2020-03-06 NOTE — ED Triage Notes (Signed)
Pt brought in by [facility] for c/o sob that started today; pt states he feels sob that started this afternoon; pt states he started shaking and trembling; pt is a stage 4 kidney cancer pt and received his first dose of chemotherapy x 2 days ago; he called the cancer center and the nurse told pt to come to ED to be evaluated; cbg with ems was 116; temp with ems was 99.5

## 2020-03-06 NOTE — ED Notes (Signed)
Pt given meal tray.

## 2020-03-07 ENCOUNTER — Encounter (HOSPITAL_COMMUNITY): Payer: Self-pay | Admitting: Hematology

## 2020-03-07 DIAGNOSIS — D649 Anemia, unspecified: Secondary | ICD-10-CM

## 2020-03-07 DIAGNOSIS — R509 Fever, unspecified: Secondary | ICD-10-CM

## 2020-03-07 DIAGNOSIS — R319 Hematuria, unspecified: Secondary | ICD-10-CM

## 2020-03-07 DIAGNOSIS — C7910 Secondary malignant neoplasm of unspecified urinary organs: Secondary | ICD-10-CM

## 2020-03-07 DIAGNOSIS — I5023 Acute on chronic systolic (congestive) heart failure: Secondary | ICD-10-CM

## 2020-03-07 DIAGNOSIS — N390 Urinary tract infection, site not specified: Secondary | ICD-10-CM

## 2020-03-07 LAB — CBC
HCT: 25.1 % — ABNORMAL LOW (ref 39.0–52.0)
Hemoglobin: 7.8 g/dL — ABNORMAL LOW (ref 13.0–17.0)
MCH: 22.5 pg — ABNORMAL LOW (ref 26.0–34.0)
MCHC: 31.1 g/dL (ref 30.0–36.0)
MCV: 72.5 fL — ABNORMAL LOW (ref 80.0–100.0)
Platelets: 315 10*3/uL (ref 150–400)
RBC: 3.46 MIL/uL — ABNORMAL LOW (ref 4.22–5.81)
RDW: 22.3 % — ABNORMAL HIGH (ref 11.5–15.5)
WBC: 12.6 10*3/uL — ABNORMAL HIGH (ref 4.0–10.5)
nRBC: 0 % (ref 0.0–0.2)

## 2020-03-07 LAB — BASIC METABOLIC PANEL
Anion gap: 8 (ref 5–15)
BUN: 24 mg/dL — ABNORMAL HIGH (ref 8–23)
CO2: 23 mmol/L (ref 22–32)
Calcium: 7.4 mg/dL — ABNORMAL LOW (ref 8.9–10.3)
Chloride: 97 mmol/L — ABNORMAL LOW (ref 98–111)
Creatinine, Ser: 1 mg/dL (ref 0.61–1.24)
GFR calc Af Amer: >60 mL/min (ref >60)
GFR calc non Af Amer: >60 mL/min (ref >60)
Glucose, Bld: 99 mg/dL (ref 70–99)
Potassium: 4.6 mmol/L (ref 3.5–5.1)
Sodium: 128 mmol/L — ABNORMAL LOW (ref 135–145)

## 2020-03-07 LAB — LACTIC ACID, PLASMA: Lactic Acid, Venous: 0.9 mmol/L (ref 0.5–1.9)

## 2020-03-07 MED ORDER — FUROSEMIDE 10 MG/ML IJ SOLN
40.0000 mg | Freq: Two times a day (BID) | INTRAMUSCULAR | Status: DC
  Administered 2020-03-07 – 2020-03-09 (×6): 40 mg via INTRAVENOUS
  Filled 2020-03-07 (×6): qty 4

## 2020-03-07 MED ORDER — POLYETHYLENE GLYCOL 3350 17 G PO PACK
17.0000 g | PACK | Freq: Every day | ORAL | Status: DC
  Administered 2020-03-07 – 2020-03-10 (×4): 17 g via ORAL
  Filled 2020-03-07 (×4): qty 1

## 2020-03-07 MED ORDER — CHLORHEXIDINE GLUCONATE CLOTH 2 % EX PADS
6.0000 | MEDICATED_PAD | Freq: Every day | CUTANEOUS | Status: DC
  Administered 2020-03-07 – 2020-03-08 (×2): 6 via TOPICAL

## 2020-03-07 MED ORDER — PANTOPRAZOLE SODIUM 40 MG PO TBEC
40.0000 mg | DELAYED_RELEASE_TABLET | Freq: Every day | ORAL | Status: DC
  Administered 2020-03-08 – 2020-03-10 (×3): 40 mg via ORAL
  Filled 2020-03-07 (×3): qty 1

## 2020-03-07 MED ORDER — SODIUM CHLORIDE 0.9 % IV SOLN
2.0000 g | INTRAVENOUS | Status: DC
  Administered 2020-03-07: 2 g via INTRAVENOUS
  Filled 2020-03-07: qty 20

## 2020-03-07 MED ORDER — ATORVASTATIN CALCIUM 40 MG PO TABS
40.0000 mg | ORAL_TABLET | Freq: Every evening | ORAL | Status: DC
  Administered 2020-03-07 – 2020-03-09 (×3): 40 mg via ORAL
  Filled 2020-03-07 (×3): qty 1

## 2020-03-07 MED ORDER — ASPIRIN EC 81 MG PO TBEC
81.0000 mg | DELAYED_RELEASE_TABLET | Freq: Every day | ORAL | Status: DC
  Administered 2020-03-07 – 2020-03-10 (×4): 81 mg via ORAL
  Filled 2020-03-07 (×4): qty 1

## 2020-03-07 NOTE — Consult Note (Signed)
[facility] Consultation Oncology  Name: [name]      MRN: [id number]    Location: A320/A320-01  Date: 03/07/2020 Time:6:29 PM   REFERRING PHYSICIAN: Dr. Manuella Ghazi  REASON FOR CONSULT: Metastatic bladder cancer   DIAGNOSIS: Sepsis due to UTI  HISTORY OF PRESENT ILLNESS: [name] is a 68 year old very pleasant African-American male who is seen in consultation today for further management of metastatic bladder cancer, admission from UTI after cycle 1 of chemotherapy.  He received his first cycle of carboplatin and gemcitabine on 03/04/2020.  On 03/06/2020 he started having fevers and chills.  He called 911 and was brought to the emergency room.  He was found to have a fever of 101.5.  Urinalysis was positive.  White count was elevated.  He was admitted to the hospital and antibiotic was started.  CT abdomen and pelvis done in the ER showed large amount of tumor eccentric to the right of the urinary bladder.  Mild retroperitoneal and bilateral external iliac adenopathy.  He started on Rocephin.  He was also found to have elevated BNP.  Denied any chemotherapy related side effects including nausea or vomiting or diarrhea.  PAST MEDICAL HISTORY:   Past Medical History:  Diagnosis Date  . Arterial embolus of lower extremity ([place]) 11/23/2013  . Arthritis   . CAD (coronary artery disease)    a. s/p PCI with 3.5 x 18 BMS to mid RCA, non obst LAD, Lcx patent 08/10/13  . Cancer ([place]) 2021   bladder  . Depression, major, single episode, mild ([place]) 12/20/2019  . Embolus of femoral artery [facility]) 2012   Surgical repair, previously on Coumadin  . Essential hypertension   . ETOH abuse   . History of DVT (deep vein thrombosis) 2011  . History of GI bleed    February 2012   . Ingrown left greater toenail 09/04/2018  . 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 Port-A-Cath in place  02/23/2020  . Prosthetic valve endocarditis ([place])   . PVD (peripheral vascular disease) ([place])   . S/P aortic valve replacement    Details not clear -  reportedly 1971 per patient    ALLERGIES: No Known Allergies    MEDICATIONS: I have reviewed the patient's current medications.     PAST SURGICAL HISTORY Past Surgical History:  Procedure Laterality Date  . ABDOMINAL AORTOGRAM W/LOWER EXTREMITY N/A 10/31/2018   Procedure: ABDOMINAL AORTOGRAM W/LOWER EXTREMITY;  Surgeon: Lorretta Harp, MD;  Location: [place] CV LAB;  Service: Cardiovascular;  Laterality: N/A;  . Aoric valve replacement  January 2005   33mm Medtronic Freestyle stentless porcine bioprosthesis [facility])  . CARDIAC CATHETERIZATION     30-40% mid LAD, 20-30% ostial D1, proximal 30-40% RCA, 95% mid RCA thrombus, 40% distal stenosis. EF 45%, inferior HK.  [name] COLONOSCOPY N/A 04/20/2014   Procedure: COLONOSCOPY;  Surgeon: Rogene Houston, MD;  [facility];  Service: Endoscopy;  Laterality: N/A;  930  . COLONOSCOPY WITH PROPOFOL N/A 02/02/2020   Procedure: COLONOSCOPY WITH PROPOFOL;  Surgeon: Rogene Houston, MD;  [facility];  Service: Endoscopy;  Laterality: N/A;  . CORONARY ANGIOPLASTY WITH STENT PLACEMENT  07/2013  . ESOPHAGOGASTRODUODENOSCOPY (EGD) WITH PROPOFOL N/A 02/02/2020   Procedure: ESOPHAGOGASTRODUODENOSCOPY (EGD) WITH PROPOFOL;  Surgeon: Rogene Houston, MD;  [facility];  Service: Endoscopy;  Laterality: N/A;  . INGUINAL HERNIA REPAIR Bilateral 04/17/2019   Procedure: ATTEMPTED LAPAROSCOPIC BILATERAL INGUINAL HERNIA REPAIR;  Surgeon: Coralie Keens, MD;  Location: [place];  Service: General;  Laterality: Bilateral;  . INGUINAL HERNIA REPAIR Bilateral 04/17/2019   Procedure: Hernia Repair Inguinal Adult;  Surgeon: Coralie Keens, MD;  Location: [place];  Service: General;  Laterality: Bilateral;  . IR IMAGING GUIDED PORT INSERTION  02/22/2020  . IR NEPHROSTOMY PLACEMENT LEFT   02/22/2020  . IR NEPHROSTOMY PLACEMENT RIGHT  02/22/2020  . LEFT HEART CATHETERIZATION WITH CORONARY ANGIOGRAM N/A 08/10/2013   Procedure: LEFT HEART CATHETERIZATION WITH CORONARY ANGIOGRAM;  Surgeon: Blane Ohara, MD;  Location: [facility] CATH LAB;  Service: Cardiovascular;  Laterality: N/A;  . Left leg embolectomy  May 2012   Dr. Trula Slade - reason for lifelong coumadin  . LYMPH NODE BIOPSY Left 11/21/2019   Procedure: Left Neck LYMPH NODE BIOPSY;  Surgeon: Rozetta Nunnery, MD;  Location: [place];  Service: ENT;  Laterality: Left;  . PERIPHERAL VASCULAR ATHERECTOMY  10/31/2018   Procedure: PERIPHERAL VASCULAR ATHERECTOMY;  Surgeon: Lorretta Harp, MD;  Location: [place] CV LAB;  Service: Cardiovascular;;  left SFA  . POLYPECTOMY  02/02/2020   Procedure: POLYPECTOMY;  Surgeon: Rogene Houston, MD;  [facility];  Service: Endoscopy;;  splenic flexure    FAMILY HISTORY: Family History  Problem Relation Age of Onset  . Diabetes Mother   . Cirrhosis Father   . COPD Sister     SOCIAL HISTORY:  reports that he quit smoking about 50 years ago. His smoking use included cigarettes. He started smoking about 54 years ago. He has a 1.50 pack-year smoking history. He has never used smokeless tobacco. He reports previous alcohol use. He reports current drug use. Drug: Marijuana.  PERFORMANCE STATUS: The patient's performance status is 2 - Symptomatic, <50% confined to bed  PHYSICAL EXAM: Most Recent Vital Signs: Blood pressure 116/69, pulse 79, temperature 97.6 F (36.4 C), temperature source Oral, resp. rate 18, height 5\' 4"  (1.626 m), weight 135 lb 12.9 oz (61.6 kg), SpO2 100 %. BP 116/69 (BP Location: Left Arm)   Pulse 79   Temp 97.6 F (36.4 C) (Oral)   Resp 18   Ht 5\' 4"  (1.626 m)   Wt 135 lb 12.9 oz (61.6 kg)   SpO2 100%   BMI 23.31 kg/m  General appearance: alert and cooperative Head: Normocephalic, without obvious abnormality, atraumatic Lungs: Bilateral air  entry. Heart: regular rate and rhythm Abdomen: Soft, nontender with no palpable masses. Extremities: Right lower extremity edema present. Skin: Skin color, texture, turgor normal. No rashes or lesions Neurologic: Grossly normal  LABORATORY DATA:  Results for orders placed or performed during the hospital encounter of 03/06/20 (from the past 48 hour(s))  Urinalysis, Routine w reflex microscopic     Status: Abnormal   Collection Time: 03/06/20  4:20 PM  Result Value Ref Range   Color, Urine YELLOW YELLOW   APPearance CLOUDY (A) CLEAR   Specific Gravity, Urine 1.012 1.005 - 1.030   pH 6.0 5.0 - 8.0   Glucose, UA NEGATIVE NEGATIVE mg/dL   Hgb urine dipstick LARGE (A) NEGATIVE   Bilirubin Urine NEGATIVE NEGATIVE   Ketones, ur NEGATIVE NEGATIVE mg/dL   Protein, ur >=300 (A) NEGATIVE mg/dL   Nitrite NEGATIVE NEGATIVE   Leukocytes,Ua LARGE (A) NEGATIVE   RBC / HPF >50 (H) 0 - 5 RBC/hpf   WBC, UA >50 (H) 0 - 5 WBC/hpf  Bacteria, UA FEW (A) NONE SEEN   WBC Clumps PRESENT    Mucus PRESENT     Comment: Performed at [facility], [address], [place], [place] [address]  Urine culture     Status: None (Preliminary result)   Collection Time: 03/06/20  4:20 PM   Specimen: Urine, Catheterized  Result Value Ref Range   Specimen Description      URINE, CATHETERIZED Performed at [facility], [address] [place], [place] [address]    Special Requests      RIGHT NEPHROSTOMY Performed at [facility], [address] [place], [place] [address]    Culture      CULTURE REINCUBATED FOR BETTER GROWTH Performed at [facility], [place] [address] [place], [place] [address]    Report Status PENDING   SARS Coronavirus 2 by RT PCR (hospital order, performed in [facility] hospital lab) Nasopharyngeal Nasopharyngeal Swab     Status: None   Collection Time: 03/06/20  4:25 PM   Specimen: Nasopharyngeal Swab  Result Value Ref Range   SARS Coronavirus 2 NEGATIVE NEGATIVE    Comment:  (NOTE) SARS-CoV-2 target nucleic acids are NOT DETECTED.  The SARS-CoV-2 RNA is generally detectable in upper and lower respiratory specimens during the acute phase of infection. The lowest concentration of SARS-CoV-2 viral copies this assay can detect is 250 copies / mL. A negative result does not preclude SARS-CoV-2 infection and should not be used as the sole basis for treatment or other patient management decisions.  A negative result may occur with improper specimen collection / handling, submission of specimen other than nasopharyngeal swab, presence of viral mutation(s) within the areas targeted by this assay, and inadequate number of viral copies (<250 copies / mL). A negative result must be combined with clinical observations, patient history, and epidemiological information.  Fact Sheet for Patients:   StrictlyIdeas.no  Fact Sheet for Healthcare Providers: BankingDealers.co.za  This test is not yet approved or  cleared by the [place] FDA and has been authorized for detection and/or diagnosis of SARS-CoV-2 by FDA under an Emergency Use Authorization (EUA).  This EUA will remain in effect (meaning this test can be used) for the duration of the COVID-19 declaration under Section 564(b)(1) of the Act, 21 U.S.C. section 360bbb-3(b)(1), unless the authorization is terminated or revoked sooner.  Performed at [facility], [address], [place], [place] [address]   Urinalysis, Routine w reflex microscopic     Status: Abnormal   Collection Time: 03/06/20  4:28 PM  Result Value Ref Range   Color, Urine YELLOW YELLOW   APPearance CLOUDY (A) CLEAR   Specific Gravity, Urine 1.014 1.005 - 1.030   pH 6.0 5.0 - 8.0   Glucose, UA NEGATIVE NEGATIVE mg/dL   Hgb urine dipstick LARGE (A) NEGATIVE   Bilirubin Urine NEGATIVE NEGATIVE   Ketones, ur NEGATIVE NEGATIVE mg/dL   Protein, ur 100 (A) NEGATIVE mg/dL   Nitrite POSITIVE (A)  NEGATIVE   Leukocytes,Ua LARGE (A) NEGATIVE    Comment: Performed at [facility], [address], [place], [place] [address]  Urine culture     Status: None (Preliminary result)   Collection Time: 03/06/20  4:28 PM   Specimen: Urine, Catheterized  Result Value Ref Range   Specimen Description      URINE, CATHETERIZED Performed at [facility], [address] [place], [place] [address]    Special Requests      LEFT NEPHROSTOMY TUBE Performed at [facility],  [address] [place], [place] [address]    Culture      CULTURE REINCUBATED FOR BETTER GROWTH Performed at [facility], [place] [address] [place], [place] [address]    Report Status PENDING   Basic metabolic panel     Status: Abnormal   Collection Time: 03/06/20  4:33 PM  Result Value Ref Range   Sodium 128 (L) 135 - 145 mmol/L   Potassium 4.7 3.5 - 5.1 mmol/L   Chloride 95 (L) 98 - 111 mmol/L   CO2 23 22 - 32 mmol/L   Glucose, Bld 89 70 - 99 mg/dL    Comment: Glucose reference range applies only to samples taken after fasting for at least 8 hours.   BUN 23 8 - 23 mg/dL   Creatinine, Ser 0.97 0.61 - 1.24 mg/dL   Calcium 7.7 (L) 8.9 - 10.3 mg/dL   GFR calc non Af Amer >60 >60 mL/min   GFR calc Af Amer >60 >60 mL/min   Anion gap 10 5 - 15    Comment: Performed at [facility], [address], [place], [place] [address]  Brain natriuretic peptide     Status: Abnormal   Collection Time: 03/06/20  4:33 PM  Result Value Ref Range   B Natriuretic Peptide 1,930.0 (H) 0.0 - 100.0 pg/mL    Comment: Performed at [facility], [address] [place], [place] [address]  CBC with Differential     Status: Abnormal   Collection Time: 03/06/20  4:33 PM  Result Value Ref Range   WBC 16.7 (H) 4.0 - 10.5 K/uL   RBC 3.85 (L) 4.22 - 5.81 MIL/uL   Hemoglobin 8.7 (L) 13.0 - 17.0 g/dL    Comment: Reticulocyte Hemoglobin testing may be clinically indicated, consider ordering this additional test [id number]    HCT 27.8 (L) 39 - 52 %    MCV 72.2 (L) 80.0 - 100.0 fL   MCH 22.6 (L) 26.0 - 34.0 pg   MCHC 31.3 30.0 - 36.0 g/dL   RDW 22.5 (H) 11.5 - 15.5 %   Platelets 372 150 - 400 K/uL   nRBC 0.0 0.0 - 0.2 %   Neutrophils Relative % 96 %   Neutro Abs 16.1 (H) 1.7 - 7.7 K/uL   Lymphocytes Relative 2 %   Lymphs Abs 0.4 (L) 0.7 - 4.0 K/uL   Monocytes Relative 1 %   Monocytes Absolute 0.1 0 - 1 K/uL   Eosinophils Relative 0 %   Eosinophils Absolute 0.0 0 - 0 K/uL   Basophils Relative 0 %   Basophils Absolute 0.0 0 - 0 K/uL   Immature Granulocytes 1 %   Abs Immature Granulocytes 0.10 (H) 0.00 - 0.07 K/uL    Comment: Performed at [facility], [address], [place], [place] [address]  Lactic acid, plasma     Status: None   Collection Time: 03/06/20  4:33 PM  Result Value Ref Range   Lactic Acid, Venous 1.5 0.5 - 1.9 mmol/L    Comment: Performed at [facility], [address], [place], [place] [address]  Magnesium     Status: None   Collection Time: 03/06/20  4:33 PM  Result Value Ref Range   Magnesium 1.9 1.7 - 2.4 mg/dL    Comment: Performed at [facility], [address], [place],  [address]  Culture, blood (routine x 2)     Status: None (Preliminary result)   Collection Time: 03/06/20  4:34 PM   Specimen: Left Antecubital;  Blood  Result Value Ref Range   Specimen Description LEFT ANTECUBITAL    Special Requests      BOTTLES DRAWN AEROBIC AND ANAEROBIC Blood Culture adequate volume   Culture      NO GROWTH < 24 HOURS Performed at [facility], [address], [place], [place] [address]    Report Status PENDING   Culture, blood (routine x 2)     Status: None (Preliminary result)   Collection Time: 03/06/20  4:39 PM   Specimen: Left Antecubital; Blood  Result Value Ref Range   Specimen Description LEFT ANTECUBITAL    Special Requests      BOTTLES DRAWN AEROBIC AND ANAEROBIC Blood Culture adequate volume   Culture      NO GROWTH < 24 HOURS Performed at [facility], [address] [place],  [place] [address]    Report Status PENDING   Basic metabolic panel     Status: Abnormal   Collection Time: 03/07/20  5:18 AM  Result Value Ref Range   Sodium 128 (L) 135 - 145 mmol/L   Potassium 4.6 3.5 - 5.1 mmol/L   Chloride 97 (L) 98 - 111 mmol/L   CO2 23 22 - 32 mmol/L   Glucose, Bld 99 70 - 99 mg/dL    Comment: Glucose reference range applies only to samples taken after fasting for at least 8 hours.   BUN 24 (H) 8 - 23 mg/dL   Creatinine, Ser 1.00 0.61 - 1.24 mg/dL   Calcium 7.4 (L) 8.9 - 10.3 mg/dL   GFR calc non Af Amer >60 >60 mL/min   GFR calc Af Amer >60 >60 mL/min   Anion gap 8 5 - 15    Comment: Performed at [facility], [address], [place], [place] [address]  CBC     Status: Abnormal   Collection Time: 03/07/20  5:18 AM  Result Value Ref Range   WBC 12.6 (H) 4.0 - 10.5 K/uL   RBC 3.46 (L) 4.22 - 5.81 MIL/uL   Hemoglobin 7.8 (L) 13.0 - 17.0 g/dL    Comment: Reticulocyte Hemoglobin testing may be clinically indicated, consider ordering this additional test [id number]    HCT 25.1 (L) 39 - 52 %   MCV 72.5 (L) 80.0 - 100.0 fL   MCH 22.5 (L) 26.0 - 34.0 pg   MCHC 31.1 30.0 - 36.0 g/dL   RDW 22.3 (H) 11.5 - 15.5 %   Platelets 315 150 - 400 K/uL   nRBC 0.0 0.0 - 0.2 %    Comment: Performed at [facility], [address], [place], [place] [address]  Lactic acid, plasma     Status: None   Collection Time: 03/07/20  7:49 AM  Result Value Ref Range   Lactic Acid, Venous 0.9 0.5 - 1.9 mmol/L    Comment: Performed at [facility], [address], [place], [place] [address]      RADIOGRAPHY: CT ABDOMEN PELVIS W CONTRAST  Result Date: 03/06/2020 CLINICAL DATA:  Fever and leukocytosis. Metastatic urothelial cancer. Recent initiation of carboplatin and gemcitabine therapy on 03/04/2020. Back pain and fever. EXAM: CT ABDOMEN AND PELVIS WITH CONTRAST TECHNIQUE: Multidetector CT imaging of the abdomen and pelvis was performed using the standard protocol following bolus administration  of intravenous contrast. CONTRAST:  174mL OMNIPAQUE IOHEXOL 300 MG/ML  SOLN COMPARISON:  02/02/2020 FINDINGS: Lower chest: Moderate cardiomegaly with right coronary artery atherosclerosis. Prosthetic aortic valve. Mild scarring or atelectasis in the posterior basal segment right lower lobe. Hepatobiliary: Embolization  coils along the porta hepatis. No discrete hepatic mass is identified. Gallbladder unremarkable. Pancreas: Unremarkable Spleen: Lobular spleen, unchanged from prior. Adrenals/Urinary Tract: Adrenal glands unremarkable. Complex lesion along the right kidney upper pole measuring 2.3 by 1.5 cm, internal density approximately 70 Hounsfield units, probably a complex cyst and not appreciably changed from 02/02/2020. Prior PET-CT negative in this vicinity suggesting likely benign nature. Scarring in the right mid kidney. Scarring in the left kidney upper pole. Bilateral percutaneous nephrostomy tubes with coils formed in the renal pelvis bilaterally. No significant hydronephrosis. There is a large amount of tumor eccentric to the right in the urinary bladder. I cannot exclude invasion of the right seminal vesicle by tumor based on the current appearance. Dominant tumor in the right bladder measures about 8.4 by 4.5 cm on image 56/2. There is likely some tumor along the bladder base as well. Perivesical nodularity including a 1.0 cm soft tissue nodule adjacent to the right bladder tumor on image 54/5. There loops of bowel adjacent to the bladder tumor, without definite invasion of bowel by the tumor. Stomach/Bowel: Prominent stool throughout the colon favors constipation. No dilated small bowel. Vascular/Lymphatic: Aortoiliac atherosclerotic vascular disease. Left periaortic node 1.1 cm in short axis on image 21/2, previously 1.4 cm. Right external iliac node partially encasing the right external iliac artery, 3.7 cm in short axis on image 50/2, previously 3.0 cm. Left pelvic sidewall lymph node 1.1 cm in  short axis on image 56/2, previously the same. Other bilateral external iliac lymph nodes are mildly enlarged and there are smaller inguinal lymph nodes noted. Reproductive: Prostatomegaly. As noted above, I cannot exclude bladder tumor invading back into the right seminal vesicle given the appearance for example on image 52/6. Other: Subcutaneous edema along the anterior abdominal wall extending down into the scrotum and penis which appear expanded by infiltrative edema. This edema also extends into the upper thigh region, right greater than left. Musculoskeletal: Scattered osseous metastatic disease including visualized ribs, lumbar spine, bony pelvis, and inter trochanteric region of the right proximal femur. The sclerotic metastatic lesions are increased in size and number. An index lesion in the left inferior pubic ramus measures 2.0 by 1.1 cm, formerly 0.7 by 0.7 cm. Grade 1 degenerative anterolisthesis at L4-5 with bilateral resulting foraminal stenosis due to disc uncovering and facet arthropathy. Facet arthropathy also contributes to bilateral foraminal impingement at L5-S1. There is endplate sclerosis at D9-7 along with loss of disc height which is stable from the prior exam. There is central narrowing of the thecal sac at the L4-5 level not appreciably changed from 02/02/2020. I do not see compelling evidence of enhancing tumor in the epidural space although MRI is of course more sensitive. I not see a definite pathologic fracture. IMPRESSION: 1. Large amount of tumor eccentric to the right in the urinary bladder with some adjacent nodularity. I cannot exclude invasion of the right seminal vesicle by tumor. Mild retroperitoneal and bilateral external iliac adenopathy. Moderately worsened osseous metastatic disease. 2. Subcutaneous edema along the anterior abdominal wall extending down into the scrotum and penis which appear expanded by infiltrative edema. This also extends into the upper thigh region,  right greater than left. It is quite possible that the large right external iliac node measuring 3.7 cm in short axis is causing extrinsic compression on the right external iliac vein which may be contributing to the asymmetry of subcutaneous edema in the right upper thigh. 3. Other imaging findings of potential clinical significance: Moderate cardiomegaly with  right coronary artery atherosclerosis. Bilateral percutaneous nephrostomy tubes without significant hydronephrosis. Prominent stool throughout the colon favors constipation. Complex lesion along the right kidney upper pole, probably a complex cyst given lack of PET activity, and not appreciably changed from 02/02/2020. Lumbar spondylosis and degenerative disc disease causing impingement at L4-5 and L5-S1. Central narrowing of the thecal sac at L4-5 due to spondylosis and degenerative disc disease. 4. Aortic atherosclerosis. Aortic Atherosclerosis (ICD10-I70.0). Electronically Signed   By: Van Clines M.D.   On: 03/06/2020 20:48   DG Chest Port 1 View  Result Date: 03/06/2020 CLINICAL DATA:  Shortness of breath EXAM: PORTABLE CHEST 1 VIEW COMPARISON:  02/01/2020 FINDINGS: Interval placement of right internal jugular approach Port-A-Cath with distal tip terminating at the level of the distal SVC. Prior aortic valve replacement and ascending thoracic aorta stent graft. Median sternotomy. Stable cardiomegaly. Atherosclerotic calcification of the aortic knob. No focal airspace consolidation, pleural effusion, or pneumothorax. IMPRESSION: Interval placement of right chest port. Otherwise stable exam from prior. No acute cardiopulmonary abnormality. No pneumothorax. Electronically Signed   By: Davina Poke D.O.   On: 03/06/2020 16:54        ASSESSMENT and PLAN:  1.  Metastatic urothelial carcinoma: -Received cycle 1 of gemcitabine and carboplatin on 03/04/2020. -He is due for day 8 of gemcitabine on 03/11/2020. -If he has active infection or  still in the hospital, we will hold his day 8 of chemotherapy.  2.  Sepsis secondary to UTI: -He has bilateral nephrostomy tubes placed on 02/22/2020.  Cultures so far are negative. -CTAP on 03/06/2020 did not show any evidence source of infection. -Continue Rocephin.  Follow-up on blood and urine cultures.  3.  Decompensated diastolic and systolic heart failure: -Last 2D echo 12/2019 with EF 45-50%.  BNP elevated at 1930. -Diuresis as tolerated.  4.  Microcytic anemia: -Received Feraheme on 03/04/2020. -Hemoglobin trending down, likely from dilution and chemotherapy effect.  All questions were answered. The patient knows to call the clinic with any problems, questions or concerns. We can certainly see the patient much sooner if necessary.   Derek Jack

## 2020-03-07 NOTE — Progress Notes (Signed)
PROGRESS NOTE    [name]  GNF:[id number] DOB: [birth year] DOA: 03/06/2020 PCP: [name], MD   Brief Narrative:  Per HPI: [name] is a 69 y.o. male with medical history significant for metastatic urothelial cancer, aortic valve replacement, coronary artery disease, diastolic and systolic CHF. Patient presented to the ED with complaints of fever and chills that started this afternoon at about 1 PM.  Reports feeling unwell.  He reports difficulty breathing that has resolved. His bilateral nephrostomy tubes were inserted 6/3, he has had pain from both sides, this pain is unchanged.  No surrounding erythema or discharge. Patient has had increasing bilateral lower extremity swelling since he was diagnosed with bladder cancer.  Reports the swelling is now involving his scrotum. No abdominal pain, no vomiting or loose stools. He denies any surrounding erythema around nephrostomy tubes, no discharge from skin.  Prior to arrival patient was placed on oxygen and given bronchodilator treatments by EMS, on arrival to the ED, his difficulty breathing had resolved.  ED Course: Temperature 101.5, blood pressure systolic 84-696, O2 sats greater than 93% on room air.  Heart rate 70s to 80s.  WBC 16.7.  Hemoglobin down to 8.7.  Sodium 128.  Normal lactic acid 1.5.  BNP elevated at 1930.  Urine samples withdrawn from both nephrostomy tube,-UA showed greater than 50 WBCs, large leukocytes few bacteria. Patient was started on IV ceftriaxone.  Hospitalist to admit for urinary tract infection.  Assessment & Plan:   Principal Problem:   UTI (urinary tract infection) Active Problems:   Essential hypertension, benign   S/P aortic valve replacement   Coronary atherosclerosis of native coronary artery   Depression, major, single episode, mild (HCC)   Acute on chronic HFrEF (heart failure with reduced ejection fraction) -combined systolic and diastolic dysfunction CHF   Metastatic  urothelial carcinoma (HCC)   Port-A-Cath in place   Sepsis secondary to UTI -This is in the setting of bilateral nephrostomy tubes with no signs of pyelonephritis or hydroureteral nephrosis noted on CT scan -Continue IV Rocephin at 2 g dosing and follow blood cultures which have been negative at 24 hours as well as urine cultures -May need to discuss Port-A-Cath if blood cultures positive -Lactic acid downtrending, continue to monitor in a.m. along with CBC  Metastatic urothelial cancer with bilateral nephrostomy tubes -Initial chemotherapy performed 6/14 -Urology planning to follow-up in outpatient in 4 weeks and this is appropriate -Appreciate oncology evaluation and consultation this patient feels that he may have a lot of symptoms related to his initial chemotherapy start -May require palliative consultation in outpatient setting to review goals of care  Decompensated diastolic and systolic heart failure -Bilateral lower extremity edema noted -Last 2D echocardiogram 12/2019 with EF 45-50% -Blood pressures are improved and sepsis physiology appears to be resolving -Plan to start diuresis with Lasix today -Follow daily weights and I's and O's  Chronic anemia -Baseline hemoglobin 8-10 -Started on weekly Feraheme by Dr. Delton Coombes -Recent upper endoscopy done on 01/2020 with grade a reflux esophagitis -Follow CBC and transfuse as needed  Hypertension -Okay to use Lasix for diuresis and hold lisinopril for now -Hold metoprolol for now  History of CAD -PCI in 2018 -No signs of ACS noted -Aspirin and Plavix may be resumed  Prior aortic valve replacement  DVT prophylaxis: Heparin Code Status: Full code Family Communication: None at bedside Disposition Plan: Admit for treatment of UTI and diuresis. Status is: Inpatient  Remains inpatient appropriate because:IV treatments appropriate due  to intensity of illness or inability to take PO and Inpatient level of care appropriate due  to severity of illness   Dispo: The patient is from: Home              Anticipated d/c is to: Home              Anticipated d/c date is: 2 days              Patient currently is not medically stable to d/c.  Consultants:   Oncology-Dr. Delton Coombes  Procedures:   See below  Antimicrobials:  Anti-infectives (From admission, onward)   Start     Dose/Rate Route Frequency Ordered Stop   03/07/20 1600  cefTRIAXone (ROCEPHIN) 1 g in sodium chloride 0.9 % 100 mL IVPB  Status:  Discontinued        1 g 200 mL/hr over 30 Minutes Intravenous Every 24 hours 03/06/20 2103 03/07/20 0713   03/07/20 1600  cefTRIAXone (ROCEPHIN) 2 g in sodium chloride 0.9 % 100 mL IVPB     Discontinue     2 g 200 mL/hr over 30 Minutes Intravenous Every 24 hours 03/07/20 0713     03/06/20 1845  cefTRIAXone (ROCEPHIN) 1 g in sodium chloride 0.9 % 100 mL IVPB        1 g 200 mL/hr over 30 Minutes Intravenous  Once 03/06/20 1834 03/06/20 1922      Subjective: Patient seen and evaluated today with no new acute complaints or concerns. No acute concerns or events noted overnight.  He denies any chest pain or shortness of breath.  Objective: Vitals:   03/06/20 1900 03/06/20 2043 03/07/20 0056 03/07/20 0609  BP: 91/65 133/61 103/64 116/86  Pulse: 74 79 76 88  Resp: 20 20    Temp:  98 F (36.7 C) 98.5 F (36.9 C) 98.6 F (37 C)  TempSrc:  Oral Oral Oral  SpO2: 94% 100% 100% 100%  Weight:  61.6 kg    Height:  5\' 4"  (1.626 m)      Intake/Output Summary (Last 24 hours) at 03/07/2020 1105 Last data filed at 03/07/2020 0900 Gross per 24 hour  Intake 999 ml  Output 1375 ml  Net -376 ml   Filed Weights   03/06/20 1548 03/06/20 2043  Weight: 54.4 kg 61.6 kg    Examination:  General exam: Appears calm and comfortable  Respiratory system: Clear to auscultation. Respiratory effort normal. Cardiovascular system: S1 & S2 heard, RRR. No JVD, murmurs, rubs, gallops or clicks. No pedal edema. Gastrointestinal  system: Abdomen is nondistended, soft and nontender. No organomegaly or masses felt. Normal bowel sounds heard.  Bilateral nephrostomy tubes present with bags containing clear, yellow urine output. Central nervous system: Alert and oriented. No focal neurological deficits. Extremities: Bilateral edema noted. Skin: No rashes, lesions or ulcers Psychiatry: Judgement and insight appear normal. Mood & affect appropriate.     Data Reviewed: I have personally reviewed following labs and imaging studies  CBC: Recent Labs  Lab 03/04/20 0948 03/06/20 1633 03/07/20 0518  WBC 10.4 16.7* 12.6*  NEUTROABS 8.0* 16.1*  --   HGB 9.2* 8.7* 7.8*  HCT 29.5* 27.8* 25.1*  MCV 73.2* 72.2* 72.5*  PLT 430* 372 244   Basic Metabolic Panel: Recent Labs  Lab 03/04/20 0948 03/06/20 1633 03/07/20 0518  NA 128* 128* 128*  K 3.9 4.7 4.6  CL 94* 95* 97*  CO2 24 23 23   GLUCOSE 92 89 99  BUN 10 23 24*  CREATININE 0.82 0.97 1.00  CALCIUM 8.1* 7.7* 7.4*  MG 1.8 1.9  --    GFR: Estimated Creatinine Clearance: 59.2 mL/min (by C-G formula based on SCr of 1 mg/dL). Liver Function Tests: Recent Labs  Lab 03/04/20 0948  AST 16  ALT 13  ALKPHOS 169*  BILITOT 0.7  PROT 6.8  ALBUMIN 3.0*   No results for input(s): LIPASE, AMYLASE in the last 168 hours. No results for input(s): AMMONIA in the last 168 hours. Coagulation Profile: No results for input(s): INR, PROTIME in the last 168 hours. Cardiac Enzymes: No results for input(s): CKTOTAL, CKMB, CKMBINDEX, TROPONINI in the last 168 hours. BNP (last 3 results) No results for input(s): PROBNP in the last 8760 hours. HbA1C: No results for input(s): HGBA1C in the last 72 hours. CBG: No results for input(s): GLUCAP in the last 168 hours. Lipid Profile: No results for input(s): CHOL, HDL, LDLCALC, TRIG, CHOLHDL, LDLDIRECT in the last 72 hours. Thyroid Function Tests: No results for input(s): TSH, T4TOTAL, FREET4, T3FREE, THYROIDAB in the last 72  hours. Anemia Panel: No results for input(s): VITAMINB12, FOLATE, FERRITIN, TIBC, IRON, RETICCTPCT in the last 72 hours. Sepsis Labs: Recent Labs  Lab 03/06/20 1633 03/07/20 0749  LATICACIDVEN 1.5 0.9    Recent Results (from the past 240 hour(s))  SARS Coronavirus 2 by RT PCR (hospital order, performed in [facility] hospital lab) Nasopharyngeal Nasopharyngeal Swab     Status: None   Collection Time: 03/06/20  4:25 PM   Specimen: Nasopharyngeal Swab  Result Value Ref Range Status   SARS Coronavirus 2 NEGATIVE NEGATIVE Final    Comment: (NOTE) SARS-CoV-2 target nucleic acids are NOT DETECTED.  The SARS-CoV-2 RNA is generally detectable in upper and lower respiratory specimens during the acute phase of infection. The lowest concentration of SARS-CoV-2 viral copies this assay can detect is 250 copies / mL. A negative result does not preclude SARS-CoV-2 infection and should not be used as the sole basis for treatment or other patient management decisions.  A negative result may occur with improper specimen collection / handling, submission of specimen other than nasopharyngeal swab, presence of viral mutation(s) within the areas targeted by this assay, and inadequate number of viral copies (<250 copies / mL). A negative result must be combined with clinical observations, patient history, and epidemiological information.  Fact Sheet for Patients:   StrictlyIdeas.no  Fact Sheet for Healthcare Providers: BankingDealers.co.za  This test is not yet approved or  cleared by the [place] FDA and has been authorized for detection and/or diagnosis of SARS-CoV-2 by FDA under an Emergency Use Authorization (EUA).  This EUA will remain in effect (meaning this test can be used) for the duration of the COVID-19 declaration under Section 564(b)(1) of the Act, 21 U.S.C. section 360bbb-3(b)(1), unless the authorization is terminated or revoked  sooner.  Performed at [facility], [address], [place], [place] [address]   Culture, blood (routine x 2)     Status: None (Preliminary result)   Collection Time: 03/06/20  4:34 PM   Specimen: Left Antecubital; Blood  Result Value Ref Range Status   Specimen Description LEFT ANTECUBITAL  Final   Special Requests   Final    BOTTLES DRAWN AEROBIC AND ANAEROBIC Blood Culture adequate volume   Culture   Final    NO GROWTH < 24 HOURS Performed at [facility], [address], [place], [place] [address]    Report Status PENDING  Incomplete  Culture, blood (routine x 2)  Status: None (Preliminary result)   Collection Time: 03/06/20  4:39 PM   Specimen: Left Antecubital; Blood  Result Value Ref Range Status   Specimen Description LEFT ANTECUBITAL  Final   Special Requests   Final    BOTTLES DRAWN AEROBIC AND ANAEROBIC Blood Culture adequate volume   Culture   Final    NO GROWTH < 24 HOURS Performed at [facility], [address], [place], [place] [address]    Report Status PENDING  Incomplete         Radiology Studies: CT ABDOMEN PELVIS W CONTRAST  Result Date: 03/06/2020 CLINICAL DATA:  Fever and leukocytosis. Metastatic urothelial cancer. Recent initiation of carboplatin and gemcitabine therapy on 03/04/2020. Back pain and fever. EXAM: CT ABDOMEN AND PELVIS WITH CONTRAST TECHNIQUE: Multidetector CT imaging of the abdomen and pelvis was performed using the standard protocol following bolus administration of intravenous contrast. CONTRAST:  146mL OMNIPAQUE IOHEXOL 300 MG/ML  SOLN COMPARISON:  02/02/2020 FINDINGS: Lower chest: Moderate cardiomegaly with right coronary artery atherosclerosis. Prosthetic aortic valve. Mild scarring or atelectasis in the posterior basal segment right lower lobe. Hepatobiliary: Embolization coils along the porta hepatis. No discrete hepatic mass is identified. Gallbladder unremarkable. Pancreas: Unremarkable Spleen: Lobular spleen, unchanged from prior.  Adrenals/Urinary Tract: Adrenal glands unremarkable. Complex lesion along the right kidney upper pole measuring 2.3 by 1.5 cm, internal density approximately 70 Hounsfield units, probably a complex cyst and not appreciably changed from 02/02/2020. Prior PET-CT negative in this vicinity suggesting likely benign nature. Scarring in the right mid kidney. Scarring in the left kidney upper pole. Bilateral percutaneous nephrostomy tubes with coils formed in the renal pelvis bilaterally. No significant hydronephrosis. There is a large amount of tumor eccentric to the right in the urinary bladder. I cannot exclude invasion of the right seminal vesicle by tumor based on the current appearance. Dominant tumor in the right bladder measures about 8.4 by 4.5 cm on image 56/2. There is likely some tumor along the bladder base as well. Perivesical nodularity including a 1.0 cm soft tissue nodule adjacent to the right bladder tumor on image 54/5. There loops of bowel adjacent to the bladder tumor, without definite invasion of bowel by the tumor. Stomach/Bowel: Prominent stool throughout the colon favors constipation. No dilated small bowel. Vascular/Lymphatic: Aortoiliac atherosclerotic vascular disease. Left periaortic node 1.1 cm in short axis on image 21/2, previously 1.4 cm. Right external iliac node partially encasing the right external iliac artery, 3.7 cm in short axis on image 50/2, previously 3.0 cm. Left pelvic sidewall lymph node 1.1 cm in short axis on image 56/2, previously the same. Other bilateral external iliac lymph nodes are mildly enlarged and there are smaller inguinal lymph nodes noted. Reproductive: Prostatomegaly. As noted above, I cannot exclude bladder tumor invading back into the right seminal vesicle given the appearance for example on image 52/6. Other: Subcutaneous edema along the anterior abdominal wall extending down into the scrotum and penis which appear expanded by infiltrative edema. This edema  also extends into the upper thigh region, right greater than left. Musculoskeletal: Scattered osseous metastatic disease including visualized ribs, lumbar spine, bony pelvis, and inter trochanteric region of the right proximal femur. The sclerotic metastatic lesions are increased in size and number. An index lesion in the left inferior pubic ramus measures 2.0 by 1.1 cm, formerly 0.7 by 0.7 cm. Grade 1 degenerative anterolisthesis at L4-5 with bilateral resulting foraminal stenosis due to disc uncovering and facet arthropathy. Facet arthropathy also contributes to bilateral foraminal impingement  at L5-S1. There is endplate sclerosis at H9-6 along with loss of disc height which is stable from the prior exam. There is central narrowing of the thecal sac at the L4-5 level not appreciably changed from 02/02/2020. I do not see compelling evidence of enhancing tumor in the epidural space although MRI is of course more sensitive. I not see a definite pathologic fracture. IMPRESSION: 1. Large amount of tumor eccentric to the right in the urinary bladder with some adjacent nodularity. I cannot exclude invasion of the right seminal vesicle by tumor. Mild retroperitoneal and bilateral external iliac adenopathy. Moderately worsened osseous metastatic disease. 2. Subcutaneous edema along the anterior abdominal wall extending down into the scrotum and penis which appear expanded by infiltrative edema. This also extends into the upper thigh region, right greater than left. It is quite possible that the large right external iliac node measuring 3.7 cm in short axis is causing extrinsic compression on the right external iliac vein which may be contributing to the asymmetry of subcutaneous edema in the right upper thigh. 3. Other imaging findings of potential clinical significance: Moderate cardiomegaly with right coronary artery atherosclerosis. Bilateral percutaneous nephrostomy tubes without significant hydronephrosis. Prominent  stool throughout the colon favors constipation. Complex lesion along the right kidney upper pole, probably a complex cyst given lack of PET activity, and not appreciably changed from 02/02/2020. Lumbar spondylosis and degenerative disc disease causing impingement at L4-5 and L5-S1. Central narrowing of the thecal sac at L4-5 due to spondylosis and degenerative disc disease. 4. Aortic atherosclerosis. Aortic Atherosclerosis (ICD10-I70.0). Electronically Signed   By: Van Clines M.D.   On: 03/06/2020 20:48   DG Chest Port 1 View  Result Date: 03/06/2020 CLINICAL DATA:  Shortness of breath EXAM: PORTABLE CHEST 1 VIEW COMPARISON:  02/01/2020 FINDINGS: Interval placement of right internal jugular approach Port-A-Cath with distal tip terminating at the level of the distal SVC. Prior aortic valve replacement and ascending thoracic aorta stent graft. Median sternotomy. Stable cardiomegaly. Atherosclerotic calcification of the aortic knob. No focal airspace consolidation, pleural effusion, or pneumothorax. IMPRESSION: Interval placement of right chest port. Otherwise stable exam from prior. No acute cardiopulmonary abnormality. No pneumothorax. Electronically Signed   By: Davina Poke D.O.   On: 03/06/2020 16:54        Scheduled Meds: . busPIRone  10 mg Oral BID  . Chlorhexidine Gluconate Cloth  6 each Topical Daily  . escitalopram  10 mg Oral Daily  . heparin  5,000 Units Subcutaneous Q8H   Continuous Infusions: . cefTRIAXone (ROCEPHIN)  IV       LOS: 1 day    Time spent: 40 minutes    Blue Ruggerio Darleen Crocker, DO [facility]  If 7PM-7AM, please contact night-coverage [email or web address] 03/07/2020, 11:05 AM

## 2020-03-07 NOTE — TOC Initial Note (Signed)
Transition of Care [facility]) - Initial/Assessment Note   Patient Details  Name: [name] MRN: [id number] Date of Birth: 1950/10/19  [facility] CM/SW Contact:    Sherie Don, LCSW Phone Number: 03/07/2020, 8:14 PM  Clinical Narrative: Patient is a 69 year old male who was admitted for UTI. Readmission prevention screening completed due to high admission score. CSW spoke with patient to complete initial assessment. Per patient, he lives alone and does not have any DME in his home. Patient reported he does not have a history of [place] services. TOC to follow for potential needs.   Expected Discharge Plan: Home/Self Care Barriers to Discharge: Continued Medical Work up   Patient Goals and CMS Choice Patient states their goals for this hospitalization and ongoing recovery are:: Return home CMS Medicare.gov Compare Post Acute Care list provided to:: Patient Choice offered to / list presented to : Patient  Expected Discharge Plan and Services Expected Discharge Plan: Home/Self Care Living arrangements for the past 2 months: Single Family Home  Prior Living Arrangements/Services Living arrangements for the past 2 months: Single Family Home Lives with:: Self Patient language and need for interpreter reviewed:: Yes Do you feel safe going back to the place where you live?: Yes      Need for Family Participation in Patient Care: No (Comment) Care giver support system in place?: Yes (comment) Alejandro Mulling (sister) Ph: [phone]) Criminal Activity/Legal Involvement Pertinent to Current Situation/Hospitalization: No - Comment as needed  Activities of Daily Living Home Assistive Devices/Equipment: Cane (specify quad or straight), Eyeglasses ADL Screening (condition at time of admission) Patient's cognitive ability adequate to safely complete daily activities?: Yes Is the patient deaf or have difficulty hearing?: No Does the patient have difficulty seeing, even when wearing  glasses/contacts?: Yes (blind L eye since birth) Does the patient have difficulty concentrating, remembering, or making decisions?: No Patient able to express need for assistance with ADLs?: Yes Does the patient have difficulty dressing or bathing?: No Independently performs ADLs?: Yes (appropriate for developmental age) Does the patient have difficulty walking or climbing stairs?: No Weakness of Legs: Left Weakness of Arms/Hands: None  Emotional Assessment Appearance:: Appears stated age Attitude/Demeanor/Rapport: Engaged Affect (typically observed): Appropriate Orientation: : Oriented to Self, Oriented to Place, Oriented to  Time, Oriented to Situation Alcohol / Substance Use: Not Applicable Psych Involvement: No (comment)  Admission diagnosis:  UTI (urinary tract infection) [N39.0] Pyelonephritis [N12] Chronic anemia [D64.9] Metastatic urothelial carcinoma (HCC) [C79.10] Fever in adult [R50.9] Patient Active Problem List   Diagnosis Date Noted  . Fever in adult   . UTI (urinary tract infection) 03/06/2020  . Port-A-Cath in place 02/23/2020  . Hydrocele 02/21/2020  . Metastatic urothelial carcinoma ([place]) 02/13/2020  . Goals of care, counseling/discussion 02/13/2020  . GI bleed 02/01/2020  . Low oxygen saturation 01/31/2020  . Acute GI bleeding 01/31/2020  . Chronic anemia 01/31/2020  . Acute on chronic HFrEF (heart failure with reduced ejection fraction) -combined systolic and diastolic dysfunction CHF 38/25/0539  . Nervously anxious 12/20/2019  . Depression, major, single episode, mild ([place]) 12/20/2019  . Hematuria 10/17/2019  . Pain with urination 10/17/2019  . Weight loss, non-intentional 10/17/2019  . Annual visit for general adult medical examination with abnormal findings 09/12/2019  . Supraclavicular lymphadenopathy 09/12/2019  . Grade 3 out of 6 intensity murmur 09/12/2019  . Bilateral recurrent inguinal hernias 03/08/2019  . Hernia of abdominal cavity 02/27/2019   . Claudication in peripheral vascular disease ([place]) 10/31/2018  .  Peripheral arterial disease ([place]) 10/26/2018  . Shortness of breath on exertion 10/16/2017  . Severe aortic insufficiency 10/02/2017  . Allergic rhinitis 12/30/2015  . ED (erectile dysfunction) 03/27/2015  . Hyperlipidemia LDL goal <70 08/25/2013  . Aortic atherosclerosis ([place]) 08/12/2013  . Coronary atherosclerosis of native coronary artery 08/10/2013  . Hepatic artery aneurysm ([place]) 12/10/2010  . S/P aortic valve replacement   . Essential hypertension, benign 05/13/2010   PCP:  [name], MD Pharmacy:   [facility] [address], [place] - [facility] [address] [place] [address] Phone: [phone] Fax: [phone]  Readmission Risk Interventions Readmission Risk Prevention Plan 03/07/2020  Transportation Screening Complete  PCP or Specialist Appt within 3-5 Days Not Complete  Not Complete comments Assessment completed during second shift  [place] or [place] Complete  Social Work Consult for [place] Planning/Counseling Complete  Palliative Care Screening Not Applicable  Medication Review Press photographer) Complete  Some recent data might be hidden

## 2020-03-07 NOTE — Plan of Care (Signed)

## 2020-03-08 LAB — BASIC METABOLIC PANEL
Anion gap: 9 (ref 5–15)
BUN: 24 mg/dL — ABNORMAL HIGH (ref 8–23)
CO2: 25 mmol/L (ref 22–32)
Calcium: 8 mg/dL — ABNORMAL LOW (ref 8.9–10.3)
Chloride: 92 mmol/L — ABNORMAL LOW (ref 98–111)
Creatinine, Ser: 0.96 mg/dL (ref 0.61–1.24)
GFR calc Af Amer: >60 mL/min (ref >60)
GFR calc non Af Amer: >60 mL/min (ref >60)
Glucose, Bld: 100 mg/dL — ABNORMAL HIGH (ref 70–99)
Potassium: 4.1 mmol/L (ref 3.5–5.1)
Sodium: 126 mmol/L — ABNORMAL LOW (ref 135–145)

## 2020-03-08 LAB — CBC
HCT: 26.3 % — ABNORMAL LOW (ref 39.0–52.0)
Hemoglobin: 8.1 g/dL — ABNORMAL LOW (ref 13.0–17.0)
MCH: 22.4 pg — ABNORMAL LOW (ref 26.0–34.0)
MCHC: 30.8 g/dL (ref 30.0–36.0)
MCV: 72.9 fL — ABNORMAL LOW (ref 80.0–100.0)
Platelets: 319 10*3/uL (ref 150–400)
RBC: 3.61 MIL/uL — ABNORMAL LOW (ref 4.22–5.81)
RDW: 22.5 % — ABNORMAL HIGH (ref 11.5–15.5)
WBC: 10.8 10*3/uL — ABNORMAL HIGH (ref 4.0–10.5)
nRBC: 0 % (ref 0.0–0.2)

## 2020-03-08 LAB — MAGNESIUM: Magnesium: 1.7 mg/dL (ref 1.7–2.4)

## 2020-03-08 MED ORDER — PIPERACILLIN-TAZOBACTAM 3.375 G IVPB
3.3750 g | Freq: Three times a day (TID) | INTRAVENOUS | Status: DC
  Administered 2020-03-08 – 2020-03-09 (×3): 3.375 g via INTRAVENOUS
  Filled 2020-03-08 (×3): qty 50

## 2020-03-08 MED ORDER — PIPERACILLIN-TAZOBACTAM 3.375 G IVPB
3.3750 g | Freq: Once | INTRAVENOUS | Status: AC
  Administered 2020-03-08: 3.375 g via INTRAVENOUS
  Filled 2020-03-08: qty 50

## 2020-03-08 NOTE — Plan of Care (Signed)
  Problem: Education: Goal: Knowledge of General Education information will improve Description: Including pain rating scale, medication(s)/side effects and non-pharmacologic comfort measures 03/08/2020 1640 by Melony Overly, RN Outcome: Progressing 03/08/2020 1401 by Melony Overly, RN Outcome: Progressing   Problem: Health Behavior/Discharge Planning: Goal: Ability to manage health-related needs will improve 03/08/2020 1640 by Melony Overly, RN Outcome: Progressing 03/08/2020 1401 by Melony Overly, RN Outcome: Progressing   Problem: Clinical Measurements: Goal: Ability to maintain clinical measurements within normal limits will improve 03/08/2020 1640 by Melony Overly, RN Outcome: Progressing 03/08/2020 1401 by Melony Overly, RN Outcome: Progressing Goal: Will remain free from infection 03/08/2020 1640 by Melony Overly, RN Outcome: Progressing 03/08/2020 1401 by Melony Overly, RN Outcome: Progressing Goal: Diagnostic test results will improve 03/08/2020 1640 by Melony Overly, RN Outcome: Progressing 03/08/2020 1401 by Melony Overly, RN Outcome: Progressing Goal: Respiratory complications will improve 03/08/2020 1640 by Melony Overly, RN Outcome: Progressing 03/08/2020 1401 by Melony Overly, RN Outcome: Progressing Goal: Cardiovascular complication will be avoided 03/08/2020 1640 by Melony Overly, RN Outcome: Progressing 03/08/2020 1401 by Melony Overly, RN Outcome: Progressing   Problem: Activity: Goal: Risk for activity intolerance will decrease 03/08/2020 1640 by Melony Overly, RN Outcome: Progressing 03/08/2020 1401 by Melony Overly, RN Outcome: Progressing   Problem: Nutrition: Goal: Adequate nutrition will be maintained 03/08/2020 1640 by Melony Overly, RN Outcome: Progressing 03/08/2020 1401 by Melony Overly, RN Outcome: Progressing   Problem: Coping: Goal: Level of anxiety will decrease 03/08/2020 1640 by Melony Overly, RN Outcome:  Progressing 03/08/2020 1401 by Melony Overly, RN Outcome: Progressing   Problem: Elimination: Goal: Will not experience complications related to bowel motility 03/08/2020 1640 by Melony Overly, RN Outcome: Progressing 03/08/2020 1401 by Melony Overly, RN Outcome: Progressing Goal: Will not experience complications related to urinary retention 03/08/2020 1640 by Melony Overly, RN Outcome: Progressing 03/08/2020 1401 by Melony Overly, RN Outcome: Progressing   Problem: Pain Managment: Goal: General experience of comfort will improve 03/08/2020 1640 by Melony Overly, RN Outcome: Progressing 03/08/2020 1401 by Melony Overly, RN Outcome: Progressing   Problem: Safety: Goal: Ability to remain free from injury will improve 03/08/2020 1640 by Melony Overly, RN Outcome: Progressing 03/08/2020 1401 by Melony Overly, RN Outcome: Progressing   Problem: Skin Integrity: Goal: Risk for impaired skin integrity will decrease 03/08/2020 1640 by Melony Overly, RN Outcome: Progressing 03/08/2020 1401 by Melony Overly, RN Outcome: Progressing

## 2020-03-08 NOTE — Care Management Important Message (Signed)
Important Message  Patient Details  Name: [name] MRN: [id number] Date of Birth: 03-02-1951   Medicare Important Message Given:  Yes     [name] 03/08/2020, 4:25 PM

## 2020-03-08 NOTE — Plan of Care (Signed)

## 2020-03-08 NOTE — Progress Notes (Signed)
PROGRESS NOTE    BURL TAUZIN  ZYS:[id number] DOB: 1951/05/03 DOA: 03/06/2020 PCP: [name], MD   Brief Narrative:  Per HPI: Kimi Kroft Martinis a 69 y.o.malewith medical history significant formetastatic urothelial cancer, aortic valve replacement, coronary artery disease, diastolic and systolic CHF. Patient presented to the ED with complaints of fever and chills that started this afternoon at about 1 PM. Reports feeling unwell. He reports difficulty breathing that has resolved. His bilateral nephrostomy tubes were inserted 6/3,he has had pain from both sides, this pain is unchanged. No surrounding erythema or discharge. Patient has had increasing bilateral lower extremity swelling since he was diagnosed with bladder cancer. Reports the swelling is now involving his scrotum. No abdominal pain, no vomiting or loose stools. He denies any surrounding erythema around nephrostomy tubes, no discharge from skin.  Prior to arrival patient was placed on oxygen and given bronchodilator treatments by EMS, on arrival to the ED, his difficulty breathing had resolved.  ED Course:Temperature 101.5, blood pressure systolic 93-235, O2 sats greater than 93% on room air. Heart rate 70s to 80s. WBC 16.7. Hemoglobin down to 8.7. Sodium 128. Normal lactic acid 1.5. BNP elevated at 1930. Urine samples withdrawn from both nephrostomy tube,-UA showed greater than 50 WBCs, large leukocytes few bacteria. Patient was started on IV ceftriaxone. Hospitalist to admit for urinary tract infection.  Assessment & Plan:   Principal Problem:   UTI (urinary tract infection) Active Problems:   Essential hypertension, benign   S/P aortic valve replacement   Coronary atherosclerosis of native coronary artery   Depression, major, single episode, mild (HCC)   Chronic anemia   Acute on chronic HFrEF (heart failure with reduced ejection fraction) -combined systolic and diastolic dysfunction  CHF   Metastatic urothelial carcinoma (HCC)   Port-A-Cath in place   Fever in adult   Sepsis secondary to UTI-multiple organisms -Urine culture positive for Klebsiella, Pseudomonas, and Enterococcus with susceptibilities pending -This is in the setting of bilateral nephrostomy tubes with no signs of pyelonephritis or hydroureteral nephrosis noted on CT scan -IV Rocephin changed to Zosyn today for better coverage with sensitivities pending -May need to discuss Port-A-Cath removal if blood cultures positive, currently negative -Lactic acid downtrending, continue to monitor in a.m. along with CBC  Metastatic urothelial cancer with bilateral nephrostomy tubes -Initial chemotherapy performed 6/14 -Urology planning to follow-up in outpatient in 4 weeks and this is appropriate -Appreciate oncology evaluation and consultation  -May require palliative consultation in outpatient setting to review goals of care  Decompensated diastolic and systolic heart failure-improving -Bilateral lower extremity edema noted -Last 2D echocardiogram 12/2019 with EF 45-50% -Blood pressures are improved and sepsis physiology appears to be resolving -Continue Lasix for diuresis with over 4 L of net negative fluid balance overnight -Follow daily weights and I's and O's  Chronic anemia -Baseline hemoglobin 8-10 -Started on weekly Feraheme by Dr. Delton Coombes -Recent upper endoscopy done on 01/2020 with grade a reflux esophagitis -Follow CBC and transfuse as needed  Hypertension-stable  -Okay to use Lasix for diuresis and hold lisinopril for now -Hold metoprolol for now  History of CAD -PCI in 2018 -No signs of ACS noted -Aspirin and Plavix may be resumed  Prior aortic valve replacement  DVT prophylaxis: Heparin Code Status: Full code Family Communication: None at bedside Disposition Plan: Admit for treatment of UTI and diuresis.  Status is: Inpatient  Remains inpatient appropriate because:IV  treatments appropriate due to intensity of illness or inability to take PO and Inpatient  level of care appropriate due to severity of illness   Dispo: The patient is from: Home  Anticipated d/c is to: Home  Anticipated d/c date is: 2-3 days  Patient currently is not medically stable to d/c.  Consultants:   Oncology-Dr. Delton Coombes  Procedures:   See below  Antimicrobials:  Anti-infectives (From admission, onward)   Start     Dose/Rate Route Frequency Ordered Stop   03/08/20 1900  piperacillin-tazobactam (ZOSYN) IVPB 3.375 g     Discontinue     3.375 g 12.5 mL/hr over 240 Minutes Intravenous Every 8 hours 03/08/20 1026     03/08/20 1100  piperacillin-tazobactam (ZOSYN) IVPB 3.375 g        3.375 g 100 mL/hr over 30 Minutes Intravenous  Once 03/08/20 1024 03/08/20 1203   03/07/20 1600  cefTRIAXone (ROCEPHIN) 1 g in sodium chloride 0.9 % 100 mL IVPB  Status:  Discontinued        1 g 200 mL/hr over 30 Minutes Intravenous Every 24 hours 03/06/20 2103 03/07/20 0713   03/07/20 1600  cefTRIAXone (ROCEPHIN) 2 g in sodium chloride 0.9 % 100 mL IVPB  Status:  Discontinued        2 g 200 mL/hr over 30 Minutes Intravenous Every 24 hours 03/07/20 0713 03/08/20 1023   03/06/20 1845  cefTRIAXone (ROCEPHIN) 1 g in sodium chloride 0.9 % 100 mL IVPB        1 g 200 mL/hr over 30 Minutes Intravenous  Once 03/06/20 1834 03/06/20 1922      Subjective: Patient seen and evaluated today with no new acute complaints or concerns. No acute concerns or events noted overnight.  His lower extremity edema is decreasing.  Objective: Vitals:   03/07/20 0609 03/07/20 1501 03/08/20 0535 03/08/20 0840  BP: 116/86 116/69 140/68 126/65  Pulse: 88 79 84 84  Resp:  18 16 17   Temp: 98.6 F (37 C) 97.6 F (36.4 C) 98.2 F (36.8 C) 98.4 F (36.9 C)  TempSrc: Oral Oral  Oral  SpO2: 100% 100% 100% 100%  Weight:      Height:        Intake/Output Summary (Last 24 hours)  at 03/08/2020 1329 Last data filed at 03/08/2020 1135 Gross per 24 hour  Intake --  Output 4650 ml  Net -4650 ml   Filed Weights   03/06/20 1548 03/06/20 2043  Weight: 54.4 kg 61.6 kg    Examination:  General exam: Appears calm and comfortable  Respiratory system: Clear to auscultation. Respiratory effort normal. Cardiovascular system: S1 & S2 heard, RRR. No JVD, murmurs, rubs, gallops or clicks.  Scant pedal edema present. Gastrointestinal system: Abdomen is nondistended, soft and nontender. No organomegaly or masses felt. Normal bowel sounds heard. Central nervous system: Alert and oriented. No focal neurological deficits. Extremities: Symmetric 5 x 5 power. Skin: No rashes, lesions or ulcers Psychiatry: Judgement and insight appear normal. Mood & affect appropriate.  Bilateral nephrostomy tubes with Clear, yellow urine output noted.    Data Reviewed: I have personally reviewed following labs and imaging studies  CBC: Recent Labs  Lab 03/04/20 0948 03/06/20 1633 03/07/20 0518 03/08/20 0624  WBC 10.4 16.7* 12.6* 10.8*  NEUTROABS 8.0* 16.1*  --   --   HGB 9.2* 8.7* 7.8* 8.1*  HCT 29.5* 27.8* 25.1* 26.3*  MCV 73.2* 72.2* 72.5* 72.9*  PLT 430* 372 315 970   Basic Metabolic Panel: Recent Labs  Lab 03/04/20 0948 03/06/20 1633 03/07/20 0518 03/08/20 0624  NA 128* 128*  128* 126*  K 3.9 4.7 4.6 4.1  CL 94* 95* 97* 92*  CO2 24 23 23 25   GLUCOSE 92 89 99 100*  BUN 10 23 24* 24*  CREATININE 0.82 0.97 1.00 0.96  CALCIUM 8.1* 7.7* 7.4* 8.0*  MG 1.8 1.9  --  1.7   GFR: Estimated Creatinine Clearance: 61.7 mL/min (by C-G formula based on SCr of 0.96 mg/dL). Liver Function Tests: Recent Labs  Lab 03/04/20 0948  AST 16  ALT 13  ALKPHOS 169*  BILITOT 0.7  PROT 6.8  ALBUMIN 3.0*   No results for input(s): LIPASE, AMYLASE in the last 168 hours. No results for input(s): AMMONIA in the last 168 hours. Coagulation Profile: No results for input(s): INR, PROTIME in  the last 168 hours. Cardiac Enzymes: No results for input(s): CKTOTAL, CKMB, CKMBINDEX, TROPONINI in the last 168 hours. BNP (last 3 results) No results for input(s): PROBNP in the last 8760 hours. HbA1C: No results for input(s): HGBA1C in the last 72 hours. CBG: No results for input(s): GLUCAP in the last 168 hours. Lipid Profile: No results for input(s): CHOL, HDL, LDLCALC, TRIG, CHOLHDL, LDLDIRECT in the last 72 hours. Thyroid Function Tests: No results for input(s): TSH, T4TOTAL, FREET4, T3FREE, THYROIDAB in the last 72 hours. Anemia Panel: No results for input(s): VITAMINB12, FOLATE, FERRITIN, TIBC, IRON, RETICCTPCT in the last 72 hours. Sepsis Labs: Recent Labs  Lab 03/06/20 1633 03/07/20 0749  LATICACIDVEN 1.5 0.9    Recent Results (from the past 240 hour(s))  Urine culture     Status: Abnormal (Preliminary result)   Collection Time: 03/06/20  4:20 PM   Specimen: Urine, Catheterized  Result Value Ref Range Status   Specimen Description   Final    URINE, CATHETERIZED Performed at [facility], [address] [place], [place] [address]    Special Requests   Final    RIGHT NEPHROSTOMY Performed at [facility], [address] [place], [place] [address]    Culture (A)  Final    >=100,000 COLONIES/mL KLEBSIELLA PNEUMONIAE >=100,000 COLONIES/mL PSEUDOMONAS PUTIDA >=100,000 COLONIES/mL ENTEROCOCCUS FAECALIS SUSCEPTIBILITIES TO FOLLOW Performed at [facility], [place] [address], [place], [place] [address]    Report Status PENDING  Incomplete  SARS Coronavirus 2 by RT PCR (hospital order, performed in [facility] hospital lab) Nasopharyngeal Nasopharyngeal Swab     Status: None   Collection Time: 03/06/20  4:25 PM   Specimen: Nasopharyngeal Swab  Result Value Ref Range Status   SARS Coronavirus 2 NEGATIVE NEGATIVE Final    Comment: (NOTE) SARS-CoV-2 target nucleic acids are NOT DETECTED.  The SARS-CoV-2 RNA is generally detectable in upper and lower respiratory  specimens during the acute phase of infection. The lowest concentration of SARS-CoV-2 viral copies this assay can detect is 250 copies / mL. A negative result does not preclude SARS-CoV-2 infection and should not be used as the sole basis for treatment or other patient management decisions.  A negative result may occur with improper specimen collection / handling, submission of specimen other than nasopharyngeal swab, presence of viral mutation(s) within the areas targeted by this assay, and inadequate number of viral copies (<250 copies / mL). A negative result must be combined with clinical observations, patient history, and epidemiological information.  Fact Sheet for Patients:   StrictlyIdeas.no  Fact Sheet for Healthcare Providers: BankingDealers.co.za  This test is not yet approved or  cleared by the [place] FDA and has been authorized for detection and/or diagnosis of SARS-CoV-2 by  FDA under an Emergency Use Authorization (EUA).  This EUA will remain in effect (meaning this test can be used) for the duration of the COVID-19 declaration under Section 564(b)(1) of the Act, 21 U.S.C. section 360bbb-3(b)(1), unless the authorization is terminated or revoked sooner.  Performed at [facility], [address], [place], [place] [address]   Urine culture     Status: Abnormal (Preliminary result)   Collection Time: 03/06/20  4:28 PM   Specimen: Urine, Catheterized  Result Value Ref Range Status   Specimen Description   Final    URINE, CATHETERIZED Performed at [facility], [address], [place], [place] [address]    Special Requests   Final    LEFT NEPHROSTOMY TUBE Performed at [facility], [address] [place], [place] [address]    Culture (A)  Final    >=100,000 COLONIES/mL KLEBSIELLA PNEUMONIAE >=100,000 COLONIES/mL PSEUDOMONAS PUTIDA >=100,000 COLONIES/mL ENTEROCOCCUS FAECALIS SUSCEPTIBILITIES TO FOLLOW Performed at  [place] [address] [place], [place] [address]    Report Status PENDING  Incomplete  Culture, blood (routine x 2)     Status: None (Preliminary result)   Collection Time: 03/06/20  4:34 PM   Specimen: Left Antecubital; Blood  Result Value Ref Range Status   Specimen Description LEFT ANTECUBITAL  Final   Special Requests   Final    BOTTLES DRAWN AEROBIC AND ANAEROBIC Blood Culture adequate volume   Culture   Final    NO GROWTH 2 DAYS Performed at [facility], [address] [place], [place] [address]    Report Status PENDING  Incomplete  Culture, blood (routine x 2)     Status: None (Preliminary result)   Collection Time: 03/06/20  4:39 PM   Specimen: Left Antecubital; Blood  Result Value Ref Range Status   Specimen Description LEFT ANTECUBITAL  Final   Special Requests   Final    BOTTLES DRAWN AEROBIC AND ANAEROBIC Blood Culture adequate volume   Culture   Final    NO GROWTH 2 DAYS Performed at [facility], [address] [place],  [address]    Report Status PENDING  Incomplete         Radiology Studies: CT ABDOMEN PELVIS W CONTRAST  Result Date: 03/06/2020 CLINICAL DATA:  Fever and leukocytosis. Metastatic urothelial cancer. Recent initiation of carboplatin and gemcitabine therapy on 03/04/2020. Back pain and fever. EXAM: CT ABDOMEN AND PELVIS WITH CONTRAST TECHNIQUE: Multidetector CT imaging of the abdomen and pelvis was performed using the standard protocol following bolus administration of intravenous contrast. CONTRAST:  171mL OMNIPAQUE IOHEXOL 300 MG/ML  SOLN COMPARISON:  02/02/2020 FINDINGS: Lower chest: Moderate cardiomegaly with right coronary artery atherosclerosis. Prosthetic aortic valve. Mild scarring or atelectasis in the posterior basal segment right lower lobe. Hepatobiliary: Embolization coils along the porta hepatis. No discrete hepatic mass is identified. Gallbladder unremarkable. Pancreas: Unremarkable Spleen: Lobular spleen, unchanged  from prior. Adrenals/Urinary Tract: Adrenal glands unremarkable. Complex lesion along the right kidney upper pole measuring 2.3 by 1.5 cm, internal density approximately 70 Hounsfield units, probably a complex cyst and not appreciably changed from 02/02/2020. Prior PET-CT negative in this vicinity suggesting likely benign nature. Scarring in the right mid kidney. Scarring in the left kidney upper pole. Bilateral percutaneous nephrostomy tubes with coils formed in the renal pelvis bilaterally. No significant hydronephrosis. There is a large amount of tumor eccentric to the right in the urinary bladder. I cannot exclude invasion of the right seminal vesicle by tumor based on the  current appearance. Dominant tumor in the right bladder measures about 8.4 by 4.5 cm on image 56/2. There is likely some tumor along the bladder base as well. Perivesical nodularity including a 1.0 cm soft tissue nodule adjacent to the right bladder tumor on image 54/5. There loops of bowel adjacent to the bladder tumor, without definite invasion of bowel by the tumor. Stomach/Bowel: Prominent stool throughout the colon favors constipation. No dilated small bowel. Vascular/Lymphatic: Aortoiliac atherosclerotic vascular disease. Left periaortic node 1.1 cm in short axis on image 21/2, previously 1.4 cm. Right external iliac node partially encasing the right external iliac artery, 3.7 cm in short axis on image 50/2, previously 3.0 cm. Left pelvic sidewall lymph node 1.1 cm in short axis on image 56/2, previously the same. Other bilateral external iliac lymph nodes are mildly enlarged and there are smaller inguinal lymph nodes noted. Reproductive: Prostatomegaly. As noted above, I cannot exclude bladder tumor invading back into the right seminal vesicle given the appearance for example on image 52/6. Other: Subcutaneous edema along the anterior abdominal wall extending down into the scrotum and penis which appear expanded by infiltrative edema.  This edema also extends into the upper thigh region, right greater than left. Musculoskeletal: Scattered osseous metastatic disease including visualized ribs, lumbar spine, bony pelvis, and inter trochanteric region of the right proximal femur. The sclerotic metastatic lesions are increased in size and number. An index lesion in the left inferior pubic ramus measures 2.0 by 1.1 cm, formerly 0.7 by 0.7 cm. Grade 1 degenerative anterolisthesis at L4-5 with bilateral resulting foraminal stenosis due to disc uncovering and facet arthropathy. Facet arthropathy also contributes to bilateral foraminal impingement at L5-S1. There is endplate sclerosis at K9-3 along with loss of disc height which is stable from the prior exam. There is central narrowing of the thecal sac at the L4-5 level not appreciably changed from 02/02/2020. I do not see compelling evidence of enhancing tumor in the epidural space although MRI is of course more sensitive. I not see a definite pathologic fracture. IMPRESSION: 1. Large amount of tumor eccentric to the right in the urinary bladder with some adjacent nodularity. I cannot exclude invasion of the right seminal vesicle by tumor. Mild retroperitoneal and bilateral external iliac adenopathy. Moderately worsened osseous metastatic disease. 2. Subcutaneous edema along the anterior abdominal wall extending down into the scrotum and penis which appear expanded by infiltrative edema. This also extends into the upper thigh region, right greater than left. It is quite possible that the large right external iliac node measuring 3.7 cm in short axis is causing extrinsic compression on the right external iliac vein which may be contributing to the asymmetry of subcutaneous edema in the right upper thigh. 3. Other imaging findings of potential clinical significance: Moderate cardiomegaly with right coronary artery atherosclerosis. Bilateral percutaneous nephrostomy tubes without significant hydronephrosis.  Prominent stool throughout the colon favors constipation. Complex lesion along the right kidney upper pole, probably a complex cyst given lack of PET activity, and not appreciably changed from 02/02/2020. Lumbar spondylosis and degenerative disc disease causing impingement at L4-5 and L5-S1. Central narrowing of the thecal sac at L4-5 due to spondylosis and degenerative disc disease. 4. Aortic atherosclerosis. Aortic Atherosclerosis (ICD10-I70.0). Electronically Signed   By: Van Clines M.D.   On: 03/06/2020 20:48   DG Chest Port 1 View  Result Date: 03/06/2020 CLINICAL DATA:  Shortness of breath EXAM: PORTABLE CHEST 1 VIEW COMPARISON:  02/01/2020 FINDINGS: Interval placement of right internal jugular approach Port-A-Cath  with distal tip terminating at the level of the distal SVC. Prior aortic valve replacement and ascending thoracic aorta stent graft. Median sternotomy. Stable cardiomegaly. Atherosclerotic calcification of the aortic knob. No focal airspace consolidation, pleural effusion, or pneumothorax. IMPRESSION: Interval placement of right chest port. Otherwise stable exam from prior. No acute cardiopulmonary abnormality. No pneumothorax. Electronically Signed   By: Davina Poke D.O.   On: 03/06/2020 16:54        Scheduled Meds: . aspirin EC  81 mg Oral Daily  . atorvastatin  40 mg Oral QPM  . busPIRone  10 mg Oral BID  . Chlorhexidine Gluconate Cloth  6 each Topical Daily  . escitalopram  10 mg Oral Daily  . furosemide  40 mg Intravenous Q12H  . heparin  5,000 Units Subcutaneous Q8H  . pantoprazole  40 mg Oral QAC breakfast  . polyethylene glycol  17 g Oral Daily   Continuous Infusions: . piperacillin-tazobactam (ZOSYN)  IV       LOS: 2 days    Time spent: 35 minutes    Makaelyn Aponte Darleen Crocker, DO [facility]  If 7PM-7AM, please contact night-coverage [email or web address] 03/08/2020, 1:29 PM

## 2020-03-08 NOTE — Progress Notes (Signed)
Pharmacy Antibiotic Note  [name] is a 69 y.o. male admitted on 03/06/2020 with UTI.  Pharmacy has been consulted for Zosyn dosing.  Plan: Zosyn 3.375g IV q8h (4 hour infusion).  Monitor labs, c/s, and patient improvement.  Height: 5\' 4"  (162.6 cm) Weight: 61.6 kg (135 lb 12.9 oz) IBW/kg (Calculated) : 59.2  Temp (24hrs), Avg:98.1 F (36.7 C), Min:97.6 F (36.4 C), Max:98.4 F (36.9 C)  Recent Labs  Lab 03/04/20 0948 03/06/20 1633 03/07/20 0518 03/07/20 0749 03/08/20 0624  WBC 10.4 16.7* 12.6*  --  10.8*  CREATININE 0.82 0.97 1.00  --  0.96  LATICACIDVEN  --  1.5  --  0.9  --     Estimated Creatinine Clearance: 61.7 mL/min (by C-G formula based on SCr of 0.96 mg/dL).    No Known Allergies  Antimicrobials this admission: Zosyn 6/18 >>  CTX 6/16 >>    Microbiology results: 6/16 BCx: ngtd 6/16 UCx: Klebsiella, pseudomonas, enterococcus faecalis  Thank you for allowing pharmacy to be a part of this patient's care.  Ramond Craver 03/08/2020 10:27 AM

## 2020-03-09 LAB — URINE CULTURE
Culture: >=100000 — AB
Culture: >=100000 — AB

## 2020-03-09 LAB — BASIC METABOLIC PANEL
Anion gap: 12 (ref 5–15)
BUN: 21 mg/dL (ref 8–23)
CO2: 25 mmol/L (ref 22–32)
Calcium: 8.2 mg/dL — ABNORMAL LOW (ref 8.9–10.3)
Chloride: 87 mmol/L — ABNORMAL LOW (ref 98–111)
Creatinine, Ser: 1.01 mg/dL (ref 0.61–1.24)
GFR calc Af Amer: >60 mL/min (ref >60)
GFR calc non Af Amer: >60 mL/min (ref >60)
Glucose, Bld: 92 mg/dL (ref 70–99)
Potassium: 4.3 mmol/L (ref 3.5–5.1)
Sodium: 124 mmol/L — ABNORMAL LOW (ref 135–145)

## 2020-03-09 LAB — CBC
HCT: 29.8 % — ABNORMAL LOW (ref 39.0–52.0)
Hemoglobin: 9.5 g/dL — ABNORMAL LOW (ref 13.0–17.0)
MCH: 22.8 pg — ABNORMAL LOW (ref 26.0–34.0)
MCHC: 31.9 g/dL (ref 30.0–36.0)
MCV: 71.5 fL — ABNORMAL LOW (ref 80.0–100.0)
Platelets: 341 10*3/uL (ref 150–400)
RBC: 4.17 MIL/uL — ABNORMAL LOW (ref 4.22–5.81)
RDW: 22.1 % — ABNORMAL HIGH (ref 11.5–15.5)
WBC: 8.7 10*3/uL (ref 4.0–10.5)
nRBC: 0 % (ref 0.0–0.2)

## 2020-03-09 LAB — MAGNESIUM: Magnesium: 1.8 mg/dL (ref 1.7–2.4)

## 2020-03-09 MED ORDER — AMOXICILLIN-POT CLAVULANATE 875-125 MG PO TABS
1.0000 | ORAL_TABLET | Freq: Two times a day (BID) | ORAL | Status: DC
  Administered 2020-03-09 – 2020-03-10 (×3): 1 via ORAL
  Filled 2020-03-09 (×3): qty 1

## 2020-03-09 MED ORDER — CIPROFLOXACIN HCL 250 MG PO TABS
500.0000 mg | ORAL_TABLET | Freq: Two times a day (BID) | ORAL | Status: DC
  Administered 2020-03-09 – 2020-03-10 (×3): 500 mg via ORAL
  Filled 2020-03-09 (×3): qty 2

## 2020-03-09 NOTE — Progress Notes (Signed)
PROGRESS NOTE    KOLYN ROZARIO  PYP:[id number] DOB: 1951/06/05 DOA: 03/06/2020 PCP: [name], MD   Brief Narrative:  Per HPI: Suleiman Finigan Martinis a 69 y.o.malewith medical history significant formetastatic urothelial cancer, aortic valve replacement, coronary artery disease, diastolic and systolic CHF. Patient presented to the ED with complaints of fever and chills that started this afternoon at about 1 PM. Reports feeling unwell. He reports difficulty breathing that has resolved. His bilateral nephrostomy tubes were inserted 6/3,he has had pain from both sides, this pain is unchanged. No surrounding erythema or discharge. Patient has had increasing bilateral lower extremity swelling since he was diagnosed with bladder cancer. Reports the swelling is now involving his scrotum. No abdominal pain, no vomiting or loose stools. He denies any surrounding erythema around nephrostomy tubes, no discharge from skin.  Prior to arrival patient was placed on oxygen and given bronchodilator treatments by EMS, on arrival to the ED, his difficulty breathing had resolved.  ED Course:Temperature 101.5, blood pressure systolic 24-580, O2 sats greater than 93% on room air. Heart rate 70s to 80s. WBC 16.7. Hemoglobin down to 8.7. Sodium 128. Normal lactic acid 1.5. BNP elevated at 1930. Urine samples withdrawn from both nephrostomy tube,-UA showed greater than 50 WBCs, large leukocytes few bacteria. Patient was started on IV ceftriaxone. Hospitalist to admit for urinary tract infection.   Assessment & Plan:   Principal Problem:   UTI (urinary tract infection) Active Problems:   Essential hypertension, benign   S/P aortic valve replacement   Coronary atherosclerosis of native coronary artery   Depression, major, single episode, mild (HCC)   Chronic anemia   Acute on chronic HFrEF (heart failure with reduced ejection fraction) -combined systolic and diastolic dysfunction  CHF   Metastatic urothelial carcinoma (HCC)   Port-A-Cath in place   Fever in adult   Sepsis secondary to UTI-multiple organisms -Urine culture positive for Klebsiella, Pseudomonas, and Enterococcus with susceptibilities pending -This is in the setting of bilateral nephrostomy tubes with no signs of pyelonephritis or hydroureteral nephrosis noted on CT scan -IV Zosyn changed to oral Augmentin and ciprofloxacin based on culture sensitivities after discussion with ID -May need to discuss Port-A-Cath removal if blood cultures positive, currently negative -Lactic acid downtrending, continue to monitor in a.m. along with CBC  Metastatic urothelial cancer with bilateral nephrostomy tubes -Initial chemotherapy performed 6/14 -Urology planning to follow-up in outpatient in 4 weeks and this is appropriate -Appreciate oncology evaluation and consultation  -May require palliative consultation in outpatient setting to review goals of care  Decompensated diastolic and systolic heart failure-improving -Bilateral lower extremity edema noted -Last 2D echocardiogram 12/2019 with EF 45-50% -Blood pressures are improved and sepsis physiology appears to be resolving -Continue Lasix for diuresis with over 2.6 L of net negative fluid balance overnight -Follow daily weights and I's and O's  Chronic anemia -Baseline hemoglobin 8-10 -Started on weekly Feraheme by Dr. Delton Coombes -Recent upper endoscopy done on 01/2020 with grade a reflux esophagitis -Follow CBC and transfuse as needed  Hypertension-stable  -Okay to use Lasix for diuresis and hold lisinopril for now -Hold metoprolol for now  History of CAD -PCI in 2018 -No signs of ACS noted -Aspirin and Plavix may be resumed  Prior aortic valve replacement  DVT prophylaxis:Heparin Code Status:Full code Family Communication:None at bedside Disposition Plan:Admit for treatment of UTI and diuresis.  Status is: Inpatient  Remains  inpatient appropriate because:IV treatments appropriate due to intensity of illness or inability to take PO and  Inpatient level of care appropriate due to severity of illness   Dispo: The patient is from:Home Anticipated d/c is WJ:XBJY Anticipated d/c date is: 1-2 days Patient currently is not medically stable to d/c.  Consultants:  Oncology-Dr. Delton Coombes  ID-Dr. Johnnye Sima on securechat  Procedures:  See below  Antimicrobials:  Anti-infectives (From admission, onward)   Start     Dose/Rate Route Frequency Ordered Stop   03/09/20 1215  amoxicillin-clavulanate (AUGMENTIN) 875-125 MG per tablet 1 tablet     Discontinue     1 tablet Oral Every 12 hours 03/09/20 1213     03/09/20 1215  ciprofloxacin (CIPRO) tablet 500 mg     Discontinue     500 mg Oral 2 times daily 03/09/20 1213     03/08/20 1900  piperacillin-tazobactam (ZOSYN) IVPB 3.375 g  Status:  Discontinued        3.375 g 12.5 mL/hr over 240 Minutes Intravenous Every 8 hours 03/08/20 1026 03/09/20 1213   03/08/20 1100  piperacillin-tazobactam (ZOSYN) IVPB 3.375 g        3.375 g 100 mL/hr over 30 Minutes Intravenous  Once 03/08/20 1024 03/08/20 1203   03/07/20 1600  cefTRIAXone (ROCEPHIN) 1 g in sodium chloride 0.9 % 100 mL IVPB  Status:  Discontinued        1 g 200 mL/hr over 30 Minutes Intravenous Every 24 hours 03/06/20 2103 03/07/20 0713   03/07/20 1600  cefTRIAXone (ROCEPHIN) 2 g in sodium chloride 0.9 % 100 mL IVPB  Status:  Discontinued        2 g 200 mL/hr over 30 Minutes Intravenous Every 24 hours 03/07/20 0713 03/08/20 1023   03/06/20 1845  cefTRIAXone (ROCEPHIN) 1 g in sodium chloride 0.9 % 100 mL IVPB        1 g 200 mL/hr over 30 Minutes Intravenous  Once 03/06/20 1834 03/06/20 1922       Subjective: Patient seen and evaluated today with no new acute complaints or concerns. No acute concerns or events noted overnight.  Objective: Vitals:   03/08/20 2045  03/08/20 2056 03/09/20 0457 03/09/20 0827  BP: 105/66  117/63 123/72  Pulse: 72  74 80  Resp: 15  16 16   Temp: 98.4 F (36.9 C)  98.6 F (37 C) 97.7 F (36.5 C)  TempSrc: Oral  Oral Oral  SpO2: 100% 96% 98% 100%  Weight:      Height:        Intake/Output Summary (Last 24 hours) at 03/09/2020 1217 Last data filed at 03/09/2020 1124 Gross per 24 hour  Intake 1800 ml  Output 4450 ml  Net -2650 ml   Filed Weights   03/06/20 1548 03/06/20 2043  Weight: 54.4 kg 61.6 kg    Examination:  General exam: Appears calm and comfortable  Respiratory system: Clear to auscultation. Respiratory effort normal. Cardiovascular system: S1 & S2 heard, RRR. No JVD, murmurs, rubs, gallops or clicks.  Scant pedal edema, 1+ in ankles. Gastrointestinal system: Abdomen is nondistended, soft and nontender. No organomegaly or masses felt. Normal bowel sounds heard. Central nervous system: Alert and oriented. No focal neurological deficits. Extremities: Symmetric 5 x 5 power. Skin: No rashes, lesions or ulcers Psychiatry: Judgement and insight appear normal. Mood & affect appropriate.     Data Reviewed: I have personally reviewed following labs and imaging studies  CBC: Recent Labs  Lab 03/04/20 0948 03/06/20 1633 03/07/20 0518 03/08/20 0624 03/09/20 0537  WBC 10.4 16.7* 12.6* 10.8* 8.7  NEUTROABS 8.0* 16.1*  --   --   --  HGB 9.2* 8.7* 7.8* 8.1* 9.5*  HCT 29.5* 27.8* 25.1* 26.3* 29.8*  MCV 73.2* 72.2* 72.5* 72.9* 71.5*  PLT 430* 372 315 319 952   Basic Metabolic Panel: Recent Labs  Lab 03/04/20 0948 03/06/20 1633 03/07/20 0518 03/08/20 0624 03/09/20 0537  NA 128* 128* 128* 126* 124*  K 3.9 4.7 4.6 4.1 4.3  CL 94* 95* 97* 92* 87*  CO2 24 23 23 25 25   GLUCOSE 92 89 99 100* 92  BUN 10 23 24* 24* 21  CREATININE 0.82 0.97 1.00 0.96 1.01  CALCIUM 8.1* 7.7* 7.4* 8.0* 8.2*  MG 1.8 1.9  --  1.7 1.8   GFR: Estimated Creatinine Clearance: 58.6 mL/min (by C-G formula based on SCr of  1.01 mg/dL). Liver Function Tests: Recent Labs  Lab 03/04/20 0948  AST 16  ALT 13  ALKPHOS 169*  BILITOT 0.7  PROT 6.8  ALBUMIN 3.0*   No results for input(s): LIPASE, AMYLASE in the last 168 hours. No results for input(s): AMMONIA in the last 168 hours. Coagulation Profile: No results for input(s): INR, PROTIME in the last 168 hours. Cardiac Enzymes: No results for input(s): CKTOTAL, CKMB, CKMBINDEX, TROPONINI in the last 168 hours. BNP (last 3 results) No results for input(s): PROBNP in the last 8760 hours. HbA1C: No results for input(s): HGBA1C in the last 72 hours. CBG: No results for input(s): GLUCAP in the last 168 hours. Lipid Profile: No results for input(s): CHOL, HDL, LDLCALC, TRIG, CHOLHDL, LDLDIRECT in the last 72 hours. Thyroid Function Tests: No results for input(s): TSH, T4TOTAL, FREET4, T3FREE, THYROIDAB in the last 72 hours. Anemia Panel: No results for input(s): VITAMINB12, FOLATE, FERRITIN, TIBC, IRON, RETICCTPCT in the last 72 hours. Sepsis Labs: Recent Labs  Lab 03/06/20 1633 03/07/20 0749  LATICACIDVEN 1.5 0.9    Recent Results (from the past 240 hour(s))  Urine culture     Status: Abnormal   Collection Time: 03/06/20  4:20 PM   Specimen: Urine, Catheterized  Result Value Ref Range Status   Specimen Description   Final    URINE, CATHETERIZED Performed at [facility], [address], [place], [place] [address]    Special Requests   Final    RIGHT NEPHROSTOMY Performed at [facility], [address], [place], [place] [address]    Culture (A)  Final    >=100,000 COLONIES/mL KLEBSIELLA PNEUMONIAE >=100,000 COLONIES/mL PSEUDOMONAS PUTIDA >=100,000 COLONIES/mL ENTEROCOCCUS FAECALIS    Report Status 03/09/2020 FINAL  Final   Organism ID, Bacteria KLEBSIELLA PNEUMONIAE (A)  Final   Organism ID, Bacteria PSEUDOMONAS PUTIDA (A)  Final   Organism ID, Bacteria ENTEROCOCCUS FAECALIS (A)  Final      Susceptibility   Enterococcus faecalis - MIC*     AMPICILLIN <=2 SENSITIVE Sensitive     NITROFURANTOIN <=16 SENSITIVE Sensitive     VANCOMYCIN 2 SENSITIVE Sensitive     * >=100,000 COLONIES/mL ENTEROCOCCUS FAECALIS   Klebsiella pneumoniae - MIC*    AMPICILLIN >=32 RESISTANT Resistant     CEFAZOLIN <=4 SENSITIVE Sensitive     CEFTRIAXONE <=0.25 SENSITIVE Sensitive     CIPROFLOXACIN <=0.25 SENSITIVE Sensitive     GENTAMICIN <=1 SENSITIVE Sensitive     IMIPENEM <=0.25 SENSITIVE Sensitive     NITROFURANTOIN 32 SENSITIVE Sensitive     TRIMETH/SULFA <=20 SENSITIVE Sensitive     AMPICILLIN/SULBACTAM 4 SENSITIVE Sensitive     PIP/TAZO <=4 SENSITIVE Sensitive     * >=100,000 COLONIES/mL KLEBSIELLA PNEUMONIAE   Pseudomonas putida - MIC*  CEFTAZIDIME 2 SENSITIVE Sensitive     CIPROFLOXACIN <=0.25 SENSITIVE Sensitive     GENTAMICIN <=1 SENSITIVE Sensitive     IMIPENEM 1 SENSITIVE Sensitive     PIP/TAZO 8 SENSITIVE Sensitive     * >=100,000 COLONIES/mL PSEUDOMONAS PUTIDA  SARS Coronavirus 2 by RT PCR (hospital order, performed in [facility] hospital lab) Nasopharyngeal Nasopharyngeal Swab     Status: None   Collection Time: 03/06/20  4:25 PM   Specimen: Nasopharyngeal Swab  Result Value Ref Range Status   SARS Coronavirus 2 NEGATIVE NEGATIVE Final    Comment: (NOTE) SARS-CoV-2 target nucleic acids are NOT DETECTED.  The SARS-CoV-2 RNA is generally detectable in upper and lower respiratory specimens during the acute phase of infection. The lowest concentration of SARS-CoV-2 viral copies this assay can detect is 250 copies / mL. A negative result does not preclude SARS-CoV-2 infection and should not be used as the sole basis for treatment or other patient management decisions.  A negative result may occur with improper specimen collection / handling, submission of specimen other than nasopharyngeal swab, presence of viral mutation(s) within the areas targeted by this assay, and inadequate number of viral copies (<250 copies / mL). A  negative result must be combined with clinical observations, patient history, and epidemiological information.  Fact Sheet for Patients:   StrictlyIdeas.no  Fact Sheet for Healthcare Providers: BankingDealers.co.za  This test is not yet approved or  cleared by the [place] FDA and has been authorized for detection and/or diagnosis of SARS-CoV-2 by FDA under an Emergency Use Authorization (EUA).  This EUA will remain in effect (meaning this test can be used) for the duration of the COVID-19 declaration under Section 564(b)(1) of the Act, 21 U.S.C. section 360bbb-3(b)(1), unless the authorization is terminated or revoked sooner.  Performed at [facility], [address] [place], [place] [address]   Urine culture     Status: Abnormal   Collection Time: 03/06/20  4:28 PM   Specimen: Urine, Catheterized  Result Value Ref Range Status   Specimen Description   Final    URINE, CATHETERIZED Performed at [facility], [address] [place], [place] [address]    Special Requests   Final    LEFT NEPHROSTOMY TUBE Performed at [facility], [address] [place], [place] [address]    Culture (A)  Final    >=100,000 COLONIES/mL KLEBSIELLA PNEUMONIAE >=100,000 COLONIES/mL PSEUDOMONAS PUTIDA >=100,000 COLONIES/mL ENTEROCOCCUS FAECALIS    Report Status 03/09/2020 FINAL  Final   Organism ID, Bacteria KLEBSIELLA PNEUMONIAE (A)  Final   Organism ID, Bacteria PSEUDOMONAS PUTIDA (A)  Final   Organism ID, Bacteria ENTEROCOCCUS FAECALIS (A)  Final      Susceptibility   Enterococcus faecalis - MIC*    AMPICILLIN <=2 SENSITIVE Sensitive     NITROFURANTOIN <=16 SENSITIVE Sensitive     VANCOMYCIN 2 SENSITIVE Sensitive     * >=100,000 COLONIES/mL ENTEROCOCCUS FAECALIS   Klebsiella pneumoniae - MIC*    AMPICILLIN >=32 RESISTANT Resistant     CEFAZOLIN <=4 SENSITIVE Sensitive     CEFTRIAXONE <=0.25 SENSITIVE Sensitive     CIPROFLOXACIN <=0.25  SENSITIVE Sensitive     GENTAMICIN <=1 SENSITIVE Sensitive     IMIPENEM <=0.25 SENSITIVE Sensitive     NITROFURANTOIN 64 INTERMEDIATE Intermediate     TRIMETH/SULFA <=20 SENSITIVE Sensitive     AMPICILLIN/SULBACTAM 8 SENSITIVE Sensitive     PIP/TAZO <=4 SENSITIVE Sensitive     * >=100,000 COLONIES/mL KLEBSIELLA PNEUMONIAE   Pseudomonas  putida - MIC*    CEFTAZIDIME <=1 SENSITIVE Sensitive     CIPROFLOXACIN <=0.25 SENSITIVE Sensitive     GENTAMICIN <=1 SENSITIVE Sensitive     IMIPENEM 1 SENSITIVE Sensitive     PIP/TAZO 8 SENSITIVE Sensitive     * >=100,000 COLONIES/mL PSEUDOMONAS PUTIDA  Culture, blood (routine x 2)     Status: None (Preliminary result)   Collection Time: 03/06/20  4:34 PM   Specimen: Left Antecubital; Blood  Result Value Ref Range Status   Specimen Description LEFT ANTECUBITAL  Final   Special Requests   Final    BOTTLES DRAWN AEROBIC AND ANAEROBIC Blood Culture adequate volume   Culture   Final    NO GROWTH 3 DAYS Performed at [facility], [address] [place], [place] [address]    Report Status PENDING  Incomplete  Culture, blood (routine x 2)     Status: None (Preliminary result)   Collection Time: 03/06/20  4:39 PM   Specimen: Left Antecubital; Blood  Result Value Ref Range Status   Specimen Description LEFT ANTECUBITAL  Final   Special Requests   Final    BOTTLES DRAWN AEROBIC AND ANAEROBIC Blood Culture adequate volume   Culture   Final    NO GROWTH 3 DAYS Performed at [facility], [address], [place], [place] [address]    Report Status PENDING  Incomplete         Radiology Studies: No results found.      Scheduled Meds: . amoxicillin-clavulanate  1 tablet Oral Q12H  . aspirin EC  81 mg Oral Daily  . atorvastatin  40 mg Oral QPM  . busPIRone  10 mg Oral BID  . Chlorhexidine Gluconate Cloth  6 each Topical Daily  . ciprofloxacin  500 mg Oral BID  . escitalopram  10 mg Oral Daily  . furosemide  40 mg Intravenous Q12H  . heparin   5,000 Units Subcutaneous Q8H  . pantoprazole  40 mg Oral QAC breakfast  . polyethylene glycol  17 g Oral Daily    LOS: 3 days    Time spent: 30 minutes    Cordero Surette Darleen Crocker, DO [facility]  If 7PM-7AM, please contact night-coverage [email or web address] 03/09/2020, 12:17 PM

## 2020-03-09 NOTE — Progress Notes (Signed)
MD notified of pt complaint of itchiness around bilateral nephrostomy tube sites. Pt stated MD previously changed dressing. Per previous note tubes were inserted 6/3, and per Urology note pt needs monthly dressing changes, but no additional instructions were provided. MD advised contact nephrology for additional instruction.

## 2020-03-10 LAB — BASIC METABOLIC PANEL
Anion gap: 13 (ref 5–15)
BUN: 23 mg/dL (ref 8–23)
CO2: 27 mmol/L (ref 22–32)
Calcium: 8.7 mg/dL — ABNORMAL LOW (ref 8.9–10.3)
Chloride: 85 mmol/L — ABNORMAL LOW (ref 98–111)
Creatinine, Ser: 1.15 mg/dL (ref 0.61–1.24)
GFR calc Af Amer: >60 mL/min (ref >60)
GFR calc non Af Amer: >60 mL/min (ref >60)
Glucose, Bld: 91 mg/dL (ref 70–99)
Potassium: 4.2 mmol/L (ref 3.5–5.1)
Sodium: 125 mmol/L — ABNORMAL LOW (ref 135–145)

## 2020-03-10 LAB — CBC
HCT: 29 % — ABNORMAL LOW (ref 39.0–52.0)
Hemoglobin: 9.3 g/dL — ABNORMAL LOW (ref 13.0–17.0)
MCH: 23 pg — ABNORMAL LOW (ref 26.0–34.0)
MCHC: 32.1 g/dL (ref 30.0–36.0)
MCV: 71.6 fL — ABNORMAL LOW (ref 80.0–100.0)
Platelets: 293 10*3/uL (ref 150–400)
RBC: 4.05 MIL/uL — ABNORMAL LOW (ref 4.22–5.81)
RDW: 21.8 % — ABNORMAL HIGH (ref 11.5–15.5)
WBC: 4.1 10*3/uL (ref 4.0–10.5)
nRBC: 0 % (ref 0.0–0.2)

## 2020-03-10 LAB — MAGNESIUM: Magnesium: 1.7 mg/dL (ref 1.7–2.4)

## 2020-03-10 MED ORDER — CIPROFLOXACIN HCL 500 MG PO TABS: 500.0000 mg | ORAL_TABLET | Freq: Two times a day (BID) | ORAL | 0 refills | Status: AC

## 2020-03-10 MED ORDER — AMOXICILLIN-POT CLAVULANATE 875-125 MG PO TABS: 1.0000 | ORAL_TABLET | Freq: Two times a day (BID) | ORAL | 0 refills | Status: AC

## 2020-03-10 MED ORDER — FUROSEMIDE 40 MG PO TABS: 40.0000 mg | ORAL_TABLET | Freq: Two times a day (BID) | ORAL | 0 refills | Status: DC

## 2020-03-10 NOTE — Discharge Summary (Signed)
Physician Discharge Summary  [name] XTG:[id number] DOB: 1951-08-26 DOA: 03/06/2020  PCP: [name], MD  Admit date: 03/06/2020  Discharge date: 03/10/2020  Admitted From:Home  Disposition:  Home  Recommendations for Outpatient Follow-up:  1. Follow up with PCP in 1-2 weeks 2. Please obtain BMP/CBC in one week 3. Follow-up with cardiology Dr. Domenic Polite in 2 weeks for reassessment of blood pressure medications and diuretic dosing 4. Follow-up with Dr. Delton Coombes scheduled for chemotherapy 5. Follow-up with urology, Dr. Alyson Ingles is scheduled in the next 3-4 weeks. 6. Continue on ciprofloxacin and Augmentin as prescribed for 10 more days to complete 14-day course of treatment for complicated UTI with Klebsiella, Pseudomonas, and Enterococcus. 7. Continue now on Lasix 40 mg in a.m. and 20 mg p.m. as prescribed  Home Health:None  Equipment/Devices:None  Discharge Condition:Stable  CODE STATUS: Full  Diet recommendation: Heart Healthy  Brief/Interim Summary: Per HPI: [name] a 69 y.o.malewith medical history significant formetastatic urothelial cancer, aortic valve replacement, coronary artery disease, diastolic and systolic CHF. Patient presented to the ED with complaints of fever and chills that started this afternoon at about 1 PM. Reports feeling unwell. He reports difficulty breathing that has resolved. His bilateral nephrostomy tubes were inserted 6/3,he has had pain from both sides, this pain is unchanged. No surrounding erythema or discharge. Patient has had increasing bilateral lower extremity swelling since he was diagnosed with bladder cancer. Reports the swelling is now involving his scrotum. No abdominal pain, no vomiting or loose stools. He denies any surrounding erythema around nephrostomy tubes, no discharge from skin.  Prior to arrival patient was placed on oxygen and given bronchodilator treatments by EMS, on arrival to the ED,  his difficulty breathing had resolved.  ED Course:Temperature 101.5, blood pressure systolic 27-035, O2 sats greater than 93% on room air. Heart rate 70s to 80s. WBC 16.7. Hemoglobin down to 8.7. Sodium 128. Normal lactic acid 1.5. BNP elevated at 1930. Urine samples withdrawn from both nephrostomy tube,-UA showed greater than 50 WBCs, large leukocytes few bacteria. Patient was started on IV ceftriaxone. Hospitalist to admit for urinary tract infection.  -Patient was admitted with sepsis secondary to UTI in the setting of metastatic urothelial cancer with bilateral nephrostomy tubes.  He had his first chemotherapy several days prior to admission and was also seen by oncology during this admission.  He was noted to have urine cultures positive for Klebsiella, Pseudomonas, Enterococcus and was treated initially with IV Rocephin and then Zosyn.  He was transitioned to oral antibiotics with ciprofloxacin and Augmentin per ID recommendations on 6/19 and has been tolerating these well and appears stable for discharge today.  He was also noted to have decompensated diastolic and systolic heart failure that has improved with IV Lasix diuresis.  He has no further lower extremity edema and is stable for discharge on his usual home Lasix, but now dose has been increased to a total of 60 mg daily with 40 mg a.m. and 20 mg p.m.  He has had no other acute events throughout this hospitalization and will have follow-ups as noted above.  Discharge Diagnoses:  Principal Problem:   UTI (urinary tract infection) Active Problems:   Essential hypertension, benign   S/P aortic valve replacement   Coronary atherosclerosis of native coronary artery   Depression, major, single episode, mild (HCC)   Chronic anemia   Acute on chronic HFrEF (heart failure with reduced ejection fraction) -combined systolic and diastolic dysfunction CHF   Metastatic urothelial carcinoma (  [place])   Port-A-Cath in place   Fever in  adult  Principal discharge diagnosis: Sepsis secondary to Klebsiella, Pseudomonas, and Enterococcus UTI in the setting of metastatic urothelial cancer with bilateral nephrostomy tubes.  Associated systolic and diastolic CHF exacerbation.  Discharge Instructions  Discharge Instructions    Diet - low sodium heart healthy   Complete by: As directed    Increase activity slowly   Complete by: As directed      Allergies as of 03/10/2020   No Known Allergies     Medication List    STOP taking these medications   lisinopril 20 MG tablet Commonly known as: ZESTRIL     TAKE these medications   amoxicillin-clavulanate 875-125 MG tablet Commonly known as: AUGMENTIN Take 1 tablet by mouth every 12 (twelve) hours for 10 days.   aspirin EC 81 MG tablet Take 1 tablet (81 mg total) by mouth daily.   atorvastatin 40 MG tablet Commonly known as: LIPITOR Take 1 tablet by mouth once daily What changed: when to take this   busPIRone 10 MG tablet Commonly known as: BUSPAR Take 1 tablet (10 mg total) by mouth 2 (two) times daily.   CARBOPLATIN IV Inject into the vein. Days 1, 8 every 21 days   cholecalciferol 25 MCG (1000 UNIT) tablet Commonly known as: VITAMIN D Take 1 tablet (1,000 Units total) by mouth daily.   ciprofloxacin 500 MG tablet Commonly known as: CIPRO Take 1 tablet (500 mg total) by mouth 2 (two) times daily for 10 days.   clopidogrel 75 MG tablet Commonly known as: PLAVIX Take 1 tablet by mouth once daily   escitalopram 10 MG tablet Commonly known as: LEXAPRO Take 1 tablet (10 mg total) by mouth daily.   fluticasone 50 MCG/ACT nasal spray Commonly known as: FLONASE Place 2 sprays into both nostrils daily.   furosemide 40 MG tablet Commonly known as: LASIX Take 1 tablet (40 mg total) by mouth 2 (two) times daily. Take 40mg  in AM and 20mg  in PM. What changed:   when to take this  additional instructions   GEMCITABINE HCL IV Inject 1,000 mg/m2 into the  vein. Days 1, 8 every 21 days   HYDROcodone-acetaminophen 5-325 MG tablet Commonly known as: NORCO/VICODIN Take 1 tablet by mouth every 6 (six) hours as needed for moderate pain.   lidocaine-prilocaine cream Commonly known as: EMLA Apply a small amount to port a cath site and cover with plastic wrap 1 hour prior to chemotherapy appointments What changed:   how much to take  how to take this  when to take this   metoprolol succinate 50 MG 24 hr tablet Commonly known as: TOPROL-XL TAKE 1 TABLET BY MOUTH ONCE DAILY WITH  OR  IMMEDIATELY  FOLLOWING  A  MEAL What changed: See the new instructions.   montelukast 10 MG tablet Commonly known as: SINGULAIR TAKE 1 TABLET BY MOUTH AT BEDTIME   nitroGLYCERIN 0.4 MG SL tablet Commonly known as: NITROSTAT Place 1 tablet (0.4 mg total) under the tongue every 5 (five) minutes as needed for chest pain.   pantoprazole 40 MG tablet Commonly known as: PROTONIX Take 1 tablet (40 mg total) by mouth daily before breakfast.   prochlorperazine 10 MG tablet Commonly known as: COMPAZINE Take 1 tablet (10 mg total) by mouth every 6 (six) hours as needed (Nausea or vomiting).   trolamine salicylate 10 % cream Commonly known as: ASPERCREME Apply 1 application topically daily as needed for muscle pain (for pain  in feet).       Follow-up Information    [name], MD Follow up in 1 week(s).   Specialty: Family Medicine Contact information: [address], [place] [address] [phone]        Satira Sark, MD. Schedule an appointment as soon as possible for a visit in 2 week(s).   Specialty: Cardiology Contact information: [place] [address] [phone]        Derek Jack, MD Follow up in 2 week(s).   Specialty: Hematology Contact information: [address] [place] [address] [phone]              No Known Allergies  Consultations:  Oncology  ID on  phone   Procedures/Studies: CT ABDOMEN PELVIS W CONTRAST  Result Date: 03/06/2020 CLINICAL DATA:  Fever and leukocytosis. Metastatic urothelial cancer. Recent initiation of carboplatin and gemcitabine therapy on 03/04/2020. Back pain and fever. EXAM: CT ABDOMEN AND PELVIS WITH CONTRAST TECHNIQUE: Multidetector CT imaging of the abdomen and pelvis was performed using the standard protocol following bolus administration of intravenous contrast. CONTRAST:  128mL OMNIPAQUE IOHEXOL 300 MG/ML  SOLN COMPARISON:  02/02/2020 FINDINGS: Lower chest: Moderate cardiomegaly with right coronary artery atherosclerosis. Prosthetic aortic valve. Mild scarring or atelectasis in the posterior basal segment right lower lobe. Hepatobiliary: Embolization coils along the porta hepatis. No discrete hepatic mass is identified. Gallbladder unremarkable. Pancreas: Unremarkable Spleen: Lobular spleen, unchanged from prior. Adrenals/Urinary Tract: Adrenal glands unremarkable. Complex lesion along the right kidney upper pole measuring 2.3 by 1.5 cm, internal density approximately 70 Hounsfield units, probably a complex cyst and not appreciably changed from 02/02/2020. Prior PET-CT negative in this vicinity suggesting likely benign nature. Scarring in the right mid kidney. Scarring in the left kidney upper pole. Bilateral percutaneous nephrostomy tubes with coils formed in the renal pelvis bilaterally. No significant hydronephrosis. There is a large amount of tumor eccentric to the right in the urinary bladder. I cannot exclude invasion of the right seminal vesicle by tumor based on the current appearance. Dominant tumor in the right bladder measures about 8.4 by 4.5 cm on image 56/2. There is likely some tumor along the bladder base as well. Perivesical nodularity including a 1.0 cm soft tissue nodule adjacent to the right bladder tumor on image 54/5. There loops of bowel adjacent to the bladder tumor, without definite invasion of bowel by  the tumor. Stomach/Bowel: Prominent stool throughout the colon favors constipation. No dilated small bowel. Vascular/Lymphatic: Aortoiliac atherosclerotic vascular disease. Left periaortic node 1.1 cm in short axis on image 21/2, previously 1.4 cm. Right external iliac node partially encasing the right external iliac artery, 3.7 cm in short axis on image 50/2, previously 3.0 cm. Left pelvic sidewall lymph node 1.1 cm in short axis on image 56/2, previously the same. Other bilateral external iliac lymph nodes are mildly enlarged and there are smaller inguinal lymph nodes noted. Reproductive: Prostatomegaly. As noted above, I cannot exclude bladder tumor invading back into the right seminal vesicle given the appearance for example on image 52/6. Other: Subcutaneous edema along the anterior abdominal wall extending down into the scrotum and penis which appear expanded by infiltrative edema. This edema also extends into the upper thigh region, right greater than left. Musculoskeletal: Scattered osseous metastatic disease including visualized ribs, lumbar spine, bony pelvis, and inter trochanteric region of the right proximal femur. The sclerotic metastatic lesions are increased in size and number. An index lesion in the left  inferior pubic ramus measures 2.0 by 1.1 cm, formerly 0.7 by 0.7 cm. Grade 1 degenerative anterolisthesis at L4-5 with bilateral resulting foraminal stenosis due to disc uncovering and facet arthropathy. Facet arthropathy also contributes to bilateral foraminal impingement at L5-S1. There is endplate sclerosis at O8-3 along with loss of disc height which is stable from the prior exam. There is central narrowing of the thecal sac at the L4-5 level not appreciably changed from 02/02/2020. I do not see compelling evidence of enhancing tumor in the epidural space although MRI is of course more sensitive. I not see a definite pathologic fracture. IMPRESSION: 1. Large amount of tumor eccentric to the  right in the urinary bladder with some adjacent nodularity. I cannot exclude invasion of the right seminal vesicle by tumor. Mild retroperitoneal and bilateral external iliac adenopathy. Moderately worsened osseous metastatic disease. 2. Subcutaneous edema along the anterior abdominal wall extending down into the scrotum and penis which appear expanded by infiltrative edema. This also extends into the upper thigh region, right greater than left. It is quite possible that the large right external iliac node measuring 3.7 cm in short axis is causing extrinsic compression on the right external iliac vein which may be contributing to the asymmetry of subcutaneous edema in the right upper thigh. 3. Other imaging findings of potential clinical significance: Moderate cardiomegaly with right coronary artery atherosclerosis. Bilateral percutaneous nephrostomy tubes without significant hydronephrosis. Prominent stool throughout the colon favors constipation. Complex lesion along the right kidney upper pole, probably a complex cyst given lack of PET activity, and not appreciably changed from 02/02/2020. Lumbar spondylosis and degenerative disc disease causing impingement at L4-5 and L5-S1. Central narrowing of the thecal sac at L4-5 due to spondylosis and degenerative disc disease. 4. Aortic atherosclerosis. Aortic Atherosclerosis (ICD10-I70.0). Electronically Signed   By: Van Clines M.D.   On: 03/06/2020 20:48   US Venous Img Lower Unilateral Right  Result Date: 02/13/2020 CLINICAL DATA:  69 year old male with a history swelling EXAM: RIGHT LOWER EXTREMITY VENOUS DOPPLER ULTRASOUND TECHNIQUE: Gray-scale sonography with graded compression, as well as color Doppler and duplex ultrasound were performed to evaluate the lower extremity deep venous systems from the level of the common femoral vein and including the common femoral, femoral, profunda femoral, popliteal and calf veins including the posterior tibial,  peroneal and gastrocnemius veins when visible. The superficial great saphenous vein was also interrogated. Spectral Doppler was utilized to evaluate flow at rest and with distal augmentation maneuvers in the common femoral, femoral and popliteal veins. COMPARISON:  None. FINDINGS: Contralateral Common Femoral Vein: Respiratory phasicity is normal and symmetric with the symptomatic side. No evidence of thrombus. Normal compressibility. Common Femoral Vein: No evidence of thrombus. Normal compressibility, respiratory phasicity and response to augmentation. Saphenofemoral Junction: No evidence of thrombus. Normal compressibility and flow on color Doppler imaging. Profunda Femoral Vein: No evidence of thrombus. Normal compressibility and flow on color Doppler imaging. Femoral Vein: No evidence of thrombus. Normal compressibility, respiratory phasicity and response to augmentation. Popliteal Vein: No evidence of thrombus. Normal compressibility, respiratory phasicity and response to augmentation. Calf Veins: No evidence of thrombus. Normal compressibility and flow on color Doppler imaging. Superficial Great Saphenous Vein: No evidence of thrombus. Normal compressibility and flow on color Doppler imaging. Other Findings:  Edema IMPRESSION: Sonographic survey of the right lower extremity negative for DVT. Edema Electronically Signed   By: Corrie Mckusick D.O.   On: 02/13/2020 13:18   DG Chest Port 1 View  Result Date: 03/06/2020  CLINICAL DATA:  Shortness of breath EXAM: PORTABLE CHEST 1 VIEW COMPARISON:  02/01/2020 FINDINGS: Interval placement of right internal jugular approach Port-A-Cath with distal tip terminating at the level of the distal SVC. Prior aortic valve replacement and ascending thoracic aorta stent graft. Median sternotomy. Stable cardiomegaly. Atherosclerotic calcification of the aortic knob. No focal airspace consolidation, pleural effusion, or pneumothorax. IMPRESSION: Interval placement of right chest  port. Otherwise stable exam from prior. No acute cardiopulmonary abnormality. No pneumothorax. Electronically Signed   By: Davina Poke D.O.   On: 03/06/2020 16:54   US SCROTUM W/DOPPLER  Result Date: 02/16/2020 CLINICAL DATA:  Scrotal swelling EXAM: SCROTAL ULTRASOUND DOPPLER ULTRASOUND OF THE TESTICLES TECHNIQUE: Complete ultrasound examination of the testicles, epididymis, and other scrotal structures was performed. Color and spectral Doppler ultrasound were also utilized to evaluate blood flow to the testicles. COMPARISON:  None. FINDINGS: Right testicle Measurements: 4.3 x 2.6 x 2.3 cm. No mass or microlithiasis visualized. Left testicle Measurements: 4.8 x 2.9 x 3 cm. No mass or microlithiasis visualized. Right epididymis:  Normal in size and appearance. Left epididymis:  Normal in size and appearance. Hydrocele: There are moderate-sized bilateral hydroceles, greater on the left. Varicocele:  None visualized. Pulsed Doppler interrogation of both testes demonstrates normal low resistance arterial and venous waveforms bilaterally. Extensive nonspecific scrotal wall thickening is noted. IMPRESSION: 1. No evidence for testicular torsion. 2. No evidence for epididymitis or orchitis. 3. There is extensive scrotal wall thickening of unknown clinical significance. Correlation with physical exam is recommended. 4. Moderate-sized bilateral hydroceles, left greater than right. Electronically Signed   By: Constance Holster M.D.   On: 02/16/2020 15:00   IR NEPHROSTOMY PLACEMENT LEFT  Result Date: 02/22/2020 INDICATION: 69 year old with bladder cancer and bilateral hydronephrosis. Request for bilateral nephrostomy tubes. EXAM: PLACEMENT OF BILATERAL NEPHROSTOMY TUBES WITH ULTRASOUND AND FLUOROSCOPIC GUIDANCE COMPARISON:  None. MEDICATIONS: Ancef 2 g; The antibiotic was administered in an appropriate time frame prior to skin puncture. ANESTHESIA/SEDATION: Fentanyl 150 mcg IV; Versed 3.0 mg IV Moderate Sedation  Time:  40 minutes The patient was continuously monitored during the procedure by the interventional radiology nurse under my direct supervision. CONTRAST:  25 mL Omnipaque 300-administered into the collecting system(s) FLUOROSCOPY TIME:  Fluoroscopy Time: 5 minutes, 24 seconds, 50 mGy COMPLICATIONS: None immediate. PROCEDURE: Informed written consent was obtained from the patient after a thorough discussion of the procedural risks, benefits and alternatives. All questions were addressed. Maximal Sterile Barrier Technique was utilized including caps, mask, sterile gowns, sterile gloves, sterile drape, hand hygiene and skin antiseptic. A timeout was performed prior to the initiation of the procedure. Patient was placed prone. Both flanks were prepped and draped in sterile fashion. Ultrasound was used to identify the left kidney. Limited visualization of the left kidney due to overlying bowel gas. Only the left kidney upper pole could be well demonstrated with ultrasound due to the artifact from bowel and rib. Skin was anesthetized with 1% lidocaine. Using ultrasound guidance, an upper pole calyx was targeted. Needle was directed into the upper pole calyx with ultrasound guidance and contrast injection confirmed placement in the renal collecting system. A 0.018 wire was advanced into the collecting system and a transitional dilator set was placed. The tract was dilated to accommodate a 10 [place] multipurpose drain. Contrast injection confirmed placement in the renal pelvis. Catheter was sutured to skin and attached to a gravity bag. Attention was directed to the right kidney. Ultrasound was used to identify the right kidney. Calyx in  the lower pole of the right kidney was targeted. Skin was anesthetized with 1% lidocaine. Using ultrasound guidance, needle was directed into the calyx and a 0.018 wire was advanced in the renal collecting system. Transitional dilator set was placed. Tract was dilated to accommodate a 10  [place] multipurpose drain. Drain placed in the renal pelvis. Contrast injection confirmed placement in the renal pelvis. Drain was flushed with saline and attached to a gravity bag. Catheter was sutured to skin and a dressing was placed. Fluoroscopic and ultrasound images were taken and saved for documentation. FINDINGS: Moderate hydronephrosis bilaterally. Left nephrostomy tube was placed in an upper pole calyx because the remainder of the kidney was obscured by bowel gas. Right nephrostomy tube was placed in a lower pole calyx. IMPRESSION: Successful placement of bilateral nephrostomy tubes with ultrasound and fluoroscopic guidance. Electronically Signed   By: Markus Daft M.D.   On: 02/22/2020 18:21   IR NEPHROSTOMY PLACEMENT RIGHT  Result Date: 02/22/2020 INDICATION: 69 year old with bladder cancer and bilateral hydronephrosis. Request for bilateral nephrostomy tubes. EXAM: PLACEMENT OF BILATERAL NEPHROSTOMY TUBES WITH ULTRASOUND AND FLUOROSCOPIC GUIDANCE COMPARISON:  None. MEDICATIONS: Ancef 2 g; The antibiotic was administered in an appropriate time frame prior to skin puncture. ANESTHESIA/SEDATION: Fentanyl 150 mcg IV; Versed 3.0 mg IV Moderate Sedation Time:  40 minutes The patient was continuously monitored during the procedure by the interventional radiology nurse under my direct supervision. CONTRAST:  25 mL Omnipaque 300-administered into the collecting system(s) FLUOROSCOPY TIME:  Fluoroscopy Time: 5 minutes, 24 seconds, 50 mGy COMPLICATIONS: None immediate. PROCEDURE: Informed written consent was obtained from the patient after a thorough discussion of the procedural risks, benefits and alternatives. All questions were addressed. Maximal Sterile Barrier Technique was utilized including caps, mask, sterile gowns, sterile gloves, sterile drape, hand hygiene and skin antiseptic. A timeout was performed prior to the initiation of the procedure. Patient was placed prone. Both flanks were prepped and draped  in sterile fashion. Ultrasound was used to identify the left kidney. Limited visualization of the left kidney due to overlying bowel gas. Only the left kidney upper pole could be well demonstrated with ultrasound due to the artifact from bowel and rib. Skin was anesthetized with 1% lidocaine. Using ultrasound guidance, an upper pole calyx was targeted. Needle was directed into the upper pole calyx with ultrasound guidance and contrast injection confirmed placement in the renal collecting system. A 0.018 wire was advanced into the collecting system and a transitional dilator set was placed. The tract was dilated to accommodate a 10 [place] multipurpose drain. Contrast injection confirmed placement in the renal pelvis. Catheter was sutured to skin and attached to a gravity bag. Attention was directed to the right kidney. Ultrasound was used to identify the right kidney. Calyx in the lower pole of the right kidney was targeted. Skin was anesthetized with 1% lidocaine. Using ultrasound guidance, needle was directed into the calyx and a 0.018 wire was advanced in the renal collecting system. Transitional dilator set was placed. Tract was dilated to accommodate a 10 [place] multipurpose drain. Drain placed in the renal pelvis. Contrast injection confirmed placement in the renal pelvis. Drain was flushed with saline and attached to a gravity bag. Catheter was sutured to skin and a dressing was placed. Fluoroscopic and ultrasound images were taken and saved for documentation. FINDINGS: Moderate hydronephrosis bilaterally. Left nephrostomy tube was placed in an upper pole calyx because the remainder of the kidney was obscured by bowel gas. Right nephrostomy tube was placed in  a lower pole calyx. IMPRESSION: Successful placement of bilateral nephrostomy tubes with ultrasound and fluoroscopic guidance. Electronically Signed   By: Markus Daft M.D.   On: 02/22/2020 18:21   IR IMAGING GUIDED PORT INSERTION  Result Date:  02/22/2020 INDICATION: 69 year old with metastatic bladder cancer. EXAM: FLUOROSCOPIC AND ULTRASOUND GUIDED PLACEMENT OF A SUBCUTANEOUS PORT COMPARISON:  None. MEDICATIONS: Ancef 2 g was given for the nephrostomy tube procedures which was performed immediately prior to the port placement. ANESTHESIA/SEDATION: Versed 1.0 mg IV; Fentanyl 50 mcg IV; Moderate Sedation Time:  23 minutes The patient was continuously monitored during the procedure by the interventional radiology nurse under my direct supervision. FLUOROSCOPY TIME:  12 seconds, 2 mGy COMPLICATIONS: None immediate. PROCEDURE: The procedure, risks, benefits, and alternatives were explained to the patient. Questions regarding the procedure were encouraged and answered. The patient understands and consents to the procedure. Patient was placed supine on the interventional table. Ultrasound confirmed a patent right internal jugular vein. Ultrasound image was saved for documentation. The right chest and neck were cleaned with a skin antiseptic and a sterile drape was placed. Maximal barrier sterile technique was utilized including caps, mask, sterile gowns, sterile gloves, sterile drape, hand hygiene and skin antiseptic. The right neck was anesthetized with 1% lidocaine. Small incision was made in the right neck with a blade. Micropuncture set was placed in the right internal jugular vein with ultrasound guidance. The micropuncture wire was used for measurement purposes. The right chest was anesthetized with 1% lidocaine with epinephrine. #15 blade was used to make an incision and a subcutaneous port pocket was formed. What Cheer was assembled. Subcutaneous tunnel was formed with a stiff tunneling device. The port catheter was brought through the subcutaneous tunnel. The port was placed in the subcutaneous pocket. The micropuncture set was exchanged for a peel-away sheath. The catheter was placed through the peel-away sheath and the tip was positioned at  the SVC and right atrium junction. Catheter placement was confirmed with fluoroscopy. The port was accessed and flushed with heparinized saline. The port pocket was closed using two layers of absorbable sutures and Dermabond. The vein skin site was closed using a single layer of absorbable suture and Dermabond. Sterile dressings were applied. Patient tolerated the procedure well without an immediate complication. Ultrasound and fluoroscopic images were taken and saved for this procedure. IMPRESSION: Placement of a subcutaneous port device. Catheter tip at the SVC and right atrium junction. Electronically Signed   By: Markus Daft M.D.   On: 02/22/2020 18:24    Discharge Exam: Vitals:   03/10/20 0247 03/10/20 0610  BP: 130/73 (!) 118/57  Pulse: 82 83  Resp: 19 15  Temp: 98.8 F (37.1 C) 98.4 F (36.9 C)  SpO2: 100% 99%   Vitals:   03/09/20 1400 03/09/20 2014 03/10/20 0247 03/10/20 0610  BP: (!) 115/57 120/61 130/73 (!) 118/57  Pulse: 64 75 82 83  Resp: 15 16 19 15   Temp: 98.2 F (36.8 C) 98.6 F (37 C) 98.8 F (37.1 C) 98.4 F (36.9 C)  TempSrc: Oral Oral Oral Oral  SpO2: 99% 100% 100% 99%  Weight:      Height:        General: Pt is alert, awake, not in acute distress Cardiovascular: RRR, S1/S2 +, no rubs, no gallops Respiratory: CTA bilaterally, no wheezing, no rhonchi Abdominal: Soft, NT, ND, bowel sounds + Extremities: no edema, no cyanosis Bilateral nephrostomy bags present with clear, yellow urine output noted.  The results of significant diagnostics from this hospitalization (including imaging, microbiology, ancillary and laboratory) are listed below for reference.     Microbiology: Recent Results (from the past 240 hour(s))  Urine culture     Status: Abnormal   Collection Time: 03/06/20  4:20 PM   Specimen: Urine, Catheterized  Result Value Ref Range Status   Specimen Description   Final    URINE, CATHETERIZED Performed at [facility], [address]  [place], [place] [address]    Special Requests   Final    RIGHT NEPHROSTOMY Performed at [facility], [address] [place], [place] [address]    Culture (A)  Final    >=100,000 COLONIES/mL KLEBSIELLA PNEUMONIAE >=100,000 COLONIES/mL PSEUDOMONAS PUTIDA >=100,000 COLONIES/mL ENTEROCOCCUS FAECALIS    Report Status 03/09/2020 FINAL  Final   Organism ID, Bacteria KLEBSIELLA PNEUMONIAE (A)  Final   Organism ID, Bacteria PSEUDOMONAS PUTIDA (A)  Final   Organism ID, Bacteria ENTEROCOCCUS FAECALIS (A)  Final      Susceptibility   Enterococcus faecalis - MIC*    AMPICILLIN <=2 SENSITIVE Sensitive     NITROFURANTOIN <=16 SENSITIVE Sensitive     VANCOMYCIN 2 SENSITIVE Sensitive     * >=100,000 COLONIES/mL ENTEROCOCCUS FAECALIS   Klebsiella pneumoniae - MIC*    AMPICILLIN >=32 RESISTANT Resistant     CEFAZOLIN <=4 SENSITIVE Sensitive     CEFTRIAXONE <=0.25 SENSITIVE Sensitive     CIPROFLOXACIN <=0.25 SENSITIVE Sensitive     GENTAMICIN <=1 SENSITIVE Sensitive     IMIPENEM <=0.25 SENSITIVE Sensitive     NITROFURANTOIN 32 SENSITIVE Sensitive     TRIMETH/SULFA <=20 SENSITIVE Sensitive     AMPICILLIN/SULBACTAM 4 SENSITIVE Sensitive     PIP/TAZO <=4 SENSITIVE Sensitive     * >=100,000 COLONIES/mL KLEBSIELLA PNEUMONIAE   Pseudomonas putida - MIC*    CEFTAZIDIME 2 SENSITIVE Sensitive     CIPROFLOXACIN <=0.25 SENSITIVE Sensitive     GENTAMICIN <=1 SENSITIVE Sensitive     IMIPENEM 1 SENSITIVE Sensitive     PIP/TAZO 8 SENSITIVE Sensitive     * >=100,000 COLONIES/mL PSEUDOMONAS PUTIDA  SARS Coronavirus 2 by RT PCR (hospital order, performed in [place] hospital lab) Nasopharyngeal Nasopharyngeal Swab     Status: None   Collection Time: 03/06/20  4:25 PM   Specimen: Nasopharyngeal Swab  Result Value Ref Range Status   SARS Coronavirus 2 NEGATIVE NEGATIVE Final    Comment: (NOTE) SARS-CoV-2 target nucleic acids are NOT DETECTED.  The SARS-CoV-2 RNA is generally detectable in upper and  lower respiratory specimens during the acute phase of infection. The lowest concentration of SARS-CoV-2 viral copies this assay can detect is 250 copies / mL. A negative result does not preclude SARS-CoV-2 infection and should not be used as the sole basis for treatment or other patient management decisions.  A negative result may occur with improper specimen collection / handling, submission of specimen other than nasopharyngeal swab, presence of viral mutation(s) within the areas targeted by this assay, and inadequate number of viral copies (<250 copies / mL). A negative result must be combined with clinical observations, patient history, and epidemiological information.  Fact Sheet for Patients:   StrictlyIdeas.no  Fact Sheet for Healthcare Providers: BankingDealers.co.za  This test is not yet approved or  cleared by the [place] FDA and has been authorized for detection and/or diagnosis of SARS-CoV-2 by FDA under an Emergency Use Authorization (EUA).  This EUA will remain in effect (meaning this test can be used) for the  duration of the COVID-19 declaration under Section 564(b)(1) of the Act, 21 U.S.C. section 360bbb-3(b)(1), unless the authorization is terminated or revoked sooner.  Performed at [facility], [address] [place], [place] [address]   Urine culture     Status: Abnormal   Collection Time: 03/06/20  4:28 PM   Specimen: Urine, Catheterized  Result Value Ref Range Status   Specimen Description   Final    URINE, CATHETERIZED Performed at [facility], [address] [place], [place] [address]    Special Requests   Final    LEFT NEPHROSTOMY TUBE Performed at [facility], [address] [place], [place] [address]    Culture (A)  Final    >=100,000 COLONIES/mL KLEBSIELLA PNEUMONIAE >=100,000 COLONIES/mL PSEUDOMONAS PUTIDA >=100,000 COLONIES/mL ENTEROCOCCUS FAECALIS    Report Status 03/09/2020 FINAL  Final    Organism ID, Bacteria KLEBSIELLA PNEUMONIAE (A)  Final   Organism ID, Bacteria PSEUDOMONAS PUTIDA (A)  Final   Organism ID, Bacteria ENTEROCOCCUS FAECALIS (A)  Final      Susceptibility   Enterococcus faecalis - MIC*    AMPICILLIN <=2 SENSITIVE Sensitive     NITROFURANTOIN <=16 SENSITIVE Sensitive     VANCOMYCIN 2 SENSITIVE Sensitive     * >=100,000 COLONIES/mL ENTEROCOCCUS FAECALIS   Klebsiella pneumoniae - MIC*    AMPICILLIN >=32 RESISTANT Resistant     CEFAZOLIN <=4 SENSITIVE Sensitive     CEFTRIAXONE <=0.25 SENSITIVE Sensitive     CIPROFLOXACIN <=0.25 SENSITIVE Sensitive     GENTAMICIN <=1 SENSITIVE Sensitive     IMIPENEM <=0.25 SENSITIVE Sensitive     NITROFURANTOIN 64 INTERMEDIATE Intermediate     TRIMETH/SULFA <=20 SENSITIVE Sensitive     AMPICILLIN/SULBACTAM 8 SENSITIVE Sensitive     PIP/TAZO <=4 SENSITIVE Sensitive     * >=100,000 COLONIES/mL KLEBSIELLA PNEUMONIAE   Pseudomonas putida - MIC*    CEFTAZIDIME <=1 SENSITIVE Sensitive     CIPROFLOXACIN <=0.25 SENSITIVE Sensitive     GENTAMICIN <=1 SENSITIVE Sensitive     IMIPENEM 1 SENSITIVE Sensitive     PIP/TAZO 8 SENSITIVE Sensitive     * >=100,000 COLONIES/mL PSEUDOMONAS PUTIDA  Culture, blood (routine x 2)     Status: None (Preliminary result)   Collection Time: 03/06/20  4:34 PM   Specimen: Left Antecubital; Blood  Result Value Ref Range Status   Specimen Description LEFT ANTECUBITAL  Final   Special Requests   Final    BOTTLES DRAWN AEROBIC AND ANAEROBIC Blood Culture adequate volume   Culture   Final    NO GROWTH 3 DAYS Performed at [facility], [address], [place], [place] [address]    Report Status PENDING  Incomplete  Culture, blood (routine x 2)     Status: None (Preliminary result)   Collection Time: 03/06/20  4:39 PM   Specimen: Left Antecubital; Blood  Result Value Ref Range Status   Specimen Description LEFT ANTECUBITAL  Final   Special Requests   Final    BOTTLES DRAWN AEROBIC AND ANAEROBIC  Blood Culture adequate volume   Culture   Final    NO GROWTH 3 DAYS Performed at [facility], [address] [place], [place] [address]    Report Status PENDING  Incomplete     Labs: BNP (last 3 results) Recent Labs    01/31/20 1149 02/02/20 0618 03/06/20 1633  BNP 1,406.0* 1,226.0* 1,194.1*   Basic Metabolic Panel: Recent Labs  Lab 03/04/20 7408 03/04/20 1448 03/06/20 1633 03/07/20 0518 03/08/20 1856 03/09/20 0537 03/10/20  0630  NA 128*   < > 128* 128* 126* 124* 125*  K 3.9   < > 4.7 4.6 4.1 4.3 4.2  CL 94*   < > 95* 97* 92* 87* 85*  CO2 24   < > 23 23 25 25 27   GLUCOSE 92   < > 89 99 100* 92 91  BUN 10   < > 23 24* 24* 21 23  CREATININE 0.82   < > 0.97 1.00 0.96 1.01 1.15  CALCIUM 8.1*   < > 7.7* 7.4* 8.0* 8.2* 8.7*  MG 1.8  --  1.9  --  1.7 1.8 1.7   < > = values in this interval not displayed.   Liver Function Tests: Recent Labs  Lab 03/04/20 0948  AST 16  ALT 13  ALKPHOS 169*  BILITOT 0.7  PROT 6.8  ALBUMIN 3.0*   No results for input(s): LIPASE, AMYLASE in the last 168 hours. No results for input(s): AMMONIA in the last 168 hours. CBC: Recent Labs  Lab 03/04/20 0948 03/04/20 0948 03/06/20 1633 03/07/20 0518 03/08/20 0624 03/09/20 0537 03/10/20 0623  WBC 10.4   < > 16.7* 12.6* 10.8* 8.7 4.1  NEUTROABS 8.0*  --  16.1*  --   --   --   --   HGB 9.2*   < > 8.7* 7.8* 8.1* 9.5* 9.3*  HCT 29.5*   < > 27.8* 25.1* 26.3* 29.8* 29.0*  MCV 73.2*   < > 72.2* 72.5* 72.9* 71.5* 71.6*  PLT 430*   < > 372 315 319 341 293   < > = values in this interval not displayed.   Cardiac Enzymes: No results for input(s): CKTOTAL, CKMB, CKMBINDEX, TROPONINI in the last 168 hours. BNP: Invalid input(s): POCBNP CBG: No results for input(s): GLUCAP in the last 168 hours. D-Dimer No results for input(s): DDIMER in the last 72 hours. Hgb A1c No results for input(s): HGBA1C in the last 72 hours. Lipid Profile No results for input(s): CHOL, HDL, LDLCALC, TRIG,  CHOLHDL, LDLDIRECT in the last 72 hours. Thyroid function studies No results for input(s): TSH, T4TOTAL, T3FREE, THYROIDAB in the last 72 hours.  Invalid input(s): FREET3 Anemia work up No results for input(s): VITAMINB12, FOLATE, FERRITIN, TIBC, IRON, RETICCTPCT in the last 72 hours. Urinalysis    Component Value Date/Time   COLORURINE YELLOW 03/06/2020 1628   APPEARANCEUR CLOUDY (A) 03/06/2020 1628   LABSPEC 1.014 03/06/2020 1628   PHURINE 6.0 03/06/2020 1628   GLUCOSEU NEGATIVE 03/06/2020 1628   GLUCOSEU 100 (A) 07/08/2010 1902   HGBUR LARGE (A) 03/06/2020 1628   HGBUR small 07/10/2010 1004   BILIRUBINUR NEGATIVE 03/06/2020 1628   BILIRUBINUR neg 02/21/2020 0920   KETONESUR NEGATIVE 03/06/2020 1628   PROTEINUR 100 (A) 03/06/2020 1628   UROBILINOGEN negative (A) 02/21/2020 0920   UROBILINOGEN 0.2 02/05/2011 1016   NITRITE POSITIVE (A) 03/06/2020 1628   LEUKOCYTESUR LARGE (A) 03/06/2020 1628   Sepsis Labs Invalid input(s): PROCALCITONIN,  WBC,  LACTICIDVEN Microbiology Recent Results (from the past 240 hour(s))  Urine culture     Status: Abnormal   Collection Time: 03/06/20  4:20 PM   Specimen: Urine, Catheterized  Result Value Ref Range Status   Specimen Description   Final    URINE, CATHETERIZED Performed at [facility], [address], [place], [place] [address]    Special Requests   Final    RIGHT NEPHROSTOMY Performed at [facility], [address] [place], [place] [address]  Culture (A)  Final    >=100,000 COLONIES/mL KLEBSIELLA PNEUMONIAE >=100,000 COLONIES/mL PSEUDOMONAS PUTIDA >=100,000 COLONIES/mL ENTEROCOCCUS FAECALIS    Report Status 03/09/2020 FINAL  Final   Organism ID, Bacteria KLEBSIELLA PNEUMONIAE (A)  Final   Organism ID, Bacteria PSEUDOMONAS PUTIDA (A)  Final   Organism ID, Bacteria ENTEROCOCCUS FAECALIS (A)  Final      Susceptibility   Enterococcus faecalis - MIC*    AMPICILLIN <=2 SENSITIVE Sensitive     NITROFURANTOIN <=16 SENSITIVE  Sensitive     VANCOMYCIN 2 SENSITIVE Sensitive     * >=100,000 COLONIES/mL ENTEROCOCCUS FAECALIS   Klebsiella pneumoniae - MIC*    AMPICILLIN >=32 RESISTANT Resistant     CEFAZOLIN <=4 SENSITIVE Sensitive     CEFTRIAXONE <=0.25 SENSITIVE Sensitive     CIPROFLOXACIN <=0.25 SENSITIVE Sensitive     GENTAMICIN <=1 SENSITIVE Sensitive     IMIPENEM <=0.25 SENSITIVE Sensitive     NITROFURANTOIN 32 SENSITIVE Sensitive     TRIMETH/SULFA <=20 SENSITIVE Sensitive     AMPICILLIN/SULBACTAM 4 SENSITIVE Sensitive     PIP/TAZO <=4 SENSITIVE Sensitive     * >=100,000 COLONIES/mL KLEBSIELLA PNEUMONIAE   Pseudomonas putida - MIC*    CEFTAZIDIME 2 SENSITIVE Sensitive     CIPROFLOXACIN <=0.25 SENSITIVE Sensitive     GENTAMICIN <=1 SENSITIVE Sensitive     IMIPENEM 1 SENSITIVE Sensitive     PIP/TAZO 8 SENSITIVE Sensitive     * >=100,000 COLONIES/mL PSEUDOMONAS PUTIDA  SARS Coronavirus 2 by RT PCR (hospital order, performed in [place] hospital lab) Nasopharyngeal Nasopharyngeal Swab     Status: None   Collection Time: 03/06/20  4:25 PM   Specimen: Nasopharyngeal Swab  Result Value Ref Range Status   SARS Coronavirus 2 NEGATIVE NEGATIVE Final    Comment: (NOTE) SARS-CoV-2 target nucleic acids are NOT DETECTED.  The SARS-CoV-2 RNA is generally detectable in upper and lower respiratory specimens during the acute phase of infection. The lowest concentration of SARS-CoV-2 viral copies this assay can detect is 250 copies / mL. A negative result does not preclude SARS-CoV-2 infection and should not be used as the sole basis for treatment or other patient management decisions.  A negative result may occur with improper specimen collection / handling, submission of specimen other than nasopharyngeal swab, presence of viral mutation(s) within the areas targeted by this assay, and inadequate number of viral copies (<250 copies / mL). A negative result must be combined with clinical observations, patient  history, and epidemiological information.  Fact Sheet for Patients:   StrictlyIdeas.no  Fact Sheet for Healthcare Providers: BankingDealers.co.za  This test is not yet approved or  cleared by the [place] FDA and has been authorized for detection and/or diagnosis of SARS-CoV-2 by FDA under an Emergency Use Authorization (EUA).  This EUA will remain in effect (meaning this test can be used) for the duration of the COVID-19 declaration under Section 564(b)(1) of the Act, 21 U.S.C. section 360bbb-3(b)(1), unless the authorization is terminated or revoked sooner.  Performed at [facility], [address] [place], [place] [address]   Urine culture     Status: Abnormal   Collection Time: 03/06/20  4:28 PM   Specimen: Urine, Catheterized  Result Value Ref Range Status   Specimen Description   Final    URINE, CATHETERIZED Performed at [facility], [address], [place],  [address]    Special Requests   Final    LEFT NEPHROSTOMY TUBE Performed at [facility], [address]  [place], [place] [address]    Culture (A)  Final    >=100,000 COLONIES/mL KLEBSIELLA PNEUMONIAE >=100,000 COLONIES/mL PSEUDOMONAS PUTIDA >=100,000 COLONIES/mL ENTEROCOCCUS FAECALIS    Report Status 03/09/2020 FINAL  Final   Organism ID, Bacteria KLEBSIELLA PNEUMONIAE (A)  Final   Organism ID, Bacteria PSEUDOMONAS PUTIDA (A)  Final   Organism ID, Bacteria ENTEROCOCCUS FAECALIS (A)  Final      Susceptibility   Enterococcus faecalis - MIC*    AMPICILLIN <=2 SENSITIVE Sensitive     NITROFURANTOIN <=16 SENSITIVE Sensitive     VANCOMYCIN 2 SENSITIVE Sensitive     * >=100,000 COLONIES/mL ENTEROCOCCUS FAECALIS   Klebsiella pneumoniae - MIC*    AMPICILLIN >=32 RESISTANT Resistant     CEFAZOLIN <=4 SENSITIVE Sensitive     CEFTRIAXONE <=0.25 SENSITIVE Sensitive     CIPROFLOXACIN <=0.25 SENSITIVE Sensitive     GENTAMICIN <=1 SENSITIVE Sensitive      IMIPENEM <=0.25 SENSITIVE Sensitive     NITROFURANTOIN 64 INTERMEDIATE Intermediate     TRIMETH/SULFA <=20 SENSITIVE Sensitive     AMPICILLIN/SULBACTAM 8 SENSITIVE Sensitive     PIP/TAZO <=4 SENSITIVE Sensitive     * >=100,000 COLONIES/mL KLEBSIELLA PNEUMONIAE   Pseudomonas putida - MIC*    CEFTAZIDIME <=1 SENSITIVE Sensitive     CIPROFLOXACIN <=0.25 SENSITIVE Sensitive     GENTAMICIN <=1 SENSITIVE Sensitive     IMIPENEM 1 SENSITIVE Sensitive     PIP/TAZO 8 SENSITIVE Sensitive     * >=100,000 COLONIES/mL PSEUDOMONAS PUTIDA  Culture, blood (routine x 2)     Status: None (Preliminary result)   Collection Time: 03/06/20  4:34 PM   Specimen: Left Antecubital; Blood  Result Value Ref Range Status   Specimen Description LEFT ANTECUBITAL  Final   Special Requests   Final    BOTTLES DRAWN AEROBIC AND ANAEROBIC Blood Culture adequate volume   Culture   Final    NO GROWTH 3 DAYS Performed at [facility], [address], [place], [place] [address]    Report Status PENDING  Incomplete  Culture, blood (routine x 2)     Status: None (Preliminary result)   Collection Time: 03/06/20  4:39 PM   Specimen: Left Antecubital; Blood  Result Value Ref Range Status   Specimen Description LEFT ANTECUBITAL  Final   Special Requests   Final    BOTTLES DRAWN AEROBIC AND ANAEROBIC Blood Culture adequate volume   Culture   Final    NO GROWTH 3 DAYS Performed at [facility], [address] [place], [place] [address]    Report Status PENDING  Incomplete     Time coordinating discharge: 35 minutes  SIGNED:   Rodena Goldmann, DO [facility] 03/10/2020, 10:02 AM  If 7PM-7AM, please contact night-coverage [email or web address]

## 2020-03-10 NOTE — Progress Notes (Signed)
MD notified of telemetry reported 7 beat run of vtach and pt reported excessive sweating. Vitals wnl (see flowsheet). Will reassess

## 2020-03-10 NOTE — Progress Notes (Signed)
MD verbal order for magnesium and potassium lab draw

## 2020-03-11 ENCOUNTER — Inpatient Hospital Stay (HOSPITAL_BASED_OUTPATIENT_CLINIC_OR_DEPARTMENT_OTHER): Payer: Medicare Other | Admitting: Nurse Practitioner

## 2020-03-11 ENCOUNTER — Inpatient Hospital Stay (HOSPITAL_COMMUNITY): Payer: Medicare Other

## 2020-03-11 ENCOUNTER — Telehealth (HOSPITAL_COMMUNITY): Payer: Self-pay | Admitting: Hematology

## 2020-03-11 ENCOUNTER — Telehealth: Payer: Self-pay | Admitting: *Deleted

## 2020-03-11 ENCOUNTER — Other Ambulatory Visit: Payer: Self-pay

## 2020-03-11 DIAGNOSIS — C7910 Secondary malignant neoplasm of unspecified urinary organs: Secondary | ICD-10-CM

## 2020-03-11 DIAGNOSIS — Z79899 Other long term (current) drug therapy: Secondary | ICD-10-CM | POA: Diagnosis not present

## 2020-03-11 DIAGNOSIS — R600 Localized edema: Secondary | ICD-10-CM | POA: Diagnosis not present

## 2020-03-11 DIAGNOSIS — I739 Peripheral vascular disease, unspecified: Secondary | ICD-10-CM | POA: Diagnosis not present

## 2020-03-11 DIAGNOSIS — Z452 Encounter for adjustment and management of vascular access device: Secondary | ICD-10-CM | POA: Diagnosis not present

## 2020-03-11 DIAGNOSIS — I2510 Atherosclerotic heart disease of native coronary artery without angina pectoris: Secondary | ICD-10-CM | POA: Diagnosis not present

## 2020-03-11 DIAGNOSIS — Z7982 Long term (current) use of aspirin: Secondary | ICD-10-CM | POA: Diagnosis not present

## 2020-03-11 DIAGNOSIS — Z9221 Personal history of antineoplastic chemotherapy: Secondary | ICD-10-CM | POA: Diagnosis not present

## 2020-03-11 DIAGNOSIS — C679 Malignant neoplasm of bladder, unspecified: Secondary | ICD-10-CM | POA: Diagnosis not present

## 2020-03-11 DIAGNOSIS — N136 Pyonephrosis: Secondary | ICD-10-CM | POA: Diagnosis not present

## 2020-03-11 DIAGNOSIS — Z95828 Presence of other vascular implants and grafts: Secondary | ICD-10-CM

## 2020-03-11 DIAGNOSIS — M1990 Unspecified osteoarthritis, unspecified site: Secondary | ICD-10-CM | POA: Diagnosis not present

## 2020-03-11 DIAGNOSIS — Z87891 Personal history of nicotine dependence: Secondary | ICD-10-CM | POA: Diagnosis not present

## 2020-03-11 DIAGNOSIS — C7951 Secondary malignant neoplasm of bone: Secondary | ICD-10-CM | POA: Diagnosis not present

## 2020-03-11 DIAGNOSIS — D509 Iron deficiency anemia, unspecified: Secondary | ICD-10-CM | POA: Diagnosis not present

## 2020-03-11 DIAGNOSIS — C670 Malignant neoplasm of trigone of bladder: Secondary | ICD-10-CM

## 2020-03-11 DIAGNOSIS — Z86718 Personal history of other venous thrombosis and embolism: Secondary | ICD-10-CM | POA: Diagnosis not present

## 2020-03-11 DIAGNOSIS — R590 Localized enlarged lymph nodes: Secondary | ICD-10-CM | POA: Diagnosis not present

## 2020-03-11 DIAGNOSIS — I10 Essential (primary) hypertension: Secondary | ICD-10-CM | POA: Diagnosis not present

## 2020-03-11 DIAGNOSIS — Z936 Other artificial openings of urinary tract status: Secondary | ICD-10-CM | POA: Diagnosis not present

## 2020-03-11 LAB — COMPREHENSIVE METABOLIC PANEL
ALT: 52 U/L — ABNORMAL HIGH (ref 0–44)
AST: 73 U/L — ABNORMAL HIGH (ref 15–41)
Albumin: 3.5 g/dL (ref 3.5–5.0)
Alkaline Phosphatase: 165 U/L — ABNORMAL HIGH (ref 38–126)
Anion gap: 10 (ref 5–15)
BUN: 20 mg/dL (ref 8–23)
CO2: 26 mmol/L (ref 22–32)
Calcium: 8.4 mg/dL — ABNORMAL LOW (ref 8.9–10.3)
Chloride: 88 mmol/L — ABNORMAL LOW (ref 98–111)
Creatinine, Ser: 1.06 mg/dL (ref 0.61–1.24)
GFR calc Af Amer: >60 mL/min (ref >60)
GFR calc non Af Amer: >60 mL/min (ref >60)
Glucose, Bld: 135 mg/dL — ABNORMAL HIGH (ref 70–99)
Potassium: 3.9 mmol/L (ref 3.5–5.1)
Sodium: 124 mmol/L — ABNORMAL LOW (ref 135–145)
Total Bilirubin: 0.6 mg/dL (ref 0.3–1.2)
Total Protein: 7.5 g/dL (ref 6.5–8.1)

## 2020-03-11 LAB — CBC WITH DIFFERENTIAL/PLATELET
Abs Immature Granulocytes: 0.05 10*3/uL (ref 0.00–0.07)
Basophils Absolute: 0.1 10*3/uL (ref 0.0–0.1)
Basophils Relative: 1 %
Eosinophils Absolute: 0.1 10*3/uL (ref 0.0–0.5)
Eosinophils Relative: 2 %
HCT: 31.6 % — ABNORMAL LOW (ref 39.0–52.0)
Hemoglobin: 10 g/dL — ABNORMAL LOW (ref 13.0–17.0)
Immature Granulocytes: 1 %
Lymphocytes Relative: 17 %
Lymphs Abs: 0.8 10*3/uL (ref 0.7–4.0)
MCH: 23.1 pg — ABNORMAL LOW (ref 26.0–34.0)
MCHC: 31.6 g/dL (ref 30.0–36.0)
MCV: 73 fL — ABNORMAL LOW (ref 80.0–100.0)
Monocytes Absolute: 0.2 10*3/uL (ref 0.1–1.0)
Monocytes Relative: 4 %
Neutro Abs: 3.5 10*3/uL (ref 1.7–7.7)
Neutrophils Relative %: 75 %
Platelets: 251 10*3/uL (ref 150–400)
RBC: 4.33 MIL/uL (ref 4.22–5.81)
RDW: 21.8 % — ABNORMAL HIGH (ref 11.5–15.5)
WBC: 4.7 10*3/uL (ref 4.0–10.5)
nRBC: 0 % (ref 0.0–0.2)

## 2020-03-11 LAB — CULTURE, BLOOD (ROUTINE X 2)
Culture: NO GROWTH
Culture: NO GROWTH
Special Requests: ADEQUATE
Special Requests: ADEQUATE

## 2020-03-11 LAB — MAGNESIUM: Magnesium: 1.8 mg/dL (ref 1.7–2.4)

## 2020-03-11 MED ORDER — SODIUM CHLORIDE 0.9% FLUSH
10.0000 mL | Freq: Once | INTRAVENOUS | Status: AC
  Administered 2020-03-11: 10 mL via INTRAVENOUS

## 2020-03-11 MED ORDER — HEPARIN SOD (PORK) LOCK FLUSH 100 UNIT/ML IV SOLN
500.0000 [IU] | Freq: Once | INTRAVENOUS | Status: AC
  Administered 2020-03-11: 500 [IU] via INTRAVENOUS

## 2020-03-11 NOTE — Assessment & Plan Note (Addendum)
1.  Metastatic urothelial carcinoma: -Left subclavicular lymph node biopsy on 11/21/2019 showed metastatic urothelial carcinoma. -PET scan on 10/30/2019 showed right bladder mass with right hydroureteronephrosis.  Pelvic, retroperitoneal and left subclavicular adenopathy. -CT CAP on 02/02/2020 shows progression of bladder wall thickening, increased retroperitoneal and pelvic adenopathy.  Interval development of multiple faint foci of sclerosis involving ribs, spine, pelvic bones consistent with bone metastasis.  2.7 cm exophytic right kidney mass but PET negative.  Small amount of ascites. -PD-L1 CPS 1%. -Plan is to proceed with carboplatin and gemcitabine day 1 day 8 and every 21 days. -Labs done on 03/11/2020 hemoglobin 10.0, platelets 251, WBC 4.7, AST 73 and ALT 52. -He is actively being treated with antibiotics for his UTI. He was discharged from the hospital last week.  -He will proceed with cycle two on 03/28/2020.  2.  Right dorsum and leg swelling: -Right foot dorsum is more swollen along with scrotal swelling. -Doppler on 02/13/2020 was negative.  3.  Bone metastasis: -Recent CT showed multiple foci of sclerosis involving ribs, spine, pelvic bones consistent with metastatic disease. -Xgeva with cycle 2.  4.  Hydronephrosis: -Had bilateral percutaneous nephrostomy tubes on 02/22/2020. -Labs today showed creatinine 1.06  5.  Microcytic anemia: -Last Feraheme infusion on 02/01/2020 -Labs done on 03/11/2020 showed hemoglobin 10.0. 

## 2020-03-11 NOTE — Telephone Encounter (Signed)
Transition Care Management Follow-up Telephone Call   Date discharged?  03-10-20              How have you been since you were released from the hospital? just hasnt had chemo as his liver wasn't regulated so they told him they would wait two weeks to restart chemo   Do you understand why you were in the hospital? Yes he had kidney infection     Do you understand the discharge instructions? yes   Where were you discharged to? [facility]    Items Reviewed:  Medications reviewed: yes  Allergies reviewed: yes  Dietary changes reviewed: yes  Referrals reviewed: yes   Functional Questionnaire:   Activities of Daily Living (ADLs):  pt can cook and clean and dress for himself everything good     Any transportation issues/concerns?: no    Any patient concerns?  no   Confirmed importance and date/time of follow-up visits scheduled yes     Confirmed with patient if condition begins to worsen call PCP or go to the ER.  Patient was given the office number and encouraged to call back with question or concerns.  : yes

## 2020-03-11 NOTE — Telephone Encounter (Signed)
Enrolled pt into the [employer]. Efft 03/11/20-03/11/21 One time grant of $1000. Gave the pt $35 in gift cards for food and gas.

## 2020-03-11 NOTE — Telephone Encounter (Signed)
Orders placed for nephrostomy tube exchange. Will call IR to schedule at [facility]

## 2020-03-11 NOTE — Telephone Encounter (Signed)
Yes please have the tubes scheduled to be change around July 3rd

## 2020-03-11 NOTE — Progress Notes (Signed)
No treatment today verbal order Francene Finders, NP.

## 2020-03-11 NOTE — Progress Notes (Signed)
[facility], [place] [address]   CLINIC:  Medical Oncology/Hematology  PCP:  [name], MD [address], [address] [place] [address] [phone]   REASON FOR VISIT: Follow-up for metastatic urothelial carcinoma   CURRENT THERAPY: carboplatin and gemcitabine day 1 day 8 every 21 days  BRIEF ONCOLOGIC HISTORY:  Oncology History  Metastatic urothelial carcinoma ([place])  02/13/2020 Initial Diagnosis   Metastatic urothelial carcinoma ([place])   03/04/2020 -  Chemotherapy   The patient had palonosetron (ALOXI) injection 0.25 mg, 0.25 mg, Intravenous,  Once, 1 of 4 cycles Administration: 0.25 mg (03/04/2020) CARBOplatin (PARAPLATIN) 400 mg in sodium chloride 0.9 % 250 mL chemo infusion, 400 mg (100 % of original dose 397 mg), Intravenous,  Once, 1 of 4 cycles Dose modification:   (original dose 397 mg, Cycle 1) gemcitabine (GEMZAR) 1,558 mg in sodium chloride 0.9 % 250 mL chemo infusion, 1,000 mg/m2 = 1,558 mg, Intravenous,  Once, 1 of 4 cycles Administration: 1,558 mg (03/04/2020) fosaprepitant (EMEND) 150 mg in sodium chloride 0.9 % 145 mL IVPB, 150 mg, Intravenous,  Once, 1 of 4 cycles Administration: 150 mg (03/04/2020)  for chemotherapy treatment.       INTERVAL HISTORY:  [name] 69 y.o. male returns for routine follow-up for metastatic urothelial carcinoma. He was just hospitalized for a UTI after his first day of treatment. He was released however is still on antibiotics. Denies any nausea, vomiting, or diarrhea. Denies any new pains. Had not noticed any recent bleeding such as epistaxis, hematuria or hematochezia. Denies recent chest pain on exertion, shortness of breath on minimal exertion, pre-syncopal episodes, or palpitations. Denies any numbness or tingling in hands or feet. Denies any recent fevers, infections, or recent hospitalizations. Patient reports appetite at 100% and energy level at 75%.     REVIEW OF SYSTEMS:    Review of Systems  All other systems reviewed and are negative.    PAST MEDICAL/SURGICAL HISTORY:  Past Medical History:  Diagnosis Date  . Arterial embolus of lower extremity ([place]) 11/23/2013  . Arthritis   . CAD (coronary artery disease)    a. s/p PCI with 3.5 x 18 BMS to mid RCA, non obst LAD, Lcx patent 08/10/13  . Cancer ([place]) 2021   bladder  . Depression, major, single episode, mild ([place]) 12/20/2019  . Embolus of femoral artery [facility]) 2012   Surgical repair, previously on Coumadin  . Essential hypertension   . ETOH abuse   . History of DVT (deep vein thrombosis) 2011  . History of GI bleed    February 2012   . Ingrown left greater toenail 09/04/2018  . 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 Mycotic aneurysm ([place])    Associated with GI bleed and hepatic artery aneurysm status post embolization February 2012  . Port-A-Cath in place 02/23/2020  . Prosthetic valve endocarditis ([place])   . PVD (peripheral vascular disease) ([place])   . S/P aortic valve replacement    Details not clear -  reportedly 1971 per patient   Past Surgical History:  Procedure Laterality Date  . ABDOMINAL AORTOGRAM W/LOWER EXTREMITY N/A 10/31/2018   Procedure: ABDOMINAL AORTOGRAM W/LOWER EXTREMITY;  Surgeon: Lorretta Harp, MD;  Location: [place] [facility];  Service: Cardiovascular;  Laterality: N/A;  . Aoric valve replacement  January 2005   72m Medtronic Freestyle stentless porcine bioprosthesis ([facility]  . CARDIAC CATHETERIZATION     30-40% mid LAD, 20-30% ostial  D1, proximal 30-40% RCA, 95% mid RCA thrombus, 40% distal stenosis. EF 45%, inferior HK.  [name] COLONOSCOPY N/A 04/20/2014   Procedure: COLONOSCOPY;  Surgeon: Rogene Houston, MD;  [facility];  Service: Endoscopy;  Laterality: N/A;  930  . COLONOSCOPY WITH PROPOFOL N/A 02/02/2020   Procedure: COLONOSCOPY WITH PROPOFOL;  Surgeon: Rogene Houston, MD;  [facility];  Service: Endoscopy;  Laterality: N/A;  . CORONARY ANGIOPLASTY  WITH STENT PLACEMENT  07/2013  . ESOPHAGOGASTRODUODENOSCOPY (EGD) WITH PROPOFOL N/A 02/02/2020   Procedure: ESOPHAGOGASTRODUODENOSCOPY (EGD) WITH PROPOFOL;  Surgeon: Rogene Houston, MD;  [facility];  Service: Endoscopy;  Laterality: N/A;  . INGUINAL HERNIA REPAIR Bilateral 04/17/2019   Procedure: ATTEMPTED LAPAROSCOPIC BILATERAL INGUINAL HERNIA REPAIR;  Surgeon: Coralie Keens, MD;  Location: [facility];  Service: General;  Laterality: Bilateral;  . INGUINAL HERNIA REPAIR Bilateral 04/17/2019   Procedure: Hernia Repair Inguinal Adult;  Surgeon: Coralie Keens, MD;  Location: [facility];  Service: General;  Laterality: Bilateral;  . IR IMAGING GUIDED PORT INSERTION  02/22/2020  . IR NEPHROSTOMY PLACEMENT LEFT  02/22/2020  . IR NEPHROSTOMY PLACEMENT RIGHT  02/22/2020  . LEFT HEART CATHETERIZATION WITH CORONARY ANGIOGRAM N/A 08/10/2013   Procedure: LEFT HEART CATHETERIZATION WITH CORONARY ANGIOGRAM;  Surgeon: Blane Ohara, MD;  Location: [facility] C[facility] LAB;  Service: Cardiovascular;  Laterality: N/A;  . Left leg embolectomy  May 2012   Dr. Trula Slade - reason for lifelong coumadin  . LYMPH NODE BIOPSY Left 11/21/2019   Procedure: Left Neck LYMPH NODE BIOPSY;  Surgeon: Rozetta Nunnery, MD;  Location: [place];  Service: ENT;  Laterality: Left;  . PERIPHERAL VASCULAR ATHERECTOMY  10/31/2018   Procedure: PERIPHERAL VASCULAR ATHERECTOMY;  Surgeon: Lorretta Harp, MD;  Location: [place] [facility];  Service: Cardiovascular;;  left SFA  . POLYPECTOMY  02/02/2020   Procedure: POLYPECTOMY;  Surgeon: Rogene Houston, MD;  [facility];  Service: Endoscopy;;  splenic flexure     SOCIAL HISTORY:  Social History   Socioeconomic History  . Marital status: Single    Spouse name: Not on file  . Number of children: 2  . Years of education: Not on file  . Highest education level: 11th grade  Occupational History  . Occupation: disability  Tobacco Use  . Smoking status: Former Smoker     Packs/day: 0.50    Years: 3.00    Pack years: 1.50    Types: Cigarettes    Start date: 10/12/1965    Quit date: 02/19/1970    Years since quitting: 50.0  . Smokeless tobacco: Never Used  Vaping Use  . Vaping Use: Never used  Substance and Sexual Activity  . Alcohol use: Not Currently  . Drug use: Yes    Types: Marijuana    Comment: pt smokes weed once in the morning and once in the evening  . Sexual activity: Not Currently  Other Topics Concern  . Not on file  Social History Narrative  . Not on file   Social Determinants of Health   Financial Resource Strain:   . Difficulty of Paying Living Expenses:   Food Insecurity:   . Worried About Charity fundraiser in the Last Year:   . Arboriculturist in the Last Year:   Transportation Needs:   . Film/video editor (Medical):   [name] Lack of Transportation (Non-Medical):   Physical Activity:   . Days of Exercise per Week:   .  Minutes of Exercise per Session:   Stress:   . Feeling of Stress :   Social Connections:   . Frequency of Communication with Friends and Family:   . Frequency of Social Gatherings with Friends and Family:   . Attends Religious Services:   . Active Member of Clubs or Organizations:   . Attends Archivist Meetings:   [name] Marital Status:   Intimate Partner Violence:   . Fear of Current or Ex-Partner:   . Emotionally Abused:   [name] Physically Abused:   . Sexually Abused:     FAMILY HISTORY:  Family History  Problem Relation Age of Onset  . Diabetes Mother   . Cirrhosis Father   . COPD Sister     CURRENT MEDICATIONS:  Outpatient Encounter Medications as of 03/11/2020  Medication Sig  . amoxicillin-clavulanate (AUGMENTIN) 875-125 MG tablet Take 1 tablet by mouth every 12 (twelve) hours for 10 days.  [name] aspirin EC 81 MG tablet Take 1 tablet (81 mg total) by mouth daily.  [name] atorvastatin (LIPITOR) 40 MG tablet Take 1 tablet by mouth once daily (Patient taking differently: Take 40 mg by mouth every  evening. )  . busPIRone (BUSPAR) 10 MG tablet Take 1 tablet (10 mg total) by mouth 2 (two) times daily.  [name] CARBOPLATIN IV Inject into the vein. Days 1, 8 every 21 days  . cholecalciferol (VITAMIN D) 1000 units tablet Take 1 tablet (1,000 Units total) by mouth daily.  . ciprofloxacin (CIPRO) 500 MG tablet Take 1 tablet (500 mg total) by mouth 2 (two) times daily for 10 days.  . clopidogrel (PLAVIX) 75 MG tablet Take 1 tablet by mouth once daily (Patient taking differently: Take 75 mg by mouth daily. )  . escitalopram (LEXAPRO) 10 MG tablet Take 1 tablet (10 mg total) by mouth daily.  . fluticasone (FLONASE) 50 MCG/ACT nasal spray Place 2 sprays into both nostrils daily.  . furosemide (LASIX) 40 MG tablet Take 1 tablet (40 mg total) by mouth 2 (two) times daily. Take 52m in AM and 242min PM.  . GEMCITABINE HCL IV Inject 1,000 mg/m2 into the vein. Days 1, 8 every 21 days  . metoprolol succinate (TOPROL-XL) 50 MG 24 hr tablet TAKE 1 TABLET BY MOUTH ONCE DAILY WITH  OR  IMMEDIATELY  FOLLOWING  A  MEAL (Patient taking differently: Take 50 mg by mouth daily. )  . montelukast (SINGULAIR) 10 MG tablet TAKE 1 TABLET BY MOUTH AT BEDTIME (Patient taking differently: Take 10 mg by mouth at bedtime. )  . pantoprazole (PROTONIX) 40 MG tablet Take 1 tablet (40 mg total) by mouth daily before breakfast.  . HYDROcodone-acetaminophen (NORCO/VICODIN) 5-325 MG tablet Take 1 tablet by mouth every 6 (six) hours as needed for moderate pain. (Patient not taking: Reported on 03/11/2020)  . lidocaine-prilocaine (EMLA) cream Apply a small amount to port a cath site and cover with plastic wrap 1 hour prior to chemotherapy appointments (Patient not taking: Reported on 03/11/2020)  . nitroGLYCERIN (NITROSTAT) 0.4 MG SL tablet Place 1 tablet (0.4 mg total) under the tongue every 5 (five) minutes as needed for chest pain. (Patient not taking: Reported on 03/11/2020)  . prochlorperazine (COMPAZINE) 10 MG tablet Take 1 tablet (10 mg  total) by mouth every 6 (six) hours as needed (Nausea or vomiting). (Patient not taking: Reported on 03/11/2020)  . trolamine salicylate (ASPERCREME) 10 % cream Apply 1 application topically daily as needed for muscle pain (for pain in feet).  (Patient not  taking: Reported on 03/11/2020)   No facility-administered encounter medications on file as of 03/11/2020.    ALLERGIES:  No Known Allergies   PHYSICAL EXAM:  ECOG Performance status: 1  Vitals:   03/11/20 0810  BP: (!) 140/58  Pulse: 67  Resp: 18  Temp: 98.2 F (36.8 C)  SpO2: 100%   Filed Weights   03/11/20 0810  Weight: 112 lb 12.8 oz (51.2 kg)   Physical Exam Constitutional:      Appearance: Normal appearance. He is normal weight.  Cardiovascular:     Rate and Rhythm: Normal rate and regular rhythm.     Heart sounds: Normal heart sounds.  Pulmonary:     Effort: Pulmonary effort is normal.     Breath sounds: Normal breath sounds.  Abdominal:     General: Bowel sounds are normal.     Palpations: Abdomen is soft.  Musculoskeletal:        General: Normal range of motion.  Skin:    General: Skin is warm.  Neurological:     Mental Status: He is alert and oriented to person, place, and time. Mental status is at baseline.  Psychiatric:        Mood and Affect: Mood normal.        Behavior: Behavior normal.        Thought Content: Thought content normal.        Judgment: Judgment normal.      LABORATORY DATA:  I have reviewed the labs as listed.  CBC    Component Value Date/Time   WBC 4.7 03/11/2020 0809   RBC 4.33 03/11/2020 0809   HGB 10.0 (L) 03/11/2020 0809   HGB 14.4 10/26/2018 1139   HCT 31.6 (L) 03/11/2020 0809   HCT 43.2 10/26/2018 1139   PLT 251 03/11/2020 0809   PLT 187 10/26/2018 1139   MCV 73.0 (L) 03/11/2020 0809   MCV 89 10/26/2018 1139   MCH 23.1 (L) 03/11/2020 0809   MCHC 31.6 03/11/2020 0809   RDW 21.8 (H) 03/11/2020 0809   RDW 13.3 10/26/2018 1139   LYMPHSABS 0.8 03/11/2020 0809    MONOABS 0.2 03/11/2020 0809   EOSABS 0.1 03/11/2020 0809   BASOSABS 0.1 03/11/2020 0809   CMP Latest Ref Rng & Units 03/11/2020 03/10/2020 03/09/2020  Glucose 70 - 99 mg/dL 135(H) 91 92  BUN 8 - 23 mg/dL _0 Creatinine 0.61 - 1.24 mg/dL 1.06 1.15 1.01  Sodium 135 - 145 mmol/L 124(L) 125(L) 124(L)  Potassium 3.5 - 5.1 mmol/L 3.9 4.2 4.3  Chloride 98 - 111 mmol/L 88(L) 85(L) 87(L)  CO2 22 - 32 mmol/L _1 Calcium 8.9 - 10.3 mg/dL 8.4(L) 8.7(L) 8.2(L)  Total Protein 6.5 - 8.1 g/dL 7.5 - -  Total Bilirubin 0.3 - 1.2 mg/dL 0.6 - -  Alkaline Phos 38 - 126 U/L 165(H) - -  AST 15 - 41 U/L 73(H) - -  ALT 0 - 44 U/L 52(H) - -     ASSESSMENT & PLAN:  Metastatic urothelial carcinoma (HCC) 1.  Metastatic urothelial carcinoma: -Left subclavicular lymph node biopsy on 11/21/2019 showed metastatic urothelial carcinoma. -PET scan on 10/30/2019 showed right bladder mass with right hydroureteronephrosis.  Pelvic, retroperitoneal and left subclavicular adenopathy. -CT CAP on 02/02/2020 shows progression of bladder wall thickening, increased retroperitoneal and pelvic adenopathy.  Interval development of multiple faint foci of sclerosis involving ribs, spine, pelvic bones consistent with bone metastasis.  2.7 cm exophytic right kidney mass but PET  negative.  Small amount of ascites. -PD-L1 CPS 1%. -Plan is to proceed with carboplatin and gemcitabine day 1 day 8 and every 21 days. -Labs done on 03/11/2020 hemoglobin 10.0, platelets 251, WBC 4.7, AST 73 and ALT 52. -He is actively being treated with antibiotics for his UTI. He was discharged from the hospital last week.  -He will proceed with cycle two on 03/28/2020.  2.  Right dorsum and leg swelling: -Right foot dorsum is more swollen along with scrotal swelling. -Doppler on 02/13/2020 was negative.  3.  Bone metastasis: -Recent CT showed multiple foci of sclerosis involving ribs, spine, pelvic bones consistent with metastatic disease. -Xgeva with  cycle 2.  4.  Hydronephrosis: -Had bilateral percutaneous nephrostomy tubes on 02/22/2020. -Labs today showed creatinine 1.06  5.  Microcytic anemia: -Last Feraheme infusion on 02/01/2020 -Labs done on 03/11/2020 showed hemoglobin 10.0.     Orders placed this encounter:  No orders of the defined types were placed in this encounter.     Francene Finders, [place] [phone]

## 2020-03-13 NOTE — Telephone Encounter (Signed)
IR scheduled pt for July 16th to have bil nephrostomy tube exchange

## 2020-03-19 ENCOUNTER — Encounter: Payer: Self-pay | Admitting: Family Medicine

## 2020-03-19 ENCOUNTER — Ambulatory Visit (INDEPENDENT_AMBULATORY_CARE_PROVIDER_SITE_OTHER): Payer: Medicare Other | Admitting: Family Medicine

## 2020-03-19 ENCOUNTER — Other Ambulatory Visit: Payer: Self-pay

## 2020-03-19 VITALS — BP 117/53 | HR 60 | Temp 97.4°F | Resp 16 | Ht 64.0 in | Wt 118.1 lb

## 2020-03-19 DIAGNOSIS — R319 Hematuria, unspecified: Secondary | ICD-10-CM

## 2020-03-19 DIAGNOSIS — Z7689 Persons encountering health services in other specified circumstances: Secondary | ICD-10-CM | POA: Insufficient documentation

## 2020-03-19 DIAGNOSIS — C7910 Secondary malignant neoplasm of unspecified urinary organs: Secondary | ICD-10-CM | POA: Diagnosis not present

## 2020-03-19 DIAGNOSIS — I10 Essential (primary) hypertension: Secondary | ICD-10-CM | POA: Diagnosis not present

## 2020-03-19 DIAGNOSIS — R450 Nervousness: Secondary | ICD-10-CM | POA: Diagnosis not present

## 2020-03-19 DIAGNOSIS — N390 Urinary tract infection, site not specified: Secondary | ICD-10-CM

## 2020-03-19 NOTE — Assessment & Plan Note (Signed)
Is followed by Dr. Raliegh Ip  Please review epic notes for detail.

## 2020-03-19 NOTE — Assessment & Plan Note (Addendum)
Reviewed hospitalization notes and labs and orders.  Updated labs ordered today.  Notified cardiology of need for appointment post hospitalization.  Encouraged him to continue all medications as directed.  Encouraged him to make sure he makes all appointments.  If he has any issues or concerns he is encouraged to contact us.

## 2020-03-19 NOTE — Assessment & Plan Note (Signed)
Resolving.  Almost finished with his oral antibiotics from being in the hospital.  Denies having any signs of symptoms today in the office.

## 2020-03-19 NOTE — Assessment & Plan Note (Signed)
Stable, continue all medications as prescribed, follow up with cardiology soon.

## 2020-03-19 NOTE — Patient Instructions (Signed)
I appreciate the opportunity to provide you with care for your health and wellness. Today we discussed: hospital stay   Follow up: 3 months   Labs today  Referrals today-none  We are praying for you and wishing you the best of everything with your treatments. Call us if you need anything.  Please continue to practice social distancing to keep you, your family, and our community safe.  If you must go out, please wear a mask and practice good handwashing.  It was a pleasure to see you and I look forward to continuing to work together on your health and well-being. Please do not hesitate to call the office if you need care or have questions about your care.  Have a wonderful day and week. With Gratitude, Cherly Beach, DNP, AGNP-BC

## 2020-03-19 NOTE — Progress Notes (Signed)
Subjective:  Patient ID: [name], male    DOB: 1951/08/15  Age: 69 y.o. MRN: [id number]  CC:  Chief Complaint  Patient presents with  . Transitions Of Care    pt was dc 03-10-20 has felt better since being home       HPI  HPI  [place]io a 69 y.o.malewith medical history significant formetastatic urothelial cancer, aortic valve replacement, coronary artery disease, diastolic and systolic CHF. Patient presented to the ED on 6/16 with complaints of fever and chills that started in the afternoon at about 1 PM. Reported feeling unwell. He reported difficulty breathing that had resolved. He has bilateral nephrostomy tubes hat were inserted 6/3,he had pain from both sides, that pain was unchanged in ED. There was no surrounding erythema or discharge. Patient had increasing bilateral lower extremity swelling since he was diagnosed with bladder cancer.  Reported that the swelling now involving his scrotum. No abdominal pain, no vomiting or loose stools. He denied any surrounding erythema around nephrostomy tubes, no discharge from skin.  ED Course-From Note: Temperature 101.5, blood pressure systolic 62-952, O2 sats greater than 93% on room air. Heart rate 70s to 80s. WBC 16.7. Hemoglobin down to 8.7. Sodium 128. Normal lactic acid 1.5. BNP elevated at 1930. Urine samples withdrawn from both nephrostomy tube,-UA showed greater than 50 WBCs, large leukocytes few bacteria. Patient was started on IV ceftriaxone. Hospitalist to admit for urinary tract infection.  Patient was admitted with sepsis secondary to UTI in the setting of metastatic urothelial cancer with bilateral nephrostomy tubes.  He had his first chemotherapy several days prior to admission and was also seen by oncology during this admission.  He was noted to have urine cultures positive for Klebsiella, Pseudomonas, Enterococcus and was treated initially with IV Rocephin and then Zosyn.  He was  transitioned to oral antibiotics with ciprofloxacin and Augmentin per ID recommendations on 6/19 and has been tolerating these well and appears stable for discharge today.  He was also noted to have decompensated diastolic and systolic heart failure that has improved with IV Lasix diuresis.  He has no further lower extremity edema and is stable for discharge on his usual home Lasix, but now dose has been increased to a total of 60 mg daily with 40 mg a.m. and 20 mg p.m.  He has had no other acute events throughout this hospitalization and will have follow-ups as noted above.  Recommendations: PCP follow up which he is here for today. Updated labs which are ordered today. Almost finished with his PO antibiotics. Tolerating well per him. Is on lasix 40 mg in morning and 20 mg in evening. We called to get Dr Domenic Polite appt for follow up as well. He is to follow up with urology and oncology as scheduled.  Today he reports feeling well. He continues to eat a lot and is focused on trying to make sure he is gaining weight. He has put on 5 pounds since last weighted visit. He reports anxiety is improved. He does admit to getting overwhelmed with all the appts, but he says he has good support. Denies any other issues or concerns today.   Today patient denies signs and symptoms of COVID 19 infection including fever, chills, cough, shortness of breath, and headache. Past Medical, Surgical, Social History, Allergies, and Medications have been Reviewed.   Past Medical History:  Diagnosis Date  . Arterial embolus of lower extremity ([place]) 11/23/2013  . Arthritis   .  CAD (coronary artery disease)    a. s/p PCI with 3.5 x 18 BMS to mid RCA, non obst LAD, Lcx patent 08/10/13  . Cancer ([place]) 2021   bladder  . Depression, major, single episode, mild ([place]) 12/20/2019  . ED (erectile dysfunction) 03/27/2015  . Embolus of femoral artery [facility]) 2012   Surgical repair, previously on Coumadin  . Essential hypertension   .  ETOH abuse   . Hematuria 10/17/2019  . Hernia of abdominal cavity 02/27/2019  . History of DVT (deep vein thrombosis) 2011  . History of GI bleed    February 2012   . Ingrown left greater toenail 09/04/2018  . 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 Low oxygen saturation 01/31/2020  . 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 Pain with urination 10/17/2019  . Port-A-Cath in place 02/23/2020  . Prosthetic valve endocarditis ([place])   . PVD (peripheral vascular disease) ([place])   . S/P aortic valve replacement    Details not clear -  reportedly 1971 per patient    Current Meds  Medication Sig  . amoxicillin-clavulanate (AUGMENTIN) 875-125 MG tablet Take 1 tablet by mouth every 12 (twelve) hours for 10 days.  [name] aspirin EC 81 MG tablet Take 1 tablet (81 mg total) by mouth daily.  [name] atorvastatin (LIPITOR) 40 MG tablet Take 1 tablet by mouth once daily (Patient taking differently: Take 40 mg by mouth every evening. )  . busPIRone (BUSPAR) 10 MG tablet Take 1 tablet (10 mg total) by mouth 2 (two) times daily.  . cholecalciferol (VITAMIN D) 1000 units tablet Take 1 tablet (1,000 Units total) by mouth daily.  . ciprofloxacin (CIPRO) 500 MG tablet Take 1 tablet (500 mg total) by mouth 2 (two) times daily for 10 days.  . clopidogrel (PLAVIX) 75 MG tablet Take 1 tablet by mouth once daily (Patient taking differently: Take 75 mg by mouth daily. )  . escitalopram (LEXAPRO) 10 MG tablet Take 1 tablet (10 mg total) by mouth daily.  . fluticasone (FLONASE) 50 MCG/ACT nasal spray Place 2 sprays into both nostrils daily.  . furosemide (LASIX) 40 MG tablet Take 1 tablet (40 mg total) by mouth 2 (two) times daily. Take 40mg  in AM and 20mg  in PM.  . lidocaine-prilocaine (EMLA) cream Apply a small amount to port a cath site and cover with plastic wrap 1 hour prior to chemotherapy appointments  . metoprolol succinate (TOPROL-XL) 50 MG 24 hr  tablet TAKE 1 TABLET BY MOUTH ONCE DAILY WITH  OR  IMMEDIATELY  FOLLOWING  A  MEAL (Patient taking differently: Take 50 mg by mouth daily. )  . montelukast (SINGULAIR) 10 MG tablet TAKE 1 TABLET BY MOUTH AT BEDTIME (Patient taking differently: Take 10 mg by mouth at bedtime. )  . nitroGLYCERIN (NITROSTAT) 0.4 MG SL tablet Place 1 tablet (0.4 mg total) under the tongue every 5 (five) minutes as needed for chest pain.  . pantoprazole (PROTONIX) 40 MG tablet Take 1 tablet (40 mg total) by mouth daily before breakfast.  . prochlorperazine (COMPAZINE) 10 MG tablet Take 1 tablet (10 mg total) by mouth every 6 (six) hours as needed (Nausea or vomiting).  . trolamine salicylate (ASPERCREME) 10 % cream Apply 1 application topically daily as needed for muscle pain (for pain in feet).     ROS:  Review of Systems  Constitutional: Negative.   HENT: Negative.   Eyes:  Negative.   Respiratory: Negative.   Cardiovascular: Negative.   Gastrointestinal: Negative.   Genitourinary: Negative.   Musculoskeletal: Negative.   Skin: Negative.   Neurological: Negative.   Endo/Heme/Allergies: Negative.   Psychiatric/Behavioral: Negative.   All other systems reviewed and are negative.    Objective:   Today's Vitals: BP (!) 117/53 (BP Location: Right Arm, Patient Position: Sitting, Cuff Size: Normal)   Pulse 60   Temp (!) 97.4 F (36.3 C) (Temporal)   Resp 16   Ht 5\' 4"  (1.626 m)   Wt 118 lb 1.9 oz (53.6 kg)   SpO2 98%   BMI 20.28 kg/m  Vitals with BMI 03/19/2020 03/11/2020 03/10/2020  Height 5\' 4"  - -  Weight 118 lbs 2 oz 112 lbs 13 oz -  BMI 94.17 40.81 -  Systolic 448 185 631  Diastolic 53 58 57  Pulse 60 67 83     Physical Exam Vitals and nursing note reviewed.  Constitutional:      Appearance: Normal appearance. He is well-developed, well-groomed and normal weight.  HENT:     Head: Normocephalic and atraumatic.     Right Ear: External ear normal.     Left Ear: External ear normal.      Mouth/Throat:     Comments: Mask in place  Eyes:     General:        Right eye: No discharge.        Left eye: No discharge.     Conjunctiva/sclera: Conjunctivae normal.  Cardiovascular:     Rate and Rhythm: Normal rate and regular rhythm.     Pulses: Normal pulses.     Heart sounds: Normal heart sounds.  Pulmonary:     Effort: Pulmonary effort is normal.     Breath sounds: Normal breath sounds.  Genitourinary:    Comments: Bilateral nephrostomy bags present with yellow urine noted Musculoskeletal:        General: Normal range of motion.     Cervical back: Normal range of motion and neck supple.  Skin:    General: Skin is warm.  Neurological:     General: No focal deficit present.     Mental Status: He is alert and oriented to person, place, and time.  Psychiatric:        Attention and Perception: Attention normal.        Mood and Affect: Mood normal.        Speech: Speech normal.        Behavior: Behavior normal. Behavior is cooperative.        Thought Content: Thought content normal.        Cognition and Memory: Cognition normal.        Judgment: Judgment normal.     Assessment   1. Encounter for support and coordination of transition of care   2. Metastatic urothelial carcinoma ([place])   3. Essential hypertension, benign   4. Urinary tract infection with hematuria, site unspecified   5. Nervously anxious     Tests ordered Orders Placed This Encounter  Procedures  . CBC  . COMPLETE METABOLIC PANEL WITH GFR     Plan: Please see assessment and plan per problem list above.   No orders of the defined types were placed in this encounter.   Patient to follow-up in 05/17/2020.  Perlie Mayo, NP

## 2020-03-19 NOTE — Assessment & Plan Note (Signed)
Improved.  Denies having any SI or HI.  Reports he is not really taking his medication as directed.  He is being closely followed now by oncology hematology and hopefully if anything is to change they will address it.  I have encouraged him to take the Lexapro at least on a regular basis so that he can help navigate everything is about the,.  He reports that he will start doing that.

## 2020-03-20 LAB — COMPLETE METABOLIC PANEL WITH GFR
AG Ratio: 1.2 (calc) (ref 1.0–2.5)
ALT: 14 U/L (ref 9–46)
AST: 19 U/L (ref 10–35)
Albumin: 3.7 g/dL (ref 3.6–5.1)
Alkaline phosphatase (APISO): 203 U/L — ABNORMAL HIGH (ref 35–144)
BUN: 20 mg/dL (ref 7–25)
CO2: 26 mmol/L (ref 20–32)
Calcium: 8.2 mg/dL — ABNORMAL LOW (ref 8.6–10.3)
Chloride: 92 mmol/L — ABNORMAL LOW (ref 98–110)
Creat: 1.06 mg/dL (ref 0.70–1.25)
GFR, Est African American: 83 mL/min/{1.73_m2} (ref >=60)
GFR, Est Non African American: 72 mL/min/{1.73_m2} (ref >=60)
Globulin: 3.1 g/dL (calc) (ref 1.9–3.7)
Glucose, Bld: 88 mg/dL (ref 65–139)
Potassium: 3.9 mmol/L (ref 3.5–5.3)
Sodium: 128 mmol/L — ABNORMAL LOW (ref 135–146)
Total Bilirubin: 0.8 mg/dL (ref 0.2–1.2)
Total Protein: 6.8 g/dL (ref 6.1–8.1)

## 2020-03-20 LAB — CBC
HCT: 29.8 % — ABNORMAL LOW (ref 38.5–50.0)
Hemoglobin: 9.2 g/dL — ABNORMAL LOW (ref 13.2–17.1)
MCH: 23.4 pg — ABNORMAL LOW (ref 27.0–33.0)
MCHC: 30.9 g/dL — ABNORMAL LOW (ref 32.0–36.0)
MCV: 75.8 fL — ABNORMAL LOW (ref 80.0–100.0)
MPV: 9.6 fL (ref 7.5–12.5)
Platelets: 243 10*3/uL (ref 140–400)
RBC: 3.93 10*6/uL — ABNORMAL LOW (ref 4.20–5.80)
RDW: 21.2 % — ABNORMAL HIGH (ref 11.0–15.0)
WBC: 5 10*3/uL (ref 3.8–10.8)

## 2020-03-21 ENCOUNTER — Other Ambulatory Visit: Payer: Self-pay | Admitting: Family Medicine

## 2020-03-21 ENCOUNTER — Ambulatory Visit (INDEPENDENT_AMBULATORY_CARE_PROVIDER_SITE_OTHER): Payer: Medicare Other | Admitting: Urology

## 2020-03-21 ENCOUNTER — Other Ambulatory Visit: Payer: Self-pay

## 2020-03-21 VITALS — BP 119/54 | HR 56 | Temp 97.7°F | Ht 64.0 in | Wt 128.0 lb

## 2020-03-21 DIAGNOSIS — C678 Malignant neoplasm of overlapping sites of bladder: Secondary | ICD-10-CM

## 2020-03-21 MED ORDER — ATORVASTATIN CALCIUM 40 MG PO TABS: 40.0000 mg | ORAL_TABLET | Freq: Every day | ORAL | 1 refills | Status: AC

## 2020-03-21 NOTE — Progress Notes (Signed)

## 2020-03-22 ENCOUNTER — Other Ambulatory Visit: Payer: Self-pay

## 2020-03-22 MED ORDER — MONTELUKAST SODIUM 10 MG PO TABS: 10.0000 mg | ORAL_TABLET | Freq: Every day | ORAL | 0 refills | Status: AC

## 2020-03-22 MED ORDER — FUROSEMIDE 40 MG PO TABS: 40.0000 mg | ORAL_TABLET | Freq: Two times a day (BID) | ORAL | 0 refills | Status: DC

## 2020-03-22 MED ORDER — FLUTICASONE PROPIONATE 50 MCG/ACT NA SUSP: NASAL | 0 refills | Status: AC

## 2020-03-22 MED ORDER — METOPROLOL SUCCINATE ER 50 MG PO TB24: ORAL_TABLET | ORAL | 0 refills | Status: DC

## 2020-03-27 ENCOUNTER — Other Ambulatory Visit: Payer: Self-pay

## 2020-03-27 ENCOUNTER — Encounter (INDEPENDENT_AMBULATORY_CARE_PROVIDER_SITE_OTHER): Payer: Self-pay | Admitting: Gastroenterology

## 2020-03-27 ENCOUNTER — Ambulatory Visit (INDEPENDENT_AMBULATORY_CARE_PROVIDER_SITE_OTHER): Payer: Medicare Other | Admitting: Gastroenterology

## 2020-03-27 ENCOUNTER — Encounter (INDEPENDENT_AMBULATORY_CARE_PROVIDER_SITE_OTHER): Payer: Self-pay | Admitting: *Deleted

## 2020-03-27 DIAGNOSIS — R112 Nausea with vomiting, unspecified: Secondary | ICD-10-CM

## 2020-03-27 DIAGNOSIS — C7910 Secondary malignant neoplasm of unspecified urinary organs: Secondary | ICD-10-CM | POA: Diagnosis not present

## 2020-03-27 DIAGNOSIS — D509 Iron deficiency anemia, unspecified: Secondary | ICD-10-CM | POA: Insufficient documentation

## 2020-03-27 DIAGNOSIS — Z95828 Presence of other vascular implants and grafts: Secondary | ICD-10-CM

## 2020-03-27 DIAGNOSIS — D508 Other iron deficiency anemias: Secondary | ICD-10-CM | POA: Diagnosis not present

## 2020-03-27 MED ORDER — PROCHLORPERAZINE MALEATE 10 MG PO TABS: 10.0000 mg | ORAL_TABLET | Freq: Three times a day (TID) | ORAL | 1 refills | Status: DC | PRN

## 2020-03-27 NOTE — Progress Notes (Signed)
[name], M.D. Gastroenterology & Hepatology [facility] [address] [place], [place] [address]  [address] [place] [address]  Referring MD: self  I will communicate my assessment and recommendations to the referring MD via EMR. "Note: Occasional unusual wording and randomly placed punctuation marks may result from the use of speech recognition technology to transcribe this document"  Chief Complaint: IDA  History of Present Illness: [name] is a 69 y.o. 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  The patient was admitted to the hospital in mid May 2021.  At that time the patient was presenting dyspnea when attending his PCPs office and was sent to the ER.  He was found to have a hemoglobin of 6.2.  He had a negative stool guaiac.  Was transfused 2 units of PRBC his hemoglobin increased to 8.7 g.Lab results at that time showed vitamin B12 of 243 normal, folate of 11.8 normal, iron of 9 and TIBC of 3 showing iron deficiency, ferritin of 5, reticulocyte count was 1.8%.  Due to these alterations, the patient was evaluated by the inpatient gastroenterology service who performed an EGD and colonoscopy on 02/02/2020, upper endoscopy showed grade a esophagitis, 2 cm hiatal hernia, colonoscopy was positive for diverticulosis, small polyp in the splenic flexure which was removed with surgical pathology consistent with tubular adenoma. Counseled to undergo a capsule endoscopy.  The patient was discharged home after this and has follow-up with oncology since then. Most recently, patient was hospitalized due to a UTI sepsis episode for which she required antibiotics.  He has received an infusion of IV iron in the past x2 with adequate response of his hemoglobin, most recent hemoglobin on 03/19/2020 was 9.2 with an MCV of 75 BUN was 20, sodium 128 chloride 92 liver function tests have been within normal limits.  He reports feeling nauseated on a daily basis, reported that he vomits once or twice a week.  He reports that he had the symptoms after he was given his first doses of chemotherapy.  He reports that he improved while taking Compazine as needed, every other day.  However, he ran out of his medication and he believes this is the reason why the nausea came back.  Otherwise, he denies having any episodes of rectal bleeding, melena, abdominal pain, hematemesis, fever, chills.  FHx: neg for any gastrointestinal/liver disease, no malignancies Social: Patient has done multiple drugs in the past including cocaine and heroine, currently smokes marijuana a couple of puffs every day, does not drink alcohol or smoke any cigarettes.  Past Medical History: Past Medical History:  Diagnosis Date  . Arterial embolus of lower extremity ([place]) 11/23/2013  . Arthritis   . CAD (coronary artery disease)    a. s/p PCI with 3.5 x 18 BMS to mid RCA, non obst LAD, Lcx patent 08/10/13  . Cancer ([place]) 2021   bladder  . Depression, major, single episode, mild ([place]) 12/20/2019  . ED (erectile dysfunction) 03/27/2015  . Embolus of femoral artery [facility]) 2012   Surgical repair, previously on Coumadin  . Essential hypertension   .  ETOH abuse   . Hematuria 10/17/2019  . Hernia of abdominal cavity 02/27/2019  . History of DVT (deep vein thrombosis) 2011  . History of GI bleed    February 2012   . Ingrown left greater toenail 09/04/2018  . 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 Low oxygen saturation  01/31/2020  . 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 Pain with urination 10/17/2019  . Port-A-Cath in place 02/23/2020  . Prosthetic valve endocarditis ([place])   . PVD (peripheral vascular disease) ([place])   . S/P aortic valve replacement    Details not clear -  reportedly 1971 per patient    Past Surgical History: Past Surgical History:  Procedure Laterality Date  . ABDOMINAL AORTOGRAM W/LOWER EXTREMITY N/A 10/31/2018   Procedure: ABDOMINAL AORTOGRAM W/LOWER EXTREMITY;  Surgeon: Lorretta Harp, MD;  Location: [place] [facility];  Service: Cardiovascular;  Laterality: N/A;  . Aoric valve replacement  January 2005   37mm Medtronic Freestyle stentless porcine bioprosthesis [facility])  . CARDIAC CATHETERIZATION     30-40% mid LAD, 20-30% ostial D1, proximal 30-40% RCA, 95% mid RCA thrombus, 40% distal stenosis. EF 45%, inferior HK.  [name] COLONOSCOPY N/A 04/20/2014   Procedure: COLONOSCOPY;  Surgeon: Rogene Houston, MD;  [facility];  Service: Endoscopy;  Laterality: N/A;  930  . COLONOSCOPY WITH PROPOFOL N/A 02/02/2020   Procedure: COLONOSCOPY WITH PROPOFOL;  Surgeon: Rogene Houston, MD;  [facility];  Service: Endoscopy;  Laterality: N/A;  . CORONARY ANGIOPLASTY WITH STENT PLACEMENT  07/2013  . ESOPHAGOGASTRODUODENOSCOPY (EGD) WITH PROPOFOL N/A 02/02/2020   Procedure: ESOPHAGOGASTRODUODENOSCOPY (EGD) WITH PROPOFOL;  Surgeon: Rogene Houston, MD;  [facility];  Service: Endoscopy;  Laterality: N/A;  . INGUINAL HERNIA REPAIR Bilateral 04/17/2019   Procedure: ATTEMPTED LAPAROSCOPIC BILATERAL INGUINAL HERNIA REPAIR;  Surgeon: Coralie Keens, MD;  Location: [place];  Service: General;  Laterality: Bilateral;  . INGUINAL HERNIA REPAIR Bilateral 04/17/2019   Procedure: Hernia Repair Inguinal Adult;  Surgeon: Coralie Keens, MD;  Location: [place];  Service: General;  Laterality: Bilateral;   . IR IMAGING GUIDED PORT INSERTION  02/22/2020  . IR NEPHROSTOMY PLACEMENT LEFT  02/22/2020  . IR NEPHROSTOMY PLACEMENT RIGHT  02/22/2020  . LEFT HEART CATHETERIZATION WITH CORONARY ANGIOGRAM N/A 08/10/2013   Procedure: LEFT HEART CATHETERIZATION WITH CORONARY ANGIOGRAM;  Surgeon: Blane Ohara, MD;  Location[facility] C[facility] LAB;  Service: Cardiovascular;  Laterality: N/A;  . Left leg embolectomy  May 2012   Dr. Trula Slade - reason for lifelong coumadin  . LYMPH NODE BIOPSY Left 11/21/2019   Procedure: Left Neck LYMPH NODE BIOPSY;  Surgeon: Rozetta Nunnery, MD;  Location: [place];  Service: ENT;  Laterality: Left;  . PERIPHERAL VASCULAR ATHERECTOMY  10/31/2018   Procedure: PERIPHERAL VASCULAR ATHERECTOMY;  Surgeon: Lorretta Harp, MD;  Location: [place] [facility];  Service: Cardiovascular;;  left SFA  . POLYPECTOMY  02/02/2020   Procedure: POLYPECTOMY;  Surgeon: Rogene Houston, MD;  [facility];  Service: Endoscopy;;  splenic flexure    Family History: Family History  Problem Relation Age of Onset  . Diabetes Mother   . Cirrhosis Father   . COPD Sister     Social History: Social History   Tobacco Use  Smoking Status Former Smoker  . Packs/day: 0.50  . Years: 3.00  . Pack years: 1.50  .  Types: Cigarettes  . Start date: 10/12/1965  . Quit date: 02/19/1970  . Years since quitting: 50.1  Smokeless Tobacco Never Used   Social History   Substance and Sexual Activity  Alcohol Use Not Currently   Social History   Substance and Sexual Activity  Drug Use Yes  . Types: Marijuana   Comment: pt smokes weed once in the morning and once in the evening    Allergies: No Known Allergies  Medications: Current Outpatient Medications  Medication Sig Dispense Refill  . aspirin EC 81 MG tablet Take 1 tablet (81 mg total) by mouth daily. 90 tablet 3  . atorvastatin (LIPITOR) 40 MG tablet Take 1 tablet (40 mg total) by mouth daily. 90 tablet 1  . busPIRone (BUSPAR) 10 MG tablet  Take 1 tablet (10 mg total) by mouth 2 (two) times daily. 60 tablet 2  . cholecalciferol (VITAMIN D) 1000 units tablet Take 1 tablet (1,000 Units total) by mouth daily. 90 tablet 3  . clopidogrel (PLAVIX) 75 MG tablet Take 1 tablet by mouth once daily 90 tablet 0  . escitalopram (LEXAPRO) 10 MG tablet Take 1 tablet (10 mg total) by mouth daily. 30 tablet 1  . fluticasone (FLONASE) 50 MCG/ACT nasal spray Use 2 spray(s) in each nostril once daily 16 g 0  . furosemide (LASIX) 40 MG tablet Take 1 tablet (40 mg total) by mouth 2 (two) times daily. Take 40mg  in AM and 20mg  in PM. 90 tablet 0  . lidocaine-prilocaine (EMLA) cream Apply a small amount to port a cath site and cover with plastic wrap 1 hour prior to chemotherapy appointments 30 g 3  . metoprolol succinate (TOPROL-XL) 50 MG 24 hr tablet TAKE 1 TABLET BY MOUTH ONCE DAILY WITH  OR  IMMEDIATELY  FOLLOWING  A  MEAL 90 tablet 0  . montelukast (SINGULAIR) 10 MG tablet Take 1 tablet (10 mg total) by mouth at bedtime. 90 tablet 0  . nitroGLYCERIN (NITROSTAT) 0.4 MG SL tablet Place 1 tablet (0.4 mg total) under the tongue every 5 (five) minutes as needed for chest pain. 25 tablet 0  . pantoprazole (PROTONIX) 40 MG tablet Take 1 tablet (40 mg total) by mouth daily before breakfast. 30 tablet 2  . trolamine salicylate (ASPERCREME) 10 % cream Apply 1 application topically daily as needed for muscle pain (for pain in feet).     . prochlorperazine (COMPAZINE) 10 MG tablet Take 1 tablet (10 mg total) by mouth every 8 (eight) hours as needed (Nausea or vomiting). 30 tablet 1   No current facility-administered medications for this visit.    Review of Systems: GENERAL: negative for malaise, significant weight loss, night sweats and fever HEENT: No changes in hearing or vision, no nose bleeds or other nasal problems. No trouble swallowing NECK: Negative for lumps, goiter, pain and significant neck swelling RESPIRATORY: Negative for cough, wheezing and  shortness of breath CARDIOVASCULAR: Negative for chest pain, leg swelling, palpitations, orthopnea GI: SEE HPI MUSCULOSKELETAL: Negative for joint pain or swelling, back pain, and muscle pain. SKIN: Negative for lesions, rash, and itching. PSYCH: Negative for sleep disturbance, mood disorder and recent psychosocial stressors. HEMATOLOGY Negative for prolonged bleeding, bruising easily, and swollen nodes. ENDOCRINE: Negative for cold or heat intolerance, polyuria, polydipsia and goiter. NEURO: negative for lightheadedness, dizziness, tremor, gait imbalance, syncope and seizures. The remainder of the review of systems is noncontributory.   Physical Exam: BP 135/70   Temp 98.7 F (37.1 C)   Ht 5\' 4"  (1.626  m)   Wt 125 lb 4.8 oz (56.8 kg)   BMI 21.51 kg/m  GENERAL: The patient is AO x3, in no acute distress. HEENT: Head is normocephalic and atraumatic. EOMI are intact. Mouth is well hydrated and without lesions. NECK: Supple. No masses LUNGS: Clear to auscultation. No presence of rhonchi/wheezing/rales. Adequate chest expansion HEART: RRR, normal s1 and s2. ABDOMEN: Soft, nontender, no guarding, no peritoneal signs, and nondistended. BS +. No masses. EXTREMITIES: Without any cyanosis, clubbing, rash, lesions. Has right sided pitting lower extremity edema NEUROLOGIC: AOx3, no focal motor deficit. SKIN: no jaundice, no rashes   Imaging/Labs: as above  I personally reviewed and interpreted the available labs, imaging and endoscopic files.  Impression and Plan: [name] is a 69 y.o. 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  The patient has not presented any episodes of bleeding, and underwent an extensive endoscopic evaluation recently that did not show any source for his profound iron deficiency anemia.  He has responded adequately to IV iron infusion.  It is possible that his iron deficiency is related to malabsorption but we will rule out any small bowel lesions with capsule endoscopy.  He should continue undergoing his care with oncology and follow-up closely his hemoglobin.  His nausea seems to be related to his recent chemotherapy, for which he would benefit from restarting his Compazine as prior.  We will give 1 month supply but he will need to discuss with his oncologist they recommend the regimen per their protocol.  -Schedule capsule endoscopy -Continue with prochlorperazine 10mg  q8h as needed for nausea  -Follow up with oncology regarding chemo-related nausea and IV iron infusions  All questions were answered.      Harvel Quale, MD

## 2020-03-27 NOTE — Patient Instructions (Addendum)
Schedule capsule endoscopy Continue with prochlorperazine for nausea as needed Follow up with oncology regarding chemo-related nausea

## 2020-03-28 ENCOUNTER — Other Ambulatory Visit (INDEPENDENT_AMBULATORY_CARE_PROVIDER_SITE_OTHER): Payer: Self-pay | Admitting: *Deleted

## 2020-03-28 ENCOUNTER — Inpatient Hospital Stay (HOSPITAL_COMMUNITY): Payer: Medicare Other | Attending: Hematology

## 2020-03-28 ENCOUNTER — Inpatient Hospital Stay (HOSPITAL_BASED_OUTPATIENT_CLINIC_OR_DEPARTMENT_OTHER): Payer: Medicare Other | Admitting: Hematology

## 2020-03-28 ENCOUNTER — Inpatient Hospital Stay (HOSPITAL_COMMUNITY): Payer: Medicare Other

## 2020-03-28 VITALS — BP 150/74 | HR 74 | Temp 97.7°F | Resp 18

## 2020-03-28 DIAGNOSIS — I739 Peripheral vascular disease, unspecified: Secondary | ICD-10-CM | POA: Diagnosis not present

## 2020-03-28 DIAGNOSIS — M7989 Other specified soft tissue disorders: Secondary | ICD-10-CM | POA: Insufficient documentation

## 2020-03-28 DIAGNOSIS — C679 Malignant neoplasm of bladder, unspecified: Secondary | ICD-10-CM | POA: Insufficient documentation

## 2020-03-28 DIAGNOSIS — D509 Iron deficiency anemia, unspecified: Secondary | ICD-10-CM | POA: Diagnosis not present

## 2020-03-28 DIAGNOSIS — Z5111 Encounter for antineoplastic chemotherapy: Secondary | ICD-10-CM | POA: Insufficient documentation

## 2020-03-28 DIAGNOSIS — Z86718 Personal history of other venous thrombosis and embolism: Secondary | ICD-10-CM | POA: Diagnosis not present

## 2020-03-28 DIAGNOSIS — Z7902 Long term (current) use of antithrombotics/antiplatelets: Secondary | ICD-10-CM | POA: Insufficient documentation

## 2020-03-28 DIAGNOSIS — I2510 Atherosclerotic heart disease of native coronary artery without angina pectoris: Secondary | ICD-10-CM | POA: Diagnosis not present

## 2020-03-28 DIAGNOSIS — Z87891 Personal history of nicotine dependence: Secondary | ICD-10-CM | POA: Insufficient documentation

## 2020-03-28 DIAGNOSIS — C7951 Secondary malignant neoplasm of bone: Secondary | ICD-10-CM | POA: Diagnosis not present

## 2020-03-28 DIAGNOSIS — Z7982 Long term (current) use of aspirin: Secondary | ICD-10-CM | POA: Diagnosis not present

## 2020-03-28 DIAGNOSIS — Z79899 Other long term (current) drug therapy: Secondary | ICD-10-CM | POA: Insufficient documentation

## 2020-03-28 DIAGNOSIS — R112 Nausea with vomiting, unspecified: Secondary | ICD-10-CM | POA: Insufficient documentation

## 2020-03-28 DIAGNOSIS — Z95828 Presence of other vascular implants and grafts: Secondary | ICD-10-CM

## 2020-03-28 DIAGNOSIS — N1330 Unspecified hydronephrosis: Secondary | ICD-10-CM | POA: Insufficient documentation

## 2020-03-28 DIAGNOSIS — C7910 Secondary malignant neoplasm of unspecified urinary organs: Secondary | ICD-10-CM

## 2020-03-28 DIAGNOSIS — R0602 Shortness of breath: Secondary | ICD-10-CM | POA: Diagnosis not present

## 2020-03-28 DIAGNOSIS — R600 Localized edema: Secondary | ICD-10-CM | POA: Insufficient documentation

## 2020-03-28 LAB — COMPREHENSIVE METABOLIC PANEL
ALT: 37 U/L (ref 0–44)
AST: 37 U/L (ref 15–41)
Albumin: 3.3 g/dL — ABNORMAL LOW (ref 3.5–5.0)
Alkaline Phosphatase: 298 U/L — ABNORMAL HIGH (ref 38–126)
Anion gap: 10 (ref 5–15)
BUN: 12 mg/dL (ref 8–23)
CO2: 24 mmol/L (ref 22–32)
Calcium: 8.1 mg/dL — ABNORMAL LOW (ref 8.9–10.3)
Chloride: 96 mmol/L — ABNORMAL LOW (ref 98–111)
Creatinine, Ser: 0.8 mg/dL (ref 0.61–1.24)
GFR calc Af Amer: >60 mL/min (ref >60)
GFR calc non Af Amer: >60 mL/min (ref >60)
Glucose, Bld: 116 mg/dL — ABNORMAL HIGH (ref 70–99)
Potassium: 3.8 mmol/L (ref 3.5–5.1)
Sodium: 130 mmol/L — ABNORMAL LOW (ref 135–145)
Total Bilirubin: 0.6 mg/dL (ref 0.3–1.2)
Total Protein: 6.8 g/dL (ref 6.5–8.1)

## 2020-03-28 LAB — CBC WITH DIFFERENTIAL/PLATELET
Abs Immature Granulocytes: 0.03 10*3/uL (ref 0.00–0.07)
Basophils Absolute: 0 10*3/uL (ref 0.0–0.1)
Basophils Relative: 0 %
Eosinophils Absolute: 0.1 10*3/uL (ref 0.0–0.5)
Eosinophils Relative: 2 %
HCT: 29.2 % — ABNORMAL LOW (ref 39.0–52.0)
Hemoglobin: 9.2 g/dL — ABNORMAL LOW (ref 13.0–17.0)
Immature Granulocytes: 1 %
Lymphocytes Relative: 16 %
Lymphs Abs: 0.9 10*3/uL (ref 0.7–4.0)
MCH: 24.2 pg — ABNORMAL LOW (ref 26.0–34.0)
MCHC: 31.5 g/dL (ref 30.0–36.0)
MCV: 76.8 fL — ABNORMAL LOW (ref 80.0–100.0)
Monocytes Absolute: 0.5 10*3/uL (ref 0.1–1.0)
Monocytes Relative: 10 %
Neutro Abs: 4 10*3/uL (ref 1.7–7.7)
Neutrophils Relative %: 71 %
Platelets: 228 10*3/uL (ref 150–400)
RBC: 3.8 MIL/uL — ABNORMAL LOW (ref 4.22–5.81)
RDW: 23.9 % — ABNORMAL HIGH (ref 11.5–15.5)
WBC: 5.5 10*3/uL (ref 4.0–10.5)
nRBC: 0 % (ref 0.0–0.2)

## 2020-03-28 LAB — MAGNESIUM: Magnesium: 1.5 mg/dL — ABNORMAL LOW (ref 1.7–2.4)

## 2020-03-28 MED ORDER — SODIUM CHLORIDE 0.9% FLUSH
10.0000 mL | INTRAVENOUS | Status: DC | PRN
  Administered 2020-03-28: 10 mL

## 2020-03-28 MED ORDER — HEPARIN SOD (PORK) LOCK FLUSH 100 UNIT/ML IV SOLN
500.0000 [IU] | Freq: Once | INTRAVENOUS | Status: AC | PRN
  Administered 2020-03-28: 500 [IU]

## 2020-03-28 MED ORDER — SODIUM CHLORIDE 0.9 % IV SOLN
800.0000 mg/m2 | Freq: Once | INTRAVENOUS | Status: AC
  Administered 2020-03-28: 1254 mg via INTRAVENOUS
  Filled 2020-03-28: qty 26.3

## 2020-03-28 MED ORDER — PROCHLORPERAZINE MALEATE 10 MG PO TABS: 10.0000 mg | ORAL_TABLET | Freq: Four times a day (QID) | ORAL | 3 refills | Status: DC | PRN

## 2020-03-28 MED ORDER — PALONOSETRON HCL INJECTION 0.25 MG/5ML
INTRAVENOUS | Status: AC
  Filled 2020-03-28: qty 5

## 2020-03-28 MED ORDER — PALONOSETRON HCL INJECTION 0.25 MG/5ML
0.2500 mg | Freq: Once | INTRAVENOUS | Status: AC
  Administered 2020-03-28: 0.25 mg via INTRAVENOUS

## 2020-03-28 MED ORDER — SODIUM CHLORIDE 0.9 % IV SOLN: Freq: Once | INTRAVENOUS | Status: DC

## 2020-03-28 MED ORDER — SODIUM CHLORIDE 0.9 % IV SOLN
150.0000 mg | Freq: Once | INTRAVENOUS | Status: AC
  Administered 2020-03-28: 150 mg via INTRAVENOUS
  Filled 2020-03-28: qty 150

## 2020-03-28 MED ORDER — SODIUM CHLORIDE 0.9 % IV SOLN
10.0000 mg | Freq: Once | INTRAVENOUS | Status: AC
  Administered 2020-03-28: 10 mg via INTRAVENOUS
  Filled 2020-03-28: qty 10

## 2020-03-28 MED ORDER — SODIUM CHLORIDE 0.9 % IV SOLN
Freq: Once | INTRAVENOUS | Status: AC
  Filled 2020-03-28: qty 1000

## 2020-03-28 MED ORDER — SODIUM CHLORIDE 0.9 % IV SOLN
397.0000 mg | Freq: Once | INTRAVENOUS | Status: AC
  Administered 2020-03-28: 400 mg via INTRAVENOUS
  Filled 2020-03-28: qty 40

## 2020-03-28 MED ORDER — SODIUM CHLORIDE 0.9 % IV SOLN: Freq: Once | INTRAVENOUS | Status: AC

## 2020-03-28 MED ORDER — SODIUM CHLORIDE 0.9 % IV SOLN
510.0000 mg | Freq: Once | INTRAVENOUS | Status: AC
  Administered 2020-03-28: 510 mg via INTRAVENOUS
  Filled 2020-03-28: qty 510

## 2020-03-28 NOTE — Progress Notes (Signed)
[name],  [id number]   CLINIC:  Medical Oncology/Hematology  PCP:  [name], MD [address], [place] / [place] [address] [phone]   REASON FOR VISIT:  Follow-up for metastatic bladder cancer  PRIOR THERAPY: None  NGS Results: PD-L1 CPS 1%  CURRENT THERAPY: Gemcitabine and carboplatin  BRIEF ONCOLOGIC HISTORY:  Oncology History  Metastatic urothelial carcinoma ([place])  02/13/2020 Initial Diagnosis   Metastatic urothelial carcinoma ([place])   02/23/2020 Genetic Testing   PD-L1      02/29/2020 Genetic Testing   Foundation One     03/04/2020 -  Chemotherapy   The patient had palonosetron (ALOXI) injection 0.25 mg, 0.25 mg, Intravenous,  Once, 1 of 4 cycles Administration: 0.25 mg (03/04/2020) CARBOplatin (PARAPLATIN) 400 mg in sodium chloride 0.9 % 250 mL chemo infusion, 400 mg (100 % of original dose 397 mg), Intravenous,  Once, 1 of 4 cycles Dose modification:   (original dose 397 mg, Cycle 1) Administration: 400 mg (03/04/2020) gemcitabine (GEMZAR) 1,558 mg in sodium chloride 0.9 % 250 mL chemo infusion, 1,000 mg/m2 = 1,558 mg, Intravenous,  Once, 1 of 4 cycles Administration: 1,558 mg (03/04/2020) fosaprepitant (EMEND) 150 mg in sodium chloride 0.9 % 145 mL IVPB, 150 mg, Intravenous,  Once, 1 of 4 cycles Administration: 150 mg (03/04/2020)  for chemotherapy treatment.      CANCER STAGING: Cancer Staging No matching staging information was found for the patient.  INTERVAL HISTORY:  [name], a 69 y.o. male, returns for routine follow-up and consideration for next cycle of chemotherapy. [name] was last seen on 03/04/2020.  Due for cycle #2 of gemcitabine and carboplatin today.   He went to [place] on 03/06/2020 for a UTI and was treated with IV ceftriaxone and discharged with ciprofloxacin and Augmentin for 10 days. Today he reports feeling much better since discharge. He denies any more F/C.  He reports that since his hospital discharge, his right leg has started swelling again.  He will have a Givens capsule study done on 04/16/2020 at [facility]. He reports nausea and vomiting today and multiple episodes last week, vomiting back the food that he ate, since he ran out of his Compazine. Otherwise his appetite is good.  Overall, he feels ready for next cycle of chemo today.    REVIEW OF SYSTEMS:  Review of Systems  Constitutional: Negative for appetite change, chills, fatigue and fever.  Respiratory: Positive for shortness of breath.   Cardiovascular: Positive for leg swelling (R leg).  Gastrointestinal: Positive for nausea and vomiting.  All other systems reviewed and are negative.   PAST MEDICAL/SURGICAL HISTORY:  Past Medical History:  Diagnosis Date  . Arterial embolus of lower extremity ([place]) 11/23/2013  . Arthritis   . CAD (coronary artery disease)    a. s/p PCI with 3.5 x 18 BMS to mid RCA, non obst LAD, Lcx patent 08/10/13  . Cancer ([place]) 2021   bladder  . Depression, major, single episode, mild ([place]) 12/20/2019  . ED (erectile dysfunction) 03/27/2015  . Embolus of femoral artery [facility]) 2012   Surgical repair, previously on Coumadin  . Essential hypertension   . ETOH abuse   . Hematuria 10/17/2019  . Hernia of abdominal cavity 02/27/2019  . History of DVT (deep vein thrombosis) 2011  . History of GI bleed    February 2012   . Ingrown left greater toenail 09/04/2018  . 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 Low oxygen saturation 01/31/2020  . 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 Pain with urination 10/17/2019  . Port-A-Cath in place 02/23/2020  . Prosthetic valve endocarditis ([place])   . PVD (peripheral vascular disease) ([place])   . S/P aortic valve replacement    Details not clear -  reportedly 1971 per patient   Past Surgical History:  Procedure Laterality Date  . ABDOMINAL AORTOGRAM  W/LOWER EXTREMITY N/A 10/31/2018   Procedure: ABDOMINAL AORTOGRAM W/LOWER EXTREMITY;  Surgeon: Lorretta Harp, MD;  Location: [place] [facility];  Service: Cardiovascular;  Laterality: N/A;  . Aoric valve replacement  January 2005   45m Medtronic Freestyle stentless porcine bioprosthesis ([facility]  . CARDIAC CATHETERIZATION     30-40% mid LAD, 20-30% ostial D1, proximal 30-40% RCA, 95% mid RCA thrombus, 40% distal stenosis. EF 45%, inferior HK.  .Marland KitchenCOLONOSCOPY N/A 04/20/2014   Procedure: COLONOSCOPY;  Surgeon: NRogene Houston MD;  [facility];  Service: Endoscopy;  Laterality: N/A;  930  . COLONOSCOPY WITH PROPOFOL N/A 02/02/2020   Procedure: COLONOSCOPY WITH PROPOFOL;  Surgeon: RRogene Houston MD;  [facility];  Service: Endoscopy;  Laterality: N/A;  . CORONARY ANGIOPLASTY WITH STENT PLACEMENT  07/2013  . ESOPHAGOGASTRODUODENOSCOPY (EGD) WITH PROPOFOL N/A 02/02/2020   Procedure: ESOPHAGOGASTRODUODENOSCOPY (EGD) WITH PROPOFOL;  Surgeon: RRogene Houston MD;  [facility];  Service: Endoscopy;  Laterality: N/A;  . INGUINAL HERNIA REPAIR Bilateral 04/17/2019   Procedure: ATTEMPTED LAPAROSCOPIC BILATERAL INGUINAL HERNIA REPAIR;  Surgeon: BCoralie Keens MD;  Location: [facility]  Service: General;  Laterality: Bilateral;  . INGUINAL HERNIA REPAIR Bilateral 04/17/2019   Procedure: Hernia Repair Inguinal Adult;  Surgeon: BCoralie Keens MD;  Location: [facility]  Service: General;  Laterality: Bilateral;  . IR IMAGING GUIDED PORT INSERTION  02/22/2020  . IR NEPHROSTOMY PLACEMENT LEFT  02/22/2020  . IR NEPHROSTOMY PLACEMENT RIGHT  02/22/2020  . LEFT HEART CATHETERIZATION WITH CORONARY ANGIOGRAM N/A 08/10/2013   Procedure: LEFT HEART CATHETERIZATION WITH CORONARY ANGIOGRAM;  Surgeon: MBlane Ohara MD;  [facility];  Service: Cardiovascular;  Laterality: N/A;  . Left leg embolectomy  May 2012   Dr. BTrula Slade- reason for lifelong coumadin  . LYMPH NODE BIOPSY Left  11/21/2019   Procedure: Left Neck LYMPH NODE BIOPSY;  Surgeon: NRozetta Nunnery MD;  [facility]  Service: ENT;  Laterality: Left;  . PERIPHERAL VASCULAR ATHERECTOMY  10/31/2018   Procedure: PERIPHERAL VASCULAR ATHERECTOMY;  Surgeon: BLorretta Harp MD;  [facility];  Service: Cardiovascular;;  left SFA  . POLYPECTOMY  02/02/2020   Procedure: POLYPECTOMY;  Surgeon: RRogene Houston MD;  [facility];  Service: Endoscopy;;  splenic flexure    SOCIAL HISTORY:  Social History   Socioeconomic History  . Marital status: Single    Spouse name: Not on file  . Number of children: 2  . Years of education: Not on file  . Highest education level: 11th grade  Occupational History  . Occupation: disability  Tobacco Use  . Smoking status: Former Smoker    Packs/day: 0.50    Years: 3.00    Pack years: 1.50    Types: Cigarettes    Start date: 10/12/1965    Quit date: 02/19/1970    Years since quitting: 50.1  . Smokeless tobacco: Never Used  Vaping Use  .  Vaping Use: Never used  Substance and Sexual Activity  . Alcohol use: Not Currently  . Drug use: Yes    Types: Marijuana    Comment: pt smokes weed once in the morning and once in the evening  . Sexual activity: Not Currently  Other Topics Concern  . Not on file  Social History Narrative  . Not on file   Social Determinants of Health   Financial Resource Strain:   . Difficulty of Paying Living Expenses:   Food Insecurity:   . Worried About Charity fundraiser in the Last Year:   . Arboriculturist in the Last Year:   Transportation Needs:   . Film/video editor (Medical):   [name] Lack of Transportation (Non-Medical):   Physical Activity:   . Days of Exercise per Week:   . Minutes of Exercise per Session:   Stress:   . Feeling of Stress :   Social Connections:   . Frequency of Communication with Friends and Family:   . Frequency of Social Gatherings with Friends and Family:   . Attends  Religious Services:   . Active Member of Clubs or Organizations:   . Attends Archivist Meetings:   [name] Marital Status:   Intimate Partner Violence:   . Fear of Current or Ex-Partner:   . Emotionally Abused:   [name] Physically Abused:   . Sexually Abused:     FAMILY HISTORY:  Family History  Problem Relation Age of Onset  . Diabetes Mother   . Cirrhosis Father   . COPD Sister     CURRENT MEDICATIONS:  Current Outpatient Medications  Medication Sig Dispense Refill  . aspirin EC 81 MG tablet Take 1 tablet (81 mg total) by mouth daily. 90 tablet 3  . atorvastatin (LIPITOR) 40 MG tablet Take 1 tablet (40 mg total) by mouth daily. 90 tablet 1  . busPIRone (BUSPAR) 10 MG tablet Take 1 tablet (10 mg total) by mouth 2 (two) times daily. 60 tablet 2  . cholecalciferol (VITAMIN D) 1000 units tablet Take 1 tablet (1,000 Units total) by mouth daily. 90 tablet 3  . clopidogrel (PLAVIX) 75 MG tablet Take 1 tablet by mouth once daily 90 tablet 0  . escitalopram (LEXAPRO) 10 MG tablet Take 1 tablet (10 mg total) by mouth daily. 30 tablet 1  . fluticasone (FLONASE) 50 MCG/ACT nasal spray Use 2 spray(s) in each nostril once daily 16 g 0  . furosemide (LASIX) 40 MG tablet Take 1 tablet (40 mg total) by mouth 2 (two) times daily. Take 19m in AM and 264min PM. 90 tablet 0  . metoprolol succinate (TOPROL-XL) 50 MG 24 hr tablet TAKE 1 TABLET BY MOUTH ONCE DAILY WITH  OR  IMMEDIATELY  FOLLOWING  A  MEAL 90 tablet 0  . montelukast (SINGULAIR) 10 MG tablet Take 1 tablet (10 mg total) by mouth at bedtime. 90 tablet 0  . pantoprazole (PROTONIX) 40 MG tablet Take 1 tablet (40 mg total) by mouth daily before breakfast. 30 tablet 2  . trolamine salicylate (ASPERCREME) 10 % cream Apply 1 application topically daily as needed for muscle pain (for pain in feet).     . Marland Kitchenidocaine-prilocaine (EMLA) cream Apply a small amount to port a cath site and cover with plastic wrap 1 hour prior to chemotherapy appointments  (Patient not taking: Reported on 03/28/2020) 30 g 3  . nitroGLYCERIN (NITROSTAT) 0.4 MG SL tablet Place 1 tablet (0.4 mg total) under the tongue  every 5 (five) minutes as needed for chest pain. (Patient not taking: Reported on 03/28/2020) 25 tablet 0  . prochlorperazine (COMPAZINE) 10 MG tablet Take 1 tablet (10 mg total) by mouth every 6 (six) hours as needed (Nausea or vomiting). 60 tablet 3   No current facility-administered medications for this visit.    ALLERGIES:  No Known Allergies  PHYSICAL EXAM:  Performance status (ECOG): 1 - Symptomatic but completely ambulatory  Vitals:   03/28/20 0826  BP: 125/77  Pulse: 81  Resp: 18  Temp: 97.7 F (36.5 C)  SpO2: 100%   Wt Readings from Last 3 Encounters:  03/28/20 56.6 kg (124 lb 11.2 oz)  03/27/20 56.8 kg (125 lb 4.8 oz)  03/21/20 58.1 kg (128 lb)   Physical Exam Vitals reviewed.  Constitutional:      Appearance: Normal appearance.  Cardiovascular:     Rate and Rhythm: Normal rate and regular rhythm.     Pulses: Normal pulses.     Heart sounds: Normal heart sounds.  Pulmonary:     Effort: Pulmonary effort is normal.     Breath sounds: Normal breath sounds.  Musculoskeletal:     Cervical back: Normal range of motion and neck supple.     Right lower leg: Edema (2+) present.     Left lower leg: No edema.     Comments: Bilat nephrostomy tubes  Neurological:     General: No focal deficit present.     Mental Status: He is alert and oriented to person, place, and time.  Psychiatric:        Mood and Affect: Mood normal.        Behavior: Behavior normal.     LABORATORY DATA:  I have reviewed the labs as listed.  CBC Latest Ref Rng & Units 03/28/2020 03/19/2020 03/11/2020  WBC 4.0 - 10.5 K/uL 5.5 5.0 4.7  Hemoglobin 13.0 - 17.0 g/dL 9.2(L) 9.2(L) 10.0(L)  Hematocrit 39 - 52 % 29.2(L) 29.8(L) 31.6(L)  Platelets 150 - 400 K/uL 228 243 251   CMP Latest Ref Rng & Units 03/19/2020 03/11/2020 03/10/2020  Glucose 65 - 139 mg/dL 88  135(H) 91  BUN 7 - 25 mg/dL _0 Creatinine 0.70 - 1.25 mg/dL 1.06 1.06 1.15  Sodium 135 - 146 mmol/L 128(L) 124(L) 125(L)  Potassium 3.5 - 5.3 mmol/L 3.9 3.9 4.2  Chloride 98 - 110 mmol/L 92(L) 88(L) 85(L)  CO2 20 - 32 mmol/L _1 Calcium 8.6 - 10.3 mg/dL 8.2(L) 8.4(L) 8.7(L)  Total Protein 6.1 - 8.1 g/dL 6.8 7.5 -  Total Bilirubin 0.2 - 1.2 mg/dL 0.8 0.6 -  Alkaline Phos 38 - 126 U/L - 165(H) -  AST 10 - 35 U/L 19 73(H) -  ALT 9 - 46 U/L 14 52(H) -    DIAGNOSTIC IMAGING:  I have independently reviewed the scans and discussed with the patient. CT ABDOMEN PELVIS W CONTRAST  Result Date: 03/06/2020 CLINICAL DATA:  Fever and leukocytosis. Metastatic urothelial cancer. Recent initiation of carboplatin and gemcitabine therapy on 03/04/2020. Back pain and fever. EXAM: CT ABDOMEN AND PELVIS WITH CONTRAST TECHNIQUE: Multidetector CT imaging of the abdomen and pelvis was performed using the standard protocol following bolus administration of intravenous contrast. CONTRAST:  132m OMNIPAQUE IOHEXOL 300 MG/ML  SOLN COMPARISON:  02/02/2020 FINDINGS: Lower chest: Moderate cardiomegaly with right coronary artery atherosclerosis. Prosthetic aortic valve. Mild scarring or atelectasis in the posterior basal segment right lower lobe. Hepatobiliary: Embolization coils along the porta hepatis.  No discrete hepatic mass is identified. Gallbladder unremarkable. Pancreas: Unremarkable Spleen: Lobular spleen, unchanged from prior. Adrenals/Urinary Tract: Adrenal glands unremarkable. Complex lesion along the right kidney upper pole measuring 2.3 by 1.5 cm, internal density approximately 70 Hounsfield units, probably a complex cyst and not appreciably changed from 02/02/2020. Prior PET-CT negative in this vicinity suggesting likely benign nature. Scarring in the right mid kidney. Scarring in the left kidney upper pole. Bilateral percutaneous nephrostomy tubes with coils formed in the renal pelvis bilaterally. No  significant hydronephrosis. There is a large amount of tumor eccentric to the right in the urinary bladder. I cannot exclude invasion of the right seminal vesicle by tumor based on the current appearance. Dominant tumor in the right bladder measures about 8.4 by 4.5 cm on image 56/2. There is likely some tumor along the bladder base as well. Perivesical nodularity including a 1.0 cm soft tissue nodule adjacent to the right bladder tumor on image 54/5. There loops of bowel adjacent to the bladder tumor, without definite invasion of bowel by the tumor. Stomach/Bowel: Prominent stool throughout the colon favors constipation. No dilated small bowel. Vascular/Lymphatic: Aortoiliac atherosclerotic vascular disease. Left periaortic node 1.1 cm in short axis on image 21/2, previously 1.4 cm. Right external iliac node partially encasing the right external iliac artery, 3.7 cm in short axis on image 50/2, previously 3.0 cm. Left pelvic sidewall lymph node 1.1 cm in short axis on image 56/2, previously the same. Other bilateral external iliac lymph nodes are mildly enlarged and there are smaller inguinal lymph nodes noted. Reproductive: Prostatomegaly. As noted above, I cannot exclude bladder tumor invading back into the right seminal vesicle given the appearance for example on image 52/6. Other: Subcutaneous edema along the anterior abdominal wall extending down into the scrotum and penis which appear expanded by infiltrative edema. This edema also extends into the upper thigh region, right greater than left. Musculoskeletal: Scattered osseous metastatic disease including visualized ribs, lumbar spine, bony pelvis, and inter trochanteric region of the right proximal femur. The sclerotic metastatic lesions are increased in size and number. An index lesion in the left inferior pubic ramus measures 2.0 by 1.1 cm, formerly 0.7 by 0.7 cm. Grade 1 degenerative anterolisthesis at L4-5 with bilateral resulting foraminal stenosis due  to disc uncovering and facet arthropathy. Facet arthropathy also contributes to bilateral foraminal impingement at L5-S1. There is endplate sclerosis at T0-5 along with loss of disc height which is stable from the prior exam. There is central narrowing of the thecal sac at the L4-5 level not appreciably changed from 02/02/2020. I do not see compelling evidence of enhancing tumor in the epidural space although MRI is of course more sensitive. I not see a definite pathologic fracture. IMPRESSION: 1. Large amount of tumor eccentric to the right in the urinary bladder with some adjacent nodularity. I cannot exclude invasion of the right seminal vesicle by tumor. Mild retroperitoneal and bilateral external iliac adenopathy. Moderately worsened osseous metastatic disease. 2. Subcutaneous edema along the anterior abdominal wall extending down into the scrotum and penis which appear expanded by infiltrative edema. This also extends into the upper thigh region, right greater than left. It is quite possible that the large right external iliac node measuring 3.7 cm in short axis is causing extrinsic compression on the right external iliac vein which may be contributing to the asymmetry of subcutaneous edema in the right upper thigh. 3. Other imaging findings of potential clinical significance: Moderate cardiomegaly with right coronary artery atherosclerosis. Bilateral  percutaneous nephrostomy tubes without significant hydronephrosis. Prominent stool throughout the colon favors constipation. Complex lesion along the right kidney upper pole, probably a complex cyst given lack of PET activity, and not appreciably changed from 02/02/2020. Lumbar spondylosis and degenerative disc disease causing impingement at L4-5 and L5-S1. Central narrowing of the thecal sac at L4-5 due to spondylosis and degenerative disc disease. 4. Aortic atherosclerosis. Aortic Atherosclerosis (ICD10-I70.0). Electronically Signed   By: Van Clines  M.D.   On: 03/06/2020 20:48   DG Chest Port 1 View  Result Date: 03/06/2020 CLINICAL DATA:  Shortness of breath EXAM: PORTABLE CHEST 1 VIEW COMPARISON:  02/01/2020 FINDINGS: Interval placement of right internal jugular approach Port-A-Cath with distal tip terminating at the level of the distal SVC. Prior aortic valve replacement and ascending thoracic aorta stent graft. Median sternotomy. Stable cardiomegaly. Atherosclerotic calcification of the aortic knob. No focal airspace consolidation, pleural effusion, or pneumothorax. IMPRESSION: Interval placement of right chest port. Otherwise stable exam from prior. No acute cardiopulmonary abnormality. No pneumothorax. Electronically Signed   By: Davina Poke D.O.   On: 03/06/2020 16:54     ASSESSMENT:  1. Metastatic urothelial carcinoma: -Left supraclavicular lymph node biopsy on 11/21/2019 showed metastatic urothelial carcinoma. -PET scan on 10/30/2019 showed right bladder mass with right hydroureteronephrosis.  Pelvic, retroperitoneal and left supraclavicular adenopathy. -CT CAP on 02/02/2020 shows progression of bladder wall thickening, increased retroperitoneal and pelvic adenopathy.  Interval development of multiple faint foci of sclerosis involving ribs, spine, pelvic bones consistent with bone meta stasis.  2.7 cm exophytic right kidney mass but PET negative.  Small amount of ascites. -PD-L1 CPS 1%.  Foundation 1 with TMB more than 10 Muts/Mb, MS-stable. -Cycle 1 day 1 of gemcitabine and carboplatin on 03/04/2020.  2.  Bone metastasis: -Recent CT showed multiple foci of sclerosis involving ribs, spine, pelvic bones consistent with metastatic disease. -We will consider denosumab after first cycle.   PLAN:  1. Metastatic urothelial carcinoma: -He had a hospitalization with urosepsis after cycle 1.  Completed outpatient antibiotics about 7 days ago. -He is doing fairly well.  I reviewed his labs.  He will proceed with cycle 2-day 1 today.   Gemcitabine will be dose reduced to 800 mg per metered square.  Carboplatin at flat dose 400 mg. -We will reevaluate him in 1 week.  2. Right dorsum and leg swelling: -Right foot dorsum and scrotal swelling likely from pelvic lymphadenopathy.  Will likely respond to treatments.  3. Bone metastasis: -We will consider Xgeva with next cycle.  4.  Hydronephrosis: -Bilateral percutaneous nephrostomy tubes on 02/22/2020. -Creatinine has normalized.  5.  Microcytic anemia: -Hemoglobin today is 9.2 with MCV of 76.8. -We will receive Feraheme first dose today.   Orders placed this encounter:  No orders of the defined types were placed in this encounter.    Derek Jack, MD [place] [phone]   I, Milinda Antis, am acting as a scribe for Dr. Sanda Linger.  I, Derek Jack MD, have reviewed the above documentation for accuracy and completeness, and I agree with the above.

## 2020-03-28 NOTE — Progress Notes (Signed)
Patient has been assessed, vital signs and labs have been reviewed by Dr. Delton Coombes. ANC, Creatinine, LFTs, and Platelets are within treatment parameters per Dr. Delton Coombes. The patient is good to proceed with treatment at this time. Please give an additional 1 liter of fluids with electrolytes over 2 hours per Dr. Delton Coombes.

## 2020-03-28 NOTE — Patient Instructions (Signed)
[facility] Discharge Instructions  Labs drawn from portacath today   Thank you for choosing [facility] to provide your oncology and hematology care.  To afford each patient quality time with our provider, please arrive at least 15 minutes before your scheduled appointment time.   If you have a lab appointment with the Cancer Center please come in thru the Main Entrance and check in at the main information desk.  You need to re-schedule your appointment should you arrive 10 or more minutes late.  We strive to give you quality time with our providers, and arriving late affects you and other patients whose appointments are after yours.  Also, if you no show three or more times for appointments you may be dismissed from the clinic at the providers discretion.     Again, thank you for choosing [facility].  Our hope is that these requests will decrease the amount of time that you wait before being seen by our physicians.       _____________________________________________________________  Should you have questions after your visit to [facility], please contact our office at [phone] between the hours of 8:00 a.m. and 4:30 p.m.  Voicemails left after 4:00 p.m. will not be returned until the following business day.  For prescription refill requests, have your pharmacy contact our office and allow 72 hours.    Due to Covid, you will need to wear a mask upon entering the hospital. If you do not have a mask, a mask will be given to you at the Main Entrance upon arrival. For doctor visits, patients may have 1 support person with them. For treatment visits, patients can not have anyone with them due to social distancing guidelines and our immunocompromised population.     

## 2020-03-28 NOTE — Progress Notes (Signed)
0953 Labs reviewed with and pt seen by Dr. Delton Coombes and pt approved for Carboplatin and Gemzar infusions today with Feraheme and 1 liter of IV hydration with potassium and magnesium over 2 hours ordered today per MD                             [name] tolerated chemo treatment,Feraheme infusion and IV hydration well without complaints or incident. VSS upon discharge. Pt discharged via wheelchair in satisfactory condition

## 2020-03-28 NOTE — Patient Instructions (Signed)
[place] at [facility] Discharge Instructions  You were seen today by Dr. Delton Coombes. He went over your recent results. You received treatment and an iron infusion today. You will have your next treatment next week. Take the nausea medication twice to three times daily for the next 2-3 days. Dr. Delton Coombes will see you back in 1 week for labs and follow up.   Thank you for choosing [place] at [facility] to provide your oncology and hematology care.  To afford each patient quality time with our provider, please arrive at least 15 minutes before your scheduled appointment time.   If you have a lab appointment with the [place] please come in thru the Main Entrance and check in at the main information desk  You need to re-schedule your appointment should you arrive 10 or more minutes late.  We strive to give you quality time with our providers, and arriving late affects you and other patients whose appointments are after yours.  Also, if you no show three or more times for appointments you may be dismissed from the clinic at the providers discretion.     Again, thank you for choosing [facility].  Our hope is that these requests will decrease the amount of time that you wait before being seen by our physicians.       _____________________________________________________________  Should you have questions after your visit to [facility], please contact our office at [phone] between the hours of 8:00 a.m. and 4:30 p.m.  Voicemails left after 4:00 p.m. will not be returned until the following business day.  For prescription refill requests, have your pharmacy contact our office and allow 72 hours.    Cancer Center Support Programs:   > Cancer Support Group  2nd Tuesday of the month 1pm-2pm, Journey Room

## 2020-03-28 NOTE — Patient Instructions (Signed)
[facility] Discharge Instructions for Patients Receiving Chemotherapy   Beginning January 23rd 2017 lab work for the [facility] will be done in the  Main lab at [facility] on 1st floor. If you have a lab appointment with the [place] please come in thru the  Main Entrance and check in at the main information desk   Today you received the following chemotherapy agents Carboplatin and Gemzar as well as Feraheme and IV hydration with potassium and magnesium. Follow-up as scheduled  To help prevent nausea and vomiting after your treatment, we encourage you to take your nausea medication   If you develop nausea and vomiting, or diarrhea that is not controlled by your medication, call the clinic.  The clinic phone number is [phone]. Office hours are Monday-Friday 8:30am-5:00pm.  BELOW ARE SYMPTOMS THAT SHOULD BE REPORTED IMMEDIATELY:  *FEVER GREATER THAN 101.0 F  *CHILLS WITH OR WITHOUT FEVER  NAUSEA AND VOMITING THAT IS NOT CONTROLLED WITH YOUR NAUSEA MEDICATION  *UNUSUAL SHORTNESS OF BREATH  *UNUSUAL BRUISING OR BLEEDING  TENDERNESS IN MOUTH AND THROAT WITH OR WITHOUT PRESENCE OF ULCERS  *URINARY PROBLEMS  *BOWEL PROBLEMS  UNUSUAL RASH Items with * indicate a potential emergency and should be followed up as soon as possible. If you have an emergency after office hours please contact your primary care physician or go to the nearest emergency department.  Please call the clinic during office hours if you have any questions or concerns.   You may also contact the Patient Navigator at [phone] should you have any questions or need assistance in obtaining follow up care.      Resources For Cancer Patients and their Caregivers ? American Cancer Society: Can assist with transportation, wigs, general needs, runs Look Good Feel Better.        (361) 609-9567 ? Cancer Care: Provides financial assistance, online support groups, medication/co-pay  assistance.  1-800-813-HOPE 785 301 1512) ? [place] Assists [place] cancer patients and their families through emotional , educational and financial support.  3154707806 ? [place] DSS Where to apply for food stamps, Medicaid and utility assistance. (806)603-0938 ? [facility]: Transportation to medical appointments. (772) 022-1984 ? Social Security Administration: May apply for disability if have a Stage IV cancer. (508)776-9050 (559) 591-5772 ? [employer], Disability and Transit Services: Assists with nutrition, care and transit needs. (812)546-1718

## 2020-03-29 DIAGNOSIS — C678 Malignant neoplasm of overlapping sites of bladder: Secondary | ICD-10-CM | POA: Insufficient documentation

## 2020-04-01 ENCOUNTER — Encounter: Payer: Self-pay | Admitting: Urology

## 2020-04-01 ENCOUNTER — Other Ambulatory Visit: Payer: Self-pay

## 2020-04-01 ENCOUNTER — Ambulatory Visit (INDEPENDENT_AMBULATORY_CARE_PROVIDER_SITE_OTHER): Payer: Medicare Other | Admitting: Urology

## 2020-04-01 VITALS — BP 124/64 | HR 78 | Temp 97.7°F | Ht 64.0 in | Wt 125.7 lb

## 2020-04-01 DIAGNOSIS — C678 Malignant neoplasm of overlapping sites of bladder: Secondary | ICD-10-CM | POA: Diagnosis not present

## 2020-04-01 DIAGNOSIS — N1330 Unspecified hydronephrosis: Secondary | ICD-10-CM | POA: Diagnosis not present

## 2020-04-01 NOTE — Progress Notes (Signed)
Urological Symptom Review  Patient is experiencing the following symptoms: Burning/pain with urination Have to strain to urinate Weak stream   Review of Systems  Gastrointestinal (upper)  : Negative for upper GI symptoms  Gastrointestinal (lower) : Negative for lower GI symptoms  Constitutional : Negative for symptoms  Skin: Negative for skin symptoms  Eyes: Negative for eye symptoms  Ear/Nose/Throat : Negative for Ear/Nose/Throat symptoms  Hematologic/Lymphatic: Negative for Hematologic/Lymphatic symptoms  Cardiovascular : Leg swelling  Respiratory : Shortness of breath  Endocrine: Negative for endocrine symptoms  Musculoskeletal: Back pain Joint pain  Neurological: Negative for neurological symptoms  Psychologic: Negative for psychiatric symptoms

## 2020-04-01 NOTE — Patient Instructions (Signed)
Bladder Cancer  Bladder cancer is an abnormal growth of tissue in the bladder. The bladder is the balloon-like sac in the pelvis. It collects and stores urine that comes from the kidneys through the ureters. The bladder wall is made of layers. If cancer spreads into these layers and through the wall of the bladder, it becomes more difficult to treat. What are the causes? The cause of this condition is not known. What increases the risk? The following factors may make you more likely to develop this condition:  Smoking.  Workplace risks (occupational exposures), such as rubber, leather, textile, dyes, chemicals, and paint.  Being white.  Your age. Most people with bladder cancer are over the age of 55.  Being male.  Having chronic bladder inflammation.  Having a personal history of bladder cancer.  Having a family history of bladder cancer (heredity).  Having had chemotherapy or radiation therapy to the pelvis.  Having been exposed to arsenic. What are the signs or symptoms? Initial symptoms of this condition include:  Blood in the urine.  Painful urination.  Frequent bladder or urine infections.  Increase in urgency and frequency of urination. Advanced symptoms of this condition include:  Not being able to urinate.  Low back pain on one side.  Loss of appetite.  Weight loss.  Fatigue.  Swelling in the feet.  Bone pain. How is this diagnosed? This condition is diagnosed based on your medical history, a physical exam, urine tests, lab tests, imaging tests, and your symptoms. You may also have other tests or procedures done, such as:  A narrow tube being inserted into your bladder through your urethra (cystoscopy) in order to view the lining of your bladder for tumors.  A biopsy to sample the tumor to see if cancer is present. If cancer is present, it will then be staged to determine its severity and extent. Staging is an assessment of:  The size of the  tumor.  Whether the cancer has spread.  Where the cancer has spread. It is important to know how deeply into the bladder wall cancer has grown and whether cancer has spread to any other parts of your body. Staging may require blood tests or imaging tests, such as a CT scan, MRI, bone scan, or chest X-ray. How is this treated? Based on the stage of cancer, one treatment or a combination of treatments may be recommended. The most common forms of treatment are:  Surgery to remove the cancer. Procedures that may be done include transurethral resection and cystectomy.  Radiation therapy. This is high-energy X-rays or other particles. This is often used in combination with chemotherapy.  Chemotherapy. During this treatment, medicines are used to kill cancer cells.  Immunotherapy. This uses medicines to help your own immune system destroy cancer cells. Follow these instructions at home:  Take over-the-counter and prescription medicines only as told by your health care provider.  Maintain a healthy diet. Some of your treatments might affect your appetite.  Consider joining a support group. This may help you learn to cope with the stress of having bladder cancer.  Tell your cancer care team if you develop side effects. They may be able to recommend ways to relieve them.  Keep all follow-up visits as told by your health care provider. This is important. Where to find more information  American Cancer Society: www.cancer.org  National Cancer Institute (NCI): www.cancer.gov Contact a health care provider if:  You have symptoms of a urinary tract infection. These include: ?   Fever. ? Chills. ? Weakness. ? Muscle aches. ? Abdominal pain. ? Frequent and intense urge to urinate. ? Burning feeling in the bladder or urethra during urination. Get help right away if:  There is blood in your urine.  You cannot urinate.  You have severe pain or other symptoms that do not go  away. Summary  Bladder cancer is an abnormal growth of tissue in the bladder.  This condition is diagnosed based on your medical history, a physical exam, urine tests, lab tests, imaging tests, and your symptoms.  Based on the stage of cancer, surgery, chemotherapy, or a combination of treatments may be recommended.  Consider joining a support group. This may help you learn to cope with the stress of having bladder cancer. This information is not intended to replace advice given to you by your health care provider. Make sure you discuss any questions you have with your health care provider. Document Revised: 08/20/2017 Document Reviewed: 08/11/2016 Elsevier Patient Education  2020 Elsevier Inc.  

## 2020-04-01 NOTE — Progress Notes (Signed)
04/01/2020 10:33 AM   [name] 1951/07/12 [id number]  Referring provider: [name], MD [address] [place],  [place] [address]  Metastatic bladder cancer followup  HPI: [name] is a 38TG here for followup for metastatic bladder cancer, bilateral hydronephrosis. He is [facility] followup at [facility] and has undergone 2nd cycle on gemcitabine/carboplatin on 7/8. He has bilateral nephrostomy tubes in place draining well. Creatinine 0.8. No hematuria. He has scrotal swelling which is improved with the lasix.    PMH: Past Medical History:  Diagnosis Date  . Arterial embolus of lower extremity ([place]) 11/23/2013  . Arthritis   . CAD (coronary artery disease)    a. s/p PCI with 3.5 x 18 BMS to mid RCA, non obst LAD, Lcx patent 08/10/13  . Cancer ([place]) 2021   bladder  . Depression, major, single episode, mild ([place]) 12/20/2019  . ED (erectile dysfunction) 03/27/2015  . Embolus of femoral artery [facility]) 2012   Surgical repair, previously on Coumadin  . Essential hypertension   . ETOH abuse   . Hematuria 10/17/2019  . Hernia of abdominal cavity 02/27/2019  . History of DVT (deep vein thrombosis) 2011  . History of GI bleed    February 2012   . Ingrown left greater toenail 09/04/2018  . 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 Low oxygen saturation 01/31/2020  . 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 Pain with urination 10/17/2019  . Port-A-Cath in place 02/23/2020  . Prosthetic valve endocarditis ([place])   . PVD (peripheral vascular disease) ([place])   . S/P aortic valve replacement    Details not clear -  reportedly 1971 per patient    Surgical History: Past Surgical History:  Procedure Laterality Date  . ABDOMINAL AORTOGRAM W/LOWER EXTREMITY N/A 10/31/2018   Procedure: ABDOMINAL AORTOGRAM W/LOWER EXTREMITY;  Surgeon: Lorretta Harp, MD;  Location: [place] CV LAB;  Service:  Cardiovascular;  Laterality: N/A;  . Aoric valve replacement  January 2005   76mm Medtronic Freestyle stentless porcine bioprosthesis [facility])  . CARDIAC CATHETERIZATION     30-40% mid LAD, 20-30% ostial D1, proximal 30-40% RCA, 95% mid RCA thrombus, 40% distal stenosis. EF 45%, inferior HK.  [name] COLONOSCOPY N/A 04/20/2014   Procedure: COLONOSCOPY;  Surgeon: Rogene Houston, MD;  [facility];  Service: Endoscopy;  Laterality: N/A;  930  . COLONOSCOPY WITH PROPOFOL N/A 02/02/2020   Procedure: COLONOSCOPY WITH PROPOFOL;  Surgeon: Rogene Houston, MD;  [facility];  Service: Endoscopy;  Laterality: N/A;  . CORONARY ANGIOPLASTY WITH STENT PLACEMENT  07/2013  . ESOPHAGOGASTRODUODENOSCOPY (EGD) WITH PROPOFOL N/A 02/02/2020   Procedure: ESOPHAGOGASTRODUODENOSCOPY (EGD) WITH PROPOFOL;  Surgeon: Rogene Houston, MD;  [facility];  Service: Endoscopy;  Laterality: N/A;  . INGUINAL HERNIA REPAIR Bilateral 04/17/2019   Procedure: ATTEMPTED LAPAROSCOPIC BILATERAL INGUINAL HERNIA REPAIR;  Surgeon: Coralie Keens, MD;  Location: [place];  Service: General;  Laterality: Bilateral;  . INGUINAL HERNIA REPAIR Bilateral 04/17/2019   Procedure: Hernia Repair Inguinal Adult;  Surgeon: Coralie Keens, MD;  Location: [place];  Service: General;  Laterality: Bilateral;  . IR IMAGING GUIDED PORT INSERTION  02/22/2020  . IR NEPHROSTOMY PLACEMENT LEFT  02/22/2020  . IR NEPHROSTOMY PLACEMENT RIGHT  02/22/2020  . LEFT HEART CATHETERIZATION WITH CORONARY ANGIOGRAM N/A 08/10/2013   Procedure: LEFT HEART CATHETERIZATION WITH CORONARY  Cyril Loosen;  Surgeon: Blane Ohara, MD;  Location: [facility] CATH LAB;  Service: Cardiovascular;  Laterality: N/A;  . Left leg embolectomy  May 2012   Dr. Trula Slade - reason for lifelong coumadin  . LYMPH NODE BIOPSY Left 11/21/2019   Procedure: Left Neck LYMPH NODE BIOPSY;  Surgeon: Rozetta Nunnery, MD;  Location: [place];  Service: ENT;  Laterality: Left;  . PERIPHERAL  VASCULAR ATHERECTOMY  10/31/2018   Procedure: PERIPHERAL VASCULAR ATHERECTOMY;  Surgeon: Lorretta Harp, MD;  Location: [place] CV LAB;  Service: Cardiovascular;;  left SFA  . POLYPECTOMY  02/02/2020   Procedure: POLYPECTOMY;  Surgeon: Rogene Houston, MD;  [facility];  Service: Endoscopy;;  splenic flexure    Home Medications:  Allergies as of 04/01/2020   No Known Allergies     Medication List       Accurate as of April 01, 2020 10:33 AM. If you have any questions, ask your nurse or doctor.        aspirin EC 81 MG tablet Take 1 tablet (81 mg total) by mouth daily.   atorvastatin 40 MG tablet Commonly known as: LIPITOR Take 1 tablet (40 mg total) by mouth daily.   busPIRone 10 MG tablet Commonly known as: BUSPAR Take 1 tablet (10 mg total) by mouth 2 (two) times daily.   cholecalciferol 25 MCG (1000 UNIT) tablet Commonly known as: VITAMIN D Take 1 tablet (1,000 Units total) by mouth daily.   clopidogrel 75 MG tablet Commonly known as: PLAVIX Take 1 tablet by mouth once daily   escitalopram 10 MG tablet Commonly known as: LEXAPRO Take 1 tablet (10 mg total) by mouth daily.   fluticasone 50 MCG/ACT nasal spray Commonly known as: FLONASE Use 2 spray(s) in each nostril once daily   furosemide 40 MG tablet Commonly known as: LASIX Take 1 tablet (40 mg total) by mouth 2 (two) times daily. Take 40mg  in AM and 20mg  in PM.   lidocaine-prilocaine cream Commonly known as: EMLA Apply a small amount to port a cath site and cover with plastic wrap 1 hour prior to chemotherapy appointments   metoprolol succinate 50 MG 24 hr tablet Commonly known as: TOPROL-XL TAKE 1 TABLET BY MOUTH ONCE DAILY WITH&nbsp;&nbsp;OR&nbsp;&nbsp;IMMEDIATELY&nbsp;&nbsp;FOLLOWING&nbsp;&nbsp;A&nbsp;&nbsp;MEAL   montelukast 10 MG tablet Commonly known as: SINGULAIR Take 1 tablet (10 mg total) by mouth at bedtime.   nitroGLYCERIN 0.4 MG SL tablet Commonly known as:  NITROSTAT Place 1 tablet (0.4 mg total) under the tongue every 5 (five) minutes as needed for chest pain.   pantoprazole 40 MG tablet Commonly known as: PROTONIX Take 1 tablet (40 mg total) by mouth daily before breakfast.   prochlorperazine 10 MG tablet Commonly known as: COMPAZINE Take 1 tablet (10 mg total) by mouth every 6 (six) hours as needed (Nausea or vomiting).   trolamine salicylate 10 % cream Commonly known as: ASPERCREME Apply 1 application topically daily as needed for muscle pain (for pain in feet).       Allergies: No Known Allergies  Family History: Family History  Problem Relation Age of Onset  . Diabetes Mother   . Cirrhosis Father   . COPD Sister     Social History:  reports that he quit smoking about 50 years ago. His smoking use included cigarettes. He started smoking about 54 years ago. He has a 1.50 pack-year smoking history. He has never used smokeless tobacco. He reports previous alcohol use. He reports current drug use. Drug: Marijuana.  ROS:  All other review of systems were reviewed and are negative except what is noted above in HPI  Physical Exam: BP 124/64   Pulse 78   Temp 97.7 F (36.5 C)   Ht 5\' 4"  (1.626 m)   Wt 125 lb 11.2 oz (57 kg)   BMI 21.58 kg/m   Constitutional:  Alert and oriented, No acute distress. HEENT: [place] AT, moist mucus membranes.  Trachea midline, no masses. Cardiovascular: No clubbing, cyanosis, or edema. Respiratory: Normal respiratory effort, no increased work of breathing. GI: Abdomen is soft, nontender, nondistended, no abdominal masses GU: No CVA tenderness.  Lymph: No cervical or inguinal lymphadenopathy. Skin: No rashes, bruises or suspicious lesions. Neurologic: Grossly intact, no focal deficits, moving all 4 extremities. Psychiatric: Normal mood and affect.  Laboratory Data: Lab Results  Component Value Date   WBC 5.5 03/28/2020   HGB 9.2 (L) 03/28/2020   HCT 29.2 (L) 03/28/2020   MCV 76.8 (L)  03/28/2020   PLT 228 03/28/2020    Lab Results  Component Value Date   CREATININE 0.80 03/28/2020    Lab Results  Component Value Date   PSA 5.1 (H) 09/12/2019   PSA 3.8 09/01/2018   PSA 2.4 08/26/2017    No results found for: TESTOSTERONE  No results found for: HGBA1C  Urinalysis    Component Value Date/Time   COLORURINE YELLOW 03/06/2020 1628   APPEARANCEUR CLOUDY (A) 03/06/2020 1628   LABSPEC 1.014 03/06/2020 1628   PHURINE 6.0 03/06/2020 1628   GLUCOSEU NEGATIVE 03/06/2020 1628   GLUCOSEU 100 (A) 07/08/2010 1902   HGBUR LARGE (A) 03/06/2020 1628   HGBUR small 07/10/2010 1004   BILIRUBINUR NEGATIVE 03/06/2020 1628   BILIRUBINUR neg 02/21/2020 0920   KETONESUR NEGATIVE 03/06/2020 1628   PROTEINUR 100 (A) 03/06/2020 1628   UROBILINOGEN negative (A) 02/21/2020 0920   UROBILINOGEN 0.2 02/05/2011 1016   NITRITE POSITIVE (A) 03/06/2020 1628   LEUKOCYTESUR LARGE (A) 03/06/2020 1628    Lab Results  Component Value Date   BACTERIA FEW (A) 03/06/2020    Pertinent Imaging:  Results for orders placed during the hospital encounter of 10/17/19  DG Abd 1 View  Narrative CLINICAL DATA:  Hematuria and right-sided abdominal pain. History of kidney stones.  EXAM: ABDOMEN - 1 VIEW  COMPARISON:  Abdominal radiograph 11/02/2010. CT abdomen and pelvis 01/30/2011.  FINDINGS: No definite radiopaque renal calculi are identified although assessment of the left kidney is limited by overlying bowel. Two 3 mm calcifications projecting laterally in the right mid abdomen over the tip of the liver are lateral to the expected location of the right kidney based on the prior CT although the renal shadow is not well delineated on this radiograph. Small calcifications in the pelvis are favored to reflect a combination of phleboliths and atherosclerotic vascular calcification. Embolization coils are noted in the right upper quadrant. There is mild gaseous dilatation of multiple  small bowel loops, overall greater than on the prior radiograph. Gas and an ordinary amount of stool are present in the colon. No acute osseous abnormality is seen. Aortic valve replacement is noted.  IMPRESSION: 1. No definite radiopaque urinary tract calculi identified. 2. Mild gaseous dilatation of multiple small bowel loops, nonspecific though may reflect mild ileus.   Electronically Signed By: Logan Bores M.D. On: 10/17/2019 15:06  No results found for this or any previous visit.  No results found for this or any previous visit.  No results found for this or any previous visit.  No  results found for this or any previous visit.  No results found for this or any previous visit.  No results found for this or any previous visit.  No results found for this or any previous visit.   Assessment & Plan:   1. Bilatral hydronephrosis -The patient is scheduled for bilateral neph tube change on 7/16. He will continue nephrostomy tubes to maximize renal function  2. Metatatic urothelial cancer -Management per Dr. Delton Coombes  No follow-ups on file.  Nicolette Bang, MD  [facility] [place]

## 2020-04-04 ENCOUNTER — Inpatient Hospital Stay (HOSPITAL_BASED_OUTPATIENT_CLINIC_OR_DEPARTMENT_OTHER): Payer: Medicare Other | Admitting: Hematology

## 2020-04-04 ENCOUNTER — Other Ambulatory Visit: Payer: Self-pay

## 2020-04-04 ENCOUNTER — Inpatient Hospital Stay (HOSPITAL_COMMUNITY): Payer: Medicare Other

## 2020-04-04 VITALS — BP 139/82 | HR 80 | Temp 97.5°F | Resp 20

## 2020-04-04 VITALS — BP 132/73 | HR 79 | Temp 98.4°F | Resp 21 | Wt 120.8 lb

## 2020-04-04 DIAGNOSIS — M7989 Other specified soft tissue disorders: Secondary | ICD-10-CM | POA: Diagnosis not present

## 2020-04-04 DIAGNOSIS — C7951 Secondary malignant neoplasm of bone: Secondary | ICD-10-CM | POA: Diagnosis not present

## 2020-04-04 DIAGNOSIS — Z95828 Presence of other vascular implants and grafts: Secondary | ICD-10-CM

## 2020-04-04 DIAGNOSIS — N1330 Unspecified hydronephrosis: Secondary | ICD-10-CM | POA: Diagnosis not present

## 2020-04-04 DIAGNOSIS — I739 Peripheral vascular disease, unspecified: Secondary | ICD-10-CM | POA: Diagnosis not present

## 2020-04-04 DIAGNOSIS — Z86718 Personal history of other venous thrombosis and embolism: Secondary | ICD-10-CM | POA: Diagnosis not present

## 2020-04-04 DIAGNOSIS — Z5111 Encounter for antineoplastic chemotherapy: Secondary | ICD-10-CM | POA: Diagnosis not present

## 2020-04-04 DIAGNOSIS — R112 Nausea with vomiting, unspecified: Secondary | ICD-10-CM | POA: Diagnosis not present

## 2020-04-04 DIAGNOSIS — C7910 Secondary malignant neoplasm of unspecified urinary organs: Secondary | ICD-10-CM

## 2020-04-04 DIAGNOSIS — I2510 Atherosclerotic heart disease of native coronary artery without angina pectoris: Secondary | ICD-10-CM | POA: Diagnosis not present

## 2020-04-04 DIAGNOSIS — D509 Iron deficiency anemia, unspecified: Secondary | ICD-10-CM

## 2020-04-04 DIAGNOSIS — Z7982 Long term (current) use of aspirin: Secondary | ICD-10-CM | POA: Diagnosis not present

## 2020-04-04 DIAGNOSIS — C678 Malignant neoplasm of overlapping sites of bladder: Secondary | ICD-10-CM | POA: Diagnosis not present

## 2020-04-04 DIAGNOSIS — R600 Localized edema: Secondary | ICD-10-CM | POA: Diagnosis not present

## 2020-04-04 DIAGNOSIS — R0602 Shortness of breath: Secondary | ICD-10-CM | POA: Diagnosis not present

## 2020-04-04 DIAGNOSIS — Z7902 Long term (current) use of antithrombotics/antiplatelets: Secondary | ICD-10-CM | POA: Diagnosis not present

## 2020-04-04 DIAGNOSIS — C679 Malignant neoplasm of bladder, unspecified: Secondary | ICD-10-CM | POA: Diagnosis not present

## 2020-04-04 DIAGNOSIS — Z79899 Other long term (current) drug therapy: Secondary | ICD-10-CM | POA: Diagnosis not present

## 2020-04-04 DIAGNOSIS — Z87891 Personal history of nicotine dependence: Secondary | ICD-10-CM | POA: Diagnosis not present

## 2020-04-04 LAB — COMPREHENSIVE METABOLIC PANEL
ALT: 27 U/L (ref 0–44)
AST: 23 U/L (ref 15–41)
Albumin: 3.4 g/dL — ABNORMAL LOW (ref 3.5–5.0)
Alkaline Phosphatase: 311 U/L — ABNORMAL HIGH (ref 38–126)
Anion gap: 10 (ref 5–15)
BUN: 12 mg/dL (ref 8–23)
CO2: 24 mmol/L (ref 22–32)
Calcium: 7.8 mg/dL — ABNORMAL LOW (ref 8.9–10.3)
Chloride: 93 mmol/L — ABNORMAL LOW (ref 98–111)
Creatinine, Ser: 0.64 mg/dL (ref 0.61–1.24)
GFR calc Af Amer: >60 mL/min (ref >60)
GFR calc non Af Amer: >60 mL/min (ref >60)
Glucose, Bld: 112 mg/dL — ABNORMAL HIGH (ref 70–99)
Potassium: 3.5 mmol/L (ref 3.5–5.1)
Sodium: 127 mmol/L — ABNORMAL LOW (ref 135–145)
Total Bilirubin: 0.6 mg/dL (ref 0.3–1.2)
Total Protein: 7 g/dL (ref 6.5–8.1)

## 2020-04-04 LAB — CBC WITH DIFFERENTIAL/PLATELET
Abs Immature Granulocytes: 0.06 10*3/uL (ref 0.00–0.07)
Basophils Absolute: 0 10*3/uL (ref 0.0–0.1)
Basophils Relative: 0 %
Eosinophils Absolute: 0 10*3/uL (ref 0.0–0.5)
Eosinophils Relative: 0 %
HCT: 27.4 % — ABNORMAL LOW (ref 39.0–52.0)
Hemoglobin: 8.8 g/dL — ABNORMAL LOW (ref 13.0–17.0)
Immature Granulocytes: 1 %
Lymphocytes Relative: 14 %
Lymphs Abs: 0.8 10*3/uL (ref 0.7–4.0)
MCH: 24.5 pg — ABNORMAL LOW (ref 26.0–34.0)
MCHC: 32.1 g/dL (ref 30.0–36.0)
MCV: 76.3 fL — ABNORMAL LOW (ref 80.0–100.0)
Monocytes Absolute: 0.1 10*3/uL (ref 0.1–1.0)
Monocytes Relative: 2 %
Neutro Abs: 4.9 10*3/uL (ref 1.7–7.7)
Neutrophils Relative %: 83 %
Platelets: 125 10*3/uL — ABNORMAL LOW (ref 150–400)
RBC: 3.59 MIL/uL — ABNORMAL LOW (ref 4.22–5.81)
RDW: 22.9 % — ABNORMAL HIGH (ref 11.5–15.5)
WBC: 5.9 10*3/uL (ref 4.0–10.5)
nRBC: 0 % (ref 0.0–0.2)

## 2020-04-04 LAB — MAGNESIUM: Magnesium: 1.3 mg/dL — ABNORMAL LOW (ref 1.7–2.4)

## 2020-04-04 MED ORDER — HEPARIN SOD (PORK) LOCK FLUSH 100 UNIT/ML IV SOLN
500.0000 [IU] | Freq: Once | INTRAVENOUS | Status: AC | PRN
  Administered 2020-04-04: 500 [IU]

## 2020-04-04 MED ORDER — SODIUM CHLORIDE 0.9 % IV SOLN
510.0000 mg | Freq: Once | INTRAVENOUS | Status: AC
  Administered 2020-04-04: 510 mg via INTRAVENOUS
  Filled 2020-04-04: qty 510

## 2020-04-04 MED ORDER — SODIUM CHLORIDE 0.9 % IV SOLN: Freq: Once | INTRAVENOUS | Status: AC

## 2020-04-04 MED ORDER — LORAZEPAM 2 MG/ML IJ SOLN
1.0000 mg | Freq: Once | INTRAMUSCULAR | Status: AC
  Administered 2020-04-04: 1 mg via INTRAVENOUS
  Filled 2020-04-04: qty 1

## 2020-04-04 MED ORDER — SODIUM CHLORIDE 0.9 % IV SOLN
800.0000 mg/m2 | Freq: Once | INTRAVENOUS | Status: AC
  Administered 2020-04-04: 1254 mg via INTRAVENOUS
  Filled 2020-04-04: qty 26.3

## 2020-04-04 MED ORDER — SODIUM CHLORIDE 0.9% FLUSH
10.0000 mL | INTRAVENOUS | Status: DC | PRN
  Administered 2020-04-04: 10 mL

## 2020-04-04 MED ORDER — MAGNESIUM OXIDE 400 (241.3 MG) MG PO TABS: 400.0000 mg | ORAL_TABLET | Freq: Two times a day (BID) | ORAL | 2 refills | Status: AC

## 2020-04-04 MED ORDER — SODIUM CHLORIDE 0.9 % IV SOLN
Freq: Once | INTRAVENOUS | Status: AC
  Filled 2020-04-04: qty 1000

## 2020-04-04 MED ORDER — PROCHLORPERAZINE MALEATE 10 MG PO TABS
10.0000 mg | ORAL_TABLET | Freq: Once | ORAL | Status: AC
  Administered 2020-04-04: 10 mg via ORAL
  Filled 2020-04-04: qty 1

## 2020-04-04 NOTE — Progress Notes (Signed)
[name], [place] [address]   CLINIC:  Medical Oncology/Hematology  PCP:  [name], MD [address] [place] / [place] [address] [phone]   REASON FOR VISIT:  Follow-up for metastatic bladder cancer  PRIOR THERAPY: None  NGS Results: PD-L1 CPS 1%  CURRENT THERAPY: Gemcitabine & carboplatin  BRIEF ONCOLOGIC HISTORY:  Oncology History  Metastatic urothelial carcinoma ([place])  02/13/2020 Initial Diagnosis   Metastatic urothelial carcinoma ([place])   02/23/2020 Genetic Testing   PD-L1      02/29/2020 Genetic Testing   Foundation One     03/04/2020 -  Chemotherapy   The patient had palonosetron (ALOXI) injection 0.25 mg, 0.25 mg, Intravenous,  Once, 2 of 4 cycles Administration: 0.25 mg (03/04/2020), 0.25 mg (03/28/2020) CARBOplatin (PARAPLATIN) 400 mg in sodium chloride 0.9 % 250 mL chemo infusion, 400 mg (100 % of original dose 397 mg), Intravenous,  Once, 2 of 4 cycles Dose modification:   (original dose 397 mg, Cycle 1), 397 mg (original dose 397 mg, Cycle 2) Administration: 400 mg (03/04/2020), 400 mg (03/28/2020) gemcitabine (GEMZAR) 1,558 mg in sodium chloride 0.9 % 250 mL chemo infusion, 1,000 mg/m2 = 1,558 mg, Intravenous,  Once, 2 of 4 cycles Dose modification: 800 mg/m2 (original dose 1,000 mg/m2, Cycle 2, Reason: Provider Judgment) Administration: 1,558 mg (03/04/2020), 1,254 mg (03/28/2020) fosaprepitant (EMEND) 150 mg in sodium chloride 0.9 % 145 mL IVPB, 150 mg, Intravenous,  Once, 2 of 4 cycles Administration: 150 mg (03/04/2020), 150 mg (03/28/2020)  for chemotherapy treatment.      CANCER STAGING: Cancer Staging No matching staging information was found for the patient.  INTERVAL HISTORY:  [name], a 69 y.o. male, returns for routine follow-up and consideration for next cycle of chemotherapy. [name] was last seen on 03/28/2020.  Due for day #8 of cycle #2 of gemcitabine today.   Today he  reports feeling tired and SOB with exertion and when lying down along with some anxiety. He has had panic attacks before and was treatment in the ED before. He is currently on 1.5 L Helper. He denies having diarrhea, but has N/V this morning. He had a bit of nausea over the past week and vomited this morning, mainly white foam.  He has an appointment with Dr. Jenetta Downer on 04/16/2020 for another Givens capsule study.  Overall, he feels ready for next cycle of chemo today.    REVIEW OF SYSTEMS:  Review of Systems  Constitutional: Positive for appetite change (moderately decreased) and fatigue (moderate).  Respiratory: Positive for shortness of breath (severe).   Cardiovascular: Positive for leg swelling.  Gastrointestinal: Positive for nausea and vomiting. Negative for constipation and diarrhea.  Psychiatric/Behavioral: Positive for sleep disturbance (feels uncomfortable). The patient is nervous/anxious.   All other systems reviewed and are negative.   PAST MEDICAL/SURGICAL HISTORY:  Past Medical History:  Diagnosis Date  . Arterial embolus of lower extremity ([place]) 11/23/2013  . Arthritis   . CAD (coronary artery disease)    a. s/p PCI with 3.5 x 18 BMS to mid RCA, non obst LAD, Lcx patent 08/10/13  . Cancer ([place]) 2021   bladder  . Depression, major, single episode, mild ([place]) 12/20/2019  . ED (erectile dysfunction) 03/27/2015  . Embolus of femoral artery [facility]) 2012   Surgical repair, previously on Coumadin  . Essential hypertension   . ETOH abuse   . Hematuria 10/17/2019  . Hernia of abdominal cavity 02/27/2019  .  History of DVT (deep vein thrombosis) 2011  . History of GI bleed    February 2012   . Ingrown left greater toenail 09/04/2018  . 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 Low oxygen saturation 01/31/2020  . 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 Pain with urination 10/17/2019  . Port-A-Cath in place  02/23/2020  . Prosthetic valve endocarditis ([place])   . PVD (peripheral vascular disease) ([place])   . S/P aortic valve replacement    Details not clear -  reportedly 1971 per patient   Past Surgical History:  Procedure Laterality Date  . ABDOMINAL AORTOGRAM W/LOWER EXTREMITY N/A 10/31/2018   Procedure: ABDOMINAL AORTOGRAM W/LOWER EXTREMITY;  Surgeon: Lorretta Harp, MD;  Location: [facility] LAB;  Service: Cardiovascular;  Laterality: N/A;  . Aoric valve replacement  January 2005   35m Medtronic Freestyle stentless porcine bioprosthesis ([facility]  . CARDIAC CATHETERIZATION     30-40% mid LAD, 20-30% ostial D1, proximal 30-40% RCA, 95% mid RCA thrombus, 40% distal stenosis. EF 45%, inferior HK.  .Marland KitchenCOLONOSCOPY N/A 04/20/2014   Procedure: COLONOSCOPY;  Surgeon: NRogene Houston MD;  [facility];  Service: Endoscopy;  Laterality: N/A;  930  . COLONOSCOPY WITH PROPOFOL N/A 02/02/2020   Procedure: COLONOSCOPY WITH PROPOFOL;  Surgeon: RRogene Houston MD;  [facility];  Service: Endoscopy;  Laterality: N/A;  . CORONARY ANGIOPLASTY WITH STENT PLACEMENT  07/2013  . ESOPHAGOGASTRODUODENOSCOPY (EGD) WITH PROPOFOL N/A 02/02/2020   Procedure: ESOPHAGOGASTRODUODENOSCOPY (EGD) WITH PROPOFOL;  Surgeon: RRogene Houston MD;  [facility];  Service: Endoscopy;  Laterality: N/A;  . INGUINAL HERNIA REPAIR Bilateral 04/17/2019   Procedure: ATTEMPTED LAPAROSCOPIC BILATERAL INGUINAL HERNIA REPAIR;  Surgeon: BCoralie Keens MD;  [place] [facility]  Service: General;  Laterality: Bilateral;  . INGUINAL HERNIA REPAIR Bilateral 04/17/2019   Procedure: Hernia Repair Inguinal Adult;  Surgeon: BCoralie Keens MD;  [place] [facility]  Service: General;  Laterality: Bilateral;  . IR IMAGING GUIDED PORT INSERTION  02/22/2020  . IR NEPHROSTOMY PLACEMENT LEFT  02/22/2020  . IR NEPHROSTOMY PLACEMENT RIGHT  02/22/2020  . LEFT HEART CATHETERIZATION WITH CORONARY ANGIOGRAM N/A 08/10/2013   Procedure:  LEFT HEART CATHETERIZATION WITH CORONARY ANGIOGRAM;  Surgeon: MBlane Ohara MD;  [facility];  Service: Cardiovascular;  Laterality: N/A;  . Left leg embolectomy  May 2012   Dr. BTrula Slade- reason for lifelong coumadin  . LYMPH NODE BIOPSY Left 11/21/2019   Procedure: Left Neck LYMPH NODE BIOPSY;  Surgeon: NRozetta Nunnery MD;  [place] [facility]  Service: ENT;  Laterality: Left;  . PERIPHERAL VASCULAR ATHERECTOMY  10/31/2018   Procedure: PERIPHERAL VASCULAR ATHERECTOMY;  Surgeon: BLorretta Harp MD;  [facility];  Service: Cardiovascular;;  left SFA  . POLYPECTOMY  02/02/2020   Procedure: POLYPECTOMY;  Surgeon: RRogene Houston MD;  [facility];  Service: Endoscopy;;  splenic flexure    SOCIAL HISTORY:  Social History   Socioeconomic History  . Marital status: Single    Spouse name: Not on file  . Number of children: 2  . Years of education: Not on file  . Highest education level: 11th grade  Occupational History  . Occupation: disability  Tobacco Use  . Smoking status: Former Smoker    Packs/day: 0.50    Years: 3.00    Pack years: 1.50  Types: Cigarettes    Start date: 10/12/1965    Quit date: 02/19/1970    Years since quitting: 50.1  . Smokeless tobacco: Never Used  Vaping Use  . Vaping Use: Never used  Substance and Sexual Activity  . Alcohol use: Not Currently  . Drug use: Yes    Types: Marijuana    Comment: pt smokes weed once in the morning and once in the evening  . Sexual activity: Not Currently  Other Topics Concern  . Not on file  Social History Narrative  . Not on file   Social Determinants of Health   Financial Resource Strain:   . Difficulty of Paying Living Expenses:   Food Insecurity:   . Worried About Charity fundraiser in the Last Year:   . Arboriculturist in the Last Year:   Transportation Needs:   . Film/video editor (Medical):   [name] Lack of Transportation (Non-Medical):   Physical Activity:     . Days of Exercise per Week:   . Minutes of Exercise per Session:   Stress:   . Feeling of Stress :   Social Connections:   . Frequency of Communication with Friends and Family:   . Frequency of Social Gatherings with Friends and Family:   . Attends Religious Services:   . Active Member of Clubs or Organizations:   . Attends Archivist Meetings:   [name] Marital Status:   Intimate Partner Violence:   . Fear of Current or Ex-Partner:   . Emotionally Abused:   [name] Physically Abused:   . Sexually Abused:     FAMILY HISTORY:  Family History  Problem Relation Age of Onset  . Diabetes Mother   . Cirrhosis Father   . COPD Sister     CURRENT MEDICATIONS:  Current Outpatient Medications  Medication Sig Dispense Refill  . aspirin EC 81 MG tablet Take 1 tablet (81 mg total) by mouth daily. 90 tablet 3  . atorvastatin (LIPITOR) 40 MG tablet Take 1 tablet (40 mg total) by mouth daily. 90 tablet 1  . busPIRone (BUSPAR) 10 MG tablet Take 1 tablet (10 mg total) by mouth 2 (two) times daily. 60 tablet 2  . cholecalciferol (VITAMIN D) 1000 units tablet Take 1 tablet (1,000 Units total) by mouth daily. 90 tablet 3  . clopidogrel (PLAVIX) 75 MG tablet Take 1 tablet by mouth once daily 90 tablet 0  . escitalopram (LEXAPRO) 10 MG tablet Take 1 tablet (10 mg total) by mouth daily. 30 tablet 1  . fluticasone (FLONASE) 50 MCG/ACT nasal spray Use 2 spray(s) in each nostril once daily 16 g 0  . furosemide (LASIX) 40 MG tablet Take 1 tablet (40 mg total) by mouth 2 (two) times daily. Take '40mg'$  in AM and '20mg'$  in PM. 90 tablet 0  . lidocaine-prilocaine (EMLA) cream Apply a small amount to port a cath site and cover with plastic wrap 1 hour prior to chemotherapy appointments 30 g 3  . metoprolol succinate (TOPROL-XL) 50 MG 24 hr tablet TAKE 1 TABLET BY MOUTH ONCE DAILY WITH  OR  IMMEDIATELY  FOLLOWING  A  MEAL 90 tablet 0  . montelukast (SINGULAIR) 10 MG tablet Take 1 tablet (10 mg total) by mouth at  bedtime. 90 tablet 0  . pantoprazole (PROTONIX) 40 MG tablet Take 1 tablet (40 mg total) by mouth daily before breakfast. 30 tablet 2  . prochlorperazine (COMPAZINE) 10 MG tablet Take 1 tablet (10 mg total) by mouth  every 6 (six) hours as needed (Nausea or vomiting). 60 tablet 3  . magnesium oxide (MAG-OX) 400 (241.3 Mg) MG tablet Take 1 tablet (400 mg total) by mouth 2 (two) times daily. 60 tablet 2  . nitroGLYCERIN (NITROSTAT) 0.4 MG SL tablet Place 1 tablet (0.4 mg total) under the tongue every 5 (five) minutes as needed for chest pain. (Patient not taking: Reported on 04/04/2020) 25 tablet 0  . trolamine salicylate (ASPERCREME) 10 % cream Apply 1 application topically daily as needed for muscle pain (for pain in feet).  (Patient not taking: Reported on 04/04/2020)     No current facility-administered medications for this visit.    ALLERGIES:  No Known Allergies  PHYSICAL EXAM:  Performance status (ECOG): 1 - Symptomatic but completely ambulatory  Vitals:   04/04/20 0854  BP: 132/73  Pulse: 79  Resp: (!) 21  Temp: 98.4 F (36.9 C)  SpO2: 99%   Wt Readings from Last 3 Encounters:  04/04/20 120 lb 12.8 oz (54.8 kg)  04/01/20 125 lb 11.2 oz (57 kg)  03/28/20 124 lb 11.2 oz (56.6 kg)   Physical Exam Vitals reviewed.  Constitutional:      Appearance: Normal appearance.  Cardiovascular:     Rate and Rhythm: Normal rate and regular rhythm.     Pulses: Normal pulses.     Heart sounds: Normal heart sounds.  Pulmonary:     Effort: Pulmonary effort is normal.     Breath sounds: Normal breath sounds.  Musculoskeletal:     Right lower leg: Edema (1+) present.     Left lower leg: No edema.  Neurological:     General: No focal deficit present.     Mental Status: He is alert and oriented to person, place, and time.  Psychiatric:        Mood and Affect: Mood normal.        Behavior: Behavior normal.     LABORATORY DATA:  I have reviewed the labs as listed.  CBC Latest Ref Rng &  Units 04/04/2020 03/28/2020 03/19/2020  WBC 4.0 - 10.5 K/uL 5.9 5.5 5.0  Hemoglobin 13.0 - 17.0 g/dL 8.8(L) 9.2(L) 9.2(L)  Hematocrit 39 - 52 % 27.4(L) 29.2(L) 29.8(L)  Platelets 150 - 400 K/uL 125(L) 228 243   CMP Latest Ref Rng & Units 04/04/2020 03/28/2020 03/19/2020  Glucose 70 - 99 mg/dL 112(H) 116(H) 88  BUN 8 - 23 mg/dL '12 12 20  '$ Creatinine 0.61 - 1.24 mg/dL 0.64 0.80 1.06  Sodium 135 - 145 mmol/L 127(L) 130(L) 128(L)  Potassium 3.5 - 5.1 mmol/L 3.5 3.8 3.9  Chloride 98 - 111 mmol/L 93(L) 96(L) 92(L)  CO2 22 - 32 mmol/L '24 24 26  '$ Calcium 8.9 - 10.3 mg/dL 7.8(L) 8.1(L) 8.2(L)  Total Protein 6.5 - 8.1 g/dL 7.0 6.8 6.8  Total Bilirubin 0.3 - 1.2 mg/dL 0.6 0.6 0.8  Alkaline Phos 38 - 126 U/L 311(H) 298(H) -  AST 15 - 41 U/L 23 37 19  ALT 0 - 44 U/L 27 37 14    DIAGNOSTIC IMAGING:  I have independently reviewed the scans and discussed with the patient. CT ABDOMEN PELVIS W CONTRAST  Result Date: 03/06/2020 CLINICAL DATA:  Fever and leukocytosis. Metastatic urothelial cancer. Recent initiation of carboplatin and gemcitabine therapy on 03/04/2020. Back pain and fever. EXAM: CT ABDOMEN AND PELVIS WITH CONTRAST TECHNIQUE: Multidetector CT imaging of the abdomen and pelvis was performed using the standard protocol following bolus administration of intravenous contrast. CONTRAST:  130m OMNIPAQUE  IOHEXOL 300 MG/ML  SOLN COMPARISON:  02/02/2020 FINDINGS: Lower chest: Moderate cardiomegaly with right coronary artery atherosclerosis. Prosthetic aortic valve. Mild scarring or atelectasis in the posterior basal segment right lower lobe. Hepatobiliary: Embolization coils along the porta hepatis. No discrete hepatic mass is identified. Gallbladder unremarkable. Pancreas: Unremarkable Spleen: Lobular spleen, unchanged from prior. Adrenals/Urinary Tract: Adrenal glands unremarkable. Complex lesion along the right kidney upper pole measuring 2.3 by 1.5 cm, internal density approximately 70 Hounsfield units,  probably a complex cyst and not appreciably changed from 02/02/2020. Prior PET-CT negative in this vicinity suggesting likely benign nature. Scarring in the right mid kidney. Scarring in the left kidney upper pole. Bilateral percutaneous nephrostomy tubes with coils formed in the renal pelvis bilaterally. No significant hydronephrosis. There is a large amount of tumor eccentric to the right in the urinary bladder. I cannot exclude invasion of the right seminal vesicle by tumor based on the current appearance. Dominant tumor in the right bladder measures about 8.4 by 4.5 cm on image 56/2. There is likely some tumor along the bladder base as well. Perivesical nodularity including a 1.0 cm soft tissue nodule adjacent to the right bladder tumor on image 54/5. There loops of bowel adjacent to the bladder tumor, without definite invasion of bowel by the tumor. Stomach/Bowel: Prominent stool throughout the colon favors constipation. No dilated small bowel. Vascular/Lymphatic: Aortoiliac atherosclerotic vascular disease. Left periaortic node 1.1 cm in short axis on image 21/2, previously 1.4 cm. Right external iliac node partially encasing the right external iliac artery, 3.7 cm in short axis on image 50/2, previously 3.0 cm. Left pelvic sidewall lymph node 1.1 cm in short axis on image 56/2, previously the same. Other bilateral external iliac lymph nodes are mildly enlarged and there are smaller inguinal lymph nodes noted. Reproductive: Prostatomegaly. As noted above, I cannot exclude bladder tumor invading back into the right seminal vesicle given the appearance for example on image 52/6. Other: Subcutaneous edema along the anterior abdominal wall extending down into the scrotum and penis which appear expanded by infiltrative edema. This edema also extends into the upper thigh region, right greater than left. Musculoskeletal: Scattered osseous metastatic disease including visualized ribs, lumbar spine, bony pelvis, and  inter trochanteric region of the right proximal femur. The sclerotic metastatic lesions are increased in size and number. An index lesion in the left inferior pubic ramus measures 2.0 by 1.1 cm, formerly 0.7 by 0.7 cm. Grade 1 degenerative anterolisthesis at L4-5 with bilateral resulting foraminal stenosis due to disc uncovering and facet arthropathy. Facet arthropathy also contributes to bilateral foraminal impingement at L5-S1. There is endplate sclerosis at L4-5 along with loss of disc height which is stable from the prior exam. There is central narrowing of the thecal sac at the L4-5 level not appreciably changed from 02/02/2020. I do not see compelling evidence of enhancing tumor in the epidural space although MRI is of course more sensitive. I not see a definite pathologic fracture. IMPRESSION: 1. Large amount of tumor eccentric to the right in the urinary bladder with some adjacent nodularity. I cannot exclude invasion of the right seminal vesicle by tumor. Mild retroperitoneal and bilateral external iliac adenopathy. Moderately worsened osseous metastatic disease. 2. Subcutaneous edema along the anterior abdominal wall extending down into the scrotum and penis which appear expanded by infiltrative edema. This also extends into the upper thigh region, right greater than left. It is quite possible that the large right external iliac node measuring 3.7 cm in short axis is  causing extrinsic compression on the right external iliac vein which may be contributing to the asymmetry of subcutaneous edema in the right upper thigh. 3. Other imaging findings of potential clinical significance: Moderate cardiomegaly with right coronary artery atherosclerosis. Bilateral percutaneous nephrostomy tubes without significant hydronephrosis. Prominent stool throughout the colon favors constipation. Complex lesion along the right kidney upper pole, probably a complex cyst given lack of PET activity, and not appreciably changed  from 02/02/2020. Lumbar spondylosis and degenerative disc disease causing impingement at L4-5 and L5-S1. Central narrowing of the thecal sac at L4-5 due to spondylosis and degenerative disc disease. 4. Aortic atherosclerosis. Aortic Atherosclerosis (ICD10-I70.0). Electronically Signed   By: Van Clines M.D.   On: 03/06/2020 20:48   DG Chest Port 1 View  Result Date: 03/06/2020 CLINICAL DATA:  Shortness of breath EXAM: PORTABLE CHEST 1 VIEW COMPARISON:  02/01/2020 FINDINGS: Interval placement of right internal jugular approach Port-A-Cath with distal tip terminating at the level of the distal SVC. Prior aortic valve replacement and ascending thoracic aorta stent graft. Median sternotomy. Stable cardiomegaly. Atherosclerotic calcification of the aortic knob. No focal airspace consolidation, pleural effusion, or pneumothorax. IMPRESSION: Interval placement of right chest port. Otherwise stable exam from prior. No acute cardiopulmonary abnormality. No pneumothorax. Electronically Signed   By: Davina Poke D.O.   On: 03/06/2020 16:54     ASSESSMENT:  1. Metastatic urothelial carcinoma: -Left supraclavicular lymph node biopsy on 11/21/2019 showed metastatic urothelial carcinoma. -PET scan on 10/30/2019 showed right bladder mass with right hydroureteronephrosis. Pelvic, retroperitoneal and left supraclavicular adenopathy. -CT CAP on 02/02/2020 shows progression of bladder wall thickening, increased retroperitoneal and pelvic adenopathy. Interval development of multiple faint foci of sclerosis involving ribs, spine, pelvic bones consistent with bone meta stasis. 2.7 cm exophytic right kidney mass but PET negative. Small amount of ascites. -PD-L1 CPS 1%.  Foundation 1 with TMB more than 10 Muts/Mb, MS-stable. -Cycle 1 day 1 of gemcitabine and carboplatin on 03/04/2020. -Hospitalization with urosepsis after cycle 1.   2. Bone metastasis: -Recent CT showed multiple foci of sclerosis involving  ribs, spine, pelvic bones consistent with metastatic disease. -We will consider denosumab after first cycle.   PLAN:  1. Metastatic urothelial carcinoma: -I have cut back on dose of gemcitabine during cycle 2. -I reviewed labs which showed normal LFTs and CBC.  Hemoglobin is 8.8 and platelets 125. -He will proceed with gemcitabine a dose reduction of 800 mg per metered squared. -He will come back in 1 week for follow-up for fluids.  Next chemo will be in 2 weeks.  2. Right dorsum and leg swelling: -This is from a lymphadenopathy in the pelvis.  This has improved slightly.  3. Bone metastasis: -We will consider denosumab with next cycle.  4. Hydronephrosis: -Bilateral percutaneous nephrostomy tubes on 02/22/2020. -Creatinine has become normal.  5. Microcytic anemia: -Hemoglobin today is 8.8.  He will receive second dose of Feraheme.  6.  Hypomagnesemia: -Magnesium today is 1.3.  He will receive magnesium IV today.  We will start him on magnesium 400 mg twice daily.  7.  Panic attacks: -He reports panic attacks, predominantly at nighttime.  We will start him on Klonopin 0.5 mg at bedtime.   Orders placed this encounter:  No orders of the defined types were placed in this encounter.    Derek Jack, MD [place] [phone]   I, Milinda Antis, am acting as a scribe for Dr. Sanda Linger.  Kinnie Scales MD, have reviewed the above  documentation for accuracy and completeness, and I agree with the above.

## 2020-04-04 NOTE — Patient Instructions (Signed)
[facility] Discharge Instructions  You were seen today by Dr. Delton Coombes. He went over your recent results. You received your treatment today. You will be prescribed clonazepam for your panic attacks to take at nighttime. You will be prescribed magnesium tablets to take twice daily, one in the morning and 1 in the evening. You will be scheduled to see the nurse practitioner in 1 week for follow up.   Thank you for choosing [facility] to provide your oncology and hematology care.  To afford each patient quality time with our provider, please arrive at least 15 minutes before your scheduled appointment time.   If you have a lab appointment with the [facility] please come in thru the Main Entrance and check in at the main information desk  You need to re-schedule your appointment should you arrive 10 or more minutes late.  We strive to give you quality time with our providers, and arriving late affects you and other patients whose appointments are after yours.  Also, if you no show three or more times for appointments you may be dismissed from the clinic at the providers discretion.     Again, thank you for choosing [facility].  Our hope is that these requests will decrease the amount of time that you wait before being seen by our physicians.       _____________________________________________________________  Should you have questions after your visit to [facility], please contact our office at [phone] between the hours of 8:00 a.m. and 4:30 p.m.  Voicemails left after 4:00 p.m. will not be returned until the following business day.  For prescription refill requests, have your pharmacy contact our office and allow 72 hours.    Cancer Center Support Programs:   > Cancer Support Group  2nd Tuesday of the month 1pm-2pm, Journey Room

## 2020-04-04 NOTE — Patient Instructions (Signed)
[facility] °Discharge Instructions for Patients Receiving Chemotherapy ° ° °Beginning January 23rd 2017 lab work for the Cancer Center will be done in the  °Main lab at [place] on 1st floor. °If you have a lab appointment with the Cancer Center please come in thru the  °Main Entrance and check in at the main information desk ° ° °Today you received the following chemotherapy agents Gemzar ° °To help prevent nausea and vomiting after your treatment, we encourage you to take your nausea medication °  °If you develop nausea and vomiting, or diarrhea that is not controlled by your medication, call the clinic. ° °The clinic phone number is [phone]. Office hours are Monday-Friday 8:30am-5:00pm. ° °BELOW ARE SYMPTOMS THAT SHOULD BE REPORTED IMMEDIATELY: °· *FEVER GREATER THAN 101.0 F °· *CHILLS WITH OR WITHOUT FEVER °· NAUSEA AND VOMITING THAT IS NOT CONTROLLED WITH YOUR NAUSEA MEDICATION °· *UNUSUAL SHORTNESS OF BREATH °· *UNUSUAL BRUISING OR BLEEDING °· TENDERNESS IN MOUTH AND THROAT WITH OR WITHOUT PRESENCE OF ULCERS °· *URINARY PROBLEMS °· *BOWEL PROBLEMS °· UNUSUAL RASH °Items with * indicate a potential emergency and should be followed up as soon as possible. °If you have an emergency after office hours please contact your primary care physician or go to the nearest emergency department. ° °Please call the clinic during office hours if you have any questions or concerns.  ° °You may also contact the Patient Navigator at [phone] should you have any questions or need assistance in obtaining follow up care. ° ° ° ° ° °Resources For Cancer Patients and their Caregivers °? [employer]: °Can assist with transportation, wigs, general needs, runs Look Good Feel Better.        °1-888-227-6333 °? Cancer Care: °Provides financial assistance, online support groups, medication/co-pay assistance.  °1-800-813-HOPE (4673) °? [facility] °Assists [place] cancer  patients and their families through emotional , educational and financial support.  336-427-4357 °? [place] DSS °Where to apply for food stamps, Medicaid and utility assistance. °336-342-1394 °? [facility]: °Transportation to medical appointments. 336-347-2287 °? Social Security Administration: °May apply for disability if have a Stage IV cancer. 336-342-7796 1-800-772-1213 °? [place] [facility]: °Assists with nutrition, care and transit needs. 336-349-2343 °  ° ° ° ° ° ° ° °

## 2020-04-04 NOTE — Progress Notes (Signed)
[name] presents today for [name] . Pt denies any new changes or symptoms since last treatment. Lab results and vitals have been reviewed and are stable and within parameters for treatment. Patient has been assessed by Dr. Delton Coombes who has approved proceeding with treatment today as planned.  Infusions tolerated without incident or complaint. VSS upon completion of treatment. Port flushed and deaccessed per protocol, see MAR and IV flowsheet for details. Discharged in satisfactory condition with follow up instructions.

## 2020-04-04 NOTE — Progress Notes (Signed)
Patient has been assessed, vital signs and labs have been reviewed by Dr. Delton Coombes. ANC, Creatinine, LFTs, and Platelets are within treatment parameters, Magnesium is low, give 1 liter fluids with electrolytes per Dr. Delton Coombes. Please also give Ativan 1mg  IV today per Dr.Katragadda.The patient is good to proceed with treatment at this time.

## 2020-04-05 ENCOUNTER — Ambulatory Visit (HOSPITAL_COMMUNITY)
Admission: RE | Admit: 2020-04-05 | Discharge: 2020-04-05 | Disposition: A | Discharge status: Home / Self Care | Payer: Medicare Other | Source: Ambulatory Visit | Attending: Urology | Admitting: Urology

## 2020-04-05 ENCOUNTER — Other Ambulatory Visit (HOSPITAL_COMMUNITY): Payer: Self-pay | Admitting: Interventional Radiology

## 2020-04-05 DIAGNOSIS — Z436 Encounter for attention to other artificial openings of urinary tract: Secondary | ICD-10-CM | POA: Insufficient documentation

## 2020-04-05 DIAGNOSIS — C670 Malignant neoplasm of trigone of bladder: Secondary | ICD-10-CM

## 2020-04-05 DIAGNOSIS — C688 Malignant neoplasm of overlapping sites of urinary organs: Secondary | ICD-10-CM | POA: Diagnosis not present

## 2020-04-05 HISTORY — PX: IR NEPHROSTOMY EXCHANGE LEFT: IMG6069

## 2020-04-05 HISTORY — PX: IR NEPHROSTOMY EXCHANGE RIGHT: IMG6070

## 2020-04-05 MED ORDER — LIDOCAINE HCL 1 % IJ SOLN
INTRAMUSCULAR | Status: DC | PRN
  Administered 2020-04-05: 5 mL

## 2020-04-05 MED ORDER — LIDOCAINE HCL 1 % IJ SOLN
INTRAMUSCULAR | Status: AC
  Filled 2020-04-05: qty 20

## 2020-04-05 MED ORDER — IOHEXOL 300 MG/ML  SOLN
50.0000 mL | Freq: Once | INTRAMUSCULAR | Status: AC | PRN
  Administered 2020-04-05: 10 mL

## 2020-04-11 ENCOUNTER — Inpatient Hospital Stay (HOSPITAL_COMMUNITY): Payer: Medicare Other

## 2020-04-11 ENCOUNTER — Inpatient Hospital Stay (HOSPITAL_COMMUNITY): Payer: Medicare Other | Admitting: Nurse Practitioner

## 2020-04-12 ENCOUNTER — Encounter (HOSPITAL_COMMUNITY): Payer: Self-pay | Admitting: Emergency Medicine

## 2020-04-12 ENCOUNTER — Emergency Department (HOSPITAL_COMMUNITY): Payer: Medicare Other

## 2020-04-12 ENCOUNTER — Other Ambulatory Visit: Payer: Self-pay

## 2020-04-12 ENCOUNTER — Inpatient Hospital Stay (HOSPITAL_COMMUNITY)
Admission: EM | Admit: 2020-04-12 | Discharge: 2020-04-17 | DRG: 292 | Disposition: A | Discharge status: Home Health | Payer: Medicare Other | Attending: Internal Medicine | Admitting: Internal Medicine

## 2020-04-12 DIAGNOSIS — I25119 Atherosclerotic heart disease of native coronary artery with unspecified angina pectoris: Secondary | ICD-10-CM | POA: Diagnosis not present

## 2020-04-12 DIAGNOSIS — R7401 Elevation of levels of liver transaminase levels: Secondary | ICD-10-CM | POA: Diagnosis not present

## 2020-04-12 DIAGNOSIS — D6959 Other secondary thrombocytopenia: Secondary | ICD-10-CM | POA: Diagnosis present

## 2020-04-12 DIAGNOSIS — I2510 Atherosclerotic heart disease of native coronary artery without angina pectoris: Secondary | ICD-10-CM | POA: Diagnosis present

## 2020-04-12 DIAGNOSIS — Z7902 Long term (current) use of antithrombotics/antiplatelets: Secondary | ICD-10-CM

## 2020-04-12 DIAGNOSIS — Z955 Presence of coronary angioplasty implant and graft: Secondary | ICD-10-CM

## 2020-04-12 DIAGNOSIS — F320 Major depressive disorder, single episode, mild: Secondary | ICD-10-CM | POA: Diagnosis present

## 2020-04-12 DIAGNOSIS — C678 Malignant neoplasm of overlapping sites of bladder: Secondary | ICD-10-CM | POA: Diagnosis present

## 2020-04-12 DIAGNOSIS — R634 Abnormal weight loss: Secondary | ICD-10-CM | POA: Diagnosis present

## 2020-04-12 DIAGNOSIS — Z86718 Personal history of other venous thrombosis and embolism: Secondary | ICD-10-CM

## 2020-04-12 DIAGNOSIS — C7910 Secondary malignant neoplasm of unspecified urinary organs: Secondary | ICD-10-CM | POA: Diagnosis present

## 2020-04-12 DIAGNOSIS — C7951 Secondary malignant neoplasm of bone: Secondary | ICD-10-CM | POA: Diagnosis not present

## 2020-04-12 DIAGNOSIS — Z86010 Personal history of colonic polyps: Secondary | ICD-10-CM

## 2020-04-12 DIAGNOSIS — F410 Panic disorder [episodic paroxysmal anxiety] without agoraphobia: Secondary | ICD-10-CM | POA: Diagnosis present

## 2020-04-12 DIAGNOSIS — I5023 Acute on chronic systolic (congestive) heart failure: Secondary | ICD-10-CM | POA: Diagnosis present

## 2020-04-12 DIAGNOSIS — E871 Hypo-osmolality and hyponatremia: Secondary | ICD-10-CM

## 2020-04-12 DIAGNOSIS — F411 Generalized anxiety disorder: Secondary | ICD-10-CM | POA: Diagnosis present

## 2020-04-12 DIAGNOSIS — H547 Unspecified visual loss: Secondary | ICD-10-CM | POA: Diagnosis present

## 2020-04-12 DIAGNOSIS — Z7982 Long term (current) use of aspirin: Secondary | ICD-10-CM | POA: Diagnosis not present

## 2020-04-12 DIAGNOSIS — M1990 Unspecified osteoarthritis, unspecified site: Secondary | ICD-10-CM | POA: Diagnosis not present

## 2020-04-12 DIAGNOSIS — I110 Hypertensive heart disease with heart failure: Principal | ICD-10-CM | POA: Diagnosis present

## 2020-04-12 DIAGNOSIS — J90 Pleural effusion, not elsewhere classified: Secondary | ICD-10-CM | POA: Diagnosis not present

## 2020-04-12 DIAGNOSIS — E222 Syndrome of inappropriate secretion of antidiuretic hormone: Secondary | ICD-10-CM | POA: Diagnosis present

## 2020-04-12 DIAGNOSIS — I739 Peripheral vascular disease, unspecified: Secondary | ICD-10-CM | POA: Diagnosis not present

## 2020-04-12 DIAGNOSIS — I517 Cardiomegaly: Secondary | ICD-10-CM | POA: Diagnosis not present

## 2020-04-12 DIAGNOSIS — I700 Atherosclerosis of aorta: Secondary | ICD-10-CM | POA: Diagnosis not present

## 2020-04-12 DIAGNOSIS — I509 Heart failure, unspecified: Secondary | ICD-10-CM

## 2020-04-12 DIAGNOSIS — N1330 Unspecified hydronephrosis: Secondary | ICD-10-CM | POA: Diagnosis not present

## 2020-04-12 DIAGNOSIS — I5043 Acute on chronic combined systolic (congestive) and diastolic (congestive) heart failure: Secondary | ICD-10-CM | POA: Diagnosis present

## 2020-04-12 DIAGNOSIS — N529 Male erectile dysfunction, unspecified: Secondary | ICD-10-CM | POA: Diagnosis present

## 2020-04-12 DIAGNOSIS — Z87891 Personal history of nicotine dependence: Secondary | ICD-10-CM | POA: Diagnosis not present

## 2020-04-12 DIAGNOSIS — C801 Malignant (primary) neoplasm, unspecified: Secondary | ICD-10-CM | POA: Diagnosis not present

## 2020-04-12 DIAGNOSIS — Z952 Presence of prosthetic heart valve: Secondary | ICD-10-CM

## 2020-04-12 DIAGNOSIS — R0602 Shortness of breath: Secondary | ICD-10-CM | POA: Diagnosis not present

## 2020-04-12 DIAGNOSIS — R079 Chest pain, unspecified: Secondary | ICD-10-CM | POA: Diagnosis not present

## 2020-04-12 DIAGNOSIS — Z20822 Contact with and (suspected) exposure to covid-19: Secondary | ICD-10-CM | POA: Diagnosis present

## 2020-04-12 DIAGNOSIS — T451X5A Adverse effect of antineoplastic and immunosuppressive drugs, initial encounter: Secondary | ICD-10-CM | POA: Diagnosis present

## 2020-04-12 DIAGNOSIS — Z79899 Other long term (current) drug therapy: Secondary | ICD-10-CM

## 2020-04-12 LAB — CBC WITH DIFFERENTIAL/PLATELET
Abs Immature Granulocytes: 0.03 10*3/uL (ref 0.00–0.07)
Basophils Absolute: 0 10*3/uL (ref 0.0–0.1)
Basophils Relative: 0 %
Eosinophils Absolute: 0 10*3/uL (ref 0.0–0.5)
Eosinophils Relative: 0 %
HCT: 29.2 % — ABNORMAL LOW (ref 39.0–52.0)
Hemoglobin: 9.4 g/dL — ABNORMAL LOW (ref 13.0–17.0)
Immature Granulocytes: 1 %
Lymphocytes Relative: 18 %
Lymphs Abs: 0.8 10*3/uL (ref 0.7–4.0)
MCH: 25.2 pg — ABNORMAL LOW (ref 26.0–34.0)
MCHC: 32.2 g/dL (ref 30.0–36.0)
MCV: 78.3 fL — ABNORMAL LOW (ref 80.0–100.0)
Monocytes Absolute: 0.4 10*3/uL (ref 0.1–1.0)
Monocytes Relative: 9 %
Neutro Abs: 3 10*3/uL (ref 1.7–7.7)
Neutrophils Relative %: 72 %
Platelets: 39 10*3/uL — ABNORMAL LOW (ref 150–400)
RBC: 3.73 MIL/uL — ABNORMAL LOW (ref 4.22–5.81)
RDW: 24.8 % — ABNORMAL HIGH (ref 11.5–15.5)
WBC: 4.3 10*3/uL (ref 4.0–10.5)
nRBC: 2.8 % — ABNORMAL HIGH (ref 0.0–0.2)

## 2020-04-12 LAB — COMPREHENSIVE METABOLIC PANEL
ALT: 216 U/L — ABNORMAL HIGH (ref 0–44)
AST: 76 U/L — ABNORMAL HIGH (ref 15–41)
Albumin: 3.3 g/dL — ABNORMAL LOW (ref 3.5–5.0)
Alkaline Phosphatase: 370 U/L — ABNORMAL HIGH (ref 38–126)
Anion gap: 14 (ref 5–15)
BUN: 18 mg/dL (ref 8–23)
CO2: 18 mmol/L — ABNORMAL LOW (ref 22–32)
Calcium: 8.2 mg/dL — ABNORMAL LOW (ref 8.9–10.3)
Chloride: 88 mmol/L — ABNORMAL LOW (ref 98–111)
Creatinine, Ser: 0.96 mg/dL (ref 0.61–1.24)
GFR calc Af Amer: >60 mL/min (ref >60)
GFR calc non Af Amer: >60 mL/min (ref >60)
Glucose, Bld: 99 mg/dL (ref 70–99)
Potassium: 4.5 mmol/L (ref 3.5–5.1)
Sodium: 120 mmol/L — ABNORMAL LOW (ref 135–145)
Total Bilirubin: 1 mg/dL (ref 0.3–1.2)
Total Protein: 6.5 g/dL (ref 6.5–8.1)

## 2020-04-12 LAB — URINALYSIS, ROUTINE W REFLEX MICROSCOPIC
Bacteria, UA: NONE SEEN
Bilirubin Urine: NEGATIVE
Glucose, UA: NEGATIVE mg/dL
Ketones, ur: NEGATIVE mg/dL
Nitrite: NEGATIVE
Protein, ur: >=300 mg/dL — AB
RBC / HPF: >50 RBC/hpf — ABNORMAL HIGH (ref 0–5)
Specific Gravity, Urine: 1.013 (ref 1.005–1.030)
WBC, UA: >50 WBC/hpf — ABNORMAL HIGH (ref 0–5)
pH: 5 (ref 5.0–8.0)

## 2020-04-12 LAB — BRAIN NATRIURETIC PEPTIDE: B Natriuretic Peptide: 4395 pg/mL — ABNORMAL HIGH (ref 0.0–100.0)

## 2020-04-12 MED ORDER — FUROSEMIDE 10 MG/ML IJ SOLN
40.0000 mg | INTRAMUSCULAR | Status: AC
  Administered 2020-04-12: 40 mg via INTRAVENOUS
  Filled 2020-04-12: qty 4

## 2020-04-12 MED ORDER — IOHEXOL 300 MG/ML  SOLN
100.0000 mL | Freq: Once | INTRAMUSCULAR | Status: AC | PRN
  Administered 2020-04-12: 100 mL via INTRAVENOUS

## 2020-04-12 NOTE — ED Provider Notes (Signed)
[facility] EMERGENCY DEPARTMENT Provider Note   CSN: [id number] Arrival date & time: 04/12/20  2011     History Chief Complaint  Patient presents with  . Multiple Compliants    [name] is a 69 y.o. male.  HPI   This patient is a 69 year old male, he is under the care of the oncology service here receiving chemotherapy for urothelial cancer, he has a history of a bladder mass that caused right hydroureteronephrosis.  There is also retroperitoneal and left supraclavicular adenopathy.  He was seen to have pelvic bony metastasis in May 2021.  He had bilateral percutaneous nephrostomy tubes placed about a month and a half ago.  The patient is receiving Feraheme because of anemia.  He presents today because of shortness of breath.  He reports that he is having panic attacks 3 or 4 times per day.  When this occurs he feels like he becomes very tachypneic and has to breathe slowly, he becomes very anxious.  He has been treated with multiple different medications including buspirone and escitalopram but despite taking these he still has breakthrough anxiety.  He does not have any chest pain with this, he does have swelling of his bilateral legs which is not new.  The medical record was reviewed, it shows that the patient had ultrasounds of his legs back in May 2021.  There was no signs of DVT.  Echocardiogram was performed in April 2021, ejection fraction 45 to 50%, stable left ventricular ejection fraction.  The patient denies fevers or chills or coughing, he has not had any significant abdominal pain diarrhea or fevers  Past Medical History:  Diagnosis Date  . Arterial embolus of lower extremity ([place]) 11/23/2013  . Arthritis   . CAD (coronary artery disease)    a. s/p PCI with 3.5 x 18 BMS to mid RCA, non obst LAD, Lcx patent 08/10/13  . Cancer ([place]) 2021   bladder  . Depression, major, single episode, mild ([place]) 12/20/2019  . ED (erectile dysfunction) 03/27/2015  . Embolus of  femoral artery [facility]) 2012   Surgical repair, previously on Coumadin  . Essential hypertension   . ETOH abuse   . Hematuria 10/17/2019  . Hernia of abdominal cavity 02/27/2019  . History of DVT (deep vein thrombosis) 2011  . History of GI bleed    February 2012   . Ingrown left greater toenail 09/04/2018  . 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 Low oxygen saturation 01/31/2020  . 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 Pain with urination 10/17/2019  . Port-A-Cath in place 02/23/2020  . Prosthetic valve endocarditis ([place])   . PVD (peripheral vascular disease) ([place])   . S/P aortic valve replacement    Details not clear -  reportedly 1971 per patient    Patient Active Problem List   Diagnosis Date Noted  . Hydronephrosis 04/01/2020  . Malignant neoplasm of overlapping sites of bladder ([place]) 03/29/2020  . Iron deficiency anemia 03/27/2020  . Nausea with vomiting 03/27/2020  . Encounter for support and coordination of transition of care 03/19/2020  . Fever in adult   . UTI (urinary tract infection) 03/06/2020  . Port-A-Cath in place 02/23/2020  . Hydrocele 02/21/2020  . Metastatic urothelial carcinoma ([place]) 02/13/2020  . Goals of care, counseling/discussion 02/13/2020  . Chronic anemia 01/31/2020  . Acute on chronic HFrEF (heart failure with reduced ejection fraction) -combined systolic  and diastolic dysfunction CHF 16/06/9603  . Nervously anxious 12/20/2019  . Depression, major, single episode, mild ([place]) 12/20/2019  . Weight loss, non-intentional 10/17/2019  . Annual visit for general adult medical examination with abnormal findings 09/12/2019  . Supraclavicular lymphadenopathy 09/12/2019  . Grade 3 out of 6 intensity murmur 09/12/2019  . Bilateral recurrent inguinal hernias 03/08/2019  . Claudication in peripheral vascular disease ([place]) 10/31/2018  . Peripheral arterial disease ([place]) 10/26/2018  .  Shortness of breath on exertion 10/16/2017  . Severe aortic insufficiency 10/02/2017  . Allergic rhinitis 12/30/2015  . Hyperlipidemia LDL goal <70 08/25/2013  . Aortic atherosclerosis ([place]) 08/12/2013  . Coronary atherosclerosis of native coronary artery 08/10/2013  . Hepatic artery aneurysm ([place]) 12/10/2010  . S/P aortic valve replacement   . Essential hypertension, benign 05/13/2010    Past Surgical History:  Procedure Laterality Date  . ABDOMINAL AORTOGRAM W/LOWER EXTREMITY N/A 10/31/2018   Procedure: ABDOMINAL AORTOGRAM W/LOWER EXTREMITY;  Surgeon: Lorretta Harp, MD;  Location: [place] CV LAB;  Service: Cardiovascular;  Laterality: N/A;  . Aoric valve replacement  January 2005   65mm Medtronic Freestyle stentless porcine bioprosthesis [facility])  . CARDIAC CATHETERIZATION     30-40% mid LAD, 20-30% ostial D1, proximal 30-40% RCA, 95% mid RCA thrombus, 40% distal stenosis. EF 45%, inferior HK.  [name] COLONOSCOPY N/A 04/20/2014   Procedure: COLONOSCOPY;  Surgeon: Rogene Houston, MD;  [facility];  Service: Endoscopy;  Laterality: N/A;  930  . COLONOSCOPY WITH PROPOFOL N/A 02/02/2020   Procedure: COLONOSCOPY WITH PROPOFOL;  Surgeon: Rogene Houston, MD;  Location: [facility];  Service: Endoscopy;  Laterality: N/A;  . CORONARY ANGIOPLASTY WITH STENT PLACEMENT  07/2013  . ESOPHAGOGASTRODUODENOSCOPY (EGD) WITH PROPOFOL N/A 02/02/2020   Procedure: ESOPHAGOGASTRODUODENOSCOPY (EGD) WITH PROPOFOL;  Surgeon: Rogene Houston, MD;  Location: [facility];  Service: Endoscopy;  Laterality: N/A;  . INGUINAL HERNIA REPAIR Bilateral 04/17/2019   Procedure: ATTEMPTED LAPAROSCOPIC BILATERAL INGUINAL HERNIA REPAIR;  Surgeon: Coralie Keens, MD;  Location: [place];  Service: General;  Laterality: Bilateral;  . INGUINAL HERNIA REPAIR Bilateral 04/17/2019   Procedure: Hernia Repair Inguinal Adult;  Surgeon: Coralie Keens, MD;  Location: [place];  Service: General;  Laterality: Bilateral;    . IR IMAGING GUIDED PORT INSERTION  02/22/2020  . IR NEPHROSTOMY EXCHANGE LEFT  04/05/2020  . IR NEPHROSTOMY EXCHANGE RIGHT  04/05/2020  . IR NEPHROSTOMY PLACEMENT LEFT  02/22/2020  . IR NEPHROSTOMY PLACEMENT RIGHT  02/22/2020  . LEFT HEART CATHETERIZATION WITH CORONARY ANGIOGRAM N/A 08/10/2013   Procedure: LEFT HEART CATHETERIZATION WITH CORONARY ANGIOGRAM;  Surgeon: Blane Ohara, MD;  Location: [facility] CATH LAB;  Service: Cardiovascular;  Laterality: N/A;  . Left leg embolectomy  May 2012   Dr. Trula Slade - reason for lifelong coumadin  . LYMPH NODE BIOPSY Left 11/21/2019   Procedure: Left Neck LYMPH NODE BIOPSY;  Surgeon: Rozetta Nunnery, MD;  Location: [place];  Service: ENT;  Laterality: Left;  . PERIPHERAL VASCULAR ATHERECTOMY  10/31/2018   Procedure: PERIPHERAL VASCULAR ATHERECTOMY;  Surgeon: Lorretta Harp, MD;  Location: [place] CV LAB;  Service: Cardiovascular;;  left SFA  . POLYPECTOMY  02/02/2020   Procedure: POLYPECTOMY;  Surgeon: Rogene Houston, MD;  [facility];  Service: Endoscopy;;  splenic flexure       Family History  Problem Relation Age of Onset  . Diabetes Mother   . Cirrhosis Father   . COPD Sister  Social History   Tobacco Use  . Smoking status: Former Smoker    Packs/day: 0.50    Years: 3.00    Pack years: 1.50    Types: Cigarettes    Start date: 10/12/1965    Quit date: 02/19/1970    Years since quitting: 50.1  . Smokeless tobacco: Never Used  Vaping Use  . Vaping Use: Never used  Substance Use Topics  . Alcohol use: Not Currently  . Drug use: Yes    Types: Marijuana    Comment: pt smokes weed once in the morning and once in the evening    Home Medications Prior to Admission medications   Medication Sig Start Date End Date Taking? Authorizing Provider  aspirin EC 81 MG tablet Take 1 tablet (81 mg total) by mouth daily. 11/01/17   [name], MD  atorvastatin (LIPITOR) 40 MG tablet Take 1 tablet (40 mg total) by mouth  daily. 03/21/20   Perlie Mayo, NP  busPIRone (BUSPAR) 10 MG tablet Take 1 tablet (10 mg total) by mouth 2 (two) times daily. 12/20/19   Perlie Mayo, NP  cholecalciferol (VITAMIN D) 1000 units tablet Take 1 tablet (1,000 Units total) by mouth daily. 11/01/17   [name], MD  clopidogrel (PLAVIX) 75 MG tablet Take 1 tablet by mouth once daily 03/21/20   [name], MD  escitalopram (LEXAPRO) 10 MG tablet Take 1 tablet (10 mg total) by mouth daily. 12/20/19   Perlie Mayo, NP  fluticasone Asencion Islam) 50 MCG/ACT nasal spray Use 2 spray(s) in each nostril once daily 03/22/20   Perlie Mayo, NP  furosemide (LASIX) 40 MG tablet Take 1 tablet (40 mg total) by mouth 2 (two) times daily. Take 40mg  in AM and 20mg  in PM. 03/22/20   Perlie Mayo, NP  lidocaine-prilocaine (EMLA) cream Apply a small amount to port a cath site and cover with plastic wrap 1 hour prior to chemotherapy appointments 02/23/20   Derek Jack, MD  magnesium oxide (MAG-OX) 400 (241.3 Mg) MG tablet Take 1 tablet (400 mg total) by mouth 2 (two) times daily. 04/04/20   Derek Jack, MD  metoprolol succinate (TOPROL-XL) 50 MG 24 hr tablet TAKE 1 TABLET BY MOUTH ONCE DAILY WITH&nbsp;&nbsp;OR&nbsp;&nbsp;IMMEDIATELY&nbsp;&nbsp;FOLLOWING&nbsp;&nbsp;A&nbsp;&nbsp;MEAL 03/22/20   Perlie Mayo, NP  montelukast (SINGULAIR) 10 MG tablet Take 1 tablet (10 mg total) by mouth at bedtime. 03/22/20   Perlie Mayo, NP  nitroGLYCERIN (NITROSTAT) 0.4 MG SL tablet Place 1 tablet (0.4 mg total) under the tongue every 5 (five) minutes as needed for chest pain. Patient not taking: Reported on 04/04/2020 12/20/19   Lorretta Harp, MD  pantoprazole (PROTONIX) 40 MG tablet Take 1 tablet (40 mg total) by mouth daily before breakfast. 02/03/20   Amin, Jeanella Flattery, MD  prochlorperazine (COMPAZINE) 10 MG tablet Take 1 tablet (10 mg total) by mouth every 6 (six) hours as needed (Nausea or vomiting). 03/28/20   Derek Jack, MD    trolamine salicylate (ASPERCREME) 10 % cream Apply 1 application topically daily as needed for muscle pain (for pain in feet).  Patient not taking: Reported on 04/04/2020    [provider]    Allergies    Patient has no known allergies.  Review of Systems   Review of Systems  All other systems reviewed and are negative.   Physical Exam Updated Vital Signs BP (!) 151/76 (BP Location: Left Arm)   Pulse 74   Temp 97.7 F (36.5 C) (Oral)  Resp 19   Ht 1.626 m (5\' 4" )   Wt 56.7 kg   SpO2 100%   BMI 21.46 kg/m   Physical Exam Vitals and nursing note reviewed.  Constitutional:      General: He is not in acute distress.    Appearance: He is well-developed.  HENT:     Head: Normocephalic and atraumatic.     Mouth/Throat:     Pharynx: No oropharyngeal exudate.  Eyes:     General: No scleral icterus.       Right eye: No discharge.        Left eye: No discharge.     Conjunctiva/sclera: Conjunctivae normal.     Pupils: Pupils are equal, round, and reactive to light.  Neck:     Thyroid: No thyromegaly.     Vascular: No JVD.  Cardiovascular:     Rate and Rhythm: Normal rate and regular rhythm.     Heart sounds: Normal heart sounds. No murmur heard.  No friction rub. No gallop.      Comments: Port in the right upper chest without overlying redness or tenderness Pulmonary:     Effort: Pulmonary effort is normal. No respiratory distress.     Breath sounds: Normal breath sounds. No wheezing or rales.     Comments: Speaks in full sentences, lungs are totally clear, no distress or increased work of breathing Abdominal:     General: Bowel sounds are normal. There is no distension.     Palpations: Abdomen is soft. There is no mass.     Tenderness: There is no abdominal tenderness.  Musculoskeletal:        General: No tenderness. Normal range of motion.     Cervical back: Normal range of motion and neck supple.     Right lower leg: Edema present.     Left lower leg:  Edema present.     Comments: 1+ symmetrical edema of the bilateral lower extremities  Lymphadenopathy:     Cervical: No cervical adenopathy.  Skin:    General: Skin is warm and dry.     Findings: No erythema or rash.  Neurological:     Mental Status: He is alert.     Coordination: Coordination normal.     Comments: Awake alert and following commands, moves all 4 extremities, no weakness  Psychiatric:        Behavior: Behavior normal.     ED Results / Procedures / Treatments   Labs (all labs ordered are listed, but only abnormal results are displayed) Labs Reviewed  CBC WITH DIFFERENTIAL/PLATELET - Abnormal; Notable for the following components:      Result Value   RBC 3.73 (*)    Hemoglobin 9.4 (*)    HCT 29.2 (*)    MCV 78.3 (*)    MCH 25.2 (*)    RDW 24.8 (*)    Platelets 39 (*)    nRBC 2.8 (*)    All other components within normal limits  COMPREHENSIVE METABOLIC PANEL - Abnormal; Notable for the following components:   Sodium 120 (*)    Chloride 88 (*)    CO2 18 (*)    Calcium 8.2 (*)    Albumin 3.3 (*)    AST 76 (*)    ALT 216 (*)    Alkaline Phosphatase 370 (*)    All other components within normal limits  BRAIN NATRIURETIC PEPTIDE - Abnormal; Notable for the following components:   B Natriuretic Peptide 4,395.0 (*)  All other components within normal limits  URINE CULTURE  SARS CORONAVIRUS 2 BY RT PCR [facility] ORDER, [place] LAB)  URINALYSIS, ROUTINE W REFLEX MICROSCOPIC    EKG EKG Interpretation  Date/Time:  Friday April 12 2020 20:19:17 EDT Ventricular Rate:  74 PR Interval:    QRS Duration: 110 QT Interval:  405 QTC Calculation: 450 R Axis:   -75 Text Interpretation: Sinus rhythm Prolonged PR interval Probable left atrial enlargement Left anterior fascicular block LVH with secondary repolarization abnormality findings similar to prior #EKG from 6/21 Confirmed by Noemi Chapel [phone]) on 04/12/2020 8:40:31  PM   Radiology DG Chest 2 View  Result Date: 04/12/2020 CLINICAL DATA:  Short of breath EXAM: CHEST - 2 VIEW COMPARISON:  03/06/2020, 02/12/2020 FINDINGS: Right-sided central venous port tip over the SVC. Post sternotomy changes with valve prostheses. Cardiomegaly. Small right greater than left pleural effusion. Scattered foci of sclerosis at the ribs and spine consistent with metastatic disease. Incompletely visualized pigtail catheter in the right upper quadrant. IMPRESSION: Cardiomegaly with small right greater than left pleural effusions. Scattered sclerotic foci overlying the ribs and spine, likely corresponding to known skeletal metastatic disease. Electronically Signed   By: Donavan Foil M.D.   On: 04/12/2020 21:47    Procedures .Critical Care Performed by: Noemi Chapel, MD Authorized by: Noemi Chapel, MD   Critical care provider statement:    Critical care time (minutes):  35   Critical care time was exclusive of:  Separately billable procedures and treating other patients and teaching time   Critical care was necessary to treat or prevent imminent or life-threatening deterioration of the following conditions:  Cardiac failure and endocrine crisis   Critical care was time spent personally by me on the following activities:  Blood draw for specimens, development of treatment plan with patient or surrogate, discussions with consultants, evaluation of patient's response to treatment, examination of patient, obtaining history from patient or surrogate, ordering and performing treatments and interventions, ordering and review of laboratory studies, ordering and review of radiographic studies, pulse oximetry, re-evaluation of patient's condition and review of old charts   (including critical care time)  Medications Ordered in ED Medications - No data to display  ED Course  I have reviewed the triage vital signs and the nursing notes.  Pertinent labs & imaging results that were  available during my care of the patient were reviewed by me and considered in my medical decision making (see chart for details).    MDM Rules/Calculators/A&P                          Ultimately my question is whether this patient is truly having anxiety or panic attacks or whether there is a more organic pathology.  His vital signs are very reassuring with a blood pressure of 150/76, he is afebrile with a temperature of 97.7, a pulse of 74 oxygen of 100% on room air and respirations at 19 breaths/min.  He has bilateral symmetrical chronic edema, good pulses, no other concerns clinically as I look at the patient.  I will obtain an x-ray, EKG and some lab work.  The patient is agreeable.  This patient has multiple lab abnormalities including a new thrombocytopenia with platelets of 39,000, a worsening hyponatremia with a sodium down to 120 which is lower than it ever has been.  His liver function tests have risen to about 200 with his ALT and 76 with his AST.  His bilirubin was 1.0.  His BNP was 4395 which is triple what it usually is.  His chest x-ray did show cardiomegaly with bilateral effusions.  This patient is critically ill with significant hyponatremia as well as congestive heart failure exacerbation.  He appears dyspneic in the room intermittently, thankfully is not tachycardic nor is he hypotensive.  I gave him intravenous Lasix, discussed the care with the hospitalist consultant who has been kind enough to admit the patient to the hospital.  Kathrene Bongo was evaluated in Emergency Department on 04/12/2020 for the symptoms described in the history of present illness. He was evaluated in the context of the global COVID-19 pandemic, which necessitated consideration that the patient might be at risk for infection with the SARS-CoV-2 virus that causes COVID-19. Institutional protocols and algorithms that pertain to the evaluation of patients at risk for COVID-19 are in a state of rapid change  based on information released by regulatory bodies including the CDC and federal and state organizations. These policies and algorithms were followed during the patient's care in the ED.   Final Clinical Impression(s) / ED Diagnoses Final diagnoses:  Hyponatremia  Acute on chronic congestive heart failure, unspecified heart failure type ([place])  Transaminitis      Noemi Chapel, MD 04/12/20 2244

## 2020-04-12 NOTE — ED Triage Notes (Signed)
Pt here via EMS with c/o anxiety attacks for several months, along with shortness of breath, chest pain, and bilateral leg swelling. Pt was recently diagnosed kidney CA. Pt arrives with bilateral nephrostomy tubes in place.

## 2020-04-13 ENCOUNTER — Other Ambulatory Visit: Payer: Self-pay

## 2020-04-13 ENCOUNTER — Encounter (HOSPITAL_COMMUNITY): Payer: Self-pay | Admitting: Family Medicine

## 2020-04-13 DIAGNOSIS — R7401 Elevation of levels of liver transaminase levels: Secondary | ICD-10-CM

## 2020-04-13 DIAGNOSIS — I509 Heart failure, unspecified: Secondary | ICD-10-CM

## 2020-04-13 DIAGNOSIS — E871 Hypo-osmolality and hyponatremia: Secondary | ICD-10-CM | POA: Insufficient documentation

## 2020-04-13 DIAGNOSIS — I5023 Acute on chronic systolic (congestive) heart failure: Secondary | ICD-10-CM

## 2020-04-13 LAB — CBC WITH DIFFERENTIAL/PLATELET
Abs Immature Granulocytes: 0.02 10*3/uL (ref 0.00–0.07)
Basophils Absolute: 0 10*3/uL (ref 0.0–0.1)
Basophils Relative: 0 %
Eosinophils Absolute: 0 10*3/uL (ref 0.0–0.5)
Eosinophils Relative: 0 %
HCT: 28.5 % — ABNORMAL LOW (ref 39.0–52.0)
Hemoglobin: 9.4 g/dL — ABNORMAL LOW (ref 13.0–17.0)
Immature Granulocytes: 1 %
Lymphocytes Relative: 24 %
Lymphs Abs: 1 10*3/uL (ref 0.7–4.0)
MCH: 25.5 pg — ABNORMAL LOW (ref 26.0–34.0)
MCHC: 33 g/dL (ref 30.0–36.0)
MCV: 77.4 fL — ABNORMAL LOW (ref 80.0–100.0)
Monocytes Absolute: 0.5 10*3/uL (ref 0.1–1.0)
Monocytes Relative: 11 %
Neutro Abs: 2.6 10*3/uL (ref 1.7–7.7)
Neutrophils Relative %: 64 %
Platelets: 46 10*3/uL — ABNORMAL LOW (ref 150–400)
RBC: 3.68 MIL/uL — ABNORMAL LOW (ref 4.22–5.81)
RDW: 24.9 % — ABNORMAL HIGH (ref 11.5–15.5)
WBC: 4.1 10*3/uL (ref 4.0–10.5)
nRBC: 2.2 % — ABNORMAL HIGH (ref 0.0–0.2)

## 2020-04-13 LAB — BASIC METABOLIC PANEL
Anion gap: 12 (ref 5–15)
Anion gap: 13 (ref 5–15)
BUN: 19 mg/dL (ref 8–23)
BUN: 19 mg/dL (ref 8–23)
CO2: 21 mmol/L — ABNORMAL LOW (ref 22–32)
CO2: 22 mmol/L (ref 22–32)
Calcium: 8.2 mg/dL — ABNORMAL LOW (ref 8.9–10.3)
Calcium: 8.1 mg/dL — ABNORMAL LOW (ref 8.9–10.3)
Chloride: 89 mmol/L — ABNORMAL LOW (ref 98–111)
Chloride: 89 mmol/L — ABNORMAL LOW (ref 98–111)
Creatinine, Ser: 0.89 mg/dL (ref 0.61–1.24)
Creatinine, Ser: 0.99 mg/dL (ref 0.61–1.24)
GFR calc Af Amer: >60 mL/min (ref >60)
GFR calc Af Amer: >60 mL/min (ref >60)
GFR calc non Af Amer: >60 mL/min (ref >60)
GFR calc non Af Amer: >60 mL/min (ref >60)
Glucose, Bld: 87 mg/dL (ref 70–99)
Glucose, Bld: 110 mg/dL — ABNORMAL HIGH (ref 70–99)
Potassium: 4.2 mmol/L (ref 3.5–5.1)
Potassium: 3.8 mmol/L (ref 3.5–5.1)
Sodium: 122 mmol/L — ABNORMAL LOW (ref 135–145)
Sodium: 124 mmol/L — ABNORMAL LOW (ref 135–145)

## 2020-04-13 LAB — COMPREHENSIVE METABOLIC PANEL
ALT: 186 U/L — ABNORMAL HIGH (ref 0–44)
AST: 65 U/L — ABNORMAL HIGH (ref 15–41)
Albumin: 3.1 g/dL — ABNORMAL LOW (ref 3.5–5.0)
Alkaline Phosphatase: 346 U/L — ABNORMAL HIGH (ref 38–126)
Anion gap: 13 (ref 5–15)
BUN: 19 mg/dL (ref 8–23)
CO2: 22 mmol/L (ref 22–32)
Calcium: 8.2 mg/dL — ABNORMAL LOW (ref 8.9–10.3)
Chloride: 89 mmol/L — ABNORMAL LOW (ref 98–111)
Creatinine, Ser: 0.93 mg/dL (ref 0.61–1.24)
GFR calc Af Amer: >60 mL/min (ref >60)
GFR calc non Af Amer: >60 mL/min (ref >60)
Glucose, Bld: 111 mg/dL — ABNORMAL HIGH (ref 70–99)
Potassium: 4 mmol/L (ref 3.5–5.1)
Sodium: 124 mmol/L — ABNORMAL LOW (ref 135–145)
Total Bilirubin: 0.9 mg/dL (ref 0.3–1.2)
Total Protein: 6 g/dL — ABNORMAL LOW (ref 6.5–8.1)

## 2020-04-13 LAB — CBG MONITORING, ED: Glucose-Capillary: 127 mg/dL — ABNORMAL HIGH (ref 70–99)

## 2020-04-13 LAB — MAGNESIUM
Magnesium: 1.8 mg/dL (ref 1.7–2.4)
Magnesium: 1.7 mg/dL (ref 1.7–2.4)

## 2020-04-13 LAB — SARS CORONAVIRUS 2 BY RT PCR (HOSPITAL ORDER, PERFORMED IN ~~LOC~~ HOSPITAL LAB): SARS Coronavirus 2: NEGATIVE

## 2020-04-13 LAB — TSH: TSH: 2.575 u[IU]/mL (ref 0.350–4.500)

## 2020-04-13 MED ORDER — ACETAMINOPHEN 325 MG PO TABS
650.0000 mg | ORAL_TABLET | ORAL | Status: DC | PRN
  Administered 2020-04-13 – 2020-04-16 (×2): 650 mg via ORAL
  Filled 2020-04-13 (×2): qty 2

## 2020-04-13 MED ORDER — CLONAZEPAM 0.5 MG PO TABS
0.5000 mg | ORAL_TABLET | Freq: Every day | ORAL | 0 refills | Status: DC
Start: 2020-04-13 — End: 2020-04-25

## 2020-04-13 MED ORDER — ONDANSETRON HCL 4 MG/2ML IJ SOLN: 4.0000 mg | Freq: Four times a day (QID) | INTRAMUSCULAR | Status: DC | PRN

## 2020-04-13 MED ORDER — LISINOPRIL 5 MG PO TABS
5.0000 mg | ORAL_TABLET | Freq: Every day | ORAL | Status: DC
  Administered 2020-04-13 – 2020-04-17 (×5): 5 mg via ORAL
  Filled 2020-04-13 (×5): qty 1

## 2020-04-13 MED ORDER — SODIUM CHLORIDE 0.9% FLUSH: 3.0000 mL | INTRAVENOUS | Status: DC | PRN

## 2020-04-13 MED ORDER — ESCITALOPRAM OXALATE 10 MG PO TABS
10.0000 mg | ORAL_TABLET | Freq: Every day | ORAL | Status: DC
  Administered 2020-04-13 – 2020-04-17 (×5): 10 mg via ORAL
  Filled 2020-04-13 (×5): qty 1

## 2020-04-13 MED ORDER — POTASSIUM CHLORIDE CRYS ER 20 MEQ PO TBCR
20.0000 meq | EXTENDED_RELEASE_TABLET | Freq: Every day | ORAL | Status: DC
  Administered 2020-04-14 – 2020-04-15 (×2): 20 meq via ORAL
  Filled 2020-04-13 (×2): qty 1

## 2020-04-13 MED ORDER — FUROSEMIDE 10 MG/ML IJ SOLN: 60.0000 mg | Freq: Every day | INTRAMUSCULAR | Status: DC

## 2020-04-13 MED ORDER — SODIUM CHLORIDE 0.9% FLUSH
3.0000 mL | Freq: Two times a day (BID) | INTRAVENOUS | Status: DC
  Administered 2020-04-13 – 2020-04-17 (×8): 3 mL via INTRAVENOUS

## 2020-04-13 MED ORDER — SODIUM CHLORIDE 0.9 % IV SOLN: 250.0000 mL | INTRAVENOUS | Status: DC | PRN

## 2020-04-13 MED ORDER — PANTOPRAZOLE SODIUM 40 MG PO TBEC
40.0000 mg | DELAYED_RELEASE_TABLET | Freq: Every day | ORAL | Status: DC
  Administered 2020-04-13 – 2020-04-17 (×5): 40 mg via ORAL
  Filled 2020-04-13 (×5): qty 1

## 2020-04-13 MED ORDER — TRAMADOL HCL 50 MG PO TABS
50.0000 mg | ORAL_TABLET | Freq: Once | ORAL | Status: AC
  Administered 2020-04-13: 50 mg via ORAL
  Filled 2020-04-13: qty 1

## 2020-04-13 MED ORDER — CLOPIDOGREL BISULFATE 75 MG PO TABS
75.0000 mg | ORAL_TABLET | Freq: Every day | ORAL | Status: DC
  Administered 2020-04-13 – 2020-04-17 (×5): 75 mg via ORAL
  Filled 2020-04-13 (×5): qty 1

## 2020-04-13 MED ORDER — FUROSEMIDE 10 MG/ML IJ SOLN
40.0000 mg | Freq: Two times a day (BID) | INTRAMUSCULAR | Status: DC
  Administered 2020-04-13 – 2020-04-16 (×7): 40 mg via INTRAVENOUS
  Filled 2020-04-13 (×7): qty 4

## 2020-04-13 MED ORDER — MONTELUKAST SODIUM 10 MG PO TABS
10.0000 mg | ORAL_TABLET | Freq: Every day | ORAL | Status: DC
  Administered 2020-04-13 – 2020-04-16 (×4): 10 mg via ORAL
  Filled 2020-04-13 (×4): qty 1

## 2020-04-13 MED ORDER — HEPARIN SODIUM (PORCINE) 5000 UNIT/ML IJ SOLN
5000.0000 [IU] | Freq: Three times a day (TID) | INTRAMUSCULAR | Status: DC
  Administered 2020-04-14: 5000 [IU] via SUBCUTANEOUS
  Filled 2020-04-13 (×2): qty 1

## 2020-04-13 MED ORDER — LORAZEPAM 2 MG/ML IJ SOLN: 1.0000 mg | INTRAMUSCULAR | Status: DC | PRN

## 2020-04-13 MED ORDER — FLUTICASONE PROPIONATE 50 MCG/ACT NA SUSP
2.0000 | Freq: Every day | NASAL | Status: DC
  Administered 2020-04-14 – 2020-04-17 (×4): 2 via NASAL
  Filled 2020-04-13 (×2): qty 16

## 2020-04-13 MED ORDER — LACTATED RINGERS IV SOLN: Freq: Once | INTRAVENOUS | Status: AC

## 2020-04-13 MED ORDER — ASPIRIN EC 81 MG PO TBEC
81.0000 mg | DELAYED_RELEASE_TABLET | Freq: Every day | ORAL | Status: DC
  Administered 2020-04-13 – 2020-04-17 (×5): 81 mg via ORAL
  Filled 2020-04-13 (×5): qty 1

## 2020-04-13 MED ORDER — ATORVASTATIN CALCIUM 40 MG PO TABS
40.0000 mg | ORAL_TABLET | Freq: Every day | ORAL | Status: DC
  Administered 2020-04-13 – 2020-04-17 (×5): 40 mg via ORAL
  Filled 2020-04-13 (×3): qty 1
  Filled 2020-04-13: qty 2
  Filled 2020-04-13: qty 1

## 2020-04-13 MED ORDER — METOPROLOL SUCCINATE ER 50 MG PO TB24
50.0000 mg | ORAL_TABLET | Freq: Every day | ORAL | Status: DC
  Administered 2020-04-13 – 2020-04-17 (×5): 50 mg via ORAL
  Filled 2020-04-13 (×4): qty 1
  Filled 2020-04-13: qty 2

## 2020-04-13 MED ORDER — NITROGLYCERIN 0.4 MG SL SUBL: 0.4000 mg | SUBLINGUAL_TABLET | SUBLINGUAL | Status: DC | PRN

## 2020-04-13 MED ORDER — BUSPIRONE HCL 5 MG PO TABS
10.0000 mg | ORAL_TABLET | Freq: Two times a day (BID) | ORAL | Status: DC
  Administered 2020-04-13 – 2020-04-17 (×9): 10 mg via ORAL
  Filled 2020-04-13 (×9): qty 2

## 2020-04-13 NOTE — Progress Notes (Signed)
ASSUMPTION OF CARE NOTE   04/13/2020 2:41 PM  [name] was seen and examined.  The H&P by the admitting provider, orders, imaging was reviewed.  Please see new orders.  Will continue to follow.   Vitals:   04/13/20 1039 04/13/20 1300  BP:  (!) 131/75  Pulse: 72 71  Resp: 18 16  Temp:    SpO2: 100% 100%    Results for orders placed or performed during the hospital encounter of 04/12/20  SARS Coronavirus 2 by RT PCR (hospital order, performed in [facility] hospital lab) Nasopharyngeal Nasopharyngeal Swab   Specimen: Nasopharyngeal Swab  Result Value Ref Range   SARS Coronavirus 2 NEGATIVE NEGATIVE  CBC with Differential/Platelet  Result Value Ref Range   WBC 4.3 4.0 - 10.5 K/uL   RBC 3.73 (L) 4.22 - 5.81 MIL/uL   Hemoglobin 9.4 (L) 13.0 - 17.0 g/dL   HCT 29.2 (L) 39 - 52 %   MCV 78.3 (L) 80.0 - 100.0 fL   MCH 25.2 (L) 26.0 - 34.0 pg   MCHC 32.2 30.0 - 36.0 g/dL   RDW 24.8 (H) 11.5 - 15.5 %   Platelets 39 (L) 150 - 400 K/uL   nRBC 2.8 (H) 0.0 - 0.2 %   Neutrophils Relative % 72 %   Neutro Abs 3.0 1.7 - 7.7 K/uL   Lymphocytes Relative 18 %   Lymphs Abs 0.8 0.7 - 4.0 K/uL   Monocytes Relative 9 %   Monocytes Absolute 0.4 0 - 1 K/uL   Eosinophils Relative 0 %   Eosinophils Absolute 0.0 0 - 0 K/uL   Basophils Relative 0 %   Basophils Absolute 0.0 0 - 0 K/uL   Immature Granulocytes 1 %   Abs Immature Granulocytes 0.03 0.00 - 0.07 K/uL  Comprehensive metabolic panel  Result Value Ref Range   Sodium 120 (L) 135 - 145 mmol/L   Potassium 4.5 3.5 - 5.1 mmol/L   Chloride 88 (L) 98 - 111 mmol/L   CO2 18 (L) 22 - 32 mmol/L   Glucose, Bld 99 70 - 99 mg/dL   BUN 18 8 - 23 mg/dL   Creatinine, Ser 0.96 0.61 - 1.24 mg/dL   Calcium 8.2 (L) 8.9 - 10.3 mg/dL   Total Protein 6.5 6.5 - 8.1 g/dL   Albumin 3.3 (L) 3.5 - 5.0 g/dL   AST 76 (H) 15 - 41 U/L   ALT 216 (H) 0 - 44 U/L   Alkaline Phosphatase 370 (H) 38 - 126 U/L   Total Bilirubin 1.0 0.3 - 1.2 mg/dL   GFR calc non Af  Amer >60 >60 mL/min   GFR calc Af Amer >60 >60 mL/min   Anion gap 14 5 - 15  Brain natriuretic peptide  Result Value Ref Range   B Natriuretic Peptide 4,395.0 (H) 0.0 - 100.0 pg/mL  Urinalysis, Routine w reflex microscopic  Result Value Ref Range   Color, Urine YELLOW YELLOW   APPearance CLOUDY (A) CLEAR   Specific Gravity, Urine 1.013 1.005 - 1.030   pH 5.0 5.0 - 8.0   Glucose, UA NEGATIVE NEGATIVE mg/dL   Hgb urine dipstick LARGE (A) NEGATIVE   Bilirubin Urine NEGATIVE NEGATIVE   Ketones, ur NEGATIVE NEGATIVE mg/dL   Protein, ur >=300 (A) NEGATIVE mg/dL   Nitrite NEGATIVE NEGATIVE   Leukocytes,Ua LARGE (A) NEGATIVE   RBC / HPF >50 (H) 0 - 5 RBC/hpf   WBC, UA >50 (H) 0 - 5 WBC/hpf   Bacteria, UA  NONE SEEN NONE SEEN   Mucus PRESENT   CBC WITH DIFFERENTIAL  Result Value Ref Range   WBC 4.1 4.0 - 10.5 K/uL   RBC 3.68 (L) 4.22 - 5.81 MIL/uL   Hemoglobin 9.4 (L) 13.0 - 17.0 g/dL   HCT 28.5 (L) 39 - 52 %   MCV 77.4 (L) 80.0 - 100.0 fL   MCH 25.5 (L) 26.0 - 34.0 pg   MCHC 33.0 30.0 - 36.0 g/dL   RDW 24.9 (H) 11.5 - 15.5 %   Platelets 46 (L) 150 - 400 K/uL   nRBC 2.2 (H) 0.0 - 0.2 %   Neutrophils Relative % 64 %   Neutro Abs 2.6 1.7 - 7.7 K/uL   Lymphocytes Relative 24 %   Lymphs Abs 1.0 0.7 - 4.0 K/uL   Monocytes Relative 11 %   Monocytes Absolute 0.5 0 - 1 K/uL   Eosinophils Relative 0 %   Eosinophils Absolute 0.0 0 - 0 K/uL   Basophils Relative 0 %   Basophils Absolute 0.0 0 - 0 K/uL   RBC Morphology MACROCYTOSIS PRESENT    Immature Granulocytes 1 %   Abs Immature Granulocytes 0.02 0.00 - 0.07 K/uL   Polychromasia PRESENT   Comprehensive metabolic panel  Result Value Ref Range   Sodium 124 (L) 135 - 145 mmol/L   Potassium 4.0 3.5 - 5.1 mmol/L   Chloride 89 (L) 98 - 111 mmol/L   CO2 22 22 - 32 mmol/L   Glucose, Bld 111 (H) 70 - 99 mg/dL   BUN 19 8 - 23 mg/dL   Creatinine, Ser 0.93 0.61 - 1.24 mg/dL   Calcium 8.2 (L) 8.9 - 10.3 mg/dL   Total Protein 6.0 (L) 6.5 -  8.1 g/dL   Albumin 3.1 (L) 3.5 - 5.0 g/dL   AST 65 (H) 15 - 41 U/L   ALT 186 (H) 0 - 44 U/L   Alkaline Phosphatase 346 (H) 38 - 126 U/L   Total Bilirubin 0.9 0.3 - 1.2 mg/dL   GFR calc non Af Amer >60 >60 mL/min   GFR calc Af Amer >60 >60 mL/min   Anion gap 13 5 - 15  Magnesium  Result Value Ref Range   Magnesium 1.7 1.7 - 2.4 mg/dL  TSH  Result Value Ref Range   TSH 2.575 0.350 - 4.500 uIU/mL  Magnesium  Result Value Ref Range   Magnesium 1.8 1.7 - 2.4 mg/dL  Basic metabolic panel  Result Value Ref Range   Sodium 122 (L) 135 - 145 mmol/L   Potassium 4.2 3.5 - 5.1 mmol/L   Chloride 89 (L) 98 - 111 mmol/L   CO2 21 (L) 22 - 32 mmol/L   Glucose, Bld 87 70 - 99 mg/dL   BUN 19 8 - 23 mg/dL   Creatinine, Ser 0.89 0.61 - 1.24 mg/dL   Calcium 8.2 (L) 8.9 - 10.3 mg/dL   GFR calc non Af Amer >60 >60 mL/min   GFR calc Af Amer >60 >60 mL/min   Anion gap 12 5 - 15  Basic metabolic panel  Result Value Ref Range   Sodium 124 (L) 135 - 145 mmol/L   Potassium 3.8 3.5 - 5.1 mmol/L   Chloride 89 (L) 98 - 111 mmol/L   CO2 22 22 - 32 mmol/L   Glucose, Bld 110 (H) 70 - 99 mg/dL   BUN 19 8 - 23 mg/dL   Creatinine, Ser 0.99 0.61 - 1.24 mg/dL   Calcium 8.1 (L) 8.9 -  10.3 mg/dL   GFR calc non Af Amer >60 >60 mL/min   GFR calc Af Amer >60 >60 mL/min   Anion gap 13 5 - 15  CBG monitoring, ED  Result Value Ref Range   Glucose-Capillary 127 (H) 70 - 99 mg/dL   Comment 1 Notify RN    Comment 2 Document in [place], MD [facility]   04/12/2020  8:14 PM How to contact the [facility] Attending or Consulting provider [place] or covering provider during after hours [place], for this patient?  1. Check the care team in [facility] and look for a) attending/consulting [facility] provider listed and b) the [facility] team listed 2. Log into [email or web address] and use [place]'s universal password to access. If you do not have the password, please contact the hospital operator. 3. Locate the [facility] provider you are  looking for under [facility] and page to a number that you can be directly reached. 4. If you still have difficulty reaching the provider, please page the [facility] (Director on Call) for the Hospitalists listed on amion for assistance.

## 2020-04-13 NOTE — ED Notes (Signed)
Patient had breakfast tray at this time.

## 2020-04-13 NOTE — ED Notes (Signed)
Report to Audrea Muscat, RN

## 2020-04-13 NOTE — ED Notes (Signed)
Eating meal   Sitting up   Awaiting bed assignment from bed control

## 2020-04-13 NOTE — H&P (Signed)
TRH H&P    Patient Demographics:    [name], is a 69 y.o. male  MRN: [id number]  DOB - [birth year]  Admit Date - 04/12/2020  Referring MD/NP/PA: Dr. Sabra Heck  Outpatient Primary MD for the patient is [name], MD  Chief complaint- anxiety attacks   HPI:    [name]  is a 69 y.o. male, with history of recently diagnosed renal cancer, peripheral vascular disease, history of DVT, hypertension, CAD, depression, and more presents to the ED with a chief complaint of panic attacks. Patient reports that he has had increased anxiety attacks over the last week. Patient reports that he has had 2 anxiety attacks today, but he has had up to 4 anxiety attacks in 1 day. He reports that they happen during all times of the day. He wakes up in the middle of the night gasping for air with chest pains and palpitations. I believe the these middle of the night events are orthopnea. Patient reports that he has peripheral edema. He has dyspnea. He describes that his dyspnea is worse with exertion. He reports that he can only walk 15 feet before he feels like he is going to pass out. He has not passed out. Patient reports that he has intermittent chest pain. He thought it was heartburn or indigestion at first, but it did not go away. Anxiety makes the chest pain worse. She has associated palpitations. Patient reports that he has a cough productive of clear sputum. He has had no fevers. He has no sick contacts. He has had Covid vaccine. His last chemo was 2 weeks ago. Allergy told him his next treatment will be in 6 weeks.  ED highlights Temperature 96.5, heart rate 71, blood pressure 151/76, respiratory rate 19, O2 100% White blood cell 4.3, hemoglobin 9.4, platelets 39 CHEM panel shows hyponatremia at 120 Alk phos 370, AST 76, ALT is 216 Chest x-ray shows cardiomegaly with right greater than left pleural effusions,  skeletal metastatic disease CT abdomen done and read is pending Nasal cannula applied for comfort    Review of systems:    Review of Systems  Constitutional: Positive for malaise/fatigue and weight loss. Negative for chills and fever.  HENT: Negative for congestion, sinus pain and sore throat.   Eyes: Negative for blurred vision and double vision.  Respiratory: Positive for cough, shortness of breath and wheezing. Negative for hemoptysis and sputum production.   Cardiovascular: Positive for chest pain, palpitations, orthopnea and leg swelling.  Gastrointestinal: Negative for abdominal pain, nausea and vomiting.  Genitourinary: Negative for flank pain and hematuria.  Musculoskeletal: Negative for back pain and myalgias.  Neurological: Positive for weakness. Negative for dizziness and headaches.  Psychiatric/Behavioral: Positive for substance abuse (marijuana use).     All other systems reviewed and are negative.    Past History of the following :    Past Medical History:  Diagnosis Date  . Arterial embolus of lower extremity ([place]) 11/23/2013  . Arthritis   . CAD (coronary artery disease)    a. s/p PCI with  3.5 x 18 BMS to mid RCA, non obst LAD, Lcx patent 08/10/13  . Cancer ([place]) 2021   bladder  . Depression, major, single episode, mild ([place]) 12/20/2019  . ED (erectile dysfunction) 03/27/2015  . Embolus of femoral artery [facility]) 2012   Surgical repair, previously on Coumadin  . Essential hypertension   . ETOH abuse   . Hematuria 10/17/2019  . Hernia of abdominal cavity 02/27/2019  . History of DVT (deep vein thrombosis) 2011  . History of GI bleed    February 2012   . Ingrown left greater toenail 09/04/2018  . 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 Low oxygen saturation 01/31/2020  . 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 Pain with urination 10/17/2019  . Port-A-Cath in place 02/23/2020  . Prosthetic  valve endocarditis ([place])   . PVD (peripheral vascular disease) ([place])   . S/P aortic valve replacement    Details not clear -  reportedly 1971 per patient      Past Surgical History:  Procedure Laterality Date  . ABDOMINAL AORTOGRAM W/LOWER EXTREMITY N/A 10/31/2018   Procedure: ABDOMINAL AORTOGRAM W/LOWER EXTREMITY;  Surgeon: Lorretta Harp, MD;  Location: [place] CV LAB;  Service: Cardiovascular;  Laterality: N/A;  . Aoric valve replacement  January 2005   36m Medtronic Freestyle stentless porcine bioprosthesis ([facility]  . CARDIAC CATHETERIZATION     30-40% mid LAD, 20-30% ostial D1, proximal 30-40% RCA, 95% mid RCA thrombus, 40% distal stenosis. EF 45%, inferior HK.  .Marland KitchenCOLONOSCOPY N/A 04/20/2014   Procedure: COLONOSCOPY;  Surgeon: NRogene Houston MD;  [facility];  Service: Endoscopy;  Laterality: N/A;  930  . COLONOSCOPY WITH PROPOFOL N/A 02/02/2020   Procedure: COLONOSCOPY WITH PROPOFOL;  Surgeon: RRogene Houston MD;  [facility];  Service: Endoscopy;  Laterality: N/A;  . CORONARY ANGIOPLASTY WITH STENT PLACEMENT  07/2013  . ESOPHAGOGASTRODUODENOSCOPY (EGD) WITH PROPOFOL N/A 02/02/2020   Procedure: ESOPHAGOGASTRODUODENOSCOPY (EGD) WITH PROPOFOL;  Surgeon: RRogene Houston MD;  [facility];  Service: Endoscopy;  Laterality: N/A;  . INGUINAL HERNIA REPAIR Bilateral 04/17/2019   Procedure: ATTEMPTED LAPAROSCOPIC BILATERAL INGUINAL HERNIA REPAIR;  Surgeon: BCoralie Keens MD;  [place]  Service: General;  Laterality: Bilateral;  . INGUINAL HERNIA REPAIR Bilateral 04/17/2019   Procedure: Hernia Repair Inguinal Adult;  Surgeon: BCoralie Keens MD;  [place]  Service: General;  Laterality: Bilateral;  . IR IMAGING GUIDED PORT INSERTION  02/22/2020  . IR NEPHROSTOMY EXCHANGE LEFT  04/05/2020  . IR NEPHROSTOMY EXCHANGE RIGHT  04/05/2020  . IR NEPHROSTOMY PLACEMENT LEFT  02/22/2020  . IR NEPHROSTOMY PLACEMENT RIGHT  02/22/2020  . LEFT HEART  CATHETERIZATION WITH CORONARY ANGIOGRAM N/A 08/10/2013   Procedure: LEFT HEART CATHETERIZATION WITH CORONARY ANGIOGRAM;  Surgeon: MBlane Ohara MD;  [facility];  Service: Cardiovascular;  Laterality: N/A;  . Left leg embolectomy  May 2012   Dr. BTrula Slade- reason for lifelong coumadin  . LYMPH NODE BIOPSY Left 11/21/2019   Procedure: Left Neck LYMPH NODE BIOPSY;  Surgeon: NRozetta Nunnery MD;  [place]  Service: ENT;  Laterality: Left;  . PERIPHERAL VASCULAR ATHERECTOMY  10/31/2018   Procedure: PERIPHERAL VASCULAR ATHERECTOMY;  Surgeon: BLorretta Harp MD;  [facility];  Service: Cardiovascular;;  left SFA  . POLYPECTOMY  02/02/2020   Procedure: POLYPECTOMY;  Surgeon:  Rogene Houston, MD;  [facility];  Service: Endoscopy;;  splenic flexure      Social History:      Social History   Tobacco Use  . Smoking status: Former Smoker    Packs/day: 0.50    Years: 3.00    Pack years: 1.50    Types: Cigarettes    Start date: 10/12/1965    Quit date: 02/19/1970    Years since quitting: 50.1  . Smokeless tobacco: Never Used  Substance Use Topics  . Alcohol use: Not Currently       Family History :     Family History  Problem Relation Age of Onset  . Diabetes Mother   . Cirrhosis Father   . COPD Sister       Home Medications:   Prior to Admission medications   Medication Sig Start Date End Date Taking? Authorizing Provider  aspirin EC 81 MG tablet Take 1 tablet (81 mg total) by mouth daily. 11/01/17  Yes [name], MD  atorvastatin (LIPITOR) 40 MG tablet Take 1 tablet (40 mg total) by mouth daily. 03/21/20  Yes Perlie Mayo, NP  cholecalciferol (VITAMIN D) 1000 units tablet Take 1 tablet (1,000 Units total) by mouth daily. 11/01/17  Yes [name], MD  clopidogrel (PLAVIX) 75 MG tablet Take 1 tablet by mouth once daily Patient taking differently: Take 75 mg by mouth daily.  03/21/20  Yes [name],  MD  fluticasone (FLONASE) 50 MCG/ACT nasal spray Use 2 spray(s) in each nostril once daily Patient taking differently: Place 2 sprays into both nostrils daily.  03/22/20  Yes Perlie Mayo, NP  furosemide (LASIX) 40 MG tablet Take 1 tablet (40 mg total) by mouth 2 (two) times daily. Take 68m in AM and 227min PM. 03/22/20  Yes MiPerlie MayoNP  lidocaine-prilocaine (EMLA) cream Apply a small amount to port a cath site and cover with plastic wrap 1 hour prior to chemotherapy appointments 02/23/20  Yes KaDerek JackMD  magnesium oxide (MAG-OX) 400 (241.3 Mg) MG tablet Take 1 tablet (400 mg total) by mouth 2 (two) times daily. 04/04/20  Yes KaDerek JackMD  metoprolol succinate (TOPROL-XL) 50 MG 24 hr tablet TAKE 1 TABLET BY MOUTH ONCE DAILY WITH&nbsp;&nbsp;OR&nbsp;&nbsp;IMMEDIATELY&nbsp;&nbsp;FOLLOWING&nbsp;&nbsp;A&nbsp;&nbsp;MEAL Patient taking differently: Take 50 mg by mouth daily. WITH  OR  IMMEDIATELY  FOLLOWING  A  MEAL 03/22/20  Yes MiPerlie MayoNP  montelukast (SINGULAIR) 10 MG tablet Take 1 tablet (10 mg total) by mouth at bedtime. 03/22/20  Yes MiPerlie MayoNP  nitroGLYCERIN (NITROSTAT) 0.4 MG SL tablet Place 1 tablet (0.4 mg total) under the tongue every 5 (five) minutes as needed for chest pain. 12/20/19  Yes BeLorretta HarpMD  prochlorperazine (COMPAZINE) 10 MG tablet Take 1 tablet (10 mg total) by mouth every 6 (six) hours as needed (Nausea or vomiting). 03/28/20  Yes KaDerek JackMD  trolamine salicylate (ASPERCREME) 10 % cream Apply 1 application topically daily as needed for muscle pain (for pain in feet). For pain in legs (swelling)   Yes [provider]  busPIRone (BUSPAR) 10 MG tablet Take 1 tablet (10 mg total) by mouth 2 (two) times daily. Patient not taking: Reported on 04/12/2020 12/20/19   MiPerlie MayoNP  escitalopram (LEXAPRO) 10 MG tablet Take 1 tablet (10 mg total) by mouth daily. Patient not taking: Reported on 04/12/2020 12/20/19    MiPerlie MayoNP  pantoprazole (PROTONIX) 40  MG tablet Take 1 tablet (40 mg total) by mouth daily before breakfast. Patient not taking: Reported on 04/12/2020 02/03/20   Damita Lack, MD     Allergies:    No Known Allergies   Physical Exam:   Vitals  Blood pressure (!) 151/76, pulse 75, temperature 97.7 F (36.5 C), temperature source Oral, resp. rate 19, height '5\' 4"'  (1.626 m), weight 56.7 kg, SpO2 97 %.  1.  General: Lying supine in bed in no acute distress  2. Psychiatric: Anxious, behavior normal for situation  3. Neurologic: Cranial nerves II through XII are grossly intact No focal deficits on limited exam Oriented x3  4. HEENMT:  Head is atraumatic, normocephalic Pupils reactive to light Neck is cachectic Trachea is midline  5. Respiratory : Diminished breath sounds in the lower lung fields bilaterally Wheeze bilaterally No rhonchi or crackles No accessory muscle use Nasal cannula in place for comfort  6. Cardiovascular : Heart rate is normal rhythm is regular Peripheral edema  7. Gastrointestinal:  Abdomen is soft, nontender, nondistended Bowel sounds present Bilateral nephrostomy tube bags have clear to yellow urine present, no gross blood  8. Skin:  No lesions on limited skin exam  9.Musculoskeletal:  2+ peripheral edema    Data Review:    CBC Recent Labs  Lab 04/12/20 2050  WBC 4.3  HGB 9.4*  HCT 29.2*  PLT 39*  MCV 78.3*  MCH 25.2*  MCHC 32.2  RDW 24.8*  LYMPHSABS 0.8  MONOABS 0.4  EOSABS 0.0  BASOSABS 0.0   ------------------------------------------------------------------------------------------------------------------  Results for orders placed or performed during the hospital encounter of 04/12/20 (from the past 48 hour(s))  CBC with Differential/Platelet     Status: Abnormal   Collection Time: 04/12/20  8:50 PM  Result Value Ref Range   WBC 4.3 4.0 - 10.5 K/uL   RBC 3.73 (L) 4.22 - 5.81 MIL/uL   Hemoglobin 9.4  (L) 13.0 - 17.0 g/dL   HCT 29.2 (L) 39 - 52 %   MCV 78.3 (L) 80.0 - 100.0 fL   MCH 25.2 (L) 26.0 - 34.0 pg   MCHC 32.2 30.0 - 36.0 g/dL   RDW 24.8 (H) 11.5 - 15.5 %   Platelets 39 (L) 150 - 400 K/uL    Comment: SPECIMEN CHECKED FOR CLOTS   nRBC 2.8 (H) 0.0 - 0.2 %   Neutrophils Relative % 72 %   Neutro Abs 3.0 1.7 - 7.7 K/uL   Lymphocytes Relative 18 %   Lymphs Abs 0.8 0.7 - 4.0 K/uL   Monocytes Relative 9 %   Monocytes Absolute 0.4 0 - 1 K/uL   Eosinophils Relative 0 %   Eosinophils Absolute 0.0 0 - 0 K/uL   Basophils Relative 0 %   Basophils Absolute 0.0 0 - 0 K/uL   Immature Granulocytes 1 %   Abs Immature Granulocytes 0.03 0.00 - 0.07 K/uL    Comment: Performed at [facility], [address], [place], [place] [address]  Comprehensive metabolic panel     Status: Abnormal   Collection Time: 04/12/20  8:50 PM  Result Value Ref Range   Sodium 120 (L) 135 - 145 mmol/L   Potassium 4.5 3.5 - 5.1 mmol/L   Chloride 88 (L) 98 - 111 mmol/L   CO2 18 (L) 22 - 32 mmol/L   Glucose, Bld 99 70 - 99 mg/dL    Comment: Glucose reference range applies only to samples taken after fasting for at least 8 hours.  BUN 18 8 - 23 mg/dL   Creatinine, Ser 0.96 0.61 - 1.24 mg/dL   Calcium 8.2 (L) 8.9 - 10.3 mg/dL   Total Protein 6.5 6.5 - 8.1 g/dL   Albumin 3.3 (L) 3.5 - 5.0 g/dL   AST 76 (H) 15 - 41 U/L   ALT 216 (H) 0 - 44 U/L   Alkaline Phosphatase 370 (H) 38 - 126 U/L   Total Bilirubin 1.0 0.3 - 1.2 mg/dL   GFR calc non Af Amer >60 >60 mL/min   GFR calc Af Amer >60 >60 mL/min   Anion gap 14 5 - 15    Comment: Performed at [facility], [address] [place], [place] [address]  Brain natriuretic peptide     Status: Abnormal   Collection Time: 04/12/20  8:50 PM  Result Value Ref Range   B Natriuretic Peptide 4,395.0 (H) 0.0 - 100.0 pg/mL    Comment: Performed at [facility], [address], [place], [place] [address]  Urinalysis, Routine w reflex microscopic     Status: Abnormal    Collection Time: 04/12/20 10:25 PM  Result Value Ref Range   Color, Urine YELLOW YELLOW   APPearance CLOUDY (A) CLEAR   Specific Gravity, Urine 1.013 1.005 - 1.030   pH 5.0 5.0 - 8.0   Glucose, UA NEGATIVE NEGATIVE mg/dL   Hgb urine dipstick LARGE (A) NEGATIVE   Bilirubin Urine NEGATIVE NEGATIVE   Ketones, ur NEGATIVE NEGATIVE mg/dL   Protein, ur >=300 (A) NEGATIVE mg/dL   Nitrite NEGATIVE NEGATIVE   Leukocytes,Ua LARGE (A) NEGATIVE   RBC / HPF >50 (H) 0 - 5 RBC/hpf   WBC, UA >50 (H) 0 - 5 WBC/hpf   Bacteria, UA NONE SEEN NONE SEEN   Mucus PRESENT     Comment: Performed at [facility], [address] [place], [place] [address]    Chemistries  Recent Labs  Lab 04/12/20 2050  NA 120*  K 4.5  CL 88*  CO2 18*  GLUCOSE 99  BUN 18  CREATININE 0.96  CALCIUM 8.2*  AST 76*  ALT 216*  ALKPHOS 370*  BILITOT 1.0   ------------------------------------------------------------------------------------------------------------------  ------------------------------------------------------------------------------------------------------------------ GFR: Estimated Creatinine Clearance: 59.1 mL/min (by C-G formula based on SCr of 0.96 mg/dL). Liver Function Tests: Recent Labs  Lab 04/12/20 2050  AST 76*  ALT 216*  ALKPHOS 370*  BILITOT 1.0  PROT 6.5  ALBUMIN 3.3*   No results for input(s): LIPASE, AMYLASE in the last 168 hours. No results for input(s): AMMONIA in the last 168 hours. Coagulation Profile: No results for input(s): INR, PROTIME in the last 168 hours. Cardiac Enzymes: No results for input(s): CKTOTAL, CKMB, CKMBINDEX, TROPONINI in the last 168 hours. BNP (last 3 results) No results for input(s): PROBNP in the last 8760 hours. HbA1C: No results for input(s): HGBA1C in the last 72 hours. CBG: No results for input(s): GLUCAP in the last 168 hours. Lipid Profile: No results for input(s): CHOL, HDL, LDLCALC, TRIG, CHOLHDL, LDLDIRECT in the last 72 hours. Thyroid  Function Tests: No results for input(s): TSH, T4TOTAL, FREET4, T3FREE, THYROIDAB in the last 72 hours. Anemia Panel: No results for input(s): VITAMINB12, FOLATE, FERRITIN, TIBC, IRON, RETICCTPCT in the last 72 hours.  --------------------------------------------------------------------------------------------------------------- Urine analysis:    Component Value Date/Time   COLORURINE YELLOW 04/12/2020 2225   APPEARANCEUR CLOUDY (A) 04/12/2020 2225   LABSPEC 1.013 04/12/2020 2225   PHURINE 5.0 04/12/2020 2225   GLUCOSEU NEGATIVE 04/12/2020 2225   GLUCOSEU 100 (A) 07/08/2010 1902  HGBUR LARGE (A) 04/12/2020 2225   HGBUR small 07/10/2010 1004   BILIRUBINUR NEGATIVE 04/12/2020 2225   BILIRUBINUR neg 02/21/2020 0920   KETONESUR NEGATIVE 04/12/2020 2225   PROTEINUR >=300 (A) 04/12/2020 2225   UROBILINOGEN negative (A) 02/21/2020 0920   UROBILINOGEN 0.2 02/05/2011 1016   NITRITE NEGATIVE 04/12/2020 2225   LEUKOCYTESUR LARGE (A) 04/12/2020 2225      Imaging Results:    DG Chest 2 View  Result Date: 04/12/2020 CLINICAL DATA:  Short of breath EXAM: CHEST - 2 VIEW COMPARISON:  03/06/2020, 02/12/2020 FINDINGS: Right-sided central venous port tip over the SVC. Post sternotomy changes with valve prostheses. Cardiomegaly. Small right greater than left pleural effusion. Scattered foci of sclerosis at the ribs and spine consistent with metastatic disease. Incompletely visualized pigtail catheter in the right upper quadrant. IMPRESSION: Cardiomegaly with small right greater than left pleural effusions. Scattered sclerotic foci overlying the ribs and spine, likely corresponding to known skeletal metastatic disease. Electronically Signed   By: Donavan Foil M.D.   On: 04/12/2020 21:47   CT ABDOMEN PELVIS W CONTRAST  Result Date: 04/12/2020 CLINICAL DATA:  Abdomen pain possible biliary obstruction bilateral leg swelling EXAM: CT ABDOMEN AND PELVIS WITH CONTRAST TECHNIQUE: Multidetector CT imaging of  the abdomen and pelvis was performed using the standard protocol following bolus administration of intravenous contrast. CONTRAST:  15m OMNIPAQUE IOHEXOL 300 MG/ML  SOLN COMPARISON:  CT 03/06/2020, 02/02/2020 FINDINGS: Lower chest: Lung bases demonstrate new small moderate right pleural effusion. Marked cardiomegaly with multi chamber enlargement. Hepatobiliary: Mild loculated fluid along the anterior aspect of the liver. No focal hepatic abnormality. No calcified gallstone or biliary dilatation. Embolization coils at the porta hepatis. Pancreas: Unremarkable. No pancreatic ductal dilatation or surrounding inflammatory changes. Spleen: Unchanged lobulated spleen with coarse calcification. Adrenals/Urinary Tract: Adrenal glands are within normal limits. Bilateral nephrostomy tubes remain in place. No significant hydronephrosis. Cortical scarring within both kidneys. Slightly dense exophytic lesion off the upper pole right kidney measuring 2.3 x 1.4 cm, previously 2.3 x 1.5 cm. Eccentric right bladder mass measures slightly smaller today, 7 cm AP x 5 cm transverse compared with 8.4 x 4.5 cm previously. Possible right seminal vesicle tumor invasion as before. Stomach/Bowel: The stomach is decompressed. No dilated small bowel. No bowel wall thickening. Vascular/Lymphatic: Tortuous aorta with atherosclerosis. No aneurysm. Decreased retroperitoneal and pelvic adenopathy. For example left Peri aortic lymph node measures 8 mm compared with 11 mm previously, series 2, image number 19. Right external iliac node now measures 17 mm, compared with 3.7 cm previously. Reproductive: Prostate enlarged. 2.4 cm relative area of hyperenhancement within the left posterior gland. Other: No free air. Extensive subcutaneous edema consistent with anasarca. Large bilateral scrotal fluid collections. Musculoskeletal: Multiple sclerotic foci within the pelvic bones, spine and ribs consistent with osseous metastatic disease. Some of the  lesions appear increased in size with more dense sclerosis, for example left inferior pubic ramus lesion measures 22 mm compared with 20 mm previously. Increased size of sclerotic lesions within the lumbar vertebra. IMPRESSION: 1. Negative for calcified gallstone or biliary dilatation. 2. Extensive subcutaneous edema consistent with generalized anasarca with large bilateral scrotal fluid collections. Cardiomegaly with small moderate right pleural effusion. 3. Slight interval decrease in size of eccentric right bladder mass. Decreased retroperitoneal and pelvic adenopathy. New small amount loculated right perihepatic fluid. 4. Slight interval decrease in size of eccentric right bladder mass with possible invasion of right seminal vesicle. Decreased retroperitoneal and pelvic adenopathy compared to most recent prior. 5.  Stable positioning of bilateral nephrostomy tubes without hydronephrosis 6. Stable slightly dense exophytic lesion off the upper pole of right kidney 7. New focal area of hyperenhancement within the left posterior prostate gland, indeterminate for a prostate mass. 8. Diffuse skeletal metastatic disease. Interval enlargement of multiple lesions involving the spine and pelvis. Aortic Atherosclerosis (ICD10-I70.0). Electronically Signed   By: Donavan Foil M.D.   On: 04/12/2020 23:49    My personal review of EKG: Rhythm NSR, Rate 74 /min, QTc 450 ,no Acute ST changes, LVH, Non specific T wave changes that were also present on June 16th EKG.    Assessment & Plan:    Active Problems:   CHF exacerbation ([place])     1. Hyponatremia 1. Volume overloaded status 2. Continue lasix 3. Chronically low at 125 - 130 4. Today Na 120 5. Trend BMP 2. CHF exacerbation systolic 1. Last echo April 2021-ejection fraction 45 to 50% 2. Shortness of breath, orthopnea 3. BNP 4400 4. Peripheral edema 5. Pleural effusions on chest x-ray 6. Continue beta-blocker, Lasix, statin, aspirin 7. Start ACE in the  morning 8. Monitor daily weights 9. Monitor intake and output 10. Low-sodium diet 11. Fluid restrictions 12. Continue to monitor on telemetry 3. Pleural effusions 1. Secondary to CHF exacerbation see plan above 4. Transaminitis 1. AST 76, ALT 216, elevated alk phos as well 2. Likely secondary to congestion secondary to CHF exacerbation 3. CT abdomen pending 4. Continue to monitor 5. Thrombocytopenia 1. But slowly trending down over the month 2. July 8th -228, July 15 -125, today platelets are 39 3. Likely due to chemotherapy 4. Last chemo was 2 weeks ago -next chemo due in 4 weeks 5. Continue to trend 6. Hypomagnesemia 1. Last Mag 1.3 7/15 2. Recheck today 3. Replace as indicated 7. CAD 1. Continue dual antiplatelet therapy, statin, beta-blocker, ACE 2. Continue to monitor 8. Anxiety 1. Patient complains of anxiety attacks -but some of the symptoms are more consistent with CHF exacerbation 2. Continue SSRI, BuSpar 3. Start as needed Ativan 4. Continue to monitor   DVT Prophylaxis-   Heparin - SCDs   AM Labs Ordered, also please review Full Orders  Family Communication: No family at bedside Code Status:  Full  Admission status: Inpatient :The appropriate admission status for this patient is INPATIENT. Inpatient status is judged to be reasonable and necessary in order to provide the required intensity of service to ensure the patient's safety. The patient's presenting symptoms, physical exam findings, and initial radiographic and laboratory data in the context of their chronic comorbidities is felt to place them at high risk for further clinical deterioration. Furthermore, it is not anticipated that the patient will be medically stable for discharge from the hospital within 2 midnights of admission. The following factors support the admission status of inpatient.     The patient's presenting symptoms include shortness of breath, orthopnea, peripheral edema The worrisome  physical exam findings include Peripheral edema, wheeze. The initial radiographic and laboratory data are worrisome because of Pleural effusions, BNP 0160         I certify that at the point of admission it is my clinical judgment that the patient will require inpatient hospital care spanning beyond 2 midnights from the point of admission due to high intensity of service, high risk for further deterioration and high frequency of surveillance required.  Time spent in minutes : [place]

## 2020-04-14 DIAGNOSIS — I25119 Atherosclerotic heart disease of native coronary artery with unspecified angina pectoris: Secondary | ICD-10-CM

## 2020-04-14 DIAGNOSIS — C678 Malignant neoplasm of overlapping sites of bladder: Secondary | ICD-10-CM

## 2020-04-14 DIAGNOSIS — Z952 Presence of prosthetic heart valve: Secondary | ICD-10-CM

## 2020-04-14 DIAGNOSIS — C7910 Secondary malignant neoplasm of unspecified urinary organs: Secondary | ICD-10-CM

## 2020-04-14 DIAGNOSIS — I700 Atherosclerosis of aorta: Secondary | ICD-10-CM | POA: Diagnosis not present

## 2020-04-14 DIAGNOSIS — R634 Abnormal weight loss: Secondary | ICD-10-CM

## 2020-04-14 DIAGNOSIS — I5023 Acute on chronic systolic (congestive) heart failure: Secondary | ICD-10-CM | POA: Diagnosis not present

## 2020-04-14 LAB — BASIC METABOLIC PANEL
Anion gap: 10 (ref 5–15)
BUN: 23 mg/dL (ref 8–23)
CO2: 21 mmol/L — ABNORMAL LOW (ref 22–32)
Calcium: 7.7 mg/dL — ABNORMAL LOW (ref 8.9–10.3)
Chloride: 91 mmol/L — ABNORMAL LOW (ref 98–111)
Creatinine, Ser: 0.97 mg/dL (ref 0.61–1.24)
GFR calc Af Amer: >60 mL/min (ref >60)
GFR calc non Af Amer: >60 mL/min (ref >60)
Glucose, Bld: 88 mg/dL (ref 70–99)
Potassium: 3.9 mmol/L (ref 3.5–5.1)
Sodium: 122 mmol/L — ABNORMAL LOW (ref 135–145)

## 2020-04-14 LAB — BRAIN NATRIURETIC PEPTIDE: B Natriuretic Peptide: 3295 pg/mL — ABNORMAL HIGH (ref 0.0–100.0)

## 2020-04-14 LAB — MAGNESIUM: Magnesium: 1.7 mg/dL (ref 1.7–2.4)

## 2020-04-14 NOTE — Progress Notes (Signed)
PROGRESS NOTE   [name]  HUD:[id number] DOB: July 29, 1951 DOA: 04/12/2020 PCP: [name], MD   Chief Complaint  Patient presents with  . Multiple Compliants    Brief Admission History:  *69 y.o. male, with history of recently diagnosed renal cancer, peripheral vascular disease, history of DVT, hypertension, CAD, depression, and more presents to the ED with a chief complaint of panic attacks. Patient reports that he has had increased anxiety attacks over the last week. Patient reports that he has had 2 anxiety attacks today, but he has had up to 4 anxiety attacks in 1 day. He reports that they happen during all times of the day. He wakes up in the middle of the night gasping for air with chest pains and palpitations. I believe the these middle of the night events are orthopnea. Patient reports that he has peripheral edema. He has dyspnea. He describes that his dyspnea is worse with exertion. He reports that he can only walk 15 feet before he feels like he is going to pass out.  He was admitted with acute heart failure with reduced EF and diastolic dysfunction.    Assessment & Plan:   Principal Problem:   Acute on chronic HFrEF (heart failure with reduced ejection fraction) -combined systolic and diastolic dysfunction CHF Active Problems:   S/P aortic valve replacement   Coronary atherosclerosis of native coronary artery   Aortic atherosclerosis (HCC)   Claudication in peripheral vascular disease (HCC)   Weight loss, non-intentional   Metastatic urothelial carcinoma (HCC)   Malignant neoplasm of overlapping sites of bladder (HCC)   CHF exacerbation (HCC)   Transaminitis   1. Acute combined systolic and diastolic heart failure - continue IV lasix diuresis, his BNP is trending down, he is feeling better, continue current management. Follow I/O and weights.  2. Chronic hyponatremia - multifactorial given active cancer (SIADH), volume overload (CHF), poor oral intake.   Follow BMP.  Restrict free water.  3. Metastatic urothelial carcinoma / bladder cancer - he is being treated by Dr Delton Coombes, he has bilateral nephrostomy tubes in place, recently changed out 2 weeks ago.   4. Thrombocytopenia - likely from chemotherapy, check CBC in AM.  No active bleeding.  5. CAD -stable.   DVT prophylaxis: SCD Code Status:  Full  Family Communication:  Disposition:   Status is: Inpatient  Remains inpatient appropriate because:IV treatments appropriate due to intensity of illness or inability to take PO and Inpatient level of care appropriate due to severity of illness  Dispo: The patient is from: Home              Anticipated d/c is to: Home              Anticipated d/c date is: 2 days              Patient currently is not medically stable to d/c.  Consultants:   n/a  Procedures:   n/a  Antimicrobials:  n/a   Subjective: Patient reports that he is weak on his feet.  The swelling in his legs is slowly going down.  He is changing his nephrostomy bags more frequently after Lasix is given.  His shortness of breath is much better.  He denies having chest pain.  Objective: Vitals:   04/13/20 2329 04/14/20 0335 04/14/20 0425 04/14/20 1300  BP: (!) 102/63 112/68  (!) 109/63  Pulse: 55 62  61  Resp: 18   16  Temp: 98 F (36.7 C) 98  F (36.7 C)  97.6 F (36.4 C)  TempSrc: Oral Oral  Oral  SpO2: 100% 95%  99%  Weight:   61 kg   Height:        Intake/Output Summary (Last 24 hours) at 04/14/2020 1648 Last data filed at 04/14/2020 1300 Gross per 24 hour  Intake 840 ml  Output 750 ml  Net 90 ml   Filed Weights   04/12/20 2018 04/13/20 1541 04/14/20 0425  Weight: 56.7 kg 59.9 kg 61 kg    Examination:  General exam: Appears calm and comfortable  Respiratory system: Clear to auscultation. Respiratory effort normal. Cardiovascular system: normal S1 & S2 heard, RRR. No JVD, murmurs, rubs, gallops or clicks. No pedal edema. Gastrointestinal system:  Abdomen is nondistended, soft and nontender. No organomegaly or masses felt. Normal bowel sounds heard. GU: bilateral nephrostomy drains in place, draining clear yellow urine.  Central nervous system: Alert and oriented. No focal neurological deficits. Extremities: Symmetric 5 x 5 power. Skin: No rashes, lesions or ulcers Psychiatry: Judgement and insight appear normal. Mood & affect appropriate.   Data Reviewed: I have personally reviewed following labs and imaging studies  CBC: Recent Labs  Lab 04/12/20 2050 04/13/20 0544  WBC 4.3 4.1  NEUTROABS 3.0 2.6  HGB 9.4* 9.4*  HCT 29.2* 28.5*  MCV 78.3* 77.4*  PLT 39* 46*    Basic Metabolic Panel: Recent Labs  Lab 04/12/20 2050 04/13/20 0045 04/13/20 0544 04/13/20 1221 04/14/20 0610  NA 120* 122* 124* 124* 122*  K 4.5 4.2 4.0 3.8 3.9  CL 88* 89* 89* 89* 91*  CO2 18* 21* 22 22 21*  GLUCOSE 99 87 111* 110* 88  BUN 18 19 19 19 23   CREATININE 0.96 0.89 0.93 0.99 0.97  CALCIUM 8.2* 8.2* 8.2* 8.1* 7.7*  MG 1.8  --  1.7  --  1.7    GFR: Estimated Creatinine Clearance: 61 mL/min (by C-G formula based on SCr of 0.97 mg/dL).  Liver Function Tests: Recent Labs  Lab 04/12/20 2050 04/13/20 0544  AST 76* 65*  ALT 216* 186*  ALKPHOS 370* 346*  BILITOT 1.0 0.9  PROT 6.5 6.0*  ALBUMIN 3.3* 3.1*    CBG: Recent Labs  Lab 04/13/20 1140  GLUCAP 127*    Recent Results (from the past 240 hour(s))  Urine Culture     Status: Abnormal (Preliminary result)   Collection Time: 04/12/20 10:25 PM   Specimen: Urine, Clean Catch  Result Value Ref Range Status   Specimen Description   Final    URINE, CLEAN CATCH Performed at [facility], [address], [place], [place] [address]    Special Requests   Final    NONE Performed at [facility], [address] [place], [place] [address]    Culture (A)  Final    >=100,000 COLONIES/mL ACINETOBACTER SPECIES >=100,000 COLONIES/mL ENTEROCOCCUS FAECALIS    Report Status PENDING   Incomplete  SARS Coronavirus 2 by RT PCR (hospital order, performed in [place] hospital lab) Nasopharyngeal Nasopharyngeal Swab     Status: None   Collection Time: 04/12/20 10:48 PM   Specimen: Nasopharyngeal Swab  Result Value Ref Range Status   SARS Coronavirus 2 NEGATIVE NEGATIVE Final    Comment: (NOTE) SARS-CoV-2 target nucleic acids are NOT DETECTED.  The SARS-CoV-2 RNA is generally detectable in upper and lower respiratory specimens during the acute phase of infection. The lowest concentration of SARS-CoV-2 viral copies this assay can detect is 250 copies / mL. A negative result  does not preclude SARS-CoV-2 infection and should not be used as the sole basis for treatment or other patient management decisions.  A negative result may occur with improper specimen collection / handling, submission of specimen other than nasopharyngeal swab, presence of viral mutation(s) within the areas targeted by this assay, and inadequate number of viral copies (<250 copies / mL). A negative result must be combined with clinical observations, patient history, and epidemiological information.  Fact Sheet for Patients:   StrictlyIdeas.no  Fact Sheet for Healthcare Providers: BankingDealers.co.za  This test is not yet approved or  cleared by the [place] FDA and has been authorized for detection and/or diagnosis of SARS-CoV-2 by FDA under an Emergency Use Authorization (EUA).  This EUA will remain in effect (meaning this test can be used) for the duration of the COVID-19 declaration under Section 564(b)(1) of the Act, 21 U.S.C. section 360bbb-3(b)(1), unless the authorization is terminated or revoked sooner.  Performed at [facility], [address], [place], [place] [address]      Radiology Studies: DG Chest 2 View  Result Date: 04/12/2020 CLINICAL DATA:  Short of breath EXAM: CHEST - 2 VIEW COMPARISON:  03/06/2020, 02/12/2020  FINDINGS: Right-sided central venous port tip over the SVC. Post sternotomy changes with valve prostheses. Cardiomegaly. Small right greater than left pleural effusion. Scattered foci of sclerosis at the ribs and spine consistent with metastatic disease. Incompletely visualized pigtail catheter in the right upper quadrant. IMPRESSION: Cardiomegaly with small right greater than left pleural effusions. Scattered sclerotic foci overlying the ribs and spine, likely corresponding to known skeletal metastatic disease. Electronically Signed   By: Donavan Foil M.D.   On: 04/12/2020 21:47   CT ABDOMEN PELVIS W CONTRAST  Result Date: 04/12/2020 CLINICAL DATA:  Abdomen pain possible biliary obstruction bilateral leg swelling EXAM: CT ABDOMEN AND PELVIS WITH CONTRAST TECHNIQUE: Multidetector CT imaging of the abdomen and pelvis was performed using the standard protocol following bolus administration of intravenous contrast. CONTRAST:  132mL OMNIPAQUE IOHEXOL 300 MG/ML  SOLN COMPARISON:  CT 03/06/2020, 02/02/2020 FINDINGS: Lower chest: Lung bases demonstrate new small moderate right pleural effusion. Marked cardiomegaly with multi chamber enlargement. Hepatobiliary: Mild loculated fluid along the anterior aspect of the liver. No focal hepatic abnormality. No calcified gallstone or biliary dilatation. Embolization coils at the porta hepatis. Pancreas: Unremarkable. No pancreatic ductal dilatation or surrounding inflammatory changes. Spleen: Unchanged lobulated spleen with coarse calcification. Adrenals/Urinary Tract: Adrenal glands are within normal limits. Bilateral nephrostomy tubes remain in place. No significant hydronephrosis. Cortical scarring within both kidneys. Slightly dense exophytic lesion off the upper pole right kidney measuring 2.3 x 1.4 cm, previously 2.3 x 1.5 cm. Eccentric right bladder mass measures slightly smaller today, 7 cm AP x 5 cm transverse compared with 8.4 x 4.5 cm previously. Possible right  seminal vesicle tumor invasion as before. Stomach/Bowel: The stomach is decompressed. No dilated small bowel. No bowel wall thickening. Vascular/Lymphatic: Tortuous aorta with atherosclerosis. No aneurysm. Decreased retroperitoneal and pelvic adenopathy. For example left Peri aortic lymph node measures 8 mm compared with 11 mm previously, series 2, image number 19. Right external iliac node now measures 17 mm, compared with 3.7 cm previously. Reproductive: Prostate enlarged. 2.4 cm relative area of hyperenhancement within the left posterior gland. Other: No free air. Extensive subcutaneous edema consistent with anasarca. Large bilateral scrotal fluid collections. Musculoskeletal: Multiple sclerotic foci within the pelvic bones, spine and ribs consistent with osseous metastatic disease. Some of the lesions appear increased in size with more  dense sclerosis, for example left inferior pubic ramus lesion measures 22 mm compared with 20 mm previously. Increased size of sclerotic lesions within the lumbar vertebra. IMPRESSION: 1. Negative for calcified gallstone or biliary dilatation. 2. Extensive subcutaneous edema consistent with generalized anasarca with large bilateral scrotal fluid collections. Cardiomegaly with small moderate right pleural effusion. 3. Slight interval decrease in size of eccentric right bladder mass. Decreased retroperitoneal and pelvic adenopathy. New small amount loculated right perihepatic fluid. 4. Slight interval decrease in size of eccentric right bladder mass with possible invasion of right seminal vesicle. Decreased retroperitoneal and pelvic adenopathy compared to most recent prior. 5. Stable positioning of bilateral nephrostomy tubes without hydronephrosis 6. Stable slightly dense exophytic lesion off the upper pole of right kidney 7. New focal area of hyperenhancement within the left posterior prostate gland, indeterminate for a prostate mass. 8. Diffuse skeletal metastatic disease.  Interval enlargement of multiple lesions involving the spine and pelvis. Aortic Atherosclerosis (ICD10-I70.0). Electronically Signed   By: Donavan Foil M.D.   On: 04/12/2020 23:49   Scheduled Meds: . aspirin EC  81 mg Oral Daily  . atorvastatin  40 mg Oral Daily  . busPIRone  10 mg Oral BID  . clopidogrel  75 mg Oral Daily  . escitalopram  10 mg Oral Daily  . fluticasone  2 spray Each Nare Daily  . furosemide  40 mg Intravenous Q12H  . heparin  5,000 Units Subcutaneous Q8H  . lisinopril  5 mg Oral Daily  . metoprolol succinate  50 mg Oral Daily  . montelukast  10 mg Oral QHS  . pantoprazole  40 mg Oral QAC breakfast  . potassium chloride  20 mEq Oral Daily  . sodium chloride flush  3 mL Intravenous Q12H   Continuous Infusions: . sodium chloride       LOS: 2 days   Time spent: 30 mins   Markiya Keefe Wynetta Emery, MD How to contact the [facility] Attending or Consulting provider [place] or covering provider during after hours [place], for this patient?  1. Check the care team in [facility] and look for a) attending/consulting [facility] provider listed and b) the [facility] team listed 2. Log into [email or web address] and use [place]'s universal password to access. If you do not have the password, please contact the hospital operator. 3. Locate the [facility] provider you are looking for under [facility] and page to a number that you can be directly reached. 4. If you still have difficulty reaching the provider, please page the [facility] (Director on Call) for the Hospitalists listed on amion for assistance.  04/14/2020, 4:48 PM

## 2020-04-15 DIAGNOSIS — I25119 Atherosclerotic heart disease of native coronary artery with unspecified angina pectoris: Secondary | ICD-10-CM | POA: Diagnosis not present

## 2020-04-15 DIAGNOSIS — I700 Atherosclerosis of aorta: Secondary | ICD-10-CM | POA: Diagnosis not present

## 2020-04-15 DIAGNOSIS — C678 Malignant neoplasm of overlapping sites of bladder: Secondary | ICD-10-CM | POA: Diagnosis not present

## 2020-04-15 DIAGNOSIS — I5023 Acute on chronic systolic (congestive) heart failure: Secondary | ICD-10-CM | POA: Diagnosis not present

## 2020-04-15 LAB — URINE CULTURE: Culture: >=100000 — AB

## 2020-04-15 LAB — HEPATIC FUNCTION PANEL
ALT: 116 U/L — ABNORMAL HIGH (ref 0–44)
AST: 39 U/L (ref 15–41)
Albumin: 2.9 g/dL — ABNORMAL LOW (ref 3.5–5.0)
Alkaline Phosphatase: 329 U/L — ABNORMAL HIGH (ref 38–126)
Bilirubin, Direct: 0.2 mg/dL (ref 0.0–0.2)
Indirect Bilirubin: 0.5 mg/dL (ref 0.3–0.9)
Total Bilirubin: 0.7 mg/dL (ref 0.3–1.2)
Total Protein: 5.6 g/dL — ABNORMAL LOW (ref 6.5–8.1)

## 2020-04-15 LAB — BASIC METABOLIC PANEL
Anion gap: 10 (ref 5–15)
BUN: 26 mg/dL — ABNORMAL HIGH (ref 8–23)
CO2: 22 mmol/L (ref 22–32)
Calcium: 7.5 mg/dL — ABNORMAL LOW (ref 8.9–10.3)
Chloride: 91 mmol/L — ABNORMAL LOW (ref 98–111)
Creatinine, Ser: 1.06 mg/dL (ref 0.61–1.24)
GFR calc Af Amer: >60 mL/min (ref >60)
GFR calc non Af Amer: >60 mL/min (ref >60)
Glucose, Bld: 96 mg/dL (ref 70–99)
Potassium: 4 mmol/L (ref 3.5–5.1)
Sodium: 123 mmol/L — ABNORMAL LOW (ref 135–145)

## 2020-04-15 LAB — CBC
HCT: 27.1 % — ABNORMAL LOW (ref 39.0–52.0)
Hemoglobin: 8.6 g/dL — ABNORMAL LOW (ref 13.0–17.0)
MCH: 25.4 pg — ABNORMAL LOW (ref 26.0–34.0)
MCHC: 31.7 g/dL (ref 30.0–36.0)
MCV: 79.9 fL — ABNORMAL LOW (ref 80.0–100.0)
Platelets: 77 10*3/uL — ABNORMAL LOW (ref 150–400)
RBC: 3.39 MIL/uL — ABNORMAL LOW (ref 4.22–5.81)
RDW: 25.7 % — ABNORMAL HIGH (ref 11.5–15.5)
WBC: 5.7 10*3/uL (ref 4.0–10.5)
nRBC: 0.4 % — ABNORMAL HIGH (ref 0.0–0.2)

## 2020-04-15 LAB — MAGNESIUM: Magnesium: 1.7 mg/dL (ref 1.7–2.4)

## 2020-04-15 LAB — BRAIN NATRIURETIC PEPTIDE: B Natriuretic Peptide: 1967 pg/mL — ABNORMAL HIGH (ref 0.0–100.0)

## 2020-04-15 MED ORDER — POTASSIUM CHLORIDE CRYS ER 10 MEQ PO TBCR: 10.0000 meq | EXTENDED_RELEASE_TABLET | Freq: Every day | ORAL | 0 refills | Status: DC

## 2020-04-15 MED ORDER — FUROSEMIDE 40 MG PO TABS: 40.0000 mg | ORAL_TABLET | Freq: Two times a day (BID) | ORAL | 0 refills | Status: DC

## 2020-04-15 MED ORDER — AMOXICILLIN-POT CLAVULANATE 875-125 MG PO TABS
1.0000 | ORAL_TABLET | Freq: Two times a day (BID) | ORAL | Status: DC
  Administered 2020-04-15 – 2020-04-17 (×5): 1 via ORAL
  Filled 2020-04-15 (×5): qty 1

## 2020-04-15 MED ORDER — LISINOPRIL 5 MG PO TABS: 5.0000 mg | ORAL_TABLET | Freq: Every day | ORAL | 1 refills | Status: DC

## 2020-04-15 MED ORDER — AMOXICILLIN-POT CLAVULANATE 875-125 MG PO TABS: 1.0000 | ORAL_TABLET | Freq: Two times a day (BID) | ORAL | 0 refills | Status: DC

## 2020-04-15 MED ORDER — OXYCODONE HCL 5 MG PO TABS
5.0000 mg | ORAL_TABLET | ORAL | Status: AC | PRN
  Administered 2020-04-15 (×3): 5 mg via ORAL
  Filled 2020-04-15 (×3): qty 1

## 2020-04-15 NOTE — Progress Notes (Deleted)
Cardiology Office Note    Date:  04/15/2020   ID:  Rohn, Fritsch Nov 04, 1950, MRN [id number]  PCP:  [name], MD  Cardiologist: Rozann Lesches, MD EPS: None  No chief complaint on file.   History of Present Illness:  [name] is a 69 y.o. male with history of CAD status post BMS to the RCA in 2014, bioprosthetic aortic valve replacement and ultimate valve in valve TAVR due to severe aortic regurgitation in the setting of torn prosthetic cusp.  Recent echo stable valve gradient with moderate perivalvular regurgitation.  Secondary cardiomyopathy LVEF 45 to 50% on echo 01/05/2020  Patient discharged 04/15/2020 after having multiple panic attacks.  He was waking in the Middle of the night with chest pain palpitations and gasping for air.  He also had dyspnea on exertion.  Felt he had acute on chronic combined systolic and diastolic CHF and was diuresed with IV Lasix and home dose increased to 40 mg twice daily.  He also has chronic hyponatremia multifactorial given recent renal cancer diagnosis SIADH, volume overload poor oral intake.  He also had a UTI.  Past Medical History:  Diagnosis Date  . Arterial embolus of lower extremity ([place]) 11/23/2013  . Arthritis   . CAD (coronary artery disease)    a. s/p PCI with 3.5 x 18 BMS to mid RCA, non obst LAD, Lcx patent 08/10/13  . Cancer ([place]) 2021   bladder  . Depression, major, single episode, mild ([place]) 12/20/2019  . ED (erectile dysfunction) 03/27/2015  . Embolus of femoral artery [facility]) 2012   Surgical repair, previously on Coumadin  . Essential hypertension   . ETOH abuse   . Hematuria 10/17/2019  . Hernia of abdominal cavity 02/27/2019  . History of DVT (deep vein thrombosis) 2011  . History of GI bleed    February 2012   . Ingrown left greater toenail 09/04/2018  . 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 Low oxygen saturation 01/31/2020  . 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 Pain with urination 10/17/2019  . Port-A-Cath in place 02/23/2020  . Prosthetic valve endocarditis ([place])   . PVD (peripheral vascular disease) ([place])   . S/P aortic valve replacement    Details not clear -  reportedly 1971 per patient    Past Surgical History:  Procedure Laterality Date  . ABDOMINAL AORTOGRAM W/LOWER EXTREMITY N/A 10/31/2018   Procedure: ABDOMINAL AORTOGRAM W/LOWER EXTREMITY;  Surgeon: Lorretta Harp, MD;  Location: [place] CV LAB;  Service: Cardiovascular;  Laterality: N/A;  . Aoric valve replacement  January 2005   64mm Medtronic Freestyle stentless porcine bioprosthesis [facility])  . CARDIAC CATHETERIZATION     30-40% mid LAD, 20-30% ostial D1, proximal 30-40% RCA, 95% mid RCA thrombus, 40% distal stenosis. EF 45%, inferior HK.  [name] COLONOSCOPY N/A 04/20/2014   Procedure: COLONOSCOPY;  Surgeon: Rogene Houston, MD;  [facility];  Service: Endoscopy;  Laterality: N/A;  930  . COLONOSCOPY WITH PROPOFOL N/A 02/02/2020   Procedure: COLONOSCOPY WITH PROPOFOL;  Surgeon: Rogene Houston, MD;  [facility];  Service: Endoscopy;  Laterality: N/A;  . CORONARY ANGIOPLASTY WITH STENT PLACEMENT  07/2013  . ESOPHAGOGASTRODUODENOSCOPY (EGD) WITH PROPOFOL N/A 02/02/2020   Procedure: ESOPHAGOGASTRODUODENOSCOPY (EGD) WITH PROPOFOL;  Surgeon: Rogene Houston, MD;  [facility];  Service: Endoscopy;  Laterality: N/A;  .  INGUINAL HERNIA REPAIR Bilateral 04/17/2019   Procedure: ATTEMPTED LAPAROSCOPIC BILATERAL INGUINAL HERNIA REPAIR;  Surgeon: Coralie Keens, MD;  Location: [place];  Service: General;  Laterality: Bilateral;  . INGUINAL HERNIA REPAIR Bilateral 04/17/2019   Procedure: Hernia Repair Inguinal Adult;  Surgeon: Coralie Keens, MD;  Location: [place];  Service: General;  Laterality: Bilateral;  . IR IMAGING GUIDED PORT INSERTION  02/22/2020  . IR NEPHROSTOMY EXCHANGE LEFT  04/05/2020  . IR NEPHROSTOMY EXCHANGE  RIGHT  04/05/2020  . IR NEPHROSTOMY PLACEMENT LEFT  02/22/2020  . IR NEPHROSTOMY PLACEMENT RIGHT  02/22/2020  . LEFT HEART CATHETERIZATION WITH CORONARY ANGIOGRAM N/A 08/10/2013   Procedure: LEFT HEART CATHETERIZATION WITH CORONARY ANGIOGRAM;  Surgeon: Blane Ohara, MD;  Location: [facility] CATH LAB;  Service: Cardiovascular;  Laterality: N/A;  . Left leg embolectomy  May 2012   Dr. Trula Slade - reason for lifelong coumadin  . LYMPH NODE BIOPSY Left 11/21/2019   Procedure: Left Neck LYMPH NODE BIOPSY;  Surgeon: Rozetta Nunnery, MD;  Location: [place];  Service: ENT;  Laterality: Left;  . PERIPHERAL VASCULAR ATHERECTOMY  10/31/2018   Procedure: PERIPHERAL VASCULAR ATHERECTOMY;  Surgeon: Lorretta Harp, MD;  Location: [place] CV LAB;  Service: Cardiovascular;;  left SFA  . POLYPECTOMY  02/02/2020   Procedure: POLYPECTOMY;  Surgeon: Rogene Houston, MD;  [facility];  Service: Endoscopy;;  splenic flexure    Current Medications: No outpatient medications have been marked as taking for the 04/22/20 encounter (Appointment) with Imogene Burn, PA-C.     Allergies:   Patient has no known allergies.   Social History   Socioeconomic History  . Marital status: Single    Spouse name: Not on file  . Number of children: 2  . Years of education: Not on file  . Highest education level: 11th grade  Occupational History  . Occupation: disability  Tobacco Use  . Smoking status: Former Smoker    Packs/day: 0.50    Years: 3.00    Pack years: 1.50    Types: Cigarettes    Start date: 10/12/1965    Quit date: 02/19/1970    Years since quitting: 50.1  . Smokeless tobacco: Never Used  Vaping Use  . Vaping Use: Never used  Substance and Sexual Activity  . Alcohol use: Not Currently  . Drug use: Yes    Types: Marijuana    Comment: pt smokes weed once in the morning and once in the evening  . Sexual activity: Not Currently  Other Topics Concern  . Not on file  Social History Narrative    . Not on file   Social Determinants of Health   Financial Resource Strain:   . Difficulty of Paying Living Expenses:   Food Insecurity:   . Worried About Charity fundraiser in the Last Year:   . Arboriculturist in the Last Year:   Transportation Needs:   . Film/video editor (Medical):   [name] Lack of Transportation (Non-Medical):   Physical Activity:   . Days of Exercise per Week:   . Minutes of Exercise per Session:   Stress:   . Feeling of Stress :   Social Connections:   . Frequency of Communication with Friends and Family:   . Frequency of Social Gatherings with Friends and Family:   . Attends Religious Services:   . Active Member of Clubs or Organizations:   . Attends Archivist Meetings:   [name] Marital Status:  Family History:  The patient's ***family history includes COPD in his sister; Cirrhosis in his father; Diabetes in his mother.   ROS:   Please see the history of present illness.    ROS All other systems reviewed and are negative.   PHYSICAL EXAM:   VS:  There were no vitals taken for this visit.  Physical Exam  GEN: Well nourished, well developed, in no acute distress  HEENT: normal  Neck: no JVD, carotid bruits, or masses Cardiac:RRR; no murmurs, rubs, or gallops  Respiratory:  clear to auscultation bilaterally, normal work of breathing GI: soft, nontender, nondistended, + BS Ext: without cyanosis, clubbing, or edema, Good distal pulses bilaterally MS: no deformity or atrophy  Skin: warm and dry, no rash Neuro:  Alert and Oriented x 3, Strength and sensation are intact Psych: euthymic mood, full affect  Wt Readings from Last 3 Encounters:  04/15/20 132 lb 4.4 oz (60 kg)  04/04/20 120 lb 12.8 oz (54.8 kg)  04/01/20 125 lb 11.2 oz (57 kg)      Studies/Labs Reviewed:   EKG:  EKG is*** ordered today.  The ekg ordered today demonstrates ***  Recent Labs: 04/13/2020: TSH 2.575 04/15/2020: ALT 116; B Natriuretic Peptide 1,967.0; BUN  26; Creatinine, Ser 1.06; Hemoglobin 8.6; Magnesium 1.7; Platelets 77; Potassium 4.0; Sodium 123   Lipid Panel    Component Value Date/Time   CHOL 112 07/06/2019 0843   TRIG 52 07/06/2019 0843   HDL 42 07/06/2019 0843   CHOLHDL 2.7 07/06/2019 0843   VLDL 12 12/30/2016 0853   LDLCALC 57 07/06/2019 0843    Additional studies/ records that were reviewed today include:  Echocardiogram 01/05/2020:  1. Left ventricular ejection fraction, by estimation, is 45 to 50%. The  left ventricle has mildly decreased function. The left ventricle  demonstrates regional wall motion abnormalities (see scoring  diagram/findings for description). The left ventricular   internal cavity size was mildly dilated. There is mild left ventricular  hypertrophy. Left ventricular diastolic parameters are consistent with  Grade I diastolic dysfunction (impaired relaxation).   2. Right ventricular systolic function is normal. The right ventricular  size is normal. There is mildly elevated pulmonary artery systolic  pressure. The estimated right ventricular systolic pressure is 24.2 mmHg.   3. Left atrial size was severely dilated.   4. The mitral valve is grossly normal. Trivial mitral valve  regurgitation.   5. The aortic valve has been repaired/replaced. History of bioprosthetic  AVR with valve in valve TAVR, 26 mm Sapien 3 prosthesis. There is a  moderate perivalvular leak at 9 o'clock. Aortic valve regurgitation is  mild. Aortic valve mean gradient measures   13.7 mmHg.   6. The inferior vena cava is normal in size with greater than 50%  respiratory variability, suggesting right atrial pressure of 3 mmHg.        ASSESSMENT:    1. Secondary cardiomyopathy ([place])   2. Aortic valve disease   3. Coronary artery disease involving native coronary artery of native heart without angina pectoris   4. Metastatic urothelial carcinoma (HCC)      PLAN:  In order of problems listed above:  Secondary  cardiomyopathy ejection fraction 45 to 50% on echo 12/2019.  Recent hospitalization for acute on chronic combined systolic and diastolic CHF treated with IV Lasix and home dose increased to 40 mg twice daily  Complex aortic valve disease status post bioprosthetic AVR and ultimately valve in valve TAVR due to severe aortic regurgitation in  the setting of a torn prosthetic cusp.  Most recent echo stable valve gradient with moderate perivalvular regurgitation 12/2019  CAD status post BMS to the RCA in 2014  Metastatic urothelial/bladder carcinoma with hyponatremia    Medication Adjustments/Labs and Tests Ordered: Current medicines are reviewed at length with the patient today.  Concerns regarding medicines are outlined above.  Medication changes, Labs and Tests ordered today are listed in the Patient Instructions below. There are no Patient Instructions on file for this visit.   Sumner Boast, PA-C  04/15/2020 3:58 PM    [facility] [place], [place], [place]  [address] Phone: [phone]; Fax: [phone]

## 2020-04-15 NOTE — Discharge Summary (Addendum)
Physician Discharge Summary  [name] XUX:[id number] DOB: 1950-12-18 DOA: 04/12/2020  PCP: [name], MD  Admit date: 04/12/2020 Discharge date: 04/15/2020  Admitted From:  Home Disposition:  Home with Home Health   Recommendations for Outpatient Follow-up:  1. Follow up with oncologist in 2 weeks 2. Follow up with cardiology 3. Follow up with PCP 4. Please obtain BMP/CBC in 1-2 weeks to check electrolytes and platelets  Home Health: PT, Aide, RN  Discharge Condition: STABLE   CODE STATUS: FULL    Brief Hospitalization Summary: Please see all hospital notes, images, labs for full details of the hospitalization. ADMISSION HPI:  [name]  is a 69 y.o. male, with history of recently diagnosed renal cancer, peripheral vascular disease, history of DVT, hypertension, CAD, depression, and more presents to the ED with a chief complaint of panic attacks. Patient reports that he has had increased anxiety attacks over the last week. Patient reports that he has had 2 anxiety attacks today, but he has had up to 4 anxiety attacks in 1 day. He reports that they happen during all times of the day. He wakes up in the middle of the night gasping for air with chest pains and palpitations. I believe the these middle of the night events are orthopnea. Patient reports that he has peripheral edema. He has dyspnea. He describes that his dyspnea is worse with exertion. He reports that he can only walk 15 feet before he feels like he is going to pass out. He has not passed out. Patient reports that he has intermittent chest pain. He thought it was heartburn or indigestion at first, but it did not go away. Anxiety makes the chest pain worse. She has associated palpitations. Patient reports that he has a cough productive of clear sputum. He has had no fevers. He has no sick contacts. He has had Covid vaccine. His last chemo was 2 weeks ago. Allergy told him his next treatment will be in 6  weeks.  ED highlights Temperature 96.5, heart rate 71, blood pressure 151/76, respiratory rate 19, O2 100% White blood cell 4.3, hemoglobin 9.4, platelets 39 CHEM panel shows hyponatremia at 120 Alk phos 370, AST 76, ALT is 216 Chest x-ray shows cardiomegaly with right greater than left pleural effusions, skeletal metastatic disease CT abdomen done and read is pending Nasal cannula applied for comfort  Brief Admission History:  *69 y.o.male,with history of recently diagnosed renal cancer,peripheral vascular disease,history of DVT,hypertension, CAD, depression, and more presents to the ED with a chief complaint of panic attacks. Patient reports that he has had increased anxiety attacks over the last week. Patient reports that he has had 2 anxiety attacks today, but he has had up to 4 anxiety attacks in 1 day. He reports that they happen during all times of the day. He wakes up in the middle of the night gasping for air with chest pains and palpitations. I believe the these middle of the night events are orthopnea. Patient reports that he has peripheral edema. He has dyspnea. He describes that his dyspnea is worse with exertion. He reports that he can only walk 15 feet before he feels like he is going to pass out.  He was admitted with acute heart failure with reduced EF and diastolic dysfunction.   Assessment & Plan:   Principal Problem:   Acute on chronic HFrEF (heart failure with reduced ejection fraction) -combined systolic and diastolic dysfunction CHF Active Problems:   S/P aortic valve replacement  Coronary atherosclerosis of native coronary artery   Aortic atherosclerosis (HCC)   Claudication in peripheral vascular disease (HCC)   Weight loss, non-intentional   Metastatic urothelial carcinoma (HCC)   Malignant neoplasm of overlapping sites of bladder (HCC)   CHF exacerbation (HCC)   Transaminitis  1. Acute combined systolic and diastolic heart failure - Pt was treated  with IV lasix with good diuresis, his BNP is trending down, he is feeling better, he is stable to discharge home on oral lasix. We increased his home dose to oral lasix 40 mg BID.   We followed I/O and weights as evidence of adequate diuresis.   2. Chronic hyponatremia - multifactorial given active cancer (SIADH), volume overload (CHF), poor oral intake.  Follow BMP.  Restrict free water.  Sodium is at baseline.  3. UTI: urine culture positive for enterococcus and actineobacter sensitive to ampicillin/augmentin. DC on augmentin 875 mg BID x 5 days.  4. Metastatic urothelial carcinoma / bladder cancer - he is being treated by Dr Delton Coombes, he has bilateral nephrostomy tubes in place, recently changed out 2 weeks ago.  Follow up with oncology center.  Ambulatory referral order made.  5. Thrombocytopenia -  from chemotherapy, No active bleeding. Follow up with Dr. Delton Coombes for CBC recheck.  6. CAD -stable.   DVT prophylaxis: SCD Code Status:  Full  Family Communication:  Disposition:   Discharge Diagnoses:  Principal Problem:   Acute on chronic HFrEF (heart failure with reduced ejection fraction) -combined systolic and diastolic dysfunction CHF Active Problems:   S/P aortic valve replacement   Coronary atherosclerosis of native coronary artery   Aortic atherosclerosis (HCC)   Claudication in peripheral vascular disease (HCC)   Weight loss, non-intentional   Metastatic urothelial carcinoma ([place])   Malignant neoplasm of overlapping sites of bladder ([place])   CHF exacerbation ([place])   Transaminitis  Discharge Instructions: Discharge Instructions    Ambulatory referral to Cardiology   Complete by: As directed    Ambulatory referral to Oncology   Complete by: As directed    Hospital follow up     Allergies as of 04/16/2020   No Known Allergies     Medication List    STOP taking these medications   lidocaine-prilocaine cream Commonly known as: EMLA     TAKE these medications    amoxicillin-clavulanate 875-125 MG tablet Commonly known as: AUGMENTIN Take 1 tablet by mouth every 12 (twelve) hours for 5 days.   aspirin EC 81 MG tablet Take 1 tablet (81 mg total) by mouth daily.   atorvastatin 40 MG tablet Commonly known as: LIPITOR Take 1 tablet (40 mg total) by mouth daily.   busPIRone 10 MG tablet Commonly known as: BUSPAR Take 1 tablet (10 mg total) by mouth 2 (two) times daily.   cholecalciferol 25 MCG (1000 UNIT) tablet Commonly known as: VITAMIN D Take 1 tablet (1,000 Units total) by mouth daily.   clonazePAM 0.5 MG tablet Commonly known as: KlonoPIN Take 1 tablet (0.5 mg total) by mouth at bedtime.   clopidogrel 75 MG tablet Commonly known as: PLAVIX Take 1 tablet by mouth once daily   escitalopram 10 MG tablet Commonly known as: LEXAPRO Take 1 tablet (10 mg total) by mouth daily.   fluticasone 50 MCG/ACT nasal spray Commonly known as: FLONASE Use 2 spray(s) in each nostril once daily What changed:   how much to take  how to take this  when to take this  additional instructions   furosemide 40 MG  tablet Commonly known as: LASIX Take 1 tablet (40 mg total) by mouth 2 (two) times daily. What changed: additional instructions   lisinopril 5 MG tablet Commonly known as: ZESTRIL Take 1 tablet (5 mg total) by mouth daily.   magnesium oxide 400 (241.3 Mg) MG tablet Commonly known as: MAG-OX Take 1 tablet (400 mg total) by mouth 2 (two) times daily.   metoprolol succinate 50 MG 24 hr tablet Commonly known as: TOPROL-XL TAKE 1 TABLET BY MOUTH ONCE DAILY WITH  OR  IMMEDIATELY  FOLLOWING  A  MEAL What changed:   how much to take  how to take this  when to take this  additional instructions   montelukast 10 MG tablet Commonly known as: SINGULAIR Take 1 tablet (10 mg total) by mouth at bedtime.   nitroGLYCERIN 0.4 MG SL tablet Commonly known as: NITROSTAT Place 1 tablet (0.4 mg total) under the tongue every 5 (five) minutes  as needed for chest pain.   pantoprazole 40 MG tablet Commonly known as: PROTONIX Take 1 tablet (40 mg total) by mouth daily before breakfast.   potassium chloride 10 MEQ tablet Commonly known as: KLOR-CON Take 1 tablet (10 mEq total) by mouth every other day. Start taking on: April 22, 2020   prochlorperazine 10 MG tablet Commonly known as: COMPAZINE Take 1 tablet (10 mg total) by mouth every 6 (six) hours as needed (Nausea or vomiting).   trolamine salicylate 10 % cream Commonly known as: ASPERCREME Apply 1 application topically daily as needed for muscle pain (for pain in feet). For pain in legs (swelling)            Durable Medical Equipment  (From admission, onward)         Start     Ordered   04/15/20 1055  For home use only DME 3 n 1  Once        04/15/20 1054   04/15/20 1054  For home use only DME Shower stool  Once        04/15/20 1053          Follow-up Information    [name], MD Follow up in 2 week(s).   Specialty: Family Medicine Contact information: [address] [place] [address] [phone]        Satira Sark, MD .   Specialty: Cardiology Contact information: [place] [address] [phone]        Derek Jack, MD. Schedule an appointment as soon as possible for a visit in 2 week(s).   Specialty: Hematology Contact information: [place] [address] [phone]              No Known Allergies Allergies as of 04/16/2020   No Known Allergies     Medication List    STOP taking these medications   lidocaine-prilocaine cream Commonly known as: EMLA     TAKE these medications   amoxicillin-clavulanate 875-125 MG tablet Commonly known as: AUGMENTIN Take 1 tablet by mouth every 12 (twelve) hours for 5 days.   aspirin EC 81 MG tablet Take 1 tablet (81 mg total) by mouth daily.   atorvastatin 40 MG tablet Commonly known as: LIPITOR Take 1 tablet  (40 mg total) by mouth daily.   busPIRone 10 MG tablet Commonly known as: BUSPAR Take 1 tablet (10 mg total) by mouth 2 (two) times daily.   cholecalciferol 25 MCG (1000 UNIT) tablet Commonly known as: VITAMIN  D Take 1 tablet (1,000 Units total) by mouth daily.   clonazePAM 0.5 MG tablet Commonly known as: KlonoPIN Take 1 tablet (0.5 mg total) by mouth at bedtime.   clopidogrel 75 MG tablet Commonly known as: PLAVIX Take 1 tablet by mouth once daily   escitalopram 10 MG tablet Commonly known as: LEXAPRO Take 1 tablet (10 mg total) by mouth daily.   fluticasone 50 MCG/ACT nasal spray Commonly known as: FLONASE Use 2 spray(s) in each nostril once daily What changed:   how much to take  how to take this  when to take this  additional instructions   furosemide 40 MG tablet Commonly known as: LASIX Take 1 tablet (40 mg total) by mouth 2 (two) times daily. What changed: additional instructions   lisinopril 5 MG tablet Commonly known as: ZESTRIL Take 1 tablet (5 mg total) by mouth daily.   magnesium oxide 400 (241.3 Mg) MG tablet Commonly known as: MAG-OX Take 1 tablet (400 mg total) by mouth 2 (two) times daily.   metoprolol succinate 50 MG 24 hr tablet Commonly known as: TOPROL-XL TAKE 1 TABLET BY MOUTH ONCE DAILY WITH  OR  IMMEDIATELY  FOLLOWING  A  MEAL What changed:   how much to take  how to take this  when to take this  additional instructions   montelukast 10 MG tablet Commonly known as: SINGULAIR Take 1 tablet (10 mg total) by mouth at bedtime.   nitroGLYCERIN 0.4 MG SL tablet Commonly known as: NITROSTAT Place 1 tablet (0.4 mg total) under the tongue every 5 (five) minutes as needed for chest pain.   pantoprazole 40 MG tablet Commonly known as: PROTONIX Take 1 tablet (40 mg total) by mouth daily before breakfast.   potassium chloride 10 MEQ tablet Commonly known as: KLOR-CON Take 1 tablet (10 mEq total) by mouth every other day. Start  taking on: April 22, 2020   prochlorperazine 10 MG tablet Commonly known as: COMPAZINE Take 1 tablet (10 mg total) by mouth every 6 (six) hours as needed (Nausea or vomiting).   trolamine salicylate 10 % cream Commonly known as: ASPERCREME Apply 1 application topically daily as needed for muscle pain (for pain in feet). For pain in legs (swelling)            Durable Medical Equipment  (From admission, onward)         Start     Ordered   04/15/20 1055  For home use only DME 3 n 1  Once        04/15/20 1054   04/15/20 1054  For home use only DME Shower stool  Once        04/15/20 1053          Procedures/Studies: DG Chest 2 View  Result Date: 04/12/2020 CLINICAL DATA:  Short of breath EXAM: CHEST - 2 VIEW COMPARISON:  03/06/2020, 02/12/2020 FINDINGS: Right-sided central venous port tip over the SVC. Post sternotomy changes with valve prostheses. Cardiomegaly. Small right greater than left pleural effusion. Scattered foci of sclerosis at the ribs and spine consistent with metastatic disease. Incompletely visualized pigtail catheter in the right upper quadrant. IMPRESSION: Cardiomegaly with small right greater than left pleural effusions. Scattered sclerotic foci overlying the ribs and spine, likely corresponding to known skeletal metastatic disease. Electronically Signed   By: Donavan Foil M.D.   On: 04/12/2020 21:47   CT ABDOMEN PELVIS W CONTRAST  Result Date: 04/12/2020 CLINICAL DATA:  Abdomen pain possible biliary obstruction bilateral  leg swelling EXAM: CT ABDOMEN AND PELVIS WITH CONTRAST TECHNIQUE: Multidetector CT imaging of the abdomen and pelvis was performed using the standard protocol following bolus administration of intravenous contrast. CONTRAST:  137m OMNIPAQUE IOHEXOL 300 MG/ML  SOLN COMPARISON:  CT 03/06/2020, 02/02/2020 FINDINGS: Lower chest: Lung bases demonstrate new small moderate right pleural effusion. Marked cardiomegaly with multi chamber enlargement.  Hepatobiliary: Mild loculated fluid along the anterior aspect of the liver. No focal hepatic abnormality. No calcified gallstone or biliary dilatation. Embolization coils at the porta hepatis. Pancreas: Unremarkable. No pancreatic ductal dilatation or surrounding inflammatory changes. Spleen: Unchanged lobulated spleen with coarse calcification. Adrenals/Urinary Tract: Adrenal glands are within normal limits. Bilateral nephrostomy tubes remain in place. No significant hydronephrosis. Cortical scarring within both kidneys. Slightly dense exophytic lesion off the upper pole right kidney measuring 2.3 x 1.4 cm, previously 2.3 x 1.5 cm. Eccentric right bladder mass measures slightly smaller today, 7 cm AP x 5 cm transverse compared with 8.4 x 4.5 cm previously. Possible right seminal vesicle tumor invasion as before. Stomach/Bowel: The stomach is decompressed. No dilated small bowel. No bowel wall thickening. Vascular/Lymphatic: Tortuous aorta with atherosclerosis. No aneurysm. Decreased retroperitoneal and pelvic adenopathy. For example left Peri aortic lymph node measures 8 mm compared with 11 mm previously, series 2, image number 19. Right external iliac node now measures 17 mm, compared with 3.7 cm previously. Reproductive: Prostate enlarged. 2.4 cm relative area of hyperenhancement within the left posterior gland. Other: No free air. Extensive subcutaneous edema consistent with anasarca. Large bilateral scrotal fluid collections. Musculoskeletal: Multiple sclerotic foci within the pelvic bones, spine and ribs consistent with osseous metastatic disease. Some of the lesions appear increased in size with more dense sclerosis, for example left inferior pubic ramus lesion measures 22 mm compared with 20 mm previously. Increased size of sclerotic lesions within the lumbar vertebra. IMPRESSION: 1. Negative for calcified gallstone or biliary dilatation. 2. Extensive subcutaneous edema consistent with generalized anasarca  with large bilateral scrotal fluid collections. Cardiomegaly with small moderate right pleural effusion. 3. Slight interval decrease in size of eccentric right bladder mass. Decreased retroperitoneal and pelvic adenopathy. New small amount loculated right perihepatic fluid. 4. Slight interval decrease in size of eccentric right bladder mass with possible invasion of right seminal vesicle. Decreased retroperitoneal and pelvic adenopathy compared to most recent prior. 5. Stable positioning of bilateral nephrostomy tubes without hydronephrosis 6. Stable slightly dense exophytic lesion off the upper pole of right kidney 7. New focal area of hyperenhancement within the left posterior prostate gland, indeterminate for a prostate mass. 8. Diffuse skeletal metastatic disease. Interval enlargement of multiple lesions involving the spine and pelvis. Aortic Atherosclerosis (ICD10-I70.0). Electronically Signed   By: KDonavan FoilM.D.   On: 04/12/2020 23:49   IR NEPHROSTOMY EXCHANGE LEFT  Result Date: 04/05/2020 CLINICAL DATA:  Metastatic urothelial cancer, status post percutaneous nephrostomy catheter placement 02/22/2020. Patient presents for his first scheduled nephrostomy exchange. No problems with the catheters thus far. EXAM: BILATERAL PERCUTANEOUS NEPHROSTOMY CATHETER EXCHANGE UNDER FLUOROSCOPY TECHNIQUE: The procedure, risks (including but not limited to bleeding, infection, organ damage ), benefits, and alternatives were explained to the patient. Questions regarding the procedure were encouraged and answered. The patient understands and consents to the procedure. The nephrostomy tubes and surrounding skin were prepped with Betadine, draped in usual sterile fashion. A small amount of contrast was injected through the right nephrostomy catheter to opacify the renal collecting system. The catheter was cut and exchanged over a 0.035" angiographic wire for  a new 10-French pigtail catheter, formed centrally within the  collecting system under fluoroscopy. Contrast injection confirms appropriate positioning. In a similar fashion, the left nephrostomy catheter was injected, cut, and exchanged for a new 10-French pigtail catheter, formed centrally within the left renal collecting system. Injection confirms appropriate positioning and patency. There is very little in the way of deposits or debris associated with the removed catheters. Both catheters were secured externally with 0 Prolene sutures. The patient tolerated the procedure well. FLUOROSCOPY TIME:  15 mGy; 1 minutes COMPLICATIONS: None immediate IMPRESSION: 1. Technically successful exchange of bilateral nephrostomy catheters under fluoroscopy. Follow-up 8 weeks. Electronically Signed   By: Lucrezia Europe M.D.   On: 04/05/2020 15:47   IR NEPHROSTOMY EXCHANGE RIGHT  Result Date: 04/05/2020 CLINICAL DATA:  Metastatic urothelial cancer, status post percutaneous nephrostomy catheter placement 02/22/2020. Patient presents for his first scheduled nephrostomy exchange. No problems with the catheters thus far. EXAM: BILATERAL PERCUTANEOUS NEPHROSTOMY CATHETER EXCHANGE UNDER FLUOROSCOPY TECHNIQUE: The procedure, risks (including but not limited to bleeding, infection, organ damage ), benefits, and alternatives were explained to the patient. Questions regarding the procedure were encouraged and answered. The patient understands and consents to the procedure. The nephrostomy tubes and surrounding skin were prepped with Betadine, draped in usual sterile fashion. A small amount of contrast was injected through the right nephrostomy catheter to opacify the renal collecting system. The catheter was cut and exchanged over a 0.035" angiographic wire for a new 10-French pigtail catheter, formed centrally within the collecting system under fluoroscopy. Contrast injection confirms appropriate positioning. In a similar fashion, the left nephrostomy catheter was injected, cut, and exchanged for a  new 10-French pigtail catheter, formed centrally within the left renal collecting system. Injection confirms appropriate positioning and patency. There is very little in the way of deposits or debris associated with the removed catheters. Both catheters were secured externally with 0 Prolene sutures. The patient tolerated the procedure well. FLUOROSCOPY TIME:  15 mGy; 1 minutes COMPLICATIONS: None immediate IMPRESSION: 1. Technically successful exchange of bilateral nephrostomy catheters under fluoroscopy. Follow-up 8 weeks. Electronically Signed   By: Lucrezia Europe M.D.   On: 04/05/2020 15:47     Subjective: Pt sitting up in bed eating breakfast, no complaints, no SOB, no chest pain.   Discharge Exam: Vitals:   04/15/20 2125 04/16/20 0525  BP: 99/66 107/74  Pulse: 60 71  Resp: 16 16  Temp: 98.3 F (36.8 C) 98.2 F (36.8 C)  SpO2: 98% 96%   Vitals:   04/15/20 1412 04/15/20 2125 04/16/20 0500 04/16/20 0525  BP: 112/72 99/66  107/74  Pulse:  60  71  Resp: '16 16  16  ' Temp: 98 F (36.7 C) 98.3 F (36.8 C)  98.2 F (36.8 C)  TempSrc:  Oral  Oral  SpO2: 99% 98%  96%  Weight:   60.7 kg   Height:       General exam: Appears calm and comfortable  Respiratory system: Clear to auscultation. Respiratory effort normal. Cardiovascular system: normal S1 & S2 heard, RRR. No JVD, murmurs, rubs, gallops or clicks. No pedal edema. Gastrointestinal system: Abdomen is nondistended, soft and nontender. No organomegaly or masses felt. Normal bowel sounds heard. GU: bilateral nephrostomy drains in place, draining clear yellow urine.  Central nervous system: Alert and oriented. No focal neurological deficits. Extremities: Symmetric 5 x 5 power. Skin: No rashes, lesions or ulcers Psychiatry: Judgement and insight appear normal. Mood & affect appropriate.    The results of significant  diagnostics from this hospitalization (including imaging, microbiology, ancillary and laboratory) are listed below for  reference.     Microbiology: Recent Results (from the past 240 hour(s))  Urine Culture     Status: Abnormal   Collection Time: 04/12/20 10:25 PM   Specimen: Urine, Clean Catch  Result Value Ref Range Status   Specimen Description   Final    URINE, CLEAN CATCH Performed at [facility], [address] [place], [place] [address]    Special Requests   Final    NONE Performed at [facility], [address] [place], [place] [address]    Culture (A)  Final    >=100,000 COLONIES/mL ACINETOBACTER SPECIES >=100,000 COLONIES/mL ENTEROCOCCUS FAECALIS    Report Status 04/15/2020 FINAL  Final   Organism ID, Bacteria ACINETOBACTER SPECIES (A)  Final   Organism ID, Bacteria ENTEROCOCCUS FAECALIS (A)  Final      Susceptibility   Acinetobacter species - MIC*    CEFTAZIDIME 4 SENSITIVE Sensitive     CIPROFLOXACIN <=0.25 SENSITIVE Sensitive     GENTAMICIN <=1 SENSITIVE Sensitive     IMIPENEM <=0.25 SENSITIVE Sensitive     PIP/TAZO <=4 SENSITIVE Sensitive     TRIMETH/SULFA <=20 SENSITIVE Sensitive     AMPICILLIN/SULBACTAM <=2 SENSITIVE Sensitive     * >=100,000 COLONIES/mL ACINETOBACTER SPECIES   Enterococcus faecalis - MIC*    AMPICILLIN <=2 SENSITIVE Sensitive     NITROFURANTOIN <=16 SENSITIVE Sensitive     VANCOMYCIN 2 SENSITIVE Sensitive     * >=100,000 COLONIES/mL ENTEROCOCCUS FAECALIS  SARS Coronavirus 2 by RT PCR (hospital order, performed in [facility] hospital lab) Nasopharyngeal Nasopharyngeal Swab     Status: None   Collection Time: 04/12/20 10:48 PM   Specimen: Nasopharyngeal Swab  Result Value Ref Range Status   SARS Coronavirus 2 NEGATIVE NEGATIVE Final    Comment: (NOTE) SARS-CoV-2 target nucleic acids are NOT DETECTED.  The SARS-CoV-2 RNA is generally detectable in upper and lower respiratory specimens during the acute phase of infection. The lowest concentration of SARS-CoV-2 viral copies this assay can detect is 250 copies / mL. A negative result does not preclude  SARS-CoV-2 infection and should not be used as the sole basis for treatment or other patient management decisions.  A negative result may occur with improper specimen collection / handling, submission of specimen other than nasopharyngeal swab, presence of viral mutation(s) within the areas targeted by this assay, and inadequate number of viral copies (<250 copies / mL). A negative result must be combined with clinical observations, patient history, and epidemiological information.  Fact Sheet for Patients:   StrictlyIdeas.no  Fact Sheet for Healthcare Providers: BankingDealers.co.za  This test is not yet approved or  cleared by the [place] FDA and has been authorized for detection and/or diagnosis of SARS-CoV-2 by FDA under an Emergency Use Authorization (EUA).  This EUA will remain in effect (meaning this test can be used) for the duration of the COVID-19 declaration under Section 564(b)(1) of the Act, 21 U.S.C. section 360bbb-3(b)(1), unless the authorization is terminated or revoked sooner.  Performed at [facility] [place], [address], [place], [place] [address]      Labs: BNP (last 3 results) Recent Labs    04/12/20 2050 04/14/20 0610 04/15/20 0504  BNP 4,395.0* 3,295.0* 1,572.6*   Basic Metabolic Panel: Recent Labs  Lab 04/12/20 2050 04/13/20 0045 04/13/20 0544 04/13/20 1221 04/14/20 0610 04/15/20 0504 04/16/20 0416  NA 120*   < > 124* 124* 122* 123* 123*  K 4.5   < > 4.0 3.8 3.9 4.0 5.0  CL 88*   < > 89* 89* 91* 91* 91*  CO2 18*   < > 22 22 21* 22 22  GLUCOSE 99   < > 111* 110* 88 96 95  BUN 18   < > '19 19 23 ' 26* 33*  CREATININE 0.96   < > 0.93 0.99 0.97 1.06 1.15  CALCIUM 8.2*   < > 8.2* 8.1* 7.7* 7.5* 7.9*  MG 1.8  --  1.7  --  1.7 1.7 1.8   < > = values in this interval not displayed.   Liver Function Tests: Recent Labs  Lab 04/12/20 2050 04/13/20 0544 04/15/20 0504 04/16/20 0416  AST 76* 65* 39  32  ALT 216* 186* 116* 94*  ALKPHOS 370* 346* 329* 353*  BILITOT 1.0 0.9 0.7 0.9  PROT 6.5 6.0* 5.6* 6.0*  ALBUMIN 3.3* 3.1* 2.9* 3.0*   No results for input(s): LIPASE, AMYLASE in the last 168 hours. No results for input(s): AMMONIA in the last 168 hours. CBC: Recent Labs  Lab 04/12/20 2050 04/13/20 0544 04/15/20 0504  WBC 4.3 4.1 5.7  NEUTROABS 3.0 2.6  --   HGB 9.4* 9.4* 8.6*  HCT 29.2* 28.5* 27.1*  MCV 78.3* 77.4* 79.9*  PLT 39* 46* 77*   Cardiac Enzymes: No results for input(s): CKTOTAL, CKMB, CKMBINDEX, TROPONINI in the last 168 hours. BNP: Invalid input(s): POCBNP CBG: Recent Labs  Lab 04/13/20 1140  GLUCAP 127*   D-Dimer No results for input(s): DDIMER in the last 72 hours. Hgb A1c No results for input(s): HGBA1C in the last 72 hours. Lipid Profile No results for input(s): CHOL, HDL, LDLCALC, TRIG, CHOLHDL, LDLDIRECT in the last 72 hours. Thyroid function studies No results for input(s): TSH, T4TOTAL, T3FREE, THYROIDAB in the last 72 hours.  Invalid input(s): FREET3 Anemia work up No results for input(s): VITAMINB12, FOLATE, FERRITIN, TIBC, IRON, RETICCTPCT in the last 72 hours. Urinalysis    Component Value Date/Time   COLORURINE YELLOW 04/12/2020 2225   APPEARANCEUR CLOUDY (A) 04/12/2020 2225   LABSPEC 1.013 04/12/2020 2225   PHURINE 5.0 04/12/2020 2225   GLUCOSEU NEGATIVE 04/12/2020 2225   GLUCOSEU 100 (A) 07/08/2010 1902   HGBUR LARGE (A) 04/12/2020 2225   HGBUR small 07/10/2010 1004   BILIRUBINUR NEGATIVE 04/12/2020 2225   BILIRUBINUR neg 02/21/2020 0920   KETONESUR NEGATIVE 04/12/2020 2225   PROTEINUR >=300 (A) 04/12/2020 2225   UROBILINOGEN negative (A) 02/21/2020 0920   UROBILINOGEN 0.2 02/05/2011 1016   NITRITE NEGATIVE 04/12/2020 2225   LEUKOCYTESUR LARGE (A) 04/12/2020 2225   Sepsis Labs Invalid input(s): PROCALCITONIN,  WBC,  LACTICIDVEN Microbiology Recent Results (from the past 240 hour(s))  Urine Culture     Status: Abnormal    Collection Time: 04/12/20 10:25 PM   Specimen: Urine, Clean Catch  Result Value Ref Range Status   Specimen Description   Final    URINE, CLEAN CATCH Performed at [facility], [address], [place], [place] [address]    Special Requests   Final    NONE Performed at [facility], [address] [place], [place] [address]    Culture (A)  Final    >=100,000 COLONIES/mL ACINETOBACTER SPECIES >=100,000 COLONIES/mL ENTEROCOCCUS FAECALIS    Report Status 04/15/2020 FINAL  Final   Organism ID, Bacteria ACINETOBACTER SPECIES (A)  Final   Organism ID, Bacteria ENTEROCOCCUS FAECALIS (A)  Final      Susceptibility   Acinetobacter species -  MIC*    CEFTAZIDIME 4 SENSITIVE Sensitive     CIPROFLOXACIN <=0.25 SENSITIVE Sensitive     GENTAMICIN <=1 SENSITIVE Sensitive     IMIPENEM <=0.25 SENSITIVE Sensitive     PIP/TAZO <=4 SENSITIVE Sensitive     TRIMETH/SULFA <=20 SENSITIVE Sensitive     AMPICILLIN/SULBACTAM <=2 SENSITIVE Sensitive     * >=100,000 COLONIES/mL ACINETOBACTER SPECIES   Enterococcus faecalis - MIC*    AMPICILLIN <=2 SENSITIVE Sensitive     NITROFURANTOIN <=16 SENSITIVE Sensitive     VANCOMYCIN 2 SENSITIVE Sensitive     * >=100,000 COLONIES/mL ENTEROCOCCUS FAECALIS  SARS Coronavirus 2 by RT PCR (hospital order, performed in [place] hospital lab) Nasopharyngeal Nasopharyngeal Swab     Status: None   Collection Time: 04/12/20 10:48 PM   Specimen: Nasopharyngeal Swab  Result Value Ref Range Status   SARS Coronavirus 2 NEGATIVE NEGATIVE Final    Comment: (NOTE) SARS-CoV-2 target nucleic acids are NOT DETECTED.  The SARS-CoV-2 RNA is generally detectable in upper and lower respiratory specimens during the acute phase of infection. The lowest concentration of SARS-CoV-2 viral copies this assay can detect is 250 copies / mL. A negative result does not preclude SARS-CoV-2 infection and should not be used as the sole basis for treatment or other patient management  decisions.  A negative result may occur with improper specimen collection / handling, submission of specimen other than nasopharyngeal swab, presence of viral mutation(s) within the areas targeted by this assay, and inadequate number of viral copies (<250 copies / mL). A negative result must be combined with clinical observations, patient history, and epidemiological information.  Fact Sheet for Patients:   StrictlyIdeas.no  Fact Sheet for Healthcare Providers: BankingDealers.co.za  This test is not yet approved or  cleared by the [place] FDA and has been authorized for detection and/or diagnosis of SARS-CoV-2 by FDA under an Emergency Use Authorization (EUA).  This EUA will remain in effect (meaning this test can be used) for the duration of the COVID-19 declaration under Section 564(b)(1) of the Act, 21 U.S.C. section 360bbb-3(b)(1), unless the authorization is terminated or revoked sooner.  Performed at [facility], [address] [place], [place] [address]    Time coordinating discharge: 35 minutes   SIGNED:  Irwin Brakeman, MD  [facility] 04/16/2020, 7:59 AM How to contact the [facility] Attending or Consulting provider [place] or covering provider during after hours [place], for this patient?  1. Check the care team in [facility] and look for a) attending/consulting TRH provider listed and b) the [facility] team listed 2. Log into [email or web address] and use [place]'s universal password to access. If you do not have the password, please contact the hospital operator. 3. Locate the [facility] provider you are looking for under [facility] and page to a number that you can be directly reached. 4. If you still have difficulty reaching the provider, please page the [facility] (Director on Call) for the Hospitalists listed on amion for assistance.

## 2020-04-15 NOTE — TOC Initial Note (Addendum)
Transition of Care [facility]) - Initial/Assessment Note    Patient Details  Name: [name] MRN: [id number] Date of Birth: November 23, 1950  Transition of Care [facility]) CM/SW Contact:    Ihor Gully, LCSW Phone Number: 04/15/2020, 12:03 PM  Clinical Narrative:                 Patient from home alone. Independent at baseline. Sister and brother in law are supports. At baseline, ambulates with a cane. Has a walker but says his house is too small for a walker to maneuver.  Patient's 3n1 delivered to room. HH ordered (RN/PT/aide). Patient referred to multiple agencies for [facility]. Cory with [facility] accepts referral for [facility] (RN/PT/aide). Patient advised and agreeable to [facility] services.  Expected Discharge Plan: [place] Barriers to Discharge: No Barriers Identified   Patient Goals and CMS Choice        Expected Discharge Plan and Services Expected Discharge Plan: [place]         Expected Discharge Date: 04/15/20               DME Arranged: 3-N-1 DME Agency: [employer] Date DME Agency Contacted: 04/15/20 Time DME Agency Contacted: [phone] Representative spoke with at DME Agency: Barbaraann Rondo HH Arranged: RN, PT, Nurse's Aide [place] Agency: [place] Date [place]: 04/15/20 Time [place]: 1203 Representative spoke with at [place] Arrangements/Services                       Activities of Daily Living Home Assistive Devices/Equipment: Radio producer (specify quad or straight), Walker (specify type) ADL Screening (condition at time of admission) Patient's cognitive ability adequate to safely complete daily activities?: Yes Is the patient deaf or have difficulty hearing?: No Does the patient have difficulty seeing, even when wearing glasses/contacts?: No Does the patient have difficulty concentrating, remembering, or making decisions?: No Patient able to express need for assistance with ADLs?: Yes Does the  patient have difficulty dressing or bathing?: No Independently performs ADLs?: Yes (appropriate for developmental age) Does the patient have difficulty walking or climbing stairs?: Yes Weakness of Legs: Both Weakness of Arms/Hands: None  Permission Sought/Granted                  Emotional Assessment              Admission diagnosis:  Hyponatremia [E87.1] Transaminitis [R74.01] CHF exacerbation ([place]) [I50.9] Acute on chronic congestive heart failure, unspecified heart failure type [facility]) [I50.9] Patient Active Problem List   Diagnosis Date Noted  . Hyponatremia   . Transaminitis   . CHF exacerbation ([place]) 04/12/2020  . Hydronephrosis 04/01/2020  . Malignant neoplasm of overlapping sites of bladder ([place]) 03/29/2020  . Iron deficiency anemia 03/27/2020  . Nausea with vomiting 03/27/2020  . Encounter for support and coordination of transition of care 03/19/2020  . UTI (urinary tract infection) 03/06/2020  . Port-A-Cath in place 02/23/2020  . Hydrocele 02/21/2020  . Metastatic urothelial carcinoma ([place]) 02/13/2020  . Goals of care, counseling/discussion 02/13/2020  . Chronic anemia 01/31/2020  . Acute on chronic HFrEF (heart failure with reduced ejection fraction) -combined systolic and diastolic dysfunction CHF 10/93/2355  . Nervously anxious 12/20/2019  . Depression, major, single episode, mild ([place]) 12/20/2019  . Weight loss, non-intentional 10/17/2019  . Annual visit for general adult medical examination with abnormal findings 09/12/2019  . Supraclavicular lymphadenopathy 09/12/2019  . Grade 3 out  of 6 intensity murmur 09/12/2019  . Bilateral recurrent inguinal hernias 03/08/2019  . Claudication in peripheral vascular disease ([place]) 10/31/2018  . Peripheral arterial disease ([place]) 10/26/2018  . Shortness of breath on exertion 10/16/2017  . Severe aortic insufficiency 10/02/2017  . Allergic rhinitis 12/30/2015  . Hyperlipidemia LDL goal <70 08/25/2013  . Aortic  atherosclerosis ([place]) 08/12/2013  . Coronary atherosclerosis of native coronary artery 08/10/2013  . Hepatic artery aneurysm ([name]) 12/10/2010  . S/P aortic valve replacement   . Essential hypertension, benign 05/13/2010   PCP:  [name], MD Pharmacy:   [facility] [address], [place] [address] [place] [address] [place] [address] Phone: [phone] Fax: [phone]     Social Determinants of Health (SDOH) Interventions    Readmission Risk Interventions Readmission Risk Prevention Plan 03/07/2020  Transportation Screening Complete  PCP or Specialist Appt within 3-5 Days Not Complete  Not Complete comments Assessment completed during second shift  [place] or [place] Complete  Social Work Consult for [place] Planning/Counseling Complete  Palliative Care Screening Not Applicable  Medication Review Press photographer) Complete  Some recent data might be hidden

## 2020-04-15 NOTE — Discharge Instructions (Signed)
Heart Failure, Self Care °Heart failure is a serious condition. This sheet explains things you need to do to take care of yourself at home. To help you stay as healthy as possible, you may be asked to change your diet, take certain medicines, and make other changes in your life. Your doctor may also give you more specific instructions. If you have problems or questions, call your doctor. °What are the risks? °Having heart failure makes it more likely for you to have some problems. These problems can get worse if you do not take good care of yourself. Problems may include: °· Blood clotting problems. This may cause a stroke. °· Damage to the kidneys, liver, or lungs. °· Abnormal heart rhythms. °Supplies needed: °· Scale for weighing yourself. °· Blood pressure monitor. °· Notebook. °· Medicines. °How to care for yourself when you have heart failure °Medicines °Take over-the-counter and prescription medicines only as told by your doctor. Take your medicines every day. °· Do not stop taking your medicine unless your doctor tells you to do so. °· Do not skip any medicines. °· Get your prescriptions refilled before you run out of medicine. This is important. °Eating and drinking ° °· Eat heart-healthy foods. Talk with a diet specialist (dietitian) to create an eating plan. °· Choose foods that: °? Have no trans fat. °? Are low in saturated fat and cholesterol. °· Choose healthy foods, such as: °? Fresh or frozen fruits and vegetables. °? Fish. °? Low-fat (lean) meats. °? Legumes, such as beans, peas, and lentils. °? Fat-free or low-fat dairy products. °? Whole-grain foods. °? High-fiber foods. °· Limit salt (sodium) if told by your doctor. Ask your diet specialist to tell you which seasonings are healthy for your heart. °· Cook in healthy ways instead of frying. Healthy ways of cooking include roasting, grilling, broiling, baking, poaching, steaming, and stir-frying. °· Limit how much fluid you drink, if told by your  doctor. °Alcohol use °· Do not drink alcohol if: °? Your doctor tells you not to drink. °? Your heart was damaged by alcohol, or you have very bad heart failure. °? You are pregnant, may be pregnant, or are planning to become pregnant. °· If you drink alcohol: °? Limit how much you use to: °§ 0-1 drink a day for women. °§ 0-2 drinks a day for men. °? Be aware of how much alcohol is in your drink. In the [place]., one drink equals one 12 oz bottle of beer (355 mL), one 5 oz glass of wine (148 mL), or one 1½ oz glass of hard liquor (44 mL). °Lifestyle ° °· Do not use any products that contain nicotine or tobacco, such as cigarettes, e-cigarettes, and chewing tobacco. If you need help quitting, ask your doctor. °? Do not use nicotine gum or patches before talking to your doctor. °· Do not use illegal drugs. °· Lose weight if told by your doctor. °· Do physical activity if told by your doctor. Talk to your doctor before you begin an exercise if: °? You are an older adult. °? You have very bad heart failure. °· Learn to manage stress. If you need help, ask your doctor. °· Get rehab (rehabilitation) to help you stay independent and to help with your quality of life. °· Plan time to rest when you get tired. °Check weight and blood pressure ° °· Weigh yourself every day. This will help you to know if fluid is building up in your body. °? Weigh yourself every morning   after you pee (urinate) and before you eat breakfast. ? Wear the same amount of clothing each time. ? Write down your daily weight. Give your record to your doctor.  Check and write down your blood pressure as told by your doctor.  Check your pulse as told by your doctor. Dealing with very hot and very cold weather  If it is very hot: ? Avoid activities that take a lot of energy. ? Use air conditioning or fans, or find a cooler place. ? Avoid caffeine and alcohol. ? Wear clothing that is loose-fitting, lightweight, and light-colored.  If it is very  cold: ? Avoid activities that take a lot of energy. ? Layer your clothes. ? Wear mittens or gloves, a hat, and a scarf when you go outside. ? Avoid alcohol. Follow these instructions at home:  Stay up to date with shots (vaccines). Get pneumococcal and flu (influenza) shots.  Keep all follow-up visits as told by your doctor. This is important. Contact a doctor if:  You gain weight quickly.  You have increasing shortness of breath.  You cannot do your normal activities.  You get tired easily.  You cough a lot.  You don't feel like eating or feel like you may vomit (nauseous).  You become puffy (swell) in your hands, feet, ankles, or belly (abdomen).  You cannot sleep well because it is hard to breathe.  You feel like your heart is beating fast (palpitations).  You get dizzy when you stand up. Get help right away if:  You have trouble breathing.  You or someone else notices a change in your behavior, such as having trouble staying awake.  You have chest pain or discomfort.  You pass out (faint). These symptoms may be an emergency. Do not wait to see if the symptoms will go away. Get medical help right away. Call your local emergency services (911 in the [place].S.). Do not drive yourself to the hospital. Summary  Heart failure is a serious condition. To care for yourself, you may have to change your diet, take medicines, and make other lifestyle changes.  Take your medicines every day. Do not stop taking them unless your doctor tells you to do so.  Eat heart-healthy foods, such as fresh or frozen fruits and vegetables, fish, lean meats, legumes, fat-free or low-fat dairy products, and whole-grain or high-fiber foods.  Ask your doctor if you can drink alcohol. You may have to stop alcohol use if you have very bad heart failure.  Contact your doctor if you gain weight quickly or feel that your heart is beating too fast. Get help right away if you pass out, or have chest pain  or trouble breathing. This information is not intended to replace advice given to you by your health care provider. Make sure you discuss any questions you have with your health care provider. Document Revised: 12/20/2018 Document Reviewed: 12/21/2018 Elsevier Patient Education  [place].    IMPORTANT INFORMATION: PAY CLOSE ATTENTION   PHYSICIAN DISCHARGE INSTRUCTIONS  Follow with Primary care provider  [name], MD  and other consultants as instructed by your Hospitalist Physician  [place] IF SYMPTOMS COME BACK, WORSEN OR NEW PROBLEM DEVELOPS   Please note: You were cared for by a hospitalist during your hospital stay. Every effort will be made to forward records to your primary care provider.  You can request that your primary care provider send for your hospital records if they  have not received them.  Once you are discharged, your primary care physician will handle any further medical issues. Please note that NO REFILLS for any discharge medications will be authorized once you are discharged, as it is imperative that you return to your primary care physician (or establish a relationship with a primary care physician if you do not have one) for your post hospital discharge needs so that they can reassess your need for medications and monitor your lab values.  Please get a complete blood count and chemistry panel checked by your Primary MD at your next visit, and again as instructed by your Primary MD.  Get Medicines reviewed and adjusted: Please take all your medications with you for your next visit with your Primary MD  Laboratory/radiological data: Please request your Primary MD to go over all hospital tests and procedure/radiological results at the follow up, please ask your primary care provider to get all Hospital records sent to his/her office.  In some cases, they will be blood work, cultures and biopsy results pending at  the time of your discharge. Please request that your primary care provider follow up on these results.  If you are diabetic, please bring your blood sugar readings with you to your follow up appointment with primary care.    Please call and make your follow up appointments as soon as possible.    Also Note the following: If you experience worsening of your admission symptoms, develop shortness of breath, life threatening emergency, suicidal or homicidal thoughts you must seek medical attention immediately by calling 911 or calling your MD immediately  if symptoms less severe.  You must read complete instructions/literature along with all the possible adverse reactions/side effects for all the Medicines you take and that have been prescribed to you. Take any new Medicines after you have completely understood and accpet all the possible adverse reactions/side effects.   Do not drive when taking Pain medications or sleeping medications (Benzodiazepines)  Do not take more than prescribed Pain, Sleep and Anxiety Medications. It is not advisable to combine anxiety,sleep and pain medications without talking with your primary care practitioner  Special Instructions: If you have smoked or chewed Tobacco  in the last 2 yrs please stop smoking, stop any regular Alcohol  and or any Recreational drug use.  Wear Seat belts while driving.  Do not drive if taking any narcotic, mind altering or controlled substances or recreational drugs or alcohol.

## 2020-04-15 NOTE — Care Management (Signed)
Bedside RN called stating patient has worsening pain in bilateral lower extremities.  Per RN this pain is consistent with prior.  Patient requesting something stronger than Tylenol.  Oxycodone 5 mg every 4 hours as needed x3 doses prescribed.  Ralene Muskrat MD

## 2020-04-15 NOTE — Care Management Important Message (Signed)
Important Message  Patient Details  Name: [name] MRN: [id number] Date of Birth: 11/22/1950   Medicare Important Message Given:  Yes     [name] 04/15/2020, 1:26 PM

## 2020-04-15 NOTE — Evaluation (Signed)
Physical Therapy Evaluation Patient Details Name: [name] MRN: [id number] DOB: 1951-09-17 Today's Date: 04/15/2020   History of Present Illness  [name]  is a 69 y.o. male, with history of recently diagnosed renal cancer, peripheral vascular disease, history of DVT, hypertension, CAD, depression, and more presents to the ED with a chief complaint of panic attacks. Patient reports that he has had increased anxiety attacks over the last week. Patient reports that he has had 2 anxiety attacks today, but he has had up to 4 anxiety attacks in 1 day. He reports that they happen during all times of the day. He wakes up in the middle of the night gasping for air with chest pains and palpitations. I believe the these middle of the night events are orthopnea. Patient reports that he has peripheral edema. He has dyspnea. He describes that his dyspnea is worse with exertion. He reports that he can only walk 15 feet before he feels like he is going to pass out. He has not passed out. Patient reports that he has intermittent chest pain. He thought it was heartburn or indigestion at first, but it did not go away. Anxiety makes the chest pain worse. She has associated palpitations. Patient reports that he has a cough productive of clear sputum. He has had no fevers. He has no sick contacts. He has had Covid vaccine. His last chemo was 2 weeks ago. Allergy told him his next treatment will be in 6 weeks.    Clinical Impression  Patient functioning near baseline for functional mobility and gait, other than requiring increased time to complete task and limited for ambulation due to c/o leg soreness.  Patient demonstrates good return for transfers and walking in room.  Plan:  Patient discharged from physical therapy to care of nursing for ambulation daily as tolerated for length of stay.     Follow Up Recommendations Home health PT    Equipment Recommendations  3in1 (PT) (shower chair)     Recommendations for Other Services       Precautions / Restrictions Precautions Precautions: Fall Restrictions Weight Bearing Restrictions: No      Mobility  Bed Mobility Overal bed mobility: Modified Independent             General bed mobility comments: increased time  Transfers Overall transfer level: Modified independent               General transfer comment: increased time, slightly labored movement  Ambulation/Gait Ambulation/Gait assistance: Supervision Gait Distance (Feet): 35 Feet Assistive device: Straight cane Gait Pattern/deviations: Decreased step length - right;Decreased step length - left;Step-to pattern;Decreased stride length Gait velocity: decreased   General Gait Details: slow labored cadence with occasional standing rest breaks, limited secondary to fatigue and BLE pain/discomfort  Stairs            Wheelchair Mobility    Modified Rankin (Stroke Patients Only)       Balance Overall balance assessment: Needs assistance Sitting-balance support: Feet supported;No upper extremity supported Sitting balance-Leahy Scale: Good Sitting balance - Comments: seated at EOB   Standing balance support: During functional activity;Single extremity supported Standing balance-Leahy Scale: Fair Standing balance comment: fair/good using SPC                             Pertinent Vitals/Pain Pain Assessment: 0-10 Pain Score: 9  Pain Location: BLE Pain Descriptors / Indicators: Sore;Guarding;Grimacing Pain Intervention(s): Limited activity within patient's tolerance;Monitored  during session    [place] expects to be discharged to:: Private residence Living Arrangements: Alone Available Help at Discharge: Family;Friend(s);Available PRN/intermittently Type of Home: House Home Access: Stairs to enter Entrance Stairs-Rails: Right Entrance Stairs-Number of Steps: 4 Home Layout: One level Home Equipment: Cane -  single point;Walker - 2 wheels      Prior Function Level of Independence: Independent with assistive device(s)         Comments: household and short distanced community ambulator using [facility]     Hand Dominance        Extremity/Trunk Assessment   Upper Extremity Assessment Upper Extremity Assessment: Overall WFL for tasks assessed    Lower Extremity Assessment Lower Extremity Assessment: Generalized weakness    Cervical / Trunk Assessment Cervical / Trunk Assessment: Normal  Communication   Communication: No difficulties  Cognition Arousal/Alertness: Awake/alert Behavior During Therapy: WFL for tasks assessed/performed Overall Cognitive Status: Within Functional Limits for tasks assessed                                        General Comments      Exercises     Assessment/Plan    PT Assessment All further PT needs can be met in the next venue of care  PT Problem List Decreased strength;Decreased activity tolerance;Decreased balance;Decreased mobility       PT Treatment Interventions      PT Goals (Current goals can be found in the Care Plan section)  Acute Rehab PT Goals Patient Stated Goal: return home PT Goal Formulation: With patient Time For Goal Achievement: 04/15/20 Potential to Achieve Goals: Good    Frequency     Barriers to discharge        Co-evaluation               AM-PAC PT "6 Clicks" Mobility  Outcome Measure Help needed turning from your back to your side while in a flat bed without using bedrails?: None Help needed moving from lying on your back to sitting on the side of a flat bed without using bedrails?: None Help needed moving to and from a bed to a chair (including a wheelchair)?: None Help needed standing up from a chair using your arms (e.g., wheelchair or bedside chair)?: None Help needed to walk in hospital room?: A Little Help needed climbing 3-5 steps with a railing? : A Little 6 Click Score: 22     End of Session   Activity Tolerance: Patient tolerated treatment well;Patient limited by fatigue Patient left: in chair;with call bell/phone within reach Nurse Communication: Mobility status PT Visit Diagnosis: Unsteadiness on feet (R26.81);Other abnormalities of gait and mobility (R26.89);Muscle weakness (generalized) (M62.81)    Time: 0109-3235 PT Time Calculation (min) (ACUTE ONLY): 23 min   Charges:   PT Evaluation $PT Eval Moderate Complexity: 1 Mod PT Treatments $Therapeutic Activity: 23-37 mins        10:46 AM, 04/15/20 Lonell Grandchild, MPT Physical Therapist with [facility] [phone] office [phone] mobile phone

## 2020-04-16 ENCOUNTER — Ambulatory Visit (HOSPITAL_COMMUNITY): Admission: RE | Admit: 2020-04-16 | Payer: Medicare Other | Source: Home / Self Care | Admitting: Gastroenterology

## 2020-04-16 ENCOUNTER — Encounter (HOSPITAL_COMMUNITY)
Admission: EM | Disposition: A | Discharge status: Home Health | Payer: Self-pay | Source: Home / Self Care | Attending: Family Medicine

## 2020-04-16 ENCOUNTER — Telehealth: Payer: Self-pay

## 2020-04-16 LAB — BASIC METABOLIC PANEL
Anion gap: 10 (ref 5–15)
BUN: 33 mg/dL — ABNORMAL HIGH (ref 8–23)
CO2: 22 mmol/L (ref 22–32)
Calcium: 7.9 mg/dL — ABNORMAL LOW (ref 8.9–10.3)
Chloride: 91 mmol/L — ABNORMAL LOW (ref 98–111)
Creatinine, Ser: 1.15 mg/dL (ref 0.61–1.24)
GFR calc Af Amer: >60 mL/min (ref >60)
GFR calc non Af Amer: >60 mL/min (ref >60)
Glucose, Bld: 95 mg/dL (ref 70–99)
Potassium: 5 mmol/L (ref 3.5–5.1)
Sodium: 123 mmol/L — ABNORMAL LOW (ref 135–145)

## 2020-04-16 LAB — MAGNESIUM: Magnesium: 1.8 mg/dL (ref 1.7–2.4)

## 2020-04-16 LAB — HEPATIC FUNCTION PANEL
ALT: 94 U/L — ABNORMAL HIGH (ref 0–44)
AST: 32 U/L (ref 15–41)
Albumin: 3 g/dL — ABNORMAL LOW (ref 3.5–5.0)
Alkaline Phosphatase: 353 U/L — ABNORMAL HIGH (ref 38–126)
Bilirubin, Direct: 0.2 mg/dL (ref 0.0–0.2)
Indirect Bilirubin: 0.7 mg/dL (ref 0.3–0.9)
Total Bilirubin: 0.9 mg/dL (ref 0.3–1.2)
Total Protein: 6 g/dL — ABNORMAL LOW (ref 6.5–8.1)

## 2020-04-16 SURGERY — IMAGING PROCEDURE, GI TRACT, INTRALUMINAL, VIA CAPSULE

## 2020-04-16 MED ORDER — POTASSIUM CHLORIDE CRYS ER 10 MEQ PO TBCR: 10.0000 meq | EXTENDED_RELEASE_TABLET | ORAL | 0 refills | Status: DC

## 2020-04-16 MED ORDER — FUROSEMIDE 40 MG PO TABS
40.0000 mg | ORAL_TABLET | Freq: Two times a day (BID) | ORAL | Status: DC
  Administered 2020-04-16 – 2020-04-17 (×2): 40 mg via ORAL
  Filled 2020-04-16 (×2): qty 1

## 2020-04-16 NOTE — Progress Notes (Signed)
04/16/2020 7:38 PM  Pt was discharged 7/26.  He remains stable to discharge.  Labs and vitals are stable.  He ambulated 100 feet.  He is appealing discharge due to issues he reported to me about girlfriend working and not able to come pick up. I asked case management/social work to assist him with his discharge needs.  He requested home health which has been ordered.  Please see dictated DC summary.    Murvin Natal MD  How to contact the [facility] Attending or Consulting provider [place] or covering provider during after hours [place], for this patient?  1. Check the care team in [facility] and look for a) attending/consulting [facility] provider listed and b) the [facility] team listed 2. Log into [email or web address] and use [place]'s universal password to access. If you do not have the password, please contact the hospital operator. 3. Locate the [facility] provider you are looking for under [facility] and page to a number that you can be directly reached. 4. If you still have difficulty reaching the provider, please page the [facility] (Director on Call) for the Hospitalists listed on amion for assistance.

## 2020-04-16 NOTE — Progress Notes (Signed)
Patient ambulated in hallway with walker approximately 100 feet. Tolerated walk well.

## 2020-04-16 NOTE — Telephone Encounter (Signed)
Received note that patient was discharged and pt has a discharge summary in his chart. Patient stated he had appealed the discharge and he is still currently hospitalized. Asked patient to call us when he was discharged so we could ask a few questions and scheduled a follow up appt with him.

## 2020-04-17 DIAGNOSIS — I5023 Acute on chronic systolic (congestive) heart failure: Secondary | ICD-10-CM | POA: Diagnosis not present

## 2020-04-17 MED ORDER — AMOXICILLIN-POT CLAVULANATE 875-125 MG PO TABS: 1.0000 | ORAL_TABLET | Freq: Two times a day (BID) | ORAL | 0 refills | Status: DC

## 2020-04-17 NOTE — Progress Notes (Signed)
Patient seen and evaluated this a.m. with no significant complaints or concerns noted.  He is requiring transport which is being arranged by CSW.  No other concerns noted overnight.  Stable for discharge.  Please see discharge summary dictated 7/26 for full details.  Total care time: 15 minutes.

## 2020-04-17 NOTE — Progress Notes (Signed)
IV removed, 2x2 gauze and paper tape applied to site, patient tolerated well.  Reviewed AVS with patient who verbalized understanding.  Taken to front lobby via wheelchair and transported home by transportation set up by CSW.

## 2020-04-18 ENCOUNTER — Other Ambulatory Visit (HOSPITAL_COMMUNITY): Payer: Medicare Other

## 2020-04-18 ENCOUNTER — Ambulatory Visit (HOSPITAL_COMMUNITY): Payer: Medicare Other

## 2020-04-18 ENCOUNTER — Ambulatory Visit (HOSPITAL_COMMUNITY): Payer: Medicare Other | Admitting: Hematology

## 2020-04-18 ENCOUNTER — Other Ambulatory Visit: Payer: Self-pay | Admitting: *Deleted

## 2020-04-19 ENCOUNTER — Other Ambulatory Visit: Payer: Self-pay | Admitting: *Deleted

## 2020-04-19 NOTE — Patient Outreach (Signed)
[facility]) Care Management  04/19/2020  [name] [birth year] [id number]   Referral Date: 7/29 Referral Source: Hospital liaison Referral Reason: Post discharge Insurance: [facility]   Outreach attempt #1, successful, identity verified.  This care manager introduced self and stated purpose of call.  [facility] care management services explained.  Social: Member report he lives alone but has sister, brother in Sports coach, and nephew that all live within yards of him.  State he is currently unable to bathe, dress, or cook for himself at that this time.  He is unsure what his plan will be, hoping to have more answers once the home health nurse arrive today for assessment.  State he has been eating sandwiches since his discharge.  Agrees to have CSW follow up for food resources.  Conditions: Per chart, has history of HTN, CAD, CHF, PAD, Metastatic urothelial carcinoma, AVR, and depression.  Assessment cut short due to home health nurse calling/arriving for evaluation.   Plan: RN CM will follow place referral to CSW and follow up with member within the next week.  Goals Addressed              This Visit's Progress   .  Stay out of the hospital (pt-stated)        [name] (see longtitudinal plan of care for additional care plan information)   Current Barriers:  [name] Knowledge deficit related to basic heart failure pathophysiology and self care management . Knowledge Deficits related to heart failure medications  Case Manager Clinical Goal(s):  [name] Over the next 31 days, patient will verbalize understanding of Heart Failure Action Plan and when to call doctor . Over the next 28 days, patient will weigh daily and record (notifying MD of 3 lb weight gain over night or 5 lb in a week) . Patient will verbalize two symptoms of CHF exacerbation within the next 28 days.    Interventions:  . Basic overview and discussion of pathophysiology of Heart Failure reviewed  . Assessed  need for readable accurate scales in home . Provided education about placing scale on hard, flat surface . Advised patient to weigh each morning after emptying bladder  Patient Self Care Activities:  . Takes Heart Failure Medications as prescribed . Weighs daily and record (notifying MD of 3 lb weight gain over night or 5 lb in a week) . Verbalizes understanding of and follows CHF Action Plan  Initial goal documentation       [name], RN, MSN [place] [phone]

## 2020-04-21 ENCOUNTER — Encounter (HOSPITAL_COMMUNITY): Payer: Self-pay | Admitting: Radiology

## 2020-04-21 ENCOUNTER — Inpatient Hospital Stay (HOSPITAL_COMMUNITY)
Admission: EM | Admit: 2020-04-21 | Discharge: 2020-04-25 | DRG: 175 | Disposition: A | Discharge status: Skilled Nursing Facility | Payer: Medicare Other | Attending: Family Medicine | Admitting: Family Medicine

## 2020-04-21 ENCOUNTER — Emergency Department (HOSPITAL_COMMUNITY): Payer: Medicare Other

## 2020-04-21 ENCOUNTER — Other Ambulatory Visit: Payer: Self-pay

## 2020-04-21 DIAGNOSIS — I5043 Acute on chronic combined systolic (congestive) and diastolic (congestive) heart failure: Secondary | ICD-10-CM | POA: Diagnosis present

## 2020-04-21 DIAGNOSIS — R531 Weakness: Secondary | ICD-10-CM | POA: Diagnosis not present

## 2020-04-21 DIAGNOSIS — Z79899 Other long term (current) drug therapy: Secondary | ICD-10-CM

## 2020-04-21 DIAGNOSIS — J9601 Acute respiratory failure with hypoxia: Secondary | ICD-10-CM | POA: Diagnosis not present

## 2020-04-21 DIAGNOSIS — Z7982 Long term (current) use of aspirin: Secondary | ICD-10-CM

## 2020-04-21 DIAGNOSIS — Z86718 Personal history of other venous thrombosis and embolism: Secondary | ICD-10-CM | POA: Diagnosis not present

## 2020-04-21 DIAGNOSIS — B952 Enterococcus as the cause of diseases classified elsewhere: Secondary | ICD-10-CM | POA: Diagnosis present

## 2020-04-21 DIAGNOSIS — Z20822 Contact with and (suspected) exposure to covid-19: Secondary | ICD-10-CM | POA: Diagnosis not present

## 2020-04-21 DIAGNOSIS — I2510 Atherosclerotic heart disease of native coronary artery without angina pectoris: Secondary | ICD-10-CM | POA: Diagnosis present

## 2020-04-21 DIAGNOSIS — I5033 Acute on chronic diastolic (congestive) heart failure: Secondary | ICD-10-CM | POA: Diagnosis not present

## 2020-04-21 DIAGNOSIS — Z7902 Long term (current) use of antithrombotics/antiplatelets: Secondary | ICD-10-CM | POA: Diagnosis not present

## 2020-04-21 DIAGNOSIS — E876 Hypokalemia: Secondary | ICD-10-CM | POA: Diagnosis not present

## 2020-04-21 DIAGNOSIS — I2694 Multiple subsegmental pulmonary emboli without acute cor pulmonale: Secondary | ICD-10-CM | POA: Diagnosis not present

## 2020-04-21 DIAGNOSIS — I5023 Acute on chronic systolic (congestive) heart failure: Secondary | ICD-10-CM | POA: Diagnosis not present

## 2020-04-21 DIAGNOSIS — I517 Cardiomegaly: Secondary | ICD-10-CM | POA: Diagnosis not present

## 2020-04-21 DIAGNOSIS — I50813 Acute on chronic right heart failure: Secondary | ICD-10-CM

## 2020-04-21 DIAGNOSIS — Z833 Family history of diabetes mellitus: Secondary | ICD-10-CM | POA: Diagnosis not present

## 2020-04-21 DIAGNOSIS — Z87891 Personal history of nicotine dependence: Secondary | ICD-10-CM

## 2020-04-21 DIAGNOSIS — Z953 Presence of xenogenic heart valve: Secondary | ICD-10-CM | POA: Diagnosis not present

## 2020-04-21 DIAGNOSIS — E871 Hypo-osmolality and hyponatremia: Secondary | ICD-10-CM | POA: Diagnosis not present

## 2020-04-21 DIAGNOSIS — I444 Left anterior fascicular block: Secondary | ICD-10-CM | POA: Diagnosis present

## 2020-04-21 DIAGNOSIS — D630 Anemia in neoplastic disease: Secondary | ICD-10-CM | POA: Diagnosis not present

## 2020-04-21 DIAGNOSIS — J309 Allergic rhinitis, unspecified: Secondary | ICD-10-CM | POA: Diagnosis not present

## 2020-04-21 DIAGNOSIS — Z66 Do not resuscitate: Secondary | ICD-10-CM

## 2020-04-21 DIAGNOSIS — D649 Anemia, unspecified: Secondary | ICD-10-CM | POA: Diagnosis not present

## 2020-04-21 DIAGNOSIS — N136 Pyonephrosis: Secondary | ICD-10-CM | POA: Diagnosis present

## 2020-04-21 DIAGNOSIS — I739 Peripheral vascular disease, unspecified: Secondary | ICD-10-CM | POA: Diagnosis not present

## 2020-04-21 DIAGNOSIS — J90 Pleural effusion, not elsewhere classified: Secondary | ICD-10-CM

## 2020-04-21 DIAGNOSIS — Z85528 Personal history of other malignant neoplasm of kidney: Secondary | ICD-10-CM

## 2020-04-21 DIAGNOSIS — N390 Urinary tract infection, site not specified: Secondary | ICD-10-CM | POA: Diagnosis present

## 2020-04-21 DIAGNOSIS — H547 Unspecified visual loss: Secondary | ICD-10-CM | POA: Diagnosis present

## 2020-04-21 DIAGNOSIS — I10 Essential (primary) hypertension: Secondary | ICD-10-CM | POA: Diagnosis not present

## 2020-04-21 DIAGNOSIS — Z515 Encounter for palliative care: Secondary | ICD-10-CM

## 2020-04-21 DIAGNOSIS — Z743 Need for continuous supervision: Secondary | ICD-10-CM | POA: Diagnosis not present

## 2020-04-21 DIAGNOSIS — I700 Atherosclerosis of aorta: Secondary | ICD-10-CM | POA: Diagnosis not present

## 2020-04-21 DIAGNOSIS — Z825 Family history of asthma and other chronic lower respiratory diseases: Secondary | ICD-10-CM | POA: Diagnosis not present

## 2020-04-21 DIAGNOSIS — I371 Nonrheumatic pulmonary valve insufficiency: Secondary | ICD-10-CM | POA: Diagnosis not present

## 2020-04-21 DIAGNOSIS — J9811 Atelectasis: Secondary | ICD-10-CM | POA: Diagnosis not present

## 2020-04-21 DIAGNOSIS — I340 Nonrheumatic mitral (valve) insufficiency: Secondary | ICD-10-CM | POA: Diagnosis not present

## 2020-04-21 DIAGNOSIS — K219 Gastro-esophageal reflux disease without esophagitis: Secondary | ICD-10-CM | POA: Diagnosis not present

## 2020-04-21 DIAGNOSIS — I2699 Other pulmonary embolism without acute cor pulmonale: Secondary | ICD-10-CM | POA: Diagnosis not present

## 2020-04-21 DIAGNOSIS — Z7189 Other specified counseling: Secondary | ICD-10-CM | POA: Diagnosis not present

## 2020-04-21 DIAGNOSIS — Z955 Presence of coronary angioplasty implant and graft: Secondary | ICD-10-CM

## 2020-04-21 DIAGNOSIS — C689 Malignant neoplasm of urinary organ, unspecified: Secondary | ICD-10-CM | POA: Diagnosis not present

## 2020-04-21 DIAGNOSIS — I110 Hypertensive heart disease with heart failure: Secondary | ICD-10-CM | POA: Diagnosis present

## 2020-04-21 DIAGNOSIS — C7951 Secondary malignant neoplasm of bone: Secondary | ICD-10-CM | POA: Diagnosis not present

## 2020-04-21 DIAGNOSIS — I509 Heart failure, unspecified: Secondary | ICD-10-CM | POA: Diagnosis not present

## 2020-04-21 DIAGNOSIS — R0602 Shortness of breath: Secondary | ICD-10-CM | POA: Diagnosis not present

## 2020-04-21 DIAGNOSIS — I361 Nonrheumatic tricuspid (valve) insufficiency: Secondary | ICD-10-CM | POA: Diagnosis not present

## 2020-04-21 DIAGNOSIS — C7910 Secondary malignant neoplasm of unspecified urinary organs: Secondary | ICD-10-CM | POA: Diagnosis not present

## 2020-04-21 DIAGNOSIS — N1330 Unspecified hydronephrosis: Secondary | ICD-10-CM | POA: Diagnosis not present

## 2020-04-21 DIAGNOSIS — E785 Hyperlipidemia, unspecified: Secondary | ICD-10-CM | POA: Diagnosis not present

## 2020-04-21 DIAGNOSIS — F329 Major depressive disorder, single episode, unspecified: Secondary | ICD-10-CM | POA: Diagnosis present

## 2020-04-21 LAB — COMPREHENSIVE METABOLIC PANEL
ALT: 51 U/L — ABNORMAL HIGH (ref 0–44)
AST: 31 U/L (ref 15–41)
Albumin: 3 g/dL — ABNORMAL LOW (ref 3.5–5.0)
Alkaline Phosphatase: 378 U/L — ABNORMAL HIGH (ref 38–126)
Anion gap: 10 (ref 5–15)
BUN: 27 mg/dL — ABNORMAL HIGH (ref 8–23)
CO2: 23 mmol/L (ref 22–32)
Calcium: 7.8 mg/dL — ABNORMAL LOW (ref 8.9–10.3)
Chloride: 90 mmol/L — ABNORMAL LOW (ref 98–111)
Creatinine, Ser: 0.95 mg/dL (ref 0.61–1.24)
GFR calc Af Amer: >60 mL/min (ref >60)
GFR calc non Af Amer: >60 mL/min (ref >60)
Glucose, Bld: 104 mg/dL — ABNORMAL HIGH (ref 70–99)
Potassium: 5 mmol/L (ref 3.5–5.1)
Sodium: 123 mmol/L — ABNORMAL LOW (ref 135–145)
Total Bilirubin: 0.5 mg/dL (ref 0.3–1.2)
Total Protein: 6.1 g/dL — ABNORMAL LOW (ref 6.5–8.1)

## 2020-04-21 LAB — CBC WITH DIFFERENTIAL/PLATELET
Abs Immature Granulocytes: 0.08 10*3/uL — ABNORMAL HIGH (ref 0.00–0.07)
Basophils Absolute: 0 10*3/uL (ref 0.0–0.1)
Basophils Relative: 1 %
Eosinophils Absolute: 0.1 10*3/uL (ref 0.0–0.5)
Eosinophils Relative: 2 %
HCT: 29.2 % — ABNORMAL LOW (ref 39.0–52.0)
Hemoglobin: 9.4 g/dL — ABNORMAL LOW (ref 13.0–17.0)
Immature Granulocytes: 1 %
Lymphocytes Relative: 16 %
Lymphs Abs: 1 10*3/uL (ref 0.7–4.0)
MCH: 26.5 pg (ref 26.0–34.0)
MCHC: 32.2 g/dL (ref 30.0–36.0)
MCV: 82.3 fL (ref 80.0–100.0)
Monocytes Absolute: 1 10*3/uL (ref 0.1–1.0)
Monocytes Relative: 16 %
Neutro Abs: 4 10*3/uL (ref 1.7–7.7)
Neutrophils Relative %: 64 %
Platelets: 299 10*3/uL (ref 150–400)
RBC: 3.55 MIL/uL — ABNORMAL LOW (ref 4.22–5.81)
RDW: 27.2 % — ABNORMAL HIGH (ref 11.5–15.5)
WBC: 6.2 10*3/uL (ref 4.0–10.5)
nRBC: 0 % (ref 0.0–0.2)

## 2020-04-21 LAB — SARS CORONAVIRUS 2 BY RT PCR (HOSPITAL ORDER, PERFORMED IN ~~LOC~~ HOSPITAL LAB): SARS Coronavirus 2: NEGATIVE

## 2020-04-21 LAB — URINALYSIS, ROUTINE W REFLEX MICROSCOPIC
Bilirubin Urine: NEGATIVE
Glucose, UA: NEGATIVE mg/dL
Ketones, ur: NEGATIVE mg/dL
Nitrite: NEGATIVE
Protein, ur: 100 mg/dL — AB
Specific Gravity, Urine: 1.013 (ref 1.005–1.030)
pH: 6 (ref 5.0–8.0)

## 2020-04-21 LAB — TROPONIN I (HIGH SENSITIVITY)
Troponin I (High Sensitivity): 32 ng/L — ABNORMAL HIGH (ref <18)
Troponin I (High Sensitivity): 32 ng/L — ABNORMAL HIGH (ref <18)

## 2020-04-21 LAB — BRAIN NATRIURETIC PEPTIDE: B Natriuretic Peptide: >4500 pg/mL — ABNORMAL HIGH (ref 0.0–100.0)

## 2020-04-21 MED ORDER — IOHEXOL 350 MG/ML SOLN
75.0000 mL | Freq: Once | INTRAVENOUS | Status: AC | PRN
  Administered 2020-04-21: 75 mL via INTRAVENOUS

## 2020-04-21 MED ORDER — POTASSIUM CHLORIDE CRYS ER 10 MEQ PO TBCR
10.0000 meq | EXTENDED_RELEASE_TABLET | Freq: Every day | ORAL | Status: DC
  Administered 2020-04-22 – 2020-04-24 (×3): 10 meq via ORAL
  Filled 2020-04-21 (×4): qty 1

## 2020-04-21 MED ORDER — FUROSEMIDE 10 MG/ML IJ SOLN
80.0000 mg | Freq: Once | INTRAMUSCULAR | Status: AC
  Administered 2020-04-21: 80 mg via INTRAVENOUS
  Filled 2020-04-21: qty 8

## 2020-04-21 MED ORDER — NITROGLYCERIN 0.4 MG SL SUBL: 0.4000 mg | SUBLINGUAL_TABLET | SUBLINGUAL | Status: DC | PRN

## 2020-04-21 MED ORDER — ATORVASTATIN CALCIUM 40 MG PO TABS
40.0000 mg | ORAL_TABLET | Freq: Every day | ORAL | Status: DC
  Administered 2020-04-21 – 2020-04-25 (×5): 40 mg via ORAL
  Filled 2020-04-21 (×5): qty 1

## 2020-04-21 MED ORDER — SODIUM CHLORIDE 0.9% FLUSH
3.0000 mL | Freq: Two times a day (BID) | INTRAVENOUS | Status: DC
  Administered 2020-04-23 – 2020-04-25 (×3): 3 mL via INTRAVENOUS

## 2020-04-21 MED ORDER — FLUTICASONE PROPIONATE 50 MCG/ACT NA SUSP
2.0000 | Freq: Every day | NASAL | Status: DC
  Administered 2020-04-23 – 2020-04-25 (×3): 2 via NASAL
  Filled 2020-04-21 (×2): qty 16

## 2020-04-21 MED ORDER — DOCUSATE SODIUM 100 MG PO CAPS
100.0000 mg | ORAL_CAPSULE | Freq: Two times a day (BID) | ORAL | Status: DC
  Administered 2020-04-21 – 2020-04-25 (×8): 100 mg via ORAL
  Filled 2020-04-21 (×8): qty 1

## 2020-04-21 MED ORDER — SODIUM CHLORIDE 0.9% FLUSH
3.0000 mL | Freq: Two times a day (BID) | INTRAVENOUS | Status: DC
  Administered 2020-04-23 – 2020-04-24 (×2): 3 mL via INTRAVENOUS

## 2020-04-21 MED ORDER — PROCHLORPERAZINE MALEATE 5 MG PO TABS: 10.0000 mg | ORAL_TABLET | Freq: Four times a day (QID) | ORAL | Status: DC | PRN

## 2020-04-21 MED ORDER — HEPARIN BOLUS VIA INFUSION
3600.0000 [IU] | Freq: Once | INTRAVENOUS | Status: AC
  Administered 2020-04-21: 3600 [IU] via INTRAVENOUS

## 2020-04-21 MED ORDER — CLONAZEPAM 0.5 MG PO TABS
0.5000 mg | ORAL_TABLET | Freq: Every day | ORAL | Status: DC
  Administered 2020-04-21 – 2020-04-24 (×4): 0.5 mg via ORAL
  Filled 2020-04-21 (×4): qty 1

## 2020-04-21 MED ORDER — SODIUM CHLORIDE 0.9% FLUSH: 3.0000 mL | INTRAVENOUS | Status: DC | PRN

## 2020-04-21 MED ORDER — SODIUM CHLORIDE 0.9 % IV SOLN: 250.0000 mL | INTRAVENOUS | Status: DC | PRN

## 2020-04-21 MED ORDER — METOPROLOL SUCCINATE ER 50 MG PO TB24
50.0000 mg | ORAL_TABLET | Freq: Every day | ORAL | Status: DC
  Administered 2020-04-22 – 2020-04-25 (×4): 50 mg via ORAL
  Filled 2020-04-21: qty 1
  Filled 2020-04-21: qty 2
  Filled 2020-04-21 (×2): qty 1

## 2020-04-21 MED ORDER — PANTOPRAZOLE SODIUM 40 MG PO TBEC
40.0000 mg | DELAYED_RELEASE_TABLET | Freq: Every day | ORAL | Status: DC
  Administered 2020-04-22 – 2020-04-25 (×4): 40 mg via ORAL
  Filled 2020-04-21 (×4): qty 1

## 2020-04-21 MED ORDER — FUROSEMIDE 10 MG/ML IJ SOLN
40.0000 mg | Freq: Four times a day (QID) | INTRAMUSCULAR | Status: AC
  Administered 2020-04-21 – 2020-04-22 (×4): 40 mg via INTRAVENOUS
  Filled 2020-04-21 (×4): qty 4

## 2020-04-21 MED ORDER — HEPARIN (PORCINE) 25000 UT/250ML-% IV SOLN
850.0000 [IU]/h | INTRAVENOUS | Status: AC
  Administered 2020-04-21 – 2020-04-22 (×2): 1100 [IU]/h via INTRAVENOUS
  Administered 2020-04-23: 950 [IU]/h via INTRAVENOUS
  Administered 2020-04-24: 850 [IU]/h via INTRAVENOUS
  Filled 2020-04-21 (×4): qty 250

## 2020-04-21 MED ORDER — LISINOPRIL 5 MG PO TABS
5.0000 mg | ORAL_TABLET | Freq: Every day | ORAL | Status: DC
  Administered 2020-04-22 – 2020-04-25 (×4): 5 mg via ORAL
  Filled 2020-04-21 (×4): qty 1

## 2020-04-21 MED ORDER — MAGNESIUM OXIDE 400 (241.3 MG) MG PO TABS
400.0000 mg | ORAL_TABLET | Freq: Two times a day (BID) | ORAL | Status: DC
  Administered 2020-04-21 – 2020-04-25 (×8): 400 mg via ORAL
  Filled 2020-04-21 (×8): qty 1

## 2020-04-21 MED ORDER — ACETAMINOPHEN 325 MG PO TABS
650.0000 mg | ORAL_TABLET | Freq: Four times a day (QID) | ORAL | Status: DC | PRN
  Administered 2020-04-22 – 2020-04-23 (×2): 650 mg via ORAL
  Filled 2020-04-21 (×3): qty 2

## 2020-04-21 MED ORDER — SPIRONOLACTONE 12.5 MG HALF TABLET
12.5000 mg | ORAL_TABLET | Freq: Every day | ORAL | Status: DC
  Administered 2020-04-21 – 2020-04-25 (×5): 12.5 mg via ORAL
  Filled 2020-04-21 (×8): qty 1

## 2020-04-21 MED ORDER — MONTELUKAST SODIUM 10 MG PO TABS
10.0000 mg | ORAL_TABLET | Freq: Every day | ORAL | Status: DC
  Administered 2020-04-21 – 2020-04-24 (×4): 10 mg via ORAL
  Filled 2020-04-21 (×4): qty 1

## 2020-04-21 MED ORDER — AMOXICILLIN-POT CLAVULANATE 875-125 MG PO TABS
1.0000 | ORAL_TABLET | Freq: Two times a day (BID) | ORAL | Status: DC
  Administered 2020-04-21 – 2020-04-25 (×8): 1 via ORAL
  Filled 2020-04-21 (×8): qty 1

## 2020-04-21 MED ORDER — ONDANSETRON HCL 4 MG/2ML IJ SOLN
4.0000 mg | Freq: Four times a day (QID) | INTRAMUSCULAR | Status: DC | PRN
  Administered 2020-04-24: 4 mg via INTRAVENOUS
  Filled 2020-04-21: qty 2

## 2020-04-21 NOTE — ED Notes (Signed)
Pt requesting food- pt given meal tray.

## 2020-04-21 NOTE — ED Provider Notes (Addendum)
[facility] EMERGENCY DEPARTMENT Provider Note   CSN: [id number] Arrival date & time: 04/21/20  1304     History Chief Complaint  Patient presents with  . Shortness of Breath    [name] is a 69 y.o. male with a history of CAD with PCI, history of femoral artery embolus, also history of DVT, with recent diagnosis of kidney cancer currently with bilateral nephrostomy tubes, was discharge from the hospital 6 days ago secondary to shortness of breath  secondary to CHF, returns today for worsening shortness of breath.  He states he has had a slowly progressive worsening of symptoms, currently having difficulty speaking in full sentences, states his symptoms are worse at night and worse with exertion, better but not great when he is sitting in an upright position.  He denies fevers or chills.  He woke this morning with some dry heaves and endorses reduced appetite, but no current nausea or vomiting.  He describes a midsternal chest pressure sensation which is worse with cough episodes which have been nonproductive.  He is scheduled for his next chemotherapy treatment tomorrow morning.  He endorses bilateral lower extremity edema which is chronic but worsened today, stating he has swelling to his knees.  He has been taking his increased Lasix 40 mg twice daily dosing.  He has not been checking his weights.  Denies exposure to COVID-19 and has been fully vaccinated.  HPI     Past Medical History:  Diagnosis Date  . Arterial embolus of lower extremity ([place]) 11/23/2013  . Arthritis   . CAD (coronary artery disease)    a. s/p PCI with 3.5 x 18 BMS to mid RCA, non obst LAD, Lcx patent 08/10/13  . Cancer ([place]) 2021   bladder  . Depression, major, single episode, mild ([place]) 12/20/2019  . ED (erectile dysfunction) 03/27/2015  . Embolus of femoral artery [facility]) 2012   Surgical repair, previously on Coumadin  . Essential hypertension   . ETOH abuse   . Hematuria 10/17/2019  . Hernia of abdominal  cavity 02/27/2019  . History of DVT (deep vein thrombosis) 2011  . History of GI bleed    February 2012   . Ingrown left greater toenail 09/04/2018  . 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 Low oxygen saturation 01/31/2020  . 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 Pain with urination 10/17/2019  . Port-A-Cath in place 02/23/2020  . Prosthetic valve endocarditis ([place])   . PVD (peripheral vascular disease) ([place])   . S/P aortic valve replacement    Details not clear -  reportedly 1971 per patient    Patient Active Problem List   Diagnosis Date Noted  . Hyponatremia   . Transaminitis   . CHF exacerbation ([place]) 04/12/2020  . Hydronephrosis 04/01/2020  . Malignant neoplasm of overlapping sites of bladder ([place]) 03/29/2020  . Iron deficiency anemia 03/27/2020  . Nausea with vomiting 03/27/2020  . Encounter for support and coordination of transition of care 03/19/2020  . UTI (urinary tract infection) 03/06/2020  . Port-A-Cath in place 02/23/2020  . Hydrocele 02/21/2020  . Metastatic urothelial carcinoma ([place]) 02/13/2020  . Goals of care, counseling/discussion 02/13/2020  . Chronic anemia 01/31/2020  . Acute on chronic HFrEF (heart failure with reduced ejection fraction) -combined systolic and diastolic dysfunction CHF 18/84/1660  . Nervously anxious 12/20/2019  . Depression, major, single episode, mild ([place]) 12/20/2019  . Weight  loss, non-intentional 10/17/2019  . Annual visit for general adult medical examination with abnormal findings 09/12/2019  . Supraclavicular lymphadenopathy 09/12/2019  . Grade 3 out of 6 intensity murmur 09/12/2019  . Bilateral recurrent inguinal hernias 03/08/2019  . Claudication in peripheral vascular disease ([place]) 10/31/2018  . Peripheral arterial disease ([place]) 10/26/2018  . Shortness of breath on exertion 10/16/2017  . Severe aortic insufficiency 10/02/2017  . Allergic rhinitis  12/30/2015  . Hyperlipidemia LDL goal <70 08/25/2013  . Aortic atherosclerosis ([place]) 08/12/2013  . Coronary atherosclerosis of native coronary artery 08/10/2013  . 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 S/P aortic valve replacement   . Essential hypertension, benign 05/13/2010    Past Surgical History:  Procedure Laterality Date  . ABDOMINAL AORTOGRAM W/LOWER EXTREMITY N/A 10/31/2018   Procedure: ABDOMINAL AORTOGRAM W/LOWER EXTREMITY;  Surgeon: Lorretta Harp, MD;  Location: [place] CV LAB;  Service: Cardiovascular;  Laterality: N/A;  . Aoric valve replacement  January 2005   97mm Medtronic Freestyle stentless porcine bioprosthesis [facility])  . CARDIAC CATHETERIZATION     30-40% mid LAD, 20-30% ostial D1, proximal 30-40% RCA, 95% mid RCA thrombus, 40% distal stenosis. EF 45%, inferior HK.  [name] COLONOSCOPY N/A 04/20/2014   Procedure: COLONOSCOPY;  Surgeon: Rogene Houston, MD;  [facility];  Service: Endoscopy;  Laterality: N/A;  930  . COLONOSCOPY WITH PROPOFOL N/A 02/02/2020   Procedure: COLONOSCOPY WITH PROPOFOL;  Surgeon: Rogene Houston, MD;  [facility]: [facility];  Service: Endoscopy;  Laterality: N/A;  . CORONARY ANGIOPLASTY WITH STENT PLACEMENT  07/2013  . ESOPHAGOGASTRODUODENOSCOPY (EGD) WITH PROPOFOL N/A 02/02/2020   Procedure: ESOPHAGOGASTRODUODENOSCOPY (EGD) WITH PROPOFOL;  Surgeon: Rogene Houston, MD;  [facility];  Service: Endoscopy;  Laterality: N/A;  . INGUINAL HERNIA REPAIR Bilateral 04/17/2019   Procedure: ATTEMPTED LAPAROSCOPIC BILATERAL INGUINAL HERNIA REPAIR;  Surgeon: Coralie Keens, MD;  Location: [place];  Service: General;  Laterality: Bilateral;  . INGUINAL HERNIA REPAIR Bilateral 04/17/2019   Procedure: Hernia Repair Inguinal Adult;  Surgeon: Coralie Keens, MD;  Location: [place];  Service: General;  Laterality: Bilateral;  . IR IMAGING GUIDED PORT INSERTION  02/22/2020  . IR NEPHROSTOMY EXCHANGE LEFT  04/05/2020  . IR NEPHROSTOMY  EXCHANGE RIGHT  04/05/2020  . IR NEPHROSTOMY PLACEMENT LEFT  02/22/2020  . IR NEPHROSTOMY PLACEMENT RIGHT  02/22/2020  . LEFT HEART CATHETERIZATION WITH CORONARY ANGIOGRAM N/A 08/10/2013   Procedure: LEFT HEART CATHETERIZATION WITH CORONARY ANGIOGRAM;  Surgeon: Blane Ohara, MD;  Location: [facility] CATH LAB;  Service: Cardiovascular;  Laterality: N/A;  . Left leg embolectomy  May 2012   Dr. Trula Slade - reason for lifelong coumadin  . LYMPH NODE BIOPSY Left 11/21/2019   Procedure: Left Neck LYMPH NODE BIOPSY;  Surgeon: Rozetta Nunnery, MD;  Location: [place];  Service: ENT;  Laterality: Left;  . PERIPHERAL VASCULAR ATHERECTOMY  10/31/2018   Procedure: PERIPHERAL VASCULAR ATHERECTOMY;  Surgeon: Lorretta Harp, MD;  Location: [place] CV LAB;  Service: Cardiovascular;;  left SFA  . POLYPECTOMY  02/02/2020   Procedure: POLYPECTOMY;  Surgeon: Rogene Houston, MD;  [facility];  Service: Endoscopy;;  splenic flexure       Family History  Problem Relation Age of Onset  . Diabetes Mother   . Cirrhosis Father   . COPD Sister     Social History   Tobacco Use  . Smoking status: Former Smoker    Packs/day: 0.50    Years: 3.00  Pack years: 1.50    Types: Cigarettes    Start date: 10/12/1965    Quit date: 02/19/1970    Years since quitting: 50.2  . Smokeless tobacco: Never Used  Vaping Use  . Vaping Use: Never used  Substance Use Topics  . Alcohol use: Not Currently  . Drug use: Yes    Types: Marijuana    Comment: pt smokes weed once in the morning and once in the evening    Home Medications Prior to Admission medications   Medication Sig Start Date End Date Taking? Authorizing Provider  acetaminophen (TYLENOL) 325 MG tablet Take 650 mg by mouth in the morning and at bedtime.   Yes [provider]  amoxicillin-clavulanate (AUGMENTIN) 875-125 MG tablet Take 1 tablet by mouth every 12 (twelve) hours for 4 days. 04/17/20 04/21/20 Yes Shah, Pratik D, DO  aspirin EC 81  MG tablet Take 1 tablet (81 mg total) by mouth daily. 11/01/17  Yes [name], MD  atorvastatin (LIPITOR) 40 MG tablet Take 1 tablet (40 mg total) by mouth daily. 03/21/20  Yes Perlie Mayo, NP  cholecalciferol (VITAMIN D) 1000 units tablet Take 1 tablet (1,000 Units total) by mouth daily. 11/01/17  Yes [name], MD  clonazePAM (KLONOPIN) 0.5 MG tablet Take 1 tablet (0.5 mg total) by mouth at bedtime. 04/13/20  Yes Derek Jack, MD  clopidogrel (PLAVIX) 75 MG tablet Take 1 tablet by mouth once daily Patient taking differently: Take 75 mg by mouth daily.  03/21/20  Yes [name], MD  fluticasone (FLONASE) 50 MCG/ACT nasal spray Use 2 spray(s) in each nostril once daily Patient taking differently: Place 2 sprays into both nostrils daily.  03/22/20  Yes Perlie Mayo, NP  furosemide (LASIX) 40 MG tablet Take 1 tablet (40 mg total) by mouth 2 (two) times daily. Patient taking differently: Take 20-40 mg by mouth 2 (two) times daily. Take 1 tablet(40mg ) in the morning and 1/2(20mg ) tablet in the evening. 04/15/20  Yes Johnson, Clanford L, MD  lisinopril (ZESTRIL) 5 MG tablet Take 1 tablet (5 mg total) by mouth daily. 04/16/20 06/15/20 Yes Johnson, Clanford L, MD  magnesium oxide (MAG-OX) 400 (241.3 Mg) MG tablet Take 1 tablet (400 mg total) by mouth 2 (two) times daily. 04/04/20  Yes Derek Jack, MD  metoprolol succinate (TOPROL-XL) 50 MG 24 hr tablet TAKE 1 TABLET BY MOUTH ONCE DAILY WITH  OR  IMMEDIATELY  FOLLOWING  A  MEAL Patient taking differently: Take 50 mg by mouth daily. WITH  OR  IMMEDIATELY  FOLLOWING  A  MEAL 03/22/20  Yes Perlie Mayo, NP  montelukast (SINGULAIR) 10 MG tablet Take 1 tablet (10 mg total) by mouth at bedtime. 03/22/20  Yes Perlie Mayo, NP  nitroGLYCERIN (NITROSTAT) 0.4 MG SL tablet Place 1 tablet (0.4 mg total) under the tongue every 5 (five) minutes as needed for chest pain. 12/20/19  Yes Lorretta Harp, MD  potassium chloride  (KLOR-CON) 10 MEQ tablet Take 1 tablet (10 mEq total) by mouth every other day. Patient taking differently: Take 10 mEq by mouth daily.  04/22/20 05/22/20 Yes Johnson, Clanford L, MD  prochlorperazine (COMPAZINE) 10 MG tablet Take 1 tablet (10 mg total) by mouth every 6 (six) hours as needed (Nausea or vomiting). 03/28/20  Yes Derek Jack, MD  trolamine salicylate (ASPERCREME) 10 % cream Apply 1 application topically daily as needed for muscle pain (for pain in feet). For pain in legs (swelling)   Yes  [provider]  busPIRone (BUSPAR) 10 MG tablet Take 1 tablet (10 mg total) by mouth 2 (two) times daily. Patient not taking: Reported on 04/12/2020 12/20/19   Perlie Mayo, NP  escitalopram (LEXAPRO) 10 MG tablet Take 1 tablet (10 mg total) by mouth daily. Patient not taking: Reported on 04/12/2020 12/20/19   Perlie Mayo, NP  pantoprazole (PROTONIX) 40 MG tablet Take 1 tablet (40 mg total) by mouth daily before breakfast. Patient not taking: Reported on 04/12/2020 02/03/20   Damita Lack, MD    Allergies    Patient has no known allergies.  Review of Systems   Review of Systems  Constitutional: Positive for appetite change. Negative for chills and fever.  HENT: Negative for congestion and sore throat.   Eyes: Negative.   Respiratory: Positive for chest tightness and shortness of breath. Negative for cough and wheezing.   Cardiovascular: Negative for chest pain.  Gastrointestinal: Positive for nausea. Negative for abdominal pain and vomiting.  Genitourinary:       Nephrostomy tubes  Musculoskeletal: Negative for arthralgias, joint swelling and neck pain.  Skin: Negative.  Negative for rash and wound.  Neurological: Negative for dizziness, weakness, light-headedness, numbness and headaches.  Psychiatric/Behavioral: Negative.     Physical Exam Updated Vital Signs BP (!) 142/96 (BP Location: Right Arm)   Pulse 67   Resp 15   Ht 5\' 4"  (1.626 m)   Wt 60.3 kg   SpO2  100%   BMI 22.82 kg/m   Physical Exam Vitals and nursing note reviewed.  Constitutional:      Appearance: He is well-developed.  HENT:     Head: Normocephalic and atraumatic.  Eyes:     Conjunctiva/sclera: Conjunctivae normal.  Cardiovascular:     Rate and Rhythm: Normal rate and regular rhythm.     Heart sounds: Normal heart sounds.  Pulmonary:     Effort: Pulmonary effort is normal.     Breath sounds: Examination of the right-lower field reveals rales. Rales present. No decreased breath sounds, wheezing or rhonchi.  Chest:     Chest wall: No tenderness.  Abdominal:     General: Bowel sounds are normal.     Palpations: Abdomen is soft.     Tenderness: There is no abdominal tenderness.  Genitourinary:    Comments: Nephrostomy tubes in place Musculoskeletal:        General: Normal range of motion.     Cervical back: Normal range of motion.     Right lower leg: Edema present.     Left lower leg: Edema present.     Comments: Pitting edema bilateral to upper tibia.    Skin:    General: Skin is warm and dry.  Neurological:     Mental Status: He is alert.     ED Results / Procedures / Treatments   Labs (all labs ordered are listed, but only abnormal results are displayed) Labs Reviewed  COMPREHENSIVE METABOLIC PANEL - Abnormal; Notable for the following components:      Result Value   Sodium 123 (*)    Chloride 90 (*)    Glucose, Bld 104 (*)    BUN 27 (*)    Calcium 7.8 (*)    Total Protein 6.1 (*)    Albumin 3.0 (*)    ALT 51 (*)    Alkaline Phosphatase 378 (*)    All other components within normal limits  CBC WITH DIFFERENTIAL/PLATELET - Abnormal; Notable for the following components:  RBC 3.55 (*)    Hemoglobin 9.4 (*)    HCT 29.2 (*)    RDW 27.2 (*)    Abs Immature Granulocytes 0.08 (*)    All other components within normal limits  BRAIN NATRIURETIC PEPTIDE - Abnormal; Notable for the following components:   B Natriuretic Peptide >4,500.0 (*)    All  other components within normal limits  URINALYSIS, ROUTINE W REFLEX MICROSCOPIC - Abnormal; Notable for the following components:   APPearance HAZY (*)    Hgb urine dipstick MODERATE (*)    Protein, ur 100 (*)    Leukocytes,Ua LARGE (*)    Bacteria, UA MANY (*)    All other components within normal limits  TROPONIN I (HIGH SENSITIVITY) - Abnormal; Notable for the following components:   Troponin I (High Sensitivity) 32 (*)    All other components within normal limits  TROPONIN I (HIGH SENSITIVITY) - Abnormal; Notable for the following components:   Troponin I (High Sensitivity) 32 (*)    All other components within normal limits  HEPARIN LEVEL (UNFRACTIONATED)  CBC    EKG EKG Interpretation  Date/Time:  Sunday April 21 2020 13:09:37 EDT Ventricular Rate:  71 PR Interval:    QRS Duration: 110 QT Interval:  420 QTC Calculation: 457 R Axis:   -86 Text Interpretation: Sinus rhythm Prolonged PR interval LAD, consider left anterior fascicular block LVH with secondary repolarization abnormality Non-specific ST-t changes No significant change since last tracing Confirmed by Lajean Saver [phone]) on 04/21/2020 2:24:34 PM   Radiology CT Angio Chest PE W and/or Wo Contrast  Result Date: 04/21/2020 CLINICAL DATA:  Increasing shortness of breath, history of bladder cancer, DVT EXAM: CT ANGIOGRAPHY CHEST WITH CONTRAST TECHNIQUE: Multidetector CT imaging of the chest was performed using the standard protocol during bolus administration of intravenous contrast. Multiplanar CT image reconstructions and MIPs were obtained to evaluate the vascular anatomy. CONTRAST:  77mL OMNIPAQUE IOHEXOL 350 MG/ML SOLN COMPARISON:  CT 02/02/2020 FINDINGS: Cardiovascular: Satisfactory opacification of the pulmonary arteries to the segmental level. Suspect filling defects within the segmental branches of the posterior basal segment right lower lobe (5/190) though difficult to fully assess given hypoventilatory  changes/passive atelectasis in this region as well as some respiratory motion artifact. No other convincing pulmonary arterial filling defects additional peripherally marginated segmental filling defects seen in the anterior segmental branches left upper lobe (5/79) and lateral basal segmental branch of the left lower lobe (5/172). Central pulmonary arteries remain normal caliber. Diffuse cardiomegaly with biatrial enlargement and reflux of contrast into the IVC and hepatic veins. Transcatheter aortic valve replacement is in position. Coronary artery calcifications are present. Rib right IJ Port-A-Cath tip terminates at the level of the superior cavoatrial junction. No other major venous abnormalities. Mediastinum/Nodes: Postsurgical changes from prior sternotomy. No mediastinal fluid or gas. Normal thyroid gland and thoracic inlet. No acute abnormality of the trachea or esophagus. No worrisome mediastinal, hilar or axillary adenopathy. Lungs/Pleura: Moderate right pleural effusion with subtotal atelectatic collapse of the right lower lobe with further subsegmental atelectatic changes in the right upper and middle lobes as well. Stable biapical pleuroparenchymal scarring and blebs with calcified right apical granuloma. Upper Abdomen: Coil pack seen in the central liver. Bilateral percutaneous nephrostomies. Small volume upper abdominal ascites. Musculoskeletal: Diffuse body wall edema. New extensive multifocal areas of sclerotic osseous metastatic disease throughout the axial skeleton, sternum and ribs as well as the bilateral scapula and right humerus. No acute fracture. Postsurgical changes from prior sternotomy with intact sternal  sutures and bony fusion across the sternum. No worrisome chest wall lesions. Review of the MIP images confirms the above findings. IMPRESSION: 1. Segmental and subsegmental filling defects in the posterior basal segment right lower lobe, anterior segment left upper lobe and basal  segmental left lower lobe. 2. Cardiomegaly with biatrial enlargement and reflux of contrast into the IVC and hepatic veins, suggestive of elevated right heart pressure/right heart failure. 3. Moderate right pleural effusion with subtotal atelectatic collapse of the right lower lobe with further subsegmental atelectatic changes in the right upper and middle lobes as well. Possibly related to volume status though malignant effusion is not excluded. 4. Additional features of anasarca with circumferential body wall edema and upper abdominal ascites. 5. Extensive new multifocal areas of sclerotic osseous metastatic disease. 6. Prior transcatheter aortic valve replacement as well as sternotomy. 7. Coronary artery atherosclerosis. 8. Bilateral percutaneous nephrostomies. 9. Aortic Atherosclerosis (ICD10-I70.0). Electronically Signed   By: Lovena Le M.D.   On: 04/21/2020 16:18   DG Chest Port 1 View  Result Date: 04/21/2020 CLINICAL DATA:  Increasing shortness of breath since last night. EXAM: PORTABLE CHEST 1 VIEW COMPARISON:  Radiograph 04/12/2020.  CT 02/02/2020 FINDINGS: Accessed right chest port in place. Post median sternotomy. Stable cardiomegaly and prosthetic aortic valve/root. There is new volume loss at the right lung base with ill-defined opacity. Minimal fluid in the right minor fissure versus adjacent atelectasis. The left lung is clear. There is no pulmonary edema. No pneumothorax. Blunting of the right costophrenic angle may be chronic or small effusion. IMPRESSION: 1. New volume loss at the right lung base with ill-defined opacity. This may represent atelectasis, pneumonia or potentially aspiration in the appropriate clinical setting. Possible small right pleural effusion. 2. Stable cardiomegaly. Electronically Signed   By: Keith Rake M.D.   On: 04/21/2020 15:03    Procedures Procedures (including critical care time)  Medications Ordered in ED Medications  heparin bolus via infusion  3,600 Units (3,600 Units Intravenous Bolus from Bag 04/21/20 1753)    Followed by  heparin ADULT infusion 100 units/mL (25000 units/255mL sodium chloride 0.45%) (1,100 Units/hr Intravenous New Bag/Given 04/21/20 1753)  iohexol (OMNIPAQUE) 350 MG/ML injection 75 mL (75 mLs Intravenous Contrast Given 04/21/20 1549)  furosemide (LASIX) injection 80 mg (80 mg Intravenous Given 04/21/20 1635)    ED Course  I have reviewed the triage vital signs and the nursing notes.  Pertinent labs & imaging results that were available during my care of the patient were reviewed by me and considered in my medical decision making (see chart for details).    MDM Rules/Calculators/A&P                          Patient with worsening shortness of breath with multiple probable causes, he has an exacerbation of his CHF, complicated by effusion, unable to rule out malignant effusion, CT scan also revealing for advancing metastatic disease.  CT scan also shows multiple subsegmental distal lung pulmonary emboli.  He was given an IV dose of Lasix for fluid mobilization, also heparin was ordered per pharmacy consult.  Discussed patient with Dr. Linda Hedges who accepts patient for admission.  Of note patient states he was scheduled for his next chemo therapy treatment tomorrow. Final Clinical Impression(s) / ED Diagnoses Final diagnoses:  Acute on chronic right-sided congestive heart failure (HCC)  Hyponatremia  Chronic anemia  Pleural effusion  Multiple subsegmental pulmonary emboli without acute cor pulmonale ([place])  Rx / DC Orders ED Discharge Orders    None       Landis Martins 04/21/20 1841    Evalee Jefferson, PA-C 04/21/20 1845    Lajean Saver, MD 04/25/20 1044

## 2020-04-21 NOTE — ED Notes (Signed)
Pt provided extra warm blankets and resting in bed at this time.

## 2020-04-21 NOTE — ED Triage Notes (Signed)
Patient presents to the ED via [facility] due to increased shortness of breath since last night.  EMS reports patient saturation at 98% room air, but became short of breath when ambulating up to stretcher.  EMS reports normal vital signs, CBG of 128.  Patient denies covid exposure and has had the covid vaccines.

## 2020-04-21 NOTE — Progress Notes (Signed)
ANTICOAGULATION CONSULT NOTE - Initial Consult  Pharmacy Consult for Heparin Indication: pulmonary embolus  No Known Allergies  Patient Measurements: Height: 5\' 4"  (162.6 cm) Weight: 60.3 kg (132 lb 15 oz) IBW/kg (Calculated) : 59.2 HEPARIN DW (KG): 60.3  Vital Signs: BP: 142/96 (08/01 1441) Pulse Rate: 67 (08/01 1441)  Labs: Recent Labs    04/21/20 1412 04/21/20 1630  HGB 9.4*  --   HCT 29.2*  --   PLT 299  --   CREATININE 0.95  --   TROPONINIHS 32* 32*    Estimated Creatinine Clearance: 62.3 mL/min (by C-G formula based on SCr of 0.95 mg/dL).   Medical History: Past Medical History:  Diagnosis Date  . Arterial embolus of lower extremity ([place]) 11/23/2013  . Arthritis   . CAD (coronary artery disease)    a. s/p PCI with 3.5 x 18 BMS to mid RCA, non obst LAD, Lcx patent 08/10/13  . Cancer ([place]) 2021   bladder  . Depression, major, single episode, mild ([place]) 12/20/2019  . ED (erectile dysfunction) 03/27/2015  . Embolus of femoral artery [facility]) 2012   Surgical repair, previously on Coumadin  . Essential hypertension   . ETOH abuse   . Hematuria 10/17/2019  . Hernia of abdominal cavity 02/27/2019  . History of DVT (deep vein thrombosis) 2011  . History of GI bleed    February 2012   . Ingrown left greater toenail 09/04/2018  . 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 Low oxygen saturation 01/31/2020  . 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 Pain with urination 10/17/2019  . Port-A-Cath in place 02/23/2020  . Prosthetic valve endocarditis ([place])   . PVD (peripheral vascular disease) ([place])   . S/P aortic valve replacement    Details not clear -  reportedly 1971 per patient    Medications:  See med rec  Assessment: 69 y.o. male with a history of CAD with PCI, history of femoral artery embolus, also history of DVT, with recent diagnosis of kidney cancer currently with bilateral nephrostomy  tubes, was discharged from the hospital 6 days ago. He returns today for worsening shortness of breath.  He states he has had a slowly progressive worsening of symptoms. ED MD is concerned with PE, pharmacy asked to start heparin. Reviewed home medications and is not currently on any oral anticoagulants.  Goal of Therapy:  Heparin level 0.3-0.7 units/ml Monitor platelets by anticoagulation protocol: Yes   Plan:  Give 3600 units bolus x 1 Start heparin infusion at 1100 units/hr Check anti-Xa level in ~6-8 hours and daily while on heparin Continue to monitor H&H and platelets  Isac Sarna, BS Vena [place], BCPS Clinical Pharmacist Pager [phone] 04/21/2020,5:31 PM

## 2020-04-21 NOTE — H&P (Signed)
History and Physical    Bruce BASNETT FWY:[id number] DOB: 07/10/1951 DOA: 04/21/2020  PCP: [name], MD (Confirm with patient/family/NH records and if not entered, this has to be entered at [facility] point of entry) Patient coming from: home  I have personally briefly reviewed patient's old medical records in [place]  Chief Complaint: progressive SOB/DOE  HPI: [name] is a 69 y.o. male with medical history significant of CAD with PCI, history of femoral artery embolus, also history of DVT, with recent diagnosis of urothelial cancer with hydronephrosis 2/2 distal obstruction currently with bilateral nephrostomy tubes. He was discharged from the hospital 04/15/20 ago having been admitted for shortness of breath  secondary to CHF, returns today for worsening shortness of breath and UTI. He was to take Augmentin, based on culture ss but had not started.  He states he has had a slowly progressive worsening of symptoms, currently having difficulty speaking in full sentences, states his symptoms are worse at night and worse with exertion, better but not great when he is sitting in an upright position.  He denies fevers or chills.  He woke this morning with some dry heaves and endorses reduced appetite which has been a chronic problem since starting chemotherapy, but no current nausea or vomiting.  He describes a midsternal chest pressure sensation which is worse with cough episodes which have been nonproductive.  He is scheduled for his next chemotherapy treatment tomorrow morning.  He endorses bilateral lower extremity edema which is chronic but worsened today, stating he has swelling to his knees.  He has been taking his increased Lasix 40 mg twice daily dosing.  He has not been checking his weights.  Denies exposure to COVID-19 and has been fully vaccinated.   ED Course: BP stable, RR stable, O2 sat normal. Lab reveals Na 129, Hgb 9.4-stable since last hospitalization and due to  chemotherapy. BNP >4,500. U/A positive. CTA reveals posterior basal segmental PE RLL, Anterior LUL semgental PE and Basal segment LLL PE. Full dose heparin initiated in ED. TRH asked to admit for continue mgt of PE and acute on chronic diastolic heart failure.  Review of Systems: As per HPI otherwise 10 point review of systems negative.    Past Medical History:  Diagnosis Date  . Arterial embolus of lower extremity ([place]) 11/23/2013  . Arthritis   . CAD (coronary artery disease)    a. s/p PCI with 3.5 x 18 BMS to mid RCA, non obst LAD, Lcx patent 08/10/13  . Cancer ([place]) 2021   bladder  . Depression, major, single episode, mild ([place]) 12/20/2019  . ED (erectile dysfunction) 03/27/2015  . Embolus of femoral artery [facility]) 2012   Surgical repair, previously on Coumadin  . Essential hypertension   . ETOH abuse   . Hematuria 10/17/2019  . Hernia of abdominal cavity 02/27/2019  . History of DVT (deep vein thrombosis) 2011  . History of GI bleed    February 2012   . Ingrown left greater toenail 09/04/2018  . 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 Low oxygen saturation 01/31/2020  . 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 Pain with urination 10/17/2019  . Port-A-Cath in place 02/23/2020  . Prosthetic valve endocarditis ([place])   . PVD (peripheral vascular disease) ([place])   . S/P aortic valve replacement    Details not clear -  reportedly 1971 per patient  Past Surgical History:  Procedure Laterality Date  . ABDOMINAL AORTOGRAM W/LOWER EXTREMITY N/A 10/31/2018   Procedure: ABDOMINAL AORTOGRAM W/LOWER EXTREMITY;  Surgeon: Lorretta Harp, MD;  Location: [place] CV LAB;  Service: Cardiovascular;  Laterality: N/A;  . Aoric valve replacement  January 2005   4mm Medtronic Freestyle stentless porcine bioprosthesis [facility])  . CARDIAC CATHETERIZATION     30-40% mid LAD, 20-30% ostial D1, proximal 30-40% RCA, 95% mid RCA  thrombus, 40% distal stenosis. EF 45%, inferior HK.  [name] COLONOSCOPY N/A 04/20/2014   Procedure: COLONOSCOPY;  Surgeon: Rogene Houston, MD;  [facility];  Service: Endoscopy;  Laterality: N/A;  930  . COLONOSCOPY WITH PROPOFOL N/A 02/02/2020   Procedure: COLONOSCOPY WITH PROPOFOL;  Surgeon: Rogene Houston, MD;  [facility];  Service: Endoscopy;  Laterality: N/A;  . CORONARY ANGIOPLASTY WITH STENT PLACEMENT  07/2013  . ESOPHAGOGASTRODUODENOSCOPY (EGD) WITH PROPOFOL N/A 02/02/2020   Procedure: ESOPHAGOGASTRODUODENOSCOPY (EGD) WITH PROPOFOL;  Surgeon: Rogene Houston, MD;  [facility];  Service: Endoscopy;  Laterality: N/A;  . INGUINAL HERNIA REPAIR Bilateral 04/17/2019   Procedure: ATTEMPTED LAPAROSCOPIC BILATERAL INGUINAL HERNIA REPAIR;  Surgeon: Coralie Keens, MD;  Location: [place];  Service: General;  Laterality: Bilateral;  . INGUINAL HERNIA REPAIR Bilateral 04/17/2019   Procedure: Hernia Repair Inguinal Adult;  Surgeon: Coralie Keens, MD;  Location: [place];  Service: General;  Laterality: Bilateral;  . IR IMAGING GUIDED PORT INSERTION  02/22/2020  . IR NEPHROSTOMY EXCHANGE LEFT  04/05/2020  . IR NEPHROSTOMY EXCHANGE RIGHT  04/05/2020  . IR NEPHROSTOMY PLACEMENT LEFT  02/22/2020  . IR NEPHROSTOMY PLACEMENT RIGHT  02/22/2020  . LEFT HEART CATHETERIZATION WITH CORONARY ANGIOGRAM N/A 08/10/2013   Procedure: LEFT HEART CATHETERIZATION WITH CORONARY ANGIOGRAM;  Surgeon: Blane Ohara, MD;  Location: [facility] CATH LAB;  Service: Cardiovascular;  Laterality: N/A;  . Left leg embolectomy  May 2012   Dr. Trula Slade - reason for lifelong coumadin  . LYMPH NODE BIOPSY Left 11/21/2019   Procedure: Left Neck LYMPH NODE BIOPSY;  Surgeon: Rozetta Nunnery, MD;  Location: [place];  Service: ENT;  Laterality: Left;  . PERIPHERAL VASCULAR ATHERECTOMY  10/31/2018   Procedure: PERIPHERAL VASCULAR ATHERECTOMY;  Surgeon: Lorretta Harp, MD;  Location: [place] CV LAB;  Service:  Cardiovascular;;  left SFA  . POLYPECTOMY  02/02/2020   Procedure: POLYPECTOMY;  Surgeon: Rogene Houston, MD;  [facility];  Service: Endoscopy;;  splenic flexure   Soc Hx -  Married and divorced twice. Has two children. Has a current SO/fiance. 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 Lives alone and has been I-ADLs.    reports that he quit smoking about 50 years ago. His smoking use included cigarettes. He started smoking about 54 years ago. He has a 1.50 pack-year smoking history. He has never used smokeless tobacco. He reports previous alcohol use. He reports current drug use. Drug: Marijuana.  No Known Allergies  Family History  Problem Relation Age of Onset  . Diabetes Mother   . Cirrhosis Father   . COPD Sister      Prior to Admission medications   Medication Sig Start Date End Date Taking? Authorizing Provider  acetaminophen (TYLENOL) 325 MG tablet Take 650 mg by mouth in the morning and at bedtime.   Yes [provider]  amoxicillin-clavulanate (AUGMENTIN) 875-125 MG tablet Take 1 tablet by mouth every 12 (twelve) hours for 4 days.  04/17/20 04/21/20 Yes Shah, Pratik D, DO  aspirin EC 81 MG tablet Take 1 tablet (81 mg total) by mouth daily. 11/01/17  Yes [name], MD  atorvastatin (LIPITOR) 40 MG tablet Take 1 tablet (40 mg total) by mouth daily. 03/21/20  Yes Perlie Mayo, NP  cholecalciferol (VITAMIN D) 1000 units tablet Take 1 tablet (1,000 Units total) by mouth daily. 11/01/17  Yes [name], MD  clonazePAM (KLONOPIN) 0.5 MG tablet Take 1 tablet (0.5 mg total) by mouth at bedtime. 04/13/20  Yes Derek Jack, MD  clopidogrel (PLAVIX) 75 MG tablet Take 1 tablet by mouth once daily Patient taking differently: Take 75 mg by mouth daily.  03/21/20  Yes [name], MD  fluticasone (FLONASE) 50 MCG/ACT nasal spray Use 2 spray(s) in each nostril once daily Patient taking differently:  Place 2 sprays into both nostrils daily.  03/22/20  Yes Perlie Mayo, NP  furosemide (LASIX) 40 MG tablet Take 1 tablet (40 mg total) by mouth 2 (two) times daily. Patient taking differently: Take 20-40 mg by mouth 2 (two) times daily. Take 1 tablet(40mg ) in the morning and 1/2(20mg ) tablet in the evening. 04/15/20  Yes Johnson, Clanford L, MD  lisinopril (ZESTRIL) 5 MG tablet Take 1 tablet (5 mg total) by mouth daily. 04/16/20 06/15/20 Yes Johnson, Clanford L, MD  magnesium oxide (MAG-OX) 400 (241.3 Mg) MG tablet Take 1 tablet (400 mg total) by mouth 2 (two) times daily. 04/04/20  Yes Derek Jack, MD  metoprolol succinate (TOPROL-XL) 50 MG 24 hr tablet TAKE 1 TABLET BY MOUTH ONCE DAILY WITH&nbsp;&nbsp;OR&nbsp;&nbsp;IMMEDIATELY&nbsp;&nbsp;FOLLOWING&nbsp;&nbsp;A&nbsp;&nbsp;MEAL Patient taking differently: Take 50 mg by mouth daily. WITH  OR  IMMEDIATELY  FOLLOWING  A  MEAL 03/22/20  Yes Perlie Mayo, NP  montelukast (SINGULAIR) 10 MG tablet Take 1 tablet (10 mg total) by mouth at bedtime. 03/22/20  Yes Perlie Mayo, NP  nitroGLYCERIN (NITROSTAT) 0.4 MG SL tablet Place 1 tablet (0.4 mg total) under the tongue every 5 (five) minutes as needed for chest pain. 12/20/19  Yes Lorretta Harp, MD  potassium chloride (KLOR-CON) 10 MEQ tablet Take 1 tablet (10 mEq total) by mouth every other day. Patient taking differently: Take 10 mEq by mouth daily.  04/22/20 05/22/20 Yes Johnson, Clanford L, MD  prochlorperazine (COMPAZINE) 10 MG tablet Take 1 tablet (10 mg total) by mouth every 6 (six) hours as needed (Nausea or vomiting). 03/28/20  Yes Derek Jack, MD  trolamine salicylate (ASPERCREME) 10 % cream Apply 1 application topically daily as needed for muscle pain (for pain in feet). For pain in legs (swelling)   Yes [provider]  busPIRone (BUSPAR) 10 MG tablet Take 1 tablet (10 mg total) by mouth 2 (two) times daily. Patient not taking: Reported on 04/12/2020 12/20/19   Perlie Mayo, NP    escitalopram (LEXAPRO) 10 MG tablet Take 1 tablet (10 mg total) by mouth daily. Patient not taking: Reported on 04/12/2020 12/20/19   Perlie Mayo, NP  pantoprazole (PROTONIX) 40 MG tablet Take 1 tablet (40 mg total) by mouth daily before breakfast. Patient not taking: Reported on 04/12/2020 02/03/20   Damita Lack, MD    Physical Exam: Vitals:   04/21/20 1309 04/21/20 1441  BP:  (!) 142/96  Pulse:  67  Resp:  15  SpO2:  100%  Weight: 60.3 kg   Height: 5\' 4"  (1.626 m)     Constitutional: NAD, calm, comfortable Vitals:   04/21/20 1309 04/21/20 1441  BP:  (!) 142/96  Pulse:  67  Resp:  15  SpO2:  100%  Weight: 60.3 kg   Height: 5\' 4"  (1.626 m)    General: elderly man in no acute distress. Eyes: PERRL, lids normal and conjunctivae muddy ENMT: Mucous membranes are moist. Neck: normal, supple, no masses, no thyromegaly Respiratory: bibasilar crackles, no wheezing, normal I/E ratio. Normal respiratory effort. No accessory muscle use.  Cardiovascular: Regular rate and rhythm, no murmurs / rubs / gallops. 3+ pitting lower extremity edema. 1+ pedal pulses. No carotid bruits.  Abdomen: no tenderness, no masses palpated. No hepatosplenomegaly. Bowel sounds positive.  Musculoskeletal: no clubbing / cyanosis. No joint deformity upper and lower extremities. Good ROM, no contractures. Decreased muscle tone.  Skin: no rashes, lesions, ulcers. No induration Neurologic: CN 2-12 grossly intact. Strength 4/5 in all 4.  Psychiatric: Normal judgment and insight but poorly educated as to his condition. Alert and oriented x 3. Normal mood.     Labs on Admission: I have personally reviewed following labs and imaging studies  CBC: Recent Labs  Lab 04/15/20 0504 04/21/20 1412  WBC 5.7 6.2  NEUTROABS  --  4.0  HGB 8.6* 9.4*  HCT 27.1* 29.2*  MCV 79.9* 82.3  PLT 77* 299   Basic Metabolic Panel: Recent Labs  Lab 04/15/20 0504 04/16/20 0416 04/21/20 1412  NA 123* 123* 123*  K  4.0 5.0 5.0  CL 91* 91* 90*  CO2 22 22 23   GLUCOSE 96 95 104*  BUN 26* 33* 27*  CREATININE 1.06 1.15 0.95  CALCIUM 7.5* 7.9* 7.8*  MG 1.7 1.8  --    GFR: Estimated Creatinine Clearance: 62.3 mL/min (by C-G formula based on SCr of 0.95 mg/dL). Liver Function Tests: Recent Labs  Lab 04/15/20 0504 04/16/20 0416 04/21/20 1412  AST 39 32 31  ALT 116* 94* 51*  ALKPHOS 329* 353* 378*  BILITOT 0.7 0.9 0.5  PROT 5.6* 6.0* 6.1*  ALBUMIN 2.9* 3.0* 3.0*   No results for input(s): LIPASE, AMYLASE in the last 168 hours. No results for input(s): AMMONIA in the last 168 hours. Coagulation Profile: No results for input(s): INR, PROTIME in the last 168 hours. Cardiac Enzymes: No results for input(s): CKTOTAL, CKMB, CKMBINDEX, TROPONINI in the last 168 hours. BNP (last 3 results) No results for input(s): PROBNP in the last 8760 hours. HbA1C: No results for input(s): HGBA1C in the last 72 hours. CBG: No results for input(s): GLUCAP in the last 168 hours. Lipid Profile: No results for input(s): CHOL, HDL, LDLCALC, TRIG, CHOLHDL, LDLDIRECT in the last 72 hours. Thyroid Function Tests: No results for input(s): TSH, T4TOTAL, FREET4, T3FREE, THYROIDAB in the last 72 hours. Anemia Panel: No results for input(s): VITAMINB12, FOLATE, FERRITIN, TIBC, IRON, RETICCTPCT in the last 72 hours. Urine analysis:    Component Value Date/Time   COLORURINE YELLOW 04/21/2020 1556   APPEARANCEUR HAZY (A) 04/21/2020 1556   LABSPEC 1.013 04/21/2020 1556   PHURINE 6.0 04/21/2020 1556   GLUCOSEU NEGATIVE 04/21/2020 1556   GLUCOSEU 100 (A) 07/08/2010 1902   HGBUR MODERATE (A) 04/21/2020 1556   HGBUR small 07/10/2010 1004   BILIRUBINUR NEGATIVE 04/21/2020 1556   BILIRUBINUR neg 02/21/2020 0920   KETONESUR NEGATIVE 04/21/2020 1556   PROTEINUR 100 (A) 04/21/2020 1556   UROBILINOGEN negative (A) 02/21/2020 0920   UROBILINOGEN 0.2 02/05/2011 1016   NITRITE NEGATIVE 04/21/2020 1556   LEUKOCYTESUR LARGE (A)  04/21/2020 1556    Radiological Exams on Admission: CT Angio Chest PE W  and/or Wo Contrast  Result Date: 04/21/2020 CLINICAL DATA:  Increasing shortness of breath, history of bladder cancer, DVT EXAM: CT ANGIOGRAPHY CHEST WITH CONTRAST TECHNIQUE: Multidetector CT imaging of the chest was performed using the standard protocol during bolus administration of intravenous contrast. Multiplanar CT image reconstructions and MIPs were obtained to evaluate the vascular anatomy. CONTRAST:  9mL OMNIPAQUE IOHEXOL 350 MG/ML SOLN COMPARISON:  CT 02/02/2020 FINDINGS: Cardiovascular: Satisfactory opacification of the pulmonary arteries to the segmental level. Suspect filling defects within the segmental branches of the posterior basal segment right lower lobe (5/190) though difficult to fully assess given hypoventilatory changes/passive atelectasis in this region as well as some respiratory motion artifact. No other convincing pulmonary arterial filling defects additional peripherally marginated segmental filling defects seen in the anterior segmental branches left upper lobe (5/79) and lateral basal segmental branch of the left lower lobe (5/172). Central pulmonary arteries remain normal caliber. Diffuse cardiomegaly with biatrial enlargement and reflux of contrast into the IVC and hepatic veins. Transcatheter aortic valve replacement is in position. Coronary artery calcifications are present. Rib right IJ Port-A-Cath tip terminates at the level of the superior cavoatrial junction. No other major venous abnormalities. Mediastinum/Nodes: Postsurgical changes from prior sternotomy. No mediastinal fluid or gas. Normal thyroid gland and thoracic inlet. No acute abnormality of the trachea or esophagus. No worrisome mediastinal, hilar or axillary adenopathy. Lungs/Pleura: Moderate right pleural effusion with subtotal atelectatic collapse of the right lower lobe with further subsegmental atelectatic changes in the right upper and  middle lobes as well. Stable biapical pleuroparenchymal scarring and blebs with calcified right apical granuloma. Upper Abdomen: Coil pack seen in the central liver. Bilateral percutaneous nephrostomies. Small volume upper abdominal ascites. Musculoskeletal: Diffuse body wall edema. New extensive multifocal areas of sclerotic osseous metastatic disease throughout the axial skeleton, sternum and ribs as well as the bilateral scapula and right humerus. No acute fracture. Postsurgical changes from prior sternotomy with intact sternal sutures and bony fusion across the sternum. No worrisome chest wall lesions. Review of the MIP images confirms the above findings. IMPRESSION: 1. Segmental and subsegmental filling defects in the posterior basal segment right lower lobe, anterior segment left upper lobe and basal segmental left lower lobe. 2. Cardiomegaly with biatrial enlargement and reflux of contrast into the IVC and hepatic veins, suggestive of elevated right heart pressure/right heart failure. 3. Moderate right pleural effusion with subtotal atelectatic collapse of the right lower lobe with further subsegmental atelectatic changes in the right upper and middle lobes as well. Possibly related to volume status though malignant effusion is not excluded. 4. Additional features of anasarca with circumferential body wall edema and upper abdominal ascites. 5. Extensive new multifocal areas of sclerotic osseous metastatic disease. 6. Prior transcatheter aortic valve replacement as well as sternotomy. 7. Coronary artery atherosclerosis. 8. Bilateral percutaneous nephrostomies. 9. Aortic Atherosclerosis (ICD10-I70.0). Electronically Signed   By: Lovena Le M.D.   On: 04/21/2020 16:18   DG Chest Port 1 View  Result Date: 04/21/2020 CLINICAL DATA:  Increasing shortness of breath since last night. EXAM: PORTABLE CHEST 1 VIEW COMPARISON:  Radiograph 04/12/2020.  CT 02/02/2020 FINDINGS: Accessed right chest port in place. Post  median sternotomy. Stable cardiomegaly and prosthetic aortic valve/root. There is new volume loss at the right lung base with ill-defined opacity. Minimal fluid in the right minor fissure versus adjacent atelectasis. The left lung is clear. There is no pulmonary edema. No pneumothorax. Blunting of the right costophrenic angle may be chronic or small effusion. IMPRESSION: 1.  New volume loss at the right lung base with ill-defined opacity. This may represent atelectasis, pneumonia or potentially aspiration in the appropriate clinical setting. Possible small right pleural effusion. 2. Stable cardiomegaly. Electronically Signed   By: Keith Rake M.D.   On: 04/21/2020 15:03    EKG: Independently reviewed. NSR, LVH, LAFB, prolonged PR interval, no acute changes  Assessment/Plan Active Problems:   Essential hypertension, benign   Acute on chronic HFrEF (heart failure with reduced ejection fraction) -combined systolic and diastolic dysfunction CHF   Metastatic urothelial carcinoma (HCC)   UTI (urinary tract infection)   Acute on chronic diastolic (congestive) heart failure (HCC)  (please populate well all problems here in Problem List. (For example, if patient is on BP meds at home and you resume or decide to hold them, it is a problem that needs to be her. Same for CAD, COPD, HLD and so on)   1. Acute on chronic diastolic heart failure - recurrent hospitalization. Markedly elevated BNP. Last echo 01/05/20 with LVEF 71-21%, Grade I diastolic dysfunction. He is on ACE-I and B-blocker along with BID lasix. He reports he is adherent to regimen but has limited income which may be a factor. Plan Tele admit  Continue ACE-I and B-blocker.  Lasix 40 mg IV q8 x 3 doses  Daily weights and strict I&Os  Fluid restrict to 1.5 liters/day  Add Aldactone 12.5 mg daily  2. PE - multiple PE on CTA. Has h/o DVT and is at high risk 2/2 cancer diagnosis Plan Full dose heparin for 5 days then convert to NOAC  3. UTI  - never started Augment which was prescribed based on culture sensitivities Plan Start augmentin  4. Metatstatic urothelial carcinoma - new evidence of diffuse bony mets. He was scheduled for another round of chemo starting tomorrow. Plan Consult with oncology in AM  5. Code status - discussed with the patient. He wished to be a full code.  Plan Palliative care consult  DVT prophylaxis: full dose heparin  Code Status: full code  Family Communication: Patient does not wish to have anyone, fiance, etc, contacted tonight.  Disposition Plan: TBD  TOC in  Consults called: Please notify oncology -  Dr. Earlean Polka in AM Admission status: Inpatient/tele   Adella Hare MD [facility] Pager [phone]  If 7PM-7AM, please contact night-coverage [email or web address] Password [facility]  04/21/2020, 8:35 PM

## 2020-04-22 ENCOUNTER — Encounter (HOSPITAL_COMMUNITY): Payer: Self-pay | Admitting: Internal Medicine

## 2020-04-22 ENCOUNTER — Telehealth: Payer: Self-pay | Admitting: Family Medicine

## 2020-04-22 ENCOUNTER — Ambulatory Visit: Payer: Medicare Other | Admitting: Physician Assistant

## 2020-04-22 DIAGNOSIS — I5023 Acute on chronic systolic (congestive) heart failure: Secondary | ICD-10-CM

## 2020-04-22 DIAGNOSIS — I10 Essential (primary) hypertension: Secondary | ICD-10-CM

## 2020-04-22 DIAGNOSIS — I5033 Acute on chronic diastolic (congestive) heart failure: Secondary | ICD-10-CM

## 2020-04-22 DIAGNOSIS — I2699 Other pulmonary embolism without acute cor pulmonale: Secondary | ICD-10-CM

## 2020-04-22 LAB — CBC
HCT: 28.3 % — ABNORMAL LOW (ref 39.0–52.0)
Hemoglobin: 8.9 g/dL — ABNORMAL LOW (ref 13.0–17.0)
MCH: 25.8 pg — ABNORMAL LOW (ref 26.0–34.0)
MCHC: 31.4 g/dL (ref 30.0–36.0)
MCV: 82 fL (ref 80.0–100.0)
Platelets: 283 10*3/uL (ref 150–400)
RBC: 3.45 MIL/uL — ABNORMAL LOW (ref 4.22–5.81)
RDW: 27 % — ABNORMAL HIGH (ref 11.5–15.5)
WBC: 6.8 10*3/uL (ref 4.0–10.5)
nRBC: 0.4 % — ABNORMAL HIGH (ref 0.0–0.2)

## 2020-04-22 LAB — BASIC METABOLIC PANEL
Anion gap: 10 (ref 5–15)
BUN: 30 mg/dL — ABNORMAL HIGH (ref 8–23)
CO2: 25 mmol/L (ref 22–32)
Calcium: 7.9 mg/dL — ABNORMAL LOW (ref 8.9–10.3)
Chloride: 91 mmol/L — ABNORMAL LOW (ref 98–111)
Creatinine, Ser: 1.15 mg/dL (ref 0.61–1.24)
GFR calc Af Amer: >60 mL/min (ref >60)
GFR calc non Af Amer: >60 mL/min (ref >60)
Glucose, Bld: 126 mg/dL — ABNORMAL HIGH (ref 70–99)
Potassium: 4 mmol/L (ref 3.5–5.1)
Sodium: 126 mmol/L — ABNORMAL LOW (ref 135–145)

## 2020-04-22 LAB — HEPARIN LEVEL (UNFRACTIONATED)
Heparin Unfractionated: 0.43 [IU]/mL (ref 0.30–0.70)
Heparin Unfractionated: 0.47 IU/mL (ref 0.30–0.70)

## 2020-04-22 NOTE — Evaluation (Signed)
Physical Therapy Evaluation Patient Details Name: [name] MRN: [id number] DOB: 05-Jun-1951 Today's Date: 04/22/2020   History of Present Illness  [name] is a 69 y.o. male with medical history significant of CAD with PCI, history of femoral artery embolus, also history of DVT, with recent diagnosis of urothelial cancer with hydronephrosis 2/2 distal obstruction currently with bilateral nephrostomy tubes. He was discharged from the hospital 04/15/20 ago having been admitted for shortness of breath  secondary to CHF, returns today for worsening shortness of breath and UTI. He was to take Augmentin, based on culture ss but had not started.  He states he has had a slowly progressive worsening of symptoms, currently having difficulty speaking in full sentences, states his symptoms are worse at night and worse with exertion, better but not great when he is sitting in an upright position.  He denies fevers or chills.  He woke this morning with some dry heaves and endorses reduced appetite which has been a chronic problem since starting chemotherapy, but no current nausea or vomiting.  He describes a midsternal chest pressure sensation which is worse with cough episodes which have been nonproductive.  He is scheduled for his next chemotherapy treatment tomorrow morning.  He endorses bilateral lower extremity edema which is chronic but worsened today, stating he has swelling to his knees.  He has been taking his increased Lasix 40 mg twice daily dosing.  He has not been checking his weights.  Denies exposure to COVID-19 and has been fully vaccinated.     Clinical Impression  Patient demonstrates slow labored movement for sitting up at bedside and during ambulation in room having to lean over RW for support due to SOB, generalized weakness and c/o fatigue.  Patient at moderate risk for falls due to poor standing balance and put back to bed after therapy.  Patient will benefit from continued physical  therapy in hospital and recommended venue below to increase strength, balance, endurance for safe ADLs and gait.     Follow Up Recommendations SNF;Supervision - Intermittent;Supervision for mobility/OOB    Equipment Recommendations       Recommendations for Other Services       Precautions / Restrictions Precautions Precautions: Fall Restrictions Weight Bearing Restrictions: No      Mobility  Bed Mobility Overal bed mobility: Needs Assistance Bed Mobility: Supine to Sit;Sit to Supine     Supine to sit: Supervision Sit to supine: Supervision   General bed mobility comments: slow labored movemet  Transfers Overall transfer level: Needs assistance Equipment used: Rolling walker (2 wheeled) Transfers: Sit to/from [facility] Sit to Stand: Min guard;Min assist Stand pivot transfers: Min guard;Min assist       General transfer comment: increased time, labored movement  Ambulation/Gait Ambulation/Gait assistance: Min assist Gait Distance (Feet): 15 Feet Assistive device: Rolling walker (2 wheeled) Gait Pattern/deviations: Decreased step length - right;Decreased step length - left;Decreased stride length;Trunk flexed Gait velocity: decreased   General Gait Details: slow unsteady cadence with leaning over RW for support due to weakness, limited secondary to fatigue  Stairs            Wheelchair Mobility    Modified Rankin (Stroke Patients Only)       Balance Overall balance assessment: Needs assistance Sitting-balance support: No upper extremity supported;Feet unsupported Sitting balance-Leahy Scale: Good Sitting balance - Comments: seated at EOB   Standing balance support: Bilateral upper extremity supported;During functional activity Standing balance-Leahy Scale: Fair Standing balance comment: using RW  Pertinent Vitals/Pain Pain Assessment: No/denies pain    Home Living Family/patient  expects to be discharged to:: Private residence Living Arrangements: Alone Available Help at Discharge: Family;Available PRN/intermittently Type of Home: House Home Access: Stairs to enter Entrance Stairs-Rails: Right Entrance Stairs-Number of Steps: 4 Home Layout: One level Home Equipment: Walker - 2 wheels;Cane - single point      Prior Function Level of Independence: Independent with assistive device(s)         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        Extremity/Trunk Assessment   Upper Extremity Assessment Upper Extremity Assessment: Generalized weakness    Lower Extremity Assessment Lower Extremity Assessment: Generalized weakness    Cervical / Trunk Assessment Cervical / Trunk Assessment: Normal  Communication   Communication: No difficulties  Cognition Arousal/Alertness: Awake/alert Behavior During Therapy: WFL for tasks assessed/performed Overall Cognitive Status: Within Functional Limits for tasks assessed                                        General Comments      Exercises     Assessment/Plan    PT Assessment Patient needs continued PT services  PT Problem List Decreased strength;Decreased activity tolerance;Decreased balance;Decreased mobility       PT Treatment Interventions Gait training;Stair training;Functional mobility training;Therapeutic activities;Therapeutic exercise;Balance training;Patient/family education    PT Goals (Current goals can be found in the Care Plan section)  Acute Rehab PT Goals Patient Stated Goal: return home PT Goal Formulation: With patient Time For Goal Achievement: 05/06/20 Potential to Achieve Goals: Good    Frequency Min 3X/week   Barriers to discharge        Co-evaluation               AM-PAC PT "6 Clicks" Mobility  Outcome Measure Help needed turning from your back to your side while in a flat bed without using bedrails?:  None Help needed moving from lying on your back to sitting on the side of a flat bed without using bedrails?: A Little Help needed moving to and from a bed to a chair (including a wheelchair)?: A Little Help needed standing up from a chair using your arms (e.g., wheelchair or bedside chair)?: A Little Help needed to walk in hospital room?: A Lot Help needed climbing 3-5 steps with a railing? : A Lot 6 Click Score: 17    End of Session   Activity Tolerance: Patient tolerated treatment well;Patient limited by fatigue Patient left: in bed;with call bell/phone within reach Nurse Communication: Mobility status PT Visit Diagnosis: Unsteadiness on feet (R26.81);Other abnormalities of gait and mobility (R26.89);Muscle weakness (generalized) (M62.81)    Time: 0786-7544 PT Time Calculation (min) (ACUTE ONLY): 21 min   Charges:   PT Evaluation $PT Eval Moderate Complexity: 1 Mod PT Treatments $Therapeutic Activity: 23-37 mins $Neuromuscular Re-education: 8-22 mins        11:54 AM, 04/22/20 Lonell Grandchild, MPT Physical Therapist with [facility] [phone] office [phone] mobile phone

## 2020-04-22 NOTE — Consult Note (Signed)
Consultation Note Date: 04/25/2020  Patient Name: [name]  DOB: 1951/03/22  MRN: [id number]  Age / Sex: 69 y.o., male  PCP: [name], MD Referring Physician: Roxan Hockey, MD  Reason for Consultation: Establishing goals of care  HPI/Patient Profile: 69 y.o. male  with past medical history of CHF EF 45-50%, CAD s/p PCI, history of femoral artery embolus and DVT, recently diagnosed urothelial cancer with hydronephrosis secondary to distal obstruction with bilateral nephrostomy tubes and new evidence of diffuse bony mets (chemotherapy started 03/04/20), recent admission 7/24-7/26 for CHF exacerbation admitted on 04/21/2020 with progressive shortness of breath and dyspnea on exertion with CHF exacerbation and UTI. Found to have bilateral PE and declining EF 25-30% now with severe RV failure.   Clinical Assessment and Goals of Care: I met today with [name]. No family/visitors at bedside. He has good understanding of his medical conditions. He tells me when asked about his cancer that they told him "no good, nothing they can do." He knows that his condition is terminal. He has declining functional status with severe weakness and fatigue. He has bilateral lower extremity with swelling and neuropathic pain but this is relieved by oxycodone. Appetite is reportedly good. He lives alone and is planning to d/c for rehab trial. Next of kin is his sister who is his main support.   [name] shares with me that he has deep faith. He tells me that God took him off the streets in his 70s and he has been with him ever since. He tells me frankly that he is not afraid to die and when God is ready for him he is ready to go. In the meantime he appreciates everyone's efforts to help him the best they can. Upon further discussion he agrees with DNR and tells me there would be no sense in going through resuscitation  efforts. He has told his family that he is ready when his time comes and to just let him go. He would like to continue with treatment as able. He has no further complaints or needs. He is in a good place and has found acceptance and peace with his diagnosis and prognosis.   All questions/concerns addressed. Emotional support provided.   Primary Decision Maker PATIENT    SUMMARY OF RECOMMENDATIONS   - DNR decided - Hopeful for continue treatment with oncology  Code Status/Advance Care Planning:  DNR   Symptom Management:   No changes. Relief with current oxycodone.   Palliative Prophylaxis:   Frequent Pain Assessment  Prognosis:   Overall prognosis poor with incurable cancer. Would be eligible for hospice support at home if no plans for further treatment with oncology.   Discharge Planning: [place] for rehab with Palliative care service follow-up      Primary Diagnoses: Present on Admission: . Essential hypertension, benign . Acute on chronic HFrEF (heart failure with reduced ejection fraction) -combined systolic and diastolic dysfunction CHF . Metastatic urothelial carcinoma ([place]) . UTI (urinary tract infection) . Acute on chronic  diastolic (congestive) heart failure ([place])   I have reviewed the medical record, interviewed the patient and family, and examined the patient. The following aspects are pertinent.  Past Medical History:  Diagnosis Date  . Arterial embolus of lower extremity ([place]) 11/23/2013  . Arthritis   . CAD (coronary artery disease)    a. s/p PCI with 3.5 x 18 BMS to mid RCA, non obst LAD, Lcx patent 08/10/13  . Cancer ([place]) 2021   bladder  . Depression, major, single episode, mild ([place]) 12/20/2019  . ED (erectile dysfunction) 03/27/2015  . Embolus of femoral artery [facility]) 2012   Surgical repair, previously on Coumadin  . Essential hypertension   . ETOH abuse   . Hematuria 10/17/2019  . Hernia of abdominal cavity 02/27/2019  . History  of DVT (deep vein thrombosis) 2011  . History of GI bleed    February 2012   . Ingrown left greater toenail 09/04/2018  . 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 Low oxygen saturation 01/31/2020  . 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 Pain with urination 10/17/2019  . Port-A-Cath in place 02/23/2020  . Prosthetic valve endocarditis ([place])   . PVD (peripheral vascular disease) ([place])   . S/P aortic valve replacement    Details not clear -  reportedly 1971 per patient   Social History   Socioeconomic History  . Marital status: Single    Spouse name: Not on file  . Number of children: 2  . Years of education: Not on file  . Highest education level: 11th grade  Occupational History  . Occupation: disability  Tobacco Use  . Smoking status: Former Smoker    Packs/day: 0.50    Years: 3.00    Pack years: 1.50    Types: Cigarettes    Start date: 10/12/1965    Quit date: 02/19/1970    Years since quitting: 50.2  . Smokeless tobacco: Never Used  Vaping Use  . Vaping Use: Never used  Substance and Sexual Activity  . Alcohol use: Not Currently  . Drug use: Yes    Types: Marijuana    Comment: pt smokes weed once in the morning and once in the evening  . Sexual activity: Not Currently  Other Topics Concern  . Not on file  Social History Narrative  . Not on file   Social Determinants of Health   Financial Resource Strain:   . Difficulty of Paying Living Expenses:   Food Insecurity:   . Worried About Charity fundraiser in the Last Year:   . Arboriculturist in the Last Year:   Transportation Needs:   . Film/video editor (Medical):   [name] Lack of Transportation (Non-Medical):   Physical Activity:   . Days of Exercise per Week:   . Minutes of Exercise per Session:   Stress:   . Feeling of Stress :   Social Connections:   . Frequency of Communication with Friends and Family:   . Frequency of Social  Gatherings with Friends and Family:   . Attends Religious Services:   . Active Member of Clubs or Organizations:   . Attends Archivist Meetings:   [name] Marital Status:    Family History  Problem Relation Age of Onset  . Diabetes Mother   . Cirrhosis Father   . COPD Sister    Scheduled Meds: . amoxicillin-clavulanate  1  tablet Oral Q12H  . atorvastatin  40 mg Oral q1800  . clonazePAM  0.5 mg Oral QHS  . docusate sodium  100 mg Oral BID  . fluticasone  2 spray Each Nare Daily  . furosemide  40 mg Intravenous Q6H  . lisinopril  5 mg Oral Daily  . magnesium oxide  400 mg Oral BID  . metoprolol succinate  50 mg Oral Daily  . montelukast  10 mg Oral QHS  . pantoprazole  40 mg Oral QAC breakfast  . potassium chloride  10 mEq Oral Daily  . sodium chloride flush  3 mL Intravenous Q12H  . sodium chloride flush  3 mL Intravenous Q12H  . spironolactone  12.5 mg Oral Daily   Continuous Infusions: . sodium chloride    . sodium chloride    . heparin 1,100 Units/hr (04/21/20 1753)   PRN Meds:.sodium chloride, sodium chloride, acetaminophen, nitroGLYCERIN, ondansetron (ZOFRAN) IV, prochlorperazine, sodium chloride flush, sodium chloride flush No Known Allergies Review of Systems  Constitutional: Positive for activity change and fatigue. Negative for appetite change.  Respiratory: Negative for shortness of breath.   Cardiovascular: Positive for leg swelling.  Neurological: Positive for weakness.    Physical Exam Vitals and nursing note reviewed.  Constitutional:      General: He is not in acute distress.    Appearance: He is ill-appearing.  Cardiovascular:     Rate and Rhythm: Normal rate.  Pulmonary:     Effort: Pulmonary effort is normal. No tachypnea, accessory muscle usage or respiratory distress.  Abdominal:     General: Abdomen is flat.     Palpations: Abdomen is soft.  Musculoskeletal:     Right lower leg: 2+ Pitting Edema present.     Left lower leg: 2+ Pitting  Edema present.  Neurological:     Mental Status: He is alert and oriented to person, place, and time.     Vital Signs: BP (!) 140/124   Pulse 77   Resp 15   Ht _0  (1.626 m)   Wt 60.3 kg   SpO2 95%   BMI 22.82 kg/m  Pain Scale: 0-10   Pain Score: 8    SpO2: SpO2: 95 % O2 Device:SpO2: 95 % O2 Flow Rate: .O2 Flow Rate (L/min): 0 L/min  IO: Intake/output summary:   Intake/Output Summary (Last 24 hours) at 04/22/2020 1021 Last data filed at 04/22/2020 0600 Gross per 24 hour  Intake --  Output 4000 ml  Net -4000 ml    LBM:   Baseline Weight: Weight: 60.3 kg Most recent weight: Weight: 60.3 kg     Palliative Assessment/Data:     Time In: 0915 Time Out: 1005 Time Total:  50 min Greater than 50%  of this time was spent counseling and coordinating care related to the above assessment and plan.  Signed by: Vinie Sill, NP Palliative Medicine Team Pager # [phone] (M-F 8a-5p) Team Phone # [phone] (Nights/Weekends)

## 2020-04-22 NOTE — Telephone Encounter (Signed)
Alley from [facility] home health PT evaluation done and will see pt 1x a week for 4 weeks and reacess after

## 2020-04-22 NOTE — Progress Notes (Signed)
ANTICOAGULATION CONSULT NOTE   Pharmacy Consult for Heparin Indication: pulmonary embolus  No Known Allergies  Patient Measurements: Height: 5\' 4"  (162.6 cm) Weight: 60.3 kg (132 lb 15 oz) IBW/kg (Calculated) : 59.2 HEPARIN DW (KG): 60.3  Vital Signs: BP: 121/73 (08/02 0000) Pulse Rate: 75 (08/02 0000)  Labs: Recent Labs    04/21/20 1412 04/21/20 1630 04/22/20 0120  HGB 9.4*  --   --   HCT 29.2*  --   --   PLT 299  --   --   HEPARINUNFRC  --   --  0.43  CREATININE 0.95  --   --   TROPONINIHS 32* 32*  --     Estimated Creatinine Clearance: 62.3 mL/min (by C-G formula based on SCr of 0.95 mg/dL).   Medical History: Past Medical History:  Diagnosis Date  . Arterial embolus of lower extremity ([place]) 11/23/2013  . Arthritis   . CAD (coronary artery disease)    a. s/p PCI with 3.5 x 18 BMS to mid RCA, non obst LAD, Lcx patent 08/10/13  . Cancer ([place]) 2021   bladder  . Depression, major, single episode, mild ([place]) 12/20/2019  . ED (erectile dysfunction) 03/27/2015  . Embolus of femoral artery [facility]) 2012   Surgical repair, previously on Coumadin  . Essential hypertension   . ETOH abuse   . Hematuria 10/17/2019  . Hernia of abdominal cavity 02/27/2019  . History of DVT (deep vein thrombosis) 2011  . History of GI bleed    February 2012   . Ingrown left greater toenail 09/04/2018  . 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 Low oxygen saturation 01/31/2020  . 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 Pain with urination 10/17/2019  . Port-A-Cath in place 02/23/2020  . Prosthetic valve endocarditis ([place])   . PVD (peripheral vascular disease) ([place])   . S/P aortic valve replacement    Details not clear -  reportedly 1971 per patient    Medications:  See med rec  Assessment: 69 y.o. male with a history of CAD with PCI, history of femoral artery embolus, also history of DVT, with recent diagnosis of  kidney cancer currently with bilateral nephrostomy tubes, was discharged from the hospital 6 days ago. He returns today for worsening shortness of breath.  He states he has had a slowly progressive worsening of symptoms. ED MD is concerned with PE, pharmacy asked to start heparin. Reviewed home medications and is not currently on any oral anticoagulants.  8/2 AM update:  Initial heparin level therapeutic   Goal of Therapy:  Heparin level 0.3-0.7 units/ml Monitor platelets by anticoagulation protocol: Yes   Plan:  Cont heparin at 1100 units/hr Confirmatory heparin level with AM labs  Narda Bonds, PharmD, [place] Pharmacist Phone: [phone]

## 2020-04-22 NOTE — Progress Notes (Addendum)
ANTICOAGULATION CONSULT NOTE -  Pharmacy Consult for Heparin Indication: pulmonary embolus  No Known Allergies  Patient Measurements: Height: 5\' 4"  (162.6 cm) Weight: 60.3 kg (132 lb 15 oz) IBW/kg (Calculated) : 59.2 HEPARIN DW (KG): 60.3  Vital Signs: BP: 109/75 (08/02 0600) Pulse Rate: 77 (08/02 0600)  Labs: Recent Labs    04/21/20 1412 04/21/20 1630 04/22/20 0120 04/22/20 0429  HGB 9.4*  --   --  8.9*  HCT 29.2*  --   --  28.3*  PLT 299  --   --  283  HEPARINUNFRC  --   --  0.43 0.47  CREATININE 0.95  --   --  1.15  TROPONINIHS 32* 32*  --   --     Estimated Creatinine Clearance: 51.5 mL/min (by C-G formula based on SCr of 1.15 mg/dL).   Medical History: Past Medical History:  Diagnosis Date  . Arterial embolus of lower extremity ([place]) 11/23/2013  . Arthritis   . CAD (coronary artery disease)    a. s/p PCI with 3.5 x 18 BMS to mid RCA, non obst LAD, Lcx patent 08/10/13  . Cancer ([place]) 2021   bladder  . Depression, major, single episode, mild ([place]) 12/20/2019  . ED (erectile dysfunction) 03/27/2015  . Embolus of femoral artery [facility]) 2012   Surgical repair, previously on Coumadin  . Essential hypertension   . ETOH abuse   . Hematuria 10/17/2019  . Hernia of abdominal cavity 02/27/2019  . History of DVT (deep vein thrombosis) 2011  . History of GI bleed    February 2012   . Ingrown left greater toenail 09/04/2018  . 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 Low oxygen saturation 01/31/2020  . 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 Pain with urination 10/17/2019  . Port-A-Cath in place 02/23/2020  . Prosthetic valve endocarditis ([place])   . PVD (peripheral vascular disease) ([place])   . S/P aortic valve replacement    Details not clear -  reportedly 1971 per patient    Medications:  See med rec  Assessment: 69 y.o. male with a history of CAD with PCI, history of femoral artery embolus,  also history of DVT, with recent diagnosis of kidney cancer currently with bilateral nephrostomy tubes, was discharged from the hospital 6 days ago. He returns today for worsening shortness of breath.  He states he has had a slowly progressive worsening of symptoms. ED MD is concerned with PE, pharmacy asked to start heparin. Reviewed home medications and is not currently on any oral anticoagulants.  HL remains therapeutic at 0.47  Goal of Therapy:  Heparin level 0.3-0.7 units/ml Monitor platelets by anticoagulation protocol: Yes   Plan:  Continue heparin infusion at 1100 units/hr Check anti-Xa level  daily while on heparin Continue to monitor H&H and platelets  Isac Sarna, BS [place], BCPS Clinical Pharmacist Pager [phone] 04/22/2020,8:26 AM

## 2020-04-22 NOTE — ED Notes (Signed)
ED TO INPATIENT HANDOFF REPORT  ED Nurse Name and Phone #: [phone]  S Name/Age/Gender [name] 69 y.o. male Room/Bed: APA02/APA02  Code Status   Code Status: Full Code  Home/SNF/Other Home Patient oriented to: self, place, time and situation Is this baseline? Yes   Triage Complete: Triage complete  Chief Complaint Acute on chronic diastolic (congestive) heart failure ([place]) [I50.33]  Triage Note Patient presents to the ED via [facility] due to increased shortness of breath since last night.  EMS reports patient saturation at 98% room air, but became short of breath when ambulating up to stretcher.  EMS reports normal vital signs, CBG of 128.  Patient denies covid exposure and has had the covid vaccines.      Allergies No Known Allergies  Level of Care/Admitting Diagnosis ED Disposition    ED Disposition Condition Comment   Admit  Hospital Area: [facility] [[id number]]  Level of Care: Telemetry [5]  Covid Evaluation: Confirmed COVID Negative  Diagnosis: Acute on chronic diastolic (congestive) heart failure [facility]) [[id number]]  Admitting Physician: Neena Rhymes [[id number]]  Attending Physician: Adella Hare E [[id number]]  Estimated length of stay: 3 - 4 days  Certification:: I certify this patient will need inpatient services for at least 2 midnights       B Medical/Surgery History Past Medical History:  Diagnosis Date  . Arterial embolus of lower extremity ([place]) 11/23/2013  . Arthritis   . CAD (coronary artery disease)    a. s/p PCI with 3.5 x 18 BMS to mid RCA, non obst LAD, Lcx patent 08/10/13  . Cancer ([place]) 2021   bladder  . Depression, major, single episode, mild ([place]) 12/20/2019  . ED (erectile dysfunction) 03/27/2015  . Embolus of femoral artery [facility]) 2012   Surgical repair, previously on Coumadin  . Essential hypertension   . ETOH abuse   . Hematuria 10/17/2019  . Hernia of abdominal cavity 02/27/2019  . History of DVT (deep vein thrombosis)  2011  . History of GI bleed    February 2012   . Ingrown left greater toenail 09/04/2018  . 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 Low oxygen saturation 01/31/2020  . 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 Pain with urination 10/17/2019  . Port-A-Cath in place 02/23/2020  . Prosthetic valve endocarditis ([place])   . PVD (peripheral vascular disease) ([place])   . S/P aortic valve replacement    Details not clear -  reportedly 1971 per patient   Past Surgical History:  Procedure Laterality Date  . ABDOMINAL AORTOGRAM W/LOWER EXTREMITY N/A 10/31/2018   Procedure: ABDOMINAL AORTOGRAM W/LOWER EXTREMITY;  Surgeon: Lorretta Harp, MD;  Location: [place] CV LAB;  Service: Cardiovascular;  Laterality: N/A;  . Aoric valve replacement  January 2005   80mm Medtronic Freestyle stentless porcine bioprosthesis [facility])  . CARDIAC CATHETERIZATION     30-40% mid LAD, 20-30% ostial D1, proximal 30-40% RCA, 95% mid RCA thrombus, 40% distal stenosis. EF 45%, inferior HK.  [name] COLONOSCOPY N/A 04/20/2014   Procedure: COLONOSCOPY;  Surgeon: Rogene Houston, MD;  [facility];  Service: Endoscopy;  Laterality: N/A;  930  . COLONOSCOPY WITH PROPOFOL N/A 02/02/2020   Procedure: COLONOSCOPY WITH PROPOFOL;  Surgeon: Rogene Houston, MD;  Location: [facility];  Service: Endoscopy;  Laterality: N/A;  . CORONARY ANGIOPLASTY WITH STENT PLACEMENT  07/2013  .  ESOPHAGOGASTRODUODENOSCOPY (EGD) WITH PROPOFOL N/A 02/02/2020   Procedure: ESOPHAGOGASTRODUODENOSCOPY (EGD) WITH PROPOFOL;  Surgeon: Rogene Houston, MD;  [facility];  Service: Endoscopy;  Laterality: N/A;  . INGUINAL HERNIA REPAIR Bilateral 04/17/2019   Procedure: ATTEMPTED LAPAROSCOPIC BILATERAL INGUINAL HERNIA REPAIR;  Surgeon: Coralie Keens, MD;  Location: [place];  Service: General;  Laterality: Bilateral;  . INGUINAL HERNIA REPAIR Bilateral 04/17/2019    Procedure: Hernia Repair Inguinal Adult;  Surgeon: Coralie Keens, MD;  Location: [place];  Service: General;  Laterality: Bilateral;  . IR IMAGING GUIDED PORT INSERTION  02/22/2020  . IR NEPHROSTOMY EXCHANGE LEFT  04/05/2020  . IR NEPHROSTOMY EXCHANGE RIGHT  04/05/2020  . IR NEPHROSTOMY PLACEMENT LEFT  02/22/2020  . IR NEPHROSTOMY PLACEMENT RIGHT  02/22/2020  . LEFT HEART CATHETERIZATION WITH CORONARY ANGIOGRAM N/A 08/10/2013   Procedure: LEFT HEART CATHETERIZATION WITH CORONARY ANGIOGRAM;  Surgeon: Blane Ohara, MD;  Location: [facility] CATH LAB;  Service: Cardiovascular;  Laterality: N/A;  . Left leg embolectomy  May 2012   Dr. Trula Slade - reason for lifelong coumadin  . LYMPH NODE BIOPSY Left 11/21/2019   Procedure: Left Neck LYMPH NODE BIOPSY;  Surgeon: Rozetta Nunnery, MD;  Location: [place];  Service: ENT;  Laterality: Left;  . PERIPHERAL VASCULAR ATHERECTOMY  10/31/2018   Procedure: PERIPHERAL VASCULAR ATHERECTOMY;  Surgeon: Lorretta Harp, MD;  Location: [place] CV LAB;  Service: Cardiovascular;;  left SFA  . POLYPECTOMY  02/02/2020   Procedure: POLYPECTOMY;  Surgeon: Rogene Houston, MD;  [facility];  Service: Endoscopy;;  splenic flexure     A IV Location/Drains/Wounds Patient Lines/Drains/Airways Status    Active Line/Drains/Airways    Name Placement date Placement time Site Days   Implanted Port 04/21/20 Right Chest 04/21/20  1432  Chest  1   Nephrostomy Right 10 Fr. 04/05/20  1415  Right  17   Nephrostomy Left 10 Fr. 04/05/20  1415  Left  17   Incision (Closed) 04/17/19 Abdomen 04/17/19  1016   371   Incision (Closed) 04/17/19 Groin Anterior;Right 04/17/19  1105   371   Incision (Closed) 04/17/19 Groin Anterior;Left 04/17/19  1105   371   Incision (Closed) 11/21/19 Neck Left 11/21/19  1112   153          Intake/Output Last 24 hours  Intake/Output Summary (Last 24 hours) at 04/22/2020 1311 Last data filed at 04/22/2020 1126 Gross per 24 hour  Intake 600 ml   Output 4000 ml  Net -3400 ml    Labs/Imaging Results for orders placed or performed during the hospital encounter of 04/21/20 (from the past 48 hour(s))  Comprehensive metabolic panel     Status: Abnormal   Collection Time: 04/21/20  2:12 PM  Result Value Ref Range   Sodium 123 (L) 135 - 145 mmol/L   Potassium 5.0 3.5 - 5.1 mmol/L   Chloride 90 (L) 98 - 111 mmol/L   CO2 23 22 - 32 mmol/L   Glucose, Bld 104 (H) 70 - 99 mg/dL    Comment: Glucose reference range applies only to samples taken after fasting for at least 8 hours.   BUN 27 (H) 8 - 23 mg/dL   Creatinine, Ser 0.95 0.61 - 1.24 mg/dL   Calcium 7.8 (L) 8.9 - 10.3 mg/dL   Total Protein 6.1 (L) 6.5 - 8.1 g/dL   Albumin 3.0 (L) 3.5 - 5.0 g/dL   AST 31 15 - 41 U/L   ALT 51 (H)  0 - 44 U/L   Alkaline Phosphatase 378 (H) 38 - 126 U/L   Total Bilirubin 0.5 0.3 - 1.2 mg/dL   GFR calc non Af Amer >60 >60 mL/min   GFR calc Af Amer >60 >60 mL/min   Anion gap 10 5 - 15    Comment: Performed at [facility], [address], [place], [place] [address]  CBC WITH DIFFERENTIAL     Status: Abnormal   Collection Time: 04/21/20  2:12 PM  Result Value Ref Range   WBC 6.2 4.0 - 10.5 K/uL   RBC 3.55 (L) 4.22 - 5.81 MIL/uL   Hemoglobin 9.4 (L) 13.0 - 17.0 g/dL   HCT 29.2 (L) 39 - 52 %   MCV 82.3 80.0 - 100.0 fL   MCH 26.5 26.0 - 34.0 pg   MCHC 32.2 30.0 - 36.0 g/dL   RDW 27.2 (H) 11.5 - 15.5 %   Platelets 299 150 - 400 K/uL   nRBC 0.0 0.0 - 0.2 %   Neutrophils Relative % 64 %   Neutro Abs 4.0 1.7 - 7.7 K/uL   Lymphocytes Relative 16 %   Lymphs Abs 1.0 0.7 - 4.0 K/uL   Monocytes Relative 16 %   Monocytes Absolute 1.0 0 - 1 K/uL   Eosinophils Relative 2 %   Eosinophils Absolute 0.1 0 - 0 K/uL   Basophils Relative 1 %   Basophils Absolute 0.0 0 - 0 K/uL   Immature Granulocytes 1 %   Abs Immature Granulocytes 0.08 (H) 0.00 - 0.07 K/uL   Schistocytes PRESENT    Burr Cells PRESENT    Polychromasia PRESENT     Comment: Performed at  [facility], [address], [place], [place] [address]  Brain natriuretic peptide     Status: Abnormal   Collection Time: 04/21/20  2:12 PM  Result Value Ref Range   B Natriuretic Peptide >4,500.0 (H) 0.0 - 100.0 pg/mL    Comment: Performed at [facility], [address], [place], [place] [address]  Troponin I (High Sensitivity)     Status: Abnormal   Collection Time: 04/21/20  2:12 PM  Result Value Ref Range   Troponin I (High Sensitivity) 32 (H) <18 ng/L    Comment: (NOTE) Elevated high sensitivity troponin I (hsTnI) values and significant  changes across serial measurements may suggest ACS but many other  chronic and acute conditions are known to elevate hsTnI results.  Refer to the "Links" section for chest pain algorithms and additional  guidance. Performed at [facility], [address], [place], [place] [address]   Urinalysis, Routine w reflex microscopic     Status: Abnormal   Collection Time: 04/21/20  3:56 PM  Result Value Ref Range   Color, Urine YELLOW YELLOW   APPearance HAZY (A) CLEAR   Specific Gravity, Urine 1.013 1.005 - 1.030   pH 6.0 5.0 - 8.0   Glucose, UA NEGATIVE NEGATIVE mg/dL   Hgb urine dipstick MODERATE (A) NEGATIVE   Bilirubin Urine NEGATIVE NEGATIVE   Ketones, ur NEGATIVE NEGATIVE mg/dL   Protein, ur 100 (A) NEGATIVE mg/dL   Nitrite NEGATIVE NEGATIVE   Leukocytes,Ua LARGE (A) NEGATIVE   RBC / HPF 6-10 0 - 5 RBC/hpf   WBC, UA 21-50 0 - 5 WBC/hpf   Bacteria, UA MANY (A) NONE SEEN   Mucus PRESENT    Hyaline Casts, UA PRESENT     Comment: Performed at [facility], [address], [place],  [address]  Troponin I (High  Sensitivity)     Status: Abnormal   Collection Time: 04/21/20  4:30 PM  Result Value Ref Range   Troponin I (High Sensitivity) 32 (H) <18 ng/L    Comment: (NOTE) Elevated high sensitivity troponin I (hsTnI) values and significant  changes across serial measurements may suggest ACS but many other  chronic and acute conditions  are known to elevate hsTnI results.  Refer to the "Links" section for chest pain algorithms and additional  guidance. Performed at [facility], [address], [place], [place] [address]   SARS Coronavirus 2 by RT PCR (hospital order, performed in [facility] hospital lab) Nasopharyngeal Nasopharyngeal Swab     Status: None   Collection Time: 04/21/20  8:41 PM   Specimen: Nasopharyngeal Swab  Result Value Ref Range   SARS Coronavirus 2 NEGATIVE NEGATIVE    Comment: (NOTE) SARS-CoV-2 target nucleic acids are NOT DETECTED.  The SARS-CoV-2 RNA is generally detectable in upper and lower respiratory specimens during the acute phase of infection. The lowest concentration of SARS-CoV-2 viral copies this assay can detect is 250 copies / mL. A negative result does not preclude SARS-CoV-2 infection and should not be used as the sole basis for treatment or other patient management decisions.  A negative result may occur with improper specimen collection / handling, submission of specimen other than nasopharyngeal swab, presence of viral mutation(s) within the areas targeted by this assay, and inadequate number of viral copies (<250 copies / mL). A negative result must be combined with clinical observations, patient history, and epidemiological information.  Fact Sheet for Patients:   StrictlyIdeas.no  Fact Sheet for Healthcare Providers: BankingDealers.co.za  This test is not yet approved or  cleared by the [place] FDA and has been authorized for detection and/or diagnosis of SARS-CoV-2 by FDA under an Emergency Use Authorization (EUA).  This EUA will remain in effect (meaning this test can be used) for the duration of the COVID-19 declaration under Section 564(b)(1) of the Act, 21 U.S.C. section 360bbb-3(b)(1), unless the authorization is terminated or revoked sooner.  Performed at [facility], [address] [place], [place]  [address]   Heparin level (unfractionated)     Status: None   Collection Time: 04/22/20  1:20 AM  Result Value Ref Range   Heparin Unfractionated 0.43 0.30 - 0.70 IU/mL    Comment: (NOTE) If heparin results are below expected values, and patient dosage has  been confirmed, suggest follow up testing of antithrombin III levels. Performed at [facility], [address] [place], [place] [address]   CBC     Status: Abnormal   Collection Time: 04/22/20  4:29 AM  Result Value Ref Range   WBC 6.8 4.0 - 10.5 K/uL   RBC 3.45 (L) 4.22 - 5.81 MIL/uL   Hemoglobin 8.9 (L) 13.0 - 17.0 g/dL   HCT 28.3 (L) 39 - 52 %   MCV 82.0 80.0 - 100.0 fL   MCH 25.8 (L) 26.0 - 34.0 pg   MCHC 31.4 30.0 - 36.0 g/dL   RDW 27.0 (H) 11.5 - 15.5 %   Platelets 283 150 - 400 K/uL   nRBC 0.4 (H) 0.0 - 0.2 %    Comment: Performed at [facility], [address] [place],  [address]  Basic metabolic panel     Status: Abnormal   Collection Time: 04/22/20  4:29 AM  Result Value Ref Range   Sodium 126 (L) 135 - 145 mmol/L   Potassium 4.0 3.5 - 5.1 mmol/L  Comment: DELTA CHECK NOTED   Chloride 91 (L) 98 - 111 mmol/L   CO2 25 22 - 32 mmol/L   Glucose, Bld 126 (H) 70 - 99 mg/dL    Comment: Glucose reference range applies only to samples taken after fasting for at least 8 hours.   BUN 30 (H) 8 - 23 mg/dL   Creatinine, Ser 1.15 0.61 - 1.24 mg/dL   Calcium 7.9 (L) 8.9 - 10.3 mg/dL   GFR calc non Af Amer >60 >60 mL/min   GFR calc Af Amer >60 >60 mL/min   Anion gap 10 5 - 15    Comment: Performed at [facility], [address] [place], [place] [address]  Heparin level (unfractionated)     Status: None   Collection Time: 04/22/20  4:29 AM  Result Value Ref Range   Heparin Unfractionated 0.47 0.30 - 0.70 IU/mL    Comment: (NOTE) If heparin results are below expected values, and patient dosage has  been confirmed, suggest follow up testing of antithrombin III levels. Performed at [facility], [address],  [place], [place] [address]    CT Angio Chest PE W and/or Wo Contrast  Result Date: 04/21/2020 CLINICAL DATA:  Increasing shortness of breath, history of bladder cancer, DVT EXAM: CT ANGIOGRAPHY CHEST WITH CONTRAST TECHNIQUE: Multidetector CT imaging of the chest was performed using the standard protocol during bolus administration of intravenous contrast. Multiplanar CT image reconstructions and MIPs were obtained to evaluate the vascular anatomy. CONTRAST:  78mL OMNIPAQUE IOHEXOL 350 MG/ML SOLN COMPARISON:  CT 02/02/2020 FINDINGS: Cardiovascular: Satisfactory opacification of the pulmonary arteries to the segmental level. Suspect filling defects within the segmental branches of the posterior basal segment right lower lobe (5/190) though difficult to fully assess given hypoventilatory changes/passive atelectasis in this region as well as some respiratory motion artifact. No other convincing pulmonary arterial filling defects additional peripherally marginated segmental filling defects seen in the anterior segmental branches left upper lobe (5/79) and lateral basal segmental branch of the left lower lobe (5/172). Central pulmonary arteries remain normal caliber. Diffuse cardiomegaly with biatrial enlargement and reflux of contrast into the IVC and hepatic veins. Transcatheter aortic valve replacement is in position. Coronary artery calcifications are present. Rib right IJ Port-A-Cath tip terminates at the level of the superior cavoatrial junction. No other major venous abnormalities. Mediastinum/Nodes: Postsurgical changes from prior sternotomy. No mediastinal fluid or gas. Normal thyroid gland and thoracic inlet. No acute abnormality of the trachea or esophagus. No worrisome mediastinal, hilar or axillary adenopathy. Lungs/Pleura: Moderate right pleural effusion with subtotal atelectatic collapse of the right lower lobe with further subsegmental atelectatic changes in the right upper and middle lobes as well. Stable  biapical pleuroparenchymal scarring and blebs with calcified right apical granuloma. Upper Abdomen: Coil pack seen in the central liver. Bilateral percutaneous nephrostomies. Small volume upper abdominal ascites. Musculoskeletal: Diffuse body wall edema. New extensive multifocal areas of sclerotic osseous metastatic disease throughout the axial skeleton, sternum and ribs as well as the bilateral scapula and right humerus. No acute fracture. Postsurgical changes from prior sternotomy with intact sternal sutures and bony fusion across the sternum. No worrisome chest wall lesions. Review of the MIP images confirms the above findings. IMPRESSION: 1. Segmental and subsegmental filling defects in the posterior basal segment right lower lobe, anterior segment left upper lobe and basal segmental left lower lobe. 2. Cardiomegaly with biatrial enlargement and reflux of contrast into the IVC and hepatic veins, suggestive of elevated right heart pressure/right heart failure.  3. Moderate right pleural effusion with subtotal atelectatic collapse of the right lower lobe with further subsegmental atelectatic changes in the right upper and middle lobes as well. Possibly related to volume status though malignant effusion is not excluded. 4. Additional features of anasarca with circumferential body wall edema and upper abdominal ascites. 5. Extensive new multifocal areas of sclerotic osseous metastatic disease. 6. Prior transcatheter aortic valve replacement as well as sternotomy. 7. Coronary artery atherosclerosis. 8. Bilateral percutaneous nephrostomies. 9. Aortic Atherosclerosis (ICD10-I70.0). Electronically Signed   By: Lovena Le M.D.   On: 04/21/2020 16:18   DG Chest Port 1 View  Result Date: 04/21/2020 CLINICAL DATA:  Increasing shortness of breath since last night. EXAM: PORTABLE CHEST 1 VIEW COMPARISON:  Radiograph 04/12/2020.  CT 02/02/2020 FINDINGS: Accessed right chest port in place. Post median sternotomy. Stable  cardiomegaly and prosthetic aortic valve/root. There is new volume loss at the right lung base with ill-defined opacity. Minimal fluid in the right minor fissure versus adjacent atelectasis. The left lung is clear. There is no pulmonary edema. No pneumothorax. Blunting of the right costophrenic angle may be chronic or small effusion. IMPRESSION: 1. New volume loss at the right lung base with ill-defined opacity. This may represent atelectasis, pneumonia or potentially aspiration in the appropriate clinical setting. Possible small right pleural effusion. 2. Stable cardiomegaly. Electronically Signed   By: Keith Rake M.D.   On: 04/21/2020 15:03    Pending Labs Unresulted Labs (From admission, onward) Comment          Start     Ordered   04/23/20 0500  Heparin level (unfractionated)  Daily,   R      04/21/20 1746   04/22/20 0500  CBC  Daily,   R      04/21/20 1746   04/22/20 1443  Basic metabolic panel  Daily,   R      04/21/20 2033          Vitals/Pain Today's Vitals   04/22/20 0900 04/22/20 0930 04/22/20 0955 04/22/20 1000  BP: (!) 147/83 (!) 124/61  (!) 123/61  Pulse:   80   Resp: 16 20 19 17   SpO2:   100%   Weight:      Height:      PainSc:        Isolation Precautions No active isolations  Medications Medications  heparin bolus via infusion 3,600 Units (3,600 Units Intravenous Bolus from Bag 04/21/20 1753)    Followed by  heparin ADULT infusion 100 units/mL (25000 units/256mL sodium chloride 0.45%) (1,100 Units/hr Intravenous New Bag/Given 04/22/20 1126)  pantoprazole (PROTONIX) EC tablet 40 mg (40 mg Oral Given 04/22/20 0801)  acetaminophen (TYLENOL) tablet 650 mg (has no administration in time range)  amoxicillin-clavulanate (AUGMENTIN) 875-125 MG per tablet 1 tablet (1 tablet Oral Given 04/22/20 1021)  atorvastatin (LIPITOR) tablet 40 mg (40 mg Oral Given 04/21/20 2212)  lisinopril (ZESTRIL) tablet 5 mg (5 mg Oral Given 04/22/20 1021)  metoprolol succinate (TOPROL-XL) 24 hr  tablet 50 mg (50 mg Oral Given 04/22/20 1021)  nitroGLYCERIN (NITROSTAT) SL tablet 0.4 mg (has no administration in time range)  prochlorperazine (COMPAZINE) tablet 10 mg (has no administration in time range)  clonazePAM (KLONOPIN) tablet 0.5 mg (0.5 mg Oral Given 04/21/20 2213)  magnesium oxide (MAG-OX) tablet 400 mg (400 mg Oral Given 04/22/20 1021)  potassium chloride SA (KLOR-CON) CR tablet 10 mEq (10 mEq Oral Given 04/22/20 1021)  fluticasone (FLONASE) 50 MCG/ACT nasal spray 2 spray (2 sprays  Each Nare Not Given 04/22/20 1021)  montelukast (SINGULAIR) tablet 10 mg (10 mg Oral Given 04/21/20 2210)  sodium chloride flush (NS) 0.9 % injection 3 mL (3 mLs Intravenous Not Given 04/22/20 1022)  sodium chloride flush (NS) 0.9 % injection 3 mL (has no administration in time range)  0.9 %  sodium chloride infusion (has no administration in time range)  ondansetron (ZOFRAN) injection 4 mg (has no administration in time range)  furosemide (LASIX) injection 40 mg (40 mg Intravenous Given 04/22/20 1023)  sodium chloride flush (NS) 0.9 % injection 3 mL (3 mLs Intravenous Not Given 04/22/20 1022)  sodium chloride flush (NS) 0.9 % injection 3 mL (has no administration in time range)  0.9 %  sodium chloride infusion (has no administration in time range)  docusate sodium (COLACE) capsule 100 mg (100 mg Oral Given 04/22/20 1021)  spironolactone (ALDACTONE) tablet 12.5 mg (12.5 mg Oral Given 04/22/20 1021)  iohexol (OMNIPAQUE) 350 MG/ML injection 75 mL (75 mLs Intravenous Contrast Given 04/21/20 1549)  furosemide (LASIX) injection 80 mg (80 mg Intravenous Given 04/21/20 1635)    Mobility walks Moderate fall risk   Focused Assessments    R Recommendations: See Admitting Provider Note  Report given to:   Additional Notes:

## 2020-04-22 NOTE — NC FL2 (Signed)
[place] MEDICAID FL2 LEVEL OF CARE SCREENING TOOL     IDENTIFICATION  Patient Name: [name] Birthdate: 04/29/1951 Sex: male Admission Date (Current Location): 04/21/2020  [facility] and [place] Number:  [employer] and Address:  [place] [address], [place]      Provider Number:    Attending Physician Name and Address:  Roxan Hockey, MD  Relative Name and Phone Number:  Alejandro Mulling      NKNLZ:[phone]    Current Level of Care: SNF Recommended Level of Care: [place] Prior Approval Number: [id number]  Date Approved/Denied:   PASRR Number:    Discharge Plan: SNF    Current Diagnoses: Patient Active Problem List   Diagnosis Date Noted  . Acute on chronic diastolic (congestive) heart failure ([place]) 04/21/2020  . Hyponatremia   . Transaminitis   . CHF exacerbation ([place]) 04/12/2020  . Hydronephrosis 04/01/2020  . Malignant neoplasm of overlapping sites of bladder ([place]) 03/29/2020  . Iron deficiency anemia 03/27/2020  . Nausea with vomiting 03/27/2020  . Encounter for support and coordination of transition of care 03/19/2020  . UTI (urinary tract infection) 03/06/2020  . Port-A-Cath in place 02/23/2020  . Hydrocele 02/21/2020  . Metastatic urothelial carcinoma ([place]) 02/13/2020  . Goals of care, counseling/discussion 02/13/2020  . Chronic anemia 01/31/2020  . Acute on chronic HFrEF (heart failure with reduced ejection fraction) -combined systolic and diastolic dysfunction CHF 99/24/2683  . Nervously anxious 12/20/2019  . Depression, major, single episode, mild ([place]) 12/20/2019  . Weight loss, non-intentional 10/17/2019  . Annual visit for general adult medical examination with abnormal findings 09/12/2019  . Supraclavicular lymphadenopathy 09/12/2019  . Grade 3 out of 6 intensity murmur 09/12/2019  . Bilateral recurrent inguinal hernias 03/08/2019  . Claudication in peripheral vascular disease  ([place]) 10/31/2018  . Peripheral arterial disease ([place]) 10/26/2018  . Shortness of breath on exertion 10/16/2017  . Severe aortic insufficiency 10/02/2017  . Allergic rhinitis 12/30/2015  . Hyperlipidemia LDL goal <70 08/25/2013  . Aortic atherosclerosis ([place]) 08/12/2013  . Coronary atherosclerosis of native coronary artery 08/10/2013  . Hepatic artery aneurysm ([place]) 12/10/2010  . S/P aortic valve replacement   . Essential hypertension, benign 05/13/2010    Orientation RESPIRATION BLADDER Height & Weight     Self, Time, Situation, Place  Normal Continent (Nephrostomy Left Catheter insert) Weight: 132 lb 15 oz (60.3 kg) Height:  5\' 4"  (162.6 cm)  BEHAVIORAL SYMPTOMS/MOOD NEUROLOGICAL BOWEL NUTRITION STATUS      Continent Diet (Diet Heart Room service appropriate? Yes; Fluid consistency: Thin)  AMBULATORY STATUS COMMUNICATION OF NEEDS Skin   Extensive Assist Verbally Normal                       Personal Care Assistance Level of Assistance  Bathing, Feeding, Dressing Bathing Assistance: Limited assistance Feeding assistance: Limited assistance Dressing Assistance: Maximum assistance     Functional Limitations Info  Sight, Hearing, Speech Sight Info: Adequate Hearing Info: Adequate Speech Info: Adequate    SPECIAL CARE FACTORS FREQUENCY  PT (By licensed PT)     PT Frequency: 5x per week              Contractures Contractures Info: Not present    Additional Factors Info  Code Status, Allergies Code Status Info: Full Allergies Info: no known           Current Medications (04/22/2020):  This is the current hospital active medication list  Current Facility-Administered Medications  Medication Dose Route Frequency Provider Last Rate Last Admin  . 0.9 %  sodium chloride infusion  250 mL Intravenous PRN Norins, Heinz Knuckles, MD      . 0.9 %  sodium chloride infusion  250 mL Intravenous PRN Norins, Heinz Knuckles, MD      . acetaminophen (TYLENOL) tablet 650 mg  650 mg  Oral Q6H PRN Norins, Heinz Knuckles, MD      . amoxicillin-clavulanate (AUGMENTIN) 875-125 MG per tablet 1 tablet  1 tablet Oral Q12H Norins, Heinz Knuckles, MD   1 tablet at 04/22/20 1021  . atorvastatin (LIPITOR) tablet 40 mg  40 mg Oral q1800 Neena Rhymes, MD   40 mg at 04/21/20 2212  . clonazePAM (KLONOPIN) tablet 0.5 mg  0.5 mg Oral QHS Norins, Heinz Knuckles, MD   0.5 mg at 04/21/20 2213  . docusate sodium (COLACE) capsule 100 mg  100 mg Oral BID Neena Rhymes, MD   100 mg at 04/22/20 1021  . fluticasone (FLONASE) 50 MCG/ACT nasal spray 2 spray  2 spray Each Nare Daily Norins, Heinz Knuckles, MD      . furosemide (LASIX) injection 40 mg  40 mg Intravenous Q6H Norins, Heinz Knuckles, MD   40 mg at 04/22/20 1023  . heparin ADULT infusion 100 units/mL (25000 units/239mL sodium chloride 0.45%)  1,100 Units/hr Intravenous Continuous Lajean Saver, MD 11 mL/hr at 04/22/20 1126 1,100 Units/hr at 04/22/20 1126  . lisinopril (ZESTRIL) tablet 5 mg  5 mg Oral Daily Norins, Heinz Knuckles, MD   5 mg at 04/22/20 1021  . magnesium oxide (MAG-OX) tablet 400 mg  400 mg Oral BID Neena Rhymes, MD   400 mg at 04/22/20 1021  . metoprolol succinate (TOPROL-XL) 24 hr tablet 50 mg  50 mg Oral Daily Norins, Heinz Knuckles, MD   50 mg at 04/22/20 1021  . montelukast (SINGULAIR) tablet 10 mg  10 mg Oral QHS Norins, Heinz Knuckles, MD   10 mg at 04/21/20 2210  . nitroGLYCERIN (NITROSTAT) SL tablet 0.4 mg  0.4 mg Sublingual Q5 min PRN Norins, Heinz Knuckles, MD      . ondansetron [facility]) injection 4 mg  4 mg Intravenous Q6H PRN Norins, Heinz Knuckles, MD      . pantoprazole (PROTONIX) EC tablet 40 mg  40 mg Oral QAC breakfast Norins, Heinz Knuckles, MD   40 mg at 04/22/20 0801  . potassium chloride SA (KLOR-CON) CR tablet 10 mEq  10 mEq Oral Daily Norins, Heinz Knuckles, MD   10 mEq at 04/22/20 1021  . prochlorperazine (COMPAZINE) tablet 10 mg  10 mg Oral Q6H PRN Norins, Heinz Knuckles, MD      . sodium chloride flush (NS) 0.9 % injection 3 mL  3 mL Intravenous Q12H  Norins, Heinz Knuckles, MD      . sodium chloride flush (NS) 0.9 % injection 3 mL  3 mL Intravenous PRN Norins, Heinz Knuckles, MD      . sodium chloride flush (NS) 0.9 % injection 3 mL  3 mL Intravenous Q12H Norins, Heinz Knuckles, MD      . sodium chloride flush (NS) 0.9 % injection 3 mL  3 mL Intravenous PRN Norins, Heinz Knuckles, MD      . spironolactone (ALDACTONE) tablet 12.5 mg  12.5 mg Oral Daily Norins, Heinz Knuckles, MD   12.5 mg at 04/22/20 1021   Current Outpatient Medications  Medication Sig Dispense Refill  . acetaminophen (TYLENOL) 325 MG tablet Take  650 mg by mouth in the morning and at bedtime.    [name] aspirin EC 81 MG tablet Take 1 tablet (81 mg total) by mouth daily. 90 tablet 3  . atorvastatin (LIPITOR) 40 MG tablet Take 1 tablet (40 mg total) by mouth daily. 90 tablet 1  . cholecalciferol (VITAMIN D) 1000 units tablet Take 1 tablet (1,000 Units total) by mouth daily. 90 tablet 3  . clonazePAM (KLONOPIN) 0.5 MG tablet Take 1 tablet (0.5 mg total) by mouth at bedtime. 30 tablet 0  . clopidogrel (PLAVIX) 75 MG tablet Take 1 tablet by mouth once daily (Patient taking differently: Take 75 mg by mouth daily. ) 90 tablet 0  . fluticasone (FLONASE) 50 MCG/ACT nasal spray Use 2 spray(s) in each nostril once daily (Patient taking differently: Place 2 sprays into both nostrils daily. ) 16 g 0  . furosemide (LASIX) 40 MG tablet Take 1 tablet (40 mg total) by mouth 2 (two) times daily. (Patient taking differently: Take 20-40 mg by mouth 2 (two) times daily. Take 1 tablet(40mg ) in the morning and 1/2(20mg ) tablet in the evening.) 90 tablet 0  . lisinopril (ZESTRIL) 5 MG tablet Take 1 tablet (5 mg total) by mouth daily. 30 tablet 1  . magnesium oxide (MAG-OX) 400 (241.3 Mg) MG tablet Take 1 tablet (400 mg total) by mouth 2 (two) times daily. 60 tablet 2  . metoprolol succinate (TOPROL-XL) 50 MG 24 hr tablet TAKE 1 TABLET BY MOUTH ONCE DAILY WITH  OR  IMMEDIATELY  FOLLOWING  A  MEAL (Patient taking differently: Take 50  mg by mouth daily. WITH  OR  IMMEDIATELY  FOLLOWING  A  MEAL) 90 tablet 0  . montelukast (SINGULAIR) 10 MG tablet Take 1 tablet (10 mg total) by mouth at bedtime. 90 tablet 0  . nitroGLYCERIN (NITROSTAT) 0.4 MG SL tablet Place 1 tablet (0.4 mg total) under the tongue every 5 (five) minutes as needed for chest pain. 25 tablet 0  . potassium chloride (KLOR-CON) 10 MEQ tablet Take 1 tablet (10 mEq total) by mouth every other day. (Patient taking differently: Take 10 mEq by mouth daily. ) 15 tablet 0  . prochlorperazine (COMPAZINE) 10 MG tablet Take 1 tablet (10 mg total) by mouth every 6 (six) hours as needed (Nausea or vomiting). 60 tablet 3  . trolamine salicylate (ASPERCREME) 10 % cream Apply 1 application topically daily as needed for muscle pain (for pain in feet). For pain in legs (swelling)    . busPIRone (BUSPAR) 10 MG tablet Take 1 tablet (10 mg total) by mouth 2 (two) times daily. (Patient not taking: Reported on 04/12/2020) 60 tablet 2  . escitalopram (LEXAPRO) 10 MG tablet Take 1 tablet (10 mg total) by mouth daily. (Patient not taking: Reported on 04/12/2020) 30 tablet 1  . pantoprazole (PROTONIX) 40 MG tablet Take 1 tablet (40 mg total) by mouth daily before breakfast. (Patient not taking: Reported on 04/12/2020) 30 tablet 2     Discharge Medications: Please see discharge summary for a list of discharge medications.  Relevant Imaging Results:  Relevant Lab Results:   Additional Information Nephrostomy Left Catheter insert  Sharna Gabrys Ophelia Shoulder, LCSW

## 2020-04-22 NOTE — TOC Initial Note (Signed)
Transition of Care [facility]) - Initial/Assessment Note   Patient Details  Name: [name] MRN: [id number] Date of Birth: 08/21/1951  Transition of Care [facility]) CM/SW Contact:    Sherie Don, LCSW Phone Number: 04/22/2020, 9:56 AM  Clinical Narrative: Patient is a 69 year old male who was admitted for hypertension, acute on chronic HFrEF (heart failure with reduced ejection fraction), metastatic urothelial carcinoma, UTI, and acute on chronic diastolic (congestive) heart failure. Re-admission prevention screening completed due to high re-admission score. TOC received consult for CHF screening. CSW met with patient in ED to complete assessment. Per patient, he continues to live alone and his siblings and brother-in-law assist him as needed. Patient reported he has a cane, walker, and 3-in-1 at home. Patient is currently active with [facility] for [facility] (RN, PT). Patient reported he is able to afford his medications monthly and takes them as prescribed. Patient reported he is less independent with his ADLs stating, "I need a lot of assistance."  CSW completed CHF screeing. Patient reported he follows a heart healthy diet and restrict his salt intake. Patient stated he is "trying to do the best I can with it" in regards to restricting his fluid intake. Patient reported he does not take daily weights, but does have a scale at home. Patient provided permission for CSW to call PCP regarding his next scheduled appointment.  CSW called Dr. Griffin Dakin office and spoke with [place]. Per Gwinda Passe, patient's annual physical is scheduled for 05/17/20. CSW scheduled folllow-up appointment with PCP for 04/29/20 at 2:20pm. Appointment added to discharge summary. TOC to follow.     Expected Discharge Plan: [place] Barriers to Discharge: Continued Medical Work up  Patient Goals and CMS Choice Patient states their goals for this hospitalization and ongoing recovery are:: Discharge home CMS Medicare.gov Compare  Post Acute Care list provided to:: Patient Choice offered to / list presented to : Patient  Expected Discharge Plan and Services Expected Discharge Plan: [place] In-house Referral: Clinical Social Work Discharge Planning Services: NA Post Acute Care Choice: [place] arrangements for the past 2 months: [place] Arranged: RN, PT [place] Agency: [place] Date [place]: 04/22/20 Time [place]: [phone] Representative spoke with at [place]: Georgina Snell  Prior Living Arrangements/Services Living arrangements for the past 2 months: [place] with:: Self Patient language and need for interpreter reviewed:: Yes Do you feel safe going back to the place where you live?: Yes      Need for Family Participation in Patient Care: Yes (Comment) Care giver support system in place?: Yes (comment) Current home services: DME, Home PT, Home RN (Cane, 3-in-1, walker) Criminal Activity/Legal Involvement Pertinent to Current Situation/Hospitalization: No - Comment as needed  Activities of Daily Living Home Assistive Devices/Equipment: Cane (specify quad or straight), Walker (specify type) ADL Screening (condition at time of admission) Patient's cognitive ability adequate to safely complete daily activities?: Yes Is the patient deaf or have difficulty hearing?: No Does the patient have difficulty seeing, even when wearing glasses/contacts?: No Does the patient have difficulty concentrating, remembering, or making decisions?: No Patient able to express need for assistance with ADLs?: Yes Does the patient have difficulty dressing or bathing?: Yes Independently performs ADLs?: No Communication: Independent Dressing (OT): Needs assistance Is this a change from baseline?: Pre-admission baseline Grooming: Needs assistance Is this a change from baseline?: Pre-admission baseline Feeding: Independent Bathing: Needs assistance Is  this  a change from baseline?: Pre-admission baseline Toileting: Needs assistance Is this a change from baseline?: Pre-admission baseline In/Out Bed: Needs assistance Is this a change from baseline?: Pre-admission baseline Walks in Home: Independent Does the patient have difficulty walking or climbing stairs?: Yes Weakness of Legs: Both Weakness of Arms/Hands: Both  Permission Sought/Granted Permission sought to share information with : Chartered certified accountant granted to share info w AGENCY: [facility] Lemmings; PCP's office  Emotional Assessment Appearance:: Appears stated age Attitude/Demeanor/Rapport: Engaged Affect (typically observed): Accepting Orientation: : Oriented to Self, Oriented to Place, Oriented to  Time, Oriented to Situation Alcohol / Substance Use: Not Applicable Psych Involvement: No (comment)  Admission diagnosis:  Acute on chronic diastolic (congestive) heart failure (HCC) [I50.33] Patient Active Problem List   Diagnosis Date Noted  . Acute on chronic diastolic (congestive) heart failure ([place]) 04/21/2020  . Hyponatremia   . Transaminitis   . CHF exacerbation ([place]) 04/12/2020  . Hydronephrosis 04/01/2020  . Malignant neoplasm of overlapping sites of bladder ([place]) 03/29/2020  . Iron deficiency anemia 03/27/2020  . Nausea with vomiting 03/27/2020  . Encounter for support and coordination of transition of care 03/19/2020  . UTI (urinary tract infection) 03/06/2020  . Port-A-Cath in place 02/23/2020  . Hydrocele 02/21/2020  . Metastatic urothelial carcinoma ([place]) 02/13/2020  . Goals of care, counseling/discussion 02/13/2020  . Chronic anemia 01/31/2020  . Acute on chronic HFrEF (heart failure with reduced ejection fraction) -combined systolic and diastolic dysfunction CHF 11/91/4782  . Nervously anxious 12/20/2019  . Depression, major, single episode, mild ([place]) 12/20/2019  . Weight loss, non-intentional 10/17/2019  . Annual visit for general adult  medical examination with abnormal findings 09/12/2019  . Supraclavicular lymphadenopathy 09/12/2019  . Grade 3 out of 6 intensity murmur 09/12/2019  . Bilateral recurrent inguinal hernias 03/08/2019  . Claudication in peripheral vascular disease ([place]) 10/31/2018  . Peripheral arterial disease ([place]) 10/26/2018  . Shortness of breath on exertion 10/16/2017  . Severe aortic insufficiency 10/02/2017  . Allergic rhinitis 12/30/2015  . Hyperlipidemia LDL goal <70 08/25/2013  . Aortic atherosclerosis ([place]) 08/12/2013  . Coronary atherosclerosis of native coronary artery 08/10/2013  . Hepatic artery aneurysm ([place]) 12/10/2010  . S/P aortic valve replacement   . Essential hypertension, benign 05/13/2010   PCP:  [name], MD Pharmacy:   [facility] [address] [place] [address] [place] [address] Phone: [phone] Fax: [phone]  Readmission Risk Interventions Readmission Risk Prevention Plan 04/22/2020 03/07/2020  Transportation Screening Complete Complete  PCP or Specialist Appt within 3-5 Days - Not Complete  Not Complete comments - Assessment completed during second shift  [place] or [place] - Complete  Social Work Consult for [place] Planning/Counseling - Complete  Palliative Care Screening - Not Applicable  Medication Review Press photographer) Complete Complete  PCP or Specialist appointment within 3-5 days of discharge Complete -  [place] or Home Care Consult Complete -  SW Recovery Care/Counseling Consult Complete -  Palliative Care Screening Complete -  [place] Not Applicable -  Some recent data might be hidden

## 2020-04-22 NOTE — Telephone Encounter (Signed)
FYI

## 2020-04-22 NOTE — ED Notes (Signed)
PT at bedside.

## 2020-04-22 NOTE — Plan of Care (Signed)
  Problem: Acute Rehab PT Goals(only PT should resolve) Goal: Pt Will Go Supine/Side To Sit Outcome: Progressing Flowsheets (Taken 04/22/2020 1156) Pt will go Supine/Side to Sit:  Independently  with modified independence Goal: Patient Will Transfer Sit To/From Stand Outcome: Progressing Flowsheets (Taken 04/22/2020 1156) Patient will transfer sit to/from stand:  Independently  with modified independence Goal: Pt Will Transfer Bed To Chair/Chair To Bed Outcome: Progressing Flowsheets (Taken 04/22/2020 1156) Pt will Transfer Bed to Chair/Chair to Bed:  with modified independence  with supervision Goal: Pt Will Ambulate Outcome: Progressing Flowsheets (Taken 04/22/2020 1156) Pt will Ambulate:  50 feet  with modified independence  with supervision  with rolling walker  with cane   11:56 AM, 04/22/20 Lonell Grandchild, MPT Physical Therapist with [facility] [phone] office [phone] mobile phone

## 2020-04-22 NOTE — Progress Notes (Signed)
Patient Demographics:    [name], is a 69 y.o. male, DOB - 09/29/1950, IRC:[id number]  Admit date - 04/21/2020   Admitting Physician Neena Rhymes, MD  Outpatient Primary MD for the patient is [name], MD  LOS - 1   Chief Complaint  Patient presents with  . Shortness of Breath        Subjective:    [name] today has no fevers, no emesis,   -Chest pain and shortness of breath persist -Requiring oxygen  Assessment  & Plan :    Principal Problem:   Pulmonary embolism (HCC) Active Problems:   Acute on chronic HFrEF (heart failure with reduced ejection fraction) -combined systolic and diastolic dysfunction CHF   Essential hypertension, benign   Metastatic urothelial carcinoma (HCC)   UTI (urinary tract infection)   Acute on chronic diastolic (congestive) heart failure (HCC)   Brief Summary: 69 y.o.malewith a history of CAD with PCI, history of femoral artery embolus, also history of DVT, with recent diagnosis of kidney cancer currently with bilateral nephrostomy tubes admitted on 04/22/2019 for bilateral PE after being recently discharged on 04/17/2020   A/p 1)Bilateral PE in the setting of underlying malignancy--- patient with prior history of DVT, - IV heparin for now will most likely need lifelong anticoagulation -Await echocardiogram to evaluate for right heart strain May need prolonged IV heparin prior to transitioning to NOAC  2)HFrEF--patient with acute on chronic combined diastolic and systolic dysfunction CHF--- last known EF 45 to 50% based on echo from 01/05/2020 - 3)-- Metatstatic Urothelial Carcinoma - new evidence of diffuse bony mets-----urothelial cancer with hydronephrosis 2/2 distal obstruction currently with bilateral nephrostomy tubes--- patient will need ongoing follow-up with IR and urology, as well as oncology, he was scheduled to get chemotherapy  on 04/23/2020  4)Social/Ethics--patient is a full code, palliative care consult requested  5)Recent urine culture with Acinetobacter and Enterococcus--- patient was treated in-house and then discharged on Augmentin which is being continued at this time we will get repeat UA  6) acute respiratory failure with hypoxia--- secondary to #1 above should improve with treatment of #1 above  Disposition/Need for in-Hospital Stay- patient unable to be discharged at this time due to --- bilateral PE requiring IV heparin  Status is: Inpatient  Remains inpatient appropriate because:- bilateral PE requiring IV heparin   Disposition: The patient is from: Home              Anticipated d/c is to: SNF              Anticipated d/c date is: 2 days              Patient currently is not medically stable to d/c. Barriers: Not Clinically Stable- - bilateral PE requiring IV heparin   Code Status : full  Family Communication:    (patient is alert, awake and coherent)   Consults  :  palliative  DVT Prophylaxis  : IV heparin  Lab Results  Component Value Date   PLT 283 04/22/2020    Inpatient Medications  Scheduled Meds: . amoxicillin-clavulanate  1 tablet Oral Q12H  . atorvastatin  40 mg Oral q1800  . clonazePAM  0.5 mg Oral QHS  . docusate sodium  100 mg Oral BID  . fluticasone  2 spray Each Nare Daily  . lisinopril  5 mg Oral Daily  . magnesium oxide  400 mg Oral BID  . metoprolol succinate  50 mg Oral Daily  . montelukast  10 mg Oral QHS  . pantoprazole  40 mg Oral QAC breakfast  . potassium chloride  10 mEq Oral Daily  . sodium chloride flush  3 mL Intravenous Q12H  . sodium chloride flush  3 mL Intravenous Q12H  . spironolactone  12.5 mg Oral Daily   Continuous Infusions: . sodium chloride    . sodium chloride    . heparin 1,100 Units/hr (04/22/20 1126)   PRN Meds:.sodium chloride, sodium chloride, acetaminophen, nitroGLYCERIN, ondansetron (ZOFRAN) IV, prochlorperazine, sodium  chloride flush, sodium chloride flush    Anti-infectives (From admission, onward)   Start     Dose/Rate Route Frequency Ordered Stop   04/21/20 2200  amoxicillin-clavulanate (AUGMENTIN) 875-125 MG per tablet 1 tablet     Discontinue     1 tablet Oral Every 12 hours 04/21/20 2026          Objective:   Vitals:   04/22/20 1400 04/22/20 1430 04/22/20 1503 04/22/20 1756  BP: (!) 111/55 119/69 109/70 125/73  Pulse:   68 73  Resp: 16 14 20 20   Temp:   98 F (36.7 C) 98.6 F (37 C)  TempSrc:   Oral Oral  SpO2:   100% 100%  Weight:   62.2 kg   Height:   5\' 4"  (1.626 m)     Wt Readings from Last 3 Encounters:  04/22/20 62.2 kg  04/17/20 60.3 kg  04/04/20 54.8 kg     Intake/Output Summary (Last 24 hours) at 04/22/2020 2025 Last data filed at 04/22/2020 2000 Gross per 24 hour  Intake 867.15 ml  Output 3150 ml  Net -2282.85 ml     Physical Exam  Gen:- Awake Alert, some conversational dyspnea HEENT:- [place].AT, No sclera icterus Nose- [place] 3 L/min Neck-Supple Neck,No JVD,.  Lungs-diminished in bases with scattered rales CV- S1, S2 normal, regular  Abd-  +ve B.Sounds, Abd Soft, No tenderness, bilateral nephrostomy tubes Extremity/Skin:- No  edema, pedal pulses present  Psych-affect is appropriate, oriented x3 Neuro-generalized weakness, no new focal deficits, no tremors   Data Review:   Micro Results Recent Results (from the past 240 hour(s))  Urine Culture     Status: Abnormal   Collection Time: 04/12/20 10:25 PM   Specimen: Urine, Clean Catch  Result Value Ref Range Status   Specimen Description   Final    URINE, CLEAN CATCH Performed at [facility], [address], [place], [place] [address]    Special Requests   Final    NONE Performed at [facility], [address], [place],  [address]    Culture (A)  Final    >=100,000 COLONIES/mL ACINETOBACTER SPECIES >=100,000 COLONIES/mL ENTEROCOCCUS FAECALIS    Report Status 04/15/2020 FINAL  Final   Organism ID,  Bacteria ACINETOBACTER SPECIES (A)  Final   Organism ID, Bacteria ENTEROCOCCUS FAECALIS (A)  Final      Susceptibility   Acinetobacter species - MIC*    CEFTAZIDIME 4 SENSITIVE Sensitive     CIPROFLOXACIN <=0.25 SENSITIVE Sensitive     GENTAMICIN <=1 SENSITIVE Sensitive     IMIPENEM <=0.25 SENSITIVE Sensitive     PIP/TAZO <=4 SENSITIVE Sensitive     TRIMETH/SULFA <=20 SENSITIVE Sensitive     AMPICILLIN/SULBACTAM <=2 SENSITIVE Sensitive     * >=  100,000 COLONIES/mL ACINETOBACTER SPECIES   Enterococcus faecalis - MIC*    AMPICILLIN <=2 SENSITIVE Sensitive     NITROFURANTOIN <=16 SENSITIVE Sensitive     VANCOMYCIN 2 SENSITIVE Sensitive     * >=100,000 COLONIES/mL ENTEROCOCCUS FAECALIS  SARS Coronavirus 2 by RT PCR (hospital order, performed in [place] hospital lab) Nasopharyngeal Nasopharyngeal Swab     Status: None   Collection Time: 04/12/20 10:48 PM   Specimen: Nasopharyngeal Swab  Result Value Ref Range Status   SARS Coronavirus 2 NEGATIVE NEGATIVE Final    Comment: (NOTE) SARS-CoV-2 target nucleic acids are NOT DETECTED.  The SARS-CoV-2 RNA is generally detectable in upper and lower respiratory specimens during the acute phase of infection. The lowest concentration of SARS-CoV-2 viral copies this assay can detect is 250 copies / mL. A negative result does not preclude SARS-CoV-2 infection and should not be used as the sole basis for treatment or other patient management decisions.  A negative result may occur with improper specimen collection / handling, submission of specimen other than nasopharyngeal swab, presence of viral mutation(s) within the areas targeted by this assay, and inadequate number of viral copies (<250 copies / mL). A negative result must be combined with clinical observations, patient history, and epidemiological information.  Fact Sheet for Patients:   StrictlyIdeas.no  Fact Sheet for Healthcare  Providers: BankingDealers.co.za  This test is not yet approved or  cleared by the [place] FDA and has been authorized for detection and/or diagnosis of SARS-CoV-2 by FDA under an Emergency Use Authorization (EUA).  This EUA will remain in effect (meaning this test can be used) for the duration of the COVID-19 declaration under Section 564(b)(1) of the Act, 21 U.S.C. section 360bbb-3(b)(1), unless the authorization is terminated or revoked sooner.  Performed at [facility], [address] [place], [place] [address]   SARS Coronavirus 2 by RT PCR (hospital order, performed in [facility] hospital lab) Nasopharyngeal Nasopharyngeal Swab     Status: None   Collection Time: 04/21/20  8:41 PM   Specimen: Nasopharyngeal Swab  Result Value Ref Range Status   SARS Coronavirus 2 NEGATIVE NEGATIVE Final    Comment: (NOTE) SARS-CoV-2 target nucleic acids are NOT DETECTED.  The SARS-CoV-2 RNA is generally detectable in upper and lower respiratory specimens during the acute phase of infection. The lowest concentration of SARS-CoV-2 viral copies this assay can detect is 250 copies / mL. A negative result does not preclude SARS-CoV-2 infection and should not be used as the sole basis for treatment or other patient management decisions.  A negative result may occur with improper specimen collection / handling, submission of specimen other than nasopharyngeal swab, presence of viral mutation(s) within the areas targeted by this assay, and inadequate number of viral copies (<250 copies / mL). A negative result must be combined with clinical observations, patient history, and epidemiological information.  Fact Sheet for Patients:   StrictlyIdeas.no  Fact Sheet for Healthcare Providers: BankingDealers.co.za  This test is not yet approved or  cleared by the [place] FDA and has been authorized for detection and/or  diagnosis of SARS-CoV-2 by FDA under an Emergency Use Authorization (EUA).  This EUA will remain in effect (meaning this test can be used) for the duration of the COVID-19 declaration under Section 564(b)(1) of the Act, 21 U.S.C. section 360bbb-3(b)(1), unless the authorization is terminated or revoked sooner.  Performed at [facility], [address] [place],  [address]     Radiology Reports DG Chest  2 View  Result Date: 04/12/2020 CLINICAL DATA:  Short of breath EXAM: CHEST - 2 VIEW COMPARISON:  03/06/2020, 02/12/2020 FINDINGS: Right-sided central venous port tip over the SVC. Post sternotomy changes with valve prostheses. Cardiomegaly. Small right greater than left pleural effusion. Scattered foci of sclerosis at the ribs and spine consistent with metastatic disease. Incompletely visualized pigtail catheter in the right upper quadrant. IMPRESSION: Cardiomegaly with small right greater than left pleural effusions. Scattered sclerotic foci overlying the ribs and spine, likely corresponding to known skeletal metastatic disease. Electronically Signed   By: Donavan Foil M.D.   On: 04/12/2020 21:47   CT Angio Chest PE W and/or Wo Contrast  Result Date: 04/21/2020 CLINICAL DATA:  Increasing shortness of breath, history of bladder cancer, DVT EXAM: CT ANGIOGRAPHY CHEST WITH CONTRAST TECHNIQUE: Multidetector CT imaging of the chest was performed using the standard protocol during bolus administration of intravenous contrast. Multiplanar CT image reconstructions and MIPs were obtained to evaluate the vascular anatomy. CONTRAST:  31mL OMNIPAQUE IOHEXOL 350 MG/ML SOLN COMPARISON:  CT 02/02/2020 FINDINGS: Cardiovascular: Satisfactory opacification of the pulmonary arteries to the segmental level. Suspect filling defects within the segmental branches of the posterior basal segment right lower lobe (5/190) though difficult to fully assess given hypoventilatory changes/passive atelectasis in this region  as well as some respiratory motion artifact. No other convincing pulmonary arterial filling defects additional peripherally marginated segmental filling defects seen in the anterior segmental branches left upper lobe (5/79) and lateral basal segmental branch of the left lower lobe (5/172). Central pulmonary arteries remain normal caliber. Diffuse cardiomegaly with biatrial enlargement and reflux of contrast into the IVC and hepatic veins. Transcatheter aortic valve replacement is in position. Coronary artery calcifications are present. Rib right IJ Port-A-Cath tip terminates at the level of the superior cavoatrial junction. No other major venous abnormalities. Mediastinum/Nodes: Postsurgical changes from prior sternotomy. No mediastinal fluid or gas. Normal thyroid gland and thoracic inlet. No acute abnormality of the trachea or esophagus. No worrisome mediastinal, hilar or axillary adenopathy. Lungs/Pleura: Moderate right pleural effusion with subtotal atelectatic collapse of the right lower lobe with further subsegmental atelectatic changes in the right upper and middle lobes as well. Stable biapical pleuroparenchymal scarring and blebs with calcified right apical granuloma. Upper Abdomen: Coil pack seen in the central liver. Bilateral percutaneous nephrostomies. Small volume upper abdominal ascites. Musculoskeletal: Diffuse body wall edema. New extensive multifocal areas of sclerotic osseous metastatic disease throughout the axial skeleton, sternum and ribs as well as the bilateral scapula and right humerus. No acute fracture. Postsurgical changes from prior sternotomy with intact sternal sutures and bony fusion across the sternum. No worrisome chest wall lesions. Review of the MIP images confirms the above findings. IMPRESSION: 1. Segmental and subsegmental filling defects in the posterior basal segment right lower lobe, anterior segment left upper lobe and basal segmental left lower lobe. 2. Cardiomegaly with  biatrial enlargement and reflux of contrast into the IVC and hepatic veins, suggestive of elevated right heart pressure/right heart failure. 3. Moderate right pleural effusion with subtotal atelectatic collapse of the right lower lobe with further subsegmental atelectatic changes in the right upper and middle lobes as well. Possibly related to volume status though malignant effusion is not excluded. 4. Additional features of anasarca with circumferential body wall edema and upper abdominal ascites. 5. Extensive new multifocal areas of sclerotic osseous metastatic disease. 6. Prior transcatheter aortic valve replacement as well as sternotomy. 7. Coronary artery atherosclerosis. 8. Bilateral percutaneous nephrostomies. 9. Aortic Atherosclerosis (ICD10-I70.0).  Electronically Signed   By: Lovena Le M.D.   On: 04/21/2020 16:18   CT ABDOMEN PELVIS W CONTRAST  Result Date: 04/12/2020 CLINICAL DATA:  Abdomen pain possible biliary obstruction bilateral leg swelling EXAM: CT ABDOMEN AND PELVIS WITH CONTRAST TECHNIQUE: Multidetector CT imaging of the abdomen and pelvis was performed using the standard protocol following bolus administration of intravenous contrast. CONTRAST:  128mL OMNIPAQUE IOHEXOL 300 MG/ML  SOLN COMPARISON:  CT 03/06/2020, 02/02/2020 FINDINGS: Lower chest: Lung bases demonstrate new small moderate right pleural effusion. Marked cardiomegaly with multi chamber enlargement. Hepatobiliary: Mild loculated fluid along the anterior aspect of the liver. No focal hepatic abnormality. No calcified gallstone or biliary dilatation. Embolization coils at the porta hepatis. Pancreas: Unremarkable. No pancreatic ductal dilatation or surrounding inflammatory changes. Spleen: Unchanged lobulated spleen with coarse calcification. Adrenals/Urinary Tract: Adrenal glands are within normal limits. Bilateral nephrostomy tubes remain in place. No significant hydronephrosis. Cortical scarring within both kidneys. Slightly  dense exophytic lesion off the upper pole right kidney measuring 2.3 x 1.4 cm, previously 2.3 x 1.5 cm. Eccentric right bladder mass measures slightly smaller today, 7 cm AP x 5 cm transverse compared with 8.4 x 4.5 cm previously. Possible right seminal vesicle tumor invasion as before. Stomach/Bowel: The stomach is decompressed. No dilated small bowel. No bowel wall thickening. Vascular/Lymphatic: Tortuous aorta with atherosclerosis. No aneurysm. Decreased retroperitoneal and pelvic adenopathy. For example left Peri aortic lymph node measures 8 mm compared with 11 mm previously, series 2, image number 19. Right external iliac node now measures 17 mm, compared with 3.7 cm previously. Reproductive: Prostate enlarged. 2.4 cm relative area of hyperenhancement within the left posterior gland. Other: No free air. Extensive subcutaneous edema consistent with anasarca. Large bilateral scrotal fluid collections. Musculoskeletal: Multiple sclerotic foci within the pelvic bones, spine and ribs consistent with osseous metastatic disease. Some of the lesions appear increased in size with more dense sclerosis, for example left inferior pubic ramus lesion measures 22 mm compared with 20 mm previously. Increased size of sclerotic lesions within the lumbar vertebra. IMPRESSION: 1. Negative for calcified gallstone or biliary dilatation. 2. Extensive subcutaneous edema consistent with generalized anasarca with large bilateral scrotal fluid collections. Cardiomegaly with small moderate right pleural effusion. 3. Slight interval decrease in size of eccentric right bladder mass. Decreased retroperitoneal and pelvic adenopathy. New small amount loculated right perihepatic fluid. 4. Slight interval decrease in size of eccentric right bladder mass with possible invasion of right seminal vesicle. Decreased retroperitoneal and pelvic adenopathy compared to most recent prior. 5. Stable positioning of bilateral nephrostomy tubes without  hydronephrosis 6. Stable slightly dense exophytic lesion off the upper pole of right kidney 7. New focal area of hyperenhancement within the left posterior prostate gland, indeterminate for a prostate mass. 8. Diffuse skeletal metastatic disease. Interval enlargement of multiple lesions involving the spine and pelvis. Aortic Atherosclerosis (ICD10-I70.0). Electronically Signed   By: Donavan Foil M.D.   On: 04/12/2020 23:49   DG Chest Port 1 View  Result Date: 04/21/2020 CLINICAL DATA:  Increasing shortness of breath since last night. EXAM: PORTABLE CHEST 1 VIEW COMPARISON:  Radiograph 04/12/2020.  CT 02/02/2020 FINDINGS: Accessed right chest port in place. Post median sternotomy. Stable cardiomegaly and prosthetic aortic valve/root. There is new volume loss at the right lung base with ill-defined opacity. Minimal fluid in the right minor fissure versus adjacent atelectasis. The left lung is clear. There is no pulmonary edema. No pneumothorax. Blunting of the right costophrenic angle may be chronic or small  effusion. IMPRESSION: 1. New volume loss at the right lung base with ill-defined opacity. This may represent atelectasis, pneumonia or potentially aspiration in the appropriate clinical setting. Possible small right pleural effusion. 2. Stable cardiomegaly. Electronically Signed   By: Keith Rake M.D.   On: 04/21/2020 15:03   IR NEPHROSTOMY EXCHANGE LEFT  Result Date: 04/05/2020 CLINICAL DATA:  Metastatic urothelial cancer, status post percutaneous nephrostomy catheter placement 02/22/2020. Patient presents for his first scheduled nephrostomy exchange. No problems with the catheters thus far. EXAM: BILATERAL PERCUTANEOUS NEPHROSTOMY CATHETER EXCHANGE UNDER FLUOROSCOPY TECHNIQUE: The procedure, risks (including but not limited to bleeding, infection, organ damage ), benefits, and alternatives were explained to the patient. Questions regarding the procedure were encouraged and answered. The patient  understands and consents to the procedure. The nephrostomy tubes and surrounding skin were prepped with Betadine, draped in usual sterile fashion. A small amount of contrast was injected through the right nephrostomy catheter to opacify the renal collecting system. The catheter was cut and exchanged over a 0.035" angiographic wire for a new 10-French pigtail catheter, formed centrally within the collecting system under fluoroscopy. Contrast injection confirms appropriate positioning. In a similar fashion, the left nephrostomy catheter was injected, cut, and exchanged for a new 10-French pigtail catheter, formed centrally within the left renal collecting system. Injection confirms appropriate positioning and patency. There is very little in the way of deposits or debris associated with the removed catheters. Both catheters were secured externally with 0 Prolene sutures. The patient tolerated the procedure well. FLUOROSCOPY TIME:  15 mGy; 1 minutes COMPLICATIONS: None immediate IMPRESSION: 1. Technically successful exchange of bilateral nephrostomy catheters under fluoroscopy. Follow-up 8 weeks. Electronically Signed   By: Lucrezia Europe M.D.   On: 04/05/2020 15:47   IR NEPHROSTOMY EXCHANGE RIGHT  Result Date: 04/05/2020 CLINICAL DATA:  Metastatic urothelial cancer, status post percutaneous nephrostomy catheter placement 02/22/2020. Patient presents for his first scheduled nephrostomy exchange. No problems with the catheters thus far. EXAM: BILATERAL PERCUTANEOUS NEPHROSTOMY CATHETER EXCHANGE UNDER FLUOROSCOPY TECHNIQUE: The procedure, risks (including but not limited to bleeding, infection, organ damage ), benefits, and alternatives were explained to the patient. Questions regarding the procedure were encouraged and answered. The patient understands and consents to the procedure. The nephrostomy tubes and surrounding skin were prepped with Betadine, draped in usual sterile fashion. A small amount of contrast was  injected through the right nephrostomy catheter to opacify the renal collecting system. The catheter was cut and exchanged over a 0.035" angiographic wire for a new 10-French pigtail catheter, formed centrally within the collecting system under fluoroscopy. Contrast injection confirms appropriate positioning. In a similar fashion, the left nephrostomy catheter was injected, cut, and exchanged for a new 10-French pigtail catheter, formed centrally within the left renal collecting system. Injection confirms appropriate positioning and patency. There is very little in the way of deposits or debris associated with the removed catheters. Both catheters were secured externally with 0 Prolene sutures. The patient tolerated the procedure well. FLUOROSCOPY TIME:  15 mGy; 1 minutes COMPLICATIONS: None immediate IMPRESSION: 1. Technically successful exchange of bilateral nephrostomy catheters under fluoroscopy. Follow-up 8 weeks. Electronically Signed   By: Lucrezia Europe M.D.   On: 04/05/2020 15:47     CBC Recent Labs  Lab 04/21/20 1412 04/22/20 0429  WBC 6.2 6.8  HGB 9.4* 8.9*  HCT 29.2* 28.3*  PLT 299 283  MCV 82.3 82.0  MCH 26.5 25.8*  MCHC 32.2 31.4  RDW 27.2* 27.0*  LYMPHSABS 1.0  --  MONOABS 1.0  --   EOSABS 0.1  --   BASOSABS 0.0  --     Chemistries  Recent Labs  Lab 04/16/20 0416 04/21/20 1412 04/22/20 0429  NA 123* 123* 126*  K 5.0 5.0 4.0  CL 91* 90* 91*  CO2 22 23 25   GLUCOSE 95 104* 126*  BUN 33* 27* 30*  CREATININE 1.15 0.95 1.15  CALCIUM 7.9* 7.8* 7.9*  MG 1.8  --   --   AST 32 31  --   ALT 94* 51*  --   ALKPHOS 353* 378*  --   BILITOT 0.9 0.5  --    ------------------------------------------------------------------------------------------------------------------ No results for input(s): CHOL, HDL, LDLCALC, TRIG, CHOLHDL, LDLDIRECT in the last 72 hours.  No results found for:  HGBA1C ------------------------------------------------------------------------------------------------------------------ No results for input(s): TSH, T4TOTAL, T3FREE, THYROIDAB in the last 72 hours.  Invalid input(s): FREET3 ------------------------------------------------------------------------------------------------------------------ No results for input(s): VITAMINB12, FOLATE, FERRITIN, TIBC, IRON, RETICCTPCT in the last 72 hours.  Coagulation profile No results for input(s): INR, PROTIME in the last 168 hours.  No results for input(s): DDIMER in the last 72 hours.  Cardiac Enzymes No results for input(s): CKMB, TROPONINI, MYOGLOBIN in the last 168 hours.  Invalid input(s): CK ------------------------------------------------------------------------------------------------------------------    Component Value Date/Time   BNP >4,500.0 (H) 04/21/2020 1412     Roxan Hockey M.D on 04/22/2020 at 8:25 PM  Go to [email or web address] - for contact info  [facility] - Office  [phone]

## 2020-04-23 ENCOUNTER — Inpatient Hospital Stay (HOSPITAL_COMMUNITY): Payer: Medicare Other

## 2020-04-23 ENCOUNTER — Other Ambulatory Visit: Payer: Self-pay | Admitting: *Deleted

## 2020-04-23 ENCOUNTER — Telehealth: Payer: Self-pay | Admitting: Family Medicine

## 2020-04-23 DIAGNOSIS — I371 Nonrheumatic pulmonary valve insufficiency: Secondary | ICD-10-CM

## 2020-04-23 DIAGNOSIS — I340 Nonrheumatic mitral (valve) insufficiency: Secondary | ICD-10-CM

## 2020-04-23 DIAGNOSIS — I361 Nonrheumatic tricuspid (valve) insufficiency: Secondary | ICD-10-CM

## 2020-04-23 DIAGNOSIS — N390 Urinary tract infection, site not specified: Secondary | ICD-10-CM

## 2020-04-23 DIAGNOSIS — C7910 Secondary malignant neoplasm of unspecified urinary organs: Secondary | ICD-10-CM

## 2020-04-23 LAB — CBC
HCT: 29.3 % — ABNORMAL LOW (ref 39.0–52.0)
Hemoglobin: 9.4 g/dL — ABNORMAL LOW (ref 13.0–17.0)
MCH: 26.5 pg (ref 26.0–34.0)
MCHC: 32.1 g/dL (ref 30.0–36.0)
MCV: 82.5 fL (ref 80.0–100.0)
Platelets: 302 10*3/uL (ref 150–400)
RBC: 3.55 MIL/uL — ABNORMAL LOW (ref 4.22–5.81)
RDW: 27.3 % — ABNORMAL HIGH (ref 11.5–15.5)
WBC: 8.2 10*3/uL (ref 4.0–10.5)
nRBC: 0 % (ref 0.0–0.2)

## 2020-04-23 LAB — URINALYSIS, ROUTINE W REFLEX MICROSCOPIC
Bilirubin Urine: NEGATIVE
Glucose, UA: NEGATIVE mg/dL
Ketones, ur: NEGATIVE mg/dL
Nitrite: NEGATIVE
Protein, ur: 30 mg/dL — AB
Specific Gravity, Urine: 1.016 (ref 1.005–1.030)
pH: 6 (ref 5.0–8.0)

## 2020-04-23 LAB — BASIC METABOLIC PANEL
Anion gap: 12 (ref 5–15)
BUN: 30 mg/dL — ABNORMAL HIGH (ref 8–23)
CO2: 24 mmol/L (ref 22–32)
Calcium: 8 mg/dL — ABNORMAL LOW (ref 8.9–10.3)
Chloride: 88 mmol/L — ABNORMAL LOW (ref 98–111)
Creatinine, Ser: 0.96 mg/dL (ref 0.61–1.24)
GFR calc Af Amer: >60 mL/min (ref >60)
GFR calc non Af Amer: >60 mL/min (ref >60)
Glucose, Bld: 91 mg/dL (ref 70–99)
Potassium: 4.1 mmol/L (ref 3.5–5.1)
Sodium: 124 mmol/L — ABNORMAL LOW (ref 135–145)

## 2020-04-23 LAB — ECHOCARDIOGRAM COMPLETE
AR max vel: 1.56 cm2
AV Area VTI: 1.48 cm2
AV Area mean vel: 1.61 cm2
AV Mean grad: 8 mmHg
AV Peak grad: 16.5 mmHg
Ao pk vel: 2.03 m/s
Area-P 1/2: 4.89 cm2
Calc EF: 32.8 %
Height: 64 in
MV M vel: 4.76 m/s
MV Peak grad: 90.6 mmHg
P 1/2 time: 354 msec
Radius: 0.5 cm
S' Lateral: 5.69 cm
Single Plane A2C EF: 35.9 %
Single Plane A4C EF: 29.4 %
Weight: 2194.02 oz

## 2020-04-23 LAB — HEPARIN LEVEL (UNFRACTIONATED)
Heparin Unfractionated: 1 IU/mL — ABNORMAL HIGH (ref 0.30–0.70)
Heparin Unfractionated: 0.44 IU/mL (ref 0.30–0.70)
Heparin Unfractionated: 0.36 IU/mL (ref 0.30–0.70)

## 2020-04-23 MED ORDER — OXYCODONE HCL 5 MG PO TABS
5.0000 mg | ORAL_TABLET | ORAL | Status: DC | PRN
  Administered 2020-04-23 – 2020-04-25 (×4): 5 mg via ORAL
  Filled 2020-04-23 (×4): qty 1

## 2020-04-23 MED ORDER — CHLORHEXIDINE GLUCONATE CLOTH 2 % EX PADS
6.0000 | MEDICATED_PAD | Freq: Every day | CUTANEOUS | Status: DC
  Administered 2020-04-25: 6 via TOPICAL

## 2020-04-23 NOTE — Telephone Encounter (Signed)
Noted, thank you

## 2020-04-23 NOTE — Plan of Care (Signed)
  Problem: Acute Rehab OT Goals (only OT should resolve) Goal: Pt. Will Perform Grooming Flowsheets (Taken 04/23/2020 0922) Pt Will Perform Grooming:  with modified independence  sitting  standing Goal: Pt. Will Perform Lower Body Dressing Flowsheets (Taken 04/23/2020 0922) Pt Will Perform Lower Body Dressing:  with supervision  sitting/lateral leans  sit to/from stand Goal: Pt. Will Transfer To Toilet Flowsheets (Taken 04/23/2020 [phone]) Pt Will Transfer to Toilet:  with supervision  ambulating  regular height toilet Goal: Pt. Will Perform Toileting-Clothing Manipulation Flowsheets (Taken 04/23/2020 0922) Pt Will Perform Toileting - Clothing Manipulation and hygiene:  with supervision  sitting/lateral leans  sit to/from stand Goal: Pt/Caregiver Will Perform Home Exercise Program Flowsheets (Taken 04/23/2020 [phone]) Pt/caregiver will Perform Home Exercise Program:  Increased strength  Both right and left upper extremity  Independently  With written HEP provided

## 2020-04-23 NOTE — Progress Notes (Signed)
ANTICOAGULATION CONSULT NOTE -  Pharmacy Consult for Heparin Indication: pulmonary embolus  No Known Allergies  Patient Measurements: Height: 5\' 4"  (162.6 cm) Weight: 62.2 kg (137 lb 2 oz) IBW/kg (Calculated) : 59.2 HEPARIN DW (KG): 62.2  Vital Signs: Temp: 98.5 F (36.9 C) (08/03 1300) Temp Source: Oral (08/03 1300) BP: 109/67 (08/03 1300) Pulse Rate: 75 (08/03 1300)  Labs: Recent Labs     0000 04/21/20 1412 04/21/20 1630 04/22/20 0120 04/22/20 0429 04/23/20 0556 04/23/20 1534  HGB   < > 9.4*  --   --  8.9* 9.4*  --   HCT  --  29.2*  --   --  28.3* 29.3*  --   PLT  --  299  --   --  283 302  --   HEPARINUNFRC  --   --   --    < > 0.47 1.00* 0.44  CREATININE  --  0.95  --   --  1.15 0.96  --   TROPONINIHS  --  32* 32*  --   --   --   --    < > = values in this interval not displayed.    Estimated Creatinine Clearance: 61.7 mL/min (by C-G formula based on SCr of 0.96 mg/dL).   Medical History: Past Medical History:  Diagnosis Date  . Arterial embolus of lower extremity ([place]) 11/23/2013  . Arthritis   . CAD (coronary artery disease)    a. s/p PCI with 3.5 x 18 BMS to mid RCA, non obst LAD, Lcx patent 08/10/13  . Cancer ([place]) 2021   bladder  . Depression, major, single episode, mild ([place]) 12/20/2019  . ED (erectile dysfunction) 03/27/2015  . Embolus of femoral artery [facility]) 2012   Surgical repair, previously on Coumadin  . Essential hypertension   . ETOH abuse   . Hematuria 10/17/2019  . Hernia of abdominal cavity 02/27/2019  . History of DVT (deep vein thrombosis) 2011  . History of GI bleed    February 2012   . Ingrown left greater toenail 09/04/2018  . 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 Low oxygen saturation 01/31/2020  . 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 Pain with urination 10/17/2019  . Port-A-Cath in place 02/23/2020  . Prosthetic valve endocarditis ([place])   . PVD  (peripheral vascular disease) ([place])   . S/P aortic valve replacement    Details not clear -  reportedly 1971 per patient    Medications:  See med rec  Assessment: 69 y.o. male with a history of CAD with PCI, history of femoral artery embolus, also history of DVT, with recent diagnosis of kidney cancer currently with bilateral nephrostomy tubes, was discharged from the hospital 6 days ago. He returns today for worsening shortness of breath.  He states he has had a slowly progressive worsening of symptoms. ED MD is concerned with PE, pharmacy asked to start heparin. Reviewed home medications and is not currently on any oral anticoagulants.  HL therapeutic at 0.44  Goal of Therapy:  Heparin level 0.3-0.7 units/ml Monitor platelets by anticoagulation protocol: Yes   Plan:  Continue heparin infusion at 950 units/hr Check anti-Xa level in 6-8 hours and daily Continue to monitor H&H and platelets  Margot Ables, PharmD Clinical Pharmacist 04/23/2020 4:48 PM

## 2020-04-23 NOTE — Telephone Encounter (Signed)
[facility] nurse called to notify the office that pt was admitted to [facility] Sunday 03/21/20 for respiratory issues.

## 2020-04-23 NOTE — Evaluation (Signed)
Occupational Therapy Evaluation Patient Details Name: [name] MRN: [id number] DOB: [birth year] Today's Date: 04/23/2020    History of Present Illness [name] is a 69 y.o. male with medical history significant of CAD with PCI, history of femoral artery embolus, also history of DVT, with recent diagnosis of urothelial cancer with hydronephrosis 2/2 distal obstruction currently with bilateral nephrostomy tubes. He was discharged from the hospital 04/15/20 ago having been admitted for shortness of breath  secondary to CHF, returns today for worsening shortness of breath and UTI. He was to take Augmentin, based on culture ss but had not started.  He states he has had a slowly progressive worsening of symptoms, currently having difficulty speaking in full sentences, states his symptoms are worse at night and worse with exertion, better but not great when he is sitting in an upright position.  He denies fevers or chills.  He woke this morning with some dry heaves and endorses reduced appetite which has been a chronic problem since starting chemotherapy, but no current nausea or vomiting.  He describes a midsternal chest pressure sensation which is worse with cough episodes which have been nonproductive.  He is scheduled for his next chemotherapy treatment tomorrow morning.  He endorses bilateral lower extremity edema which is chronic but worsened today, stating he has swelling to his knees.  He has been taking his increased Lasix 40 mg twice daily dosing.  He has not been checking his weights.  Denies exposure to COVID-19 and has been fully vaccinated.    Clinical Impression   Pt agreeable to OT evaluation this am. Pt reports he just had his bath and does not need to complete any additional ADLs this am. Pt performing bed to chair transfer with supervision. BUE strength is WFL, limited due to poor activity tolerance. Recommend SNF on discharge to improve safety and independence in ADL completion  and functional mobility.     Follow Up Recommendations  SNF    Equipment Recommendations  None recommended by OT       Precautions / Restrictions Precautions Precautions: Fall Restrictions Weight Bearing Restrictions: No      Mobility Bed Mobility               General bed mobility comments: Pt seated at EOB on OT arrival  Transfers Overall transfer level: Needs assistance Equipment used: Rolling walker (2 wheeled) Transfers: Sit to/from [employer] Transfers Sit to Stand: Supervision Stand pivot transfers: Supervision                ADL either performed or assessed with clinical judgement   ADL Overall ADL's : Needs assistance/impaired Eating/Feeding: Modified independent;Sitting   Grooming: Modified independent;Sitting Grooming Details (indicate cue type and reason): seated on shower seat at sink for grooming tasks                 Toilet Transfer: Supervision/safety;Stand-pivot Toilet Transfer Details (indicate cue type and reason): simulated with bed to chair transfer                 Vision Baseline Vision/History: No visual deficits Patient Visual Report: No change from baseline Vision Assessment?: No apparent visual deficits            Pertinent Vitals/Pain Pain Assessment: No/denies pain     Hand Dominance Right   Extremity/Trunk Assessment Upper Extremity Assessment Upper Extremity Assessment: Generalized weakness   Lower Extremity Assessment Lower Extremity Assessment: Defer to PT evaluation   Cervical / Trunk Assessment  Cervical / Trunk Assessment: Normal   Communication Communication Communication: No difficulties   Cognition Arousal/Alertness: Awake/alert Behavior During Therapy: WFL for tasks assessed/performed Overall Cognitive Status: Within Functional Limits for tasks assessed                                                Home Living Family/patient expects to be discharged to::  Private residence Living Arrangements: Alone Available Help at Discharge: Family;Available PRN/intermittently Type of Home: House Home Access: Stairs to enter [employer] of Steps: 4 Entrance Stairs-Rails: Right Home Layout: One level           Bathroom Accessibility: Yes   Home Equipment: Walker - 2 wheels;Cane - single point          Prior Functioning/Environment Level of Independence: Independent with assistive device(s);Needs assistance  Gait / Transfers Assistance Needed: household and short distanced community ambulator ADL's / Homemaking Assistance Needed: pt reports HH aide and family assist with ADLs            OT Problem List: Decreased strength;Decreased activity tolerance;Impaired balance (sitting and/or standing);Decreased safety awareness;Decreased knowledge of use of DME or AE      OT Treatment/Interventions: Self-care/ADL training;Therapeutic exercise;DME and/or AE instruction;Therapeutic activities;Patient/family education    OT Goals(Current goals can be found in the care plan section) Acute Rehab OT Goals Patient Stated Goal: return home OT Goal Formulation: With patient Time For Goal Achievement: 05/07/20 Potential to Achieve Goals: Good  OT Frequency: Min 2X/week                 End of Session    Activity Tolerance: Patient limited by fatigue Patient left: in chair;with call bell/phone within reach  OT Visit Diagnosis: Muscle weakness (generalized) (M62.81)                Time: 2010-0712 OT Time Calculation (min): 11 min Charges:  OT General Charges $OT Visit: 1 Visit OT Evaluation $OT Eval Moderate Complexity: [place], OTR/L  [phone] 04/23/2020, 8:50 AM

## 2020-04-23 NOTE — Progress Notes (Signed)
ANTICOAGULATION CONSULT NOTE - Pharmacy Consult for Heparin Indication: pulmonary embolus  No Known Allergies  Patient Measurements: Height: 5\' 4"  (162.6 cm) Weight: 62.2 kg (137 lb 2 oz) IBW/kg (Calculated) : 59.2 HEPARIN DW (KG): 62.2  Vital Signs: Temp: 98.2 F (36.8 C) (08/03 2143) Temp Source: Oral (08/03 2143) BP: 109/82 (08/03 2143) Pulse Rate: 71 (08/03 2143)  Labs: Recent Labs     0000 04/21/20 1412 04/21/20 1630 04/22/20 0120 04/22/20 0429 04/22/20 0429 04/23/20 0556 04/23/20 1534 04/23/20 2202  HGB   < > 9.4*  --   --  8.9*  --  9.4*  --   --   HCT  --  29.2*  --   --  28.3*  --  29.3*  --   --   PLT  --  299  --   --  283  --  302  --   --   HEPARINUNFRC  --   --   --    < > 0.47   < > 1.00* 0.44 0.36  CREATININE  --  0.95  --   --  1.15  --  0.96  --   --   TROPONINIHS  --  32* 32*  --   --   --   --   --   --    < > = values in this interval not displayed.    Estimated Creatinine Clearance: 61.7 mL/min (by C-G formula based on SCr of 0.96 mg/dL).  Assessment: 68 y.o. male with PE for heparin  Goal of Therapy:  Heparin level 0.3-0.7 units/ml Monitor platelets by anticoagulation protocol: Yes   Plan:  Continue Heparin at current rate   Phillis Knack, PharmD, BCPS  04/23/2020 11:18 PM

## 2020-04-23 NOTE — TOC Progression Note (Signed)
Transition of Care [facility]) - Progression Note    Patient Details  Name: [name] MRN: [id number] Date of Birth: 1951/01/25  [facility] CM/SW Contact  Shade Flood, LCSW Phone Number: 04/23/2020, 3:48 PM  Clinical Narrative:     TOC following. Reviewed bed offers with pt who selects [facility]. Voicemail message left for Melissa at [facility] to update. MD anticipating dc Thursday.  Notified Caren Griffins at [facility] of pt's SNF selection. She states a rep will call with final authorization information. Covering TOC will need to update insurance if pt does not dc on Wednesday.  TOC will follow.  Expected Discharge Plan: Skilled Nursing Facility Barriers to Discharge: Continued Medical Work up  Expected Discharge Plan and Services Expected Discharge Plan: [place] In-house Referral: Clinical Social Work Discharge Planning Services: NA Post Acute Care Choice: [place] arrangements for the past 2 months: [place]: RN, PT HH Agency: [place] Date [facility] Agency Contacted: 04/22/20 Time [employer]: [phone] Representative spoke with at [place]: [place] ([place]) Interventions    Readmission Risk Interventions Readmission Risk Prevention Plan 04/22/2020 03/07/2020  Transportation Screening Complete Complete  PCP or Specialist Appt within 3-5 Days - Not Complete  Not Complete comments - Assessment completed during second shift  [place] or [place] - Complete  Social Work Consult for [place] Planning/Counseling - Complete  Palliative Care Screening - Not Applicable  Medication Review Press photographer) Complete Complete  PCP or Specialist appointment within 3-5 days of discharge Complete -  [place] or Home Care Consult Complete -  SW Recovery Care/Counseling Consult Complete -  Palliative Care Screening Complete -  [place] Not Applicable -  Some recent data might be hidden

## 2020-04-23 NOTE — Progress Notes (Signed)
*  PRELIMINARY RESULTS* Echocardiogram 2D Echocardiogram has been performed.  [name] 04/23/2020, 3:53 PM

## 2020-04-23 NOTE — Progress Notes (Signed)
Patient Demographics:    [name], is a 69 y.o. male, DOB - 07-23-1951, LZJ:[id number]  Admit date - 04/21/2020   Admitting Physician Neena Rhymes, MD  Outpatient Primary MD for the patient is [name], MD  LOS - 2   Chief Complaint  Patient presents with  . Shortness of Breath        Subjective:    [name] today has no fevers, no emesis,   Male visitor at bedside DOE persist  -  Assessment  & Plan :    Principal Problem:   Pulmonary embolism ([place]) Active Problems:   Acute on chronic HFrEF (heart failure with reduced ejection fraction) -combined systolic and diastolic dysfunction CHF   Essential hypertension, benign   Metastatic urothelial carcinoma (HCC)   UTI (urinary tract infection)   Acute on chronic diastolic (congestive) heart failure (HCC)   Brief Summary: 69 y.o.malewith a history of CAD with PCI, history of femoral artery embolus, also history of DVT, with recent diagnosis of kidney cancer currently with bilateral nephrostomy tubes admitted on 04/22/2019 for bilateral PE after being recently discharged on 04/17/2020   A/p 1)Bilateral PE in the setting of underlying malignancy--- patient with prior history of DVT, - IV heparin for now---- will most likely need lifelong anticoagulation -Await echocardiogram to evaluate for right heart strain May need prolonged IV heparin prior to transitioning to NOAC  2)HFrEF--patient with acute on chronic combined diastolic and systolic dysfunction CHF--- last known EF 45 to 50% based on echo from 01/05/2020 -c/n lasix, aldactone, and Lisinopril  3)-- Metatstatic Urothelial Carcinoma - new evidence of diffuse bony mets-----urothelial cancer with hydronephrosis 2/2 distal obstruction currently with bilateral nephrostomy tubes--- patient will need ongoing follow-up with IR and urology, as well as oncology, he was  scheduled to get chemotherapy on 04/23/2020  4)Social/Ethics--patient is a full code, palliative care consult requested  5)Recent urine culture with Acinetobacter and Enterococcus--- patient was treated in-house and then discharged on Augmentin which is being continued at this time we will get repeat UA  6)Acute respiratory failure with hypoxia--- secondary to #1 above --Resolved, no further hypoxia at this time   Disposition/Need for in-Hospital Stay- patient unable to be discharged at this time due to --- bilateral PE requiring IV heparin  Status is: Inpatient  Remains inpatient appropriate because:- bilateral PE requiring IV heparin  Disposition: The patient is from: Home              Anticipated d/c is to: SNF              Anticipated d/c date is: 2 days              Patient currently is not medically stable to d/c. Barriers: Not Clinically Stable- - bilateral PE requiring IV heparin   Code Status : full  Family Communication:    (patient is alert, awake and coherent)   Consults  :  palliative  DVT Prophylaxis  : IV heparin  Lab Results  Component Value Date   PLT 302 04/23/2020    Inpatient Medications  Scheduled Meds: . amoxicillin-clavulanate  1 tablet Oral Q12H  . atorvastatin  40 mg Oral q1800  . clonazePAM  0.5 mg Oral QHS  .  docusate sodium  100 mg Oral BID  . fluticasone  2 spray Each Nare Daily  . lisinopril  5 mg Oral Daily  . magnesium oxide  400 mg Oral BID  . metoprolol succinate  50 mg Oral Daily  . montelukast  10 mg Oral QHS  . pantoprazole  40 mg Oral QAC breakfast  . potassium chloride  10 mEq Oral Daily  . sodium chloride flush  3 mL Intravenous Q12H  . sodium chloride flush  3 mL Intravenous Q12H  . spironolactone  12.5 mg Oral Daily   Continuous Infusions: . sodium chloride    . sodium chloride    . heparin 950 Units/hr (04/23/20 1141)   PRN Meds:.sodium chloride, sodium chloride, acetaminophen, nitroGLYCERIN, ondansetron (ZOFRAN) IV,  prochlorperazine, sodium chloride flush, sodium chloride flush    Anti-infectives (From admission, onward)   Start     Dose/Rate Route Frequency Ordered Stop   04/21/20 2200  amoxicillin-clavulanate (AUGMENTIN) 875-125 MG per tablet 1 tablet     Discontinue     1 tablet Oral Every 12 hours 04/21/20 2026          Objective:   Vitals:   04/22/20 1756 04/22/20 2158 04/23/20 0221 04/23/20 0900  BP: 125/73 118/67 123/70 (!) 142/71  Pulse: 73 71 67 72  Resp: 20 20 20 19   Temp: 98.6 F (37 C) 98.6 F (37 C) 98.4 F (36.9 C) 97.9 F (36.6 C)  TempSrc: Oral Oral Oral Oral  SpO2: 100% 97% 98% 100%  Weight:      Height:        Wt Readings from Last 3 Encounters:  04/22/20 62.2 kg  04/17/20 60.3 kg  04/04/20 54.8 kg    Intake/Output Summary (Last 24 hours) at 04/23/2020 1330 Last data filed at 04/23/2020 0500 Gross per 24 hour  Intake 507.15 ml  Output 800 ml  Net -292.85 ml   Physical Exam  Gen:- Awake Alert, no apparent distress, some DOE HEENT:- [place].AT, No sclera icterus Neck-Supple Neck,No JVD,.  Lungs- diminished in bases with scattered rales CV- S1, S2 normal, regular  Abd-  +ve B.Sounds, Abd Soft, No tenderness, bilateral Nephrostomy tubes Extremity/Skin:- No  edema, pedal pulses present  Psych-affect is appropriate, oriented x3 Neuro-Generalized weakness, no new focal deficits, no tremors   Data Review:   Micro Results Recent Results (from the past 240 hour(s))  SARS Coronavirus 2 by RT PCR (hospital order, performed in [facility] hospital lab) Nasopharyngeal Nasopharyngeal Swab     Status: None   Collection Time: 04/21/20  8:41 PM   Specimen: Nasopharyngeal Swab  Result Value Ref Range Status   SARS Coronavirus 2 NEGATIVE NEGATIVE Final    Comment: (NOTE) SARS-CoV-2 target nucleic acids are NOT DETECTED.  The SARS-CoV-2 RNA is generally detectable in upper and lower respiratory specimens during the acute phase of infection. The lowest concentration of  SARS-CoV-2 viral copies this assay can detect is 250 copies / mL. A negative result does not preclude SARS-CoV-2 infection and should not be used as the sole basis for treatment or other patient management decisions.  A negative result may occur with improper specimen collection / handling, submission of specimen other than nasopharyngeal swab, presence of viral mutation(s) within the areas targeted by this assay, and inadequate number of viral copies (<250 copies / mL). A negative result must be combined with clinical observations, patient history, and epidemiological information.  Fact Sheet for Patients:   StrictlyIdeas.no  Fact Sheet for Healthcare Providers: BankingDealers.co.za  This test is not yet approved or  cleared by the [place] and has been authorized for detection and/or diagnosis of SARS-CoV-2 by FDA under an Emergency Use Authorization (EUA).  This EUA will remain in effect (meaning this test can be used) for the duration of the COVID-19 declaration under Section 564(b)(1) of the Act, 21 U.S.C. section 360bbb-3(b)(1), unless the authorization is terminated or revoked sooner.  Performed at [facility], [address], [place], [place] [address]     Radiology Reports DG Chest 2 View  Result Date: 04/12/2020 CLINICAL DATA:  Short of breath EXAM: CHEST - 2 VIEW COMPARISON:  03/06/2020, 02/12/2020 FINDINGS: Right-sided central venous port tip over the SVC. Post sternotomy changes with valve prostheses. Cardiomegaly. Small right greater than left pleural effusion. Scattered foci of sclerosis at the ribs and spine consistent with metastatic disease. Incompletely visualized pigtail catheter in the right upper quadrant. IMPRESSION: Cardiomegaly with small right greater than left pleural effusions. Scattered sclerotic foci overlying the ribs and spine, likely corresponding to known skeletal metastatic disease. Electronically  Signed   By: Donavan Foil M.D.   On: 04/12/2020 21:47   CT Angio Chest PE W and/or Wo Contrast  Result Date: 04/21/2020 CLINICAL DATA:  Increasing shortness of breath, history of bladder cancer, DVT EXAM: CT ANGIOGRAPHY CHEST WITH CONTRAST TECHNIQUE: Multidetector CT imaging of the chest was performed using the standard protocol during bolus administration of intravenous contrast. Multiplanar CT image reconstructions and MIPs were obtained to evaluate the vascular anatomy. CONTRAST:  60mL OMNIPAQUE IOHEXOL 350 MG/ML SOLN COMPARISON:  CT 02/02/2020 FINDINGS: Cardiovascular: Satisfactory opacification of the pulmonary arteries to the segmental level. Suspect filling defects within the segmental branches of the posterior basal segment right lower lobe (5/190) though difficult to fully assess given hypoventilatory changes/passive atelectasis in this region as well as some respiratory motion artifact. No other convincing pulmonary arterial filling defects additional peripherally marginated segmental filling defects seen in the anterior segmental branches left upper lobe (5/79) and lateral basal segmental branch of the left lower lobe (5/172). Central pulmonary arteries remain normal caliber. Diffuse cardiomegaly with biatrial enlargement and reflux of contrast into the IVC and hepatic veins. Transcatheter aortic valve replacement is in position. Coronary artery calcifications are present. Rib right IJ Port-A-Cath tip terminates at the level of the superior cavoatrial junction. No other major venous abnormalities. Mediastinum/Nodes: Postsurgical changes from prior sternotomy. No mediastinal fluid or gas. Normal thyroid gland and thoracic inlet. No acute abnormality of the trachea or esophagus. No worrisome mediastinal, hilar or axillary adenopathy. Lungs/Pleura: Moderate right pleural effusion with subtotal atelectatic collapse of the right lower lobe with further subsegmental atelectatic changes in the right upper  and middle lobes as well. Stable biapical pleuroparenchymal scarring and blebs with calcified right apical granuloma. Upper Abdomen: Coil pack seen in the central liver. Bilateral percutaneous nephrostomies. Small volume upper abdominal ascites. Musculoskeletal: Diffuse body wall edema. New extensive multifocal areas of sclerotic osseous metastatic disease throughout the axial skeleton, sternum and ribs as well as the bilateral scapula and right humerus. No acute fracture. Postsurgical changes from prior sternotomy with intact sternal sutures and bony fusion across the sternum. No worrisome chest wall lesions. Review of the MIP images confirms the above findings. IMPRESSION: 1. Segmental and subsegmental filling defects in the posterior basal segment right lower lobe, anterior segment left upper lobe and basal segmental left lower lobe. 2. Cardiomegaly with biatrial enlargement and reflux of contrast into the IVC and hepatic veins, suggestive of elevated  right heart pressure/right heart failure. 3. Moderate right pleural effusion with subtotal atelectatic collapse of the right lower lobe with further subsegmental atelectatic changes in the right upper and middle lobes as well. Possibly related to volume status though malignant effusion is not excluded. 4. Additional features of anasarca with circumferential body wall edema and upper abdominal ascites. 5. Extensive new multifocal areas of sclerotic osseous metastatic disease. 6. Prior transcatheter aortic valve replacement as well as sternotomy. 7. Coronary artery atherosclerosis. 8. Bilateral percutaneous nephrostomies. 9. Aortic Atherosclerosis (ICD10-I70.0). Electronically Signed   By: Lovena Le M.D.   On: 04/21/2020 16:18   CT ABDOMEN PELVIS W CONTRAST  Result Date: 04/12/2020 CLINICAL DATA:  Abdomen pain possible biliary obstruction bilateral leg swelling EXAM: CT ABDOMEN AND PELVIS WITH CONTRAST TECHNIQUE: Multidetector CT imaging of the abdomen and  pelvis was performed using the standard protocol following bolus administration of intravenous contrast. CONTRAST:  150mL OMNIPAQUE IOHEXOL 300 MG/ML  SOLN COMPARISON:  CT 03/06/2020, 02/02/2020 FINDINGS: Lower chest: Lung bases demonstrate new small moderate right pleural effusion. Marked cardiomegaly with multi chamber enlargement. Hepatobiliary: Mild loculated fluid along the anterior aspect of the liver. No focal hepatic abnormality. No calcified gallstone or biliary dilatation. Embolization coils at the porta hepatis. Pancreas: Unremarkable. No pancreatic ductal dilatation or surrounding inflammatory changes. Spleen: Unchanged lobulated spleen with coarse calcification. Adrenals/Urinary Tract: Adrenal glands are within normal limits. Bilateral nephrostomy tubes remain in place. No significant hydronephrosis. Cortical scarring within both kidneys. Slightly dense exophytic lesion off the upper pole right kidney measuring 2.3 x 1.4 cm, previously 2.3 x 1.5 cm. Eccentric right bladder mass measures slightly smaller today, 7 cm AP x 5 cm transverse compared with 8.4 x 4.5 cm previously. Possible right seminal vesicle tumor invasion as before. Stomach/Bowel: The stomach is decompressed. No dilated small bowel. No bowel wall thickening. Vascular/Lymphatic: Tortuous aorta with atherosclerosis. No aneurysm. Decreased retroperitoneal and pelvic adenopathy. For example left Peri aortic lymph node measures 8 mm compared with 11 mm previously, series 2, image number 19. Right external iliac node now measures 17 mm, compared with 3.7 cm previously. Reproductive: Prostate enlarged. 2.4 cm relative area of hyperenhancement within the left posterior gland. Other: No free air. Extensive subcutaneous edema consistent with anasarca. Large bilateral scrotal fluid collections. Musculoskeletal: Multiple sclerotic foci within the pelvic bones, spine and ribs consistent with osseous metastatic disease. Some of the lesions appear  increased in size with more dense sclerosis, for example left inferior pubic ramus lesion measures 22 mm compared with 20 mm previously. Increased size of sclerotic lesions within the lumbar vertebra. IMPRESSION: 1. Negative for calcified gallstone or biliary dilatation. 2. Extensive subcutaneous edema consistent with generalized anasarca with large bilateral scrotal fluid collections. Cardiomegaly with small moderate right pleural effusion. 3. Slight interval decrease in size of eccentric right bladder mass. Decreased retroperitoneal and pelvic adenopathy. New small amount loculated right perihepatic fluid. 4. Slight interval decrease in size of eccentric right bladder mass with possible invasion of right seminal vesicle. Decreased retroperitoneal and pelvic adenopathy compared to most recent prior. 5. Stable positioning of bilateral nephrostomy tubes without hydronephrosis 6. Stable slightly dense exophytic lesion off the upper pole of right kidney 7. New focal area of hyperenhancement within the left posterior prostate gland, indeterminate for a prostate mass. 8. Diffuse skeletal metastatic disease. Interval enlargement of multiple lesions involving the spine and pelvis. Aortic Atherosclerosis (ICD10-I70.0). Electronically Signed   By: Donavan Foil M.D.   On: 04/12/2020 23:49   DG Chest [facility]  1 View  Result Date: 04/21/2020 CLINICAL DATA:  Increasing shortness of breath since last night. EXAM: PORTABLE CHEST 1 VIEW COMPARISON:  Radiograph 04/12/2020.  CT 02/02/2020 FINDINGS: Accessed right chest port in place. Post median sternotomy. Stable cardiomegaly and prosthetic aortic valve/root. There is new volume loss at the right lung base with ill-defined opacity. Minimal fluid in the right minor fissure versus adjacent atelectasis. The left lung is clear. There is no pulmonary edema. No pneumothorax. Blunting of the right costophrenic angle may be chronic or small effusion. IMPRESSION: 1. New volume loss at the  right lung base with ill-defined opacity. This may represent atelectasis, pneumonia or potentially aspiration in the appropriate clinical setting. Possible small right pleural effusion. 2. Stable cardiomegaly. Electronically Signed   By: Keith Rake M.D.   On: 04/21/2020 15:03   IR NEPHROSTOMY EXCHANGE LEFT  Result Date: 04/05/2020 CLINICAL DATA:  Metastatic urothelial cancer, status post percutaneous nephrostomy catheter placement 02/22/2020. Patient presents for his first scheduled nephrostomy exchange. No problems with the catheters thus far. EXAM: BILATERAL PERCUTANEOUS NEPHROSTOMY CATHETER EXCHANGE UNDER FLUOROSCOPY TECHNIQUE: The procedure, risks (including but not limited to bleeding, infection, organ damage ), benefits, and alternatives were explained to the patient. Questions regarding the procedure were encouraged and answered. The patient understands and consents to the procedure. The nephrostomy tubes and surrounding skin were prepped with Betadine, draped in usual sterile fashion. A small amount of contrast was injected through the right nephrostomy catheter to opacify the renal collecting system. The catheter was cut and exchanged over a 0.035" angiographic wire for a new 10-French pigtail catheter, formed centrally within the collecting system under fluoroscopy. Contrast injection confirms appropriate positioning. In a similar fashion, the left nephrostomy catheter was injected, cut, and exchanged for a new 10-French pigtail catheter, formed centrally within the left renal collecting system. Injection confirms appropriate positioning and patency. There is very little in the way of deposits or debris associated with the removed catheters. Both catheters were secured externally with 0 Prolene sutures. The patient tolerated the procedure well. FLUOROSCOPY TIME:  15 mGy; 1 minutes COMPLICATIONS: None immediate IMPRESSION: 1. Technically successful exchange of bilateral nephrostomy catheters under  fluoroscopy. Follow-up 8 weeks. Electronically Signed   By: Lucrezia Europe M.D.   On: 04/05/2020 15:47   IR NEPHROSTOMY EXCHANGE RIGHT  Result Date: 04/05/2020 CLINICAL DATA:  Metastatic urothelial cancer, status post percutaneous nephrostomy catheter placement 02/22/2020. Patient presents for his first scheduled nephrostomy exchange. No problems with the catheters thus far. EXAM: BILATERAL PERCUTANEOUS NEPHROSTOMY CATHETER EXCHANGE UNDER FLUOROSCOPY TECHNIQUE: The procedure, risks (including but not limited to bleeding, infection, organ damage ), benefits, and alternatives were explained to the patient. Questions regarding the procedure were encouraged and answered. The patient understands and consents to the procedure. The nephrostomy tubes and surrounding skin were prepped with Betadine, draped in usual sterile fashion. A small amount of contrast was injected through the right nephrostomy catheter to opacify the renal collecting system. The catheter was cut and exchanged over a 0.035" angiographic wire for a new 10-French pigtail catheter, formed centrally within the collecting system under fluoroscopy. Contrast injection confirms appropriate positioning. In a similar fashion, the left nephrostomy catheter was injected, cut, and exchanged for a new 10-French pigtail catheter, formed centrally within the left renal collecting system. Injection confirms appropriate positioning and patency. There is very little in the way of deposits or debris associated with the removed catheters. Both catheters were secured externally with 0 Prolene sutures. The patient tolerated the procedure  well. FLUOROSCOPY TIME:  15 mGy; 1 minutes COMPLICATIONS: None immediate IMPRESSION: 1. Technically successful exchange of bilateral nephrostomy catheters under fluoroscopy. Follow-up 8 weeks. Electronically Signed   By: Lucrezia Europe M.D.   On: 04/05/2020 15:47     CBC Recent Labs  Lab 04/21/20 1412 04/22/20 0429 04/23/20 0556  WBC  6.2 6.8 8.2  HGB 9.4* 8.9* 9.4*  HCT 29.2* 28.3* 29.3*  PLT 299 283 302  MCV 82.3 82.0 82.5  MCH 26.5 25.8* 26.5  MCHC 32.2 31.4 32.1  RDW 27.2* 27.0* 27.3*  LYMPHSABS 1.0  --   --   MONOABS 1.0  --   --   EOSABS 0.1  --   --   BASOSABS 0.0  --   --     Chemistries  Recent Labs  Lab 04/21/20 1412 04/22/20 0429 04/23/20 0556  NA 123* 126* 124*  K 5.0 4.0 4.1  CL 90* 91* 88*  CO2 23 25 24   GLUCOSE 104* 126* 91  BUN 27* 30* 30*  CREATININE 0.95 1.15 0.96  CALCIUM 7.8* 7.9* 8.0*  AST 31  --   --   ALT 51*  --   --   ALKPHOS 378*  --   --   BILITOT 0.5  --   --    ------------------------------------------------------------------------------------------------------------------ No results for input(s): CHOL, HDL, LDLCALC, TRIG, CHOLHDL, LDLDIRECT in the last 72 hours.  No results found for: HGBA1C ------------------------------------------------------------------------------------------------------------------ No results for input(s): TSH, T4TOTAL, T3FREE, THYROIDAB in the last 72 hours.  Invalid input(s): FREET3 ------------------------------------------------------------------------------------------------------------------ No results for input(s): VITAMINB12, FOLATE, FERRITIN, TIBC, IRON, RETICCTPCT in the last 72 hours.  Coagulation profile No results for input(s): INR, PROTIME in the last 168 hours.  No results for input(s): DDIMER in the last 72 hours.  Cardiac Enzymes No results for input(s): CKMB, TROPONINI, MYOGLOBIN in the last 168 hours.  Invalid input(s): CK ------------------------------------------------------------------------------------------------------------------    Component Value Date/Time   BNP >4,500.0 (H) 04/21/2020 1412   Roxan Hockey M.D on 04/23/2020 at 1:30 PM  Go to [email or web address] - for contact info  [facility] - Office  [phone]

## 2020-04-23 NOTE — Progress Notes (Addendum)
ANTICOAGULATION CONSULT NOTE -  Pharmacy Consult for Heparin Indication: pulmonary embolus  No Known Allergies  Patient Measurements: Height: 5\' 4"  (162.6 cm) Weight: 62.2 kg (137 lb 2 oz) IBW/kg (Calculated) : 59.2 HEPARIN DW (KG): 62.2  Vital Signs: Temp: 98.4 F (36.9 C) (08/03 0221) Temp Source: Oral (08/03 0221) BP: 123/70 (08/03 0221) Pulse Rate: 67 (08/03 0221)  Labs: Recent Labs    04/21/20 1412 04/21/20 1412 04/21/20 1630 04/22/20 0120 04/22/20 0429 04/23/20 0556  HGB 9.4*   < >  --   --  8.9* 9.4*  HCT 29.2*  --   --   --  28.3* 29.3*  PLT 299  --   --   --  283 302  HEPARINUNFRC  --   --   --  0.43 0.47 1.00*  CREATININE 0.95  --   --   --  1.15 0.96  TROPONINIHS 32*  --  32*  --   --   --    < > = values in this interval not displayed.    Estimated Creatinine Clearance: 61.7 mL/min (by C-G formula based on SCr of 0.96 mg/dL).   Medical History: Past Medical History:  Diagnosis Date  . Arterial embolus of lower extremity ([place]) 11/23/2013  . Arthritis   . CAD (coronary artery disease)    a. s/p PCI with 3.5 x 18 BMS to mid RCA, non obst LAD, Lcx patent 08/10/13  . Cancer ([place]) 2021   bladder  . Depression, major, single episode, mild ([place]) 12/20/2019  . ED (erectile dysfunction) 03/27/2015  . Embolus of femoral artery [facility]) 2012   Surgical repair, previously on Coumadin  . Essential hypertension   . ETOH abuse   . Hematuria 10/17/2019  . Hernia of abdominal cavity 02/27/2019  . History of DVT (deep vein thrombosis) 2011  . History of GI bleed    February 2012   . Ingrown left greater toenail 09/04/2018  . 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 Low oxygen saturation 01/31/2020  . 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 Pain with urination 10/17/2019  . Port-A-Cath in place 02/23/2020  . Prosthetic valve endocarditis ([place])   . PVD (peripheral vascular disease) ([place])   . S/P  aortic valve replacement    Details not clear -  reportedly 1971 per patient    Medications:  See med rec  Assessment: 69 y.o. male with a history of CAD with PCI, history of femoral artery embolus, also history of DVT, with recent diagnosis of kidney cancer currently with bilateral nephrostomy tubes, was discharged from the hospital 6 days ago. He returns today for worsening shortness of breath.  He states he has had a slowly progressive worsening of symptoms. ED MD is concerned with PE, pharmacy asked to start heparin. Reviewed home medications and is not currently on any oral anticoagulants.  HL supratherapeutic at 1.0  Goal of Therapy:  Heparin level 0.3-0.7 units/ml Monitor platelets by anticoagulation protocol: Yes   Plan:  Hold heparin x 1 hour. Decrease heparin infusion to 950 units/hr Check anti-Xa level in 6-8 hours and daily Continue to monitor H&H and platelets  Margot Ables, PharmD Clinical Pharmacist 04/23/2020 7:41 AM

## 2020-04-23 NOTE — Telephone Encounter (Signed)
noted 

## 2020-04-23 NOTE — Patient Outreach (Signed)
[facility]) Care Management  04/23/2020  [name] [birth year] [id number]   Member readmitted to hospital on 8/1.  Hospital liaisons notified.  Per chart, SNF has been recommended, will follow up with member pending disposition.  [name], [place], MSN [place] [phone]

## 2020-04-24 LAB — BASIC METABOLIC PANEL
Anion gap: 11 (ref 5–15)
BUN: 24 mg/dL — ABNORMAL HIGH (ref 8–23)
CO2: 25 mmol/L (ref 22–32)
Calcium: 8.2 mg/dL — ABNORMAL LOW (ref 8.9–10.3)
Chloride: 88 mmol/L — ABNORMAL LOW (ref 98–111)
Creatinine, Ser: 0.87 mg/dL (ref 0.61–1.24)
GFR calc Af Amer: >60 mL/min (ref >60)
GFR calc non Af Amer: >60 mL/min (ref >60)
Glucose, Bld: 84 mg/dL (ref 70–99)
Potassium: 4.9 mmol/L (ref 3.5–5.1)
Sodium: 124 mmol/L — ABNORMAL LOW (ref 135–145)

## 2020-04-24 LAB — CBC
HCT: 29.9 % — ABNORMAL LOW (ref 39.0–52.0)
Hemoglobin: 9.5 g/dL — ABNORMAL LOW (ref 13.0–17.0)
MCH: 26.5 pg (ref 26.0–34.0)
MCHC: 31.8 g/dL (ref 30.0–36.0)
MCV: 83.5 fL (ref 80.0–100.0)
Platelets: 314 10*3/uL (ref 150–400)
RBC: 3.58 MIL/uL — ABNORMAL LOW (ref 4.22–5.81)
RDW: 28 % — ABNORMAL HIGH (ref 11.5–15.5)
WBC: 8 10*3/uL (ref 4.0–10.5)
nRBC: 0 % (ref 0.0–0.2)

## 2020-04-24 LAB — HEPARIN LEVEL (UNFRACTIONATED): Heparin Unfractionated: 0.96 IU/mL — ABNORMAL HIGH (ref 0.30–0.70)

## 2020-04-24 MED ORDER — APIXABAN 5 MG PO TABS: 5.0000 mg | ORAL_TABLET | Freq: Two times a day (BID) | ORAL | Status: DC

## 2020-04-24 MED ORDER — APIXABAN 5 MG PO TABS
10.0000 mg | ORAL_TABLET | Freq: Two times a day (BID) | ORAL | Status: DC
  Administered 2020-04-24 – 2020-04-25 (×2): 10 mg via ORAL
  Filled 2020-04-24 (×2): qty 2

## 2020-04-24 NOTE — Progress Notes (Signed)
Patient Demographics:    [name], is a 69 y.o. male, DOB - 12-03-1950, MWN:[id number]  Admit date - 04/21/2020   Admitting Physician Neena Rhymes, MD  Outpatient Primary MD for the patient is [name], MD  LOS - 3   Chief Complaint  Patient presents with  . Shortness of Breath        Subjective:    [name] today has no fevers, no emesis,    -Denies any significant shortness of breath at rest, but dyspnea on exertion persist, -No bleeding concerns at this time -  Assessment  & Plan :    Principal Problem:   Pulmonary embolism (HCC) Active Problems:   Acute on chronic HFrEF (heart failure with reduced ejection fraction) -combined systolic and diastolic dysfunction CHF   Essential hypertension, benign   Metastatic urothelial carcinoma (HCC)   UTI (urinary tract infection)   Acute on chronic diastolic (congestive) heart failure (HCC)   Brief Summary: 69 y.o.malewith a history of CAD with PCI, history of femoral artery embolus, also history of DVT, with recent diagnosis of kidney cancer currently with bilateral nephrostomy tubes admitted on 04/22/2019 for bilateral PE after being recently discharged on 04/17/2020   A/p 1)Bilateral PE in the setting of underlying malignancy--- patient with prior history of DVT, -We will transition from IV heparin to Eliquis p.m. of 04/24/2020--- will most likely need lifelong anticoagulation -Await echocardiogram to evaluate for right heart strain May need prolonged IV heparin prior to transitioning to NOAC  2)HFrEF/ --patient with acute on chronic combined diastolic and systolic dysfunction CHF--- repeat echo from 04/23/2020 with EF down to 25 to 30%  from 45 to 50% on 01/05/2020 -Echo now demonstrates global hypokinesis of right heart strain and moderately elevated pulmonary artery pressures -c/n lasix, aldactone, and Lisinopril  3)--  Metatstatic Urothelial Carcinoma - new evidence of diffuse bony mets-----urothelial cancer with hydronephrosis 2/2 distal obstruction currently with bilateral nephrostomy tubes--- patient will need ongoing follow-up with IR and urology, as well as oncology, he was scheduled to get chemotherapy on 04/23/2020  4)Social/Ethics--patient is a Full code, palliative care consult requested  5)Recent urine culture with Acinetobacter and Enterococcus--- patient was treated in-house and then discharged on Augmentin which is being continued at this time  Repeat UA from 04/23/20 with Enterococcus faecalis sensitivities pending  6)Acute respiratory failure with hypoxia--- secondary to #1 above --Resolved, no further hypoxia at this time   7)S/p TAVR---aortic valve has been repaired/replaced. Aortic valve  regurgitation is trivial. There is a 26 mm Edwards Sapien prosthetic  (TAVR) valve present in the aortic position (patient has valve in valve  TAVR with previous bioprosthetic AVR). Aortic valve  mean gradient measures 8.0 mmHg.  There is a moderate paravalvular leak at 9 o'clock position.  -Outpatient follow-up with cardiology advised  Disposition/Need for in-Hospital Stay- patient unable to be discharged at this time due to --- bilateral PE requiring IV heparin, echo with severely reduced EF and right heart strain-patient remains symptomatic with dyspnea on exertion  Status is: Inpatient  Remains inpatient appropriate because:- bilateral PE requiring IV heparin  Disposition: The patient is from: Home              Anticipated d/c is to: SNF  Anticipated d/c date is: 2 days              Patient currently is not medically stable to d/c. Barriers: Not Clinically Stable- - bilateral PE requiring IV heparin - bilateral PE requiring IV heparin, echo with severely reduced EF and right heart strain-patient remains symptomatic with dyspnea on exertion   Code Status : full  Family Communication:     (patient is alert, awake and coherent)   Consults  :  palliative  DVT Prophylaxis  : IV heparin  Lab Results  Component Value Date   PLT 314 04/24/2020   Inpatient Medications  Scheduled Meds: . amoxicillin-clavulanate  1 tablet Oral Q12H  . apixaban  10 mg Oral BID   Followed by  . [START ON 05/01/2020] apixaban  5 mg Oral BID  . atorvastatin  40 mg Oral q1800  . Chlorhexidine Gluconate Cloth  6 each Topical Q0600  . clonazePAM  0.5 mg Oral QHS  . docusate sodium  100 mg Oral BID  . fluticasone  2 spray Each Nare Daily  . lisinopril  5 mg Oral Daily  . magnesium oxide  400 mg Oral BID  . metoprolol succinate  50 mg Oral Daily  . montelukast  10 mg Oral QHS  . pantoprazole  40 mg Oral QAC breakfast  . potassium chloride  10 mEq Oral Daily  . sodium chloride flush  3 mL Intravenous Q12H  . sodium chloride flush  3 mL Intravenous Q12H  . spironolactone  12.5 mg Oral Daily   Continuous Infusions: . sodium chloride    . sodium chloride    . heparin 850 Units/hr (04/24/20 1143)   PRN Meds:.sodium chloride, sodium chloride, acetaminophen, nitroGLYCERIN, ondansetron (ZOFRAN) IV, oxyCODONE, prochlorperazine, sodium chloride flush, sodium chloride flush   Anti-infectives (From admission, onward)   Start     Dose/Rate Route Frequency Ordered Stop   04/21/20 2200  amoxicillin-clavulanate (AUGMENTIN) 875-125 MG per tablet 1 tablet     Discontinue     1 tablet Oral Every 12 hours 04/21/20 2026          Objective:   Vitals:   04/23/20 1300 04/23/20 2143 04/24/20 0540 04/24/20 1350  BP: 109/67 109/82 115/70 114/76  Pulse: 75 71 70 68  Resp: 19 20 20 16   Temp: 98.5 F (36.9 C) 98.2 F (36.8 C) 98.2 F (36.8 C) 97.8 F (36.6 C)  TempSrc: Oral Oral Oral Oral  SpO2: 99% 99% 97% 99%  Weight:      Height:        Wt Readings from Last 3 Encounters:  04/22/20 62.2 kg  04/17/20 60.3 kg  04/04/20 54.8 kg    Intake/Output Summary (Last 24 hours) at 04/24/2020 1720 Last  data filed at 04/24/2020 0500 Gross per 24 hour  Intake --  Output 775 ml  Net -775 ml   Physical Exam  Gen:- Awake Alert, no apparent distress, some DOE HEENT:- [place].AT, No sclera icterus Neck-Supple Neck,No JVD,.  Lungs- diminished in bases with scattered rales CV- S1, S2 normal, regular  Abd-  +ve B.Sounds, Abd Soft, No tenderness, bilateral Nephrostomy tubes Extremity/Skin:- No  edema, pedal pulses present  Psych-affect is appropriate, oriented x3 Neuro-Generalized weakness, no new focal deficits, no tremors   Data Review:   Micro Results Recent Results (from the past 240 hour(s))  SARS Coronavirus 2 by RT PCR (hospital order, performed in [facility] hospital lab) Nasopharyngeal Nasopharyngeal Swab     Status: None   Collection  Time: 04/21/20  8:41 PM   Specimen: Nasopharyngeal Swab  Result Value Ref Range Status   SARS Coronavirus 2 NEGATIVE NEGATIVE Final    Comment: (NOTE) SARS-CoV-2 target nucleic acids are NOT DETECTED.  The SARS-CoV-2 RNA is generally detectable in upper and lower respiratory specimens during the acute phase of infection. The lowest concentration of SARS-CoV-2 viral copies this assay can detect is 250 copies / mL. A negative result does not preclude SARS-CoV-2 infection and should not be used as the sole basis for treatment or other patient management decisions.  A negative result may occur with improper specimen collection / handling, submission of specimen other than nasopharyngeal swab, presence of viral mutation(s) within the areas targeted by this assay, and inadequate number of viral copies (<250 copies / mL). A negative result must be combined with clinical observations, patient history, and epidemiological information.  Fact Sheet for Patients:   StrictlyIdeas.no  Fact Sheet for Healthcare Providers: BankingDealers.co.za  This test is not yet approved or  cleared by the [place] FDA  and has been authorized for detection and/or diagnosis of SARS-CoV-2 by FDA under an Emergency Use Authorization (EUA).  This EUA will remain in effect (meaning this test can be used) for the duration of the COVID-19 declaration under Section 564(b)(1) of the Act, 21 U.S.C. section 360bbb-3(b)(1), unless the authorization is terminated or revoked sooner.  Performed at [facility], [address], [place], [place] [address]   Urine Culture     Status: Abnormal (Preliminary result)   Collection Time: 04/23/20  6:30 AM   Specimen: Urine, Clean Catch  Result Value Ref Range Status   Specimen Description   Final    URINE, CLEAN CATCH Performed at [facility], [address] [place], [place] [address]    Special Requests   Final    Immunocompromised Performed at [facility], [address], [place], [place] [address]    Culture (A)  Final    60,000 COLONIES/mL ENTEROCOCCUS FAECALIS SUSCEPTIBILITIES TO FOLLOW Performed at [facility], [place] [address] [place], [place] [address]    Report Status PENDING  Incomplete    Radiology Reports DG Chest 2 View  Result Date: 04/12/2020 CLINICAL DATA:  Short of breath EXAM: CHEST - 2 VIEW COMPARISON:  03/06/2020, 02/12/2020 FINDINGS: Right-sided central venous port tip over the SVC. Post sternotomy changes with valve prostheses. Cardiomegaly. Small right greater than left pleural effusion. Scattered foci of sclerosis at the ribs and spine consistent with metastatic disease. Incompletely visualized pigtail catheter in the right upper quadrant. IMPRESSION: Cardiomegaly with small right greater than left pleural effusions. Scattered sclerotic foci overlying the ribs and spine, likely corresponding to known skeletal metastatic disease. Electronically Signed   By: Donavan Foil M.D.   On: 04/12/2020 21:47   CT Angio Chest PE W and/or Wo Contrast  Result Date: 04/21/2020 CLINICAL DATA:  Increasing shortness of breath, history of bladder cancer, DVT  EXAM: CT ANGIOGRAPHY CHEST WITH CONTRAST TECHNIQUE: Multidetector CT imaging of the chest was performed using the standard protocol during bolus administration of intravenous contrast. Multiplanar CT image reconstructions and MIPs were obtained to evaluate the vascular anatomy. CONTRAST:  17mL OMNIPAQUE IOHEXOL 350 MG/ML SOLN COMPARISON:  CT 02/02/2020 FINDINGS: Cardiovascular: Satisfactory opacification of the pulmonary arteries to the segmental level. Suspect filling defects within the segmental branches of the posterior basal segment right lower lobe (5/190) though difficult to fully assess given hypoventilatory changes/passive atelectasis in this region as well as some respiratory motion  artifact. No other convincing pulmonary arterial filling defects additional peripherally marginated segmental filling defects seen in the anterior segmental branches left upper lobe (5/79) and lateral basal segmental branch of the left lower lobe (5/172). Central pulmonary arteries remain normal caliber. Diffuse cardiomegaly with biatrial enlargement and reflux of contrast into the IVC and hepatic veins. Transcatheter aortic valve replacement is in position. Coronary artery calcifications are present. Rib right IJ Port-A-Cath tip terminates at the level of the superior cavoatrial junction. No other major venous abnormalities. Mediastinum/Nodes: Postsurgical changes from prior sternotomy. No mediastinal fluid or gas. Normal thyroid gland and thoracic inlet. No acute abnormality of the trachea or esophagus. No worrisome mediastinal, hilar or axillary adenopathy. Lungs/Pleura: Moderate right pleural effusion with subtotal atelectatic collapse of the right lower lobe with further subsegmental atelectatic changes in the right upper and middle lobes as well. Stable biapical pleuroparenchymal scarring and blebs with calcified right apical granuloma. Upper Abdomen: Coil pack seen in the central liver. Bilateral percutaneous  nephrostomies. Small volume upper abdominal ascites. Musculoskeletal: Diffuse body wall edema. New extensive multifocal areas of sclerotic osseous metastatic disease throughout the axial skeleton, sternum and ribs as well as the bilateral scapula and right humerus. No acute fracture. Postsurgical changes from prior sternotomy with intact sternal sutures and bony fusion across the sternum. No worrisome chest wall lesions. Review of the MIP images confirms the above findings. IMPRESSION: 1. Segmental and subsegmental filling defects in the posterior basal segment right lower lobe, anterior segment left upper lobe and basal segmental left lower lobe. 2. Cardiomegaly with biatrial enlargement and reflux of contrast into the IVC and hepatic veins, suggestive of elevated right heart pressure/right heart failure. 3. Moderate right pleural effusion with subtotal atelectatic collapse of the right lower lobe with further subsegmental atelectatic changes in the right upper and middle lobes as well. Possibly related to volume status though malignant effusion is not excluded. 4. Additional features of anasarca with circumferential body wall edema and upper abdominal ascites. 5. Extensive new multifocal areas of sclerotic osseous metastatic disease. 6. Prior transcatheter aortic valve replacement as well as sternotomy. 7. Coronary artery atherosclerosis. 8. Bilateral percutaneous nephrostomies. 9. Aortic Atherosclerosis (ICD10-I70.0). Electronically Signed   By: Lovena Le M.D.   On: 04/21/2020 16:18   CT ABDOMEN PELVIS W CONTRAST  Result Date: 04/12/2020 CLINICAL DATA:  Abdomen pain possible biliary obstruction bilateral leg swelling EXAM: CT ABDOMEN AND PELVIS WITH CONTRAST TECHNIQUE: Multidetector CT imaging of the abdomen and pelvis was performed using the standard protocol following bolus administration of intravenous contrast. CONTRAST:  18mL OMNIPAQUE IOHEXOL 300 MG/ML  SOLN COMPARISON:  CT 03/06/2020, 02/02/2020  FINDINGS: Lower chest: Lung bases demonstrate new small moderate right pleural effusion. Marked cardiomegaly with multi chamber enlargement. Hepatobiliary: Mild loculated fluid along the anterior aspect of the liver. No focal hepatic abnormality. No calcified gallstone or biliary dilatation. Embolization coils at the porta hepatis. Pancreas: Unremarkable. No pancreatic ductal dilatation or surrounding inflammatory changes. Spleen: Unchanged lobulated spleen with coarse calcification. Adrenals/Urinary Tract: Adrenal glands are within normal limits. Bilateral nephrostomy tubes remain in place. No significant hydronephrosis. Cortical scarring within both kidneys. Slightly dense exophytic lesion off the upper pole right kidney measuring 2.3 x 1.4 cm, previously 2.3 x 1.5 cm. Eccentric right bladder mass measures slightly smaller today, 7 cm AP x 5 cm transverse compared with 8.4 x 4.5 cm previously. Possible right seminal vesicle tumor invasion as before. Stomach/Bowel: The stomach is decompressed. No dilated small bowel. No bowel wall thickening. Vascular/Lymphatic: Tortuous  aorta with atherosclerosis. No aneurysm. Decreased retroperitoneal and pelvic adenopathy. For example left Peri aortic lymph node measures 8 mm compared with 11 mm previously, series 2, image number 19. Right external iliac node now measures 17 mm, compared with 3.7 cm previously. Reproductive: Prostate enlarged. 2.4 cm relative area of hyperenhancement within the left posterior gland. Other: No free air. Extensive subcutaneous edema consistent with anasarca. Large bilateral scrotal fluid collections. Musculoskeletal: Multiple sclerotic foci within the pelvic bones, spine and ribs consistent with osseous metastatic disease. Some of the lesions appear increased in size with more dense sclerosis, for example left inferior pubic ramus lesion measures 22 mm compared with 20 mm previously. Increased size of sclerotic lesions within the lumbar vertebra.  IMPRESSION: 1. Negative for calcified gallstone or biliary dilatation. 2. Extensive subcutaneous edema consistent with generalized anasarca with large bilateral scrotal fluid collections. Cardiomegaly with small moderate right pleural effusion. 3. Slight interval decrease in size of eccentric right bladder mass. Decreased retroperitoneal and pelvic adenopathy. New small amount loculated right perihepatic fluid. 4. Slight interval decrease in size of eccentric right bladder mass with possible invasion of right seminal vesicle. Decreased retroperitoneal and pelvic adenopathy compared to most recent prior. 5. Stable positioning of bilateral nephrostomy tubes without hydronephrosis 6. Stable slightly dense exophytic lesion off the upper pole of right kidney 7. New focal area of hyperenhancement within the left posterior prostate gland, indeterminate for a prostate mass. 8. Diffuse skeletal metastatic disease. Interval enlargement of multiple lesions involving the spine and pelvis. Aortic Atherosclerosis (ICD10-I70.0). Electronically Signed   By: Donavan Foil M.D.   On: 04/12/2020 23:49   DG Chest Port 1 View  Result Date: 04/21/2020 CLINICAL DATA:  Increasing shortness of breath since last night. EXAM: PORTABLE CHEST 1 VIEW COMPARISON:  Radiograph 04/12/2020.  CT 02/02/2020 FINDINGS: Accessed right chest port in place. Post median sternotomy. Stable cardiomegaly and prosthetic aortic valve/root. There is new volume loss at the right lung base with ill-defined opacity. Minimal fluid in the right minor fissure versus adjacent atelectasis. The left lung is clear. There is no pulmonary edema. No pneumothorax. Blunting of the right costophrenic angle may be chronic or small effusion. IMPRESSION: 1. New volume loss at the right lung base with ill-defined opacity. This may represent atelectasis, pneumonia or potentially aspiration in the appropriate clinical setting. Possible small right pleural effusion. 2. Stable  cardiomegaly. Electronically Signed   By: Keith Rake M.D.   On: 04/21/2020 15:03   ECHOCARDIOGRAM COMPLETE  Result Date: 04/23/2020    ECHOCARDIOGRAM REPORT   Patient Name:   TYRIEK HOFMAN Date of Exam: 04/23/2020 Medical Rec #:  [id number]        Height:       64.0 in Accession #:    [id number]       Weight:       137.1 lb Date of Birth:  [birth year]        BSA:          1.666 m Patient Age:    6 years         BP:           109/67 mmHg Patient Gender: M                HR:           75 bpm. Exam Location:  [facility] Procedure: 2D Echo, Cardiac Doppler and Color Doppler Indications:    Pulmonary Embolus 415.19 / I26.99  History:  Patient has prior history of Echocardiogram examinations, most                 recent 01/05/2020. CHF; Risk Factors:Hypertension. History of DVT                 (deep vein thrombosis) (Resolved), Alcohol abuse (Resolved), HX                 OF AORTIC VALVE REPLACEMENT, Cancer from history.                 Aortic Valve: 26 mm Edwards Sapien prosthetic, stented (TAVR)                 valve is present in the aortic position.  Sonographer:    Alvino Chapel RCS Referring Phys: [phone] Barack Nicodemus IMPRESSIONS  1. Left ventricular ejection fraction, by estimation, is 25 to 30%. The left ventricle has severely decreased function. The left ventricle demonstrates global hypokinesis. The left ventricular internal cavity size was moderately dilated. There is mild left ventricular hypertrophy. Left ventricular diastolic parameters are indeterminate.  2. Right ventricular systolic function is severely reduced. The right ventricular size is mildly enlarged. There is moderately elevated pulmonary artery systolic pressure. The estimated right ventricular systolic pressure is 62.2 mmHg.  3. Left atrial size was severely dilated.  4. Right atrial size was severely dilated.  5. The mitral valve is abnormal. Mild to moderate mitral valve regurgitation.  6. The aortic valve has been  repaired/replaced. Aortic valve regurgitation is trivial. There is a 26 mm Edwards Sapien prosthetic (TAVR) valve present in the aortic position (patient has valve in valve TAVR with previous bioprosthetic AVR). Aortic valve  mean gradient measures 8.0 mmHg. There is a moderate paravalvular leak at 9 o'clock position.  7. The inferior vena cava is dilated in size with <50% respiratory variability, suggesting right atrial pressure of 15 mmHg. FINDINGS  Left Ventricle: Left ventricular ejection fraction, by estimation, is 25 to 30%. The left ventricle has severely decreased function. The left ventricle demonstrates global hypokinesis. The left ventricular internal cavity size was moderately dilated. There is mild left ventricular hypertrophy. Left ventricular diastolic parameters are indeterminate. Right Ventricle: The right ventricular size is mildly enlarged. No increase in right ventricular wall thickness. Right ventricular systolic function is severely reduced. There is moderately elevated pulmonary artery systolic pressure. The tricuspid regurgitant velocity is 2.89 m/s, and with an assumed right atrial pressure of 15 mmHg, the estimated right ventricular systolic pressure is 29.7 mmHg. Left Atrium: Left atrial size was severely dilated. Right Atrium: Right atrial size was severely dilated. Pericardium: There is no evidence of pericardial effusion. Mitral Valve: The mitral valve is abnormal. There is moderate thickening of the mitral valve leaflet(s). Mild to moderate mitral valve regurgitation. Tricuspid Valve: The tricuspid valve is grossly normal. Tricuspid valve regurgitation is mild. Aortic Valve: The aortic valve has been repaired/replaced. Aortic valve regurgitation is trivial. Aortic regurgitation PHT measures 354 msec. Aortic valve mean gradient measures 8.0 mmHg. Aortic valve peak gradient measures 16.5 mmHg. Aortic valve area, by VTI measures 1.48 cm. There is a 26 mm Edwards Sapien prosthetic,  stented (TAVR) valve present in the aortic position. Pulmonic Valve: The pulmonic valve was grossly normal. Pulmonic valve regurgitation is mild to moderate. Aorta: The aortic root is normal in size and structure. Venous: The inferior vena cava is dilated in size with less than 50% respiratory variability, suggesting right atrial pressure of 15 mmHg. IAS/Shunts: No atrial level  shunt detected by color flow Doppler.  LEFT VENTRICLE PLAX 2D LVIDd:         6.63 cm      Diastology LVIDs:         5.69 cm      LV e' lateral:   11.00 cm/s LV PW:         1.24 cm      LV E/e' lateral: 9.7 LV IVS:        1.20 cm LVOT diam:     2.00 cm LV SV:         57 LV SV Index:   34 LVOT Area:     3.14 cm  LV Volumes (MOD) LV vol d, MOD A2C: 273.0 ml LV vol d, MOD A4C: 235.0 ml LV vol s, MOD A2C: 175.0 ml LV vol s, MOD A4C: 166.0 ml LV SV MOD A2C:     98.0 ml LV SV MOD A4C:     235.0 ml LV SV MOD BP:      84.4 ml RIGHT VENTRICLE RV S prime:     7.84 cm/s TAPSE (M-mode): 1.5 cm LEFT ATRIUM              Index        RIGHT ATRIUM           Index LA diam:        4.40 cm  2.64 cm/m   RA Area:     31.80 cm LA Vol (A2C):   176.0 ml 105.61 ml/m RA Volume:   123.00 ml 73.81 ml/m LA Vol (A4C):   111.0 ml 66.61 ml/m LA Biplane Vol: 144.0 ml 86.41 ml/m  AORTIC VALVE AV Area (Vmax):    1.56 cm AV Area (Vmean):   1.61 cm AV Area (VTI):     1.48 cm AV Vmax:           203.00 cm/s AV Vmean:          127.000 cm/s AV VTI:            0.389 m AV Peak Grad:      16.5 mmHg AV Mean Grad:      8.0 mmHg LVOT Vmax:         101.00 cm/s LVOT Vmean:        64.900 cm/s LVOT VTI:          0.183 m LVOT/AV VTI ratio: 0.47 AI PHT:            354 msec  AORTA Ao Root diam: 3.30 cm MITRAL VALVE                 TRICUSPID VALVE MV Area (PHT): 4.89 cm      TR Peak grad:   33.4 mmHg MV Decel Time: 155 msec      TR Vmax:        289.00 cm/s MR Peak grad:    90.6 mmHg MR Mean grad:    50.0 mmHg   SHUNTS MR Vmax:         476.00 cm/s Systemic VTI:  0.18 m MR Vmean:         318.0 cm/s  Systemic Diam: 2.00 cm MR PISA:         1.57 cm MR PISA Eff ROA: 11 mm MR PISA Radius:  0.50 cm MV E velocity: 107.00 cm/s Rozann Lesches MD Electronically signed by Rozann Lesches MD Signature Date/Time: 04/23/2020/5:43:09 PM    Final    IR NEPHROSTOMY  EXCHANGE LEFT  Result Date: 04/05/2020 CLINICAL DATA:  Metastatic urothelial cancer, status post percutaneous nephrostomy catheter placement 02/22/2020. Patient presents for his first scheduled nephrostomy exchange. No problems with the catheters thus far. EXAM: BILATERAL PERCUTANEOUS NEPHROSTOMY CATHETER EXCHANGE UNDER FLUOROSCOPY TECHNIQUE: The procedure, risks (including but not limited to bleeding, infection, organ damage ), benefits, and alternatives were explained to the patient. Questions regarding the procedure were encouraged and answered. The patient understands and consents to the procedure. The nephrostomy tubes and surrounding skin were prepped with Betadine, draped in usual sterile fashion. A small amount of contrast was injected through the right nephrostomy catheter to opacify the renal collecting system. The catheter was cut and exchanged over a 0.035" angiographic wire for a new 10-French pigtail catheter, formed centrally within the collecting system under fluoroscopy. Contrast injection confirms appropriate positioning. In a similar fashion, the left nephrostomy catheter was injected, cut, and exchanged for a new 10-French pigtail catheter, formed centrally within the left renal collecting system. Injection confirms appropriate positioning and patency. There is very little in the way of deposits or debris associated with the removed catheters. Both catheters were secured externally with 0 Prolene sutures. The patient tolerated the procedure well. FLUOROSCOPY TIME:  15 mGy; 1 minutes COMPLICATIONS: None immediate IMPRESSION: 1. Technically successful exchange of bilateral nephrostomy catheters under fluoroscopy. Follow-up 8 weeks.  Electronically Signed   By: Lucrezia Europe M.D.   On: 04/05/2020 15:47   IR NEPHROSTOMY EXCHANGE RIGHT  Result Date: 04/05/2020 CLINICAL DATA:  Metastatic urothelial cancer, status post percutaneous nephrostomy catheter placement 02/22/2020. Patient presents for his first scheduled nephrostomy exchange. No problems with the catheters thus far. EXAM: BILATERAL PERCUTANEOUS NEPHROSTOMY CATHETER EXCHANGE UNDER FLUOROSCOPY TECHNIQUE: The procedure, risks (including but not limited to bleeding, infection, organ damage ), benefits, and alternatives were explained to the patient. Questions regarding the procedure were encouraged and answered. The patient understands and consents to the procedure. The nephrostomy tubes and surrounding skin were prepped with Betadine, draped in usual sterile fashion. A small amount of contrast was injected through the right nephrostomy catheter to opacify the renal collecting system. The catheter was cut and exchanged over a 0.035" angiographic wire for a new 10-French pigtail catheter, formed centrally within the collecting system under fluoroscopy. Contrast injection confirms appropriate positioning. In a similar fashion, the left nephrostomy catheter was injected, cut, and exchanged for a new 10-French pigtail catheter, formed centrally within the left renal collecting system. Injection confirms appropriate positioning and patency. There is very little in the way of deposits or debris associated with the removed catheters. Both catheters were secured externally with 0 Prolene sutures. The patient tolerated the procedure well. FLUOROSCOPY TIME:  15 mGy; 1 minutes COMPLICATIONS: None immediate IMPRESSION: 1. Technically successful exchange of bilateral nephrostomy catheters under fluoroscopy. Follow-up 8 weeks. Electronically Signed   By: Lucrezia Europe M.D.   On: 04/05/2020 15:47     CBC Recent Labs  Lab 04/21/20 1412 04/22/20 0429 04/23/20 0556 04/24/20 0549  WBC 6.2 6.8 8.2 8.0    HGB 9.4* 8.9* 9.4* 9.5*  HCT 29.2* 28.3* 29.3* 29.9*  PLT 299 283 302 314  MCV 82.3 82.0 82.5 83.5  MCH 26.5 25.8* 26.5 26.5  MCHC 32.2 31.4 32.1 31.8  RDW 27.2* 27.0* 27.3* 28.0*  LYMPHSABS 1.0  --   --   --   MONOABS 1.0  --   --   --   EOSABS 0.1  --   --   --   BASOSABS  0.0  --   --   --     Chemistries  Recent Labs  Lab 04/21/20 1412 04/22/20 0429 04/23/20 0556 04/24/20 0549  NA 123* 126* 124* 124*  K 5.0 4.0 4.1 4.9  CL 90* 91* 88* 88*  CO2 23 25 24 25   GLUCOSE 104* 126* 91 84  BUN 27* 30* 30* 24*  CREATININE 0.95 1.15 0.96 0.87  CALCIUM 7.8* 7.9* 8.0* 8.2*  AST 31  --   --   --   ALT 51*  --   --   --   ALKPHOS 378*  --   --   --   BILITOT 0.5  --   --   --    ------------------------------------------------------------------------------------------------------------------ No results for input(s): CHOL, HDL, LDLCALC, TRIG, CHOLHDL, LDLDIRECT in the last 72 hours.  No results found for: HGBA1C ------------------------------------------------------------------------------------------------------------------ No results for input(s): TSH, T4TOTAL, T3FREE, THYROIDAB in the last 72 hours.  Invalid input(s): FREET3 ------------------------------------------------------------------------------------------------------------------ No results for input(s): VITAMINB12, FOLATE, FERRITIN, TIBC, IRON, RETICCTPCT in the last 72 hours.  Coagulation profile No results for input(s): INR, PROTIME in the last 168 hours.  No results for input(s): DDIMER in the last 72 hours.  Cardiac Enzymes No results for input(s): CKMB, TROPONINI, MYOGLOBIN in the last 168 hours.  Invalid input(s): CK ------------------------------------------------------------------------------------------------------------------    Component Value Date/Time   BNP >4,500.0 (H) 04/21/2020 1412   Roxan Hockey M.D on 04/24/2020 at 5:20 PM  Go to [email or web address] - for contact info  [facility] -  Office  [phone]

## 2020-04-24 NOTE — Progress Notes (Signed)
Pt noted to awake vomiting thick clear substance. States this has happened several times at home in the past when he eats heavy before bed. Administered Zofran as ordered. Advised on sleeping with bed in higher position to prevent aspiration but pt refused, stating that it makes his back hurt to sleep that way, he has to sleep flat.

## 2020-04-24 NOTE — Progress Notes (Addendum)
ANTICOAGULATION CONSULT NOTE -  Pharmacy Consult for Heparin--> eliquis Indication: pulmonary embolus  No Known Allergies  Patient Measurements: Height: 5\' 4"  (162.6 cm) Weight: 62.2 kg (137 lb 2 oz) IBW/kg (Calculated) : 59.2 HEPARIN DW (KG): 62.2  Vital Signs: Temp: 98.2 F (36.8 C) (08/04 0540) Temp Source: Oral (08/04 0540) BP: 115/70 (08/04 0540) Pulse Rate: 70 (08/04 0540)  Labs: Recent Labs     0000 04/21/20 1412 04/21/20 1630 04/22/20 0120 04/22/20 0429 04/22/20 0429 04/23/20 0556 04/23/20 0556 04/23/20 1534 04/23/20 2202 04/24/20 0549  HGB   < > 9.4*  --   --  8.9*   < > 9.4*  --   --   --  9.5*  HCT   < > 29.2*  --   --  28.3*  --  29.3*  --   --   --  29.9*  PLT   < > 299  --   --  283  --  302  --   --   --  314  HEPARINUNFRC  --   --   --    < > 0.47   < > 1.00*   < > 0.44 0.36 0.96*  CREATININE   < > 0.95  --   --  1.15  --  0.96  --   --   --  0.87  TROPONINIHS  --  32* 32*  --   --   --   --   --   --   --   --    < > = values in this interval not displayed.    Estimated Creatinine Clearance: 68 mL/min (by C-G formula based on SCr of 0.87 mg/dL).   Medical History: Past Medical History:  Diagnosis Date  . Arterial embolus of lower extremity ([place]) 11/23/2013  . Arthritis   . CAD (coronary artery disease)    a. s/p PCI with 3.5 x 18 BMS to mid RCA, non obst LAD, Lcx patent 08/10/13  . Cancer ([place]) 2021   bladder  . Depression, major, single episode, mild ([place]) 12/20/2019  . ED (erectile dysfunction) 03/27/2015  . Embolus of femoral artery [facility]) 2012   Surgical repair, previously on Coumadin  . Essential hypertension   . ETOH abuse   . Hematuria 10/17/2019  . Hernia of abdominal cavity 02/27/2019  . History of DVT (deep vein thrombosis) 2011  . History of GI bleed    February 2012   . Ingrown left greater toenail 09/04/2018  . 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 Low oxygen saturation 01/31/2020  . 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 Pain with urination 10/17/2019  . Port-A-Cath in place 02/23/2020  . Prosthetic valve endocarditis ([place])   . PVD (peripheral vascular disease) ([place])   . S/P aortic valve replacement    Details not clear -  reportedly 1971 per patient    Medications:  See med rec  Assessment: 69 y.o. male with a history of CAD with PCI, history of femoral artery embolus, also history of DVT, with recent diagnosis of kidney cancer currently with bilateral nephrostomy tubes, was discharged from the hospital 6 days ago. He returns today for worsening shortness of breath.  He states he has had a slowly progressive worsening of symptoms. ED MD is concerned with PE, pharmacy asked to start heparin. Reviewed home medications and is not currently on any  oral anticoagulants.  HL supratherapeutic this aM at , will now switch to eliquis this evening  Goal of Therapy:  Heparin level 0.3-0.7 units/ml Monitor platelets by anticoagulation protocol: Yes   Plan:  Decrease heparin to 850 units/hr, then D/c Heparin at 2200 Start eliquis 10mg  po BIDx 7 days, then 5mg  po bid Educate on eliquis Continue to monitor H&H and platelets  Isac Sarna, BS [place], BCPS Clinical Pharmacist Pager [phone] 04/24/2020 10:41 AM

## 2020-04-24 NOTE — Care Management Important Message (Signed)
Important Message  Patient Details  Name: [name] MRN: [id number] Date of Birth: 1951/09/11   Medicare Important Message Given:  Yes     [name] 04/24/2020, 4:08 PM

## 2020-04-25 ENCOUNTER — Other Ambulatory Visit (HOSPITAL_COMMUNITY): Payer: Medicare Other

## 2020-04-25 ENCOUNTER — Ambulatory Visit (HOSPITAL_COMMUNITY): Payer: Medicare Other

## 2020-04-25 ENCOUNTER — Ambulatory Visit (HOSPITAL_COMMUNITY): Payer: Medicare Other | Admitting: Hematology

## 2020-04-25 DIAGNOSIS — A419 Sepsis, unspecified organism: Secondary | ICD-10-CM | POA: Diagnosis not present

## 2020-04-25 DIAGNOSIS — Z66 Do not resuscitate: Secondary | ICD-10-CM

## 2020-04-25 DIAGNOSIS — I517 Cardiomegaly: Secondary | ICD-10-CM | POA: Diagnosis not present

## 2020-04-25 DIAGNOSIS — I2510 Atherosclerotic heart disease of native coronary artery without angina pectoris: Secondary | ICD-10-CM | POA: Diagnosis not present

## 2020-04-25 DIAGNOSIS — C7910 Secondary malignant neoplasm of unspecified urinary organs: Secondary | ICD-10-CM | POA: Diagnosis not present

## 2020-04-25 DIAGNOSIS — I110 Hypertensive heart disease with heart failure: Secondary | ICD-10-CM | POA: Diagnosis not present

## 2020-04-25 DIAGNOSIS — Z09 Encounter for follow-up examination after completed treatment for conditions other than malignant neoplasm: Secondary | ICD-10-CM | POA: Diagnosis not present

## 2020-04-25 DIAGNOSIS — C7951 Secondary malignant neoplasm of bone: Secondary | ICD-10-CM | POA: Diagnosis not present

## 2020-04-25 DIAGNOSIS — E876 Hypokalemia: Secondary | ICD-10-CM | POA: Diagnosis not present

## 2020-04-25 DIAGNOSIS — R531 Weakness: Secondary | ICD-10-CM | POA: Diagnosis not present

## 2020-04-25 DIAGNOSIS — I509 Heart failure, unspecified: Secondary | ICD-10-CM | POA: Diagnosis not present

## 2020-04-25 DIAGNOSIS — R0602 Shortness of breath: Secondary | ICD-10-CM | POA: Diagnosis not present

## 2020-04-25 DIAGNOSIS — Z79899 Other long term (current) drug therapy: Secondary | ICD-10-CM | POA: Diagnosis not present

## 2020-04-25 DIAGNOSIS — R200 Anesthesia of skin: Secondary | ICD-10-CM | POA: Diagnosis not present

## 2020-04-25 DIAGNOSIS — J309 Allergic rhinitis, unspecified: Secondary | ICD-10-CM | POA: Diagnosis not present

## 2020-04-25 DIAGNOSIS — Z833 Family history of diabetes mellitus: Secondary | ICD-10-CM | POA: Diagnosis not present

## 2020-04-25 DIAGNOSIS — R112 Nausea with vomiting, unspecified: Secondary | ICD-10-CM | POA: Diagnosis not present

## 2020-04-25 DIAGNOSIS — I2694 Multiple subsegmental pulmonary emboli without acute cor pulmonale: Secondary | ICD-10-CM | POA: Diagnosis not present

## 2020-04-25 DIAGNOSIS — I2699 Other pulmonary embolism without acute cor pulmonale: Secondary | ICD-10-CM | POA: Diagnosis not present

## 2020-04-25 DIAGNOSIS — Z743 Need for continuous supervision: Secondary | ICD-10-CM | POA: Diagnosis not present

## 2020-04-25 DIAGNOSIS — R002 Palpitations: Secondary | ICD-10-CM | POA: Diagnosis not present

## 2020-04-25 DIAGNOSIS — Z952 Presence of prosthetic heart valve: Secondary | ICD-10-CM | POA: Diagnosis not present

## 2020-04-25 DIAGNOSIS — Z836 Family history of other diseases of the respiratory system: Secondary | ICD-10-CM | POA: Diagnosis not present

## 2020-04-25 DIAGNOSIS — M2550 Pain in unspecified joint: Secondary | ICD-10-CM | POA: Diagnosis not present

## 2020-04-25 DIAGNOSIS — N1330 Unspecified hydronephrosis: Secondary | ICD-10-CM | POA: Diagnosis not present

## 2020-04-25 DIAGNOSIS — I5023 Acute on chronic systolic (congestive) heart failure: Secondary | ICD-10-CM | POA: Diagnosis not present

## 2020-04-25 DIAGNOSIS — C689 Malignant neoplasm of urinary organ, unspecified: Secondary | ICD-10-CM | POA: Diagnosis not present

## 2020-04-25 DIAGNOSIS — G479 Sleep disorder, unspecified: Secondary | ICD-10-CM | POA: Diagnosis not present

## 2020-04-25 DIAGNOSIS — B952 Enterococcus as the cause of diseases classified elsewhere: Secondary | ICD-10-CM | POA: Diagnosis not present

## 2020-04-25 DIAGNOSIS — Z436 Encounter for attention to other artificial openings of urinary tract: Secondary | ICD-10-CM | POA: Diagnosis not present

## 2020-04-25 DIAGNOSIS — C678 Malignant neoplasm of overlapping sites of bladder: Secondary | ICD-10-CM | POA: Diagnosis not present

## 2020-04-25 DIAGNOSIS — J90 Pleural effusion, not elsewhere classified: Secondary | ICD-10-CM | POA: Diagnosis not present

## 2020-04-25 DIAGNOSIS — C649 Malignant neoplasm of unspecified kidney, except renal pelvis: Secondary | ICD-10-CM | POA: Diagnosis not present

## 2020-04-25 DIAGNOSIS — C679 Malignant neoplasm of bladder, unspecified: Secondary | ICD-10-CM | POA: Diagnosis not present

## 2020-04-25 DIAGNOSIS — C7919 Secondary malignant neoplasm of other urinary organs: Secondary | ICD-10-CM | POA: Diagnosis not present

## 2020-04-25 DIAGNOSIS — I739 Peripheral vascular disease, unspecified: Secondary | ICD-10-CM | POA: Diagnosis not present

## 2020-04-25 DIAGNOSIS — F410 Panic disorder [episodic paroxysmal anxiety] without agoraphobia: Secondary | ICD-10-CM | POA: Diagnosis not present

## 2020-04-25 DIAGNOSIS — D649 Anemia, unspecified: Secondary | ICD-10-CM | POA: Diagnosis not present

## 2020-04-25 DIAGNOSIS — E871 Hypo-osmolality and hyponatremia: Secondary | ICD-10-CM | POA: Diagnosis not present

## 2020-04-25 DIAGNOSIS — Z7189 Other specified counseling: Secondary | ICD-10-CM | POA: Diagnosis not present

## 2020-04-25 DIAGNOSIS — R079 Chest pain, unspecified: Secondary | ICD-10-CM | POA: Diagnosis not present

## 2020-04-25 DIAGNOSIS — R319 Hematuria, unspecified: Secondary | ICD-10-CM | POA: Diagnosis not present

## 2020-04-25 DIAGNOSIS — E785 Hyperlipidemia, unspecified: Secondary | ICD-10-CM | POA: Diagnosis not present

## 2020-04-25 DIAGNOSIS — M7989 Other specified soft tissue disorders: Secondary | ICD-10-CM | POA: Diagnosis not present

## 2020-04-25 DIAGNOSIS — I5043 Acute on chronic combined systolic (congestive) and diastolic (congestive) heart failure: Secondary | ICD-10-CM | POA: Diagnosis not present

## 2020-04-25 DIAGNOSIS — Z515 Encounter for palliative care: Secondary | ICD-10-CM

## 2020-04-25 DIAGNOSIS — N390 Urinary tract infection, site not specified: Secondary | ICD-10-CM | POA: Diagnosis not present

## 2020-04-25 DIAGNOSIS — Z87891 Personal history of nicotine dependence: Secondary | ICD-10-CM | POA: Diagnosis not present

## 2020-04-25 DIAGNOSIS — K219 Gastro-esophageal reflux disease without esophagitis: Secondary | ICD-10-CM | POA: Diagnosis not present

## 2020-04-25 DIAGNOSIS — Z8379 Family history of other diseases of the digestive system: Secondary | ICD-10-CM | POA: Diagnosis not present

## 2020-04-25 DIAGNOSIS — I5033 Acute on chronic diastolic (congestive) heart failure: Secondary | ICD-10-CM | POA: Diagnosis not present

## 2020-04-25 LAB — BASIC METABOLIC PANEL
Anion gap: 10 (ref 5–15)
BUN: 25 mg/dL — ABNORMAL HIGH (ref 8–23)
CO2: 25 mmol/L (ref 22–32)
Calcium: 8.2 mg/dL — ABNORMAL LOW (ref 8.9–10.3)
Chloride: 87 mmol/L — ABNORMAL LOW (ref 98–111)
Creatinine, Ser: 0.99 mg/dL (ref 0.61–1.24)
GFR calc Af Amer: >60 mL/min (ref >60)
GFR calc non Af Amer: >60 mL/min (ref >60)
Glucose, Bld: 87 mg/dL (ref 70–99)
Potassium: 5.3 mmol/L — ABNORMAL HIGH (ref 3.5–5.1)
Sodium: 122 mmol/L — ABNORMAL LOW (ref 135–145)

## 2020-04-25 LAB — CBC
HCT: 29.4 % — ABNORMAL LOW (ref 39.0–52.0)
Hemoglobin: 9.3 g/dL — ABNORMAL LOW (ref 13.0–17.0)
MCH: 26.6 pg (ref 26.0–34.0)
MCHC: 31.6 g/dL (ref 30.0–36.0)
MCV: 84.2 fL (ref 80.0–100.0)
Platelets: 272 10*3/uL (ref 150–400)
RBC: 3.49 MIL/uL — ABNORMAL LOW (ref 4.22–5.81)
RDW: 28.1 % — ABNORMAL HIGH (ref 11.5–15.5)
WBC: 8.5 10*3/uL (ref 4.0–10.5)
nRBC: 0 % (ref 0.0–0.2)

## 2020-04-25 MED ORDER — FOSFOMYCIN TROMETHAMINE 3 G PO PACK: 3.0000 g | PACK | Freq: Once | ORAL | 0 refills | Status: AC

## 2020-04-25 MED ORDER — AMOXICILLIN-POT CLAVULANATE 875-125 MG PO TABS: 1.0000 | ORAL_TABLET | Freq: Two times a day (BID) | ORAL | 0 refills | Status: AC

## 2020-04-25 MED ORDER — OXYCODONE HCL 5 MG PO TABS: 5.0000 mg | ORAL_TABLET | ORAL | 0 refills | Status: DC | PRN

## 2020-04-25 MED ORDER — FUROSEMIDE 20 MG PO TABS: 20.0000 mg | ORAL_TABLET | Freq: Every day | ORAL | 1 refills | Status: DC

## 2020-04-25 MED ORDER — CLONAZEPAM 0.5 MG PO TABS
0.5000 mg | ORAL_TABLET | Freq: Every day | ORAL | 0 refills | Status: AC
Start: 2020-04-25 — End: ?

## 2020-04-25 MED ORDER — FOSFOMYCIN TROMETHAMINE 3 G PO PACK
3.0000 g | PACK | Freq: Once | ORAL | Status: AC
  Administered 2020-04-25: 3 g via ORAL
  Filled 2020-04-25: qty 3

## 2020-04-25 MED ORDER — HEPARIN SOD (PORK) LOCK FLUSH 100 UNIT/ML IV SOLN
500.0000 [IU] | Freq: Once | INTRAVENOUS | Status: AC
  Administered 2020-04-25: 500 [IU] via INTRAVENOUS
  Filled 2020-04-25: qty 5

## 2020-04-25 MED ORDER — LISINOPRIL 5 MG PO TABS: 5.0000 mg | ORAL_TABLET | Freq: Every day | ORAL | 1 refills | Status: DC

## 2020-04-25 MED ORDER — ACETAMINOPHEN 325 MG PO TABS: 650.0000 mg | ORAL_TABLET | Freq: Four times a day (QID) | ORAL | 1 refills | Status: AC | PRN

## 2020-04-25 MED ORDER — ELIQUIS DVT/PE STARTER PACK 5 MG PO TBPK: ORAL_TABLET | ORAL | 0 refills | Status: AC

## 2020-04-25 MED ORDER — SPIRONOLACTONE 25 MG PO TABS: 12.5000 mg | ORAL_TABLET | Freq: Every day | ORAL | 1 refills | Status: DC

## 2020-04-25 MED ORDER — SENNOSIDES-DOCUSATE SODIUM 8.6-50 MG PO TABS: 2.0000 | ORAL_TABLET | Freq: Every day | ORAL | 1 refills | Status: DC

## 2020-04-25 MED ORDER — SODIUM ZIRCONIUM CYCLOSILICATE 5 G PO PACK
5.0000 g | PACK | Freq: Once | ORAL | Status: AC
  Administered 2020-04-25: 5 g via ORAL
  Filled 2020-04-25: qty 1

## 2020-04-25 NOTE — Discharge Summary (Addendum)
[name], is a 69 y.o. male  DOB [birth year]  MRN [id number].  Admission date:  04/21/2020  Admitting Physician  Neena Rhymes, MD  Discharge Date:  04/25/2020   Primary MD  [name], MD  Recommendations for primary care physician for things to follow:   -- 1)Avoid ibuprofen/Advil/Aleve/Motrin/Goody Powders/Naproxen/BC powders/Meloxicam/Diclofenac/Indomethacin and other Nonsteroidal anti-inflammatory medications as these will make you more likely to bleed and can cause stomach ulcers, can also cause Kidney problems.   2)Please follow-up with Interventional Radiology (IR) at [facility] for bilateral nephrostomy tube reevaluation in 1 to 2 weeks  3)Please follow- up with Dr. Delton Coombes (oncologist) in 2 to 3 weeks----   4)Please Repeat CBC and BMP in 4 to 6 days---  5)Take Eliquis/Apixaban for blood clot in your lungs as prescribed  Admission Diagnosis  Hyponatremia [E87.1] Pleural effusion [J90] Chronic anemia [D64.9] Acute on chronic diastolic (congestive) heart failure (HCC) [I50.33] Acute on chronic right-sided congestive heart failure (HCC) [I50.813] Multiple subsegmental pulmonary emboli without acute cor pulmonale (HCC) [I26.94]   Discharge Diagnosis  Hyponatremia [E87.1] Pleural effusion [J90] Chronic anemia [D64.9] Acute on chronic diastolic (congestive) heart failure (HCC) [I50.33] Acute on chronic right-sided congestive heart failure (HCC) [I50.813] Multiple subsegmental pulmonary emboli without acute cor pulmonale (HCC) [I26.94]    Principal Problem:   Pulmonary embolism (HCC) Active Problems:   Acute on chronic HFrEF (heart failure with reduced ejection fraction) -combined systolic and diastolic dysfunction CHF   Essential hypertension, benign   Metastatic urothelial carcinoma (HCC)   UTI (urinary tract infection)   Acute on chronic diastolic (congestive) heart  failure ([place])      Past Medical History:  Diagnosis Date  . Arterial embolus of lower extremity ([place]) 11/23/2013  . Arthritis   . CAD (coronary artery disease)    a. s/p PCI with 3.5 x 18 BMS to mid RCA, non obst LAD, Lcx patent 08/10/13  . Cancer ([place]) 2021   bladder  . Depression, major, single episode, mild ([place]) 12/20/2019  . ED (erectile dysfunction) 03/27/2015  . Embolus of femoral artery [facility]) 2012   Surgical repair, previously on Coumadin  . Essential hypertension   . ETOH abuse   . Hematuria 10/17/2019  . Hernia of abdominal cavity 02/27/2019  . History of DVT (deep vein thrombosis) 2011  . History of GI bleed    February 2012   . Ingrown left greater toenail 09/04/2018  . 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 Low oxygen saturation 01/31/2020  . 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 Pain with urination 10/17/2019  . Port-A-Cath in place 02/23/2020  . Prosthetic valve endocarditis ([place])   . PVD (peripheral vascular disease) ([place])   . S/P aortic valve replacement    Details not clear -  reportedly 1971 per patient    Past Surgical History:  Procedure Laterality Date  . ABDOMINAL AORTOGRAM W/LOWER EXTREMITY N/A 10/31/2018   Procedure:  ABDOMINAL AORTOGRAM W/LOWER EXTREMITY;  Surgeon: Lorretta Harp, MD;  Location: [place] CV LAB;  Service: Cardiovascular;  Laterality: N/A;  . Aoric valve replacement  January 2005   73mm Medtronic Freestyle stentless porcine bioprosthesis [facility])  . CARDIAC CATHETERIZATION     30-40% mid LAD, 20-30% ostial D1, proximal 30-40% RCA, 95% mid RCA thrombus, 40% distal stenosis. EF 45%, inferior HK.  [name] COLONOSCOPY N/A 04/20/2014   Procedure: COLONOSCOPY;  Surgeon: Rogene Houston, MD;  [facility];  Service: Endoscopy;  Laterality: N/A;  930  . COLONOSCOPY WITH PROPOFOL N/A 02/02/2020   Procedure: COLONOSCOPY WITH PROPOFOL;  Surgeon: Rogene Houston, MD;   [facility];  Service: Endoscopy;  Laterality: N/A;  . CORONARY ANGIOPLASTY WITH STENT PLACEMENT  07/2013  . ESOPHAGOGASTRODUODENOSCOPY (EGD) WITH PROPOFOL N/A 02/02/2020   Procedure: ESOPHAGOGASTRODUODENOSCOPY (EGD) WITH PROPOFOL;  Surgeon: Rogene Houston, MD;  [facility];  Service: Endoscopy;  Laterality: N/A;  . INGUINAL HERNIA REPAIR Bilateral 04/17/2019   Procedure: ATTEMPTED LAPAROSCOPIC BILATERAL INGUINAL HERNIA REPAIR;  Surgeon: Coralie Keens, MD;  Location: [place];  Service: General;  Laterality: Bilateral;  . INGUINAL HERNIA REPAIR Bilateral 04/17/2019   Procedure: Hernia Repair Inguinal Adult;  Surgeon: Coralie Keens, MD;  Location: [place];  Service: General;  Laterality: Bilateral;  . IR IMAGING GUIDED PORT INSERTION  02/22/2020  . IR NEPHROSTOMY EXCHANGE LEFT  04/05/2020  . IR NEPHROSTOMY EXCHANGE RIGHT  04/05/2020  . IR NEPHROSTOMY PLACEMENT LEFT  02/22/2020  . IR NEPHROSTOMY PLACEMENT RIGHT  02/22/2020  . LEFT HEART CATHETERIZATION WITH CORONARY ANGIOGRAM N/A 08/10/2013   Procedure: LEFT HEART CATHETERIZATION WITH CORONARY ANGIOGRAM;  Surgeon: Blane Ohara, MD;  Location: [facility] CATH LAB;  Service: Cardiovascular;  Laterality: N/A;  . Left leg embolectomy  May 2012   Dr. Trula Slade - reason for lifelong coumadin  . LYMPH NODE BIOPSY Left 11/21/2019   Procedure: Left Neck LYMPH NODE BIOPSY;  Surgeon: Rozetta Nunnery, MD;  Location: [place];  Service: ENT;  Laterality: Left;  . PERIPHERAL VASCULAR ATHERECTOMY  10/31/2018   Procedure: PERIPHERAL VASCULAR ATHERECTOMY;  Surgeon: Lorretta Harp, MD;  Location: [place] CV LAB;  Service: Cardiovascular;;  left SFA  . POLYPECTOMY  02/02/2020   Procedure: POLYPECTOMY;  Surgeon: Rogene Houston, MD;  [facility];  Service: Endoscopy;;  splenic flexure     HPI  from the history and physical done on the day of admission:   Chief Complaint: progressive SOB/DOE  HPI: [name] is a 69 y.o.  male with medical history significant of CAD with PCI, history of femoral artery embolus, also history of DVT, with recent diagnosis of urothelial cancer with hydronephrosis 2/2 distal obstruction currently with bilateral nephrostomy tubes. He was discharged from the hospital 04/15/20 ago having been admitted for shortness of breath secondary to CHF,returns today for worsening shortness of breath and UTI. He was to take Augmentin, based on culture ss but had not started. He states he has had a slowly progressive worsening of symptoms, currently having difficulty speaking in full sentences, states his symptoms are worse at night and worse with exertion, better but not great when he is sitting in an upright position. He denies fevers or chills. He woke this morning with some dry heaves and endorses reduced appetite which has been a chronic problem since starting chemotherapy, but no current nausea or vomiting. He describes a midsternal chest pressure sensation which is worse with cough  episodes which have been nonproductive. He is scheduled for his next chemotherapy treatment tomorrow morning. He endorses bilateral lower extremity edema which is chronic but worsened today, stating he has swelling to his knees. He has been taking his increased Lasix 40 mg twice daily dosing. He has not been checking his weights. Denies exposure to COVID-19 and has been fully vaccinated.   ED Course: BP stable, RR stable, O2 sat normal. Lab reveals Na 129, Hgb 9.4-stable since last hospitalization and due to chemotherapy. BNP >4,500. U/A positive. CTA reveals posterior basal segmental PE RLL, Anterior LUL semgental PE and Basal segment LLL PE. Full dose heparin initiated in ED. TRH asked to admit for continue mgt of PE and acute on chronic diastolic heart failure.  Review of Systems: As per HPI otherwise 10 point review of systems negative.       Hospital Course:   Brief Summary: 69 y.o.malewith a history of CAD  with PCI, history of femoral artery embolus, also history of DVT, with recent diagnosis of kidney cancer currently with bilateral nephrostomy tubes admitted on 04/22/2019 for bilateral PE after being recently discharged on 04/17/2020   A/p 1)Bilateral PE in the setting of underlying malignancy--- patient with prior history of DVT, -We will transition from IV heparin to Eliquis p.m. of 04/24/2020--- will most likely need lifelong anticoagulation -Echo as below #2 -Discharging on Eliquis  2)HFrEF/ --patient with acute on chronic combined diastolic and systolic dysfunction CHF--- repeat echo from 04/23/2020 with EF down to 25 to 30%  from 45 to 50% on 01/05/2020 -Echo now demonstrates global hypokinesis of right heart strain and moderately elevated pulmonary artery pressures -c/n lasix, aldactone, and Lisinopril  3)-- Metatstatic Urothelial Carcinoma - new evidence of diffuse bony mets-----urothelialcancer with hydronephrosis 2/2 distal obstructioncurrently with bilateral nephrostomy tubes--- patient will need ongoing follow-up with IR and urology, as well as oncology, he was scheduled to get chemotherapy on 04/23/2020 -Patient follow-up with Dr. Delton Coombes advised -Admission follow-up with the patient radiology and urology advised  4)Social/Ethics--patient is a Full code, palliative care consult requested  5)Recent urine culture with Acinetobacter and Enterococcus--- patient was treated in-house and then discharged on Augmentin which was continued during hospital stay  --repeat UA from 04/23/20 with Enterococcus faecalis sensitivities pending -Treat empirically with fosfomycin, patient received first dose on 04/25/2020 here, Give 3 g orally x1 dose on Sunday  04/28/2020  6)Acute respiratory failure with hypoxia--- secondary to #1 above --Resolved, no further hypoxia at this time  -No shortness of breath or dyspnea on exertion  7)S/p TAVR---aortic valve has been repaired/replaced. Aortic valve    regurgitation is trivial. There is a 26 mm Edwards Sapien prosthetic  (TAVR) valve present in the aortic position (patient has valve in valve  TAVR with previous bioprosthetic AVR). Aortic valve  mean gradient measures 8.0 mmHg.  There is a moderate paravalvular leak at 9 o'clock position.  -Outpatient follow-up with cardiology advised  8) chronic anemia--- H&H appears stable at this time around 9.3 in the setting of underlying malignancy  Disposition--discharge to SNF rehab   Code Status : Full Code  Family Communication:   (patient is alert, awake and coherent)   Consults  :  Palliative  Discharge Condition: stable  Follow UP   Contact information for follow-up providers    [name], MD. Go on 04/29/2020.   Specialty: Family Medicine Why: Appointment with PCP scheduled for 04/29/20 at 2:20pm Contact information: [address], [place] [address] [phone]  Satira Sark, MD .   Specialty: Cardiology Contact information: [place] [address] [phone]        Derek Jack, MD Follow up in 3 week(s).   Specialty: Hematology Contact information: [place] [address] [phone]        [facility] IR Follow up in 1 week(s).   Why: Bilateral Nephrostomy Tubes       [facility] [place]. Schedule an appointment as soon as possible for a visit in 1 week(s).   Specialty: Radiology Why: Bilateral Nephrostomy Contact information: [address] [id number] [place] [phone]           Contact information for after-discharge care    Destination    [facility] .   Service: Skilled Nursing Contact information: [place] [phone]                  Diet and Activity recommendation:  As advised  Discharge Instructions    Discharge Instructions     Call MD for:  difficulty breathing, headache or visual disturbances   Complete by: As directed    Call MD for:  persistant dizziness or light-headedness   Complete by: As directed    Call MD for:  persistant nausea and vomiting   Complete by: As directed    Call MD for:  severe uncontrolled pain   Complete by: As directed    Call MD for:  temperature >100.4   Complete by: As directed    Diet - low sodium heart healthy   Complete by: As directed    Discharge instructions   Complete by: As directed    -- 1)Avoid ibuprofen/Advil/Aleve/Motrin/Goody Powders/Naproxen/BC powders/Meloxicam/Diclofenac/Indomethacin and other Nonsteroidal anti-inflammatory medications as these will make you more likely to bleed and can cause stomach ulcers, can also cause Kidney problems.   2)Please follow-up with Interventional Radiology (IR) at [facility] for bilateral nephrostomy tube reevaluation in 1 to 2 weeks  3)Please follow- up with Dr. Delton Coombes (oncologist) in 2 to 3 weeks----   4)Please Repeat CBC and BMP in 4 to 6 days---  5)Take Eliquis/Apixaban for blood clot in your lungs as prescribed   Increase activity slowly   Complete by: As directed        Discharge Medications     Allergies as of 04/25/2020   No Known Allergies     Medication List    STOP taking these medications   clopidogrel 75 MG tablet Commonly known as: PLAVIX     TAKE these medications   acetaminophen 325 MG tablet Commonly known as: TYLENOL Take 2 tablets (650 mg total) by mouth every 6 (six) hours as needed for mild pain or moderate pain. What changed:   when to take this  reasons to take this   amoxicillin-clavulanate 875-125 MG tablet Commonly known as: AUGMENTIN Take 1 tablet by mouth every 12 (twelve) hours for 4 days.   aspirin EC 81 MG tablet Take 1 tablet (81 mg total) by mouth daily.   atorvastatin 40 MG tablet Commonly known as: LIPITOR Take 1 tablet (40 mg total) by mouth daily.   busPIRone  10 MG tablet Commonly known as: BUSPAR Take 1 tablet (10 mg total) by mouth 2 (two) times daily.   cholecalciferol 25 MCG (1000 UNIT) tablet Commonly known as: VITAMIN D Take 1 tablet (1,000 Units total) by mouth daily.   clonazePAM 0.5 MG tablet  Commonly known as: KlonoPIN Take 1 tablet (0.5 mg total) by mouth at bedtime.   Eliquis DVT/PE Starter Pack Generic drug: Apixaban Starter Pack (10mg  and 5mg ) Take as directed on package: start with two-5mg  tablets twice daily for 7 days. On day 8, switch to one-5mg  tablet twice daily.   escitalopram 10 MG tablet Commonly known as: LEXAPRO Take 1 tablet (10 mg total) by mouth daily.   fluticasone 50 MCG/ACT nasal spray Commonly known as: FLONASE Use 2 spray(s) in each nostril once daily What changed:   how much to take  how to take this  when to take this  additional instructions   fosfomycin 3 g Pack Commonly known as: MONUROL Take 3 g by mouth once for 1 dose. Give 3 g orally x1 dose on Sunday  04/28/2020 Start taking on: April 28, 2020   furosemide 20 MG tablet Commonly known as: LASIX Take 1 tablet (20 mg total) by mouth daily. What changed:   medication strength  how much to take  when to take this   lisinopril 5 MG tablet Commonly known as: ZESTRIL Take 1 tablet (5 mg total) by mouth daily.   magnesium oxide 400 (241.3 Mg) MG tablet Commonly known as: MAG-OX Take 1 tablet (400 mg total) by mouth 2 (two) times daily.   metoprolol succinate 50 MG 24 hr tablet Commonly known as: TOPROL-XL TAKE 1 TABLET BY MOUTH ONCE DAILY WITH  OR  IMMEDIATELY  FOLLOWING  A  MEAL What changed:   how much to take  how to take this  when to take this  additional instructions   montelukast 10 MG tablet Commonly known as: SINGULAIR Take 1 tablet (10 mg total) by mouth at bedtime.   nitroGLYCERIN 0.4 MG SL tablet Commonly known as: NITROSTAT Place 1 tablet (0.4 mg total) under the tongue every 5 (five) minutes as needed  for chest pain.   oxyCODONE 5 MG immediate release tablet Commonly known as: Oxy IR/ROXICODONE Take 1 tablet (5 mg total) by mouth every 4 (four) hours as needed for moderate pain or severe pain.   pantoprazole 40 MG tablet Commonly known as: PROTONIX Take 1 tablet (40 mg total) by mouth daily before breakfast.   potassium chloride 10 MEQ tablet Commonly known as: KLOR-CON Take 1 tablet (10 mEq total) by mouth every other day. What changed: when to take this   prochlorperazine 10 MG tablet Commonly known as: COMPAZINE Take 1 tablet (10 mg total) by mouth every 6 (six) hours as needed (Nausea or vomiting).   senna-docusate 8.6-50 MG tablet Commonly known as: Senokot-S Take 2 tablets by mouth at bedtime.   spironolactone 25 MG tablet Commonly known as: ALDACTONE Take 0.5 tablets (12.5 mg total) by mouth daily. Start taking on: April 26, 1609   trolamine salicylate 10 % cream Commonly known as: ASPERCREME Apply 1 application topically daily as needed for muscle pain (for pain in feet). For pain in legs (swelling)       Major procedures and Radiology Reports - PLEASE review detailed and final reports for all details, in brief -    DG Chest 2 View  Result Date: 04/12/2020 CLINICAL DATA:  Short of breath EXAM: CHEST - 2 VIEW COMPARISON:  03/06/2020, 02/12/2020 FINDINGS: Right-sided central venous port tip over the SVC. Post sternotomy changes with valve prostheses. Cardiomegaly. Small right greater than left pleural effusion. Scattered foci of sclerosis at the ribs and spine consistent with metastatic disease. Incompletely visualized pigtail catheter in the right  upper quadrant. IMPRESSION: Cardiomegaly with small right greater than left pleural effusions. Scattered sclerotic foci overlying the ribs and spine, likely corresponding to known skeletal metastatic disease. Electronically Signed   By: Donavan Foil M.D.   On: 04/12/2020 21:47   CT Angio Chest PE W and/or Wo  Contrast  Result Date: 04/21/2020 CLINICAL DATA:  Increasing shortness of breath, history of bladder cancer, DVT EXAM: CT ANGIOGRAPHY CHEST WITH CONTRAST TECHNIQUE: Multidetector CT imaging of the chest was performed using the standard protocol during bolus administration of intravenous contrast. Multiplanar CT image reconstructions and MIPs were obtained to evaluate the vascular anatomy. CONTRAST:  49mL OMNIPAQUE IOHEXOL 350 MG/ML SOLN COMPARISON:  CT 02/02/2020 FINDINGS: Cardiovascular: Satisfactory opacification of the pulmonary arteries to the segmental level. Suspect filling defects within the segmental branches of the posterior basal segment right lower lobe (5/190) though difficult to fully assess given hypoventilatory changes/passive atelectasis in this region as well as some respiratory motion artifact. No other convincing pulmonary arterial filling defects additional peripherally marginated segmental filling defects seen in the anterior segmental branches left upper lobe (5/79) and lateral basal segmental branch of the left lower lobe (5/172). Central pulmonary arteries remain normal caliber. Diffuse cardiomegaly with biatrial enlargement and reflux of contrast into the IVC and hepatic veins. Transcatheter aortic valve replacement is in position. Coronary artery calcifications are present. Rib right IJ Port-A-Cath tip terminates at the level of the superior cavoatrial junction. No other major venous abnormalities. Mediastinum/Nodes: Postsurgical changes from prior sternotomy. No mediastinal fluid or gas. Normal thyroid gland and thoracic inlet. No acute abnormality of the trachea or esophagus. No worrisome mediastinal, hilar or axillary adenopathy. Lungs/Pleura: Moderate right pleural effusion with subtotal atelectatic collapse of the right lower lobe with further subsegmental atelectatic changes in the right upper and middle lobes as well. Stable biapical pleuroparenchymal scarring and blebs with  calcified right apical granuloma. Upper Abdomen: Coil pack seen in the central liver. Bilateral percutaneous nephrostomies. Small volume upper abdominal ascites. Musculoskeletal: Diffuse body wall edema. New extensive multifocal areas of sclerotic osseous metastatic disease throughout the axial skeleton, sternum and ribs as well as the bilateral scapula and right humerus. No acute fracture. Postsurgical changes from prior sternotomy with intact sternal sutures and bony fusion across the sternum. No worrisome chest wall lesions. Review of the MIP images confirms the above findings. IMPRESSION: 1. Segmental and subsegmental filling defects in the posterior basal segment right lower lobe, anterior segment left upper lobe and basal segmental left lower lobe. 2. Cardiomegaly with biatrial enlargement and reflux of contrast into the IVC and hepatic veins, suggestive of elevated right heart pressure/right heart failure. 3. Moderate right pleural effusion with subtotal atelectatic collapse of the right lower lobe with further subsegmental atelectatic changes in the right upper and middle lobes as well. Possibly related to volume status though malignant effusion is not excluded. 4. Additional features of anasarca with circumferential body wall edema and upper abdominal ascites. 5. Extensive new multifocal areas of sclerotic osseous metastatic disease. 6. Prior transcatheter aortic valve replacement as well as sternotomy. 7. Coronary artery atherosclerosis. 8. Bilateral percutaneous nephrostomies. 9. Aortic Atherosclerosis (ICD10-I70.0). Electronically Signed   By: Lovena Le M.D.   On: 04/21/2020 16:18   CT ABDOMEN PELVIS W CONTRAST  Result Date: 04/12/2020 CLINICAL DATA:  Abdomen pain possible biliary obstruction bilateral leg swelling EXAM: CT ABDOMEN AND PELVIS WITH CONTRAST TECHNIQUE: Multidetector CT imaging of the abdomen and pelvis was performed using the standard protocol following bolus administration of  intravenous  contrast. CONTRAST:  192mL OMNIPAQUE IOHEXOL 300 MG/ML  SOLN COMPARISON:  CT 03/06/2020, 02/02/2020 FINDINGS: Lower chest: Lung bases demonstrate new small moderate right pleural effusion. Marked cardiomegaly with multi chamber enlargement. Hepatobiliary: Mild loculated fluid along the anterior aspect of the liver. No focal hepatic abnormality. No calcified gallstone or biliary dilatation. Embolization coils at the porta hepatis. Pancreas: Unremarkable. No pancreatic ductal dilatation or surrounding inflammatory changes. Spleen: Unchanged lobulated spleen with coarse calcification. Adrenals/Urinary Tract: Adrenal glands are within normal limits. Bilateral nephrostomy tubes remain in place. No significant hydronephrosis. Cortical scarring within both kidneys. Slightly dense exophytic lesion off the upper pole right kidney measuring 2.3 x 1.4 cm, previously 2.3 x 1.5 cm. Eccentric right bladder mass measures slightly smaller today, 7 cm AP x 5 cm transverse compared with 8.4 x 4.5 cm previously. Possible right seminal vesicle tumor invasion as before. Stomach/Bowel: The stomach is decompressed. No dilated small bowel. No bowel wall thickening. Vascular/Lymphatic: Tortuous aorta with atherosclerosis. No aneurysm. Decreased retroperitoneal and pelvic adenopathy. For example left Peri aortic lymph node measures 8 mm compared with 11 mm previously, series 2, image number 19. Right external iliac node now measures 17 mm, compared with 3.7 cm previously. Reproductive: Prostate enlarged. 2.4 cm relative area of hyperenhancement within the left posterior gland. Other: No free air. Extensive subcutaneous edema consistent with anasarca. Large bilateral scrotal fluid collections. Musculoskeletal: Multiple sclerotic foci within the pelvic bones, spine and ribs consistent with osseous metastatic disease. Some of the lesions appear increased in size with more dense sclerosis, for example left inferior pubic ramus lesion  measures 22 mm compared with 20 mm previously. Increased size of sclerotic lesions within the lumbar vertebra. IMPRESSION: 1. Negative for calcified gallstone or biliary dilatation. 2. Extensive subcutaneous edema consistent with generalized anasarca with large bilateral scrotal fluid collections. Cardiomegaly with small moderate right pleural effusion. 3. Slight interval decrease in size of eccentric right bladder mass. Decreased retroperitoneal and pelvic adenopathy. New small amount loculated right perihepatic fluid. 4. Slight interval decrease in size of eccentric right bladder mass with possible invasion of right seminal vesicle. Decreased retroperitoneal and pelvic adenopathy compared to most recent prior. 5. Stable positioning of bilateral nephrostomy tubes without hydronephrosis 6. Stable slightly dense exophytic lesion off the upper pole of right kidney 7. New focal area of hyperenhancement within the left posterior prostate gland, indeterminate for a prostate mass. 8. Diffuse skeletal metastatic disease. Interval enlargement of multiple lesions involving the spine and pelvis. Aortic Atherosclerosis (ICD10-I70.0). Electronically Signed   By: Donavan Foil M.D.   On: 04/12/2020 23:49   DG Chest Port 1 View  Result Date: 04/21/2020 CLINICAL DATA:  Increasing shortness of breath since last night. EXAM: PORTABLE CHEST 1 VIEW COMPARISON:  Radiograph 04/12/2020.  CT 02/02/2020 FINDINGS: Accessed right chest port in place. Post median sternotomy. Stable cardiomegaly and prosthetic aortic valve/root. There is new volume loss at the right lung base with ill-defined opacity. Minimal fluid in the right minor fissure versus adjacent atelectasis. The left lung is clear. There is no pulmonary edema. No pneumothorax. Blunting of the right costophrenic angle may be chronic or small effusion. IMPRESSION: 1. New volume loss at the right lung base with ill-defined opacity. This may represent atelectasis, pneumonia or  potentially aspiration in the appropriate clinical setting. Possible small right pleural effusion. 2. Stable cardiomegaly. Electronically Signed   By: Keith Rake M.D.   On: 04/21/2020 15:03   ECHOCARDIOGRAM COMPLETE  Result Date: 04/23/2020    ECHOCARDIOGRAM REPORT  Patient Name:   [name] Date of Exam: 04/23/2020 Medical Rec #:  [id number]        Height:       64.0 in Accession #:    [id number]       Weight:       137.1 lb Date of Birth:  [birth year]        BSA:          1.666 m Patient Age:    2 years         BP:           109/67 mmHg Patient Gender: M                HR:           75 bpm. Exam Location:  [facility] Procedure: 2D Echo, Cardiac Doppler and Color Doppler Indications:    Pulmonary Embolus 415.19 / I26.99  History:        Patient has prior history of Echocardiogram examinations, most                 recent 01/05/2020. CHF; Risk Factors:Hypertension. History of DVT                 (deep vein thrombosis) (Resolved), Alcohol abuse (Resolved), HX                 OF AORTIC VALVE REPLACEMENT, Cancer from history.                 Aortic Valve: 26 mm Edwards Sapien prosthetic, stented (TAVR)                 valve is present in the aortic position.  Sonographer:    Alvino Chapel RCS Referring Phys: [phone] Makita Blow IMPRESSIONS  1. Left ventricular ejection fraction, by estimation, is 25 to 30%. The left ventricle has severely decreased function. The left ventricle demonstrates global hypokinesis. The left ventricular internal cavity size was moderately dilated. There is mild left ventricular hypertrophy. Left ventricular diastolic parameters are indeterminate.  2. Right ventricular systolic function is severely reduced. The right ventricular size is mildly enlarged. There is moderately elevated pulmonary artery systolic pressure. The estimated right ventricular systolic pressure is 80.0 mmHg.  3. Left atrial size was severely dilated.  4. Right atrial size was severely dilated.  5. The  mitral valve is abnormal. Mild to moderate mitral valve regurgitation.  6. The aortic valve has been repaired/replaced. Aortic valve regurgitation is trivial. There is a 26 mm Edwards Sapien prosthetic (TAVR) valve present in the aortic position (patient has valve in valve TAVR with previous bioprosthetic AVR). Aortic valve  mean gradient measures 8.0 mmHg. There is a moderate paravalvular leak at 9 o'clock position.  7. The inferior vena cava is dilated in size with <50% respiratory variability, suggesting right atrial pressure of 15 mmHg. FINDINGS  Left Ventricle: Left ventricular ejection fraction, by estimation, is 25 to 30%. The left ventricle has severely decreased function. The left ventricle demonstrates global hypokinesis. The left ventricular internal cavity size was moderately dilated. There is mild left ventricular hypertrophy. Left ventricular diastolic parameters are indeterminate. Right Ventricle: The right ventricular size is mildly enlarged. No increase in right ventricular wall thickness. Right ventricular systolic function is severely reduced. There is moderately elevated pulmonary artery systolic pressure. The tricuspid regurgitant velocity is 2.89 m/s, and with an assumed right atrial pressure of 15 mmHg, the estimated right ventricular systolic pressure is 34.9 mmHg. Left  Atrium: Left atrial size was severely dilated. Right Atrium: Right atrial size was severely dilated. Pericardium: There is no evidence of pericardial effusion. Mitral Valve: The mitral valve is abnormal. There is moderate thickening of the mitral valve leaflet(s). Mild to moderate mitral valve regurgitation. Tricuspid Valve: The tricuspid valve is grossly normal. Tricuspid valve regurgitation is mild. Aortic Valve: The aortic valve has been repaired/replaced. Aortic valve regurgitation is trivial. Aortic regurgitation PHT measures 354 msec. Aortic valve mean gradient measures 8.0 mmHg. Aortic valve peak gradient measures 16.5  mmHg. Aortic valve area, by VTI measures 1.48 cm. There is a 26 mm Edwards Sapien prosthetic, stented (TAVR) valve present in the aortic position. Pulmonic Valve: The pulmonic valve was grossly normal. Pulmonic valve regurgitation is mild to moderate. Aorta: The aortic root is normal in size and structure. Venous: The inferior vena cava is dilated in size with less than 50% respiratory variability, suggesting right atrial pressure of 15 mmHg. IAS/Shunts: No atrial level shunt detected by color flow Doppler.  LEFT VENTRICLE PLAX 2D LVIDd:         6.63 cm      Diastology LVIDs:         5.69 cm      LV e' lateral:   11.00 cm/s LV PW:         1.24 cm      LV E/e' lateral: 9.7 LV IVS:        1.20 cm LVOT diam:     2.00 cm LV SV:         57 LV SV Index:   34 LVOT Area:     3.14 cm  LV Volumes (MOD) LV vol d, MOD A2C: 273.0 ml LV vol d, MOD A4C: 235.0 ml LV vol s, MOD A2C: 175.0 ml LV vol s, MOD A4C: 166.0 ml LV SV MOD A2C:     98.0 ml LV SV MOD A4C:     235.0 ml LV SV MOD BP:      84.4 ml RIGHT VENTRICLE RV S prime:     7.84 cm/s TAPSE (M-mode): 1.5 cm LEFT ATRIUM              Index        RIGHT ATRIUM           Index LA diam:        4.40 cm  2.64 cm/m   RA Area:     31.80 cm LA Vol (A2C):   176.0 ml 105.61 ml/m RA Volume:   123.00 ml 73.81 ml/m LA Vol (A4C):   111.0 ml 66.61 ml/m LA Biplane Vol: 144.0 ml 86.41 ml/m  AORTIC VALVE AV Area (Vmax):    1.56 cm AV Area (Vmean):   1.61 cm AV Area (VTI):     1.48 cm AV Vmax:           203.00 cm/s AV Vmean:          127.000 cm/s AV VTI:            0.389 m AV Peak Grad:      16.5 mmHg AV Mean Grad:      8.0 mmHg LVOT Vmax:         101.00 cm/s LVOT Vmean:        64.900 cm/s LVOT VTI:          0.183 m LVOT/AV VTI ratio: 0.47 AI PHT:            354 msec  AORTA  Ao Root diam: 3.30 cm MITRAL VALVE                 TRICUSPID VALVE MV Area (PHT): 4.89 cm      TR Peak grad:   33.4 mmHg MV Decel Time: 155 msec      TR Vmax:        289.00 cm/s MR Peak grad:    90.6 mmHg MR Mean  grad:    50.0 mmHg   SHUNTS MR Vmax:         476.00 cm/s Systemic VTI:  0.18 m MR Vmean:        318.0 cm/s  Systemic Diam: 2.00 cm MR PISA:         1.57 cm MR PISA Eff ROA: 11 mm MR PISA Radius:  0.50 cm MV E velocity: 107.00 cm/s Rozann Lesches MD Electronically signed by Rozann Lesches MD Signature Date/Time: 04/23/2020/5:43:09 PM    Final    IR NEPHROSTOMY EXCHANGE LEFT  Result Date: 04/05/2020 CLINICAL DATA:  Metastatic urothelial cancer, status post percutaneous nephrostomy catheter placement 02/22/2020. Patient presents for his first scheduled nephrostomy exchange. No problems with the catheters thus far. EXAM: BILATERAL PERCUTANEOUS NEPHROSTOMY CATHETER EXCHANGE UNDER FLUOROSCOPY TECHNIQUE: The procedure, risks (including but not limited to bleeding, infection, organ damage ), benefits, and alternatives were explained to the patient. Questions regarding the procedure were encouraged and answered. The patient understands and consents to the procedure. The nephrostomy tubes and surrounding skin were prepped with Betadine, draped in usual sterile fashion. A small amount of contrast was injected through the right nephrostomy catheter to opacify the renal collecting system. The catheter was cut and exchanged over a 0.035" angiographic wire for a new 10-French pigtail catheter, formed centrally within the collecting system under fluoroscopy. Contrast injection confirms appropriate positioning. In a similar fashion, the left nephrostomy catheter was injected, cut, and exchanged for a new 10-French pigtail catheter, formed centrally within the left renal collecting system. Injection confirms appropriate positioning and patency. There is very little in the way of deposits or debris associated with the removed catheters. Both catheters were secured externally with 0 Prolene sutures. The patient tolerated the procedure well. FLUOROSCOPY TIME:  15 mGy; 1 minutes COMPLICATIONS: None immediate IMPRESSION: 1.  Technically successful exchange of bilateral nephrostomy catheters under fluoroscopy. Follow-up 8 weeks. Electronically Signed   By: Lucrezia Europe M.D.   On: 04/05/2020 15:47   IR NEPHROSTOMY EXCHANGE RIGHT  Result Date: 04/05/2020 CLINICAL DATA:  Metastatic urothelial cancer, status post percutaneous nephrostomy catheter placement 02/22/2020. Patient presents for his first scheduled nephrostomy exchange. No problems with the catheters thus far. EXAM: BILATERAL PERCUTANEOUS NEPHROSTOMY CATHETER EXCHANGE UNDER FLUOROSCOPY TECHNIQUE: The procedure, risks (including but not limited to bleeding, infection, organ damage ), benefits, and alternatives were explained to the patient. Questions regarding the procedure were encouraged and answered. The patient understands and consents to the procedure. The nephrostomy tubes and surrounding skin were prepped with Betadine, draped in usual sterile fashion. A small amount of contrast was injected through the right nephrostomy catheter to opacify the renal collecting system. The catheter was cut and exchanged over a 0.035" angiographic wire for a new 10-French pigtail catheter, formed centrally within the collecting system under fluoroscopy. Contrast injection confirms appropriate positioning. In a similar fashion, the left nephrostomy catheter was injected, cut, and exchanged for a new 10-French pigtail catheter, formed centrally within the left renal collecting system. Injection confirms appropriate positioning and patency. There is very little in the way  of deposits or debris associated with the removed catheters. Both catheters were secured externally with 0 Prolene sutures. The patient tolerated the procedure well. FLUOROSCOPY TIME:  15 mGy; 1 minutes COMPLICATIONS: None immediate IMPRESSION: 1. Technically successful exchange of bilateral nephrostomy catheters under fluoroscopy. Follow-up 8 weeks. Electronically Signed   By: Lucrezia Europe M.D.   On: 04/05/2020 15:47   Micro  Results   Recent Results (from the past 240 hour(s))  SARS Coronavirus 2 by RT PCR (hospital order, performed in [facility] hospital lab) Nasopharyngeal Nasopharyngeal Swab     Status: None   Collection Time: 04/21/20  8:41 PM   Specimen: Nasopharyngeal Swab  Result Value Ref Range Status   SARS Coronavirus 2 NEGATIVE NEGATIVE Final    Comment: (NOTE) SARS-CoV-2 target nucleic acids are NOT DETECTED.  The SARS-CoV-2 RNA is generally detectable in upper and lower respiratory specimens during the acute phase of infection. The lowest concentration of SARS-CoV-2 viral copies this assay can detect is 250 copies / mL. A negative result does not preclude SARS-CoV-2 infection and should not be used as the sole basis for treatment or other patient management decisions.  A negative result may occur with improper specimen collection / handling, submission of specimen other than nasopharyngeal swab, presence of viral mutation(s) within the areas targeted by this assay, and inadequate number of viral copies (<250 copies / mL). A negative result must be combined with clinical observations, patient history, and epidemiological information.  Fact Sheet for Patients:   StrictlyIdeas.no  Fact Sheet for Healthcare Providers: BankingDealers.co.za  This test is not yet approved or  cleared by the [place] FDA and has been authorized for detection and/or diagnosis of SARS-CoV-2 by FDA under an Emergency Use Authorization (EUA).  This EUA will remain in effect (meaning this test can be used) for the duration of the COVID-19 declaration under Section 564(b)(1) of the Act, 21 U.S.C. section 360bbb-3(b)(1), unless the authorization is terminated or revoked sooner.  Performed at [facility], [address] [place], [place] [address]   Urine Culture     Status: Abnormal (Preliminary result)   Collection Time: 04/23/20  6:30 AM   Specimen: Urine, Clean  Catch  Result Value Ref Range Status   Specimen Description   Final    URINE, CLEAN CATCH Performed at [facility], [address], [place], [place] [address]    Special Requests   Final    Immunocompromised Performed at [facility], [address], [place], [place] [address]    Culture (A)  Final    60,000 COLONIES/mL ENTEROCOCCUS FAECALIS REPEATING SUSCEPTIBILITY  Performed at [facility], [place] [address] [place], [place] [address]    Report Status PENDING  Incomplete       Today   Subjective    [name] today has no new concerns --          Patient has been seen and examined prior to discharge   Objective   Blood pressure 122/74, pulse 70, temperature 98.7 F (37.1 C), temperature source Oral, resp. rate 16, height 5\' 4"  (1.626 m), weight 65.2 kg, SpO2 100 %.   Intake/Output Summary (Last 24 hours) at 04/25/2020 1553 Last data filed at 04/25/2020 1500 Gross per 24 hour  Intake 2300 ml  Output 225 ml  Net 2075 ml   Exam Gen:- Awake Alert, no apparent distress,  HEENT:- [place].AT, No sclera icterus Neck-Supple Neck,No JVD,.  Lungs-improved air movement, no wheezing CV- S1, S2 normal, regular  Abd-  +ve B.Sounds, Abd Soft, No tenderness, bilateral Nephrostomy tubes Extremity/Skin:- No  edema, pedal pulses present  Psych-affect is appropriate, oriented x3 Neuro-Generalized weakness, no new focal deficits, no tremors   Data Review   CBC w Diff:  Lab Results  Component Value Date   WBC 8.5 04/25/2020   HGB 9.3 (L) 04/25/2020   HGB 14.4 10/26/2018   HCT 29.4 (L) 04/25/2020   HCT 43.2 10/26/2018   PLT 272 04/25/2020   PLT 187 10/26/2018   LYMPHOPCT 16 04/21/2020   MONOPCT 16 04/21/2020   EOSPCT 2 04/21/2020   BASOPCT 1 04/21/2020    CMP:  Lab Results  Component Value Date   NA 122 (L) 04/25/2020   NA 139 10/26/2018   K 5.3 (H) 04/25/2020   CL 87 (L) 04/25/2020   CO2 25 04/25/2020   BUN 25 (H) 04/25/2020   BUN 14 10/26/2018    CREATININE 0.99 04/25/2020   CREATININE 1.06 03/19/2020   PROT 6.1 (L) 04/21/2020   ALBUMIN 3.0 (L) 04/21/2020   BILITOT 0.5 04/21/2020   ALKPHOS 378 (H) 04/21/2020   AST 31 04/21/2020   ALT 51 (H) 04/21/2020  .   Total Discharge time is about 33 minutes  Roxan Hockey M.D on 04/25/2020 at 3:53 PM  Go to [email or web address] -  for contact info  [facility] - Office  [phone]

## 2020-04-25 NOTE — Discharge Instructions (Signed)
-- --   1)Avoid ibuprofen/Advil/Aleve/Motrin/Goody Powders/Naproxen/BC powders/Meloxicam/Diclofenac/Indomethacin and other Nonsteroidal anti-inflammatory medications as these will make you more likely to bleed and can cause stomach ulcers, can also cause Kidney problems.   2)Please follow-up with Interventional Radiology (IR) at [facility] for bilateral nephrostomy tube reevaluation in 1 to 2 weeks  3)Please follow- up with Dr. Delton Coombes (oncologist) in 2 to 3 weeks----   4)Please Repeat CBC and BMP in 4 to 6 days---  5)Take Eliquis/Apixaban for blood clot in your lungs as prescribed   Information on my medicine - ELIQUIS (apixaban)  This medication education was reviewed with me or my healthcare representative as part of my discharge preparation.  Why was Eliquis prescribed for you? Eliquis was prescribed to treat blood clots that may have been found in the veins of your legs (deep vein thrombosis) or in your lungs (pulmonary embolism) and to reduce the risk of them occurring again.  What do You need to know about Eliquis ? The starting dose is 10 mg (two 5 mg tablets) taken TWICE daily for the FIRST SEVEN (7) DAYS, then on (enter date)  05/01/2020 @ 10 PM  the dose is reduced to ONE 5 mg tablet taken TWICE daily.  Eliquis may be taken with or without food.   Try to take the dose about the same time in the morning and in the evening. If you have difficulty swallowing the tablet whole please discuss with your pharmacist how to take the medication safely.  Take Eliquis exactly as prescribed and DO NOT stop taking Eliquis without talking to the doctor who prescribed the medication.  Stopping may increase your risk of developing a new blood clot.  Refill your prescription before you run out.  After discharge, you should have regular check-up appointments with your healthcare provider that is prescribing your Eliquis.    What do you do if you miss a dose? If a dose of ELIQUIS is not  taken at the scheduled time, take it as soon as possible on the same day and twice-daily administration should be resumed. The dose should not be doubled to make up for a missed dose.  Important Safety Information A possible side effect of Eliquis is bleeding. You should call your healthcare provider right away if you experience any of the following: ? Bleeding from an injury or your nose that does not stop. ? Unusual colored urine (red or dark brown) or unusual colored stools (red or black). ? Unusual bruising for unknown reasons. ? A serious fall or if you hit your head (even if there is no bleeding).  Some medicines may interact with Eliquis and might increase your risk of bleeding or clotting while on Eliquis. To help avoid this, consult your healthcare provider or pharmacist prior to using any new prescription or non-prescription medications, including herbals, vitamins, non-steroidal anti-inflammatory drugs (NSAIDs) and supplements.  This website has more information on Eliquis (apixaban): http://www.eliquis.com/eliquis/home  --- -- 1)Avoid ibuprofen/Advil/Aleve/Motrin/Goody Powders/Naproxen/BC powders/Meloxicam/Diclofenac/Indomethacin and other Nonsteroidal anti-inflammatory medications as these will make you more likely to bleed and can cause stomach ulcers, can also cause Kidney problems.   2)Please follow-up with Interventional Radiology (IR) at [facility] for bilateral nephrostomy tube reevaluation in 1 to 2 weeks  3)Please follow- up with Dr. Delton Coombes (oncologist) in 2 to 3 weeks----   4)Please Repeat CBC and BMP in 4 to 6 days---  5)Take Eliquis/Apixaban for blood clot in your lungs as prescribed

## 2020-04-25 NOTE — TOC Transition Note (Signed)
Transition of Care [facility]) - CM/SW Discharge Note   Patient Details  Name: MONTAVIOUS WIERZBA MRN: [id number] Date of Birth: June 23, 1951  Transition [facility]) CM/SW Contact:  Shade Flood, LCSW Phone Number: 04/25/2020, 3:45 PM   Clinical Narrative:     Pt stable for dc today per MD. [place] to update on dc date of today. RNCM updated pt's start of SNF care to today with update due on 04/29/20. Updated Melissa at [facility] and they can take today. DC clinical will be sent electronically. EMS form printed to the floor. RN to call report and EMS when pt ready.  There are no other TOC needs for dc.  Final next level of care: Skilled Nursing Facility Barriers to Discharge: Barriers Resolved   Patient Goals and CMS Choice Patient states their goals for this hospitalization and ongoing recovery are:: Discharge home CMS Medicare.gov Compare Post Acute Care list provided to:: Patient Choice offered to / list presented to : Patient  Discharge Placement   Existing PASRR number confirmed : 04/22/20          Patient chooses bed at: [facility] Patient to be transferred to facility by: EMS Name of family member notified: pt only Patient and family notified of of transfer: 04/25/20  Discharge Plan and Services In-house Referral: Clinical Social Work Discharge Planning Services: NA Post Acute Care Choice: Home Health                    HH Arranged: RN, PT [facility] Agency: [place] Date [facility] Agency Contacted: 04/22/20 Time [place]: [phone] Representative spoke with at [place]: [place] ([place]) Interventions     Readmission Risk Interventions Readmission Risk Prevention Plan 04/22/2020 03/07/2020  Transportation Screening Complete Complete  PCP or Specialist Appt within 3-5 Days - Not Complete  Not Complete comments - Assessment completed during second shift  [place] or [place] - Complete  Social Work Consult  for [place] Planning/Counseling - Complete  Palliative Care Screening - Not Applicable  Medication Review Press photographer) Complete Complete  PCP or Specialist appointment within 3-5 days of discharge Complete -  [place] or Home Care Consult Complete -  SW Recovery Care/Counseling Consult Complete -  Palliative Care Screening Complete -  [place] Not Applicable -  Some recent data might be hidden

## 2020-04-26 LAB — URINE CULTURE: Culture: 60000 — AB

## 2020-04-29 ENCOUNTER — Ambulatory Visit (INDEPENDENT_AMBULATORY_CARE_PROVIDER_SITE_OTHER): Payer: Medicare Other | Admitting: Gastroenterology

## 2020-04-29 ENCOUNTER — Inpatient Hospital Stay: Payer: Medicare Other | Admitting: Family Medicine

## 2020-04-29 DIAGNOSIS — I509 Heart failure, unspecified: Secondary | ICD-10-CM | POA: Diagnosis not present

## 2020-04-29 DIAGNOSIS — C7919 Secondary malignant neoplasm of other urinary organs: Secondary | ICD-10-CM | POA: Diagnosis not present

## 2020-04-29 DIAGNOSIS — Z515 Encounter for palliative care: Secondary | ICD-10-CM

## 2020-04-29 DIAGNOSIS — N390 Urinary tract infection, site not specified: Secondary | ICD-10-CM | POA: Diagnosis not present

## 2020-04-29 DIAGNOSIS — A419 Sepsis, unspecified organism: Secondary | ICD-10-CM | POA: Diagnosis not present

## 2020-04-29 DIAGNOSIS — Z66 Do not resuscitate: Secondary | ICD-10-CM

## 2020-04-29 DIAGNOSIS — Z952 Presence of prosthetic heart valve: Secondary | ICD-10-CM | POA: Diagnosis not present

## 2020-05-01 DIAGNOSIS — I2510 Atherosclerotic heart disease of native coronary artery without angina pectoris: Secondary | ICD-10-CM

## 2020-05-01 DIAGNOSIS — C649 Malignant neoplasm of unspecified kidney, except renal pelvis: Secondary | ICD-10-CM | POA: Diagnosis not present

## 2020-05-01 DIAGNOSIS — D6959 Other secondary thrombocytopenia: Secondary | ICD-10-CM

## 2020-05-01 DIAGNOSIS — I5043 Acute on chronic combined systolic (congestive) and diastolic (congestive) heart failure: Secondary | ICD-10-CM | POA: Diagnosis not present

## 2020-05-01 DIAGNOSIS — B952 Enterococcus as the cause of diseases classified elsewhere: Secondary | ICD-10-CM

## 2020-05-01 DIAGNOSIS — C678 Malignant neoplasm of overlapping sites of bladder: Secondary | ICD-10-CM | POA: Diagnosis not present

## 2020-05-01 DIAGNOSIS — C419 Malignant neoplasm of bone and articular cartilage, unspecified: Secondary | ICD-10-CM

## 2020-05-01 DIAGNOSIS — N390 Urinary tract infection, site not specified: Secondary | ICD-10-CM

## 2020-05-01 DIAGNOSIS — Z436 Encounter for attention to other artificial openings of urinary tract: Secondary | ICD-10-CM | POA: Diagnosis not present

## 2020-05-01 DIAGNOSIS — I110 Hypertensive heart disease with heart failure: Secondary | ICD-10-CM | POA: Diagnosis not present

## 2020-05-02 ENCOUNTER — Other Ambulatory Visit (HOSPITAL_COMMUNITY): Payer: Medicare Other

## 2020-05-02 ENCOUNTER — Ambulatory Visit (HOSPITAL_COMMUNITY): Payer: Medicare Other | Admitting: Hematology

## 2020-05-02 ENCOUNTER — Ambulatory Visit (HOSPITAL_COMMUNITY): Payer: Medicare Other

## 2020-05-03 ENCOUNTER — Ambulatory Visit: Payer: Medicare Other | Admitting: Family Medicine

## 2020-05-08 DIAGNOSIS — C7919 Secondary malignant neoplasm of other urinary organs: Secondary | ICD-10-CM | POA: Diagnosis not present

## 2020-05-08 DIAGNOSIS — Z952 Presence of prosthetic heart valve: Secondary | ICD-10-CM | POA: Diagnosis not present

## 2020-05-08 DIAGNOSIS — I2699 Other pulmonary embolism without acute cor pulmonale: Secondary | ICD-10-CM | POA: Diagnosis not present

## 2020-05-08 DIAGNOSIS — I509 Heart failure, unspecified: Secondary | ICD-10-CM | POA: Diagnosis not present

## 2020-05-09 ENCOUNTER — Other Ambulatory Visit: Payer: Self-pay

## 2020-05-09 ENCOUNTER — Inpatient Hospital Stay (HOSPITAL_COMMUNITY): Payer: Medicare Other | Attending: Hematology

## 2020-05-09 ENCOUNTER — Inpatient Hospital Stay (HOSPITAL_COMMUNITY): Payer: Medicare Other

## 2020-05-09 ENCOUNTER — Inpatient Hospital Stay (HOSPITAL_BASED_OUTPATIENT_CLINIC_OR_DEPARTMENT_OTHER): Payer: Medicare Other | Admitting: Hematology

## 2020-05-09 ENCOUNTER — Encounter (HOSPITAL_COMMUNITY): Payer: Self-pay | Admitting: Hematology

## 2020-05-09 VITALS — BP 127/71 | HR 88 | Temp 97.3°F | Resp 18 | Wt 145.8 lb

## 2020-05-09 DIAGNOSIS — Z79899 Other long term (current) drug therapy: Secondary | ICD-10-CM | POA: Insufficient documentation

## 2020-05-09 DIAGNOSIS — R319 Hematuria, unspecified: Secondary | ICD-10-CM | POA: Diagnosis not present

## 2020-05-09 DIAGNOSIS — F410 Panic disorder [episodic paroxysmal anxiety] without agoraphobia: Secondary | ICD-10-CM | POA: Insufficient documentation

## 2020-05-09 DIAGNOSIS — Z09 Encounter for follow-up examination after completed treatment for conditions other than malignant neoplasm: Secondary | ICD-10-CM | POA: Diagnosis not present

## 2020-05-09 DIAGNOSIS — R531 Weakness: Secondary | ICD-10-CM | POA: Diagnosis not present

## 2020-05-09 DIAGNOSIS — C679 Malignant neoplasm of bladder, unspecified: Secondary | ICD-10-CM | POA: Diagnosis not present

## 2020-05-09 DIAGNOSIS — N1330 Unspecified hydronephrosis: Secondary | ICD-10-CM | POA: Diagnosis not present

## 2020-05-09 DIAGNOSIS — R0602 Shortness of breath: Secondary | ICD-10-CM | POA: Insufficient documentation

## 2020-05-09 DIAGNOSIS — Z836 Family history of other diseases of the respiratory system: Secondary | ICD-10-CM | POA: Diagnosis not present

## 2020-05-09 DIAGNOSIS — C7951 Secondary malignant neoplasm of bone: Secondary | ICD-10-CM | POA: Insufficient documentation

## 2020-05-09 DIAGNOSIS — Z87891 Personal history of nicotine dependence: Secondary | ICD-10-CM | POA: Diagnosis not present

## 2020-05-09 DIAGNOSIS — Z833 Family history of diabetes mellitus: Secondary | ICD-10-CM | POA: Diagnosis not present

## 2020-05-09 DIAGNOSIS — R112 Nausea with vomiting, unspecified: Secondary | ICD-10-CM | POA: Insufficient documentation

## 2020-05-09 DIAGNOSIS — C7910 Secondary malignant neoplasm of unspecified urinary organs: Secondary | ICD-10-CM

## 2020-05-09 DIAGNOSIS — R002 Palpitations: Secondary | ICD-10-CM | POA: Insufficient documentation

## 2020-05-09 DIAGNOSIS — R200 Anesthesia of skin: Secondary | ICD-10-CM | POA: Insufficient documentation

## 2020-05-09 DIAGNOSIS — M2550 Pain in unspecified joint: Secondary | ICD-10-CM | POA: Diagnosis not present

## 2020-05-09 DIAGNOSIS — Z8379 Family history of other diseases of the digestive system: Secondary | ICD-10-CM | POA: Diagnosis not present

## 2020-05-09 DIAGNOSIS — R079 Chest pain, unspecified: Secondary | ICD-10-CM | POA: Insufficient documentation

## 2020-05-09 DIAGNOSIS — M7989 Other specified soft tissue disorders: Secondary | ICD-10-CM | POA: Insufficient documentation

## 2020-05-09 DIAGNOSIS — I509 Heart failure, unspecified: Secondary | ICD-10-CM | POA: Insufficient documentation

## 2020-05-09 DIAGNOSIS — G479 Sleep disorder, unspecified: Secondary | ICD-10-CM | POA: Insufficient documentation

## 2020-05-09 DIAGNOSIS — J90 Pleural effusion, not elsewhere classified: Secondary | ICD-10-CM | POA: Diagnosis not present

## 2020-05-09 LAB — CBC WITH DIFFERENTIAL/PLATELET
Abs Immature Granulocytes: 0.22 10*3/uL — ABNORMAL HIGH (ref 0.00–0.07)
Basophils Absolute: 0.1 10*3/uL (ref 0.0–0.1)
Basophils Relative: 0 %
Eosinophils Absolute: 0.1 10*3/uL (ref 0.0–0.5)
Eosinophils Relative: 1 %
HCT: 32.6 % — ABNORMAL LOW (ref 39.0–52.0)
Hemoglobin: 10.6 g/dL — ABNORMAL LOW (ref 13.0–17.0)
Immature Granulocytes: 2 %
Lymphocytes Relative: 10 %
Lymphs Abs: 1.4 10*3/uL (ref 0.7–4.0)
MCH: 27.8 pg (ref 26.0–34.0)
MCHC: 32.5 g/dL (ref 30.0–36.0)
MCV: 85.6 fL (ref 80.0–100.0)
Monocytes Absolute: 1.3 10*3/uL — ABNORMAL HIGH (ref 0.1–1.0)
Monocytes Relative: 10 %
Neutro Abs: 10.6 10*3/uL — ABNORMAL HIGH (ref 1.7–7.7)
Neutrophils Relative %: 77 %
Platelets: 195 10*3/uL (ref 150–400)
RBC: 3.81 MIL/uL — ABNORMAL LOW (ref 4.22–5.81)
RDW: 26.1 % — ABNORMAL HIGH (ref 11.5–15.5)
WBC: 13.7 10*3/uL — ABNORMAL HIGH (ref 4.0–10.5)
nRBC: 0.4 % — ABNORMAL HIGH (ref 0.0–0.2)

## 2020-05-09 LAB — COMPREHENSIVE METABOLIC PANEL
ALT: 32 U/L (ref 0–44)
AST: 42 U/L — ABNORMAL HIGH (ref 15–41)
Albumin: 2.8 g/dL — ABNORMAL LOW (ref 3.5–5.0)
Alkaline Phosphatase: 655 U/L — ABNORMAL HIGH (ref 38–126)
Anion gap: 11 (ref 5–15)
BUN: 23 mg/dL (ref 8–23)
CO2: 21 mmol/L — ABNORMAL LOW (ref 22–32)
Calcium: 8.1 mg/dL — ABNORMAL LOW (ref 8.9–10.3)
Chloride: 91 mmol/L — ABNORMAL LOW (ref 98–111)
Creatinine, Ser: 1.01 mg/dL (ref 0.61–1.24)
GFR calc Af Amer: >60 mL/min (ref >60)
GFR calc non Af Amer: >60 mL/min (ref >60)
Glucose, Bld: 116 mg/dL — ABNORMAL HIGH (ref 70–99)
Potassium: 5.3 mmol/L — ABNORMAL HIGH (ref 3.5–5.1)
Sodium: 123 mmol/L — ABNORMAL LOW (ref 135–145)
Total Bilirubin: 0.9 mg/dL (ref 0.3–1.2)
Total Protein: 6.2 g/dL — ABNORMAL LOW (ref 6.5–8.1)

## 2020-05-09 LAB — MAGNESIUM: Magnesium: 2 mg/dL (ref 1.7–2.4)

## 2020-05-09 MED ORDER — SODIUM CHLORIDE 0.9% FLUSH
10.0000 mL | Freq: Once | INTRAVENOUS | Status: AC
  Administered 2020-05-09: 10 mL via INTRAVENOUS

## 2020-05-09 MED ORDER — HEPARIN SOD (PORK) LOCK FLUSH 100 UNIT/ML IV SOLN
500.0000 [IU] | Freq: Once | INTRAVENOUS | Status: AC
  Administered 2020-05-09: 500 [IU] via INTRAVENOUS

## 2020-05-09 NOTE — Progress Notes (Signed)
Patient's Port-a-cath flushed with good blood returned noted. No bruising, pain or swelling noted at site. Band-aid applied. Patient discharged with satisfactory condition with vital signs stable. Patient to follow up as requested by Oncologist.

## 2020-05-09 NOTE — Patient Instructions (Signed)
[place] at [facility]  Discharge Instructions:  Please review labs with primary physician for elevated potassium. _______________________________________________________________  Thank you for choosing [place] at [facility] to provide your oncology and hematology care.  To afford each patient quality time with our providers, please arrive at least 15 minutes before your scheduled appointment.  You need to re-schedule your appointment if you arrive 10 or more minutes late.  We strive to give you quality time with our providers, and arriving late affects you and other patients whose appointments are after yours.  Also, if you no show three or more times for appointments you may be dismissed from the clinic.  Again, thank you for choosing [place] at [place] hope is that these requests will allow you access to exceptional care and in a timely manner. _______________________________________________________________  If you have questions after your visit, please contact our office at [phone] between the hours of 8:30 a.m. and 5:00 p.m. Voicemails left after 4:30 p.m. will not be returned until the following business day. _______________________________________________________________  For prescription refill requests, have your pharmacy contact our office. _______________________________________________________________  Recommendations made by the consultant and any test results will be sent to your referring physician. _______________________________________________________________

## 2020-05-09 NOTE — Progress Notes (Signed)
[place], [place] [address]   CLINIC:  Medical Oncology/Hematology  PCP:  [name], MD [address] [place] / [place] [address] [phone]   REASON FOR VISIT:  Follow-up for metastatic bladder cancer  PRIOR THERAPY: None  NGS Results: PD-L1 CPS 1%, Foundation 1 MS--stable, TMB 20 Muts/Mb  CURRENT THERAPY: Carboplatin and gemcitabine  BRIEF ONCOLOGIC HISTORY:  Oncology History  Metastatic urothelial carcinoma ([place])  02/13/2020 Initial Diagnosis   Metastatic urothelial carcinoma ([place])   02/23/2020 Genetic Testing   PD-L1      02/29/2020 Genetic Testing   Foundation One     03/04/2020 -  Chemotherapy   The patient had palonosetron (ALOXI) injection 0.25 mg, 0.25 mg, Intravenous,  Once, 2 of 4 cycles Administration: 0.25 mg (03/04/2020), 0.25 mg (03/28/2020) CARBOplatin (PARAPLATIN) 400 mg in sodium chloride 0.9 % 250 mL chemo infusion, 400 mg (100 % of original dose 397 mg), Intravenous,  Once, 2 of 4 cycles Dose modification:   (original dose 397 mg, Cycle 1), 397 mg (original dose 397 mg, Cycle 2) Administration: 400 mg (03/04/2020), 400 mg (03/28/2020) gemcitabine (GEMZAR) 1,558 mg in sodium chloride 0.9 % 250 mL chemo infusion, 1,000 mg/m2 = 1,558 mg, Intravenous,  Once, 2 of 4 cycles Dose modification: 800 mg/m2 (original dose 1,000 mg/m2, Cycle 2, Reason: Provider Judgment) Administration: 1,558 mg (03/04/2020), 1,254 mg (03/28/2020), 1,254 mg (04/04/2020) fosaprepitant (EMEND) 150 mg in sodium chloride 0.9 % 145 mL IVPB, 150 mg, Intravenous,  Once, 2 of 4 cycles Administration: 150 mg (03/04/2020), 150 mg (03/28/2020)  for chemotherapy treatment.      CANCER STAGING: Cancer Staging No matching staging information was found for the patient.  INTERVAL HISTORY:  [name], a 69 y.o. male, returns for hospital follow-up and routine follow-up and consideration for next cycle of chemotherapy. [name] was  last seen on 04/04/2020. [name] was admitted on 7/23 to [facility] for heart failure and discharged on 7/28. [name] was later admitted to [facility] from 8/1 to 8/5 for pleural effusion and PE and prescribed Eliquis.  Due for cycle #3 of carboplatin and gemcitabine today.   Today [name] is accompanied by his nurse. [name] reports that [name] did not get thoracentesis and was just put on Lasix; [name] is urinating well with the Lasix. [name] currently feels weak and can only walk with a walker for 10 paces before needing to rest. [name] denies having any new pains. [name] reports having nausea and vomiting whenever [name] tries to eat anything; the last vomitus was this morning upon waking up prior to eating. [name] is on a low sodium diet in the nursing home. [name] denies having a fever currently.  [name] is currently staying at [facility] for rehabilitation, doing exercises daily to every other day depending on his energy levels.   REVIEW OF SYSTEMS:  Review of Systems  Constitutional: Positive for appetite change (moderately decreased) and fatigue (severe). Negative for fever.  Respiratory: Positive for shortness of breath.   Cardiovascular: Positive for chest pain, leg swelling and palpitations.  Gastrointestinal: Positive for nausea and vomiting.  Genitourinary: Positive for hematuria.   Musculoskeletal: Positive for arthralgias (9/10 joints pain in knees and shoulders).  Neurological: Positive for numbness (L arm).  Psychiatric/Behavioral: Positive for sleep disturbance.  All other systems reviewed and are negative.   PAST MEDICAL/SURGICAL HISTORY:  Past Medical History:  Diagnosis Date  . Arterial embolus of lower extremity ([place]) 11/23/2013  .  Arthritis   . CAD (coronary artery disease)    a. s/p PCI with 3.5 x 18 BMS to mid RCA, non obst LAD, Lcx patent 08/10/13  . Cancer ([place]) 2021   bladder  . Depression, major, single episode, mild ([place]) 12/20/2019  . ED (erectile dysfunction) 03/27/2015  . Embolus of femoral artery [facility]) 2012    Surgical repair, previously on Coumadin  . Essential hypertension   . ETOH abuse   . Hematuria 10/17/2019  . Hernia of abdominal cavity 02/27/2019  . History of DVT (deep vein thrombosis) 2011  . History of GI bleed    February 2012   . Ingrown left greater toenail 09/04/2018  . 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 Low oxygen saturation 01/31/2020  . 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 Pain with urination 10/17/2019  . Port-A-Cath in place 02/23/2020   right  . Prosthetic valve endocarditis ([place])   . PVD (peripheral vascular disease) ([place])   . S/P aortic valve replacement    Details not clear -  reportedly 1971 per patient   Past Surgical History:  Procedure Laterality Date  . ABDOMINAL AORTOGRAM W/LOWER EXTREMITY N/A 10/31/2018   Procedure: ABDOMINAL AORTOGRAM W/LOWER EXTREMITY;  Surgeon: Lorretta Harp, MD;  Location: [place] CV LAB;  Service: Cardiovascular;  Laterality: N/A;  . Aoric valve replacement  January 2005   84m Medtronic Freestyle stentless porcine bioprosthesis ([facility]  . CARDIAC CATHETERIZATION     30-40% mid LAD, 20-30% ostial D1, proximal 30-40% RCA, 95% mid RCA thrombus, 40% distal stenosis. EF 45%, inferior HK.  .Marland KitchenCOLONOSCOPY N/A 04/20/2014   Procedure: COLONOSCOPY;  Surgeon: NRogene Houston MD;  [facility];  Service: Endoscopy;  Laterality: N/A;  930  . COLONOSCOPY WITH PROPOFOL N/A 02/02/2020   Procedure: COLONOSCOPY WITH PROPOFOL;  Surgeon: RRogene Houston MD;  [facility];  Service: Endoscopy;  Laterality: N/A;  . CORONARY ANGIOPLASTY WITH STENT PLACEMENT  07/2013  . ESOPHAGOGASTRODUODENOSCOPY (EGD) WITH PROPOFOL N/A 02/02/2020   Procedure: ESOPHAGOGASTRODUODENOSCOPY (EGD) WITH PROPOFOL;  Surgeon: RRogene Houston MD;  [facility];  Service: Endoscopy;  Laterality: N/A;  . INGUINAL HERNIA REPAIR Bilateral 04/17/2019   Procedure:  ATTEMPTED LAPAROSCOPIC BILATERAL INGUINAL HERNIA REPAIR;  Surgeon: BCoralie Keens MD;  Location[place] [facility]  Service: General;  Laterality: Bilateral;  . INGUINAL HERNIA REPAIR Bilateral 04/17/2019   Procedure: Hernia Repair Inguinal Adult;  Surgeon: BCoralie Keens MD;  [facility]  Service: General;  Laterality: Bilateral;  . IR IMAGING GUIDED PORT INSERTION  02/22/2020  . IR NEPHROSTOMY EXCHANGE LEFT  04/05/2020  . IR NEPHROSTOMY EXCHANGE RIGHT  04/05/2020  . IR NEPHROSTOMY PLACEMENT LEFT  02/22/2020  . IR NEPHROSTOMY PLACEMENT RIGHT  02/22/2020  . LEFT HEART CATHETERIZATION WITH CORONARY ANGIOGRAM N/A 08/10/2013   Procedure: LEFT HEART CATHETERIZATION WITH CORONARY ANGIOGRAM;  Surgeon: MBlane Ohara MD;  [facility];  Service: Cardiovascular;  Laterality: N/A;  . Left leg embolectomy  May 2012   Dr. BTrula Slade- reason for lifelong coumadin  . LYMPH NODE BIOPSY Left 11/21/2019   Procedure: Left Neck LYMPH NODE BIOPSY;  Surgeon: NRozetta Nunnery MD;  [place] [facility]  Service: ENT;  Laterality: Left;  . PERIPHERAL VASCULAR ATHERECTOMY  10/31/2018   Procedure: PERIPHERAL VASCULAR ATHERECTOMY;  Surgeon: BLorretta Harp MD;  [facility];  Service:  Cardiovascular;;  left SFA  . POLYPECTOMY  02/02/2020   Procedure: POLYPECTOMY;  Surgeon: Rogene Houston, MD;  [facility];  Service: Endoscopy;;  splenic flexure    SOCIAL HISTORY:  Social History   Socioeconomic History  . Marital status: Single    Spouse name: Not on file  . Number of children: 2  . Years of education: Not on file  . Highest education level: 11th grade  Occupational History  . Occupation: disability  Tobacco Use  . Smoking status: Former Smoker    Packs/day: 0.50    Years: 3.00    Pack years: 1.50    Types: Cigarettes    Start date: 10/12/1965    Quit date: 02/19/1970    Years since quitting: 50.2  . Smokeless tobacco: Never Used  Vaping Use  . Vaping Use: Never used    Substance and Sexual Activity  . Alcohol use: Not Currently  . Drug use: Yes    Types: Marijuana    Comment: pt smokes weed once in the morning and once in the evening  . Sexual activity: Not Currently  Other Topics Concern  . Not on file  Social History Narrative  . Not on file   Social Determinants of Health   Financial Resource Strain:   . Difficulty of Paying Living Expenses: Not on file  Food Insecurity:   . Worried About Charity fundraiser in the Last Year: Not on file  . Ran Out of Food in the Last Year: Not on file  Transportation Needs:   . Lack of Transportation (Medical): Not on file  . Lack of Transportation (Non-Medical): Not on file  Physical Activity:   . Days of Exercise per Week: Not on file  . Minutes of Exercise per Session: Not on file  Stress:   . Feeling of Stress : Not on file  Social Connections:   . Frequency of Communication with Friends and Family: Not on file  . Frequency of Social Gatherings with Friends and Family: Not on file  . Attends Religious Services: Not on file  . Active Member of Clubs or Organizations: Not on file  . Attends Archivist Meetings: Not on file  . Marital Status: Not on file  Intimate Partner Violence:   . Fear of Current or Ex-Partner: Not on file  . Emotionally Abused: Not on file  . Physically Abused: Not on file  . Sexually Abused: Not on file    FAMILY HISTORY:  Family History  Problem Relation Age of Onset  . Diabetes Mother   . Cirrhosis Father   . COPD Sister     CURRENT MEDICATIONS:  Current Outpatient Medications  Medication Sig Dispense Refill  . acetaminophen (TYLENOL) 325 MG tablet Take 2 tablets (650 mg total) by mouth every 6 (six) hours as needed for mild pain or moderate pain. 30 tablet 1  . Apixaban Starter Pack, 16m and 5819m (ELIQUIS DVT/PE STARTER PACK) Take as directed on package: start with two-19m52mablets twice daily for 7 days. On day 8, switch to one-19mg619mblet twice daily.  1 each 0  . aspirin EC 81 MG tablet Take 1 tablet (81 mg total) by mouth daily. 90 tablet 3  . atorvastatin (LIPITOR) 40 MG tablet Take 1 tablet (40 mg total) by mouth daily. 90 tablet 1  . busPIRone (BUSPAR) 10 MG tablet Take 1 tablet (10 mg total) by mouth 2 (two) times daily. 60 tablet 2  . cholecalciferol (VITAMIN D)  1000 units tablet Take 1 tablet (1,000 Units total) by mouth daily. 90 tablet 3  . clonazePAM (KLONOPIN) 0.5 MG tablet Take 1 tablet (0.5 mg total) by mouth at bedtime. 10 tablet 0  . escitalopram (LEXAPRO) 10 MG tablet Take 1 tablet (10 mg total) by mouth daily. 30 tablet 1  . fluticasone (FLONASE) 50 MCG/ACT nasal spray Use 2 spray(s) in each nostril once daily (Patient taking differently: Place 2 sprays into both nostrils daily. ) 16 g 0  . furosemide (LASIX) 20 MG tablet Take 1 tablet (20 mg total) by mouth daily. 30 tablet 1  . lisinopril (ZESTRIL) 5 MG tablet Take 1 tablet (5 mg total) by mouth daily. 30 tablet 1  . magnesium oxide (MAG-OX) 400 (241.3 Mg) MG tablet Take 1 tablet (400 mg total) by mouth 2 (two) times daily. 60 tablet 2  . metoprolol succinate (TOPROL-XL) 50 MG 24 hr tablet TAKE 1 TABLET BY MOUTH ONCE DAILY WITH  OR  IMMEDIATELY  FOLLOWING  A  MEAL (Patient taking differently: Take 50 mg by mouth daily. WITH  OR  IMMEDIATELY  FOLLOWING  A  MEAL) 90 tablet 0  . montelukast (SINGULAIR) 10 MG tablet Take 1 tablet (10 mg total) by mouth at bedtime. 90 tablet 0  . nitroGLYCERIN (NITROSTAT) 0.4 MG SL tablet Place 1 tablet (0.4 mg total) under the tongue every 5 (five) minutes as needed for chest pain. 25 tablet 0  . oxyCODONE (OXY IR/ROXICODONE) 5 MG immediate release tablet Take 1 tablet (5 mg total) by mouth every 4 (four) hours as needed for moderate pain or severe pain. 12 tablet 0  . pantoprazole (PROTONIX) 40 MG tablet Take 1 tablet (40 mg total) by mouth daily before breakfast. 30 tablet 2  . potassium chloride (KLOR-CON) 10 MEQ tablet Take 1 tablet (10 mEq  total) by mouth every other day. (Patient taking differently: Take 10 mEq by mouth daily. ) 15 tablet 0  . prochlorperazine (COMPAZINE) 10 MG tablet Take 1 tablet (10 mg total) by mouth every 6 (six) hours as needed (Nausea or vomiting). 60 tablet 3  . senna-docusate (SENOKOT-S) 8.6-50 MG tablet Take 2 tablets by mouth at bedtime. 60 tablet 1  . spironolactone (ALDACTONE) 25 MG tablet Take 0.5 tablets (12.5 mg total) by mouth daily. 30 tablet 1  . trolamine salicylate (ASPERCREME) 10 % cream Apply 1 application topically daily as needed for muscle pain (for pain in feet). For pain in legs (swelling)     No current facility-administered medications for this visit.    ALLERGIES:  No Known Allergies  PHYSICAL EXAM:  Performance status (ECOG): 1 - Symptomatic but completely ambulatory  Vitals:   05/09/20 0820  BP: 127/71  Pulse: 88  Resp: 18  Temp: (!) 97.3 F (36.3 C)  SpO2: 96%   Wt Readings from Last 3 Encounters:  05/09/20 145 lb 12.8 oz (66.1 kg)  04/25/20 143 lb 11.8 oz (65.2 kg)  04/17/20 132 lb 15 oz (60.3 kg)   Physical Exam Vitals reviewed.  Constitutional:      Appearance: Normal appearance.  Cardiovascular:     Rate and Rhythm: Normal rate and regular rhythm.     Pulses: Normal pulses.     Heart sounds: Normal heart sounds.  Pulmonary:     Effort: Pulmonary effort is normal.     Breath sounds: Examination of the right-lower field reveals decreased breath sounds. Decreased breath sounds present.  Chest:     Comments: Port-a-Cath in R chest  Musculoskeletal:     Right lower leg: Edema (1+) present.     Left lower leg: Edema (1+) present.  Neurological:     General: No focal deficit present.     Mental Status: [name] is alert and oriented to person, place, and time.  Psychiatric:        Mood and Affect: Mood normal.        Behavior: Behavior normal.     LABORATORY DATA:  I have reviewed the labs as listed.  CBC Latest Ref Rng & Units 05/09/2020 04/25/2020 04/24/2020   WBC 4.0 - 10.5 K/uL 13.7(H) 8.5 8.0  Hemoglobin 13.0 - 17.0 g/dL 10.6(L) 9.3(L) 9.5(L)  Hematocrit 39 - 52 % 32.6(L) 29.4(L) 29.9(L)  Platelets 150 - 400 K/uL 195 272 314   CMP Latest Ref Rng & Units 05/09/2020 04/25/2020 04/24/2020  Glucose 70 - 99 mg/dL 116(H) 87 84  BUN 8 - 23 mg/dL 23 25(H) 24(H)  Creatinine 0.61 - 1.24 mg/dL 1.01 0.99 0.87  Sodium 135 - 145 mmol/L 123(L) 122(L) 124(L)  Potassium 3.5 - 5.1 mmol/L 5.3(H) 5.3(H) 4.9  Chloride 98 - 111 mmol/L 91(L) 87(L) 88(L)  CO2 22 - 32 mmol/L 21(L) 25 25  Calcium 8.9 - 10.3 mg/dL 8.1(L) 8.2(L) 8.2(L)  Total Protein 6.5 - 8.1 g/dL 6.2(L) - -  Total Bilirubin 0.3 - 1.2 mg/dL 0.9 - -  Alkaline Phos 38 - 126 U/L 655(H) - -  AST 15 - 41 U/L 42(H) - -  ALT 0 - 44 U/L 32 - -    DIAGNOSTIC IMAGING:  I have independently reviewed the scans and discussed with the patient. DG Chest 2 View  Result Date: 04/12/2020 CLINICAL DATA:  Short of breath EXAM: CHEST - 2 VIEW COMPARISON:  03/06/2020, 02/12/2020 FINDINGS: Right-sided central venous port tip over the SVC. Post sternotomy changes with valve prostheses. Cardiomegaly. Small right greater than left pleural effusion. Scattered foci of sclerosis at the ribs and spine consistent with metastatic disease. Incompletely visualized pigtail catheter in the right upper quadrant. IMPRESSION: Cardiomegaly with small right greater than left pleural effusions. Scattered sclerotic foci overlying the ribs and spine, likely corresponding to known skeletal metastatic disease. Electronically Signed   By: Donavan Foil M.D.   On: 04/12/2020 21:47   CT Angio Chest PE W and/or Wo Contrast  Result Date: 04/21/2020 CLINICAL DATA:  Increasing shortness of breath, history of bladder cancer, DVT EXAM: CT ANGIOGRAPHY CHEST WITH CONTRAST TECHNIQUE: Multidetector CT imaging of the chest was performed using the standard protocol during bolus administration of intravenous contrast. Multiplanar CT image reconstructions and MIPs  were obtained to evaluate the vascular anatomy. CONTRAST:  51m OMNIPAQUE IOHEXOL 350 MG/ML SOLN COMPARISON:  CT 02/02/2020 FINDINGS: Cardiovascular: Satisfactory opacification of the pulmonary arteries to the segmental level. Suspect filling defects within the segmental branches of the posterior basal segment right lower lobe (5/190) though difficult to fully assess given hypoventilatory changes/passive atelectasis in this region as well as some respiratory motion artifact. No other convincing pulmonary arterial filling defects additional peripherally marginated segmental filling defects seen in the anterior segmental branches left upper lobe (5/79) and lateral basal segmental branch of the left lower lobe (5/172). Central pulmonary arteries remain normal caliber. Diffuse cardiomegaly with biatrial enlargement and reflux of contrast into the IVC and hepatic veins. Transcatheter aortic valve replacement is in position. Coronary artery calcifications are present. Rib right IJ Port-A-Cath tip terminates at the level of the superior cavoatrial junction. No other major venous abnormalities. Mediastinum/Nodes: Postsurgical  changes from prior sternotomy. No mediastinal fluid or gas. Normal thyroid gland and thoracic inlet. No acute abnormality of the trachea or esophagus. No worrisome mediastinal, hilar or axillary adenopathy. Lungs/Pleura: Moderate right pleural effusion with subtotal atelectatic collapse of the right lower lobe with further subsegmental atelectatic changes in the right upper and middle lobes as well. Stable biapical pleuroparenchymal scarring and blebs with calcified right apical granuloma. Upper Abdomen: Coil pack seen in the central liver. Bilateral percutaneous nephrostomies. Small volume upper abdominal ascites. Musculoskeletal: Diffuse body wall edema. New extensive multifocal areas of sclerotic osseous metastatic disease throughout the axial skeleton, sternum and ribs as well as the bilateral  scapula and right humerus. No acute fracture. Postsurgical changes from prior sternotomy with intact sternal sutures and bony fusion across the sternum. No worrisome chest wall lesions. Review of the MIP images confirms the above findings. IMPRESSION: 1. Segmental and subsegmental filling defects in the posterior basal segment right lower lobe, anterior segment left upper lobe and basal segmental left lower lobe. 2. Cardiomegaly with biatrial enlargement and reflux of contrast into the IVC and hepatic veins, suggestive of elevated right heart pressure/right heart failure. 3. Moderate right pleural effusion with subtotal atelectatic collapse of the right lower lobe with further subsegmental atelectatic changes in the right upper and middle lobes as well. Possibly related to volume status though malignant effusion is not excluded. 4. Additional features of anasarca with circumferential body wall edema and upper abdominal ascites. 5. Extensive new multifocal areas of sclerotic osseous metastatic disease. 6. Prior transcatheter aortic valve replacement as well as sternotomy. 7. Coronary artery atherosclerosis. 8. Bilateral percutaneous nephrostomies. 9. Aortic Atherosclerosis (ICD10-I70.0). Electronically Signed   By: Lovena Le M.D.   On: 04/21/2020 16:18   CT ABDOMEN PELVIS W CONTRAST  Result Date: 04/12/2020 CLINICAL DATA:  Abdomen pain possible biliary obstruction bilateral leg swelling EXAM: CT ABDOMEN AND PELVIS WITH CONTRAST TECHNIQUE: Multidetector CT imaging of the abdomen and pelvis was performed using the standard protocol following bolus administration of intravenous contrast. CONTRAST:  124m OMNIPAQUE IOHEXOL 300 MG/ML  SOLN COMPARISON:  CT 03/06/2020, 02/02/2020 FINDINGS: Lower chest: Lung bases demonstrate new small moderate right pleural effusion. Marked cardiomegaly with multi chamber enlargement. Hepatobiliary: Mild loculated fluid along the anterior aspect of the liver. No focal hepatic  abnormality. No calcified gallstone or biliary dilatation. Embolization coils at the porta hepatis. Pancreas: Unremarkable. No pancreatic ductal dilatation or surrounding inflammatory changes. Spleen: Unchanged lobulated spleen with coarse calcification. Adrenals/Urinary Tract: Adrenal glands are within normal limits. Bilateral nephrostomy tubes remain in place. No significant hydronephrosis. Cortical scarring within both kidneys. Slightly dense exophytic lesion off the upper pole right kidney measuring 2.3 x 1.4 cm, previously 2.3 x 1.5 cm. Eccentric right bladder mass measures slightly smaller today, 7 cm AP x 5 cm transverse compared with 8.4 x 4.5 cm previously. Possible right seminal vesicle tumor invasion as before. Stomach/Bowel: The stomach is decompressed. No dilated small bowel. No bowel wall thickening. Vascular/Lymphatic: Tortuous aorta with atherosclerosis. No aneurysm. Decreased retroperitoneal and pelvic adenopathy. For example left Peri aortic lymph node measures 8 mm compared with 11 mm previously, series 2, image number 19. Right external iliac node now measures 17 mm, compared with 3.7 cm previously. Reproductive: Prostate enlarged. 2.4 cm relative area of hyperenhancement within the left posterior gland. Other: No free air. Extensive subcutaneous edema consistent with anasarca. Large bilateral scrotal fluid collections. Musculoskeletal: Multiple sclerotic foci within the pelvic bones, spine and ribs consistent with osseous metastatic disease. Some  of the lesions appear increased in size with more dense sclerosis, for example left inferior pubic ramus lesion measures 22 mm compared with 20 mm previously. Increased size of sclerotic lesions within the lumbar vertebra. IMPRESSION: 1. Negative for calcified gallstone or biliary dilatation. 2. Extensive subcutaneous edema consistent with generalized anasarca with large bilateral scrotal fluid collections. Cardiomegaly with small moderate right pleural  effusion. 3. Slight interval decrease in size of eccentric right bladder mass. Decreased retroperitoneal and pelvic adenopathy. New small amount loculated right perihepatic fluid. 4. Slight interval decrease in size of eccentric right bladder mass with possible invasion of right seminal vesicle. Decreased retroperitoneal and pelvic adenopathy compared to most recent prior. 5. Stable positioning of bilateral nephrostomy tubes without hydronephrosis 6. Stable slightly dense exophytic lesion off the upper pole of right kidney 7. New focal area of hyperenhancement within the left posterior prostate gland, indeterminate for a prostate mass. 8. Diffuse skeletal metastatic disease. Interval enlargement of multiple lesions involving the spine and pelvis. Aortic Atherosclerosis (ICD10-I70.0). Electronically Signed   By: Donavan Foil M.D.   On: 04/12/2020 23:49   DG Chest Port 1 View  Result Date: 04/21/2020 CLINICAL DATA:  Increasing shortness of breath since last night. EXAM: PORTABLE CHEST 1 VIEW COMPARISON:  Radiograph 04/12/2020.  CT 02/02/2020 FINDINGS: Accessed right chest port in place. Post median sternotomy. Stable cardiomegaly and prosthetic aortic valve/root. There is new volume loss at the right lung base with ill-defined opacity. Minimal fluid in the right minor fissure versus adjacent atelectasis. The left lung is clear. There is no pulmonary edema. No pneumothorax. Blunting of the right costophrenic angle may be chronic or small effusion. IMPRESSION: 1. New volume loss at the right lung base with ill-defined opacity. This may represent atelectasis, pneumonia or potentially aspiration in the appropriate clinical setting. Possible small right pleural effusion. 2. Stable cardiomegaly. Electronically Signed   By: Keith Rake M.D.   On: 04/21/2020 15:03   ECHOCARDIOGRAM COMPLETE  Result Date: 04/23/2020    ECHOCARDIOGRAM REPORT   Patient Name:   SAVALAS MONJE Date of Exam: 04/23/2020 Medical Rec #:   [id number]        Height:       64.0 in Accession #:    [id number]       Weight:       137.1 lb Date of Birth:  04-21-1951        BSA:          1.666 m Patient Age:    80 years         BP:           109/67 mmHg Patient Gender: M                HR:           75 bpm. Exam Location:  [facility] Procedure: 2D Echo, Cardiac Doppler and Color Doppler Indications:    Pulmonary Embolus 415.19 / I26.99  History:        Patient has prior history of Echocardiogram examinations, most                 recent 01/05/2020. CHF; Risk Factors:Hypertension. History of DVT                 (deep vein thrombosis) (Resolved), Alcohol abuse (Resolved), HX                 OF AORTIC VALVE REPLACEMENT, Cancer from history.  Aortic Valve: 26 mm Edwards Sapien prosthetic, stented (TAVR)                 valve is present in the aortic position.  Sonographer:    Alvino Chapel RCS Referring Phys: [phone] COURAGE EMOKPAE IMPRESSIONS  1. Left ventricular ejection fraction, by estimation, is 25 to 30%. The left ventricle has severely decreased function. The left ventricle demonstrates global hypokinesis. The left ventricular internal cavity size was moderately dilated. There is mild left ventricular hypertrophy. Left ventricular diastolic parameters are indeterminate.  2. Right ventricular systolic function is severely reduced. The right ventricular size is mildly enlarged. There is moderately elevated pulmonary artery systolic pressure. The estimated right ventricular systolic pressure is 15.1 mmHg.  3. Left atrial size was severely dilated.  4. Right atrial size was severely dilated.  5. The mitral valve is abnormal. Mild to moderate mitral valve regurgitation.  6. The aortic valve has been repaired/replaced. Aortic valve regurgitation is trivial. There is a 26 mm Edwards Sapien prosthetic (TAVR) valve present in the aortic position (patient has valve in valve TAVR with previous bioprosthetic AVR). Aortic valve  mean gradient measures 8.0  mmHg. There is a moderate paravalvular leak at 9 o'clock position.  7. The inferior vena cava is dilated in size with <50% respiratory variability, suggesting right atrial pressure of 15 mmHg. FINDINGS  Left Ventricle: Left ventricular ejection fraction, by estimation, is 25 to 30%. The left ventricle has severely decreased function. The left ventricle demonstrates global hypokinesis. The left ventricular internal cavity size was moderately dilated. There is mild left ventricular hypertrophy. Left ventricular diastolic parameters are indeterminate. Right Ventricle: The right ventricular size is mildly enlarged. No increase in right ventricular wall thickness. Right ventricular systolic function is severely reduced. There is moderately elevated pulmonary artery systolic pressure. The tricuspid regurgitant velocity is 2.89 m/s, and with an assumed right atrial pressure of 15 mmHg, the estimated right ventricular systolic pressure is 76.1 mmHg. Left Atrium: Left atrial size was severely dilated. Right Atrium: Right atrial size was severely dilated. Pericardium: There is no evidence of pericardial effusion. Mitral Valve: The mitral valve is abnormal. There is moderate thickening of the mitral valve leaflet(s). Mild to moderate mitral valve regurgitation. Tricuspid Valve: The tricuspid valve is grossly normal. Tricuspid valve regurgitation is mild. Aortic Valve: The aortic valve has been repaired/replaced. Aortic valve regurgitation is trivial. Aortic regurgitation PHT measures 354 msec. Aortic valve mean gradient measures 8.0 mmHg. Aortic valve peak gradient measures 16.5 mmHg. Aortic valve area, by VTI measures 1.48 cm. There is a 26 mm Edwards Sapien prosthetic, stented (TAVR) valve present in the aortic position. Pulmonic Valve: The pulmonic valve was grossly normal. Pulmonic valve regurgitation is mild to moderate. Aorta: The aortic root is normal in size and structure. Venous: The inferior vena cava is dilated in  size with less than 50% respiratory variability, suggesting right atrial pressure of 15 mmHg. IAS/Shunts: No atrial level shunt detected by color flow Doppler.  LEFT VENTRICLE PLAX 2D LVIDd:         6.63 cm      Diastology LVIDs:         5.69 cm      LV e' lateral:   11.00 cm/s LV PW:         1.24 cm      LV E/e' lateral: 9.7 LV IVS:        1.20 cm LVOT diam:     2.00 cm LV SV:  75 LV SV Index:   34 LVOT Area:     3.14 cm  LV Volumes (MOD) LV vol d, MOD A2C: 273.0 ml LV vol d, MOD A4C: 235.0 ml LV vol s, MOD A2C: 175.0 ml LV vol s, MOD A4C: 166.0 ml LV SV MOD A2C:     98.0 ml LV SV MOD A4C:     235.0 ml LV SV MOD BP:      84.4 ml RIGHT VENTRICLE RV S prime:     7.84 cm/s TAPSE (M-mode): 1.5 cm LEFT ATRIUM              Index        RIGHT ATRIUM           Index LA diam:        4.40 cm  2.64 cm/m   RA Area:     31.80 cm LA Vol (A2C):   176.0 ml 105.61 ml/m RA Volume:   123.00 ml 73.81 ml/m LA Vol (A4C):   111.0 ml 66.61 ml/m LA Biplane Vol: 144.0 ml 86.41 ml/m  AORTIC VALVE AV Area (Vmax):    1.56 cm AV Area (Vmean):   1.61 cm AV Area (VTI):     1.48 cm AV Vmax:           203.00 cm/s AV Vmean:          127.000 cm/s AV VTI:            0.389 m AV Peak Grad:      16.5 mmHg AV Mean Grad:      8.0 mmHg LVOT Vmax:         101.00 cm/s LVOT Vmean:        64.900 cm/s LVOT VTI:          0.183 m LVOT/AV VTI ratio: 0.47 AI PHT:            354 msec  AORTA Ao Root diam: 3.30 cm MITRAL VALVE                 TRICUSPID VALVE MV Area (PHT): 4.89 cm      TR Peak grad:   33.4 mmHg MV Decel Time: 155 msec      TR Vmax:        289.00 cm/s MR Peak grad:    90.6 mmHg MR Mean grad:    50.0 mmHg   SHUNTS MR Vmax:         476.00 cm/s Systemic VTI:  0.18 m MR Vmean:        318.0 cm/s  Systemic Diam: 2.00 cm MR PISA:         1.57 cm MR PISA Eff ROA: 11 mm MR PISA Radius:  0.50 cm MV E velocity: 107.00 cm/s Rozann Lesches MD Electronically signed by Rozann Lesches MD Signature Date/Time: 04/23/2020/5:43:09 PM    Final       ASSESSMENT:  1. Metastatic urothelial carcinoma: -Left supraclavicular lymph node biopsy on 11/21/2019 showed metastatic urothelial carcinoma. -PET scan on 10/30/2019 showed right bladder mass with right hydroureteronephrosis. Pelvic, retroperitoneal and left supraclavicular adenopathy. -CT CAP on 02/02/2020 shows progression of bladder wall thickening, increased retroperitoneal and pelvic adenopathy. Interval development of multiple faint foci of sclerosis involving ribs, spine, pelvic bones consistent with bone meta stasis. 2.7 cm exophytic right kidney mass but PET negative. Small amount of ascites. -PD-L1 CPS 1%.Foundation 1 with TMB more than 10 Muts/Mb, MS-stable. -Cycle 1 day 1 of gemcitabine and carboplatin on 03/04/2020. -  Hospitalization with urosepsis after cycle 1. -Cycle 2 of gemcitabine and carboplatin on 03/28/2020. -[name] had couple of hospitalizations after that. -CTAP on 04/12/2020 showed slight interval decrease in right bladder mass, decreased retroperitoneal and pelvic adenopathy.   2. Bone metastasis: -Recent CT showed multiple foci of sclerosis involving ribs, spine, pelvic bones consistent with metastatic disease. -We will consider denosumab after first cycle.   PLAN:  1. Metastatic urothelial carcinoma: -[name] had recurrent hospitalizations x2 after cycle 2. -[name] is clearly not able to tolerate chemotherapy. -[name] is currently at rehab facility.  We will hold his chemotherapy. -I talked to him about starting him on pembrolizumab as second line therapy once his physical condition improves.  I reviewed his labs.  AST improved to 42.  Albumin is 2.8.  Encouraged high-protein diet.  We also talked about side effects of immunotherapy in detail.  [name] does not have any contraindications to immunotherapy at this time. -I will see him back in 2 weeks for follow-up.  2.  Bilateral leg swelling: -[name] is taking Lasix 20 mg daily and spironolactone. -As his potassium is 5.3, I will  discontinue spironolactone.  I will increase Lasix to 40 mg daily.  3. Bone metastasis: -We will consider adding denosumab if his general condition improves.  4. Hydronephrosis: -Bilateral percutaneous nephrostomy tubes on 02/22/2020. -Creatinine today is 1.01.  5. Microcytic anemia: -[name] received Feraheme previously.  Hemoglobin today improved to 10.6.  6.  Hypomagnesemia: -Magnesium is 2.0.  Continue oral magnesium.  7.  Panic attacks: -Continue Klonopin for panic attacks.   Orders placed this encounter:  No orders of the defined types were placed in this encounter.    Derek Jack, MD [place] [phone]   I, Milinda Antis, am acting as a scribe for Dr. Sanda Linger.  I, Derek Jack MD, have reviewed the above documentation for accuracy and completeness, and I agree with the above.

## 2020-05-09 NOTE — Progress Notes (Signed)
DISCONTINUE ON PATHWAY REGIMEN - Bladder     A cycle is every 21 days:     Carboplatin      Gemcitabine   **Always confirm dose/schedule in your pharmacy ordering system**  REASON: Toxicities / Adverse Event PRIOR TREATMENT: [id number]: Carboplatin AUC=5 D1 + Gemcitabine 1,000 mg/m2 D1, 8 q21 Days for a Maximum of 6 Cycles TREATMENT RESPONSE: Partial Response (PR)  START ON PATHWAY REGIMEN - Bladder     A cycle is every 21 days:     Pembrolizumab   **Always confirm dose/schedule in your pharmacy ordering system**  Patient Characteristics: Advanced/Metastatic Disease, Second Line, FGFR2/FGFR3 Mutation Negative or Unknown, Prior Platinum-Based Therapy and No Prior PD-1/PD-L1 Inhibitor Therapeutic Status: Advanced/Metastatic Disease Line of Therapy: Second Line FGFR2/FGFR3 Mutation Status: Negative Intent of Therapy: Non-Curative / Palliative Intent, Discussed with Patient

## 2020-05-09 NOTE — Patient Instructions (Addendum)
[facility] at [facility] Discharge Instructions  You were seen today by Dr. Delton Coombes. He went over your recent results and scans. You did not receive your treatment today, and instead of chemo you will start immunotherapy for your cancer. Take prochlorperazine at the onset of your nausea. Stop taking spironolactone and increase your Lasix to 40 mg daily. Increase your protein intake daily to improve your blood protein levels. Dr. Delton Coombes will see you back in 2 weeks for labs and follow up.   Thank you for choosing [facility] at [facility] to provide your oncology and hematology care.  To afford each patient quality time with our provider, please arrive at least 15 minutes before your scheduled appointment time.   If you have a lab appointment with the [facility] please come in thru the Main Entrance and check in at the main information desk  You need to re-schedule your appointment should you arrive 10 or more minutes late.  We strive to give you quality time with our providers, and arriving late affects you and other patients whose appointments are after yours.  Also, if you no show three or more times for appointments you may be dismissed from the clinic at the providers discretion.     Again, thank you for choosing [facility].  Our hope is that these requests will decrease the amount of time that you wait before being seen by our physicians.       _____________________________________________________________  Should you have questions after your visit to [facility], please contact our office at [phone] between the hours of 8:00 a.m. and 4:30 p.m.  Voicemails left after 4:00 p.m. will not be returned until the following business day.  For prescription refill requests, have your pharmacy contact our office and allow 72 hours.    Cancer Center Support Programs:   > Cancer Support Group  2nd Tuesday of the  month 1pm-2pm, Journey Room

## 2020-05-14 DIAGNOSIS — I2694 Multiple subsegmental pulmonary emboli without acute cor pulmonale: Secondary | ICD-10-CM | POA: Diagnosis not present

## 2020-05-14 DIAGNOSIS — Z436 Encounter for attention to other artificial openings of urinary tract: Secondary | ICD-10-CM | POA: Diagnosis not present

## 2020-05-14 DIAGNOSIS — Z7902 Long term (current) use of antithrombotics/antiplatelets: Secondary | ICD-10-CM | POA: Diagnosis not present

## 2020-05-14 DIAGNOSIS — H5462 Unqualified visual loss, left eye, normal vision right eye: Secondary | ICD-10-CM | POA: Diagnosis not present

## 2020-05-14 DIAGNOSIS — Z955 Presence of coronary angioplasty implant and graft: Secondary | ICD-10-CM | POA: Diagnosis not present

## 2020-05-14 DIAGNOSIS — I739 Peripheral vascular disease, unspecified: Secondary | ICD-10-CM | POA: Diagnosis not present

## 2020-05-14 DIAGNOSIS — I50812 Chronic right heart failure: Secondary | ICD-10-CM | POA: Diagnosis not present

## 2020-05-14 DIAGNOSIS — Z87440 Personal history of urinary (tract) infections: Secondary | ICD-10-CM | POA: Diagnosis not present

## 2020-05-14 DIAGNOSIS — I110 Hypertensive heart disease with heart failure: Secondary | ICD-10-CM | POA: Diagnosis not present

## 2020-05-14 DIAGNOSIS — M1990 Unspecified osteoarthritis, unspecified site: Secondary | ICD-10-CM | POA: Diagnosis not present

## 2020-05-14 DIAGNOSIS — Z86718 Personal history of other venous thrombosis and embolism: Secondary | ICD-10-CM | POA: Diagnosis not present

## 2020-05-14 DIAGNOSIS — C7951 Secondary malignant neoplasm of bone: Secondary | ICD-10-CM | POA: Diagnosis not present

## 2020-05-14 DIAGNOSIS — Z960 Presence of urogenital implants: Secondary | ICD-10-CM | POA: Diagnosis not present

## 2020-05-14 DIAGNOSIS — I5032 Chronic diastolic (congestive) heart failure: Secondary | ICD-10-CM | POA: Diagnosis not present

## 2020-05-14 DIAGNOSIS — I351 Nonrheumatic aortic (valve) insufficiency: Secondary | ICD-10-CM | POA: Diagnosis not present

## 2020-05-14 DIAGNOSIS — D649 Anemia, unspecified: Secondary | ICD-10-CM | POA: Diagnosis not present

## 2020-05-14 DIAGNOSIS — Z952 Presence of prosthetic heart valve: Secondary | ICD-10-CM | POA: Diagnosis not present

## 2020-05-14 DIAGNOSIS — E559 Vitamin D deficiency, unspecified: Secondary | ICD-10-CM | POA: Diagnosis not present

## 2020-05-14 DIAGNOSIS — I2510 Atherosclerotic heart disease of native coronary artery without angina pectoris: Secondary | ICD-10-CM | POA: Diagnosis not present

## 2020-05-14 DIAGNOSIS — K219 Gastro-esophageal reflux disease without esophagitis: Secondary | ICD-10-CM | POA: Diagnosis not present

## 2020-05-14 DIAGNOSIS — C7919 Secondary malignant neoplasm of other urinary organs: Secondary | ICD-10-CM | POA: Diagnosis not present

## 2020-05-14 DIAGNOSIS — E785 Hyperlipidemia, unspecified: Secondary | ICD-10-CM | POA: Diagnosis not present

## 2020-05-16 ENCOUNTER — Emergency Department (HOSPITAL_COMMUNITY): Payer: Medicare Other

## 2020-05-16 ENCOUNTER — Inpatient Hospital Stay (HOSPITAL_COMMUNITY)
Admission: EM | Admit: 2020-05-16 | Discharge: 2020-05-30 | DRG: 292 | Disposition: A | Discharge status: Home Health | Payer: Medicare Other | Attending: Internal Medicine | Admitting: Internal Medicine

## 2020-05-16 ENCOUNTER — Other Ambulatory Visit: Payer: Self-pay

## 2020-05-16 ENCOUNTER — Encounter (HOSPITAL_COMMUNITY): Payer: Self-pay | Admitting: *Deleted

## 2020-05-16 ENCOUNTER — Ambulatory Visit (INDEPENDENT_AMBULATORY_CARE_PROVIDER_SITE_OTHER): Payer: Medicare Other

## 2020-05-16 ENCOUNTER — Ambulatory Visit: Payer: Medicare Other | Admitting: Family Medicine

## 2020-05-16 VITALS — BP 127/71 | Ht 64.0 in | Wt 145.0 lb

## 2020-05-16 DIAGNOSIS — Z87440 Personal history of urinary (tract) infections: Secondary | ICD-10-CM

## 2020-05-16 DIAGNOSIS — Z0000 Encounter for general adult medical examination without abnormal findings: Secondary | ICD-10-CM

## 2020-05-16 DIAGNOSIS — I739 Peripheral vascular disease, unspecified: Secondary | ICD-10-CM | POA: Diagnosis present

## 2020-05-16 DIAGNOSIS — I110 Hypertensive heart disease with heart failure: Principal | ICD-10-CM | POA: Diagnosis present

## 2020-05-16 DIAGNOSIS — Z6820 Body mass index (BMI) 20.0-20.9, adult: Secondary | ICD-10-CM | POA: Diagnosis not present

## 2020-05-16 DIAGNOSIS — I10 Essential (primary) hypertension: Secondary | ICD-10-CM | POA: Diagnosis not present

## 2020-05-16 DIAGNOSIS — I2510 Atherosclerotic heart disease of native coronary artery without angina pectoris: Secondary | ICD-10-CM | POA: Diagnosis present

## 2020-05-16 DIAGNOSIS — Z86711 Personal history of pulmonary embolism: Secondary | ICD-10-CM

## 2020-05-16 DIAGNOSIS — I5043 Acute on chronic combined systolic (congestive) and diastolic (congestive) heart failure: Secondary | ICD-10-CM | POA: Diagnosis present

## 2020-05-16 DIAGNOSIS — G8929 Other chronic pain: Secondary | ICD-10-CM | POA: Diagnosis not present

## 2020-05-16 DIAGNOSIS — M1990 Unspecified osteoarthritis, unspecified site: Secondary | ICD-10-CM | POA: Diagnosis not present

## 2020-05-16 DIAGNOSIS — C678 Malignant neoplasm of overlapping sites of bladder: Secondary | ICD-10-CM | POA: Diagnosis present

## 2020-05-16 DIAGNOSIS — I509 Heart failure, unspecified: Secondary | ICD-10-CM | POA: Diagnosis not present

## 2020-05-16 DIAGNOSIS — J90 Pleural effusion, not elsewhere classified: Secondary | ICD-10-CM | POA: Diagnosis not present

## 2020-05-16 DIAGNOSIS — F320 Major depressive disorder, single episode, mild: Secondary | ICD-10-CM | POA: Diagnosis present

## 2020-05-16 DIAGNOSIS — I2609 Other pulmonary embolism with acute cor pulmonale: Secondary | ICD-10-CM | POA: Diagnosis not present

## 2020-05-16 DIAGNOSIS — Z20822 Contact with and (suspected) exposure to covid-19: Secondary | ICD-10-CM | POA: Diagnosis present

## 2020-05-16 DIAGNOSIS — Z23 Encounter for immunization: Secondary | ICD-10-CM | POA: Diagnosis not present

## 2020-05-16 DIAGNOSIS — R0602 Shortness of breath: Secondary | ICD-10-CM | POA: Diagnosis not present

## 2020-05-16 DIAGNOSIS — I080 Rheumatic disorders of both mitral and aortic valves: Secondary | ICD-10-CM | POA: Diagnosis present

## 2020-05-16 DIAGNOSIS — C689 Malignant neoplasm of urinary organ, unspecified: Secondary | ICD-10-CM | POA: Diagnosis not present

## 2020-05-16 DIAGNOSIS — I517 Cardiomegaly: Secondary | ICD-10-CM | POA: Diagnosis not present

## 2020-05-16 DIAGNOSIS — H547 Unspecified visual loss: Secondary | ICD-10-CM | POA: Diagnosis not present

## 2020-05-16 DIAGNOSIS — Z66 Do not resuscitate: Secondary | ICD-10-CM | POA: Diagnosis present

## 2020-05-16 DIAGNOSIS — Z515 Encounter for palliative care: Secondary | ICD-10-CM | POA: Diagnosis not present

## 2020-05-16 DIAGNOSIS — Z86718 Personal history of other venous thrombosis and embolism: Secondary | ICD-10-CM

## 2020-05-16 DIAGNOSIS — C7951 Secondary malignant neoplasm of bone: Secondary | ICD-10-CM | POA: Diagnosis not present

## 2020-05-16 DIAGNOSIS — R64 Cachexia: Secondary | ICD-10-CM | POA: Diagnosis present

## 2020-05-16 DIAGNOSIS — F1010 Alcohol abuse, uncomplicated: Secondary | ICD-10-CM | POA: Diagnosis present

## 2020-05-16 DIAGNOSIS — Z743 Need for continuous supervision: Secondary | ICD-10-CM | POA: Diagnosis not present

## 2020-05-16 DIAGNOSIS — Z7982 Long term (current) use of aspirin: Secondary | ICD-10-CM

## 2020-05-16 DIAGNOSIS — I5023 Acute on chronic systolic (congestive) heart failure: Secondary | ICD-10-CM | POA: Diagnosis not present

## 2020-05-16 DIAGNOSIS — E871 Hypo-osmolality and hyponatremia: Secondary | ICD-10-CM | POA: Diagnosis present

## 2020-05-16 DIAGNOSIS — Z7189 Other specified counseling: Secondary | ICD-10-CM

## 2020-05-16 DIAGNOSIS — Z7901 Long term (current) use of anticoagulants: Secondary | ICD-10-CM | POA: Diagnosis not present

## 2020-05-16 DIAGNOSIS — D638 Anemia in other chronic diseases classified elsewhere: Secondary | ICD-10-CM | POA: Diagnosis not present

## 2020-05-16 DIAGNOSIS — Z952 Presence of prosthetic heart valve: Secondary | ICD-10-CM | POA: Diagnosis not present

## 2020-05-16 DIAGNOSIS — R54 Age-related physical debility: Secondary | ICD-10-CM | POA: Diagnosis not present

## 2020-05-16 DIAGNOSIS — E785 Hyperlipidemia, unspecified: Secondary | ICD-10-CM | POA: Diagnosis present

## 2020-05-16 DIAGNOSIS — C799 Secondary malignant neoplasm of unspecified site: Secondary | ICD-10-CM

## 2020-05-16 DIAGNOSIS — Z79899 Other long term (current) drug therapy: Secondary | ICD-10-CM

## 2020-05-16 DIAGNOSIS — I2782 Chronic pulmonary embolism: Secondary | ICD-10-CM | POA: Diagnosis not present

## 2020-05-16 DIAGNOSIS — R0902 Hypoxemia: Secondary | ICD-10-CM | POA: Diagnosis not present

## 2020-05-16 DIAGNOSIS — I2699 Other pulmonary embolism without acute cor pulmonale: Secondary | ICD-10-CM | POA: Diagnosis not present

## 2020-05-16 DIAGNOSIS — N1330 Unspecified hydronephrosis: Secondary | ICD-10-CM | POA: Diagnosis not present

## 2020-05-16 DIAGNOSIS — I351 Nonrheumatic aortic (valve) insufficiency: Secondary | ICD-10-CM | POA: Diagnosis present

## 2020-05-16 DIAGNOSIS — Z953 Presence of xenogenic heart valve: Secondary | ICD-10-CM

## 2020-05-16 DIAGNOSIS — R069 Unspecified abnormalities of breathing: Secondary | ICD-10-CM | POA: Diagnosis not present

## 2020-05-16 DIAGNOSIS — R079 Chest pain, unspecified: Secondary | ICD-10-CM

## 2020-05-16 DIAGNOSIS — Z95828 Presence of other vascular implants and grafts: Secondary | ICD-10-CM

## 2020-05-16 DIAGNOSIS — Z955 Presence of coronary angioplasty implant and graft: Secondary | ICD-10-CM

## 2020-05-16 DIAGNOSIS — R7401 Elevation of levels of liver transaminase levels: Secondary | ICD-10-CM | POA: Diagnosis not present

## 2020-05-16 LAB — CBC WITH DIFFERENTIAL/PLATELET
Abs Immature Granulocytes: 0.14 10*3/uL — ABNORMAL HIGH (ref 0.00–0.07)
Basophils Absolute: 0.1 10*3/uL (ref 0.0–0.1)
Basophils Relative: 1 %
Eosinophils Absolute: 0.1 10*3/uL (ref 0.0–0.5)
Eosinophils Relative: 1 %
HCT: 37.3 % — ABNORMAL LOW (ref 39.0–52.0)
Hemoglobin: 11.5 g/dL — ABNORMAL LOW (ref 13.0–17.0)
Immature Granulocytes: 1 %
Lymphocytes Relative: 13 %
Lymphs Abs: 1.3 10*3/uL (ref 0.7–4.0)
MCH: 26.6 pg (ref 26.0–34.0)
MCHC: 30.8 g/dL (ref 30.0–36.0)
MCV: 86.1 fL (ref 80.0–100.0)
Monocytes Absolute: 1.2 10*3/uL — ABNORMAL HIGH (ref 0.1–1.0)
Monocytes Relative: 12 %
Neutro Abs: 7.6 10*3/uL (ref 1.7–7.7)
Neutrophils Relative %: 72 %
Platelets: 192 10*3/uL (ref 150–400)
RBC: 4.33 MIL/uL (ref 4.22–5.81)
RDW: 25.3 % — ABNORMAL HIGH (ref 11.5–15.5)
WBC: 10.4 10*3/uL (ref 4.0–10.5)
nRBC: 0 % (ref 0.0–0.2)

## 2020-05-16 LAB — HEPATIC FUNCTION PANEL
ALT: 27 U/L (ref 0–44)
AST: 45 U/L — ABNORMAL HIGH (ref 15–41)
Albumin: 3.2 g/dL — ABNORMAL LOW (ref 3.5–5.0)
Alkaline Phosphatase: 704 U/L — ABNORMAL HIGH (ref 38–126)
Bilirubin, Direct: 0.4 mg/dL — ABNORMAL HIGH (ref 0.0–0.2)
Indirect Bilirubin: 1.1 mg/dL — ABNORMAL HIGH (ref 0.3–0.9)
Total Bilirubin: 1.5 mg/dL — ABNORMAL HIGH (ref 0.3–1.2)
Total Protein: 6.9 g/dL (ref 6.5–8.1)

## 2020-05-16 LAB — BRAIN NATRIURETIC PEPTIDE: B Natriuretic Peptide: >4500 pg/mL — ABNORMAL HIGH (ref 0.0–100.0)

## 2020-05-16 LAB — BASIC METABOLIC PANEL
Anion gap: 12 (ref 5–15)
BUN: 29 mg/dL — ABNORMAL HIGH (ref 8–23)
CO2: 24 mmol/L (ref 22–32)
Calcium: 8.4 mg/dL — ABNORMAL LOW (ref 8.9–10.3)
Chloride: 90 mmol/L — ABNORMAL LOW (ref 98–111)
Creatinine, Ser: 0.82 mg/dL (ref 0.61–1.24)
GFR calc Af Amer: >60 mL/min (ref >60)
GFR calc non Af Amer: >60 mL/min (ref >60)
Glucose, Bld: 105 mg/dL — ABNORMAL HIGH (ref 70–99)
Potassium: 4.7 mmol/L (ref 3.5–5.1)
Sodium: 126 mmol/L — ABNORMAL LOW (ref 135–145)

## 2020-05-16 LAB — SARS CORONAVIRUS 2 BY RT PCR (HOSPITAL ORDER, PERFORMED IN ~~LOC~~ HOSPITAL LAB): SARS Coronavirus 2: NEGATIVE

## 2020-05-16 LAB — TROPONIN I (HIGH SENSITIVITY): Troponin I (High Sensitivity): 34 ng/L — ABNORMAL HIGH (ref <18)

## 2020-05-16 MED ORDER — FUROSEMIDE 10 MG/ML IJ SOLN
40.0000 mg | INTRAMUSCULAR | Status: AC
  Administered 2020-05-16: 40 mg via INTRAVENOUS
  Filled 2020-05-16: qty 4

## 2020-05-16 MED ORDER — ACETAMINOPHEN 325 MG PO TABS
650.0000 mg | ORAL_TABLET | ORAL | Status: DC | PRN
  Administered 2020-05-27: 650 mg via ORAL
  Filled 2020-05-16 (×2): qty 2

## 2020-05-16 MED ORDER — LISINOPRIL 5 MG PO TABS
5.0000 mg | ORAL_TABLET | Freq: Every day | ORAL | Status: DC
  Administered 2020-05-17 – 2020-05-20 (×4): 5 mg via ORAL
  Filled 2020-05-16 (×5): qty 1

## 2020-05-16 MED ORDER — SODIUM CHLORIDE 0.9% FLUSH: 3.0000 mL | INTRAVENOUS | Status: DC | PRN

## 2020-05-16 MED ORDER — FUROSEMIDE 10 MG/ML IJ SOLN
40.0000 mg | Freq: Two times a day (BID) | INTRAMUSCULAR | Status: DC
  Administered 2020-05-17: 40 mg via INTRAVENOUS
  Filled 2020-05-16: qty 4

## 2020-05-16 MED ORDER — ONDANSETRON HCL 4 MG/2ML IJ SOLN
4.0000 mg | Freq: Once | INTRAMUSCULAR | Status: AC
  Administered 2020-05-16: 4 mg via INTRAVENOUS
  Filled 2020-05-16: qty 2

## 2020-05-16 MED ORDER — ONDANSETRON HCL 4 MG/2ML IJ SOLN
4.0000 mg | Freq: Four times a day (QID) | INTRAMUSCULAR | Status: DC | PRN
  Administered 2020-05-17 – 2020-05-27 (×2): 4 mg via INTRAVENOUS
  Filled 2020-05-16 (×2): qty 2

## 2020-05-16 MED ORDER — SPIRONOLACTONE 12.5 MG HALF TABLET
12.5000 mg | ORAL_TABLET | Freq: Every day | ORAL | Status: DC
  Filled 2020-05-16 (×4): qty 1

## 2020-05-16 MED ORDER — METOPROLOL SUCCINATE ER 50 MG PO TB24
50.0000 mg | ORAL_TABLET | Freq: Every day | ORAL | Status: DC
  Administered 2020-05-17 – 2020-05-30 (×14): 50 mg via ORAL
  Filled 2020-05-16 (×14): qty 1

## 2020-05-16 MED ORDER — APIXABAN 5 MG PO TABS
5.0000 mg | ORAL_TABLET | Freq: Two times a day (BID) | ORAL | Status: DC
  Administered 2020-05-16 – 2020-05-30 (×28): 5 mg via ORAL
  Filled 2020-05-16 (×28): qty 1

## 2020-05-16 MED ORDER — ADJUSTABLE COMMODE 3-IN-1 MISC: 0 refills | Status: DC

## 2020-05-16 NOTE — Patient Instructions (Addendum)
[name] , Thank you for taking time to come for your Medicare Wellness Visit. I appreciate your ongoing commitment to your health goals. Please review the following plan we discussed and let me know if I can assist you in the future.   Screening recommendations/referrals: Colonoscopy: up to date Recommended yearly ophthalmology/optometry visit for glaucoma screening and checkup Recommended yearly dental visit for hygiene and checkup  Vaccinations: Influenza vaccine: given today Pneumococcal vaccine: up to date Tdap vaccine: up to date Shingles vaccine: can get at pharmacy    Advanced directives: forms on file    Next appointment: wellness in 1 year  Preventive Care 68 Years and Older, Male Preventive care refers to lifestyle choices and visits with your health care provider that can promote health and wellness. What does preventive care include?  A yearly physical exam. This is also called an annual well check.  Dental exams once or twice a year.  Routine eye exams. Ask your health care provider how often you should have your eyes checked.  Personal lifestyle choices, including:  Daily care of your teeth and gums.  Regular physical activity.  Eating a healthy diet.  Avoiding tobacco and drug use.  Limiting alcohol use.  Practicing safe sex.  Taking low doses of aspirin every day.  Taking vitamin and mineral supplements as recommended by your health care provider. What happens during an annual well check? The services and screenings done by your health care provider during your annual well check will depend on your age, overall health, lifestyle risk factors, and family history of disease. Counseling  Your health care provider may ask you questions about your:  Alcohol use.  Tobacco use.  Drug use.  Emotional well-being.  Home and relationship well-being.  Sexual activity.  Eating habits.  History of falls.  Memory and ability to understand  (cognition).  Work and work Statistician. Screening  You may have the following tests or measurements:  Height, weight, and BMI.  Blood pressure.  Lipid and cholesterol levels. These may be checked every 5 years, or more frequently if you are over 18 years old.  Skin check.  Lung cancer screening. You may have this screening every year starting at age 35 if you have a 30-pack-year history of smoking and currently smoke or have quit within the past 15 years.  Fecal occult blood test (FOBT) of the stool. You may have this test every year starting at age 55.  Flexible sigmoidoscopy or colonoscopy. You may have a sigmoidoscopy every 5 years or a colonoscopy every 10 years starting at age 68.  Prostate cancer screening. Recommendations will vary depending on your family history and other risks.  Hepatitis C blood test.  Hepatitis B blood test.  Sexually transmitted disease (STD) testing.  Diabetes screening. This is done by checking your blood sugar (glucose) after you have not eaten for a while (fasting). You may have this done every 1-3 years.  Abdominal aortic aneurysm (AAA) screening. You may need this if you are a current or former smoker.  Osteoporosis. You may be screened starting at age 60 if you are at high risk. Talk with your health care provider about your test results, treatment options, and if necessary, the need for more tests. Vaccines  Your health care provider may recommend certain vaccines, such as:  Influenza vaccine. This is recommended every year.  Tetanus, diphtheria, and acellular pertussis (Tdap, Td) vaccine. You may need a Td booster every 10 years.  Zoster vaccine. You may  need this after age 38.  Pneumococcal 13-valent conjugate (PCV13) vaccine. One dose is recommended after age 31.  Pneumococcal polysaccharide (PPSV23) vaccine. One dose is recommended after age 12. Talk to your health care provider about which screenings and vaccines you need and  how often you need them. This information is not intended to replace advice given to you by your health care provider. Make sure you discuss any questions you have with your health care provider. Document Released: 10/04/2015 Document Revised: 05/27/2016 Document Reviewed: 07/09/2015 Elsevier Interactive Patient Education  2017 [employer] Prevention in the Home Falls can cause injuries. They can happen to people of all ages. There are many things you can do to make your home safe and to help prevent falls. What can I do on the outside of my home?  Regularly fix the edges of walkways and driveways and fix any cracks.  Remove anything that might make you trip as you walk through a door, such as a raised step or threshold.  Trim any bushes or trees on the path to your home.  Use bright outdoor lighting.  Clear any walking paths of anything that might make someone trip, such as rocks or tools.  Regularly check to see if handrails are loose or broken. Make sure that both sides of any steps have handrails.  Any raised decks and porches should have guardrails on the edges.  Have any leaves, snow, or ice cleared regularly.  Use sand or salt on walking paths during winter.  Clean up any spills in your garage right away. This includes oil or grease spills. What can I do in the bathroom?  Use night lights.  Install grab bars by the toilet and in the tub and shower. Do not use towel bars as grab bars.  Use non-skid mats or decals in the tub or shower.  If you need to sit down in the shower, use a plastic, non-slip stool.  Keep the floor dry. Clean up any water that spills on the floor as soon as it happens.  Remove soap buildup in the tub or shower regularly.  Attach bath mats securely with double-sided non-slip rug tape.  Do not have throw rugs and other things on the floor that can make you trip. What can I do in the bedroom?  Use night lights.  Make sure that you  have a light by your bed that is easy to reach.  Do not use any sheets or blankets that are too big for your bed. They should not hang down onto the floor.  Have a firm chair that has side arms. You can use this for support while you get dressed.  Do not have throw rugs and other things on the floor that can make you trip. What can I do in the kitchen?  Clean up any spills right away.  Avoid walking on wet floors.  Keep items that you use a lot in easy-to-reach places.  If you need to reach something above you, use a strong step stool that has a grab bar.  Keep electrical cords out of the way.  Do not use floor polish or wax that makes floors slippery. If you must use wax, use non-skid floor wax.  Do not have throw rugs and other things on the floor that can make you trip. What can I do with my stairs?  Do not leave any items on the stairs.  Make sure that there are handrails on both sides  of the stairs and use them. Fix handrails that are broken or loose. Make sure that handrails are as long as the stairways.  Check any carpeting to make sure that it is firmly attached to the stairs. Fix any carpet that is loose or worn.  Avoid having throw rugs at the top or bottom of the stairs. If you do have throw rugs, attach them to the floor with carpet tape.  Make sure that you have a light switch at the top of the stairs and the bottom of the stairs. If you do not have them, ask someone to add them for you. What else can I do to help prevent falls?  Wear shoes that:  Do not have high heels.  Have rubber bottoms.  Are comfortable and fit you well.  Are closed at the toe. Do not wear sandals.  If you use a stepladder:  Make sure that it is fully opened. Do not climb a closed stepladder.  Make sure that both sides of the stepladder are locked into place.  Ask someone to hold it for you, if possible.  Clearly mark and make sure that you can see:  Any grab bars or  handrails.  First and last steps.  Where the edge of each step is.  Use tools that help you move around (mobility aids) if they are needed. These include:  Canes.  Walkers.  Scooters.  Crutches.  Turn on the lights when you go into a dark area. Replace any light bulbs as soon as they burn out.  Set up your furniture so you have a clear path. Avoid moving your furniture around.  If any of your floors are uneven, fix them.  If there are any pets around you, be aware of where they are.  Review your medicines with your doctor. Some medicines can make you feel dizzy. This can increase your chance of falling. Ask your doctor what other things that you can do to help prevent falls. This information is not intended to replace advice given to you by your health care provider. Make sure you discuss any questions you have with your health care provider. Document Released: 07/04/2009 Document Revised: 02/13/2016 Document Reviewed: 10/12/2014 Elsevier Interactive Patient Education  2017 [employer]. .kh

## 2020-05-16 NOTE — ED Notes (Signed)
CRITICAL VALUE ALERT  Critical Value:  BNP >4,500  Date & Time Notied:  9359 05/16/20  Provider Notified: [email or web address]  Orders Received/Actions taken: md notified

## 2020-05-16 NOTE — H&P (Signed)
History and Physical    [name] IDP:[id number] DOB: 15-Sep-1951 DOA: 05/16/2020  PCP: [name], MD   Patient coming from: Home  I have personally briefly reviewed patient's old medical records in [place]  Chief Complaint: Difficulty breathing leg swelling  HPI: [name] is a 69 y.o. male with medical history significant for systolic and diastolic CHF, metastatic urothelial cancer, aortic valve replacement, hypertension, peripheral artery disease, pulmonary embolism.  Patient presented to the ED with complaints of difficulty breathing, intermittent over the past 8 months, also bilateral lower extremity swelling of the same duration.  He also reports left-sided/central chest pain nonradiating present with exertion and improves with rest.  Reports compliance with his Lasix 40 mg twice a day, and Eliquis.  One episode of vomiting in the ED.  5 hospitalizations since May-for acute on chronic anemia, decompensated CHF, PE, hyponatremia, UTI in the setting of urothelial cancer with bilateral nephrostomy tubes.  Recent hospitalization  8/1 - 8/5-bilateral pulmonary embolism in the setting of underlying malignancy and decompensated CHF.  Was discharged on Eliquis, he tells me he has been taking his medications.  Was treated for urinary tract infection with urine cultures growing Acinetobacter and Enterococcus.  ED Course: Temperature 97.6, heart rate 80s, respiratory less than 20, blood pressure systolic 443X, O2 sats 92 to 97% on room air.  BNP markedly elevated at greater than 4500, same as recent hospitalization.  Portable chest x-ray shows mild right pleural effusion, right basilar atelectasis or infiltrate.  Sodium 126 (recent baseline low to mid 120s).  Lasix 40 mg given.  Hospitalist admit for further evaluation and management.  Review of Systems: As per HPI all other systems reviewed and negative.  Past Medical History:  Diagnosis Date  . Arterial embolus  of lower extremity ([place]) 11/23/2013  . Arthritis   . CAD (coronary artery disease)    a. s/p PCI with 3.5 x 18 BMS to mid RCA, non obst LAD, Lcx patent 08/10/13  . Cancer ([place]) 2021   bladder  . Depression, major, single episode, mild ([place]) 12/20/2019  . ED (erectile dysfunction) 03/27/2015  . Embolus of femoral artery [facility]) 2012   Surgical repair, previously on Coumadin  . Essential hypertension   . ETOH abuse   . Hematuria 10/17/2019  . Hernia of abdominal cavity 02/27/2019  . History of DVT (deep vein thrombosis) 2011  . History of GI bleed    February 2012   . Ingrown left greater toenail 09/04/2018  . 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 Low oxygen saturation 01/31/2020  . 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 Pain with urination 10/17/2019  . Port-A-Cath in place 02/23/2020   right  . Prosthetic valve endocarditis ([place])   . PVD (peripheral vascular disease) ([place])   . S/P aortic valve replacement    Details not clear -  reportedly 1971 per patient    Past Surgical History:  Procedure Laterality Date  . ABDOMINAL AORTOGRAM W/LOWER EXTREMITY N/A 10/31/2018   Procedure: ABDOMINAL AORTOGRAM W/LOWER EXTREMITY;  Surgeon: Lorretta Harp, MD;  Location: [place] CV LAB;  Service: Cardiovascular;  Laterality: N/A;  . Aoric valve replacement  January 2005   63mm Medtronic Freestyle stentless porcine bioprosthesis [facility])  . CARDIAC CATHETERIZATION     30-40% mid LAD, 20-30% ostial D1, proximal 30-40% RCA, 95% mid RCA thrombus, 40% distal stenosis. EF 45%,  inferior HK.  [name] COLONOSCOPY N/A 04/20/2014   Procedure: COLONOSCOPY;  Surgeon: Rogene Houston, MD;  [facility];  Service: Endoscopy;  Laterality: N/A;  930  . COLONOSCOPY WITH PROPOFOL N/A 02/02/2020   Procedure: COLONOSCOPY WITH PROPOFOL;  Surgeon: Rogene Houston, MD;  [facility];  Service: Endoscopy;  Laterality: N/A;  .  CORONARY ANGIOPLASTY WITH STENT PLACEMENT  07/2013  . ESOPHAGOGASTRODUODENOSCOPY (EGD) WITH PROPOFOL N/A 02/02/2020   Procedure: ESOPHAGOGASTRODUODENOSCOPY (EGD) WITH PROPOFOL;  Surgeon: Rogene Houston, MD;  [facility];  Service: Endoscopy;  Laterality: N/A;  . INGUINAL HERNIA REPAIR Bilateral 04/17/2019   Procedure: ATTEMPTED LAPAROSCOPIC BILATERAL INGUINAL HERNIA REPAIR;  Surgeon: Coralie Keens, MD;  Location: [place];  Service: General;  Laterality: Bilateral;  . INGUINAL HERNIA REPAIR Bilateral 04/17/2019   Procedure: Hernia Repair Inguinal Adult;  Surgeon: Coralie Keens, MD;  Location: [place];  Service: General;  Laterality: Bilateral;  . IR IMAGING GUIDED PORT INSERTION  02/22/2020  . IR NEPHROSTOMY EXCHANGE LEFT  04/05/2020  . IR NEPHROSTOMY EXCHANGE RIGHT  04/05/2020  . IR NEPHROSTOMY PLACEMENT LEFT  02/22/2020  . IR NEPHROSTOMY PLACEMENT RIGHT  02/22/2020  . LEFT HEART CATHETERIZATION WITH CORONARY ANGIOGRAM N/A 08/10/2013   Procedure: LEFT HEART CATHETERIZATION WITH CORONARY ANGIOGRAM;  Surgeon: Blane Ohara, MD;  Location: [facility] CATH LAB;  Service: Cardiovascular;  Laterality: N/A;  . Left leg embolectomy  May 2012   Dr. Trula Slade - reason for lifelong coumadin  . LYMPH NODE BIOPSY Left 11/21/2019   Procedure: Left Neck LYMPH NODE BIOPSY;  Surgeon: Rozetta Nunnery, MD;  Location: [place];  Service: ENT;  Laterality: Left;  . PERIPHERAL VASCULAR ATHERECTOMY  10/31/2018   Procedure: PERIPHERAL VASCULAR ATHERECTOMY;  Surgeon: Lorretta Harp, MD;  Location: [place] CV LAB;  Service: Cardiovascular;;  left SFA  . POLYPECTOMY  02/02/2020   Procedure: POLYPECTOMY;  Surgeon: Rogene Houston, MD;  [facility];  Service: Endoscopy;;  splenic flexure     reports that he quit smoking about 50 years ago. His smoking use included cigarettes. He started smoking about 54 years ago. He has a 1.50 pack-year smoking history. He has never used smokeless tobacco. He reports  previous alcohol use. He reports current drug use. Drug: Marijuana.  No Known Allergies  Family History  Problem Relation Age of Onset  . Diabetes Mother   . Cirrhosis Father   . COPD Sister     Prior to Admission medications   Medication Sig Start Date End Date Taking? Authorizing Provider  acetaminophen (TYLENOL) 325 MG tablet Take 2 tablets (650 mg total) by mouth every 6 (six) hours as needed for mild pain or moderate pain. 04/25/20   Roxan Hockey, MD  Apixaban Starter Pack, 10mg  and 5mg , (ELIQUIS DVT/PE STARTER PACK) Take as directed on package: start with two-5mg  tablets twice daily for 7 days. On day 8, switch to one-5mg  tablet twice daily. 04/25/20   Roxan Hockey, MD  aspirin EC 81 MG tablet Take 1 tablet (81 mg total) by mouth daily. 11/01/17   [name], MD  atorvastatin (LIPITOR) 40 MG tablet Take 1 tablet (40 mg total) by mouth daily. 03/21/20   Perlie Mayo, NP  busPIRone (BUSPAR) 10 MG tablet Take 1 tablet (10 mg total) by mouth 2 (two) times daily. 12/20/19   Perlie Mayo, NP  cholecalciferol (VITAMIN D) 1000 units tablet Take 1 tablet (1,000 Units total) by mouth daily. 11/01/17  [name], MD  clonazePAM (KLONOPIN) 0.5 MG tablet Take 1 tablet (0.5 mg total) by mouth at bedtime. 04/25/20   Roxan Hockey, MD  escitalopram (LEXAPRO) 10 MG tablet Take 1 tablet (10 mg total) by mouth daily. 12/20/19   Perlie Mayo, NP  fluticasone (FLONASE) 50 MCG/ACT nasal spray Use 2 spray(s) in each nostril once daily Patient taking differently: Place 2 sprays into both nostrils daily.  03/22/20   Perlie Mayo, NP  furosemide (LASIX) 20 MG tablet Take 1 tablet (20 mg total) by mouth daily. 04/25/20   Roxan Hockey, MD  lisinopril (ZESTRIL) 5 MG tablet Take 1 tablet (5 mg total) by mouth daily. 04/25/20 06/24/20  Roxan Hockey, MD  magnesium oxide (MAG-OX) 400 (241.3 Mg) MG tablet Take 1 tablet (400 mg total) by mouth 2 (two) times daily. 04/04/20   Derek Jack, MD  metoprolol succinate (TOPROL-XL) 50 MG 24 hr tablet TAKE 1 TABLET BY MOUTH ONCE DAILY WITH&nbsp;&nbsp;OR&nbsp;&nbsp;IMMEDIATELY&nbsp;&nbsp;FOLLOWING&nbsp;&nbsp;A&nbsp;&nbsp;MEAL Patient taking differently: Take 50 mg by mouth daily. WITH  OR  IMMEDIATELY  FOLLOWING  A  MEAL 03/22/20   Perlie Mayo, NP  Misc. Devices (ADJUSTABLE COMMODE 3-IN-1) MISC Dx R26.81 05/16/20   [name], MD  montelukast (SINGULAIR) 10 MG tablet Take 1 tablet (10 mg total) by mouth at bedtime. 03/22/20   Perlie Mayo, NP  nitroGLYCERIN (NITROSTAT) 0.4 MG SL tablet Place 1 tablet (0.4 mg total) under the tongue every 5 (five) minutes as needed for chest pain. 12/20/19   Lorretta Harp, MD  oxyCODONE (OXY IR/ROXICODONE) 5 MG immediate release tablet Take 1 tablet (5 mg total) by mouth every 4 (four) hours as needed for moderate pain or severe pain. 04/25/20   Roxan Hockey, MD  pantoprazole (PROTONIX) 40 MG tablet Take 1 tablet (40 mg total) by mouth daily before breakfast. 02/03/20   Amin, Jeanella Flattery, MD  senna-docusate (SENOKOT-S) 8.6-50 MG tablet Take 2 tablets by mouth at bedtime. 04/25/20 04/25/21  Roxan Hockey, MD  spironolactone (ALDACTONE) 25 MG tablet Take 0.5 tablets (12.5 mg total) by mouth daily. 04/26/20   Roxan Hockey, MD  trolamine salicylate (ASPERCREME) 10 % cream Apply 1 application topically daily as needed for muscle pain (for pain in feet). For pain in legs (swelling)    [provider]  prochlorperazine (COMPAZINE) 10 MG tablet Take 1 tablet (10 mg total) by mouth every 6 (six) hours as needed (Nausea or vomiting). 03/28/20 05/09/20  Derek Jack, MD    Physical Exam: Vitals:   05/16/20 1212 05/16/20 1213 05/16/20 1508 05/16/20 1636  BP:  136/72 138/79 139/82  Pulse:  83 81 84  Resp:  20 20 18   Temp:  97.6 F (36.4 C)    TempSrc:  Temporal    SpO2:  95% 92% 97%  Weight: 68 kg     Height: 5\' 4"  (1.626 m)       Constitutional: Chronically ill-appearing  , calm, comfortable Vitals:   05/16/20 1212 05/16/20 1213 05/16/20 1508 05/16/20 1636  BP:  136/72 138/79 139/82  Pulse:  83 81 84  Resp:  20 20 18   Temp:  97.6 F (36.4 C)    TempSrc:  Temporal    SpO2:  95% 92% 97%  Weight: 68 kg     Height: 5\' 4"  (1.626 m)      Eyes: PERRL, lids and conjunctivae normal ENMT: Mucous membranes are moist.   Neck: Distended neck veins.  Normal, supple, no masses, no thyromegaly Respiratory:  clear to auscultation bilaterally, no wheezing, no crackles. Normal respiratory effort. No accessory muscle use at rest.  Port-A-Cath right upper chest.  No surrounding erythema redness or tenderness. Cardiovascular: Regular rate and rhythm, no murmurs / rubs / gallops. > 3 + pitting extremity edema past knees up to abdomen.  . 2+ pedal pulses.  Also sacral edema.  Abdomen: no tenderness, no masses palpated. No hepatosplenomegaly. Bowel sounds positive.  Bilateral nephrostomy tube draining clear urine Musculoskeletal: no clubbing / cyanosis. No joint deformity upper and lower extremities. Good ROM, no contractures. Normal muscle tone.  Skin: no rashes, lesions, ulcers. No induration Neurologic: No apparent cranial nerve abnormality, moving extremities spontaneously. Psychiatric: Normal judgment and insight. Alert and oriented x 3. Normal mood.   Labs on Admission: I have personally reviewed following labs and imaging studies  CBC: Recent Labs  Lab 05/16/20 1324  WBC 10.4  NEUTROABS 7.6  HGB 11.5*  HCT 37.3*  MCV 86.1  PLT 220   Basic Metabolic Panel: Recent Labs  Lab 05/16/20 1324  NA 126*  K 4.7  CL 90*  CO2 24  GLUCOSE 105*  BUN 29*  CREATININE 0.82  CALCIUM 8.4*     Radiological Exams on Admission: DG Chest Port 1 View  Result Date: 05/16/2020 CLINICAL DATA:  Shortness of breath. EXAM: PORTABLE CHEST 1 VIEW COMPARISON:  April 21, 2020. FINDINGS: Stable cardiomegaly. No pneumothorax is noted. Status post aortic valve repair. Right internal  jugular Port-A-Cath is unchanged in position. Left lung is clear. Mild right pleural effusion is noted with associated right basilar atelectasis or infiltrate. Bony thorax is unremarkable. IMPRESSION: Mild right pleural effusion is noted with associated right basilar atelectasis or infiltrate. Electronically Signed   By: Marijo Conception M.D.   On: 05/16/2020 13:22    EKG: Independently reviewed.  Sinus rhythm with first-degree AV block PR interval of 262-this is not new.  No significant ST or T wave changes.  QTc 476.  Assessment/Plan Principal Problem:   Acute on chronic combined systolic and diastolic CHF (congestive heart failure) (HCC) Active Problems:   Essential hypertension, benign   Severe aortic insufficiency   Depression, major, single episode, mild (HCC)   Port-A-Cath in place   Acute on chronic combined systolic and diastolic CHF-markedly volume overloaded with 3+ extremity edema to abdomen, with sacral edema, per charts weights 150 today, on recent discharge weight was 143.  Not hypoxic.  EF 04/24/2019 125 to 30%.  Portable chest x-ray shows mild right pleural effusion.  BNP markedly elevated at > 4500.  Reports compliance with Lasix 40 twice daily.  -Strict input output, daily weights -Fluid restriction -IV Lasix 40 every 12 hourly -Patient's long-term prognosis is poor, with 5 hospitalizations since May. -Cardiology consult -Check Albumin  Chest pain-typical, EKG unremarkable.  Reports compliance with Eliquis. -Troponin x2 -Continue aspirin, statins  Hyponatremia-sodium 126, recent baseline low to mid 120s.  Likely due to apparent and marked volume overload. -IV Lasix  Metatstatic Urothelial Carcinoma -  with hydronephrosis 2/2 distal obstructioncurrently with bilateral nephrostomy tubes.  Follows with Dr. Delton Coombes.  Last chemotherapy 05/09/2020.    Pulmonary embolism-diagnosed on recent hospitalization this month. -Resume Eliquis.  S/p TAVR---aortic valve has been  repaired/replaced. Aortic valve  regurgitation is trivial. There is a 26 mm Edwards Sapien prosthetic  (TAVR) valve present in the aortic position (patient has valve in valve  TAVR with previous bioprosthetic AVR). Aortic valve mean gradient measures 8.0 mmHg. There is a moderate paravalvular leak at 9  o'clock position.  -Outpatient follow-up with cardiology was advised  Hypertension-stable. -Continue lisinopril, metoprolol, spironolactone  DVT prophylaxis: Eliquis Code Status: DNR, reconfirmed with patient at bedside, consistent with prior documentation. Family Communication: None at bedside Disposition Plan: Greater than 2 days, pending aggressiveness and duration required for diuresis. Consults called: Cardiology Admission status: Inpatient, telemetry I certify that at the point of admission it is my clinical judgment that the patient will require inpatient hospital care spanning beyond 2 midnights from the point of admission due to high intensity of service, high risk for further deterioration and high frequency of surveillance required.    Bethena Roys MD [facility]  05/16/2020, 10:25 PM

## 2020-05-16 NOTE — Progress Notes (Signed)
Subjective:   [name] is a 69 y.o. male who presents for Medicare Annual/Subsequent preventive examination.  Review of Systems     Cardiac Risk Factors include: advanced age (>10men, >82 women);dyslipidemia;male gender;sedentary lifestyle;smoking/ tobacco exposure     Objective:    Today's Vitals   05/16/20 1041 05/16/20 1103  BP: 127/71   Weight: 145 lb (65.8 kg)   Height: 5\' 4"  (1.626 m)   PainSc:  7    Body mass index is 24.89 kg/m.  Advanced Directives 05/16/2020 05/09/2020 04/22/2020 04/22/2020 04/13/2020 04/12/2020 04/04/2020  Does Patient Have a Medical Advance Directive? Yes No No No No No No  Does patient want to make changes to medical advance directive? - - - - - - -  Copy of [place] in Chart? - - - - - - -  Would patient like information on creating a medical advance directive? - No - Patient declined No - Patient declined - No - Patient declined No - Patient declined No - Patient declined  Pre-existing out of facility DNR order (yellow form or pink MOST form) - - - - - - -    Current Medications (verified) Outpatient Encounter Medications as of 05/16/2020  Medication Sig  . acetaminophen (TYLENOL) 325 MG tablet Take 2 tablets (650 mg total) by mouth every 6 (six) hours as needed for mild pain or moderate pain.  [name] Apixaban Starter Pack, 10mg  and 5mg , (ELIQUIS DVT/PE STARTER PACK) Take as directed on package: start with two-5mg  tablets twice daily for 7 days. On day 8, switch to one-5mg  tablet twice daily.  [name] aspirin EC 81 MG tablet Take 1 tablet (81 mg total) by mouth daily.  [name] atorvastatin (LIPITOR) 40 MG tablet Take 1 tablet (40 mg total) by mouth daily.  . busPIRone (BUSPAR) 10 MG tablet Take 1 tablet (10 mg total) by mouth 2 (two) times daily.  . cholecalciferol (VITAMIN D) 1000 units tablet Take 1 tablet (1,000 Units total) by mouth daily.  . clonazePAM (KLONOPIN) 0.5 MG tablet Take 1 tablet (0.5 mg total) by mouth at bedtime.  [name]  escitalopram (LEXAPRO) 10 MG tablet Take 1 tablet (10 mg total) by mouth daily.  . fluticasone (FLONASE) 50 MCG/ACT nasal spray Use 2 spray(s) in each nostril once daily (Patient taking differently: Place 2 sprays into both nostrils daily. )  . furosemide (LASIX) 20 MG tablet Take 1 tablet (20 mg total) by mouth daily.  [name] lisinopril (ZESTRIL) 5 MG tablet Take 1 tablet (5 mg total) by mouth daily.  . magnesium oxide (MAG-OX) 400 (241.3 Mg) MG tablet Take 1 tablet (400 mg total) by mouth 2 (two) times daily.  . metoprolol succinate (TOPROL-XL) 50 MG 24 hr tablet TAKE 1 TABLET BY MOUTH ONCE DAILY WITH  OR  IMMEDIATELY  FOLLOWING  A  MEAL (Patient taking differently: Take 50 mg by mouth daily. WITH  OR  IMMEDIATELY  FOLLOWING  A  MEAL)  . montelukast (SINGULAIR) 10 MG tablet Take 1 tablet (10 mg total) by mouth at bedtime.  . nitroGLYCERIN (NITROSTAT) 0.4 MG SL tablet Place 1 tablet (0.4 mg total) under the tongue every 5 (five) minutes as needed for chest pain.  [name] oxyCODONE (OXY IR/ROXICODONE) 5 MG immediate release tablet Take 1 tablet (5 mg total) by mouth every 4 (four) hours as needed for moderate pain or severe pain.  . pantoprazole (PROTONIX) 40 MG tablet Take 1 tablet (40 mg total) by mouth daily before breakfast.  .  senna-docusate (SENOKOT-S) 8.6-50 MG tablet Take 2 tablets by mouth at bedtime.  [name] spironolactone (ALDACTONE) 25 MG tablet Take 0.5 tablets (12.5 mg total) by mouth daily.  [name] trolamine salicylate (ASPERCREME) 10 % cream Apply 1 application topically daily as needed for muscle pain (for pain in feet). For pain in legs (swelling)  . [DISCONTINUED] potassium chloride (KLOR-CON) 10 MEQ tablet Take 1 tablet (10 mEq total) by mouth every other day. (Patient taking differently: Take 10 mEq by mouth daily. )  . [DISCONTINUED] prochlorperazine (COMPAZINE) 10 MG tablet Take 1 tablet (10 mg total) by mouth every 6 (six) hours as needed (Nausea or vomiting).   No facility-administered encounter  medications on file as of 05/16/2020.    Allergies (verified) Patient has no known allergies.   History: Past Medical History:  Diagnosis Date  . Arterial embolus of lower extremity ([place]) 11/23/2013  . Arthritis   . CAD (coronary artery disease)    a. s/p PCI with 3.5 x 18 BMS to mid RCA, non obst LAD, Lcx patent 08/10/13  . Cancer ([place]) 2021   bladder  . Depression, major, single episode, mild ([place]) 12/20/2019  . ED (erectile dysfunction) 03/27/2015  . Embolus of femoral artery [facility]) 2012   Surgical repair, previously on Coumadin  . Essential hypertension   . ETOH abuse   . Hematuria 10/17/2019  . Hernia of abdominal cavity 02/27/2019  . History of DVT (deep vein thrombosis) 2011  . History of GI bleed    February 2012   . Ingrown left greater toenail 09/04/2018  . 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 Low oxygen saturation 01/31/2020  . 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 Pain with urination 10/17/2019  . Port-A-Cath in place 02/23/2020   right  . Prosthetic valve endocarditis ([place])   . PVD (peripheral vascular disease) ([place])   . S/P aortic valve replacement    Details not clear -  reportedly 1971 per patient   Past Surgical History:  Procedure Laterality Date  . ABDOMINAL AORTOGRAM W/LOWER EXTREMITY N/A 10/31/2018   Procedure: ABDOMINAL AORTOGRAM W/LOWER EXTREMITY;  Surgeon: Lorretta Harp, MD;  Location: [place] CV LAB;  Service: Cardiovascular;  Laterality: N/A;  . Aoric valve replacement  January 2005   5mm Medtronic Freestyle stentless porcine bioprosthesis [facility])  . CARDIAC CATHETERIZATION     30-40% mid LAD, 20-30% ostial D1, proximal 30-40% RCA, 95% mid RCA thrombus, 40% distal stenosis. EF 45%, inferior HK.  [name] COLONOSCOPY N/A 04/20/2014   Procedure: COLONOSCOPY;  Surgeon: Rogene Houston, MD;  [facility];  Service: Endoscopy;  Laterality: N/A;  930  . COLONOSCOPY WITH  PROPOFOL N/A 02/02/2020   Procedure: COLONOSCOPY WITH PROPOFOL;  Surgeon: Rogene Houston, MD;  [facility];  Service: Endoscopy;  Laterality: N/A;  . CORONARY ANGIOPLASTY WITH STENT PLACEMENT  07/2013  . ESOPHAGOGASTRODUODENOSCOPY (EGD) WITH PROPOFOL N/A 02/02/2020   Procedure: ESOPHAGOGASTRODUODENOSCOPY (EGD) WITH PROPOFOL;  Surgeon: Rogene Houston, MD;  [facility];  Service: Endoscopy;  Laterality: N/A;  . INGUINAL HERNIA REPAIR Bilateral 04/17/2019   Procedure: ATTEMPTED LAPAROSCOPIC BILATERAL INGUINAL HERNIA REPAIR;  Surgeon: Coralie Keens, MD;  Location: [place];  Service: General;  Laterality: Bilateral;  . INGUINAL HERNIA REPAIR Bilateral 04/17/2019   Procedure: Hernia Repair Inguinal Adult;  Surgeon: Coralie Keens, MD;  Location: [place];  Service: General;  Laterality: Bilateral;  .  IR IMAGING GUIDED PORT INSERTION  02/22/2020  . IR NEPHROSTOMY EXCHANGE LEFT  04/05/2020  . IR NEPHROSTOMY EXCHANGE RIGHT  04/05/2020  . IR NEPHROSTOMY PLACEMENT LEFT  02/22/2020  . IR NEPHROSTOMY PLACEMENT RIGHT  02/22/2020  . LEFT HEART CATHETERIZATION WITH CORONARY ANGIOGRAM N/A 08/10/2013   Procedure: LEFT HEART CATHETERIZATION WITH CORONARY ANGIOGRAM;  Surgeon: Blane Ohara, MD;  Location: [facility] CATH LAB;  Service: Cardiovascular;  Laterality: N/A;  . Left leg embolectomy  May 2012   Dr. Trula Slade - reason for lifelong coumadin  . LYMPH NODE BIOPSY Left 11/21/2019   Procedure: Left Neck LYMPH NODE BIOPSY;  Surgeon: Rozetta Nunnery, MD;  Location: [place];  Service: ENT;  Laterality: Left;  . PERIPHERAL VASCULAR ATHERECTOMY  10/31/2018   Procedure: PERIPHERAL VASCULAR ATHERECTOMY;  Surgeon: Lorretta Harp, MD;  Location: [place] CV LAB;  Service: Cardiovascular;;  left SFA  . POLYPECTOMY  02/02/2020   Procedure: POLYPECTOMY;  Surgeon: Rogene Houston, MD;  [facility];  Service: Endoscopy;;  splenic flexure   Family History  Problem Relation Age of Onset  .  Diabetes Mother   . Cirrhosis Father   . COPD Sister    Social History   Socioeconomic History  . Marital status: Single    Spouse name: Not on file  . Number of children: 2  . Years of education: Not on file  . Highest education level: 11th grade  Occupational History  . Occupation: disability  Tobacco Use  . Smoking status: Former Smoker    Packs/day: 0.50    Years: 3.00    Pack years: 1.50    Types: Cigarettes    Start date: 10/12/1965    Quit date: 02/19/1970    Years since quitting: 50.2  . Smokeless tobacco: Never Used  Vaping Use  . Vaping Use: Never used  Substance and Sexual Activity  . Alcohol use: Not Currently  . Drug use: Yes    Types: Marijuana    Comment: pt smokes weed once in the morning and once in the evening  . Sexual activity: Not Currently  Other Topics Concern  . Not on file  Social History Narrative  . Not on file   Social Determinants of Health   Financial Resource Strain: Low Risk   . Difficulty of Paying Living Expenses: Not very hard  Food Insecurity: No Food Insecurity  . Worried About Charity fundraiser in the Last Year: Never true  . Ran Out of Food in the Last Year: Never true  Transportation Needs:   . Lack of Transportation (Medical): Not on file  . Lack of Transportation (Non-Medical): Not on file  Physical Activity: Inactive  . Days of Exercise per Week: 0 days  . Minutes of Exercise per Session: 0 min  Stress:   . Feeling of Stress : Not on file  Social Connections: Moderately Isolated  . Frequency of Communication with Friends and Family: Twice a week  . Frequency of Social Gatherings with Friends and Family: Twice a week  . Attends Religious Services: Never  . Active Member of Clubs or Organizations: Yes  . Attends Archivist Meetings: Never  . Marital Status: Divorced    Tobacco Counseling Counseling given: Not Answered   Clinical Intake:  Pre-visit preparation completed: Yes  Pain : 0-10 Pain Score:  7  Pain Type: Chronic pain Pain Location: Generalized     Nutritional Status: BMI of 19-24  Normal Diabetes: No  Diabetic? no         Activities of Daily Living In your present state of health, do you have any difficulty performing the following activities: 05/16/2020 04/22/2020  Hearing? N N  Vision? N N  Comment - -  Difficulty concentrating or making decisions? N N  Walking or climbing stairs? Y Y  Dressing or bathing? Y Y  Doing errands, shopping? Tempie Donning  Preparing Food and eating ? Y -  Using the Toilet? N -  In the past six months, have you accidently leaked urine? N -  Do you have problems with loss of bowel control? N -  Managing your Medications? Y -  Managing your Finances? Y -  Housekeeping or managing your Housekeeping? Y -  Some recent data might be hidden    Patient Care Team: [name], MD as PCP - General (Family Medicine) Satira Sark, MD as PCP - Cardiology (Cardiology) [name], Lavell Islam, MD (Infectious Diseases) Donetta Potts, RN as Oncology Nurse Navigator (Oncology) Derek Jack, MD as Medical Oncologist (Oncology) [name], RN as [place] any recent [place] you may have received from other than [facility] providers in the past year (date may be approximate).     Assessment:   This is a routine wellness examination for [place].  Hearing/Vision screen No exam data present  Dietary issues and exercise activities discussed: Current Exercise Habits: The patient does not participate in regular exercise at present, Exercise limited by: respiratory conditions(s);orthopedic condition(s) (recently released from [facility] long term care)  Goals    .  maintain current exercise routine    .  Stay out of the hospital (pt-stated)      [place] (see longtitudinal plan of care for additional care plan information)   Current Barriers:  [name] Knowledge deficit related  to basic heart failure pathophysiology and self care management . Knowledge Deficits related to heart failure medications  Case Manager Clinical Goal(s):  [name] Over the next 31 days, patient will verbalize understanding of Heart Failure Action Plan and when to call doctor . Over the next 28 days, patient will weigh daily and record (notifying MD of 3 lb weight gain over night or 5 lb in a week) . Patient will verbalize two symptoms of CHF exacerbation within the next 28 days.    Interventions:  . Basic overview and discussion of pathophysiology of Heart Failure reviewed  . Assessed need for readable accurate scales in home . Provided education about placing scale on hard, flat surface . Advised patient to weigh each morning after emptying bladder  Patient Self Care Activities:  . Takes Heart Failure Medications as prescribed . Weighs daily and record (notifying MD of 3 lb weight gain over night or 5 lb in a week) . Verbalizes understanding of and follows CHF Action Plan  Initial goal documentation       Depression Screen PHQ 2/9 Scores 03/19/2020 12/20/2019 10/17/2019 09/12/2019 05/25/2019 05/17/2019 03/08/2019  PHQ - 2 Score 0 2 0 0 0 0 0  PHQ- 9 Score 0 7 - - - - -    Fall Risk Fall Risk  05/16/2020 03/19/2020 10/17/2019 09/12/2019 07/06/2019  Falls in the past year? 0 0 0 0 0  Number falls in past yr: 0 0 0 0 0  Injury with Fall? 0 0 0 0 0  Risk for fall due to : - No Fall Risks - - -  Follow up -  Falls evaluation completed - - -    Any stairs in or around the home? No  If so, are there any without handrails? No  Home free of loose throw rugs in walkways, pet beds, electrical cords, etc? Yes  Adequate lighting in your home to reduce risk of falls? Yes   ASSISTIVE DEVICES UTILIZED TO PREVENT FALLS:  Life alert? No  Use of a cane, walker or w/c? Yes  Grab bars in the bathroom? No  Shower chair or bench in shower? No  Elevated toilet seat or a handicapped toilet? No   TIMED UP  AND GO:  Was the test performed? No .  Length of time to ambulate 10 feet:  sec.   In wheelchair. Weak from recent hospitalization  Cognitive Function:     6CIT Screen 05/16/2020 05/17/2019 05/09/2018 05/19/2017  What Year? 0 points 0 points 0 points 0 points  What month? 0 points 0 points 0 points 0 points  What time? 0 points 0 points 0 points 0 points  Count back from 20 0 points 0 points 0 points 0 points  Months in reverse 2 points 0 points 0 points 0 points  Repeat phrase 2 points 0 points 2 points 0 points  Total Score 4 0 2 0    Immunizations Immunization History  Administered Date(s) Administered  . Fluad Quad(high Dose 65+) 05/25/2019  . Influenza Split 06/11/2011, 06/07/2012  . Influenza,inj,Quad PF,6+ Mos 10/18/2013, 07/04/2014, 07/30/2015, 05/12/2016, 05/19/2017, 05/09/2018  . Moderna SARS-COVID-2 Vaccination 12/27/2019, 01/24/2020  . Pneumococcal Conjugate-13 12/12/2014  . Pneumococcal Polysaccharide-23 12/30/2016  . Tdap 10/18/2013    TDAP status: Up to date Flu Vaccine status: Up to dategiven today  Pneumococcal vaccine status: Up to date Covid-19 vaccine status: Completed vaccines  Qualifies for Shingles Vaccine? Yes   Zostavax completed No   Shingrix Completed?: No.    Education has been provided regarding the importance of this vaccine. Patient has been advised to call insurance company to determine out of pocket expense if they have not yet received this vaccine. Advised may also receive vaccine at local pharmacy or Health Dept. Verbalized acceptance and understanding.  Screening Tests Health Maintenance  Topic Date Due  . INFLUENZA VACCINE  06/20/2020 (Originally 04/21/2020)  . TETANUS/TDAP  10/19/2023  . COLONOSCOPY  02/01/2030  . COVID-19 Vaccine  Completed  . Hepatitis C Screening  Completed  . PNA vac Low Risk Adult  Completed    Health Maintenance  There are no preventive care reminders to display for this patient.  Colorectal cancer  screening: Completed yes. Repeat every 10 years  Lung Cancer Screening: (Low Dose CT Chest recommended if Age 49-80 years, 30 pack-year currently smoking OR have quit w/in 15years.) does not qualify.   Lung Cancer Screening Referral: no  Additional Screening:  Hepatitis C Screening: does qualify; Completed yes  Vision Screening: Recommended annual ophthalmology exams for early detection of glaucoma and other disorders of the eye. Is the patient up to date with their annual eye exam?  no Who is the provider or what is the name of the office in which the patient attends annual eye exams? Hasn't had eye exam in awhile- a lot of health problems recently If pt is not established with a provider, would they like to be referred to a provider to establish care? No .   Dental Screening: Recommended annual dental exams for proper oral hygiene  Community Resource Referral / Chronic Care Management: CRR required this visit?  No  CCM required this visit?  No      Plan:     I have personally reviewed and noted the following in the patient's chart:   . Medical and social history . Use of alcohol, tobacco or illicit drugs  . Current medications and supplements . Functional ability and status . Nutritional status . Physical activity . Advanced directives . List of other physicians . Hospitalizations, surgeries, and ER visits in previous 12 months . Vitals . Screenings to include cognitive, depression, and falls . Referrals and appointments  In addition, I have reviewed and discussed with patient certain preventive protocols, quality metrics, and best practice recommendations. A written personalized care plan for preventive services as well as general preventive health recommendations were provided to patient.     Kate Sable, LPN, LPN   [id number]/[id number]   Nurse Notes: Visit performed in person in office, provider in office. Time spent with patient 30 mins

## 2020-05-16 NOTE — ED Notes (Signed)
Gave patient a throw up bag,gave blanket made him comfortable. Now has emesis.

## 2020-05-16 NOTE — ED Triage Notes (Signed)
Pt with kidney cancer and with sob off and on for 8 months.  [facility] called out for sob, pt on O2 [place] due to RA sat was 91 %, EMS stated they removed his O2 and was 100% on RA.  95 % on RA in triage.

## 2020-05-16 NOTE — Addendum Note (Signed)
Addended by: Eual Fines on: 05/16/2020 11:44 AM   Modules accepted: Orders

## 2020-05-16 NOTE — Plan of Care (Signed)

## 2020-05-16 NOTE — ED Provider Notes (Signed)
[facility] EMERGENCY DEPARTMENT Provider Note   CSN: [id number] Arrival date & time: 05/16/20  1150     History Chief Complaint  Patient presents with  . Shortness of Breath    [name] is a 69 y.o. male.  HPI   This patient is a 69 year old male chronically ill with bladder cancer as well as a history of hypertension, DVT and alcohol abuse.  He has known coronary disease status post stenting and is currently taking medications including Lasix 20 mg twice a day, Eliquis twice a day, lisinopril and metoprolol as well as a history of oxycodone for chronic pain.  He presents to the hospital today with a complaint of swelling.  He has a known history of congestive heart failure and his last echocardiogram showed an ejection fraction of 25 to 30% with severely decreased left ventricular function and global hypokinesis.  The patient states he cannot lay down, he is severely dyspneic with even 2 steps out of his chair, he has been taking his medications as prescribed and continues to urinate but continues to swell in his legs and is now feeling more and more short of breath.  No fevers, no chills, no coughing, no back pain, no chest pain.  Symptoms are persistent and gradually worsening.  I have reviewed the medical record at length and this includes prior admission to the hospital which was about 3 weeks ago for the same thing as well as prior echocardiogram which was performed during that admission.  Past Medical History:  Diagnosis Date  . Arterial embolus of lower extremity ([place]) 11/23/2013  . Arthritis   . CAD (coronary artery disease)    a. s/p PCI with 3.5 x 18 BMS to mid RCA, non obst LAD, Lcx patent 08/10/13  . Cancer ([place]) 2021   bladder  . Depression, major, single episode, mild ([name]) 12/20/2019  . ED (erectile dysfunction) 03/27/2015  . Embolus of femoral artery [name]) 2012   Surgical repair, previously on Coumadin  . Essential hypertension   . ETOH abuse   . Hematuria  10/17/2019  . Hernia of abdominal cavity 02/27/2019  . History of DVT (deep vein thrombosis) 2011  . History of GI bleed    February 2012   . Ingrown left greater toenail 09/04/2018  . 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 Low oxygen saturation 01/31/2020  . Mycotic aneurysm ([name])    Associated with GI bleed and hepatic artery aneurysm status post embolization February 2012  . Pain with urination 10/17/2019  . Port-A-Cath in place 02/23/2020   right  . Prosthetic valve endocarditis ([name])   . PVD (peripheral vascular disease) ([name])   . S/P aortic valve replacement    Details not clear -  reportedly 1971 per patient    Patient Active Problem List   Diagnosis Date Noted  . Acute on chronic combined systolic and diastolic CHF (congestive heart failure) ([place]) 05/16/2020  . DNR (do not resuscitate)   . Palliative care by specialist   . Pulmonary embolism ([place]) 04/22/2020  . Acute on chronic diastolic (congestive) heart failure ([name]) 04/21/2020  . Hyponatremia   . Transaminitis   . CHF exacerbation ([place]) 04/12/2020  . Hydronephrosis 04/01/2020  . Malignant neoplasm of overlapping sites of bladder ([name]) 03/29/2020  . Iron deficiency anemia 03/27/2020  . Nausea with vomiting 03/27/2020  . Encounter for support and coordination of transition of care 03/19/2020  . UTI (urinary tract infection) 03/06/2020  . Port-A-Cath in place  02/23/2020  . Hydrocele 02/21/2020  . Metastatic urothelial carcinoma ([name]) 02/13/2020  . Goals of care, counseling/discussion 02/13/2020  . Chronic anemia 01/31/2020  . Acute on chronic HFrEF (heart failure with reduced ejection fraction) -combined systolic and diastolic dysfunction CHF 16/06/9603  . Nervously anxious 12/20/2019  . Depression, major, single episode, mild ([name]) 12/20/2019  . Weight loss, non-intentional 10/17/2019  . Annual visit for general adult medical examination with abnormal findings 09/12/2019  . Supraclavicular lymphadenopathy 09/12/2019  .  Grade 3 out of 6 intensity murmur 09/12/2019  . Bilateral recurrent inguinal hernias 03/08/2019  . Claudication in peripheral vascular disease ([name]) 10/31/2018  . Peripheral arterial disease ([place]) 10/26/2018  . Shortness of breath on exertion 10/16/2017  . Severe aortic insufficiency 10/02/2017  . Allergic rhinitis 12/30/2015  . Hyperlipidemia LDL goal <70 08/25/2013  . Aortic atherosclerosis ([name]) 08/12/2013  . Coronary atherosclerosis of native coronary artery 08/10/2013  . Hepatic artery aneurysm ([name]) 12/10/2010  . S/P aortic valve replacement   . Essential hypertension, benign 05/13/2010    Past Surgical History:  Procedure Laterality Date  . ABDOMINAL AORTOGRAM W/LOWER EXTREMITY [name] 10/31/2018   Procedure: ABDOMINAL AORTOGRAM W/LOWER EXTREMITY;  Surgeon: Bruce Harp, MD;  Location: [place] CV LAB;  Service: Cardiovascular;  Laterality: [name];  . Aoric valve replacement  January 2005   26mm Medtronic Freestyle stentless porcine bioprosthesis [facility])  . CARDIAC CATHETERIZATION     30-40% mid LAD, 20-30% ostial D1, proximal 30-40% RCA, 95% mid RCA thrombus, 40% distal stenosis. EF 45%, inferior HK.  [name] COLONOSCOPY [name] 04/20/2014   Procedure: COLONOSCOPY;  Surgeon: Bruce Houston, MD;  [facility];  Service: Endoscopy;  Laterality: [name];  930  . COLONOSCOPY WITH PROPOFOL [name] 02/02/2020   Procedure: COLONOSCOPY WITH PROPOFOL;  Surgeon: Bruce Houston, MD;  [facility];  Service: Endoscopy;  Laterality: [name];  . CORONARY ANGIOPLASTY WITH STENT PLACEMENT  07/2013  . ESOPHAGOGASTRODUODENOSCOPY (EGD) WITH PROPOFOL [name] 02/02/2020   Procedure: ESOPHAGOGASTRODUODENOSCOPY (EGD) WITH PROPOFOL;  Surgeon: Bruce Houston, MD;  [facility];  Service: Endoscopy;  Laterality: [name];  . INGUINAL HERNIA REPAIR Bilateral 04/17/2019   Procedure: ATTEMPTED LAPAROSCOPIC BILATERAL INGUINAL HERNIA REPAIR;  Surgeon: Coralie Keens, MD;  Location: [place];  Service:  General;  Laterality: Bilateral;  . INGUINAL HERNIA REPAIR Bilateral 04/17/2019   Procedure: Hernia Repair Inguinal Adult;  Surgeon: Coralie Keens, MD;  Location: [place];  Service: General;  Laterality: Bilateral;  . IR IMAGING GUIDED PORT INSERTION  02/22/2020  . IR NEPHROSTOMY EXCHANGE LEFT  04/05/2020  . IR NEPHROSTOMY EXCHANGE RIGHT  04/05/2020  . IR NEPHROSTOMY PLACEMENT LEFT  02/22/2020  . IR NEPHROSTOMY PLACEMENT RIGHT  02/22/2020  . LEFT HEART CATHETERIZATION WITH CORONARY ANGIOGRAM [name] 08/10/2013   Procedure: LEFT HEART CATHETERIZATION WITH CORONARY ANGIOGRAM;  Surgeon: Blane Ohara, MD;  Location: [facility] CATH LAB;  Service: Cardiovascular;  Laterality: [name];  . Left leg embolectomy  May 2012   Dr. Trula Slade - reason for lifelong coumadin  . LYMPH NODE BIOPSY Left 11/21/2019   Procedure: Left Neck LYMPH NODE BIOPSY;  Surgeon: Rozetta Nunnery, MD;  Location: [place];  Service: ENT;  Laterality: Left;  . PERIPHERAL VASCULAR ATHERECTOMY  10/31/2018   Procedure: PERIPHERAL VASCULAR ATHERECTOMY;  Surgeon: Bruce Harp, MD;  Location: [place] CV LAB;  Service: Cardiovascular;;  left SFA  . POLYPECTOMY  02/02/2020   Procedure: POLYPECTOMY;  Surgeon: Bruce Houston, MD;  [facility];  Service: Endoscopy;;  splenic flexure       Family History  Problem Relation Age of Onset  . Diabetes Mother   . Cirrhosis Father   . COPD Sister     Social History   Tobacco Use  . Smoking status: Former Smoker    Packs/day: 0.50    Years: 3.00    Pack years: 1.50    Types: Cigarettes    Start date: 10/12/1965    Quit date: 02/19/1970    Years since quitting: 50.2  . Smokeless tobacco: Never Used  Vaping Use  . Vaping Use: Never used  Substance Use Topics  . Alcohol use: Not Currently  . Drug use: Yes    Types: Marijuana    Comment: pt smokes weed once in the morning and once in the evening    Home Medications Prior to Admission medications   Medication Sig Start Date End  Date Taking? Authorizing Provider  acetaminophen (TYLENOL) 325 MG tablet Take 2 tablets (650 mg total) by mouth every 6 (six) hours as needed for mild pain or moderate pain. 04/25/20   Roxan Hockey, MD  Apixaban Starter Pack, 10mg  and 5mg , (ELIQUIS DVT/PE STARTER PACK) Take as directed on package: start with two-5mg  tablets twice daily for 7 days. On day 8, switch to one-5mg  tablet twice daily. 04/25/20   Roxan Hockey, MD  aspirin EC 81 MG tablet Take 1 tablet (81 mg total) by mouth daily. 11/01/17   [name], MD  atorvastatin (LIPITOR) 40 MG tablet Take 1 tablet (40 mg total) by mouth daily. 03/21/20   Perlie Mayo, NP  busPIRone (BUSPAR) 10 MG tablet Take 1 tablet (10 mg total) by mouth 2 (two) times daily. 12/20/19   Perlie Mayo, NP  cholecalciferol (VITAMIN D) 1000 units tablet Take 1 tablet (1,000 Units total) by mouth daily. 11/01/17   [name], MD  clonazePAM (KLONOPIN) 0.5 MG tablet Take 1 tablet (0.5 mg total) by mouth at bedtime. 04/25/20   Roxan Hockey, MD  escitalopram (LEXAPRO) 10 MG tablet Take 1 tablet (10 mg total) by mouth daily. 12/20/19   Perlie Mayo, NP  fluticasone (FLONASE) 50 MCG/ACT nasal spray Use 2 spray(s) in each nostril once daily Patient taking differently: Place 2 sprays into both nostrils daily.  03/22/20   Perlie Mayo, NP  furosemide (LASIX) 20 MG tablet Take 1 tablet (20 mg total) by mouth daily. 04/25/20   Roxan Hockey, MD  lisinopril (ZESTRIL) 5 MG tablet Take 1 tablet (5 mg total) by mouth daily. 04/25/20 06/24/20  Roxan Hockey, MD  magnesium oxide (MAG-OX) 400 (241.3 Mg) MG tablet Take 1 tablet (400 mg total) by mouth 2 (two) times daily. 04/04/20   Derek Jack, MD  metoprolol succinate (TOPROL-XL) 50 MG 24 hr tablet TAKE 1 TABLET BY MOUTH ONCE DAILY WITH  OR  IMMEDIATELY  FOLLOWING  A  MEAL Patient taking differently: Take 50 mg by mouth daily. WITH  OR  IMMEDIATELY  FOLLOWING  A  MEAL 03/22/20   Perlie Mayo, NP    Misc. Devices (ADJUSTABLE COMMODE 3-IN-1) MISC Dx R26.81 05/16/20   [name], MD  montelukast (SINGULAIR) 10 MG tablet Take 1 tablet (10 mg total) by mouth at bedtime. 03/22/20   Perlie Mayo, NP  nitroGLYCERIN (NITROSTAT) 0.4 MG SL tablet Place 1 tablet (0.4 mg total) under the tongue every 5 (five) minutes as needed for chest pain. 12/20/19   Bruce Harp, MD  oxyCODONE (  OXY IR/ROXICODONE) 5 MG immediate release tablet Take 1 tablet (5 mg total) by mouth every 4 (four) hours as needed for moderate pain or severe pain. 04/25/20   Roxan Hockey, MD  pantoprazole (PROTONIX) 40 MG tablet Take 1 tablet (40 mg total) by mouth daily before breakfast. 02/03/20   Amin, Jeanella Flattery, MD  senna-docusate (SENOKOT-S) 8.6-50 MG tablet Take 2 tablets by mouth at bedtime. 04/25/20 04/25/21  Roxan Hockey, MD  spironolactone (ALDACTONE) 25 MG tablet Take 0.5 tablets (12.5 mg total) by mouth daily. 04/26/20   Roxan Hockey, MD  trolamine salicylate (ASPERCREME) 10 % cream Apply 1 application topically daily as needed for muscle pain (for pain in feet). For pain in legs (swelling)    [provider]  prochlorperazine (COMPAZINE) 10 MG tablet Take 1 tablet (10 mg total) by mouth every 6 (six) hours as needed (Nausea or vomiting). 03/28/20 05/09/20  Derek Jack, MD    Allergies    Patient has no known allergies.  Review of Systems   Review of Systems  All other systems reviewed and are negative.   Physical Exam Updated Vital Signs BP 139/82 (BP Location: Right Arm)   Pulse 84   Temp 97.6 F (36.4 C) (Temporal)   Resp 18   Ht 1.626 m (5\' 4" )   Wt 68 kg   SpO2 97%   BMI 25.75 kg/m   Physical Exam Vitals and nursing note reviewed.  Constitutional:      Appearance: He is well-developed. He is ill-appearing.  HENT:     Head: Normocephalic and atraumatic.     Mouth/Throat:     Pharynx: No oropharyngeal exudate.  Eyes:     General: No scleral icterus.       Right eye:  No discharge.        Left eye: No discharge.     Conjunctiva/sclera: Conjunctivae normal.     Pupils: Pupils are equal, round, and reactive to light.  Neck:     Thyroid: No thyromegaly.     Vascular: No JVD.     Comments: JVD in the upright position to the level of the angle of the mandible Cardiovascular:     Rate and Rhythm: Normal rate and regular rhythm.     Heart sounds: Normal heart sounds. No murmur heard.  No friction rub. No gallop.   Pulmonary:     Effort: Pulmonary effort is normal. No respiratory distress.     Breath sounds: Rales present. No wheezing.     Comments: Subtle rales at the bases, increased work of breathing which is mild, mild tachypnea, speaks in just short and sentences Abdominal:     General: Bowel sounds are normal. There is no distension.     Palpations: Abdomen is soft. There is no mass.     Tenderness: There is no abdominal tenderness.  Musculoskeletal:        General: No tenderness. Normal range of motion.     Cervical back: Normal range of motion and neck supple.     Right lower leg: Edema present.     Left lower leg: Edema present.     Comments: Bilateral symmetrical pitting edema above his knees through the feet  Lymphadenopathy:     Cervical: No cervical adenopathy.  Skin:    General: Skin is warm and dry.     Findings: No erythema or rash.  Neurological:     Mental Status: He is alert.     Coordination: Coordination normal.  Comments: Awake alert and answers questions appropriately, generally weak, no focality to the weakness  Psychiatric:        Behavior: Behavior normal.     ED Results / Procedures / Treatments   Labs (all labs ordered are listed, but only abnormal results are displayed) Labs Reviewed  BASIC METABOLIC PANEL - Abnormal; Notable for the following components:      Result Value   Sodium 126 (*)    Chloride 90 (*)    Glucose, Bld 105 (*)    BUN 29 (*)    Calcium 8.4 (*)    All other components within normal  limits  BRAIN NATRIURETIC PEPTIDE - Abnormal; Notable for the following components:   B Natriuretic Peptide >4,500.0 (*)    All other components within normal limits  CBC WITH DIFFERENTIAL/PLATELET - Abnormal; Notable for the following components:   Hemoglobin 11.5 (*)    HCT 37.3 (*)    RDW 25.3 (*)    Monocytes Absolute 1.2 (*)    Abs Immature Granulocytes 0.14 (*)    All other components within normal limits    EKG EKG Interpretation  Date/Time:  Thursday May 16 2020 17:20:38 EDT Ventricular Rate:  89 PR Interval:  262 QRS Duration: 92 QT Interval:  392 QTC Calculation: 476 R Axis:   -91 Text Interpretation: Sinus rhythm with 1st degree A-V block Right superior axis deviation Incomplete right bundle branch block Minimal voltage criteria for LVH, may be normal variant ( Cornell product ) Nonspecific ST abnormality Abnormal ECG borderline prolonged QT Confirmed by Noemi Chapel [phone]) on 05/16/2020 5:35:17 PM   Radiology DG Chest Port 1 View  Result Date: 05/16/2020 CLINICAL DATA:  Shortness of breath. EXAM: PORTABLE CHEST 1 VIEW COMPARISON:  April 21, 2020. FINDINGS: Stable cardiomegaly. No pneumothorax is noted. Status post aortic valve repair. Right internal jugular Port-A-Cath is unchanged in position. Left lung is clear. Mild right pleural effusion is noted with associated right basilar atelectasis or infiltrate. Bony thorax is unremarkable. IMPRESSION: Mild right pleural effusion is noted with associated right basilar atelectasis or infiltrate. Electronically Signed   By: Marijo Conception M.D.   On: 05/16/2020 13:22    Procedures .Critical Care Performed by: Noemi Chapel, MD Authorized by: Noemi Chapel, MD   Critical care provider statement:    Critical care time (minutes):  35   Critical care time was exclusive of:  Separately billable procedures and treating other patients and teaching time   Critical care was necessary to treat or prevent imminent or  life-threatening deterioration of the following conditions:  Respiratory failure and cardiac failure   Critical care was time spent personally by me on the following activities:  Blood draw for specimens, development of treatment plan with patient or surrogate, discussions with consultants, evaluation of patient's response to treatment, examination of patient, obtaining history from patient or surrogate, ordering and performing treatments and interventions, ordering and review of laboratory studies, ordering and review of radiographic studies, pulse oximetry, re-evaluation of patient's condition and review of old charts   (including critical care time)  Medications Ordered in ED Medications  furosemide (LASIX) injection 40 mg (has no administration in time range)    ED Course  I have reviewed the triage vital signs and the nursing notes.  Pertinent labs & imaging results that were available during my care of the patient were reviewed by me and considered in my medical decision making (see chart for details).    MDM Rules/Calculators/A&P  This patient does appear significantly fluid overloaded, that being said blood pressure is 139/82, temperature of 97.6 with a pulse of 84 respirations of 18 and sats of 97% on room air.  X-ray shows a slight pleural effusion on the right, there is no significant infiltrate or pneumothorax.  I do not see an overwhelming amount of pulmonary edema.  There is a prior sternotomy wires present, he has significant cardiomegaly in the right side of the heart is obscured by the effusion.  Port-A-Cath is a seems to be in place, no subdiaphragmatic air.  The patient is hyponatremic to 126, kidneys seem to be preserved with a creatinine of 0.8 and a BUN of 29.  CBC shows no leukocytosis or significant anemia, normal platelets.  BNP is on measurably high at 4500 or more.  I suspect this patient is acute on chronic congestive heart failure given his  severe dyspnea and fluid overload he will likely need to be admitted back to the hospital.  If he is truly taking 20 mg of Lasix twice a day we definitely have room to go up on that.  We will discussed the care with the hospitalist  Gust the case with Dr. Denton Brick who will admit  MACLOVIO HENSON was evaluated in Emergency Department on 05/16/2020 for the symptoms described in the history of present illness. He was evaluated in the context of the global COVID-19 pandemic, which necessitated consideration that the patient might be at risk for infection with the SARS-CoV-2 virus that causes COVID-19. Institutional protocols and algorithms that pertain to the evaluation of patients at risk for COVID-19 are in a state of rapid change based on information released by regulatory bodies including the CDC and federal and state organizations. These policies and algorithms were followed during the patient's care in the ED.   Final Clinical Impression(s) / ED Diagnoses Final diagnoses:  Acute on chronic systolic congestive heart failure (HCC)      Noemi Chapel, MD 05/16/20 1736

## 2020-05-17 ENCOUNTER — Encounter: Payer: Medicare Other | Admitting: Family Medicine

## 2020-05-17 DIAGNOSIS — I5043 Acute on chronic combined systolic (congestive) and diastolic (congestive) heart failure: Secondary | ICD-10-CM

## 2020-05-17 DIAGNOSIS — I2699 Other pulmonary embolism without acute cor pulmonale: Secondary | ICD-10-CM

## 2020-05-17 DIAGNOSIS — C678 Malignant neoplasm of overlapping sites of bladder: Secondary | ICD-10-CM

## 2020-05-17 DIAGNOSIS — I2510 Atherosclerotic heart disease of native coronary artery without angina pectoris: Secondary | ICD-10-CM

## 2020-05-17 DIAGNOSIS — I2782 Chronic pulmonary embolism: Secondary | ICD-10-CM

## 2020-05-17 DIAGNOSIS — Z952 Presence of prosthetic heart valve: Secondary | ICD-10-CM

## 2020-05-17 DIAGNOSIS — C689 Malignant neoplasm of urinary organ, unspecified: Secondary | ICD-10-CM

## 2020-05-17 DIAGNOSIS — I10 Essential (primary) hypertension: Secondary | ICD-10-CM

## 2020-05-17 LAB — BASIC METABOLIC PANEL
Anion gap: 15 (ref 5–15)
BUN: 33 mg/dL — ABNORMAL HIGH (ref 8–23)
CO2: 20 mmol/L — ABNORMAL LOW (ref 22–32)
Calcium: 8.3 mg/dL — ABNORMAL LOW (ref 8.9–10.3)
Chloride: 91 mmol/L — ABNORMAL LOW (ref 98–111)
Creatinine, Ser: 0.9 mg/dL (ref 0.61–1.24)
GFR calc Af Amer: >60 mL/min (ref >60)
GFR calc non Af Amer: >60 mL/min (ref >60)
Glucose, Bld: 100 mg/dL — ABNORMAL HIGH (ref 70–99)
Potassium: 4.9 mmol/L (ref 3.5–5.1)
Sodium: 126 mmol/L — ABNORMAL LOW (ref 135–145)

## 2020-05-17 LAB — TROPONIN I (HIGH SENSITIVITY): Troponin I (High Sensitivity): 61 ng/L — ABNORMAL HIGH (ref <18)

## 2020-05-17 MED ORDER — SPIRONOLACTONE 25 MG PO TABS
12.5000 mg | ORAL_TABLET | Freq: Every day | ORAL | Status: DC
  Administered 2020-05-17 – 2020-05-21 (×5): 12.5 mg via ORAL
  Filled 2020-05-17: qty 0.5
  Filled 2020-05-17: qty 1
  Filled 2020-05-17: qty 0.5
  Filled 2020-05-17: qty 1
  Filled 2020-05-17 (×4): qty 0.5
  Filled 2020-05-17: qty 1

## 2020-05-17 MED ORDER — OXYCODONE HCL 5 MG PO TABS
5.0000 mg | ORAL_TABLET | Freq: Four times a day (QID) | ORAL | Status: DC | PRN
  Administered 2020-05-17 – 2020-05-30 (×33): 5 mg via ORAL
  Filled 2020-05-17 (×34): qty 1

## 2020-05-17 MED ORDER — FUROSEMIDE 10 MG/ML IJ SOLN
80.0000 mg | Freq: Two times a day (BID) | INTRAMUSCULAR | Status: DC
  Administered 2020-05-17 – 2020-05-20 (×6): 80 mg via INTRAVENOUS
  Filled 2020-05-17 (×7): qty 8

## 2020-05-17 MED ORDER — MORPHINE SULFATE (PF) 2 MG/ML IV SOLN
2.0000 mg | INTRAVENOUS | Status: DC | PRN
  Administered 2020-05-18 – 2020-05-29 (×9): 2 mg via INTRAVENOUS
  Filled 2020-05-17 (×9): qty 1

## 2020-05-17 MED ORDER — METHOCARBAMOL 500 MG PO TABS
500.0000 mg | ORAL_TABLET | Freq: Four times a day (QID) | ORAL | Status: DC | PRN
  Administered 2020-05-17 – 2020-05-29 (×17): 500 mg via ORAL
  Filled 2020-05-17 (×17): qty 1

## 2020-05-17 MED ORDER — FUROSEMIDE 10 MG/ML IJ SOLN
40.0000 mg | Freq: Once | INTRAMUSCULAR | Status: AC
  Administered 2020-05-17: 40 mg via INTRAVENOUS
  Filled 2020-05-17: qty 4

## 2020-05-17 NOTE — Consult Note (Addendum)
Cardiology Consult    Patient ID: [name]; [id number]; 04/03/1951   Admit date: 05/16/2020 Date of Consult: 05/17/2020  Primary Care Provider: [name], MD Primary Cardiologist: Rozann Lesches, MD   Patient Profile    [name] is a 69 y.o. male with past medical history of CAD (s/p BMS to RCA in 2014), AVR (s/p bioprosthetic AVR and ultimately valve in valve TAVR in 09/2017 due to torn coronary cusp of his previously placed bioprosthetic AVR), chronic combined systolic and diastolic CHF (EF 49-44% by echo in 12/2019, at 20-25% by repeat echo in 04/2020), PAD (s/p left SFA intervention in 10/2018), HTN, HLD, recently diagnosed bilateral PE, metastatic urothelial cancer (diagnosed in 10/2019), chronic anemia and history of DVT who is being seen today for the evaluation of CHF at the request of Dr. Starla Link.   History of Present Illness    [name] was recently admitted to [facility] from 04/21/2020 to 04/25/2020 and dyspnea and was found to have bilateral pulmonary emboli and was started on IV heparin and transitioned to Eliquis at the time of discharge. A repeat echocardiogram was obtained during admission and his EF was further reduced to 25 to 30%. 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  He presented back to [facility] ED on 05/16/2020 for evaluation of worsening dyspnea and oxygen desaturations. Reports having shortness of breath at rest and with activity with orthopnea and PND. No specific chest pain or palpitations. He has experienced worsening lower extremity edema. Initial labs showed WBC 10.4, Hgb 11.5, platelets 192, Na+ 126, K+ 4.7 and creatinine 0.82. BNP > 4500. COVID negative. Initial HS Troponin 36 with repeat of 61. CXR showed mild right pleural effusion with associated right basilar atelectasis or infiltrate. EKG  shows NSR, HR 89 with LVH and ST depression along the lateral leads which is similar to prior tracings.   He was admitted for an acute CHF exacerbation and started on IV Lasix 74m BID. Weight recorded as 150 lbs on admission and was at 143 lbs on 04/25/2020 (at 145 lbs during his office visit on 05/09/2020).    Past Medical History:  Diagnosis Date   Arterial embolus of lower extremity ([place] 11/23/2013   Arthritis    CAD (coronary artery disease)    a. 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 February 2012   Pain with urination 10/17/2019   Port-A-Cath in place 02/23/2020   right   Prosthetic valve endocarditis (HCC)    PVD (peripheral vascular disease) (HCC)    S/P aortic valve replacement    Details not clear -  reportedly 1971 per patient    Past Surgical History:  Procedure Laterality Date   ABDOMINAL AORTOGRAM W/LOWER EXTREMITY N/A 10/31/2018   Procedure: ABDOMINAL AORTOGRAM W/LOWER EXTREMITY;  Surgeon: Lorretta Harp, MD;  Location: [place] [facility];  Service: Cardiovascular;  Laterality: N/A;   Aoric valve replacement  January 2005   58m Medtronic Freestyle stentless porcine bioprosthesis ([facility]   CARDIAC CATHETERIZATION     30-40% mid LAD, 20-30% ostial D1, proximal 30-40% RCA, 95% mid RCA thrombus, 40% distal  stenosis. EF 45%, inferior HK.   COLONOSCOPY N/A 04/20/2014   Procedure: COLONOSCOPY;  Surgeon: NRogene Houston MD;  [facility];  Service: Endoscopy;  Laterality: N/A;  930   COLONOSCOPY WITH PROPOFOL N/A 02/02/2020   Procedure: COLONOSCOPY WITH PROPOFOL;  Surgeon: RRogene Houston MD;  [facility];  Service: Endoscopy;  Laterality: N/A;   CORONARY ANGIOPLASTY WITH STENT PLACEMENT  07/2013   ESOPHAGOGASTRODUODENOSCOPY (EGD) WITH PROPOFOL N/A 02/02/2020   Procedure: ESOPHAGOGASTRODUODENOSCOPY (EGD) WITH PROPOFOL;  Surgeon: RRogene Houston MD;  [facility];  Service: Endoscopy;  Laterality: N/A;   INGUINAL HERNIA REPAIR Bilateral 04/17/2019   Procedure: ATTEMPTED LAPAROSCOPIC BILATERAL INGUINAL HERNIA REPAIR;  Surgeon: BCoralie Keens MD;  [place] [facility]  Service: General;  Laterality: Bilateral;   INGUINAL HERNIA REPAIR Bilateral 04/17/2019   Procedure: Hernia Repair Inguinal Adult;  Surgeon: BCoralie Keens MD;  [place] [facility]  Service: General;  Laterality: Bilateral;   IR IMAGING GUIDED PORT INSERTION  02/22/2020   IR NEPHROSTOMY EXCHANGE LEFT  04/05/2020   IR NEPHROSTOMY EXCHANGE RIGHT  04/05/2020   IR NEPHROSTOMY PLACEMENT LEFT  02/22/2020   IR NEPHROSTOMY PLACEMENT RIGHT  02/22/2020   LEFT HEART CATHETERIZATION WITH CORONARY ANGIOGRAM N/A 08/10/2013   Procedure: LEFT HEART CATHETERIZATION WITH CORONARY ANGIOGRAM;  Surgeon: MBlane Ohara MD;  [facility];  Service: Cardiovascular;  Laterality: N/A;   Left leg embolectomy  May 2012   Dr. BTrula Slade- reason for lifelong coumadin   LYMPH NODE BIOPSY Left 11/21/2019   Procedure: Left Neck LYMPH NODE BIOPSY;  Surgeon: NRozetta Nunnery MD;  [place] [facility]  Service: ENT;  Laterality: Left;   PERIPHERAL VASCULAR ATHERECTOMY  10/31/2018   Procedure: PERIPHERAL VASCULAR ATHERECTOMY;  Surgeon: BLorretta Harp MD;  [facility];  Service: Cardiovascular;;  left  SFA   POLYPECTOMY  02/02/2020   Procedure: POLYPECTOMY;  Surgeon: RRogene Houston MD;  [facility];  Service: Endoscopy;;  splenic flexure     Home Medications:  Prior to Admission medications   Medication Sig Start Date End Date Taking? Authorizing Provider  acetaminophen (TYLENOL) 325 MG tablet Take 2 tablets (650 mg total) by mouth every 6 (six) hours as needed for mild pain or moderate pain. 04/25/20   ERoxan Hockey MD  Apixaban Starter Pack, 151mand 68m80m(ELIQUIS DVT/PE STARTER PACK) Take as directed on package: start with two-68mg51mblets twice daily for 7 days. On day 8, switch to one-68mg 268mlet twice daily. 04/25/20   EmokpRoxan Hockey aspirin EC 81 MG tablet Take 1 tablet (81 mg total) by mouth daily. 11/01/17   SimpsFayrene Helper atorvastatin (LIPITOR) 40 MG tablet Take 1 tablet (40 mg total) by mouth daily. 03/21/20   MillsPerlie Mayo busPIRone (BUSPAR) 10 MG tablet Take 1 tablet (10 mg total) by mouth 2 (two) times daily. 12/20/19   MillsPerlie Mayo cholecalciferol (  VITAMIN D) 1000 units tablet Take 1 tablet (1,000 Units total) by mouth daily. 11/01/17   [name], MD  clonazePAM (KLONOPIN) 0.5 MG tablet Take 1 tablet (0.5 mg total) by mouth at bedtime. 04/25/20   Roxan Hockey, MD  escitalopram (LEXAPRO) 10 MG tablet Take 1 tablet (10 mg total) by mouth daily. 12/20/19   Perlie Mayo, NP  fluticasone (FLONASE) 50 MCG/ACT nasal spray Use 2 spray(s) in each nostril once daily Patient taking differently: Place 2 sprays into both nostrils daily.  03/22/20   Perlie Mayo, NP  furosemide (LASIX) 20 MG tablet Take 1 tablet (20 mg total) by mouth daily. 04/25/20   Roxan Hockey, MD  lisinopril (ZESTRIL) 5 MG tablet Take 1 tablet (5 mg total) by mouth daily. 04/25/20 06/24/20  Roxan Hockey, MD  magnesium oxide (MAG-OX) 400 (241.3 Mg) MG tablet Take 1 tablet (400 mg total) by mouth 2 (two) times daily. 04/04/20   Derek Jack, MD  metoprolol  succinate (TOPROL-XL) 50 MG 24 hr tablet TAKE 1 TABLET BY MOUTH ONCE DAILY WITH&nbsp;&nbsp;OR&nbsp;&nbsp;IMMEDIATELY&nbsp;&nbsp;FOLLOWING&nbsp;&nbsp;A&nbsp;&nbsp;MEAL Patient taking differently: Take 50 mg by mouth daily. WITH  OR  IMMEDIATELY  FOLLOWING  A  MEAL 03/22/20   Perlie Mayo, NP  Misc. Devices (ADJUSTABLE COMMODE 3-IN-1) MISC Dx R26.81 05/16/20   [name], MD  montelukast (SINGULAIR) 10 MG tablet Take 1 tablet (10 mg total) by mouth at bedtime. 03/22/20   Perlie Mayo, NP  nitroGLYCERIN (NITROSTAT) 0.4 MG SL tablet Place 1 tablet (0.4 mg total) under the tongue every 5 (five) minutes as needed for chest pain. 12/20/19   Lorretta Harp, MD  oxyCODONE (OXY IR/ROXICODONE) 5 MG immediate release tablet Take 1 tablet (5 mg total) by mouth every 4 (four) hours as needed for moderate pain or severe pain. 04/25/20   Roxan Hockey, MD  pantoprazole (PROTONIX) 40 MG tablet Take 1 tablet (40 mg total) by mouth daily before breakfast. 02/03/20   Amin, Jeanella Flattery, MD  senna-docusate (SENOKOT-S) 8.6-50 MG tablet Take 2 tablets by mouth at bedtime. 04/25/20 04/25/21  Roxan Hockey, MD  spironolactone (ALDACTONE) 25 MG tablet Take 0.5 tablets (12.5 mg total) by mouth daily. 04/26/20   Roxan Hockey, MD  trolamine salicylate (ASPERCREME) 10 % cream Apply 1 application topically daily as needed for muscle pain (for pain in feet). For pain in legs (swelling)    [provider]  prochlorperazine (COMPAZINE) 10 MG tablet Take 1 tablet (10 mg total) by mouth every 6 (six) hours as needed (Nausea or vomiting). 03/28/20 05/09/20  Derek Jack, MD    Inpatient Medications: Scheduled Meds:  apixaban  5 mg Oral BID   furosemide  80 mg Intravenous Q12H   lisinopril  5 mg Oral Daily   metoprolol succinate  50 mg Oral Daily   spironolactone  12.5 mg Oral Daily   Continuous Infusions:  PRN Meds: acetaminophen, ondansetron (ZOFRAN) IV, sodium chloride flush  Allergies:   No  Known Allergies  Social History:   Social History   Socioeconomic History   Marital status: Single    Spouse name: Not on file   Number of children: 2   Years of education: Not on file   Highest education level: 11th grade  Occupational History   Occupation: disability  Tobacco Use   Smoking status: Former Smoker    Packs/day: 0.50    Years: 3.00    Pack years: 1.50    Types: Cigarettes    Start date: 10/12/1965  Quit date: 02/19/1970    Years since quitting: 50.2   Smokeless tobacco: Never Used  Vaping Use   Vaping Use: Never used  Substance and Sexual Activity   Alcohol use: Not Currently   Drug use: Yes    Types: Marijuana    Comment: pt smokes weed once in the morning and once in the evening   Sexual activity: Not Currently  Other Topics Concern   Not on file  Social History Narrative   Not on file   Social Determinants of Health   Financial Resource Strain: Low Risk    Difficulty of Paying Living Expenses: Not very hard  Food Insecurity: No Food Insecurity   Worried About Running Out of Food in the Last Year: Never true   Ran Out of Food in the Last Year: Never true  Transportation Needs:    Lack of Transportation (Medical): Not on file   Lack of Transportation (Non-Medical): Not on file  Physical Activity: Inactive   Days of Exercise per Week: 0 days   Minutes of Exercise per Session: 0 min  Stress:    Feeling of Stress : Not on file  Social Connections: Moderately Isolated   Frequency of Communication with Friends and Family: Twice a week   Frequency of Social Gatherings with Friends and Family: Twice a week   Attends Religious Services: Never   Marine scientist or Organizations: Yes   Attends Archivist Meetings: Never   Marital Status: Divorced  Human resources officer Violence:    Fear of Current or Ex-Partner: Not on file   Emotionally Abused: Not on file   Physically Abused: Not on file   Sexually Abused:  Not on file     Family History:    Family History  Problem Relation Age of Onset   Diabetes Mother    Cirrhosis Father    COPD Sister       Review of Systems    General:  No chills, fever, night sweats or weight changes.  Cardiovascular:  No chest pain, palpitations, paroxysmal nocturnal dyspnea. Positive for dyspnea on exertion, orthopnea and edema.  Dermatological: No rash, lesions/masses Respiratory: No cough, Positive for dyspnea. Urologic: No hematuria, dysuria Abdominal:   No nausea, vomiting, diarrhea, bright red blood per rectum, melena, or hematemesis Neurologic:  No visual changes, wkns, changes in mental status. All other systems reviewed and are otherwise negative except as noted above.  Physical Exam/Data    Vitals:   05/16/20 2219 05/16/20 2255 05/17/20 0247 05/17/20 0534  BP: 135/75  131/74 138/80  Pulse: 98  97 (!) 101  Resp: '16  17 17  ' Temp: 97.7 F (36.5 C)   98.2 F (36.8 C)  TempSrc: Oral   Oral  SpO2: 91% 91% 96% 93%  Weight:    63.5 kg  Height:        Intake/Output Summary (Last 24 hours) at 05/17/2020 1058 Last data filed at 05/16/2020 2300 Gross per 24 hour  Intake --  Output 600 ml  Net -600 ml   Filed Weights   05/16/20 1212 05/17/20 0534  Weight: 68 kg 63.5 kg   Body mass index is 24.03 kg/m.   General: Ill-appearing male. Currently in NAD Psych: Normal affect. Neuro: Alert and oriented X 3. Moves all extremities spontaneously. HEENT: Normal  Neck: Supple without bruits or JVD. Lungs:  Resp regular and unlabored, rales along bases bilaterally. Heart: RRR no s3, s4, 2/6 SEM along RUSB.  Abdomen: Soft, non-tender, non-distended, BS +  x 4.  Extremities: No clubbing or cyanosis. 2+ pitting edema bilaterally. DP/PT/Radials 2+ and equal bilaterally.   EKG:  The EKG was personally reviewed and demonstrates:  NSR, HR 89 with LVH and ST depression along the lateral leads which is similar to prior tracings.   Telemetry:  Telemetry  was personally reviewed and demonstrates: NSR, HR in 90's to low-100's with no significant arrhythmias.    Labs/Studies     Relevant CV Studies:  Echocardiogram: 04/2020 IMPRESSIONS    1. Left ventricular ejection fraction, by estimation, is 25 to 30%. The  left ventricle has severely decreased function. The left ventricle  demonstrates global hypokinesis. The left ventricular internal cavity size  was moderately dilated. There is mild  left ventricular hypertrophy. Left ventricular diastolic parameters are  indeterminate.  2. Right ventricular systolic function is severely reduced. The right  ventricular size is mildly enlarged. There is moderately elevated  pulmonary artery systolic pressure. The estimated right ventricular  systolic pressure is 93.9 mmHg.  3. Left atrial size was severely dilated.  4. Right atrial size was severely dilated.  5. The mitral valve is abnormal. Mild to moderate mitral valve  regurgitation.  6. The aortic valve has been repaired/replaced. Aortic valve  regurgitation is trivial. There is a 26 mm Edwards Sapien prosthetic  (TAVR) valve present in the aortic position (patient has valve in valve  TAVR with previous bioprosthetic AVR). Aortic valve  mean gradient measures 8.0 mmHg. There is a moderate paravalvular leak at  9 o'clock position.  7. The inferior vena cava is dilated in size with <50% respiratory  variability, suggesting right atrial pressure of 15 mmHg.    Laboratory Data:  Chemistry Recent Labs  Lab 05/16/20 1324 05/17/20 0225  NA 126* 126*  K 4.7 4.9  CL 90* 91*  CO2 24 20*  GLUCOSE 105* 100*  BUN 29* 33*  CREATININE 0.82 0.90  CALCIUM 8.4* 8.3*  GFRNONAA >60 >60  GFRAA >60 >60  ANIONGAP 12 15    Recent Labs  Lab 05/16/20 2212  PROT 6.9  ALBUMIN 3.2*  AST 45*  ALT 27  ALKPHOS 704*  BILITOT 1.5*   Hematology Recent Labs  Lab 05/16/20 1324  WBC 10.4  RBC 4.33  HGB 11.5*  HCT 37.3*  MCV 86.1  MCH  26.6  MCHC 30.8  RDW 25.3*  PLT 192   Cardiac EnzymesNo results for input(s): TROPONINI in the last 168 hours. No results for input(s): TROPIPOC in the last 168 hours.  BNP Recent Labs  Lab 05/16/20 1324  BNP >4,500.0*    DDimer No results for input(s): DDIMER in the last 168 hours.  Radiology/Studies:  DG Chest Port 1 View  Result Date: 05/16/2020 CLINICAL DATA:  Shortness of breath. EXAM: PORTABLE CHEST 1 VIEW COMPARISON:  April 21, 2020. FINDINGS: Stable cardiomegaly. No pneumothorax is noted. Status post aortic valve repair. Right internal jugular Port-A-Cath is unchanged in position. Left lung is clear. Mild right pleural effusion is noted with associated right basilar atelectasis or infiltrate. Bony thorax is unremarkable. IMPRESSION: Mild right pleural effusion is noted with associated right basilar atelectasis or infiltrate. Electronically Signed   By: Marijo Conception M.D.   On: 05/16/2020 13:22     Assessment & Plan    1. Acute on Chronic Combined Systolic and Diastolic CHF - He presented with worsening dyspnea on exertion, orthopnea, PND and lower extremity edema for the past week and was found to have an acute CHF exacerbation with  BNP greater than 4500. CXR shows a mild right pleural effusion with associated atelectasis or infiltrate. Echo earlier this month showed his EF had further declined to 25 to 30%. - He has been started on IV Lasix 40 mg twice daily and agree with titration of this to 80 mg twice daily given his volume overload by examination. Continue to follow I's and O's along with daily weights. He does request supplemental oxygen and this has been ordered. - In regards to his cardiomyopathy, continue Spironolactone 12.5 mg daily, Toprol-XL 50 mg daily and Lisinopril 5 mg daily. Could consider switching Lisinopril to Entresto pending BP response. He would require a 36-hour washout. Would not anticipate aggressive treatment at this time given his multiple  comorbidities and would focus on medication adjustments.  2. CAD - He is s/p BMS to RCA in 2014.  He does report dyspnea on exertion but denies any recent chest pain. He is no longer on ASA given the need for anticoagulation. Does remain on beta-blocker therapy. He was previously on Lipitor but this was discontinued during prior admissions given elevated LFTs (AST 45, ALT 28, and Alk Phos 704).   3. AVR - He is s/p bioprosthetic AVR and ultimately valve in valve TAVR in 09/2017 due to torn coronary cusp of his previously placed bioprosthetic AVR. - Recent echocardiogram earlier this month showed moderate perivalvular leak. Will continue to follow but he would not be a candidate for aggressive treatment.   4. PAD - He is s/p left SFA intervention in 10/2018.  He does report pain along his lower extremities which could be due to progressive PAD but also secondary to DVT and due to his worsening edema.   5. Bilateral PE - He remains on Eliquis for anticoagulation. Hemoglobin is stable at 11.5 with platelets at 192.  6. Metastatic urothelial cancer  - He is being followed by Oncology as an outpatient and his last chemotherapy session by review of notes was on 05/09/2020.    For questions or updates, please contact [place] Please consult [email or web address] for contact info under Cardiology/STEMI.  Signed, Erma Heritage, PA-C 05/17/2020, 10:58 AM Pager: [phone]   Attending note:  Patient seen and examined.  Case discussed with Ms. Ahmed Prima PA-C, I agree with her above findings.  Mr. Hickle is known to me, medically complex and with general deterioration in the last few months per review of the history.  He was recently diagnosed with bilateral pulmonary emboli in the setting of metastatic urothelial cancer.  He has been hospitalized several times since May, now presenting with worsening shortness of breath and hypoxia.  COVID-19 testing is negative.  He has evidence of acute on  chronic systolic heart failure, recent echocardiogram demonstrating LVEF 25 to 30% range representing a decrease from prior evaluation.  He appears short of breath at rest, reports no chest pain.  He is in sinus rhythm with heart rate in the 70V, systolic blood pressure in the 10/21/1938 range.  Lungs exhibit decreased breath sounds, particularly right side at the base, no wheezing.  Cardiac exam reveals RRR with 2/6 to 3/6 diastolic murmur.  Peripheral edema present.  Pertinent lab work includes sodium 126, creatinine 0.90, BNP greater than 4500, high-sensitivity troponin I levels in the 30-60 range, hemoglobin 11.5.  ECG shows sinus rhythm with increased voltage and refuse repolarization abnormalities.  Chest x-ray reports small right-sided pleural effusion with associated atelectasis versus infiltrate.  Acute on chronic systolic heart failure in the setting of other complex  comorbid illnesses including recent diagnosis of pulmonary emboli and also metastatic urothelial cancer.  Continue IV Lasix currently increased to 80 mg twice daily.  Remaining cardiac regimen includes Toprol-XL, lisinopril, and Aldactone.  May ultimately consider transitioning from lisinopril to [facility], but would focus on fluid control first.  Recent echocardiogram shows a moderate paravalvular leak involving valve in valve TAVR, this is to be managed conservatively as he would not be a good candidate for reintervention.  He is on Eliquis with recent diagnosis of bilateral pulmonary emboli.  DNR status at this time.  Overall his prognosis is poor.  Satira Sark, M.D., F.A.C.C.

## 2020-05-17 NOTE — Progress Notes (Signed)
Patient ID: [name], male   DOB: 1951-04-15, 69 y.o.   MRN: [id number]  PROGRESS NOTE    [name]  EXN:[id number] DOB: Jan 31, 1951 DOA: 05/16/2020 PCP: [name], MD   Brief Narrative:  69 y.o. male with medical history significant for systolic and diastolic CHF, metastatic urothelial cancer status post bilateral nephrostomy tubes, aortic valve replacement, hypertension, peripheral artery disease, pulmonary embolism, recurrent recent hospitalizations for CHF exacerbation/PE/UTI/hyponatremia/acute on chronic anemia presented with difficulty breathing and leg swelling.  On presentation, BNP was more than 4500, chest x-ray showed mild right pleural effusion, right basilar atelectasis or infiltrate and sodium was 126.  He was started on IV Lasix.  Cardiology was consulted  Assessment & Plan:   Acute on chronic combined systolic and diastolic heart failure -Patient is massively volume overloaded with BNP more than 4500 -Recent echo on 04/23/2020 had shown EF of 20 to 25% -Increase Lasix to 80 mg IV every 12 hours.  Strict input and output.  Daily weights.  Fluid restriction -Overall prognosis is very poor due to recent recurrent hospitalizations.  Palliative care consultation for goals of care discussion.  Patient is DNR.  Continue lisinopril, metoprolol and spironolactone  Hyponatremia: Chronic -Patient baseline low to mid 120s.  Sodium 126 today.  Due to marked volume overload will continue Lasix as above  CAD with history of stenting -Currently chest pain-free.  Continue metoprolol  History of AVR -Recent echo showed moderate paravalvular leak involving valve and valve TAVR: This is being managed conservatively as he is not a good candidate for intervention.  Bilateral PE -Continue Eliquis  Metastatic urothelial carcinoma status post bilateral nephrostomy tubes for hydronephrosis -Outpatient follow-up with oncology/Dr. Delton Coombes: Last chemotherapy on  05/09/2020  Hypertension: -Blood pressure stable.  Continue Lasix, lisinopril, metoprolol and spironolactone  Anemia of chronic disease -Hemoglobin stable  DVT prophylaxis: Eliquis Code Status: DNR Family Communication: None at bedside Disposition Plan: Status is: Inpatient  Remains inpatient appropriate because:Inpatient level of care appropriate due to severity of illness   Dispo: The patient is from: Home              Anticipated d/c is to: Home              Anticipated d/c date is: > 3 days              Patient currently is not medically stable to d/c.  Grossly volume overloaded requiring intravenous Lasix  Consultants: Cardiology  Procedures: None  Antimicrobials: None   Subjective: Patient seen and examined at bedside.  He complains of nausea and vomiting and states that Zofran is not working.  Feels very weak.  Still feels very swollen.  Objective: Vitals:   05/16/20 2219 05/16/20 2255 05/17/20 0247 05/17/20 0534  BP: 135/75  131/74 138/80  Pulse: 98  97 (!) 101  Resp: 16  17 17   Temp: 97.7 F (36.5 C)   98.2 F (36.8 C)  TempSrc: Oral   Oral  SpO2: 91% 91% 96% 93%  Weight:    63.5 kg  Height:        Intake/Output Summary (Last 24 hours) at 05/17/2020 1256 Last data filed at 05/17/2020 0900 Gross per 24 hour  Intake --  Output 1000 ml  Net -1000 ml   Filed Weights   05/16/20 1212 05/17/20 0534  Weight: 68 kg 63.5 kg    Examination:  General exam: Looks chronically ill.  No acute distress currently.  Looks older than stated age  respiratory system: Bilateral decreased breath sounds at bases with basilar crackles Cardiovascular system: S1 & S2 heard, Rate controlled Gastrointestinal system: Abdomen is nondistended, soft and nontender. Normal bowel sounds heard. Extremities: No cyanosis, clubbing; 3+ pitting edema present Genitourinary: Bilateral nephrostomy tubes present Central nervous system: Alert and oriented. No focal neurological deficits.  Moving extremities Skin: No rashes, lesions or ulcers Psychiatry: Looks anxious.     Data Reviewed: I have personally reviewed following labs and imaging studies  CBC: Recent Labs  Lab 05/16/20 1324  WBC 10.4  NEUTROABS 7.6  HGB 11.5*  HCT 37.3*  MCV 86.1  PLT 423   Basic Metabolic Panel: Recent Labs  Lab 05/16/20 1324 05/17/20 0225  NA 126* 126*  K 4.7 4.9  CL 90* 91*  CO2 24 20*  GLUCOSE 105* 100*  BUN 29* 33*  CREATININE 0.82 0.90  CALCIUM 8.4* 8.3*   GFR: Estimated Creatinine Clearance: 65.8 mL/min (by C-G formula based on SCr of 0.9 mg/dL). Liver Function Tests: Recent Labs  Lab 05/16/20 2212  AST 45*  ALT 27  ALKPHOS 704*  BILITOT 1.5*  PROT 6.9  ALBUMIN 3.2*   No results for input(s): LIPASE, AMYLASE in the last 168 hours. No results for input(s): AMMONIA in the last 168 hours. Coagulation Profile: No results for input(s): INR, PROTIME in the last 168 hours. Cardiac Enzymes: No results for input(s): CKTOTAL, CKMB, CKMBINDEX, TROPONINI in the last 168 hours. BNP (last 3 results) No results for input(s): PROBNP in the last 8760 hours. HbA1C: No results for input(s): HGBA1C in the last 72 hours. CBG: No results for input(s): GLUCAP in the last 168 hours. Lipid Profile: No results for input(s): CHOL, HDL, LDLCALC, TRIG, CHOLHDL, LDLDIRECT in the last 72 hours. Thyroid Function Tests: No results for input(s): TSH, T4TOTAL, FREET4, T3FREE, THYROIDAB in the last 72 hours. Anemia Panel: No results for input(s): VITAMINB12, FOLATE, FERRITIN, TIBC, IRON, RETICCTPCT in the last 72 hours. Sepsis Labs: No results for input(s): PROCALCITON, LATICACIDVEN in the last 168 hours.  Recent Results (from the past 240 hour(s))  SARS Coronavirus 2 by RT PCR (hospital order, performed in [facility] hospital lab) Nasopharyngeal Nasopharyngeal Swab     Status: None   Collection Time: 05/16/20  9:42 PM   Specimen: Nasopharyngeal Swab  Result Value Ref Range Status    SARS Coronavirus 2 NEGATIVE NEGATIVE Final    Comment: (NOTE) SARS-CoV-2 target nucleic acids are NOT DETECTED.  The SARS-CoV-2 RNA is generally detectable in upper and lower respiratory specimens during the acute phase of infection. The lowest concentration of SARS-CoV-2 viral copies this assay can detect is 250 copies / mL. A negative result does not preclude SARS-CoV-2 infection and should not be used as the sole basis for treatment or other patient management decisions.  A negative result may occur with improper specimen collection / handling, submission of specimen other than nasopharyngeal swab, presence of viral mutation(s) within the areas targeted by this assay, and inadequate number of viral copies (<250 copies / mL). A negative result must be combined with clinical observations, patient history, and epidemiological information.  Fact Sheet for Patients:   StrictlyIdeas.no  Fact Sheet for Healthcare Providers: BankingDealers.co.za  This test is not yet approved or  cleared by the [place] FDA and has been authorized for detection and/or diagnosis of SARS-CoV-2 by FDA under an Emergency Use Authorization (EUA).  This EUA will remain in effect (meaning this test can be used) for the duration of the COVID-19 declaration  under Section 564(b)(1) of the Act, 21 U.S.C. section 360bbb-3(b)(1), unless the authorization is terminated or revoked sooner.  Performed at [facility], [address] [place], [place] [address]          Radiology Studies: [facility] 1 View  Result Date: 05/16/2020 CLINICAL DATA:  Shortness of breath. EXAM: PORTABLE CHEST 1 VIEW COMPARISON:  April 21, 2020. FINDINGS: Stable cardiomegaly. No pneumothorax is noted. Status post aortic valve repair. Right internal jugular Port-A-Cath is unchanged in position. Left lung is clear. Mild right pleural effusion is noted with associated right basilar  atelectasis or infiltrate. Bony thorax is unremarkable. IMPRESSION: Mild right pleural effusion is noted with associated right basilar atelectasis or infiltrate. Electronically Signed   By: Marijo Conception M.D.   On: 05/16/2020 13:22        Scheduled Meds: . apixaban  5 mg Oral BID  . furosemide  80 mg Intravenous Q12H  . lisinopril  5 mg Oral Daily  . metoprolol succinate  50 mg Oral Daily  . spironolactone  12.5 mg Oral Daily   Continuous Infusions:        Aline August, MD [facility] 05/17/2020, 12:56 PM

## 2020-05-17 NOTE — TOC Initial Note (Signed)
Transition of Care [facility]) - Initial/Assessment Note    Patient Details  Name: [name] MRN: [id number] Date of Birth: 03-06-1951  Transition of Care [facility]) CM/SW Contact:    Salome Arnt, [place] Phone Number: 05/17/2020, 9:25 AM  Clinical Narrative:   Pt admitted due to acute on chronic CHF. Pt states he lives alone and has support from friends and his brother and sister. They assist with things around the house as needed and provide some meals. Pt discharged from [facility] last Friday. He was set up with [facility] home health PT and RN. LCSW notified Santiago Glad with [facility] of admission. When medically stable, pt plans to return home with home health. Pt will need home health resumption orders at d/c.   [facility] consulted for CHF screening. Pt reports he does not weigh himself daily. LCSW explained importance of daily weights and pt reports understanding. He indicates he follows a heart healthy diet and takes his medications as prescribed. Pt is followed by Dr. Domenic Polite.                 Expected Discharge Plan: [place] Barriers to Discharge: Continued Medical Work up   Patient Goals and CMS Choice Patient states their goals for this hospitalization and ongoing recovery are:: Return home      Expected Discharge Plan and Services Expected Discharge Plan: [place] In-house Referral: Clinical Social Work   Post Acute Care Choice: [place] arrangements for the past 2 months: [place], [place] Arranged: RN, PT [facility] Agency: [place] Date [place]: 05/17/20 Time [place]: [phone] Representative spoke with at [place]: Ashley Murrain  Prior Living Arrangements/Services Living arrangements for the past 2 months: [place], [place] Lives with:: Self Patient language and need for interpreter reviewed::  Yes Do you feel safe going back to the place where you live?: Yes      Need for Family Participation in Patient Care: Yes (Comment) Care giver support system in place?: Yes (comment) Current home services: DME, Home PT, Home RN (cane, walker) Criminal Activity/Legal Involvement Pertinent to Current Situation/Hospitalization: No - Comment as needed  Activities of Daily Living Home Assistive Devices/Equipment: Cane (specify quad or straight), Walker (specify type) ADL Screening (condition at time of admission) Patient's cognitive ability adequate to safely complete daily activities?: Yes Is the patient deaf or have difficulty hearing?: No Does the patient have difficulty seeing, even when wearing glasses/contacts?: No Does the patient have difficulty concentrating, remembering, or making decisions?: No Patient able to express need for assistance with ADLs?: No Does the patient have difficulty dressing or bathing?: Yes Independently performs ADLs?: No Communication: Independent Dressing (OT): Needs assistance Is this a change from baseline?: Pre-admission baseline Grooming: Needs assistance Is this a change from baseline?: Pre-admission baseline Feeding: Independent Bathing: Needs assistance Is this a change from baseline?: Pre-admission baseline Toileting: Independent with device (comment) Is this a change from baseline?: Pre-admission baseline In/Out Bed: Needs assistance Is this a change from baseline?: Pre-admission baseline Walks in Home: Independent with device (comment) Does the patient have difficulty walking or climbing stairs?: Yes Weakness of Legs: Both Weakness of Arms/Hands: Both  Permission Sought/Granted Permission sought to share information with : Other (comment) Permission granted to share information  with : Yes, Verbal Permission Granted     Permission granted to share info w AGENCY: [facility]  Permission granted to share info w Relationship: Home Health      Emotional Assessment Appearance:: Appears stated age Attitude/Demeanor/Rapport: Engaged Affect (typically observed): Accepting Orientation: : Oriented to Self, Oriented to Place, Oriented to  Time, Oriented to Situation Alcohol / Substance Use: Not Applicable Psych Involvement: No (comment)  Admission diagnosis:  SOB (shortness of breath) [R06.02] Acute on chronic systolic congestive heart failure (HCC) [I50.23] Acute on chronic combined systolic and diastolic CHF (congestive heart failure) ([place]) [I50.43] Patient Active Problem List   Diagnosis Date Noted  . Acute on chronic combined systolic and diastolic CHF (congestive heart failure) ([place]) 05/16/2020  . DNR (do not resuscitate)   . Palliative care by specialist   . Pulmonary embolism ([place]) 04/22/2020  . Acute on chronic diastolic (congestive) heart failure ([place]) 04/21/2020  . Hyponatremia   . Transaminitis   . CHF exacerbation ([place]) 04/12/2020  . Hydronephrosis 04/01/2020  . Malignant neoplasm of overlapping sites of bladder ([place]) 03/29/2020  . Iron deficiency anemia 03/27/2020  . Nausea with vomiting 03/27/2020  . Encounter for support and coordination of transition of care 03/19/2020  . UTI (urinary tract infection) 03/06/2020  . Port-A-Cath in place 02/23/2020  . Hydrocele 02/21/2020  . Metastatic urothelial carcinoma ([place]) 02/13/2020  . Goals of care, counseling/discussion 02/13/2020  . Chronic anemia 01/31/2020  . Acute on chronic HFrEF (heart failure with reduced ejection fraction) -combined systolic and diastolic dysfunction CHF 58/85/0277  . Nervously anxious 12/20/2019  . Depression, major, single episode, mild ([place]) 12/20/2019  . Weight loss, non-intentional 10/17/2019  . Annual visit for general adult medical examination with abnormal findings 09/12/2019  . Supraclavicular lymphadenopathy 09/12/2019  . Grade 3 out of 6 intensity murmur 09/12/2019  . Bilateral recurrent inguinal hernias 03/08/2019  .  Claudication in peripheral vascular disease ([place]) 10/31/2018  . Peripheral arterial disease ([place]) 10/26/2018  . Shortness of breath on exertion 10/16/2017  . Severe aortic insufficiency 10/02/2017  . Allergic rhinitis 12/30/2015  . Hyperlipidemia LDL goal <70 08/25/2013  . Aortic atherosclerosis ([place]) 08/12/2013  . Coronary atherosclerosis of native coronary artery 08/10/2013  . Hepatic artery aneurysm ([place]) 12/10/2010  . S/P aortic valve replacement   . Essential hypertension, benign 05/13/2010   PCP:  [name], MD Pharmacy:   [facility] [address], [place]  [address] [place] [address] Phone: [phone] Fax: [phone]     Social Determinants of Health (SDOH) Interventions    Readmission Risk Interventions Readmission Risk Prevention Plan 05/17/2020 04/22/2020 03/07/2020  Transportation Screening Complete Complete Complete  PCP or Specialist Appt within 3-5 Days - - Not Complete  Not Complete comments - - Assessment completed during second shift  [place] or [place] - - Complete  Social Work Consult for [place] Planning/Counseling - - Complete  Palliative Care Screening - - Not Applicable  Medication Review Press photographer) Complete Complete Complete  PCP or Specialist appointment within 3-5 days of discharge - Complete -  [place] or Home Care Consult Complete Complete -  SW Recovery Care/Counseling Consult Complete Complete -  Palliative Care Screening Not Applicable Complete -  [place] Not Applicable Not Applicable -  Some recent data might be hidden

## 2020-05-17 NOTE — Plan of Care (Signed)

## 2020-05-17 NOTE — Care Management Important Message (Signed)
Important Message  Patient Details  Name: [name] MRN: [id number] Date of Birth: 1951/02/01   Medicare Important Message Given:  Yes     [name] 05/17/2020, 4:09 PM

## 2020-05-18 LAB — COMPREHENSIVE METABOLIC PANEL
ALT: 31 U/L (ref 0–44)
AST: 42 U/L — ABNORMAL HIGH (ref 15–41)
Albumin: 2.9 g/dL — ABNORMAL LOW (ref 3.5–5.0)
Alkaline Phosphatase: 660 U/L — ABNORMAL HIGH (ref 38–126)
Anion gap: 11 (ref 5–15)
BUN: 35 mg/dL — ABNORMAL HIGH (ref 8–23)
CO2: 26 mmol/L (ref 22–32)
Calcium: 8.2 mg/dL — ABNORMAL LOW (ref 8.9–10.3)
Chloride: 89 mmol/L — ABNORMAL LOW (ref 98–111)
Creatinine, Ser: 1.03 mg/dL (ref 0.61–1.24)
GFR calc Af Amer: >60 mL/min (ref >60)
GFR calc non Af Amer: >60 mL/min (ref >60)
Glucose, Bld: 106 mg/dL — ABNORMAL HIGH (ref 70–99)
Potassium: 4.4 mmol/L (ref 3.5–5.1)
Sodium: 126 mmol/L — ABNORMAL LOW (ref 135–145)
Total Bilirubin: 1.2 mg/dL (ref 0.3–1.2)
Total Protein: 6 g/dL — ABNORMAL LOW (ref 6.5–8.1)

## 2020-05-18 LAB — CBC WITH DIFFERENTIAL/PLATELET
Abs Immature Granulocytes: 0.17 10*3/uL — ABNORMAL HIGH (ref 0.00–0.07)
Basophils Absolute: 0.1 10*3/uL (ref 0.0–0.1)
Basophils Relative: 1 %
Eosinophils Absolute: 0.1 10*3/uL (ref 0.0–0.5)
Eosinophils Relative: 1 %
HCT: 35.7 % — ABNORMAL LOW (ref 39.0–52.0)
Hemoglobin: 11.4 g/dL — ABNORMAL LOW (ref 13.0–17.0)
Immature Granulocytes: 2 %
Lymphocytes Relative: 10 %
Lymphs Abs: 1.1 10*3/uL (ref 0.7–4.0)
MCH: 27.3 pg (ref 26.0–34.0)
MCHC: 31.9 g/dL (ref 30.0–36.0)
MCV: 85.4 fL (ref 80.0–100.0)
Monocytes Absolute: 1 10*3/uL (ref 0.1–1.0)
Monocytes Relative: 10 %
Neutro Abs: 8.2 10*3/uL — ABNORMAL HIGH (ref 1.7–7.7)
Neutrophils Relative %: 76 %
Platelets: 185 10*3/uL (ref 150–400)
RBC: 4.18 MIL/uL — ABNORMAL LOW (ref 4.22–5.81)
RDW: 25.1 % — ABNORMAL HIGH (ref 11.5–15.5)
WBC: 10.6 10*3/uL — ABNORMAL HIGH (ref 4.0–10.5)
nRBC: 0.5 % — ABNORMAL HIGH (ref 0.0–0.2)

## 2020-05-18 LAB — MAGNESIUM: Magnesium: 2 mg/dL (ref 1.7–2.4)

## 2020-05-18 NOTE — Progress Notes (Signed)
Patient ID: [name], male   DOB: 25-Jan-1951, 69 y.o.   MRN: [id number]  PROGRESS NOTE    [name]  UEK:[id number] DOB: 10-06-1950 DOA: 05/16/2020 PCP: [name], MD   Brief Narrative:  69 y.o. male with medical history significant for systolic and diastolic CHF, metastatic urothelial cancer status post bilateral nephrostomy tubes, aortic valve replacement, hypertension, peripheral artery disease, pulmonary embolism, recurrent recent hospitalizations for CHF exacerbation/PE/UTI/hyponatremia/acute on chronic anemia presented with difficulty breathing and leg swelling.  On presentation, BNP was more than 4500, chest x-ray showed mild right pleural effusion, right basilar atelectasis or infiltrate and sodium was 126.  He was started on IV Lasix.  Cardiology was consulted  Assessment & Plan:   Acute on chronic combined systolic and diastolic heart failure -Patient is massively volume overloaded with BNP more than 4500 -Recent echo on 04/23/2020 had shown EF of 20 to 25% -Continue IV Lasix. strict input and output.  Daily weights.  Fluid restriction -Overall prognosis is very poor due to recent recurrent hospitalizations.  Palliative care consultation for goals of care discussion.  Patient is DNR.  Continue lisinopril, metoprolol and spironolactone  Hyponatremia: Chronic -Patient baseline low to mid 120s.  Sodium 126 today.  Due to marked volume overload will continue Lasix as above  CAD with history of stenting -Currently chest pain-free.  Continue metoprolol  History of AVR -Recent echo showed moderate paravalvular leak involving valve and valve TAVR: This is being managed conservatively as he is not a good candidate for intervention.  Bilateral PE -Continue Eliquis  Metastatic urothelial carcinoma status post bilateral nephrostomy tubes for hydronephrosis -Outpatient follow-up with oncology/Dr. Delton Coombes: Last chemotherapy on 05/09/2020  Hypertension: -Blood  pressure stable.  Continue Lasix, lisinopril, metoprolol and spironolactone  Anemia of chronic disease -Hemoglobin stable  DVT prophylaxis: Eliquis Code Status: DNR Family Communication: None at bedside Disposition Plan: Status is: Inpatient  Remains inpatient appropriate because:Inpatient level of care appropriate due to severity of illness   Dispo: The patient is from: Home              Anticipated d/c is to: Home              Anticipated d/c date is: 3 days              Patient currently is not medically stable to d/c.  Grossly volume overloaded requiring intravenous Lasix  Consultants: Cardiology  Procedures: None  Antimicrobials: None   Subjective: Patient seen and examined at bedside.  He complains of leg cramps and does not feel well.  Still feels very swollen.  Denies any worsening shortness of breath.  No overnight fever or vomiting.  Very poor historian.  Objective: Vitals:   05/17/20 1937 05/17/20 1946 05/17/20 2122 05/18/20 0624  BP:   131/75 116/69  Pulse:   81 85  Resp:   19 20  Temp: (!) 93.8 F (34.3 C) (!) 97.3 F (36.3 C) 97.7 F (36.5 C) 98.2 F (36.8 C)  TempSrc: Oral Rectal Oral   SpO2:   96% 98%  Weight:      Height:        Intake/Output Summary (Last 24 hours) at 05/18/2020 0925 Last data filed at 05/18/2020 0300 Gross per 24 hour  Intake 350 ml  Output 1105 ml  Net -755 ml   Filed Weights   05/16/20 1212 05/17/20 0534  Weight: 68 kg 63.5 kg    Examination:  General exam: Looks chronically ill.  No  distress.  Looks older than stated age  respiratory system: Bilateral decreased breath sounds at bases with bibasilar crackles.  No wheezing Cardiovascular system: Rate controlled, S1-S2 heard Gastrointestinal system: Abdomen is nondistended, soft and nontender.  Bowel sounds are heard  extremities: 3+ pitting edema present.  No clubbing  genitourinary: Bilateral nephrostomy tubes present draining clear urine  Central nervous system:  Awake and alert.  No focal neurological deficits.  Moves extremities.   Skin: No obvious ecchymosis/ulcers Psychiatry: Very anxious looking    Data Reviewed: I have personally reviewed following labs and imaging studies  CBC: Recent Labs  Lab 05/16/20 1324 05/18/20 0628  WBC 10.4 10.6*  NEUTROABS 7.6 8.2*  HGB 11.5* 11.4*  HCT 37.3* 35.7*  MCV 86.1 85.4  PLT 192 440   Basic Metabolic Panel: Recent Labs  Lab 05/16/20 1324 05/17/20 0225 05/18/20 0628  NA 126* 126* 126*  K 4.7 4.9 4.4  CL 90* 91* 89*  CO2 24 20* 26  GLUCOSE 105* 100* 106*  BUN 29* 33* 35*  CREATININE 0.82 0.90 1.03  CALCIUM 8.4* 8.3* 8.2*  MG  --   --  2.0   GFR: Estimated Creatinine Clearance: 57.5 mL/min (by C-G formula based on SCr of 1.03 mg/dL). Liver Function Tests: Recent Labs  Lab 05/16/20 2212 05/18/20 0628  AST 45* 42*  ALT 27 31  ALKPHOS 704* 660*  BILITOT 1.5* 1.2  PROT 6.9 6.0*  ALBUMIN 3.2* 2.9*   No results for input(s): LIPASE, AMYLASE in the last 168 hours. No results for input(s): AMMONIA in the last 168 hours. Coagulation Profile: No results for input(s): INR, PROTIME in the last 168 hours. Cardiac Enzymes: No results for input(s): CKTOTAL, CKMB, CKMBINDEX, TROPONINI in the last 168 hours. BNP (last 3 results) No results for input(s): PROBNP in the last 8760 hours. HbA1C: No results for input(s): HGBA1C in the last 72 hours. CBG: No results for input(s): GLUCAP in the last 168 hours. Lipid Profile: No results for input(s): CHOL, HDL, LDLCALC, TRIG, CHOLHDL, LDLDIRECT in the last 72 hours. Thyroid Function Tests: No results for input(s): TSH, T4TOTAL, FREET4, T3FREE, THYROIDAB in the last 72 hours. Anemia Panel: No results for input(s): VITAMINB12, FOLATE, FERRITIN, TIBC, IRON, RETICCTPCT in the last 72 hours. Sepsis Labs: No results for input(s): PROCALCITON, LATICACIDVEN in the last 168 hours.  Recent Results (from the past 240 hour(s))  SARS Coronavirus 2 by RT  PCR (hospital order, performed in [facility] hospital lab) Nasopharyngeal Nasopharyngeal Swab     Status: None   Collection Time: 05/16/20  9:42 PM   Specimen: Nasopharyngeal Swab  Result Value Ref Range Status   SARS Coronavirus 2 NEGATIVE NEGATIVE Final    Comment: (NOTE) SARS-CoV-2 target nucleic acids are NOT DETECTED.  The SARS-CoV-2 RNA is generally detectable in upper and lower respiratory specimens during the acute phase of infection. The lowest concentration of SARS-CoV-2 viral copies this assay can detect is 250 copies / mL. A negative result does not preclude SARS-CoV-2 infection and should not be used as the sole basis for treatment or other patient management decisions.  A negative result may occur with improper specimen collection / handling, submission of specimen other than nasopharyngeal swab, presence of viral mutation(s) within the areas targeted by this assay, and inadequate number of viral copies (<250 copies / mL). A negative result must be combined with clinical observations, patient history, and epidemiological information.  Fact Sheet for Patients:   StrictlyIdeas.no  Fact Sheet for Healthcare Providers: BankingDealers.co.za  This test is not yet approved or  cleared by the [place] and has been authorized for detection and/or diagnosis of SARS-CoV-2 by FDA under an Emergency Use Authorization (EUA).  This EUA will remain in effect (meaning this test can be used) for the duration of the COVID-19 declaration under Section 564(b)(1) of the Act, 21 U.S.C. section 360bbb-3(b)(1), unless the authorization is terminated or revoked sooner.  Performed at [facility], [address], [place], [place] [address]          Radiology Studies: [facility] Chest [facility] 1 View  Result Date: 05/16/2020 CLINICAL DATA:  Shortness of breath. EXAM: PORTABLE CHEST 1 VIEW COMPARISON:  April 21, 2020. FINDINGS: Stable  cardiomegaly. No pneumothorax is noted. Status post aortic valve repair. Right internal jugular Port-A-Cath is unchanged in position. Left lung is clear. Mild right pleural effusion is noted with associated right basilar atelectasis or infiltrate. Bony thorax is unremarkable. IMPRESSION: Mild right pleural effusion is noted with associated right basilar atelectasis or infiltrate. Electronically Signed   By: Marijo Conception M.D.   On: 05/16/2020 13:22        Scheduled Meds: . apixaban  5 mg Oral BID  . furosemide  80 mg Intravenous Q12H  . lisinopril  5 mg Oral Daily  . metoprolol succinate  50 mg Oral Daily  . spironolactone  12.5 mg Oral Daily   Continuous Infusions:        Aline August, MD [facility] 05/18/2020, 9:25 AM

## 2020-05-19 DIAGNOSIS — E871 Hypo-osmolality and hyponatremia: Secondary | ICD-10-CM

## 2020-05-19 DIAGNOSIS — I2609 Other pulmonary embolism with acute cor pulmonale: Secondary | ICD-10-CM

## 2020-05-19 LAB — BASIC METABOLIC PANEL
Anion gap: 11 (ref 5–15)
BUN: 29 mg/dL — ABNORMAL HIGH (ref 8–23)
CO2: 26 mmol/L (ref 22–32)
Calcium: 7.7 mg/dL — ABNORMAL LOW (ref 8.9–10.3)
Chloride: 87 mmol/L — ABNORMAL LOW (ref 98–111)
Creatinine, Ser: 0.91 mg/dL (ref 0.61–1.24)
GFR calc Af Amer: >60 mL/min (ref >60)
GFR calc non Af Amer: >60 mL/min (ref >60)
Glucose, Bld: 98 mg/dL (ref 70–99)
Potassium: 3.8 mmol/L (ref 3.5–5.1)
Sodium: 124 mmol/L — ABNORMAL LOW (ref 135–145)

## 2020-05-19 LAB — MAGNESIUM: Magnesium: 1.8 mg/dL (ref 1.7–2.4)

## 2020-05-19 MED ORDER — CHLORHEXIDINE GLUCONATE CLOTH 2 % EX PADS
6.0000 | MEDICATED_PAD | Freq: Every day | CUTANEOUS | Status: DC
  Administered 2020-05-20 – 2020-05-29 (×10): 6 via TOPICAL

## 2020-05-19 NOTE — Progress Notes (Signed)
Patient ID: [name], male   DOB: [birth year], 69 y.o.   MRN: [id number]  PROGRESS NOTE    [name]  IZT:[id number] DOB: 04-17-1951 DOA: 05/16/2020 PCP: [name], MD   Brief Narrative:  69 y.o. male with medical history significant for systolic and diastolic CHF, metastatic urothelial cancer status post bilateral nephrostomy tubes, aortic valve replacement, hypertension, peripheral artery disease, pulmonary embolism, recurrent recent hospitalizations for CHF exacerbation/PE/UTI/hyponatremia/acute on chronic anemia presented with difficulty breathing and leg swelling.  On presentation, BNP was more than 4500, chest x-ray showed mild right pleural effusion, right basilar atelectasis or infiltrate and sodium was 126.  He was started on IV Lasix.  Cardiology was consulted  Assessment & Plan:   Acute on chronic combined systolic and diastolic heart failure -Patient is massively volume overloaded with BNP more than 4500 -Recent echo on 04/23/2020 had shown EF of 20 to 25% -Continue IV Lasix 80 mg twice a day. strict input and output.  Daily weights.  Fluid restriction.  Negative balance of 4555 cc since admission. -Overall prognosis is very poor due to recent recurrent hospitalizations.  Palliative care consultation for goals of care discussion.  Patient is DNR.  Continue lisinopril, metoprolol and spironolactone  Hyponatremia: Chronic -Patient baseline low to mid 120s.  Sodium 124 today.  Due to marked volume overload will continue Lasix as above.  Will follow up with cardiology tomorrow regarding need for tolvaptan  CAD with history of stenting -Currently chest pain-free.  Continue metoprolol  History of AVR -Recent echo showed moderate paravalvular leak involving valve and valve TAVR: This is being managed conservatively as he is not a good candidate for intervention.  Bilateral PE -Continue Eliquis  Metastatic urothelial carcinoma status post bilateral nephrostomy  tubes for hydronephrosis -Outpatient follow-up with oncology/Dr. Delton Coombes: Last chemotherapy on 05/09/2020  Hypertension: -Blood pressure stable.  Continue Lasix, lisinopril, metoprolol and spironolactone  Anemia of chronic disease -Hemoglobin stable  DVT prophylaxis: Eliquis Code Status: DNR Family Communication: None at bedside Disposition Plan: Status is: Inpatient  Remains inpatient appropriate because:Inpatient level of care appropriate due to severity of illness   Dispo: The patient is from: Home              Anticipated d/c is to: Home              Anticipated d/c date is: 1 day              Patient currently is not medically stable to d/c.  Grossly volume overloaded requiring intravenous Lasix  Consultants: Cardiology  Procedures: None  Antimicrobials: None   Subjective: Patient seen and examined at bedside.  He feels that his neck swelling is slightly better but still complains of pain and cramps in his legs fairly controlled with his current pain medications.  Thinks that he wants to go home tomorrow.  No worsening shortness of breath, chest pain or fevers reported.  Feels weak.   Objective: Vitals:   05/18/20 0624 05/18/20 2130 05/19/20 0500 05/19/20 0549  BP: 116/69 (!) 99/56  99/60  Pulse: 85 78  79  Resp: 20 19  19   Temp: 98.2 F (36.8 C) 98.1 F (36.7 C)  98.3 F (36.8 C)  TempSrc:  Oral  Oral  SpO2: 98% 95%  95%  Weight:   64.2 kg   Height:        Intake/Output Summary (Last 24 hours) at 05/19/2020 0919 Last data filed at 05/19/2020 0726 Gross per 24 hour  Intake --  Output 2800 ml  Net -2800 ml   Filed Weights   05/16/20 1212 05/17/20 0534 05/19/20 0500  Weight: 68 kg 63.5 kg 64.2 kg    Examination:  General exam: Looks chronically ill.  No acute distress.  Looks older than stated age  respiratory system: Bilateral decreased breath sounds at bases with basilar crackles cardiovascular system: S1-S2 heard, rate controlled Gastrointestinal  system: Abdomen is nondistended, soft and nontender.  Normal bowel sounds are heard  extremities: 2-3+ pitting edema; slightly improved.  No cyanosis genitourinary: Bilateral nephrostomy tubes present draining clear urine  Central nervous system: Alert and oriented.  Poor historian.  No focal neurological deficits.  Moving extremities Skin: No obvious petechiae/lesions Psychiatry: Looks anxious   Data Reviewed: I have personally reviewed following labs and imaging studies  CBC: Recent Labs  Lab 05/16/20 1324 05/18/20 0628  WBC 10.4 10.6*  NEUTROABS 7.6 8.2*  HGB 11.5* 11.4*  HCT 37.3* 35.7*  MCV 86.1 85.4  PLT 192 631   Basic Metabolic Panel: Recent Labs  Lab 05/16/20 1324 05/17/20 0225 05/18/20 0628 05/19/20 0729  NA 126* 126* 126* 124*  K 4.7 4.9 4.4 3.8  CL 90* 91* 89* 87*  CO2 24 20* 26 26  GLUCOSE 105* 100* 106* 98  BUN 29* 33* 35* 29*  CREATININE 0.82 0.90 1.03 0.91  CALCIUM 8.4* 8.3* 8.2* 7.7*  MG  --   --  2.0 1.8   GFR: Estimated Creatinine Clearance: 65.1 mL/min (by C-G formula based on SCr of 0.91 mg/dL). Liver Function Tests: Recent Labs  Lab 05/16/20 2212 05/18/20 0628  AST 45* 42*  ALT 27 31  ALKPHOS 704* 660*  BILITOT 1.5* 1.2  PROT 6.9 6.0*  ALBUMIN 3.2* 2.9*   No results for input(s): LIPASE, AMYLASE in the last 168 hours. No results for input(s): AMMONIA in the last 168 hours. Coagulation Profile: No results for input(s): INR, PROTIME in the last 168 hours. Cardiac Enzymes: No results for input(s): CKTOTAL, CKMB, CKMBINDEX, TROPONINI in the last 168 hours. BNP (last 3 results) No results for input(s): PROBNP in the last 8760 hours. HbA1C: No results for input(s): HGBA1C in the last 72 hours. CBG: No results for input(s): GLUCAP in the last 168 hours. Lipid Profile: No results for input(s): CHOL, HDL, LDLCALC, TRIG, CHOLHDL, LDLDIRECT in the last 72 hours. Thyroid Function Tests: No results for input(s): TSH, T4TOTAL, FREET4,  T3FREE, THYROIDAB in the last 72 hours. Anemia Panel: No results for input(s): VITAMINB12, FOLATE, FERRITIN, TIBC, IRON, RETICCTPCT in the last 72 hours. Sepsis Labs: No results for input(s): PROCALCITON, LATICACIDVEN in the last 168 hours.  Recent Results (from the past 240 hour(s))  SARS Coronavirus 2 by RT PCR (hospital order, performed in [facility] hospital lab) Nasopharyngeal Nasopharyngeal Swab     Status: None   Collection Time: 05/16/20  9:42 PM   Specimen: Nasopharyngeal Swab  Result Value Ref Range Status   SARS Coronavirus 2 NEGATIVE NEGATIVE Final    Comment: (NOTE) SARS-CoV-2 target nucleic acids are NOT DETECTED.  The SARS-CoV-2 RNA is generally detectable in upper and lower respiratory specimens during the acute phase of infection. The lowest concentration of SARS-CoV-2 viral copies this assay can detect is 250 copies / mL. A negative result does not preclude SARS-CoV-2 infection and should not be used as the sole basis for treatment or other patient management decisions.  A negative result may occur with improper specimen collection / handling, submission of specimen other than nasopharyngeal  swab, presence of viral mutation(s) within the areas targeted by this assay, and inadequate number of viral copies (<250 copies / mL). A negative result must be combined with clinical observations, patient history, and epidemiological information.  Fact Sheet for Patients:   StrictlyIdeas.no  Fact Sheet for Healthcare Providers: BankingDealers.co.za  This test is not yet approved or  cleared by the [place] FDA and has been authorized for detection and/or diagnosis of SARS-CoV-2 by FDA under an Emergency Use Authorization (EUA).  This EUA will remain in effect (meaning this test can be used) for the duration of the COVID-19 declaration under Section 564(b)(1) of the Act, 21 U.S.C. section 360bbb-3(b)(1), unless the  authorization is terminated or revoked sooner.  Performed at [facility], [address], [place], [place] [address]          Radiology Studies: No results found.      Scheduled Meds: . apixaban  5 mg Oral BID  . furosemide  80 mg Intravenous Q12H  . lisinopril  5 mg Oral Daily  . metoprolol succinate  50 mg Oral Daily  . spironolactone  12.5 mg Oral Daily   Continuous Infusions:        Aline August, MD [facility] 05/19/2020, 9:19 AM

## 2020-05-20 DIAGNOSIS — C799 Secondary malignant neoplasm of unspecified site: Secondary | ICD-10-CM

## 2020-05-20 DIAGNOSIS — Z515 Encounter for palliative care: Secondary | ICD-10-CM

## 2020-05-20 DIAGNOSIS — I5023 Acute on chronic systolic (congestive) heart failure: Secondary | ICD-10-CM

## 2020-05-20 DIAGNOSIS — Z7189 Other specified counseling: Secondary | ICD-10-CM

## 2020-05-20 LAB — BASIC METABOLIC PANEL
Anion gap: 9 (ref 5–15)
BUN: 26 mg/dL — ABNORMAL HIGH (ref 8–23)
CO2: 28 mmol/L (ref 22–32)
Calcium: 7.6 mg/dL — ABNORMAL LOW (ref 8.9–10.3)
Chloride: 86 mmol/L — ABNORMAL LOW (ref 98–111)
Creatinine, Ser: 0.87 mg/dL (ref 0.61–1.24)
GFR calc Af Amer: >60 mL/min (ref >60)
GFR calc non Af Amer: >60 mL/min (ref >60)
Glucose, Bld: 79 mg/dL (ref 70–99)
Potassium: 4 mmol/L (ref 3.5–5.1)
Sodium: 123 mmol/L — ABNORMAL LOW (ref 135–145)

## 2020-05-20 LAB — MAGNESIUM: Magnesium: 1.8 mg/dL (ref 1.7–2.4)

## 2020-05-20 MED ORDER — FUROSEMIDE 10 MG/ML IJ SOLN
80.0000 mg | Freq: Three times a day (TID) | INTRAMUSCULAR | Status: DC
  Administered 2020-05-20 – 2020-05-27 (×21): 80 mg via INTRAVENOUS
  Filled 2020-05-20 (×21): qty 8

## 2020-05-20 NOTE — Plan of Care (Signed)
  Problem: Acute Rehab PT Goals(only PT should resolve) Goal: Pt Will Go Supine/Side To Sit Outcome: Progressing Flowsheets (Taken 05/20/2020 1605) Pt will go Supine/Side to Sit: with supervision Goal: Pt Will Go Sit To Supine/Side Outcome: Progressing Flowsheets (Taken 05/20/2020 1605) Pt will go Sit to Supine/Side: with supervision Goal: Patient Will Transfer Sit To/From Stand Outcome: Progressing Flowsheets (Taken 05/20/2020 1605) Patient will transfer sit to/from stand: with supervision Goal: Pt Will Transfer Bed To Chair/Chair To Bed Outcome: Progressing Flowsheets (Taken 05/20/2020 1605) Pt will Transfer Bed to Chair/Chair to Bed: min guard assist Goal: Pt Will Ambulate Outcome: Progressing Flowsheets (Taken 05/20/2020 1605) Pt will Ambulate:  100 feet  with min guard assist  with least restrictive assistive device Goal: Pt Will Go Up/Down Stairs Outcome: Progressing Flowsheets (Taken 05/20/2020 1605) Pt will Go Up / Down Stairs: 3-5 stairs   Pamala Hurry D. Hartnett-Rands, MS, PT Per [place] [phone] 05/20/2020

## 2020-05-20 NOTE — Progress Notes (Signed)
Patient ID: [name], male   DOB: 10-15-1950, 69 y.o.   MRN: [id number]  PROGRESS NOTE    [name]  UUV:[id number] DOB: 1950-11-24 DOA: 05/16/2020 PCP: [name], MD   Brief Narrative:  69 y.o. male with medical history significant for systolic and diastolic CHF, metastatic urothelial cancer status post bilateral nephrostomy tubes, aortic valve replacement, hypertension, peripheral artery disease, pulmonary embolism, recurrent recent hospitalizations for CHF exacerbation/PE/UTI/hyponatremia/acute on chronic anemia presented with difficulty breathing and leg swelling.  On presentation, BNP was more than 4500, chest x-ray showed mild right pleural effusion, right basilar atelectasis or infiltrate and sodium was 126.  He was started on IV Lasix.  Cardiology was consulted  Assessment & Plan:   Acute on chronic combined systolic and diastolic heart failure -Patient is massively volume overloaded with BNP more than 4500 -Recent echo on 04/23/2020 had shown EF of 20 to 25% -Continue IV Lasix 80 mg twice a day. strict input and output.  Daily weights.  Fluid restriction.  Negative balance of 6305 cc since admission.  Still appears grossly volume overloaded -Overall prognosis is very poor due to recent recurrent hospitalizations.  Palliative care consultation for goals of care discussion.  Patient is DNR.  Continue lisinopril, metoprolol and spironolactone  Hyponatremia: Chronic -Patient baseline low to mid 120s.  Sodium 123 today.  Due to marked volume overload will continue Lasix as above.  Will follow up with cardiology regarding need for tolvaptan  CAD with history of stenting -Currently chest pain-free.  Continue metoprolol  History of AVR -Recent echo showed moderate paravalvular leak involving valve and valve TAVR: This is being managed conservatively as he is not a good candidate for intervention.  Bilateral PE -Continue Eliquis  Metastatic urothelial carcinoma  status post bilateral nephrostomy tubes for hydronephrosis -Outpatient follow-up with oncology/Dr. Delton Coombes: Last chemotherapy on 05/09/2020  Hypertension: -Blood pressure stable.  Continue Lasix, lisinopril, metoprolol and spironolactone  Anemia of chronic disease -Hemoglobin stable  Generalized deconditioning -PT eval  DVT prophylaxis: Eliquis Code Status: DNR Family Communication: None at bedside Disposition Plan: Status is: Inpatient  Remains inpatient appropriate because:Inpatient level of care appropriate due to severity of illness   Dispo: The patient is from: Home              Anticipated d/c is to: Home              Anticipated d/c date is: 2 days              Patient currently is not medically stable to d/c.  Grossly volume overloaded requiring intravenous Lasix  Consultants: Cardiology  Procedures: None  Antimicrobials: None   Subjective: Patient seen and examined at bedside.  Still feels very weak and has lots of leg swelling.  No worsening shortness of breath.  No nausea or vomiting.  No overnight fevers reported. Objective: Vitals:   05/19/20 0956 05/19/20 1702 05/19/20 2016 05/20/20 0700  BP: 115/62 99/65 (!) 96/57   Pulse: 87 74 72   Resp: 16 18 18    Temp:  98.4 F (36.9 C) 98.3 F (36.8 C)   TempSrc:  Oral Oral   SpO2: 100% 99% 100%   Weight:    63.8 kg  Height:        Intake/Output Summary (Last 24 hours) at 05/20/2020 0820 Last data filed at 05/20/2020 0600 Gross per 24 hour  Intake --  Output 1750 ml  Net -1750 ml   Filed Weights   05/17/20 0534  05/19/20 0500 05/20/20 0700  Weight: 63.5 kg 64.2 kg 63.8 kg    Examination:  General exam: Looks chronically ill.  No distress.  Looks older than stated age  respiratory system: Bilateral decreased breath sounds at bases with bibasilar crackles.  No wheezing  cardiovascular system: Rate controlled, S1-S2 heard Gastrointestinal system: Abdomen is nondistended, soft and nontender.  Bowel  sounds are heard  extremities: Still has 2-3+ pitting edema.  No clubbing genitourinary: Bilateral nephrostomy tubes present draining clear urine  Central nervous system: Awake and alert .  No focal neurological deficits.  Moves extremities  skin: No obvious ecchymosis/lesions Psychiatry: Anxious looking   Data Reviewed: I have personally reviewed following labs and imaging studies  CBC: Recent Labs  Lab 05/16/20 1324 05/18/20 0628  WBC 10.4 10.6*  NEUTROABS 7.6 8.2*  HGB 11.5* 11.4*  HCT 37.3* 35.7*  MCV 86.1 85.4  PLT 192 696   Basic Metabolic Panel: Recent Labs  Lab 05/16/20 1324 05/17/20 0225 05/18/20 0628 05/19/20 0729 05/20/20 0616  NA 126* 126* 126* 124* 123*  K 4.7 4.9 4.4 3.8 4.0  CL 90* 91* 89* 87* 86*  CO2 24 20* 26 26 28   GLUCOSE 105* 100* 106* 98 79  BUN 29* 33* 35* 29* 26*  CREATININE 0.82 0.90 1.03 0.91 0.87  CALCIUM 8.4* 8.3* 8.2* 7.7* 7.6*  MG  --   --  2.0 1.8 1.8   GFR: Estimated Creatinine Clearance: 68 mL/min (by C-G formula based on SCr of 0.87 mg/dL). Liver Function Tests: Recent Labs  Lab 05/16/20 2212 05/18/20 0628  AST 45* 42*  ALT 27 31  ALKPHOS 704* 660*  BILITOT 1.5* 1.2  PROT 6.9 6.0*  ALBUMIN 3.2* 2.9*   No results for input(s): LIPASE, AMYLASE in the last 168 hours. No results for input(s): AMMONIA in the last 168 hours. Coagulation Profile: No results for input(s): INR, PROTIME in the last 168 hours. Cardiac Enzymes: No results for input(s): CKTOTAL, CKMB, CKMBINDEX, TROPONINI in the last 168 hours. BNP (last 3 results) No results for input(s): PROBNP in the last 8760 hours. HbA1C: No results for input(s): HGBA1C in the last 72 hours. CBG: No results for input(s): GLUCAP in the last 168 hours. Lipid Profile: No results for input(s): CHOL, HDL, LDLCALC, TRIG, CHOLHDL, LDLDIRECT in the last 72 hours. Thyroid Function Tests: No results for input(s): TSH, T4TOTAL, FREET4, T3FREE, THYROIDAB in the last 72 hours. Anemia  Panel: No results for input(s): VITAMINB12, FOLATE, FERRITIN, TIBC, IRON, RETICCTPCT in the last 72 hours. Sepsis Labs: No results for input(s): PROCALCITON, LATICACIDVEN in the last 168 hours.  Recent Results (from the past 240 hour(s))  SARS Coronavirus 2 by RT PCR (hospital order, performed in [facility] hospital lab) Nasopharyngeal Nasopharyngeal Swab     Status: None   Collection Time: 05/16/20  9:42 PM   Specimen: Nasopharyngeal Swab  Result Value Ref Range Status   SARS Coronavirus 2 NEGATIVE NEGATIVE Final    Comment: (NOTE) SARS-CoV-2 target nucleic acids are NOT DETECTED.  The SARS-CoV-2 RNA is generally detectable in upper and lower respiratory specimens during the acute phase of infection. The lowest concentration of SARS-CoV-2 viral copies this assay can detect is 250 copies / mL. A negative result does not preclude SARS-CoV-2 infection and should not be used as the sole basis for treatment or other patient management decisions.  A negative result may occur with improper specimen collection / handling, submission of specimen other than nasopharyngeal swab, presence of viral mutation(s) within  the areas targeted by this assay, and inadequate number of viral copies (<250 copies / mL). A negative result must be combined with clinical observations, patient history, and epidemiological information.  Fact Sheet for Patients:   StrictlyIdeas.no  Fact Sheet for Healthcare Providers: BankingDealers.co.za  This test is not yet approved or  cleared by the [place] FDA and has been authorized for detection and/or diagnosis of SARS-CoV-2 by FDA under an Emergency Use Authorization (EUA).  This EUA will remain in effect (meaning this test can be used) for the duration of the COVID-19 declaration under Section 564(b)(1) of the Act, 21 U.S.C. section 360bbb-3(b)(1), unless the authorization is terminated or revoked  sooner.  Performed at [facility], [address], [place], [place] [address]          Radiology Studies: No results found.      Scheduled Meds: . apixaban  5 mg Oral BID  . Chlorhexidine Gluconate Cloth  6 each Topical Daily  . furosemide  80 mg Intravenous Q12H  . lisinopril  5 mg Oral Daily  . metoprolol succinate  50 mg Oral Daily  . spironolactone  12.5 mg Oral Daily   Continuous Infusions:        Aline August, MD [facility] 05/20/2020, 8:20 AM

## 2020-05-20 NOTE — Evaluation (Signed)
Physical Therapy Evaluation Patient Details Name: [name] MRN: [id number] DOB: 10-19-1950 Today's Date: 05/20/2020   History of Present Illness  69 y.o. male  with past medical history of urothelial cancer with mets to bones- last seen by Dr. Delton Coombes on 8/19 with plans to start [place], CAD, AVR with TAVR 09/2017, CHF with EF 20-25% on most recent echo, HTN, HLD, chronic bilateral PE  admitted on 05/16/2020 with CHF exacerbation. Palliative medicine consulted for goals of care.    Clinical Impression  Pt admitted with above diagnosis. Patient on 2L O2 during evaluation. Patient's movements slow and labored with complaints of pain with movement. Patient required supervision to min guard for all mobility and transitions for safety. Patient had a difficult time lifting his legs back into bed with labored movement. Patient required verbal cues for sequencing of steps and placement of hands in use of RW for sit to stand transfers. Patient very concerned about returning home without supplemental oxygen. Pt currently with functional limitations due to the deficits listed below (see PT Problem List). Pt will benefit from skilled PT to increase their independence and safety with mobility to allow discharge to the venue listed below.       Follow Up Recommendations Home health PT;SNF;Supervision for mobility/OOB;Supervision - Intermittent    Equipment Recommendations  None recommended by PT    Recommendations for Other Services       Precautions / Restrictions Precautions Precautions: Fall Restrictions Weight Bearing Restrictions: No      Mobility  Bed Mobility Overal bed mobility: Needs Assistance Bed Mobility: Supine to Sit;Sit to Supine     Supine to sit: Supervision Sit to supine: Supervision   General bed mobility comments: increased time and effort  Transfers Overall transfer level: Needs assistance Equipment used: Rolling walker (2 wheeled) Transfers: Sit to/from  [facility] Sit to Stand: Min guard Stand pivot transfers: Min guard   Ambulation/Gait Ambulation/Gait assistance: Min guard Gait Distance (Feet): 100 Feet Assistive device: Rolling walker (2 wheeled) Gait Pattern/deviations: Step-through pattern;Decreased step length - right;Decreased step length - left;Decreased stride length;Shuffle;Narrow base of support Gait velocity: decreased   General Gait Details: slow labored gait with complaints of whole body pain; limited by pain  Stairs     Wheelchair Mobility    Modified Rankin (Stroke Patients Only)       Balance Overall balance assessment: Needs assistance Sitting-balance support: No upper extremity supported;Feet unsupported Sitting balance-Leahy Scale: Good Sitting balance - Comments: seated at EOB   Standing balance support: Bilateral upper extremity supported;During functional activity Standing balance-Leahy Scale: Fair Standing balance comment: using RW         Pertinent Vitals/Pain Pain Assessment: 0-10 Pain Score: 8  Pain Location: "head to toe" Pain Intervention(s): Limited activity within patient's tolerance;Monitored during session    [place] expects to be discharged to:: Private residence Living Arrangements: Alone Available Help at Discharge: Family;Available PRN/intermittently Type of Home: House Home Access: Stairs to enter Entrance Stairs-Rails: Right Entrance Stairs-Number of Steps: 4 Home Layout: One level Home Equipment: Walker - 2 wheels;Cane - single point      Prior Function Level of Independence: Independent with assistive device(s);Needs assistance   Gait / Transfers Assistance Needed: household and short distanced community ambulator  ADL's / Homemaking Assistance Needed: pt reports HH aide and family assist with ADLs        Hand Dominance   Dominant Hand: Right    Extremity/Trunk Assessment   Upper Extremity Assessment Upper Extremity  Assessment: Generalized weakness    Lower Extremity Assessment Lower Extremity Assessment: Generalized weakness    Cervical / Trunk Assessment Cervical / Trunk Assessment: Normal  Communication   Communication: No difficulties  Cognition Arousal/Alertness: Awake/alert Behavior During Therapy: WFL for tasks assessed/performed Overall Cognitive Status: Within Functional Limits for tasks assessed   General Comments      Exercises     Assessment/Plan    PT Assessment Patient needs continued PT services  PT Problem List Decreased strength;Decreased mobility;Decreased activity tolerance;Decreased balance;Decreased knowledge of use of DME       PT Treatment Interventions DME instruction;Therapeutic activities;Gait training;Therapeutic exercise;Patient/family education;Balance training    PT Goals (Current goals can be found in the Care Plan section)  Acute Rehab PT Goals Patient Stated Goal: Go to rehab unless he can have home O2 as he "looses (his) wind" when trying to get out of bed and walk. PT Goal Formulation: With patient Time For Goal Achievement: 06/03/20 Potential to Achieve Goals: Fair    Frequency Min 3X/week   Barriers to discharge           AM-PAC PT "6 Clicks" Mobility  Outcome Measure Help needed turning from your back to your side while in a flat bed without using bedrails?: A Little Help needed moving from lying on your back to sitting on the side of a flat bed without using bedrails?: A Little Help needed moving to and from a bed to a chair (including a wheelchair)?: A Little Help needed standing up from a chair using your arms (e.g., wheelchair or bedside chair)?: A Little Help needed to walk in hospital room?: A Little Help needed climbing 3-5 steps with a railing? : A Lot 6 Click Score: 17    End of Session   Activity Tolerance: Patient tolerated treatment well;Patient limited by pain Patient left: in bed;with call bell/phone within reach;with bed  alarm set Nurse Communication: Mobility status PT Visit Diagnosis: Unsteadiness on feet (R26.81);Other abnormalities of gait and mobility (R26.89);Muscle weakness (generalized) (M62.81)    Time: 1500-1530 PT Time Calculation (min) (ACUTE ONLY): 30 min   Charges:   PT Evaluation $PT Eval Moderate Complexity: 1 Mod PT Treatments $Therapeutic Activity: 8-22 mins        Floria Raveling. Hartnett-Rands, MS, PT Per [place] [phone] 05/20/2020, 4:04 PM

## 2020-05-20 NOTE — Consult Note (Signed)
Consultation Note Date: 05/20/2020   Patient Name: [name]  DOB: [birth year]  MRN: [id number]  Age / Sex: 69 y.o., male  PCP: [name], MD Referring Physician: Aline August, MD  Reason for Consultation: Establishing goals of care  HPI/Patient Profile: 69 y.o. male  with past medical history of urothelial cancer with mets to bones- last seen by Dr. Delton Coombes on 8/19 with plans to start [place], CAD, AVR with TAVR 09/2017, CHF with EF 20-25% on most recent echo, HTN, HLD, chronic bilateral PE  admitted on 05/16/2020 with CHF exacerbation. Palliative medicine consulted for goals of care.    Clinical Assessment and Goals of Care: Evaluated patient and met with him at bedside.  He was living at home prior to admission- receives help from family members- primarily his sister. He was able to walk and participate in ADL's.  Mr. Rakestraw shares with me that he is peaceful with where he is in life. He knows that he is going to die- however, he believes that God is not ready for him yet. He feels that as long as he is able to walk, then he wishes to continue medical care, return hospitalizations, and continuing with his chemotherapy.  He is hopeful to stabilize this admission and return home.   His EOL wishes are that if he is dying and his medical providers do not feel they can continue to treat him- he preference would be to die at home. However, he again notes that he is not in that place yet.   Primary Decision Maker PATIENT    SUMMARY OF RECOMMENDATIONS -Continue current care -PT consult to eval if patient is able to walk -PMT will continue to follow and discuss goals of care -Recommend outpatient Palliative to see at home after discharge    Code Status/Advance Care Planning:  DNR   Additional Recommendations (Limitations, Scope, Preferences):  Full Scope Treatment  Prognosis:     Unable to determine- although given patient's frailty and recurrent hospitalizations I worry that his time is limited likely to months  Discharge Planning: To Be Determined  Primary Diagnoses: Present on Admission: . Acute on chronic combined systolic and diastolic CHF (congestive heart failure) ([place]) . Essential hypertension, benign . Severe aortic insufficiency . Depression, major, single episode, mild ([place])   I have reviewed the medical record, interviewed the patient and family, and examined the patient. The following aspects are pertinent.  Past Medical History:  Diagnosis Date  . Arterial embolus of lower extremity ([place]) 11/23/2013  . Arthritis   . CAD (coronary artery disease)    a. s/p PCI with 3.5 x 18 BMS to mid RCA, non obst LAD, Lcx patent 08/10/13  . Cancer ([place]) 2021   bladder  . Depression, major, single episode, mild ([place]) 12/20/2019  . ED (erectile dysfunction) 03/27/2015  . Embolus of femoral artery [facility]) 2012   Surgical repair, previously on Coumadin  . Essential hypertension   . ETOH abuse   . Hematuria 10/17/2019  . Hernia of abdominal cavity  02/27/2019  . History of DVT (deep vein thrombosis) 2011  . History of GI bleed    February 2012   . Ingrown left greater toenail 09/04/2018  . 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 Low oxygen saturation 01/31/2020  . 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 Pain with urination 10/17/2019  . Port-A-Cath in place 02/23/2020   right  . Prosthetic valve endocarditis ([place])   . PVD (peripheral vascular disease) ([place])   . S/P aortic valve replacement    Details not clear -  reportedly 1971 per patient   Social History   Socioeconomic History  . Marital status: Single    Spouse name: Not on file  . Number of children: 2  . Years of education: Not on file  . Highest education level: 11th grade  Occupational History  . Occupation: disability   Tobacco Use  . Smoking status: Former Smoker    Packs/day: 0.50    Years: 3.00    Pack years: 1.50    Types: Cigarettes    Start date: 10/12/1965    Quit date: 02/19/1970    Years since quitting: 50.2  . Smokeless tobacco: Never Used  Vaping Use  . Vaping Use: Never used  Substance and Sexual Activity  . Alcohol use: Not Currently  . Drug use: Yes    Types: Marijuana    Comment: pt smokes weed once in the morning and once in the evening  . Sexual activity: Not Currently  Other Topics Concern  . Not on file  Social History Narrative  . Not on file   Social Determinants of Health   Financial Resource Strain: Low Risk   . Difficulty of Paying Living Expenses: Not very hard  Food Insecurity: No Food Insecurity  . Worried About Charity fundraiser in the Last Year: Never true  . Ran Out of Food in the Last Year: Never true  Transportation Needs:   . Lack of Transportation (Medical): Not on file  . Lack of Transportation (Non-Medical): Not on file  Physical Activity: Inactive  . Days of Exercise per Week: 0 days  . Minutes of Exercise per Session: 0 min  Stress:   . Feeling of Stress : Not on file  Social Connections: Moderately Isolated  . Frequency of Communication with Friends and Family: Twice a week  . Frequency of Social Gatherings with Friends and Family: Twice a week  . Attends Religious Services: Never  . Active Member of Clubs or Organizations: Yes  . Attends Archivist Meetings: Never  . Marital Status: Divorced   Family History  Problem Relation Age of Onset  . Diabetes Mother   . Cirrhosis Father   . COPD Sister    Scheduled Meds: . apixaban  5 mg Oral BID  . Chlorhexidine Gluconate Cloth  6 each Topical Daily  . furosemide  80 mg Intravenous TID  . lisinopril  5 mg Oral Daily  . metoprolol succinate  50 mg Oral Daily  . spironolactone  12.5 mg Oral Daily   Continuous Infusions: PRN Meds:.acetaminophen, methocarbamol, morphine injection,  ondansetron (ZOFRAN) IV, oxyCODONE, sodium chloride flush Medications Prior to Admission:  Prior to Admission medications   Medication Sig Start Date End Date Taking? Authorizing Provider  acetaminophen (TYLENOL) 325 MG tablet Take 2 tablets (650 mg total) by mouth every 6 (six) hours as needed for mild pain or moderate  pain. Patient taking differently: Take 325 mg by mouth every 6 (six) hours as needed for mild pain or moderate pain.  04/25/20  Yes Emokpae, Courage, MD  Apixaban Starter Pack, 86m and 531m (ELIQUIS DVT/PE STARTER PACK) Take as directed on package: start with two-72m59mablets twice daily for 7 days. On day 8, switch to one-72mg872mblet twice daily. Patient taking differently: Take 5 mg by mouth in the morning and at bedtime.  04/25/20  Yes Emokpae, Courage, MD  aspirin EC 81 MG tablet Take 1 tablet (81 mg total) by mouth daily. 11/01/17  Yes SimpFayrene Helper  atorvastatin (LIPITOR) 40 MG tablet Take 1 tablet (40 mg total) by mouth daily. 03/21/20  Yes MillPerlie Mayo  busPIRone (BUSPAR) 10 MG tablet Take 1 tablet (10 mg total) by mouth 2 (two) times daily. 12/20/19  Yes MillPerlie Mayo  cholecalciferol (VITAMIN D) 1000 units tablet Take 1 tablet (1,000 Units total) by mouth daily. 11/01/17  Yes SimpFayrene Helper  clonazePAM (KLONOPIN) 0.5 MG tablet Take 1 tablet (0.5 mg total) by mouth at bedtime. 04/25/20  Yes Emokpae, Courage, MD  escitalopram (LEXAPRO) 10 MG tablet Take 1 tablet (10 mg total) by mouth daily. 12/20/19  Yes MillPerlie Mayo  furosemide (LASIX) 20 MG tablet Take 1 tablet (20 mg total) by mouth daily. Patient taking differently: Take 10 mg by mouth daily.  04/25/20  Yes Emokpae, Courage, MD  lidocaine-prilocaine (EMLA) cream Apply 1 application topically as needed.   Yes [provider]  lisinopril (ZESTRIL) 5 MG tablet Take 1 tablet (5 mg total) by mouth daily. 04/25/20 06/24/20 Yes Emokpae, Courage, MD  magnesium oxide (MAG-OX) 400 (241.3 Mg) MG  tablet Take 1 tablet (400 mg total) by mouth 2 (two) times daily. 04/04/20  Yes KatrDerek Jack  metoprolol succinate (TOPROL-XL) 50 MG 24 hr tablet TAKE 1 TABLET BY MOUTH ONCE DAILY WITH&nbsp;&nbsp;OR&nbsp;&nbsp;IMMEDIATELY&nbsp;&nbsp;FOLLOWING&nbsp;&nbsp;A&nbsp;&nbsp;MEAL Patient taking differently: Take 50 mg by mouth daily. WITH  OR  IMMEDIATELY  FOLLOWING  A  MEAL 03/22/20  Yes MillPerlie Mayo  montelukast (SINGULAIR) 10 MG tablet Take 1 tablet (10 mg total) by mouth at bedtime. 03/22/20  Yes MillPerlie Mayo  oxyCODONE (OXY IR/ROXICODONE) 5 MG immediate release tablet Take 1 tablet (5 mg total) by mouth every 4 (four) hours as needed for moderate pain or severe pain. Patient taking differently: Take 5 mg by mouth every 6 (six) hours as needed for moderate pain or severe pain.  04/25/20  Yes Emokpae, Courage, MD  pantoprazole sodium (PROTONIX) 40 mg/20 mL PACK Place 40 mg into feeding tube daily.   Yes [provider]  prochlorperazine (COMPAZINE) 10 MG tablet Take 10 mg by mouth every 6 (six) hours as needed for nausea or vomiting.   Yes [provider]  spironolactone (ALDACTONE) 25 MG tablet Take 0.5 tablets (12.5 mg total) by mouth daily. 04/26/20  Yes EmokRoxan Hockey  fluticasone (FLONASE) 50 MCG/ACT nasal spray Use 2 spray(s) in each nostril once daily Patient not taking: Reported on 05/17/2020 03/22/20   MillPerlie Mayo  Misc. Devices (ADJUSTABLE COMMODE 3-IN-1) MISC Dx R26.81 Patient not taking: Reported on 05/17/2020 05/16/20   SimpFayrene Helper  nitroGLYCERIN (NITROSTAT) 0.4 MG SL tablet Place 1 tablet (0.4 mg total) under the tongue every 5 (five) minutes as needed for chest pain. 12/20/19   BerrLorretta Harp  pantoprazole (PROTONIX) 40 MG tablet Take 1 tablet (40 mg  total) by mouth daily before breakfast. Patient not taking: Reported on 05/17/2020 02/03/20   Damita Lack, MD  senna-docusate (SENOKOT-S) 8.6-50 MG tablet Take 2 tablets by mouth  at bedtime. Patient not taking: Reported on 05/17/2020 04/25/20 04/25/21  Roxan Hockey, MD  trolamine salicylate (ASPERCREME) 10 % cream Apply 1 application topically daily as needed for muscle pain (for pain in feet). For pain in legs (swelling) Patient not taking: Reported on 05/17/2020    [provider]   No Known Allergies Review of Systems  Constitutional: Positive for activity change and fatigue.  Respiratory: Positive for shortness of breath.   Cardiovascular: Positive for leg swelling.  All other systems reviewed and are negative.   Physical Exam Vitals and nursing note reviewed.  Constitutional:      General: He is not in acute distress.    Comments: frail  Pulmonary:     Effort: Pulmonary effort is normal.  Neurological:     Mental Status: He is oriented to person, place, and time.     Vital Signs: BP (!) 96/57   Pulse 72   Temp 98.3 F (36.8 C) (Oral)   Resp 18   Ht '5\' 4"'  (1.626 m)   Wt 63.8 kg   SpO2 100%   BMI 24.14 kg/m  Pain Scale: 0-10 POSS *See Group Information*: S-Acceptable,Sleep, easy to arouse Pain Score: 9    SpO2: SpO2: 100 % O2 Device:SpO2: 100 % O2 Flow Rate: .O2 Flow Rate (L/min): 2 L/min  IO: Intake/output summary:   Intake/Output Summary (Last 24 hours) at 05/20/2020 1410 Last data filed at 05/20/2020 1200 Gross per 24 hour  Intake 720 ml  Output 2100 ml  Net -1380 ml    LBM: Last BM Date: 05/19/20 Baseline Weight: Weight: 68 kg Most recent weight: Weight: 63.8 kg     Palliative Assessment/Data: PPS: 50%     Thank you for this consult. Palliative medicine will continue to follow and assist as needed.   Time In: 1302 Time Out: 1409 Time Total: 67 minutes Greater than 50%  of this time was spent counseling and coordinating care related to the above assessment and plan.  Signed by: Mariana Kaufman, AGNP-C Palliative Medicine    Please contact Palliative Medicine Team phone at [phone] for questions and concerns.    For individual provider: See Shea Evans

## 2020-05-20 NOTE — Progress Notes (Signed)
Progress Note  Patient Name: [name] Date of Encounter: 05/20/2020  [facility] Cardiologist: Rozann Lesches, MD   Subjective   Ongoing SOB  Inpatient Medications    Scheduled Meds: . apixaban  5 mg Oral BID  . Chlorhexidine Gluconate Cloth  6 each Topical Daily  . furosemide  80 mg Intravenous Q12H  . lisinopril  5 mg Oral Daily  . metoprolol succinate  50 mg Oral Daily  . spironolactone  12.5 mg Oral Daily   Continuous Infusions:  PRN Meds: acetaminophen, methocarbamol, morphine injection, ondansetron (ZOFRAN) IV, oxyCODONE, sodium chloride flush   Vital Signs    Vitals:   05/19/20 0956 05/19/20 1702 05/19/20 2016 05/20/20 0700  BP: 115/62 99/65 (!) 96/57   Pulse: 87 74 72   Resp: 16 18 18    Temp:  98.4 F (36.9 C) 98.3 F (36.8 C)   TempSrc:  Oral Oral   SpO2: 100% 99% 100%   Weight:    63.8 kg  Height:        Intake/Output Summary (Last 24 hours) at 05/20/2020 1112 Last data filed at 05/20/2020 1027 Gross per 24 hour  Intake --  Output 2100 ml  Net -2100 ml   Last 3 Weights 05/20/2020 05/19/2020 05/17/2020  Weight (lbs) 140 lb 10.5 oz 141 lb 8.6 oz 139 lb 15.9 oz  Weight (kg) 63.8 kg 64.2 kg 63.5 kg      Telemetry    NSR- Personally Reviewed  ECG    n/a - Personally Reviewed  Physical Exam   GEN: No acute distress.   Neck: No JVD Cardiac: RRR, no murmurs, rubs, or gallops.  Respiratory: Clear to auscultation bilaterally. GI: Soft, nontender, non-distended  MS: 2+ bilateral LE edema; No deformity. Neuro:  Nonfocal  Psych: Normal affect   Labs    High Sensitivity Troponin:   Recent Labs  Lab 04/21/20 1412 04/21/20 1630 05/16/20 2212 05/17/20 0225  TROPONINIHS 32* 32* 34* 61*      Chemistry Recent Labs  Lab 05/16/20 2212 05/17/20 0225 05/18/20 4982 05/19/20 0729 05/20/20 0616  NA  --    < > 126* 124* 123*  K  --    < > 4.4 3.8 4.0  CL  --    < > 89* 87* 86*  CO2  --    < > 26 26 28   GLUCOSE  --    < > 106* 98  79  BUN  --    < > 35* 29* 26*  CREATININE  --    < > 1.03 0.91 0.87  CALCIUM  --    < > 8.2* 7.7* 7.6*  PROT 6.9  --  6.0*  --   --   ALBUMIN 3.2*  --  2.9*  --   --   AST 45*  --  42*  --   --   ALT 27  --  31  --   --   ALKPHOS 704*  --  660*  --   --   BILITOT 1.5*  --  1.2  --   --   GFRNONAA  --    < > >60 >60 >60  GFRAA  --    < > >60 >60 >60  ANIONGAP  --    < > 11 11 9    < > = values in this interval not displayed.     Hematology Recent Labs  Lab 05/16/20 1324 05/18/20 0628  WBC 10.4 10.6*  RBC 4.33 4.18*  HGB 11.5* 11.4*  HCT 37.3* 35.7*  MCV 86.1 85.4  MCH 26.6 27.3  MCHC 30.8 31.9  RDW 25.3* 25.1*  PLT 192 185    BNP Recent Labs  Lab 05/16/20 1324  BNP >4,500.0*     DDimer No results for input(s): DDIMER in the last 168 hours.   Radiology    No results found.  Cardiac Studies    Patient Profile     SEVASTIAN WITCZAK is a 69 y.o. male with past medical history of CAD (s/p BMS to RCA in 2014), AVR (s/p bioprosthetic AVR and ultimately valve in valve TAVR in 09/2017 due to torncoronary cusp of his previously placed bioprosthetic AVR), chronic combined systolic and diastolic CHF (EF 88-87% by echo in 12/2019, at 20-25% by repeat echo in 04/2020), PAD (s/p left SFA intervention in 10/2018), HTN, HLD, recently diagnosed bilateral PE, metastatic urothelial cancer (diagnosed in 10/2019), chronic anemia and history of DVT who is being seen today for the evaluation of CHF at the request of Dr. Starla Link.   Assessment & Plan    1. Acute on chronic combined systolic/diastolic HF - 01/7971 echo LVEF 25-30%, severe RV dysfunction, severe BAE, mild to mod MR,  - 12/2019 echo LVEF 45-50% - BNP greater than 4500. CXR shows a mild right pleural effusion with associated atelectasis  - seen by Dr Domenic Polite on initial consultation who is also his regular cardiologist, due to multiple comorbidities including metstastic urothelial cancer and bilateral PEs no plans for cath at  this time.   Incomplete I/Os, 2.5 L of uop documented. Recorded weights are stable. He is on IV lasix 80mg  bid, Cr has trended down with diuresis consistent with venous congestion and CHF. - medical therapy with lisinopril 5mg , toprol 50, adlactone 12.5mg  daily.   - remaisn signfiicantly volume overloaded, difficult to gauge progress given limited I/Os and weight data. Will try increasing lasix 80mg  tid.   2. History of AVR - mod paravalvular leak on 04/2020 echo  3. History of PE - he is on eliquis     For questions or updates, please contact [place] Please consult [email or web address] for contact info under        Signed, Carlyle Dolly, MD  05/20/2020, 11:12 AM

## 2020-05-20 NOTE — Care Management Important Message (Signed)
Important Message  Patient Details  Name: [name] MRN: [id number] Date of Birth: 07-23-1951   Medicare Important Message Given:  Yes     [name] 05/20/2020, 3:52 PM

## 2020-05-21 LAB — BASIC METABOLIC PANEL
Anion gap: 10 (ref 5–15)
Anion gap: 9 (ref 5–15)
BUN: 25 mg/dL — ABNORMAL HIGH (ref 8–23)
BUN: 24 mg/dL — ABNORMAL HIGH (ref 8–23)
CO2: 27 mmol/L (ref 22–32)
CO2: 27 mmol/L (ref 22–32)
Calcium: 7.9 mg/dL — ABNORMAL LOW (ref 8.9–10.3)
Calcium: 7.5 mg/dL — ABNORMAL LOW (ref 8.9–10.3)
Chloride: 85 mmol/L — ABNORMAL LOW (ref 98–111)
Chloride: 85 mmol/L — ABNORMAL LOW (ref 98–111)
Creatinine, Ser: 0.78 mg/dL (ref 0.61–1.24)
Creatinine, Ser: 0.71 mg/dL (ref 0.61–1.24)
GFR calc Af Amer: >60 mL/min (ref >60)
GFR calc Af Amer: >60 mL/min (ref >60)
GFR calc non Af Amer: >60 mL/min (ref >60)
GFR calc non Af Amer: >60 mL/min (ref >60)
Glucose, Bld: 79 mg/dL (ref 70–99)
Glucose, Bld: 145 mg/dL — ABNORMAL HIGH (ref 70–99)
Potassium: 4.2 mmol/L (ref 3.5–5.1)
Potassium: 3.8 mmol/L (ref 3.5–5.1)
Sodium: 122 mmol/L — ABNORMAL LOW (ref 135–145)
Sodium: 121 mmol/L — ABNORMAL LOW (ref 135–145)

## 2020-05-21 LAB — CBC WITH DIFFERENTIAL/PLATELET
Abs Immature Granulocytes: 0.13 10*3/uL — ABNORMAL HIGH (ref 0.00–0.07)
Basophils Absolute: 0 10*3/uL (ref 0.0–0.1)
Basophils Relative: 0 %
Eosinophils Absolute: 0.2 10*3/uL (ref 0.0–0.5)
Eosinophils Relative: 1 %
HCT: 34.9 % — ABNORMAL LOW (ref 39.0–52.0)
Hemoglobin: 10.9 g/dL — ABNORMAL LOW (ref 13.0–17.0)
Immature Granulocytes: 1 %
Lymphocytes Relative: 9 %
Lymphs Abs: 1 10*3/uL (ref 0.7–4.0)
MCH: 27 pg (ref 26.0–34.0)
MCHC: 31.2 g/dL (ref 30.0–36.0)
MCV: 86.4 fL (ref 80.0–100.0)
Monocytes Absolute: 1.1 10*3/uL — ABNORMAL HIGH (ref 0.1–1.0)
Monocytes Relative: 10 %
Neutro Abs: 8.4 10*3/uL — ABNORMAL HIGH (ref 1.7–7.7)
Neutrophils Relative %: 79 %
Platelets: 115 10*3/uL — ABNORMAL LOW (ref 150–400)
RBC: 4.04 MIL/uL — ABNORMAL LOW (ref 4.22–5.81)
RDW: 23.9 % — ABNORMAL HIGH (ref 11.5–15.5)
WBC: 10.8 10*3/uL — ABNORMAL HIGH (ref 4.0–10.5)
nRBC: 0 % (ref 0.0–0.2)

## 2020-05-21 LAB — MAGNESIUM: Magnesium: 1.9 mg/dL (ref 1.7–2.4)

## 2020-05-21 LAB — SODIUM: Sodium: 124 mmol/L — ABNORMAL LOW (ref 135–145)

## 2020-05-21 MED ORDER — TOLVAPTAN 15 MG PO TABS
15.0000 mg | ORAL_TABLET | ORAL | Status: DC
  Administered 2020-05-22 – 2020-05-29 (×8): 15 mg via ORAL
  Filled 2020-05-21 (×12): qty 1

## 2020-05-21 NOTE — Progress Notes (Signed)
Hydrologist [facility])  Hospital Liaison: RN note         Notified by [facility] manager Tobi Bastos, CSW  of patient/family request for [facility] Palliative services at home after discharge.         Writer spoke with patient to confirm interest and explain services.               [facility] Palliative team will follow up with patient after discharge.         Please call with any hospice or palliative related questions.         Thank you for this referral.         Farrel Gordon, RN, CCM  [facility] (listed on [place] under Hospice[facility])    [phone]

## 2020-05-21 NOTE — Progress Notes (Signed)
Physical Therapy Treatment Patient Details Name: [name] MRN: [id number] DOB: April 24, 1951 Today's Date: 05/21/2020    History of Present Illness 69 y.o. male  with past medical history of urothelial cancer with mets to bones- last seen by Dr. Delton Coombes on 8/19 with plans to start [place], CAD, AVR with TAVR 09/2017, CHF with EF 20-25% on most recent echo, HTN, HLD, chronic bilateral PE  admitted on 05/16/2020 with CHF exacerbation. Palliative medicine consulted for goals of care.    PT Comments    Pt tolerates seated exercises with cues for form and pursed lip breathing. Pt ambulates in hallway, initially with min guard, but improves to SUPV with distance and pt reports "I gotta shake the stiffness out". Pt on RA throughout session with SpO2 >90%, but repeatedly checks for O2 nasal cannula in case he gets SOB and reports it makes him feel comfortable to know it is close by. Pt returned 2L O2 at EOS while seated EOB with bed alarm on and call bell in lap. Patient will benefit from continued physical therapy in hospital and recommendations below to increase strength, balance, endurance for safe ADLs and gait.    Follow Up Recommendations  Home health PT;SNF;Supervision for mobility/OOB;Supervision - Intermittent     Equipment Recommendations  None recommended by PT    Recommendations for Other Services       Precautions / Restrictions Precautions Precautions: Fall Precaution Comments: bil nephrostomy drains Restrictions Weight Bearing Restrictions: No    Mobility  Bed Mobility Overal bed mobility: Needs Assistance Bed Mobility: Supine to Sit  Supine to sit: Supervision  General bed mobility comments: slow, labored movement to come to sitting EOB  Transfers Overall transfer level: Needs assistance Equipment used: Rolling walker (2 wheeled) Transfers: Sit to/from Stand Sit to Stand: Min guard  General transfer comment: increased time, BLE braced on bed, static standing  once up before attempting to take steps  Ambulation/Gait Ambulation/Gait assistance: Min guard;Supervision Gait Distance (Feet): 120 Feet Assistive device: Rolling walker (2 wheeled) Gait Pattern/deviations: Step-through pattern;Decreased stride length;Narrow base of support Gait velocity: decreased   General Gait Details: slow, labored cadence, initially stiff, but improves with distance, mild SOB noted but pt able to continue conversation, no LOB   Stairs             Wheelchair Mobility    Modified Rankin (Stroke Patients Only)       Balance Overall balance assessment: Needs assistance Sitting-balance support: No upper extremity supported;Feet supported Sitting balance-Leahy Scale: Good Sitting balance - Comments: seated EOB   Standing balance support: Bilateral upper extremity supported;During functional activity Standing balance-Leahy Scale: Poor Standing balance comment: reliant on RW         Cognition Arousal/Alertness: Awake/alert Behavior During Therapy: WFL for tasks assessed/performed Overall Cognitive Status: Within Functional Limits for tasks assessed          Exercises General Exercises - Lower Extremity Long Arc Quad: AROM;Strengthening;Both;10 reps;Seated Hip Flexion/Marching: AROM;Strengthening;Both;10 reps;Seated    General Comments General comments (skin integrity, edema, etc.): on RA with SpO2 >90% during activity with mild SOB, cued for pursed lip breathing      Pertinent Vitals/Pain Pain Assessment: 0-10 Pain Score: 9  Pain Location: "I'm old and wore out" Pain Descriptors / Indicators: Aching;Sore Pain Intervention(s): Limited activity within patient's tolerance;Monitored during session    Home Living                      Prior Function  PT Goals (current goals can now be found in the care plan section) Acute Rehab PT Goals Patient Stated Goal: Go to rehab unless he can have home O2 as he "looses (his)  wind" when trying to get out of bed and walk. PT Goal Formulation: With patient Time For Goal Achievement: 06/03/20 Potential to Achieve Goals: Fair Progress towards PT goals: Progressing toward goals    Frequency    Min 3X/week      PT Plan Current plan remains appropriate    Co-evaluation              AM-PAC PT "6 Clicks" Mobility   Outcome Measure  Help needed turning from your back to your side while in a flat bed without using bedrails?: None Help needed moving from lying on your back to sitting on the side of a flat bed without using bedrails?: None Help needed moving to and from a bed to a chair (including a wheelchair)?: None Help needed standing up from a chair using your arms (e.g., wheelchair or bedside chair)?: A Little Help needed to walk in hospital room?: A Little Help needed climbing 3-5 steps with a railing? : A Lot 6 Click Score: 20    End of Session   Activity Tolerance: Patient tolerated treatment well Patient left: in bed;with call bell/phone within reach;with bed alarm set Nurse Communication: Mobility status PT Visit Diagnosis: Unsteadiness on feet (R26.81);Other abnormalities of gait and mobility (R26.89);Muscle weakness (generalized) (M62.81)     Time: 3976-7341 PT Time Calculation (min) (ACUTE ONLY): 23 min  Charges:  $Gait Training: 8-22 mins $Therapeutic Exercise: 8-22 mins                      Tori Ladarrius Bogdanski PT, DPT 05/21/20, 12:06 PM [phone]

## 2020-05-21 NOTE — Progress Notes (Signed)
Progress Note  Patient Name: [name] Date of Encounter: 05/21/2020  [facility] Cardiologist: Rozann Lesches, MD   Subjective   Ongoing SOB  Inpatient Medications    Scheduled Meds: . apixaban  5 mg Oral BID  . Chlorhexidine Gluconate Cloth  6 each Topical Daily  . furosemide  80 mg Intravenous TID  . lisinopril  5 mg Oral Daily  . metoprolol succinate  50 mg Oral Daily  . spironolactone  12.5 mg Oral Daily   Continuous Infusions:  PRN Meds: acetaminophen, methocarbamol, morphine injection, ondansetron (ZOFRAN) IV, oxyCODONE, sodium chloride flush   Vital Signs    Vitals:   05/19/20 2016 05/20/20 0700 05/20/20 2040 05/21/20 0509  BP: (!) 96/57  (!) 95/58 96/67  Pulse: 72  73 80  Resp: 18  16 16   Temp: 98.3 F (36.8 C)  98.1 F (36.7 C) 97.8 F (36.6 C)  TempSrc: Oral     SpO2: 100%  100% 100%  Weight:  63.8 kg    Height:        Intake/Output Summary (Last 24 hours) at 05/21/2020 0917 Last data filed at 05/21/2020 0900 Gross per 24 hour  Intake 480 ml  Output 3100 ml  Net -2620 ml   Last 3 Weights 05/20/2020 05/19/2020 05/17/2020  Weight (lbs) 140 lb 10.5 oz 141 lb 8.6 oz 139 lb 15.9 oz  Weight (kg) 63.8 kg 64.2 kg 63.5 kg      Telemetry    SR - Personally Reviewed  ECG    n/a - Personally Reviewed  Physical Exam   GEN: No acute distress.   Neck: No JVD Cardiac: RRR, 2/6 systolic murmur rusb Respiratory: Clear to auscultation bilaterally. GI: Soft, nontender, non-distended  MS: 2+ bilateral LE edema; No deformity. Neuro:  Nonfocal  Psych: Normal affect   Labs    High Sensitivity Troponin:   Recent Labs  Lab 04/21/20 1412 04/21/20 1630 05/16/20 2212 05/17/20 0225  TROPONINIHS 32* 32* 34* 61*      Chemistry Recent Labs  Lab 05/16/20 2212 05/17/20 0225 05/18/20 3419 05/18/20 3790 05/19/20 0729 05/20/20 0616 05/21/20 0644  NA  --    < > 126*   < > 124* 123* 122*  K  --    < > 4.4   < > 3.8 4.0 4.2  CL  --    < >  89*   < > 87* 86* 85*  CO2  --    < > 26   < > 26 28 27   GLUCOSE  --    < > 106*   < > 98 79 79  BUN  --    < > 35*   < > 29* 26* 25*  CREATININE  --    < > 1.03   < > 0.91 0.87 0.78  CALCIUM  --    < > 8.2*   < > 7.7* 7.6* 7.9*  PROT 6.9  --  6.0*  --   --   --   --   ALBUMIN 3.2*  --  2.9*  --   --   --   --   AST 45*  --  42*  --   --   --   --   ALT 27  --  31  --   --   --   --   ALKPHOS 704*  --  660*  --   --   --   --   BILITOT 1.5*  --  1.2  --   --   --   --   GFRNONAA  --    < > >60   < > >60 >60 >60  GFRAA  --    < > >60   < > >60 >60 >60  ANIONGAP  --    < > 11   < > 11 9 10    < > = values in this interval not displayed.     Hematology Recent Labs  Lab 05/16/20 1324 05/18/20 0628 05/21/20 0644  WBC 10.4 10.6* 10.8*  RBC 4.33 4.18* 4.04*  HGB 11.5* 11.4* 10.9*  HCT 37.3* 35.7* 34.9*  MCV 86.1 85.4 86.4  MCH 26.6 27.3 27.0  MCHC 30.8 31.9 31.2  RDW 25.3* 25.1* 23.9*  PLT 192 185 115*    BNP Recent Labs  Lab 05/16/20 1324  BNP >4,500.0*     DDimer No results for input(s): DDIMER in the last 168 hours.   Radiology    No results found.  Cardiac Studies     Patient Profile     [name] a 69 y.o.malewith past medical history of CAD (s/p BMS to RCA in 2014), AVR(s/p bioprosthetic AVR and ultimately valve in valve TAVR in 09/2017 due to torncoronary cusp of his previously placed bioprosthetic AVR), chronic combined systolic and diastolic CHF (EF 74-08% by echo in 12/2019, at 20-25% by repeat echo in 04/2020),PAD (s/p left SFA intervention in 10/2018), HTN, HLD, recently diagnosed bilateral PE, metastatic urothelial cancer(diagnosed in 10/2019), chronic anemia and history of DVTwho is being seen today for the evaluation ofCHFat the request of Dr. Starla Link.   Assessment & Plan    1. Acute on chronic combined systolic/diastolic HF - 09/4479 echo LVEF 25-30%, severe RV dysfunction, severe BAE, mild to mod MR,  - 12/2019 echo LVEF 45-50% -  BNP greater than 4500. CXR shows a mild right pleural effusion with associated atelectasis  - seen by Dr Domenic Polite on initial consultation who is also his regular cardiologist, due to multiple comorbidities including metstastic urothelial cancer and bilateral PEs no plans for cath at this time.   Incomplete I/Os, this admission, documented neg 2.2 L yesterday. Yesterday we increased lasix to 80mg  tid. Downtrend in Cr with diuresis consistent with venous congestion and CHF. Chronic issues with hyponateremia, discussed with primary team starting talvaptan. If significant worsening will need to pull back on diuretics.   - medical therapy with lisinopril 5mg , toprol 50, adlactone 12.5mg  daily.   - continue IV diuretics.   2. History of AVR - mod paravalvular leak on 04/2020 echo  3. History of PE - he is on eliquis   For questions or updates, please contact [place] Please consult [email or web address] for contact info under        Signed, Carlyle Dolly, MD  05/21/2020, 9:17 AM

## 2020-05-21 NOTE — TOC Progression Note (Signed)
Transition of Care [facility]) - Progression Note   Patient Details  Name: [name] MRN: [id number] Date of Birth: 1950/12/01  Transition of Care [facility]) CM/SW [place], LCSW Phone Number: 05/21/2020, 2:32 PM  Clinical Narrative: Patient was seen by palliative and outpatient palliative was recommended. CSW spoke with patient regarding OP palliative. Patient agreeable to referral. CSW made referral to [facility] and spoke with Bevely Palmer. TOC to follow up with [facility] at patient's discharge.  Expected Discharge Plan: [place] Barriers to Discharge: Continued Medical Work up  Expected Discharge Plan and Services Expected Discharge Plan: [place] In-house Referral: Clinical Social Work Post Acute Care Choice: [place] arrangements for the past 2 months: [place] Arranged: RN, PT [facility] Agency: [place] Date [place]: 05/17/20 Time [place]: [phone] Representative spoke with at [place]: Ashley Murrain  Readmission Risk Interventions Readmission Risk Prevention Plan 05/17/2020 04/22/2020 03/07/2020  Transportation Screening Complete Complete Complete  PCP or Specialist Appt within 3-5 Days - - Not Complete  Not Complete comments - - Assessment completed during second shift  [place] or [place] - - Complete  Social Work Consult for [place] Planning/Counseling - - Complete  Palliative Care Screening - - Not Applicable  Medication Review Press photographer) Complete Complete Complete  PCP or Specialist appointment within 3-5 days of discharge - Complete -  [place] or Home Care Consult Complete Complete -  SW Recovery Care/Counseling Consult Complete Complete -  Palliative Care Screening Not Applicable Complete -  [place] Not Applicable Not Applicable -  Some recent data might be hidden

## 2020-05-21 NOTE — Progress Notes (Addendum)
Patient ID: [name], male   DOB: [birth year], 69 y.o.   MRN: [id number]  PROGRESS NOTE    [name]  YQM:[id number] DOB: 1951-07-27 DOA: 05/16/2020 PCP: [name], MD   Brief Narrative:  69 y.o. male with medical history significant for systolic and diastolic CHF, metastatic urothelial cancer status post bilateral nephrostomy tubes, aortic valve replacement, hypertension, peripheral artery disease, pulmonary embolism, recurrent recent hospitalizations for CHF exacerbation/PE/UTI/hyponatremia/acute on chronic anemia presented with difficulty breathing and leg swelling.  On presentation, BNP was more than 4500, chest x-ray showed mild right pleural effusion, right basilar atelectasis or infiltrate and sodium was 126.  He was started on IV Lasix.  Cardiology was consulted  Assessment & Plan:   Acute on chronic combined systolic and diastolic heart failure -Patient is massively volume overloaded with BNP more than 4500 -Recent echo on 04/23/2020 had shown EF of 20 to 25% -Continue IV Lasix 80 mg twice a day. strict input and output.  Daily weights.  Fluid restriction.  Negative balance of 8535 cc since admission.  Still appears grossly volume overloaded -Overall prognosis is very poor due to recent recurrent hospitalizations.  Palliative care evaluation appreciated.  Patient is DNR.  Continue lisinopril, metoprolol and spironolactone  Hyponatremia: Chronic -Patient baseline low to mid 120s.  Sodium 122 today.  Due to marked volume overload; will continue Lasix as above.  Communicated with cardiology as well as nephrology/Dr. Marval Regal.  Dr. Marval Regal recommended that it is okay to use tolvaptan and to stop lisinopril and spironolactone.  Will order tolvaptan; stop lisinopril and spironolactone.  Monitor BMP.  CAD with history of stenting -Currently chest pain-free.  Continue metoprolol  History of AVR -Recent echo showed moderate paravalvular leak involving valve and valve  TAVR: This is being managed conservatively as he is not a good candidate for intervention.  Bilateral PE -Continue Eliquis  Metastatic urothelial carcinoma status post bilateral nephrostomy tubes for hydronephrosis -Outpatient follow-up with oncology/Dr. Delton Coombes: Last chemotherapy on 05/09/2020  Hypertension: -Blood pressure stable.  Continue Lasix, lisinopril, metoprolol and spironolactone  Anemia of chronic disease -Hemoglobin stable  Generalized deconditioning -PT recommends home health PT versus SNF.  Consult Education officer, museum.  DVT prophylaxis: Eliquis Code Status: DNR Family Communication: None at bedside Disposition Plan: Status is: Inpatient  Remains inpatient appropriate because:Inpatient level of care appropriate due to severity of illness   Dispo: The patient is from: Home              Anticipated d/c is to: Home              Anticipated d/c date is: 2 days              Patient currently is not medically stable to d/c.  Grossly volume overloaded requiring intravenous Lasix  Consultants: Cardiology  Procedures: None  Antimicrobials: None   Subjective: Patient seen and examined at bedside.  Still feels weak and intermittently short of breath.  Denies chest pain or fever. Objective: Vitals:   05/19/20 2016 05/20/20 0700 05/20/20 2040 05/21/20 0509  BP: (!) 96/57  (!) 95/58 96/67  Pulse: 72  73 80  Resp: 18  16 16   Temp: 98.3 F (36.8 C)  98.1 F (36.7 C) 97.8 F (36.6 C)  TempSrc: Oral     SpO2: 100%  100% 100%  Weight:  63.8 kg    Height:        Intake/Output Summary (Last 24 hours) at 05/21/2020 5284 Last data filed at 05/21/2020  0400 Gross per 24 hour  Intake 480 ml  Output 2400 ml  Net -1920 ml   Filed Weights   05/17/20 0534 05/19/20 0500 05/20/20 0700  Weight: 63.5 kg 64.2 kg 63.8 kg    Examination:  General exam: Chronically ill looking.  No acute distress.  Looks older than stated age  respiratory system: Bilateral decreased breath  sounds at bases with basilar crackles.  Cardiovascular system: S1-S2 heard, rate controlled Gastrointestinal system: Abdomen is nondistended, soft and nontender.  Normal bowel sounds heard  extremities: Bilateral lower extremity 2-3+ pitting edema still present no clubbing genitourinary: Bilateral nephrostomy tubes present draining clear urine  Central nervous system: Alert and oriented.  No focal neurological deficits.  Moves extremities  skin: No obvious ulcers/rashes Psychiatry: Extremely anxious  Data Reviewed: I have personally reviewed following labs and imaging studies  CBC: Recent Labs  Lab 05/16/20 1324 05/18/20 0628 05/21/20 0644  WBC 10.4 10.6* 10.8*  NEUTROABS 7.6 8.2* 8.4*  HGB 11.5* 11.4* 10.9*  HCT 37.3* 35.7* 34.9*  MCV 86.1 85.4 86.4  PLT 192 185 841*   Basic Metabolic Panel: Recent Labs  Lab 05/17/20 0225 05/18/20 0628 05/19/20 0729 05/20/20 0616 05/21/20 0644  NA 126* 126* 124* 123* 122*  K 4.9 4.4 3.8 4.0 4.2  CL 91* 89* 87* 86* 85*  CO2 20* 26 26 28 27   GLUCOSE 100* 106* 98 79 79  BUN 33* 35* 29* 26* 25*  CREATININE 0.90 1.03 0.91 0.87 0.78  CALCIUM 8.3* 8.2* 7.7* 7.6* 7.9*  MG  --  2.0 1.8 1.8 1.9   GFR: Estimated Creatinine Clearance: 74 mL/min (by C-G formula based on SCr of 0.78 mg/dL). Liver Function Tests: Recent Labs  Lab 05/16/20 2212 05/18/20 0628  AST 45* 42*  ALT 27 31  ALKPHOS 704* 660*  BILITOT 1.5* 1.2  PROT 6.9 6.0*  ALBUMIN 3.2* 2.9*   No results for input(s): LIPASE, AMYLASE in the last 168 hours. No results for input(s): AMMONIA in the last 168 hours. Coagulation Profile: No results for input(s): INR, PROTIME in the last 168 hours. Cardiac Enzymes: No results for input(s): CKTOTAL, CKMB, CKMBINDEX, TROPONINI in the last 168 hours. BNP (last 3 results) No results for input(s): PROBNP in the last 8760 hours. HbA1C: No results for input(s): HGBA1C in the last 72 hours. CBG: No results for input(s): GLUCAP in the  last 168 hours. Lipid Profile: No results for input(s): CHOL, HDL, LDLCALC, TRIG, CHOLHDL, LDLDIRECT in the last 72 hours. Thyroid Function Tests: No results for input(s): TSH, T4TOTAL, FREET4, T3FREE, THYROIDAB in the last 72 hours. Anemia Panel: No results for input(s): VITAMINB12, FOLATE, FERRITIN, TIBC, IRON, RETICCTPCT in the last 72 hours. Sepsis Labs: No results for input(s): PROCALCITON, LATICACIDVEN in the last 168 hours.  Recent Results (from the past 240 hour(s))  SARS Coronavirus 2 by RT PCR (hospital order, performed in [facility] hospital lab) Nasopharyngeal Nasopharyngeal Swab     Status: None   Collection Time: 05/16/20  9:42 PM   Specimen: Nasopharyngeal Swab  Result Value Ref Range Status   SARS Coronavirus 2 NEGATIVE NEGATIVE Final    Comment: (NOTE) SARS-CoV-2 target nucleic acids are NOT DETECTED.  The SARS-CoV-2 RNA is generally detectable in upper and lower respiratory specimens during the acute phase of infection. The lowest concentration of SARS-CoV-2 viral copies this assay can detect is 250 copies / mL. A negative result does not preclude SARS-CoV-2 infection and should not be used as the sole basis for treatment  or other patient management decisions.  A negative result may occur with improper specimen collection / handling, submission of specimen other than nasopharyngeal swab, presence of viral mutation(s) within the areas targeted by this assay, and inadequate number of viral copies (<250 copies / mL). A negative result must be combined with clinical observations, patient history, and epidemiological information.  Fact Sheet for Patients:   StrictlyIdeas.no  Fact Sheet for Healthcare Providers: BankingDealers.co.za  This test is not yet approved or  cleared by the [place] FDA and has been authorized for detection and/or diagnosis of SARS-CoV-2 by FDA under an Emergency Use Authorization (EUA).   This EUA will remain in effect (meaning this test can be used) for the duration of the COVID-19 declaration under Section 564(b)(1) of the Act, 21 U.S.C. section 360bbb-3(b)(1), unless the authorization is terminated or revoked sooner.  Performed at [facility], [address] [place], [place] [address]          Radiology Studies: No results found.      Scheduled Meds: . apixaban  5 mg Oral BID  . Chlorhexidine Gluconate Cloth  6 each Topical Daily  . furosemide  80 mg Intravenous TID  . lisinopril  5 mg Oral Daily  . metoprolol succinate  50 mg Oral Daily  . spironolactone  12.5 mg Oral Daily   Continuous Infusions:        Aline August, MD [facility] 05/21/2020, 8:38 AM

## 2020-05-22 ENCOUNTER — Telehealth: Payer: Medicare Other | Admitting: Family Medicine

## 2020-05-22 ENCOUNTER — Ambulatory Visit (HOSPITAL_COMMUNITY): Payer: Medicare Other

## 2020-05-22 LAB — CBC WITH DIFFERENTIAL/PLATELET
Abs Immature Granulocytes: 0.08 10*3/uL — ABNORMAL HIGH (ref 0.00–0.07)
Basophils Absolute: 0 10*3/uL (ref 0.0–0.1)
Basophils Relative: 1 %
Eosinophils Absolute: 0.2 10*3/uL (ref 0.0–0.5)
Eosinophils Relative: 2 %
HCT: 33.2 % — ABNORMAL LOW (ref 39.0–52.0)
Hemoglobin: 10.8 g/dL — ABNORMAL LOW (ref 13.0–17.0)
Immature Granulocytes: 1 %
Lymphocytes Relative: 11 %
Lymphs Abs: 1 10*3/uL (ref 0.7–4.0)
MCH: 27.9 pg (ref 26.0–34.0)
MCHC: 32.5 g/dL (ref 30.0–36.0)
MCV: 85.8 fL (ref 80.0–100.0)
Monocytes Absolute: 0.9 10*3/uL (ref 0.1–1.0)
Monocytes Relative: 10 %
Neutro Abs: 6.7 10*3/uL (ref 1.7–7.7)
Neutrophils Relative %: 75 %
Platelets: 103 10*3/uL — ABNORMAL LOW (ref 150–400)
RBC: 3.87 MIL/uL — ABNORMAL LOW (ref 4.22–5.81)
RDW: 23.8 % — ABNORMAL HIGH (ref 11.5–15.5)
WBC: 8.9 10*3/uL (ref 4.0–10.5)
nRBC: 0 % (ref 0.0–0.2)

## 2020-05-22 LAB — BASIC METABOLIC PANEL
Anion gap: 13 (ref 5–15)
BUN: 26 mg/dL — ABNORMAL HIGH (ref 8–23)
CO2: 24 mmol/L (ref 22–32)
Calcium: 8 mg/dL — ABNORMAL LOW (ref 8.9–10.3)
Chloride: 86 mmol/L — ABNORMAL LOW (ref 98–111)
Creatinine, Ser: 0.7 mg/dL (ref 0.61–1.24)
GFR calc Af Amer: >60 mL/min (ref >60)
GFR calc non Af Amer: >60 mL/min (ref >60)
Glucose, Bld: 107 mg/dL — ABNORMAL HIGH (ref 70–99)
Potassium: 4.1 mmol/L (ref 3.5–5.1)
Sodium: 123 mmol/L — ABNORMAL LOW (ref 135–145)

## 2020-05-22 LAB — MAGNESIUM: Magnesium: 1.7 mg/dL (ref 1.7–2.4)

## 2020-05-22 LAB — SODIUM: Sodium: 123 mmol/L — ABNORMAL LOW (ref 135–145)

## 2020-05-22 NOTE — Progress Notes (Signed)
PROGRESS NOTE  [name] BPZ:[id number] DOB: Feb 23, 1951 DOA: 05/16/2020 PCP: [name], MD   LOS: 6 days   Brief Narrative / Interim history: 69 year old male with chronic combined CHF, metastatic urothelial cancer status post bilateral nephrostomy tubes, aortic valve replacement, hypertension, PAD, history of PE on anticoagulation, recurrent hospitalizations for CHF exacerbation/UTI/hyponatremia came in with difficulties breathing and leg swelling.  On admission BNP was elevated, chest x-ray showed right pleural effusion.  Cardiology was consulted, he was placed on IV Lasix  Subjective / 24h Interval events: Complains of persistent swelling in his legs and shortness of breath.  Does not appreciate much improvement  Assessment & Plan: Principal Problem Acute on chronic combined systolic and diastolic CHF -patient remains volume overloaded with lower extremity edema as well as bibasilar crackles.  Repeat echo done at the beginning of August 13 Has an EF of 20 to 25%.  Continue IV Lasix.  Active Problems Hyponatremia, acute on chronic-sodium has been in the mid 120s for quite some time.  Dr. Remi Haggard discussed with nephrology and patient was started on tolvaptan, his lisinopril and spironolactone have been held.  Continue tolvaptan, sodium 123  CAD with history of stenting-chest pain-free, continue metoprolol  History of AVR-recent echo showed moderate paravalvular leak involving the valve and valve TAVR.  Managed conservatively  History of bilateral PEs-continue Eliquis  Metastatic urothelial carcinoma status post bilateral nephrostomy tubes for hydronephrosis-last chemotherapy on 05/09/2020, outpatient follow-up with Dr. Delton Coombes  Essential hypertension-blood pressure stable on IV Lasix  Anemia of chronic disease-hemoglobin stable  Generalized deconditioning-PT following   Scheduled Meds: . apixaban  5 mg Oral BID  . Chlorhexidine Gluconate Cloth  6 each Topical  Daily  . furosemide  80 mg Intravenous TID  . metoprolol succinate  50 mg Oral Daily  . tolvaptan  15 mg Oral Q24H   Continuous Infusions: PRN Meds:.acetaminophen, methocarbamol, morphine injection, ondansetron (ZOFRAN) IV, oxyCODONE, sodium chloride flush  Diet Orders (From admission, onward)    Start     Ordered   05/17/20 1311  Diet Heart Room service appropriate? Yes; Fluid consistency: Thin; Fluid restriction: 1200 mL Fluid  Diet effective now       Question Answer Comment  Room service appropriate? Yes   Fluid consistency: Thin   Fluid restriction: 1200 mL Fluid      05/17/20 1310           DVT prophylaxis:  apixaban (ELIQUIS) tablet 5 mg     Code Status: DNR  Family Communication: no family at bedside   Status is: Inpatient  Remains inpatient appropriate because:Inpatient level of care appropriate due to severity of illness   Dispo: The patient is from: Home              Anticipated d/c is to: Home              Anticipated d/c date is: > 3 days              Patient currently is not medically stable to d/c.   Consultants:  Cardiology   Procedures:  None   Microbiology  None   Antimicrobials: None     Objective: Vitals:   05/21/20 1400 05/21/20 2052 05/22/20 0500 05/22/20 0536  BP: 114/62 97/69  96/63  Pulse: 83 79  86  Resp: 13 16  18   Temp: 98.7 F (37.1 C) 97.9 F (36.6 C)  98.3 F (36.8 C)  TempSrc:  Oral    SpO2: 97% 99%  100%  Weight:   62.1 kg   Height:        Intake/Output Summary (Last 24 hours) at 05/22/2020 1142 Last data filed at 05/22/2020 1010 Gross per 24 hour  Intake 240 ml  Output 3715 ml  Net -3475 ml   Filed Weights   05/19/20 0500 05/20/20 0700 05/22/20 0500  Weight: 64.2 kg 63.8 kg 62.1 kg    Examination:  Constitutional: NAD Eyes: no scleral icterus ENMT: Mucous membranes are moist.  Neck: normal, supple Respiratory: Diffuse bibasilar crackles, no wheezing.  Normal respiratory effort Cardiovascular: Regular  rate and rhythm, holosystolic murmur.  2+ pitting LE edema. Good peripheral pulses Abdomen: non distended, no tenderness. Bowel sounds positive.  Musculoskeletal: no clubbing / cyanosis.  Skin: no rashes Neurologic: CN 2-12 grossly intact. Strength 5/5 in all 4.    Data Reviewed: I have independently reviewed following labs and imaging studies   CBC: Recent Labs  Lab 05/16/20 1324 05/18/20 0628 05/21/20 0644 05/22/20 0411  WBC 10.4 10.6* 10.8* 8.9  NEUTROABS 7.6 8.2* 8.4* 6.7  HGB 11.5* 11.4* 10.9* 10.8*  HCT 37.3* 35.7* 34.9* 33.2*  MCV 86.1 85.4 86.4 85.8  PLT 192 185 115* 694*   Basic Metabolic Panel: Recent Labs  Lab 05/18/20 0628 05/18/20 0628 05/19/20 0729 05/19/20 0729 05/20/20 0616 05/21/20 0644 05/21/20 1216 05/21/20 1909 05/22/20 0411  NA 126*   < > 124*   < > 123* 122* 121* 124* 123*  K 4.4   < > 3.8  --  4.0 4.2 3.8  --  4.1  CL 89*   < > 87*  --  86* 85* 85*  --  86*  CO2 26   < > 26  --  28 27 27   --  24  GLUCOSE 106*   < > 98  --  79 79 145*  --  107*  BUN 35*   < > 29*  --  26* 25* 24*  --  26*  CREATININE 1.03   < > 0.91  --  0.87 0.78 0.71  --  0.70  CALCIUM 8.2*   < > 7.7*  --  7.6* 7.9* 7.5*  --  8.0*  MG 2.0  --  1.8  --  1.8 1.9  --   --  1.7   < > = values in this interval not displayed.   Liver Function Tests: Recent Labs  Lab 05/16/20 2212 05/18/20 0628  AST 45* 42*  ALT 27 31  ALKPHOS 704* 660*  BILITOT 1.5* 1.2  PROT 6.9 6.0*  ALBUMIN 3.2* 2.9*   Coagulation Profile: No results for input(s): INR, PROTIME in the last 168 hours. HbA1C: No results for input(s): HGBA1C in the last 72 hours. CBG: No results for input(s): GLUCAP in the last 168 hours.  Recent Results (from the past 240 hour(s))  SARS Coronavirus 2 by RT PCR (hospital order, performed in [facility] hospital lab) Nasopharyngeal Nasopharyngeal Swab     Status: None   Collection Time: 05/16/20  9:42 PM   Specimen: Nasopharyngeal Swab  Result Value Ref Range Status    SARS Coronavirus 2 NEGATIVE NEGATIVE Final    Comment: (NOTE) SARS-CoV-2 target nucleic acids are NOT DETECTED.  The SARS-CoV-2 RNA is generally detectable in upper and lower respiratory specimens during the acute phase of infection. The lowest concentration of SARS-CoV-2 viral copies this assay can detect is 250 copies / mL. A negative result does not preclude SARS-CoV-2 infection and should not be used  as the sole basis for treatment or other patient management decisions.  A negative result may occur with improper specimen collection / handling, submission of specimen other than nasopharyngeal swab, presence of viral mutation(s) within the areas targeted by this assay, and inadequate number of viral copies (<250 copies / mL). A negative result must be combined with clinical observations, patient history, and epidemiological information.  Fact Sheet for Patients:   StrictlyIdeas.no  Fact Sheet for Healthcare Providers: BankingDealers.co.za  This test is not yet approved or  cleared by the [place] FDA and has been authorized for detection and/or diagnosis of SARS-CoV-2 by FDA under an Emergency Use Authorization (EUA).  This EUA will remain in effect (meaning this test can be used) for the duration of the COVID-19 declaration under Section 564(b)(1) of the Act, 21 U.S.C. section 360bbb-3(b)(1), unless the authorization is terminated or revoked sooner.  Performed at [facility], [address] [place], [place] [address]      Radiology Studies: No results found.   Marzetta Board, MD, PhD [facility]  Between 7 am - 7 pm I am available, please contact me via Amion or Securechat  Between 7 pm - 7 am I am not available, please contact night coverage MD/APP via Amion

## 2020-05-22 NOTE — Progress Notes (Signed)
Progress Note  Patient Name: [name] Date of Encounter: 05/22/2020  [facility] Cardiologist: Rozann Lesches, MD  Subjective   Stable SOB  Inpatient Medications    Scheduled Meds: . apixaban  5 mg Oral BID  . Chlorhexidine Gluconate Cloth  6 each Topical Daily  . furosemide  80 mg Intravenous TID  . metoprolol succinate  50 mg Oral Daily  . tolvaptan  15 mg Oral Q24H   Continuous Infusions:  PRN Meds: acetaminophen, methocarbamol, morphine injection, ondansetron (ZOFRAN) IV, oxyCODONE, sodium chloride flush   Vital Signs    Vitals:   05/21/20 1400 05/21/20 2052 05/22/20 0500 05/22/20 0536  BP: 114/62 97/69  96/63  Pulse: 83 79  86  Resp: 13 16  18   Temp: 98.7 F (37.1 C) 97.9 F (36.6 C)  98.3 F (36.8 C)  TempSrc:  Oral    SpO2: 97% 99%  100%  Weight:   62.1 kg   Height:        Intake/Output Summary (Last 24 hours) at 05/22/2020 0748 Last data filed at 05/21/2020 2345 Gross per 24 hour  Intake --  Output 3975 ml  Net -3975 ml   Last 3 Weights 05/22/2020 05/20/2020 05/19/2020  Weight (lbs) 136 lb 14.4 oz 140 lb 10.5 oz 141 lb 8.6 oz  Weight (kg) 62.097 kg 63.8 kg 64.2 kg      Telemetry    SR - Personally Reviewed  ECG    n/a - Personally Reviewed  Physical Exam   GEN: No acute distress.   Neck: No JVD Cardiac: RRR, 2/6 systolic murmur rusb, rubs, or gallops.  Respiratory: Clear to auscultation bilaterally. GI: Soft, nontender, non-distended  MS: 2+ bilatearal LE edema; No deformity. Neuro:  Nonfocal  Psych: Normal affect   Labs    High Sensitivity Troponin:   Recent Labs  Lab 05/16/20 2212 05/17/20 0225  TROPONINIHS 34* 61*      Chemistry Recent Labs  Lab 05/16/20 2212 05/17/20 0225 05/18/20 1540 05/19/20 0729 05/21/20 0644 05/21/20 0644 05/21/20 1216 05/21/20 1909 05/22/20 0411  NA  --    < > 126*   < > 122*   < > 121* 124* 123*  K  --    < > 4.4   < > 4.2  --  3.8  --  4.1  CL  --    < > 89*   < > 85*  --  85*   --  86*  CO2  --    < > 26   < > 27  --  27  --  24  GLUCOSE  --    < > 106*   < > 79  --  145*  --  107*  BUN  --    < > 35*   < > 25*  --  24*  --  26*  CREATININE  --    < > 1.03   < > 0.78  --  0.71  --  0.70  CALCIUM  --    < > 8.2*   < > 7.9*  --  7.5*  --  8.0*  PROT 6.9  --  6.0*  --   --   --   --   --   --   ALBUMIN 3.2*  --  2.9*  --   --   --   --   --   --   AST 45*  --  42*  --   --   --   --   --   --  ALT 27  --  31  --   --   --   --   --   --   ALKPHOS 704*  --  660*  --   --   --   --   --   --   BILITOT 1.5*  --  1.2  --   --   --   --   --   --   GFRNONAA  --    < > >60   < > >60  --  >60  --  >60  GFRAA  --    < > >60   < > >60  --  >60  --  >60  ANIONGAP  --    < > 11   < > 10  --  9  --  13   < > = values in this interval not displayed.     Hematology Recent Labs  Lab 05/18/20 0628 05/21/20 0644 05/22/20 0411  WBC 10.6* 10.8* 8.9  RBC 4.18* 4.04* 3.87*  HGB 11.4* 10.9* 10.8*  HCT 35.7* 34.9* 33.2*  MCV 85.4 86.4 85.8  MCH 27.3 27.0 27.9  MCHC 31.9 31.2 32.5  RDW 25.1* 23.9* 23.8*  PLT 185 115* 103*    BNP Recent Labs  Lab 05/16/20 1324  BNP >4,500.0*     DDimer No results for input(s): DDIMER in the last 168 hours.   Radiology    No results found.  Cardiac Studies     Patient Profile     [name] a 69 y.o.malewith past medical history of CAD (s/p BMS to RCA in 2014), AVR(s/p bioprosthetic AVR and ultimately valve in valve TAVR in 09/2017 due to torncoronary cusp of his previously placed bioprosthetic AVR), chronic combined systolic and diastolic CHF (EF 25-95% by echo in 12/2019, at 20-25% by repeat echo in 04/2020),PAD (s/p left SFA intervention in 10/2018), HTN, HLD, recently diagnosed bilateral PE, metastatic urothelial cancer(diagnosed in 10/2019), chronic anemia and history of DVTwho is being seen today for the evaluation ofCHFat the request of Dr. Starla Link.  Assessment & Plan    1. Acute on chronic combined  systolic/diastolic HF - 02/3874 echo LVEF 25-30%, severe RV dysfunction, severe BAE, mild to mod MR,  - 12/2019 echo LVEF 45-50% -BNP greater than 4500. CXR shows a mild right pleural effusion with associated atelectasis - seen by Dr Domenic Polite on initial consultation who is also his regular cardiologist,due to multiple comorbidities including metstastic urothelial cancer and bilateral PEs no plans for cath at this time.   Incomplete I/Os, this admission, documented 3.9L of uop yesterday. He is on IV lasix 80mg  tid,. Downtrend in Cr with diuresis consistent with venous congestion and CHF.    - medical therapy with toprol 50. With hyponatremia aldactone and lisinopril held per renal.   - continue IV diuretics today  2. History of AVR - mod paravalvular leak on 04/2020 echo  3. History of PE - he is on eliquis  4. Hyponatremia - started on tolvaptan by primary team - sodium uptrending. Continue diuretics at this time - renal recommended stopping aldactone and lisinopril at this time.   For questions or updates, please contact [place] Please consult [email or web address] for contact info under        Signed, Carlyle Dolly, MD  05/22/2020, 7:48 AM

## 2020-05-23 ENCOUNTER — Ambulatory Visit: Payer: Medicare Other | Admitting: Urology

## 2020-05-23 LAB — CBC
HCT: 36.1 % — ABNORMAL LOW (ref 39.0–52.0)
Hemoglobin: 11.3 g/dL — ABNORMAL LOW (ref 13.0–17.0)
MCH: 27.2 pg (ref 26.0–34.0)
MCHC: 31.3 g/dL (ref 30.0–36.0)
MCV: 86.8 fL (ref 80.0–100.0)
Platelets: 108 10*3/uL — ABNORMAL LOW (ref 150–400)
RBC: 4.16 MIL/uL — ABNORMAL LOW (ref 4.22–5.81)
RDW: 23.5 % — ABNORMAL HIGH (ref 11.5–15.5)
WBC: 8.8 10*3/uL (ref 4.0–10.5)
nRBC: 0 % (ref 0.0–0.2)

## 2020-05-23 LAB — COMPREHENSIVE METABOLIC PANEL
ALT: 18 U/L (ref 0–44)
AST: 28 U/L (ref 15–41)
Albumin: 2.9 g/dL — ABNORMAL LOW (ref 3.5–5.0)
Alkaline Phosphatase: 588 U/L — ABNORMAL HIGH (ref 38–126)
Anion gap: 14 (ref 5–15)
BUN: 25 mg/dL — ABNORMAL HIGH (ref 8–23)
CO2: 27 mmol/L (ref 22–32)
Calcium: 8.4 mg/dL — ABNORMAL LOW (ref 8.9–10.3)
Chloride: 88 mmol/L — ABNORMAL LOW (ref 98–111)
Creatinine, Ser: 0.75 mg/dL (ref 0.61–1.24)
GFR calc Af Amer: >60 mL/min (ref >60)
GFR calc non Af Amer: >60 mL/min (ref >60)
Glucose, Bld: 76 mg/dL (ref 70–99)
Potassium: 3.8 mmol/L (ref 3.5–5.1)
Sodium: 129 mmol/L — ABNORMAL LOW (ref 135–145)
Total Bilirubin: 1.6 mg/dL — ABNORMAL HIGH (ref 0.3–1.2)
Total Protein: 6.6 g/dL (ref 6.5–8.1)

## 2020-05-23 LAB — MAGNESIUM: Magnesium: 1.8 mg/dL (ref 1.7–2.4)

## 2020-05-23 NOTE — Care Management Important Message (Signed)
Important Message  Patient Details  Name: [name] MRN: [id number] Date of Birth: 1951-05-18   Medicare Important Message Given:  Yes     [name] 05/23/2020, 10:15 AM

## 2020-05-23 NOTE — Progress Notes (Signed)
Physical Therapy Treatment Patient Details Name: [name] MRN: [id number] DOB: 10-28-1950 Today's Date: 05/23/2020    History of Present Illness 69 y.o. male  with past medical history of urothelial cancer with mets to bones- last seen by Dr. Delton Coombes on 8/19 with plans to start [place], CAD, AVR with TAVR 09/2017, CHF with EF 20-25% on most recent echo, HTN, HLD, chronic bilateral PE  admitted on 05/16/2020 with CHF exacerbation. Palliative medicine consulted for goals of care.    PT Comments    Patient is making good progress with PT.  Patient ambulated w/ RW without O2, slow labored gait requiring 4 standing rest breaks to return O2 sats to above 90%. Patient did not complain of dizziness, SOB or dyspnea on exertion. Heart rate max was 100 bpm during ambulation.      Follow Up Recommendations  Home health PT;SNF;Supervision for mobility/OOB;Supervision - Intermittent     Equipment Recommendations  None recommended by PT    Recommendations for Other Services       Precautions / Restrictions Precautions Precautions: Fall Precaution Comments: bil nephrostomy drains Restrictions Weight Bearing Restrictions: No    Mobility  Bed Mobility               General bed mobility comments: patient in recliner at beginning and end of session.  Transfers   Equipment used: Rolling walker (2 wheeled) Transfers: Sit to/from [employer] to Stand: Supervision Stand pivot transfers: Supervision          Ambulation/Gait Ambulation/Gait assistance: Supervision Gait Distance (Feet): 400 Feet Assistive device: Rolling walker (2 wheeled) Gait Pattern/deviations: Step-through pattern;Trunk flexed;Decreased step length - right;Decreased step length - left;Decreased stride length Gait velocity: decreased   General Gait Details: ambulated w/ RW without O2, slow labored gait requiring 4 standing rest breaks to return O2 sats to above 90%. Patient did not  complain of dizziness, SOB or dyspnea on exertion. Heart rate max was 100 bpm during ambulation.   Stairs             Wheelchair Mobility    Modified Rankin (Stroke Patients Only)       Balance Overall balance assessment: Needs assistance Sitting-balance support: No upper extremity supported;Feet supported Sitting balance-Leahy Scale: Good Sitting balance - Comments: seated EOB   Standing balance support: Bilateral upper extremity supported;During functional activity Standing balance-Leahy Scale: Poor Standing balance comment: reliant on RW            Cognition Arousal/Alertness: Awake/alert Behavior During Therapy: WFL for tasks assessed/performed Overall Cognitive Status: Within Functional Limits for tasks assessed     Exercises General Exercises - Upper Extremity Shoulder Flexion: Self ROM;AROM;Strengthening;Both;10 reps;Seated General Exercises - Lower Extremity Long Arc Quad: AROM;Strengthening;Both;10 reps;Seated Hip Flexion/Marching: AROM;Strengthening;Both;10 reps;Seated Toe Raises: AROM;Strengthening;Both;10 reps;Seated Heel Raises: AROM;Strengthening;Both;10 reps;Seated    General Comments        Pertinent Vitals/Pain Pain Assessment: 0-10 Pain Score: 7  Pain Location: all over depending on time. Pain Intervention(s): Limited activity within patient's tolerance;Monitored during session           PT Goals (current goals can now be found in the care plan section) Acute Rehab PT Goals Patient Stated Goal: Go to rehab unless he can have home O2 as he "looses (his) wind" when trying to get out of bed and walk. PT Goal Formulation: With patient Time For Goal Achievement: 06/03/20 Potential to Achieve Goals: Fair    Frequency    Min 3X/week  PT Plan Current plan remains appropriate       AM-PAC PT "6 Clicks" Mobility   Outcome Measure  Help needed turning from your back to your side while in a flat bed without using bedrails?:  None Help needed moving from lying on your back to sitting on the side of a flat bed without using bedrails?: None Help needed moving to and from a bed to a chair (including a wheelchair)?: None Help needed standing up from a chair using your arms (e.g., wheelchair or bedside chair)?: A Little Help needed to walk in hospital room?: A Little Help needed climbing 3-5 steps with a railing? : A Lot 6 Click Score: 20    End of Session   Activity Tolerance: Patient tolerated treatment well Patient left: with call bell/phone within reach;in chair Nurse Communication: Mobility status PT Visit Diagnosis: Unsteadiness on feet (R26.81);Other abnormalities of gait and mobility (R26.89);Muscle weakness (generalized) (M62.81)     Time: 5537-4827 PT Time Calculation (min) (ACUTE ONLY): 30 min  Charges:  $Gait Training: 8-22 mins $Therapeutic Exercise: 8-22 mins                     Floria Raveling. Hartnett-Rands, MS, PT Per [place] [phone] 05/23/2020, 1:50 PM

## 2020-05-23 NOTE — Progress Notes (Addendum)
Progress Note  Patient Name: YADRIEL KERRIGAN Date of Encounter: 05/23/2020  [facility] Cardiologist: Rozann Lesches, MD   Subjective   Breathing is improving.   Inpatient Medications    Scheduled Meds: . apixaban  5 mg Oral BID  . Chlorhexidine Gluconate Cloth  6 each Topical Daily  . furosemide  80 mg Intravenous TID  . metoprolol succinate  50 mg Oral Daily  . tolvaptan  15 mg Oral Q24H   Continuous Infusions:  PRN Meds: acetaminophen, methocarbamol, morphine injection, ondansetron (ZOFRAN) IV, oxyCODONE, sodium chloride flush   Vital Signs    Vitals:   05/22/20 0536 05/22/20 1521 05/22/20 2214 05/23/20 0500  BP: 96/63 111/65 115/72   Pulse: 86 81 87   Resp: 18 17 20    Temp: 98.3 F (36.8 C)  98.6 F (37 C)   TempSrc:   Oral   SpO2: 100% 98% 100%   Weight:    56.7 kg  Height:        Intake/Output Summary (Last 24 hours) at 05/23/2020 0747 Last data filed at 05/23/2020 0500 Gross per 24 hour  Intake 960 ml  Output 6065 ml  Net -5105 ml   Last 3 Weights 05/23/2020 05/22/2020 05/20/2020  Weight (lbs) 125 lb 136 lb 14.4 oz 140 lb 10.5 oz  Weight (kg) 56.7 kg 62.097 kg 63.8 kg      Telemetry    SR - Personally Reviewed  ECG    n/a - Personally Reviewed  Physical Exam   GEN: No acute distress.   Neck: No JVD Cardiac: RRR, no murmurs, rubs, or gallops.  Respiratory: Clear to auscultation bilaterally. GI: Soft, nontender, non-distended  MS: No edema; No deformity. Neuro:  Nonfocal  Psych: Normal affect   Labs    High Sensitivity Troponin:   Recent Labs  Lab 05/16/20 2212 05/17/20 0225  TROPONINIHS 34* 61*      Chemistry Recent Labs  Lab 05/16/20 2212 05/17/20 0225 05/18/20 0628 05/19/20 0729 05/21/20 1216 05/21/20 1909 05/22/20 0411 05/22/20 1145 05/23/20 0532  NA  --    < > 126*   < > 121*   < > 123* 123* 129*  K  --    < > 4.4   < > 3.8  --  4.1  --  3.8  CL  --    < > 89*   < > 85*  --  86*  --  88*  CO2  --    < > 26   < >  27  --  24  --  27  GLUCOSE  --    < > 106*   < > 145*  --  107*  --  76  BUN  --    < > 35*   < > 24*  --  26*  --  25*  CREATININE  --    < > 1.03   < > 0.71  --  0.70  --  0.75  CALCIUM  --    < > 8.2*   < > 7.5*  --  8.0*  --  8.4*  PROT 6.9  --  6.0*  --   --   --   --   --  6.6  ALBUMIN 3.2*  --  2.9*  --   --   --   --   --  2.9*  AST 45*  --  42*  --   --   --   --   --  28  ALT 27  --  31  --   --   --   --   --  18  ALKPHOS 704*  --  660*  --   --   --   --   --  588*  BILITOT 1.5*  --  1.2  --   --   --   --   --  1.6*  GFRNONAA  --    < > >60   < > >60  --  >60  --  >60  GFRAA  --    < > >60   < > >60  --  >60  --  >60  ANIONGAP  --    < > 11   < > 9  --  13  --  14   < > = values in this interval not displayed.     Hematology Recent Labs  Lab 05/21/20 0644 05/22/20 0411 05/23/20 0532  WBC 10.8* 8.9 8.8  RBC 4.04* 3.87* 4.16*  HGB 10.9* 10.8* 11.3*  HCT 34.9* 33.2* 36.1*  MCV 86.4 85.8 86.8  MCH 27.0 27.9 27.2  MCHC 31.2 32.5 31.3  RDW 23.9* 23.8* 23.5*  PLT 115* 103* 108*    BNP Recent Labs  Lab 05/16/20 1324  BNP >4,500.0*     DDimer No results for input(s): DDIMER in the last 168 hours.   Radiology    No results found.  Cardiac Studies    Patient Profile     Remmy Riffe Martinis a 69 y.o.malewith past medical history of CAD (s/p BMS to RCA in 2014), AVR(s/p bioprosthetic AVR and ultimately valve in valve TAVR in 09/2017 due to torncoronary cusp of his previously placed bioprosthetic AVR), chronic combined systolic and diastolic CHF (EF 23-76% by echo in 12/2019, at 20-25% by repeat echo in 04/2020),PAD (s/p left SFA intervention in 10/2018), HTN, HLD, recently diagnosed bilateral PE, metastatic urothelial cancer(diagnosed in 10/2019), chronic anemia and history of DVTwho is being seen today for the evaluation ofCHFat the request of Dr. Starla Link.  Assessment & Plan    1. Acute on chronic combined systolic/diastolic HF - 10/8313 echo LVEF  25-30%, severe RV dysfunction, severe BAE, mild to mod MR,  - 12/2019 echo LVEF 45-50% -BNP greater than 4500. CXR shows a mild right pleural effusion with associated atelectasis - seen by Dr Domenic Polite on initial consultation who is also his regular cardiologist,due to multiple comorbidities including metstastic urothelial cancer and bilateral PEs no plans for cath at this time.   Incomplete I/Os,this admission,documented6L of uop yesterday.He is on IV lasix 80mg  tid,. Renal function overlall stable   - medical therapy with toprol 50. With hyponatremia aldactone and lisinopril held per renal.  - remaisn voluem overloaded, continue IV lasix today  2. History of AVR - mod paravalvular leak on 04/2020 echo  3. History of PE - he is on eliquis  4. Hyponatremia - started on tolvaptan by primary team - sodium uptrending. Continue diuretics at this time - renal recommended stopping aldactone and lisinopril at this time.   6. Deconditioning - consider PT consult   For questions or updates, please contact [place] Please consult [email or web address] for contact info under        Signed, Carlyle Dolly, MD  05/23/2020, 7:47 AM

## 2020-05-23 NOTE — Progress Notes (Signed)
PROGRESS NOTE  [name] PTW:[id number] DOB: 05/09/1951 DOA: 05/16/2020 PCP: [name], MD   LOS: 7 days   Brief Narrative / Interim history: 69 year old male with chronic combined CHF, metastatic urothelial cancer status post bilateral nephrostomy tubes, aortic valve replacement, hypertension, PAD, history of PE on anticoagulation, recurrent hospitalizations for CHF exacerbation/UTI/hyponatremia came in with difficulties breathing and leg swelling.  On admission BNP was elevated, chest x-ray showed right pleural effusion.  Cardiology was consulted, he was placed on IV Lasix  Subjective / 24h Interval events: He has been walking more, still still short of breath.  Still appreciates lower extremity swelling.  Assessment & Plan: Principal Problem Acute on chronic combined systolic and diastolic CHF -he remains volume overloaded with 2+ pitting lower extremity edema as well as bibasilar crackles. -Repeat 2D echo done in the beginning of August showed an EF of 20-35% -He is net -17.6 L, weight is down from about 140 pounds to 125. -Renal function is stable, continue IV Lasix   Active Problems Hyponatremia, acute on chronic - sodium has been in the mid 120s for quite some time.  Dr. Remi Haggard discussed with nephrology and patient was started on tolvaptan, his lisinopril and spironolactone have been held.  Continue tolvaptan, sodium is now improved to 129  CAD with history of stenting - chest pain-free, continue metoprolol  History of AVR - recent echo showed moderate paravalvular leak involving the valve and valve TAVR.  Managed conservatively  History of bilateral PEs - continue Eliquis  Metastatic urothelial carcinoma status post bilateral nephrostomy tubes for hydronephrosis - last chemotherapy on 05/09/2020, outpatient follow-up with Dr. Delton Coombes  Essential hypertension-blood pressure stable on IV Lasix  Anemia of chronic disease-hemoglobin stable  Generalized  deconditioning-PT following, will need home health PT on discharge   Scheduled Meds:  apixaban  5 mg Oral BID   Chlorhexidine Gluconate Cloth  6 each Topical Daily   furosemide  80 mg Intravenous TID   metoprolol succinate  50 mg Oral Daily   tolvaptan  15 mg Oral Q24H   Continuous Infusions: PRN Meds:.acetaminophen, methocarbamol, morphine injection, ondansetron (ZOFRAN) IV, oxyCODONE, sodium chloride flush  Diet Orders (From admission, onward)    Start     Ordered   05/17/20 1311  Diet Heart Room service appropriate? Yes; Fluid consistency: Thin; Fluid restriction: 1200 mL Fluid  Diet effective now       Question Answer Comment  Room service appropriate? Yes   Fluid consistency: Thin   Fluid restriction: 1200 mL Fluid      05/17/20 1310           DVT prophylaxis:  apixaban (ELIQUIS) tablet 5 mg     Code Status: DNR  Family Communication: no family at bedside   Status is: Inpatient  Remains inpatient appropriate because:Inpatient level of care appropriate due to severity of illness  Dispo: The patient is from: Home              Anticipated d/c is to: Home              Anticipated d/c date is: > 3 days              Patient currently is not medically stable to d/c.  Consultants:  Cardiology   Procedures:  None   Microbiology  None   Antimicrobials: None     Objective: Vitals:   05/22/20 1521 05/22/20 2214 05/23/20 0500 05/23/20 0858  BP: 111/65 115/72  129/79  Pulse: 81  87  (!) 103  Resp: 17 20  18   Temp:  98.6 F (37 C)  97.8 F (36.6 C)  TempSrc:  Oral  Oral  SpO2: 98% 100%  93%  Weight:   56.7 kg   Height:        Intake/Output Summary (Last 24 hours) at 05/23/2020 1001 Last data filed at 05/23/2020 0500 Gross per 24 hour  Intake 960 ml  Output 5950 ml  Net -4990 ml   Filed Weights   05/20/20 0700 05/22/20 0500 05/23/20 0500  Weight: 63.8 kg 62.1 kg 56.7 kg    Examination:  Constitutional: no distress in bed Eyes: No scleral  icterus ENMT: Moist mucous membranes Neck: normal, supple Respiratory: Diffuse bibasilar crackles, no wheezing, normal respiratory effort Cardiovascular: Regular rate and rhythm, holosystolic murmur.  2+ pitting lower extremity edema.  Good peripheral pulses Abdomen: Soft, nontender, nondistended, bowel sounds positive Musculoskeletal: no clubbing / cyanosis.  Skin: No rashes appreciated Neurologic: Nonfocal, equal strength   Data Reviewed: I have independently reviewed following labs and imaging studies   CBC: Recent Labs  Lab 05/16/20 1324 05/18/20 0628 05/21/20 0644 05/22/20 0411 05/23/20 0532  WBC 10.4 10.6* 10.8* 8.9 8.8  NEUTROABS 7.6 8.2* 8.4* 6.7  --   HGB 11.5* 11.4* 10.9* 10.8* 11.3*  HCT 37.3* 35.7* 34.9* 33.2* 36.1*  MCV 86.1 85.4 86.4 85.8 86.8  PLT 192 185 115* 103* 537*   Basic Metabolic Panel: Recent Labs  Lab 05/19/20 0729 05/19/20 0729 05/20/20 0616 05/20/20 0616 05/21/20 0644 05/21/20 0644 05/21/20 1216 05/21/20 1909 05/22/20 0411 05/22/20 1145 05/23/20 0532  NA 124*   < > 123*   < > 122*   < > 121* 124* 123* 123* 129*  K 3.8   < > 4.0  --  4.2  --  3.8  --  4.1  --  3.8  CL 87*   < > 86*  --  85*  --  85*  --  86*  --  88*  CO2 26   < > 28  --  27  --  27  --  24  --  27  GLUCOSE 98   < > 79  --  79  --  145*  --  107*  --  76  BUN 29*   < > 26*  --  25*  --  24*  --  26*  --  25*  CREATININE 0.91   < > 0.87  --  0.78  --  0.71  --  0.70  --  0.75  CALCIUM 7.7*   < > 7.6*  --  7.9*  --  7.5*  --  8.0*  --  8.4*  MG 1.8  --  1.8  --  1.9  --   --   --  1.7  --  1.8   < > = values in this interval not displayed.   Liver Function Tests: Recent Labs  Lab 05/16/20 2212 05/18/20 0628 05/23/20 0532  AST 45* 42* 28  ALT 27 31 18   ALKPHOS 704* 660* 588*  BILITOT 1.5* 1.2 1.6*  PROT 6.9 6.0* 6.6  ALBUMIN 3.2* 2.9* 2.9*   Coagulation Profile: No results for input(s): INR, PROTIME in the last 168 hours. HbA1C: No results for input(s):  HGBA1C in the last 72 hours. CBG: No results for input(s): GLUCAP in the last 168 hours.  Recent Results (from the past 240 hour(s))  SARS Coronavirus 2 by RT PCR (hospital order,  performed in [facility] hospital lab) Nasopharyngeal Nasopharyngeal Swab     Status: None   Collection Time: 05/16/20  9:42 PM   Specimen: Nasopharyngeal Swab  Result Value Ref Range Status   SARS Coronavirus 2 NEGATIVE NEGATIVE Final    Comment: (NOTE) SARS-CoV-2 target nucleic acids are NOT DETECTED.  The SARS-CoV-2 RNA is generally detectable in upper and lower respiratory specimens during the acute phase of infection. The lowest concentration of SARS-CoV-2 viral copies this assay can detect is 250 copies / mL. A negative result does not preclude SARS-CoV-2 infection and should not be used as the sole basis for treatment or other patient management decisions.  A negative result may occur with improper specimen collection / handling, submission of specimen other than nasopharyngeal swab, presence of viral mutation(s) within the areas targeted by this assay, and inadequate number of viral copies (<250 copies / mL). A negative result must be combined with clinical observations, patient history, and epidemiological information.  Fact Sheet for Patients:   StrictlyIdeas.no  Fact Sheet for Healthcare Providers: BankingDealers.co.za  This test is not yet approved or  cleared by the [place] FDA and has been authorized for detection and/or diagnosis of SARS-CoV-2 by FDA under an Emergency Use Authorization (EUA).  This EUA will remain in effect (meaning this test can be used) for the duration of the COVID-19 declaration under Section 564(b)(1) of the Act, 21 U.S.C. section 360bbb-3(b)(1), unless the authorization is terminated or revoked sooner.  Performed at [facility], [address], [place], [place] [address]      Radiology Studies: No results  found.   Marzetta Board, MD, PhD [facility]  Between 7 am - 7 pm I am available, please contact me via Amion or Securechat  Between 7 pm - 7 am I am not available, please contact night coverage MD/APP via Amion

## 2020-05-23 NOTE — TOC Progression Note (Signed)
Transition of Care [facility]) - Progression Note   Patient Details  Name: [name] MRN: [id number] Date of Birth: [birth year]  Transition of Care [facility]) CM/SW [place], LCSW Phone Number: 05/23/2020, 3:28 PM  Clinical Narrative: CSW followed up with patient's sister, Alejandro Mulling, and provided update on patient's services that will be set up at discharge [facility] and OP palliative). TOC to follow.  Expected Discharge Plan: [place] Barriers to Discharge: Continued Medical Work up  Expected Discharge Plan and Services Expected Discharge Plan: [place] In-house Referral: Clinical Social Work Post Acute Care Choice: [place] arrangements for the past 2 months: [place] Arranged: RN, PT [facility] Agency: [place] Date [place]: 05/17/20 Time [place]: [phone] Representative spoke with at [place]: Ashley Murrain  Readmission Risk Interventions Readmission Risk Prevention Plan 05/17/2020 04/22/2020 03/07/2020  Transportation Screening Complete Complete Complete  PCP or Specialist Appt within 3-5 Days - - Not Complete  Not Complete comments - - Assessment completed during second shift  [place] or [place] - - Complete  Social Work Consult for [place] Planning/Counseling - - Complete  Palliative Care Screening - - Not Applicable  Medication Review Press photographer) Complete Complete Complete  PCP or Specialist appointment within 3-5 days of discharge - Complete -  [place] or Home Care Consult Complete Complete -  SW Recovery Care/Counseling Consult Complete Complete -  Palliative Care Screening Not Applicable Complete -  [place] Not Applicable Not Applicable -  Some recent data might be hidden

## 2020-05-23 NOTE — Progress Notes (Signed)
.   Pharmacist Chemotherapy Monitoring - Initial Assessment    Anticipated start date: 05/28/20   Regimen:  . Are orders appropriate based on the patient's diagnosis, regimen, and cycle? Yes . Does the plan date match the patient's scheduled date? Yes . Is the sequencing of drugs appropriate? Yes . Are the premedications appropriate for the patient's regimen? Yes . Prior Authorization for treatment is: Approved o If applicable, is the correct biosimilar selected based on the patient's insurance? not applicable  Organ Function and Labs: [name] Are dose adjustments needed based on the patient's renal function, hepatic function, or hematologic function? No . Are appropriate labs ordered prior to the start of patient's treatment? Yes . Other organ system assessment, if indicated: N/A . The following baseline labs, if indicated, have been ordered: pembrolizumab: baseline TSH +/- T4  Dose Assessment: . Are the drug doses appropriate? Yes . Are the following correct: o Drug concentrations Yes o IV fluid compatible with drug Yes o Administration routes Yes o Timing of therapy Yes . If applicable, does the patient have documented access for treatment and/or plans for port-a-cath placement? not applicable . If applicable, have lifetime cumulative doses been properly documented and assessed? not applicable Lifetime Dose Tracking  . Carboplatin: 800 mg = 0.01 % of the maximum lifetime dose of 999,999,999 mg  o   Toxicity Monitoring/Prevention: . The patient has the following take home antiemetics prescribed: Prochlorperazine . The patient has the following take home medications prescribed: N/A . Medication allergies and previous infusion related reactions, if applicable, have been reviewed and addressed. No . The patient's current medication list has been assessed for drug-drug interactions with their chemotherapy regimen. no significant drug-drug interactions were identified on review.  Order  Review: . Are the treatment plan orders signed? No . Is the patient scheduled to see a provider prior to their treatment? Yes  I verify that I have reviewed each item in the above checklist and answered each question accordingly.  Wynona Neat 05/23/2020 4:01 PM

## 2020-05-24 LAB — COMPREHENSIVE METABOLIC PANEL
ALT: 21 U/L (ref 0–44)
AST: 31 U/L (ref 15–41)
Albumin: 2.8 g/dL — ABNORMAL LOW (ref 3.5–5.0)
Alkaline Phosphatase: 577 U/L — ABNORMAL HIGH (ref 38–126)
Anion gap: 13 (ref 5–15)
BUN: 19 mg/dL (ref 8–23)
CO2: 27 mmol/L (ref 22–32)
Calcium: 8.4 mg/dL — ABNORMAL LOW (ref 8.9–10.3)
Chloride: 88 mmol/L — ABNORMAL LOW (ref 98–111)
Creatinine, Ser: 0.65 mg/dL (ref 0.61–1.24)
GFR calc Af Amer: >60 mL/min (ref >60)
GFR calc non Af Amer: >60 mL/min (ref >60)
Glucose, Bld: 91 mg/dL (ref 70–99)
Potassium: 4 mmol/L (ref 3.5–5.1)
Sodium: 128 mmol/L — ABNORMAL LOW (ref 135–145)
Total Bilirubin: 1.5 mg/dL — ABNORMAL HIGH (ref 0.3–1.2)
Total Protein: 6.2 g/dL — ABNORMAL LOW (ref 6.5–8.1)

## 2020-05-24 LAB — BASIC METABOLIC PANEL
Anion gap: 13 (ref 5–15)
BUN: 19 mg/dL (ref 8–23)
CO2: 27 mmol/L (ref 22–32)
Calcium: 8.4 mg/dL — ABNORMAL LOW (ref 8.9–10.3)
Chloride: 88 mmol/L — ABNORMAL LOW (ref 98–111)
Creatinine, Ser: 0.67 mg/dL (ref 0.61–1.24)
GFR calc Af Amer: >60 mL/min (ref >60)
GFR calc non Af Amer: >60 mL/min (ref >60)
Glucose, Bld: 88 mg/dL (ref 70–99)
Potassium: 4 mmol/L (ref 3.5–5.1)
Sodium: 128 mmol/L — ABNORMAL LOW (ref 135–145)

## 2020-05-24 LAB — CBC
HCT: 34.7 % — ABNORMAL LOW (ref 39.0–52.0)
Hemoglobin: 11.1 g/dL — ABNORMAL LOW (ref 13.0–17.0)
MCH: 27.4 pg (ref 26.0–34.0)
MCHC: 32 g/dL (ref 30.0–36.0)
MCV: 85.7 fL (ref 80.0–100.0)
Platelets: 113 10*3/uL — ABNORMAL LOW (ref 150–400)
RBC: 4.05 MIL/uL — ABNORMAL LOW (ref 4.22–5.81)
RDW: 23 % — ABNORMAL HIGH (ref 11.5–15.5)
WBC: 9 10*3/uL (ref 4.0–10.5)
nRBC: 0 % (ref 0.0–0.2)

## 2020-05-24 LAB — MAGNESIUM
Magnesium: 1.8 mg/dL (ref 1.7–2.4)
Magnesium: 1.8 mg/dL (ref 1.7–2.4)

## 2020-05-24 NOTE — Progress Notes (Addendum)
Progress Note  Patient Name: [name] Date of Encounter: 05/24/2020  Primary Cardiologist: Rozann Lesches, MD   Subjective   Breathing improved since admission but not yet at baseline. Says he feels uncomfortable if not on supplemental oxygen. Orthopnea at night. No chest pain or palpitations.   Inpatient Medications    Scheduled Meds: . apixaban  5 mg Oral BID  . Chlorhexidine Gluconate Cloth  6 each Topical Daily  . furosemide  80 mg Intravenous TID  . metoprolol succinate  50 mg Oral Daily  . tolvaptan  15 mg Oral Q24H   Continuous Infusions:  PRN Meds: acetaminophen, methocarbamol, morphine injection, ondansetron (ZOFRAN) IV, oxyCODONE, sodium chloride flush   Vital Signs    Vitals:   05/23/20 1450 05/23/20 2016 05/24/20 0511 05/24/20 0600  BP: 109/62 117/68 121/70   Pulse: 85 94 89   Resp: _0 Temp: 98.5 F (36.9 C) 98 F (36.7 C) 97.9 F (36.6 C)   TempSrc: Oral     SpO2: 100% 100% 98%   Weight:    56.6 kg  Height:        Intake/Output Summary (Last 24 hours) at 05/24/2020 0819 Last data filed at 05/24/2020 0510 Gross per 24 hour  Intake 240 ml  Output 3805 ml  Net -3565 ml    Last 3 Weights 05/24/2020 05/23/2020 05/22/2020  Weight (lbs) 124 lb 12.5 oz 125 lb 136 lb 14.4 oz  Weight (kg) 56.6 kg 56.7 kg 62.097 kg      Telemetry    NSR, HR in 70's to 80's.  - Personally Reviewed  ECG    No new tracings.   Physical Exam   General: Thin male appearing in no acute distress. Head: Normocephalic, atraumatic.  Neck: Supple without bruits, JVD not elevated. Lungs:  Resp regular and unlabored, mild rales along bases. Heart: RRR, S1, S2, no S3, S4, 2/6 SEM along RUSB.  Abdomen: Soft, non-tender, non-distended with normoactive bowel sounds. No hepatomegaly. No rebound/guarding. No obvious abdominal masses. Extremities: No clubbing or cyanosis. 1+ pitting edema along LLE, trace edema along RLE. Distal pedal pulses are 2+ bilaterally. Neuro:  Alert and oriented X 3. Moves all extremities spontaneously. Psych: Normal affect.  Labs    Chemistry Recent Labs  Lab 05/18/20 [phone] 05/19/20 0729 05/21/20 1216 05/21/20 1909 05/22/20 0411 05/22/20 1145 05/23/20 0532  NA 126*   < > 121*   < > 123* 123* 129*  K 4.4   < > 3.8  --  4.1  --  3.8  CL 89*   < > 85*  --  86*  --  88*  CO2 26   < > 27  --  24  --  27  GLUCOSE 106*   < > 145*  --  107*  --  76  BUN 35*   < > 24*  --  26*  --  25*  CREATININE 1.03   < > 0.71  --  0.70  --  0.75  CALCIUM 8.2*   < > 7.5*  --  8.0*  --  8.4*  PROT 6.0*  --   --   --   --   --  6.6  ALBUMIN 2.9*  --   --   --   --   --  2.9*  AST 42*  --   --   --   --   --  28  ALT 31  --   --   --   --   --  18  ALKPHOS 660*  --   --   --   --   --  588*  BILITOT 1.2  --   --   --   --   --  1.6*  GFRNONAA >60   < > >60  --  >60  --  >60  GFRAA >60   < > >60  --  >60  --  >60  ANIONGAP 11   < > 9  --  13  --  14   < > = values in this interval not displayed.     Hematology Recent Labs  Lab 05/21/20 0644 05/22/20 0411 05/23/20 0532  WBC 10.8* 8.9 8.8  RBC 4.04* 3.87* 4.16*  HGB 10.9* 10.8* 11.3*  HCT 34.9* 33.2* 36.1*  MCV 86.4 85.8 86.8  MCH 27.0 27.9 27.2  MCHC 31.2 32.5 31.3  RDW 23.9* 23.8* 23.5*  PLT 115* 103* 108*    Cardiac EnzymesNo results for input(s): TROPONINI in the last 168 hours. No results for input(s): TROPIPOC in the last 168 hours.   BNPNo results for input(s): BNP, PROBNP in the last 168 hours.   DDimer No results for input(s): DDIMER in the last 168 hours.   Radiology    No results found.  Cardiac Studies   Echocardiogram: 04/23/2020 IMPRESSIONS    1. Left ventricular ejection fraction, by estimation, is 25 to 30%. The  left ventricle has severely decreased function. The left ventricle  demonstrates global hypokinesis. The left ventricular internal cavity size  was moderately dilated. There is mild  left ventricular hypertrophy. Left ventricular diastolic  parameters are  indeterminate.  2. Right ventricular systolic function is severely reduced. The right  ventricular size is mildly enlarged. There is moderately elevated  pulmonary artery systolic pressure. The estimated right ventricular  systolic pressure is 61.4 mmHg.  3. Left atrial size was severely dilated.  4. Right atrial size was severely dilated.  5. The mitral valve is abnormal. Mild to moderate mitral valve  regurgitation.  6. The aortic valve has been repaired/replaced. Aortic valve  regurgitation is trivial. There is a 26 mm Edwards Sapien prosthetic  (TAVR) valve present in the aortic position (patient has valve in valve  TAVR with previous bioprosthetic AVR). Aortic valve  mean gradient measures 8.0 mmHg. There is a moderate paravalvular leak at  9 o'clock position.  7. The inferior vena cava is dilated in size with <50% respiratory  variability, suggesting right atrial pressure of 15 mmHg.   Patient Profile     69 y.o. male w/ PMH of CAD (s/p BMS to RCA in 2014), AVR (s/p bioprosthetic AVR and ultimately valve in valve TAVR in 09/2017 due to torncoronary cusp of his previously placed bioprosthetic AVR), chronic combined systolic and diastolic CHF (EF 43-15% by echo in 12/2019, at 25-30% by repeat echo in 04/2020), PAD (s/p left SFA intervention in 10/2018), HTN, HLD, recently diagnosed bilateral PE, metastatic urothelial cancer (diagnosed in 10/2019), chronic anemia and history of DVT who is currently admitted for a CHF exacerbation.   Assessment & Plan    1. Acute on Chronic Combined Systolic and Diastolic CHF - He has a known reduced EF of 25-30% and presented with worsening respiratory symptoms and edema. BNP > 4500 and CXR consistent with CHF.  - He has been receiving IV Lasix 63m TID with a recorded output of -21.2 L this admission (-3.6 L within 24 hours). Weight has declined from 141 lbs to 124 lbs which is lower  than his prior baseline but his appetite has  decreased over the past few months so suspect this is playing a role as well. Creatinine stable at 0.75 on 9/2 with repeat labs pending for this AM. Given his orthopnea and edema, would continue with IV Lasix today. Was previously on Lasix 60m daily earlier this year but listed as 131mdaily prior to admission. Suspect he will at least require 4052mID at the time of discharge. Will ask for ambulatory oxygen saturations to be obtained today.  - Remains on Toprol-XL. PTA Spironolactone and Lisinopril currently held given his hyponatremia.   2. CAD - He is s/p BMS to RCA in 2014. No recent chest pain. Continue BB therapy. No ASA given the need for anticoagulation and no recent intervention. Statin currently held given elevated LFT's.   3. AVR - He is s/p bioprosthetic AVR and ultimately valve in valve TAVR in 09/2017 due to torncoronary cusp of his previously placed bioprosthetic AVR. Repeat echo in 04/2020 showed moderate perivavular leak as outlined above.   4. PAD - He is s/p left SFA intervention in 10/2018. No longer on ASA given the need for anticoagulation. Statin therapy held this admission given elevated LFT's (AST and ALT normalized by labs on 9/2 with Alk Phos elevated to 588).   5. Bilateral PE - Continue Eliquis 5mg36mD for anticoagulation. Hgb 10.8 and platelets 103 on 05/22/2020.  6. Metastatic urothelial cancer  - Followed by Oncology as an outpatient. Undergoing Chemotherapy.   7. Hyponatremia - Chronic by review of prior labs. As low as 121 this admission, improved to 129 on 9/2 following initiation of Tolvaptan. Repeat labs pending for today. SpirArlyce Harman Lisinopril currently held per renal recommendations.    For questions or updates, please contact [facility] consult [email or web address] for contact info under Cardiology/STEMI.   SignArna MediciA-C 8:19 AM 05/24/2020 Pager: [phone] note  Patient seen and discussed with PA StraAhmed Prima agree with her documentation. Admitted with acute on chronic systolic HF. I/Os have been incomplete this admission. From yesterday reported neg 3.6 L. He is on IV lasix 80mg36m, labs are pending today but renal functino has been stable. Some issues with hyponatremia that are chronic but worsened during admit, started on talvaptan by primary team with improving sodiums. Renal had recommended stopping his aldactone and lisinopril due to hyponatremia. Pending sodiums at discharge would consider restarting low dose lisinopril. At discharge would plan for torsemide 40mg 57m would need close f/u and labs.  From PT note some ambulatory hypoxia yesterday, at rest does fairly well on just room air. Would continue IV diuresis. With his advanced heart failure and multiple advanced comorbidities worry about bounce back admission if not euvolemic at discharge, essentialyl has been admitted once a month since May.   JonathCarlyle Dolly

## 2020-05-24 NOTE — Progress Notes (Addendum)
Physical Therapy Treatment Patient Details Name: [name] MRN: [id number] DOB: 1951/04/02 Today's Date: 05/24/2020    History of Present Illness 69 y.o. male  with past medical history of urothelial cancer with mets to bones- last seen by Dr. Delton Coombes on 8/19 with plans to start [place], CAD, AVR with TAVR 09/2017, CHF with EF 20-25% on most recent echo, HTN, HLD, chronic bilateral PE  admitted on 05/16/2020 with CHF exacerbation. Palliative medicine consulted for goals of care.    PT Comments    The patient performed supine to sit with supervision. The patient's O2 sat sitting EOB was 85% on 2LPM [place] at the start of today's session. Patient tolerated sitting EOB without LOB. O2 improved to 90% and patient performed sit to stand min guard and proceeded to ambulate 200 feet with supervision using RW with O2 sat remaining at 82%. He tolerated therapeutic exercises without SOB on 1LPM with O2 sat at 100%. Patient tolerated sitting up in chair after therapy- RN notified. PLAN: The patient will continue to benefit from skilled physical therapy services in hospital at recommended venue below in order to improve balance, gait, and ADL's to promote independence in functional activities.     Follow Up Recommendations  Home health PT;Supervision for mobility/OOB     Equipment Recommendations  None recommended by PT    Recommendations for Other Services       Precautions / Restrictions Precautions Precautions: Fall Precaution Comments: bil nephrostomy drains Restrictions Weight Bearing Restrictions: No    Mobility  Bed Mobility Overal bed mobility: Needs Assistance Bed Mobility: Supine to Sit     Supine to sit: Supervision Sit to supine: Supervision      Transfers Overall transfer level: Needs assistance Equipment used: Rolling walker (2 wheeled) Transfers: Sit to/from Stand Sit to Stand: Min guard         General transfer comment: slow, labored movement without LOB or  SOB  Ambulation/Gait Ambulation/Gait assistance: Min guard;Supervision Gait Distance (Feet): 200 Feet Assistive device: Rolling walker (2 wheeled) Gait Pattern/deviations: Decreased stride length;Step-through pattern Gait velocity: decreased   General Gait Details: ambulated w/ RW on 2LPM, required 1 rest break due to SOB- resolved within 30 seconds   Stairs             Wheelchair Mobility    Modified Rankin (Stroke Patients Only)       Balance Overall balance assessment: Needs assistance Sitting-balance support: No upper extremity supported;Feet supported Sitting balance-Leahy Scale: Good Sitting balance - Comments: seated EOB   Standing balance support: Bilateral upper extremity supported;During functional activity Standing balance-Leahy Scale: Good Standing balance comment: improved standing balance in RW, able to take hands off handles to adjust gown                            Cognition Arousal/Alertness: Awake/alert Behavior During Therapy: WFL for tasks assessed/performed Overall Cognitive Status: Within Functional Limits for tasks assessed                                        Exercises General Exercises - Lower Extremity Long Arc Quad: AROM;Strengthening;Both;10 reps;Seated Hip Flexion/Marching: AROM;Strengthening;Both;10 reps;Seated Toe Raises: AROM;Strengthening;Both;10 reps;Seated Heel Raises: AROM;Strengthening;Both;10 reps;Seated    General Comments        Pertinent Vitals/Pain Pain Assessment: No/denies pain    Home Living  Prior Function            PT Goals (current goals can now be found in the care plan section) Acute Rehab PT Goals Patient Stated Goal: go home as long as he can obtain O2 for household use PT Goal Formulation: With patient Time For Goal Achievement: 06/03/20 Potential to Achieve Goals: Good Progress towards PT goals: Progressing toward goals     Frequency    Min 3X/week      PT Plan Current plan remains appropriate    Co-evaluation              AM-PAC PT "6 Clicks" Mobility   Outcome Measure  Help needed turning from your back to your side while in a flat bed without using bedrails?: None Help needed moving from lying on your back to sitting on the side of a flat bed without using bedrails?: None Help needed moving to and from a bed to a chair (including a wheelchair)?: None Help needed standing up from a chair using your arms (e.g., wheelchair or bedside chair)?: A Little Help needed to walk in hospital room?: A Little Help needed climbing 3-5 steps with a railing? : A Lot 6 Click Score: 20    End of Session Equipment Utilized During Treatment: Gait belt;Oxygen Activity Tolerance: Patient tolerated treatment well Patient left: with call bell/phone within reach;in chair Nurse Communication: Mobility status PT Visit Diagnosis: Unsteadiness on feet (R26.81);Other abnormalities of gait and mobility (R26.89);Muscle weakness (generalized) (M62.81)     Time: 7517-0017 PT Time Calculation (min) (ACUTE ONLY): 22 min  Charges:  $Gait Training: 8-22 mins $Therapeutic Exercise: 8-22 mins                     2:26 PM , 05/24/20 Karlyn Agee, SPT Physical Therapy with [facility] [phone] office  During this treatment session, the therapist was present, participating in and directing the treatment.  2:26 PM, 05/24/20 Lonell Grandchild, MPT Physical Therapist with [facility] [phone] office [phone] mobile phone

## 2020-05-24 NOTE — Progress Notes (Signed)
PROGRESS NOTE  [name] JQG:[id number] DOB: 25-Jun-1951 DOA: 05/16/2020 PCP: [name], MD   LOS: 8 days   Brief Narrative / Interim history: 69 year old male with chronic combined CHF, metastatic urothelial cancer status post bilateral nephrostomy tubes, aortic valve replacement, hypertension, PAD, history of PE on anticoagulation, recurrent hospitalizations for CHF exacerbation/UTI/hyponatremia came in with difficulties breathing and leg swelling.  On admission BNP was elevated, chest x-ray showed right pleural effusion.  Cardiology was consulted, he was placed on IV Lasix  Subjective / 24h Interval events: Overall better, he is able to walk in the hallway however feels quite short of breath. He really wants oxygen for home. Legs are still swollen. He denies any chest pain  Assessment & Plan: Principal Problem Acute on chronic combined systolic and diastolic CHF -he remains volume overloaded today, continues to have 2+ pitting lower extremity edema as well as bibasilar crackles, although overall improved -Repeat 2D echo done in the beginning of August showed an EF of 20-35% -He is net -21.2 L -Renal function is stable, continue IV Lasix, anticipate he will need IV diuresis for the next day or so and then eventually transition to torsemide per cardiology   Active Problems Hyponatremia, acute on chronic - sodium has been in the mid 120s for quite some time.  Dr. Remi Haggard discussed with nephrology and patient was started on tolvaptan, his lisinopril and spironolactone have been held.  Continue tolvaptan, sodium is now improved. Will hold spironolactone on discharge and resume only lisinopril  CAD with history of stenting - chest pain-free, continue metoprolol  History of AVR - recent echo showed moderate paravalvular leak involving the valve and valve TAVR.  Managed conservatively  History of bilateral PEs - continue Eliquis  Metastatic urothelial carcinoma status post  bilateral nephrostomy tubes for hydronephrosis - last chemotherapy on 05/09/2020, outpatient follow-up with Dr. Delton Coombes  Essential hypertension-blood pressure remained stable on IV Lasix  Anemia of chronic disease-hemoglobin stable  Generalized deconditioning-PT following, will need home health PT on discharge   Scheduled Meds:  apixaban  5 mg Oral BID   Chlorhexidine Gluconate Cloth  6 each Topical Daily   furosemide  80 mg Intravenous TID   metoprolol succinate  50 mg Oral Daily   tolvaptan  15 mg Oral Q24H   Continuous Infusions: PRN Meds:.acetaminophen, methocarbamol, morphine injection, ondansetron (ZOFRAN) IV, oxyCODONE, sodium chloride flush  Diet Orders (From admission, onward)    Start     Ordered   05/17/20 1311  Diet Heart Room service appropriate? Yes; Fluid consistency: Thin; Fluid restriction: 1200 mL Fluid  Diet effective now       Question Answer Comment  Room service appropriate? Yes   Fluid consistency: Thin   Fluid restriction: 1200 mL Fluid      05/17/20 1310           DVT prophylaxis:  apixaban (ELIQUIS) tablet 5 mg     Code Status: DNR  Family Communication: no family at bedside   Status is: Inpatient  Remains inpatient appropriate because:Inpatient level of care appropriate due to severity of illness  Dispo: The patient is from: Home              Anticipated d/c is to: Home              Anticipated d/c date is: 2 days              Patient currently is not medically stable to d/c.  Consultants:  Cardiology  Procedures:  None   Microbiology  None   Antimicrobials: None     Objective: Vitals:   05/23/20 1450 05/23/20 2016 05/24/20 0511 05/24/20 0600  BP: 109/62 117/68 121/70   Pulse: 85 94 89   Resp: 18 20 18    Temp: 98.5 F (36.9 C) 98 F (36.7 C) 97.9 F (36.6 C)   TempSrc: Oral     SpO2: 100% 100% 98%   Weight:    56.6 kg  Height:        Intake/Output Summary (Last 24 hours) at 05/24/2020 1134 Last data filed  at 05/24/2020 0959 Gross per 24 hour  Intake 240 ml  Output 3600 ml  Net -3360 ml   Filed Weights   05/22/20 0500 05/23/20 0500 05/24/20 0600  Weight: 62.1 kg 56.7 kg 56.6 kg    Examination:  Constitutional: NAD, in bed about to eat breakfast Eyes: No scleral icterus ENMT: Moist mucous membranes Neck: normal, supple Respiratory: Faint bibasilar crackles, no wheezing, normal respiratory effort Cardiovascular: Regular rate and rhythm, holosystolic murmur. 2+ pitting edema.  Good peripheral pulses Abdomen: Soft, NT, ND, bowel sounds positive Musculoskeletal: no clubbing / cyanosis.  Skin: No rashes appreciated Neurologic: No focal deficits, ambulatory   Data Reviewed: I have independently reviewed following labs and imaging studies   CBC: Recent Labs  Lab 05/18/20 0628 05/21/20 0644 05/22/20 0411 05/23/20 0532 05/24/20 0842  WBC 10.6* 10.8* 8.9 8.8 9.0  NEUTROABS 8.2* 8.4* 6.7  --   --   HGB 11.4* 10.9* 10.8* 11.3* 11.1*  HCT 35.7* 34.9* 33.2* 36.1* 34.7*  MCV 85.4 86.4 85.8 86.8 85.7  PLT 185 115* 103* 108* 431*   Basic Metabolic Panel: Recent Labs  Lab 05/20/20 0616 05/20/20 0616 05/21/20 0644 05/21/20 0644 05/21/20 1216 05/21/20 1216 05/21/20 1909 05/22/20 0411 05/22/20 1145 05/23/20 0532 05/24/20 0842  NA 123*   < > 122*   < > 121*   < > 124* 123* 123* 129* 128*   128*  K 4.0   < > 4.2  --  3.8  --   --  4.1  --  3.8 4.0   4.0  CL 86*   < > 85*  --  85*  --   --  86*  --  88* 88*   88*  CO2 28   < > 27  --  27  --   --  24  --  27 27   27   GLUCOSE 79   < > 79  --  145*  --   --  107*  --  76 91   88  BUN 26*   < > 25*  --  24*  --   --  26*  --  25* 19   19  CREATININE 0.87   < > 0.78  --  0.71  --   --  0.70  --  0.75 0.65   0.67  CALCIUM 7.6*   < > 7.9*  --  7.5*  --   --  8.0*  --  8.4* 8.4*   8.4*  MG 1.8  --  1.9  --   --   --   --  1.7  --  1.8 1.8   1.8   < > = values in this interval not displayed.   Liver Function Tests: Recent Labs  Lab  05/18/20 0628 05/23/20 0532 05/24/20 0842  AST 42* 28 31  ALT 31 18 21   ALKPHOS 660* 588* 577*  BILITOT 1.2 1.6* 1.5*  PROT 6.0* 6.6 6.2*  ALBUMIN 2.9* 2.9* 2.8*   Coagulation Profile: No results for input(s): INR, PROTIME in the last 168 hours. HbA1C: No results for input(s): HGBA1C in the last 72 hours. CBG: No results for input(s): GLUCAP in the last 168 hours.  Recent Results (from the past 240 hour(s))  SARS Coronavirus 2 by RT PCR (hospital order, performed in [facility] hospital lab) Nasopharyngeal Nasopharyngeal Swab     Status: None   Collection Time: 05/16/20  9:42 PM   Specimen: Nasopharyngeal Swab  Result Value Ref Range Status   SARS Coronavirus 2 NEGATIVE NEGATIVE Final    Comment: (NOTE) SARS-CoV-2 target nucleic acids are NOT DETECTED.  The SARS-CoV-2 RNA is generally detectable in upper and lower respiratory specimens during the acute phase of infection. The lowest concentration of SARS-CoV-2 viral copies this assay can detect is 250 copies / mL. A negative result does not preclude SARS-CoV-2 infection and should not be used as the sole basis for treatment or other patient management decisions.  A negative result may occur with improper specimen collection / handling, submission of specimen other than nasopharyngeal swab, presence of viral mutation(s) within the areas targeted by this assay, and inadequate number of viral copies (<250 copies / mL). A negative result must be combined with clinical observations, patient history, and epidemiological information.  Fact Sheet for Patients:   StrictlyIdeas.no  Fact Sheet for Healthcare Providers: BankingDealers.co.za  This test is not yet approved or  cleared by the [place] FDA and has been authorized for detection and/or diagnosis of SARS-CoV-2 by FDA under an Emergency Use Authorization (EUA).  This EUA will remain in effect (meaning this test can be  used) for the duration of the COVID-19 declaration under Section 564(b)(1) of the Act, 21 U.S.C. section 360bbb-3(b)(1), unless the authorization is terminated or revoked sooner.  Performed at [facility], [address], [place], [place] [address]      Radiology Studies: No results found.   Marzetta Board, MD, PhD [facility]  Between 7 am - 7 pm I am available, please contact me via Amion or Securechat  Between 7 pm - 7 am I am not available, please contact night coverage MD/APP via Amion

## 2020-05-24 NOTE — Care Management Important Message (Signed)
Important Message  Patient Details  Name: [name] MRN: [id number] Date of Birth: 06-07-1951   Medicare Important Message Given:  Yes     [name] 05/24/2020, 2:01 PM

## 2020-05-24 NOTE — Progress Notes (Signed)
Patient with a 10 beat run of V-tach, Dr. Cruzita Lederer notified, no orders received.

## 2020-05-24 NOTE — Progress Notes (Signed)
SATURATION QUALIFICATIONS: (This note is used to comply with regulatory documentation for home oxygen)  Patient Saturations on Room Air at Rest = 99%  Patient Saturations on Room Air while Ambulating = 94%  Patient Saturations on  Liters of oxygen while Ambulating = N/A  Please briefly explain why patient needs home oxygen: Does not require oxygen.

## 2020-05-25 LAB — CBC
HCT: 33.9 % — ABNORMAL LOW (ref 39.0–52.0)
Hemoglobin: 10.7 g/dL — ABNORMAL LOW (ref 13.0–17.0)
MCH: 27.4 pg (ref 26.0–34.0)
MCHC: 31.6 g/dL (ref 30.0–36.0)
MCV: 86.7 fL (ref 80.0–100.0)
Platelets: 138 10*3/uL — ABNORMAL LOW (ref 150–400)
RBC: 3.91 MIL/uL — ABNORMAL LOW (ref 4.22–5.81)
RDW: 23.1 % — ABNORMAL HIGH (ref 11.5–15.5)
WBC: 9.7 10*3/uL (ref 4.0–10.5)
nRBC: 0 % (ref 0.0–0.2)

## 2020-05-25 LAB — BASIC METABOLIC PANEL
Anion gap: 14 (ref 5–15)
BUN: 20 mg/dL (ref 8–23)
CO2: 26 mmol/L (ref 22–32)
Calcium: 8.5 mg/dL — ABNORMAL LOW (ref 8.9–10.3)
Chloride: 88 mmol/L — ABNORMAL LOW (ref 98–111)
Creatinine, Ser: 0.64 mg/dL (ref 0.61–1.24)
GFR calc Af Amer: >60 mL/min (ref >60)
GFR calc non Af Amer: >60 mL/min (ref >60)
Glucose, Bld: 96 mg/dL (ref 70–99)
Potassium: 3.8 mmol/L (ref 3.5–5.1)
Sodium: 128 mmol/L — ABNORMAL LOW (ref 135–145)

## 2020-05-25 LAB — TROPONIN I (HIGH SENSITIVITY)
Troponin I (High Sensitivity): 42 ng/L — ABNORMAL HIGH (ref <18)
Troponin I (High Sensitivity): 48 ng/L — ABNORMAL HIGH (ref <18)

## 2020-05-25 NOTE — Progress Notes (Signed)
PROGRESS NOTE  [name] NAT:[id number] DOB: 1951/05/23 DOA: 05/16/2020 PCP: [name], MD   LOS: 9 days   Brief Narrative / Interim history: 69 year old male with chronic combined CHF, metastatic urothelial cancer status post bilateral nephrostomy tubes, aortic valve replacement, hypertension, PAD, history of PE on anticoagulation, recurrent hospitalizations for CHF exacerbation/UTI/hyponatremia came in with difficulties breathing and leg swelling.  On admission BNP was elevated, chest x-ray showed right pleural effusion.  Cardiology was consulted, he was placed on IV Lasix  Subjective / 24h Interval events: Continues to have dyspnea, had an episode of shortness of breath this morning, panicked.  Complains of chest pain, pressure-like this morning.  Had an episode of chest pain overnight as well  Assessment & Plan: Principal Problem Acute on chronic combined systolic and diastolic CHF -he remains volume overloaded however improved, lower extremity edema is improving -Repeat 2D echo done in the beginning of August showed an EF of 20-35% -He is net - 24.2 L -Renal function is stable, continue IV Lasix if kidneys permit  Active Problems Hyponatremia, acute on chronic - sodium has been in the mid 120s for quite some time.  Dr. Remi Haggard discussed with nephrology and patient was started on tolvaptan, his lisinopril and spironolactone have been held.  Continue tolvaptan, sodium is now improved. Will hold spironolactone on discharge and resume only lisinopril  CAD with history of stenting -complaints of chest pain this morning, will cycle high-sensitivity troponin.    History of AVR - recent echo showed moderate paravalvular leak involving the valve and valve TAVR.  Managed conservatively  History of bilateral PEs - continue Eliquis  Metastatic urothelial carcinoma status post bilateral nephrostomy tubes for hydronephrosis - last chemotherapy on 05/09/2020, outpatient follow-up  with Dr. Delton Coombes  Essential hypertension-blood pressure stable on Lasix  Anemia of chronic disease-hemoglobin stable  Generalized deconditioning-PT following, will need home health PT on discharge   Scheduled Meds: . apixaban  5 mg Oral BID  . Chlorhexidine Gluconate Cloth  6 each Topical Daily  . furosemide  80 mg Intravenous TID  . metoprolol succinate  50 mg Oral Daily  . tolvaptan  15 mg Oral Q24H   Continuous Infusions: PRN Meds:.acetaminophen, methocarbamol, morphine injection, ondansetron (ZOFRAN) IV, oxyCODONE, sodium chloride flush  Diet Orders (From admission, onward)    Start     Ordered   05/17/20 1311  Diet Heart Room service appropriate? Yes; Fluid consistency: Thin; Fluid restriction: 1200 mL Fluid  Diet effective now       Question Answer Comment  Room service appropriate? Yes   Fluid consistency: Thin   Fluid restriction: 1200 mL Fluid      05/17/20 1310           DVT prophylaxis:  apixaban (ELIQUIS) tablet 5 mg     Code Status: DNR  Family Communication: no family at bedside   Status is: Inpatient  Remains inpatient appropriate because:Inpatient level of care appropriate due to severity of illness  Dispo: The patient is from: Home              Anticipated d/c is to: Home              Anticipated d/c date is: 2 days              Patient currently is not medically stable to d/c.  Consultants:  Cardiology   Procedures:  None   Microbiology  None   Antimicrobials: None     Objective: Vitals:  05/25/20 0320 05/25/20 0346 05/25/20 0504 05/25/20 1000  BP:  117/64 133/72 (!) 121/91  Pulse:  95 97 (!) 105  Resp:  20 16 20   Temp:  97.8 F (36.6 C) 97.8 F (36.6 C)   TempSrc:   Oral   SpO2:  99% 100% 100%  Weight: 55.5 kg     Height:        Intake/Output Summary (Last 24 hours) at 05/25/2020 1342 Last data filed at 05/25/2020 0900 Gross per 24 hour  Intake 680 ml  Output 2925 ml  Net -2245 ml   Filed Weights   05/23/20 0500  05/24/20 0600 05/25/20 0320  Weight: 56.7 kg 56.6 kg 55.5 kg    Examination:  Constitutional: No apparent distress Eyes: No icterus ENMT: Moist mucous membranes Neck: normal, supple Respiratory: Clear bilaterally without wheezing Cardiovascular: Regular rate and rhythm, holosystolic murmur, 2+ pitting edema Abdomen: Soft, nontender, nondistended, bowel sounds positive Musculoskeletal: no clubbing / cyanosis.  Skin: No rashes Neurologic: Nonfocal, equal strength   Data Reviewed: I have independently reviewed following labs and imaging studies   CBC: Recent Labs  Lab 05/21/20 0644 05/22/20 0411 05/23/20 0532 05/24/20 0842 05/25/20 0705  WBC 10.8* 8.9 8.8 9.0 9.7  NEUTROABS 8.4* 6.7  --   --   --   HGB 10.9* 10.8* 11.3* 11.1* 10.7*  HCT 34.9* 33.2* 36.1* 34.7* 33.9*  MCV 86.4 85.8 86.8 85.7 86.7  PLT 115* 103* 108* 113* 211*   Basic Metabolic Panel: Recent Labs  Lab 05/20/20 0616 05/20/20 0616 05/21/20 0644 05/21/20 0644 05/21/20 1216 05/21/20 1909 05/22/20 0411 05/22/20 1145 05/23/20 0532 05/24/20 0842 05/25/20 0705  NA 123*   < > 122*   < > 121*   < > 123* 123* 129* 128*  128* 128*  K 4.0   < > 4.2   < > 3.8  --  4.1  --  3.8 4.0  4.0 3.8  CL 86*   < > 85*   < > 85*  --  86*  --  88* 88*  88* 88*  CO2 28   < > 27   < > 27  --  24  --  27 27  27 26   GLUCOSE 79   < > 79   < > 145*  --  107*  --  76 91  88 96  BUN 26*   < > 25*   < > 24*  --  26*  --  25* 19  19 20   CREATININE 0.87   < > 0.78   < > 0.71  --  0.70  --  0.75 0.65  0.67 0.64  CALCIUM 7.6*   < > 7.9*   < > 7.5*  --  8.0*  --  8.4* 8.4*  8.4* 8.5*  MG 1.8  --  1.9  --   --   --  1.7  --  1.8 1.8  1.8  --    < > = values in this interval not displayed.   Liver Function Tests: Recent Labs  Lab 05/23/20 0532 05/24/20 0842  AST 28 31  ALT 18 21  ALKPHOS 588* 577*  BILITOT 1.6* 1.5*  PROT 6.6 6.2*  ALBUMIN 2.9* 2.8*   Coagulation Profile: No results for input(s): INR, PROTIME in the  last 168 hours. HbA1C: No results for input(s): HGBA1C in the last 72 hours. CBG: No results for input(s): GLUCAP in the last 168 hours.  Recent Results (from the past  240 hour(s))  SARS Coronavirus 2 by RT PCR (hospital order, performed in [facility] hospital lab) Nasopharyngeal Nasopharyngeal Swab     Status: None   Collection Time: 05/16/20  9:42 PM   Specimen: Nasopharyngeal Swab  Result Value Ref Range Status   SARS Coronavirus 2 NEGATIVE NEGATIVE Final    Comment: (NOTE) SARS-CoV-2 target nucleic acids are NOT DETECTED.  The SARS-CoV-2 RNA is generally detectable in upper and lower respiratory specimens during the acute phase of infection. The lowest concentration of SARS-CoV-2 viral copies this assay can detect is 250 copies / mL. A negative result does not preclude SARS-CoV-2 infection and should not be used as the sole basis for treatment or other patient management decisions.  A negative result may occur with improper specimen collection / handling, submission of specimen other than nasopharyngeal swab, presence of viral mutation(s) within the areas targeted by this assay, and inadequate number of viral copies (<250 copies / mL). A negative result must be combined with clinical observations, patient history, and epidemiological information.  Fact Sheet for Patients:   StrictlyIdeas.no  Fact Sheet for Healthcare Providers: BankingDealers.co.za  This test is not yet approved or  cleared by the [place] FDA and has been authorized for detection and/or diagnosis of SARS-CoV-2 by FDA under an Emergency Use Authorization (EUA).  This EUA will remain in effect (meaning this test can be used) for the duration of the COVID-19 declaration under Section 564(b)(1) of the Act, 21 U.S.C. section 360bbb-3(b)(1), unless the authorization is terminated or revoked sooner.  Performed at [facility], [address] [place],  [place] [address]      Radiology Studies: No results found.   Marzetta Board, MD, PhD [facility]  Between 7 am - 7 pm I am available, please contact me via Amion or Securechat  Between 7 pm - 7 am I am not available, please contact night coverage MD/APP via Amion

## 2020-05-25 NOTE — Plan of Care (Signed)

## 2020-05-26 ENCOUNTER — Inpatient Hospital Stay (HOSPITAL_COMMUNITY): Payer: Medicare Other

## 2020-05-26 LAB — BASIC METABOLIC PANEL
Anion gap: 14 (ref 5–15)
BUN: 34 mg/dL — ABNORMAL HIGH (ref 8–23)
CO2: 26 mmol/L (ref 22–32)
Calcium: 8.3 mg/dL — ABNORMAL LOW (ref 8.9–10.3)
Chloride: 84 mmol/L — ABNORMAL LOW (ref 98–111)
Creatinine, Ser: 0.79 mg/dL (ref 0.61–1.24)
GFR calc Af Amer: >60 mL/min (ref >60)
GFR calc non Af Amer: >60 mL/min (ref >60)
Glucose, Bld: 94 mg/dL (ref 70–99)
Potassium: 4 mmol/L (ref 3.5–5.1)
Sodium: 124 mmol/L — ABNORMAL LOW (ref 135–145)

## 2020-05-26 LAB — CBC
HCT: 36.4 % — ABNORMAL LOW (ref 39.0–52.0)
Hemoglobin: 11.6 g/dL — ABNORMAL LOW (ref 13.0–17.0)
MCH: 27.2 pg (ref 26.0–34.0)
MCHC: 31.9 g/dL (ref 30.0–36.0)
MCV: 85.4 fL (ref 80.0–100.0)
Platelets: 156 10*3/uL (ref 150–400)
RBC: 4.26 MIL/uL (ref 4.22–5.81)
RDW: 22.5 % — ABNORMAL HIGH (ref 11.5–15.5)
WBC: 9.3 10*3/uL (ref 4.0–10.5)
nRBC: 0 % (ref 0.0–0.2)

## 2020-05-26 MED ORDER — SALINE SPRAY 0.65 % NA SOLN
1.0000 | NASAL | Status: DC | PRN
  Administered 2020-05-26: 1 via NASAL
  Filled 2020-05-26: qty 44

## 2020-05-26 NOTE — Progress Notes (Signed)
PROGRESS NOTE  [name] EHU:[id number] DOB: March 18, 1951 DOA: 05/16/2020 PCP: [name], MD   LOS: 10 days   Brief Narrative / Interim history: 69 year old male with chronic combined CHF, metastatic urothelial cancer status post bilateral nephrostomy tubes, aortic valve replacement, hypertension, PAD, history of PE on anticoagulation, recurrent hospitalizations for CHF exacerbation/UTI/hyponatremia came in with difficulties breathing and leg swelling.  On admission BNP was elevated, chest x-ray showed right pleural effusion.  Cardiology was consulted, he was placed on IV Lasix  Subjective / 24h Interval events: Quite tachypneic when I entered the room, he is telling me he is having difficulties breathing.  Assessment & Plan: Principal Problem Acute on chronic combined systolic and diastolic CHF -he remains volume overloaded however improved, lower extremity edema is improving -Repeat 2D echo done in the beginning of August showed an EF of 20-35% -He is net -24 L, urine output is good, not charted overnight -Continue IV Lasix, still has lower extremity edema and layering right-sided pleural effusion which has improved  Active Problems Hyponatremia, acute on chronic - sodium has been in the mid 120s for quite some time.  Dr. Remi Haggard discussed with nephrology and patient was started on tolvaptan, his lisinopril and spironolactone have been held.  Continue tolvaptan, sodium has remained stable  CAD with history of stenting -chest pain on 9/4, high-sensitivity troponins flat, not in a pattern consistent with ACS.  History of AVR - recent echo showed moderate paravalvular leak involving the valve and valve TAVR.  Managed conservatively  History of bilateral PEs - continue Eliquis  Metastatic urothelial carcinoma status post bilateral nephrostomy tubes for hydronephrosis - last chemotherapy on 05/09/2020, outpatient follow-up with Dr. Delton Coombes  Essential hypertension-blood  pressure stable this morning  Anemia of chronic disease-hemoglobin stable  Generalized deconditioning-PT following, will need home health PT on discharge   Scheduled Meds: . apixaban  5 mg Oral BID  . Chlorhexidine Gluconate Cloth  6 each Topical Daily  . furosemide  80 mg Intravenous TID  . metoprolol succinate  50 mg Oral Daily  . tolvaptan  15 mg Oral Q24H   Continuous Infusions: PRN Meds:.acetaminophen, methocarbamol, morphine injection, ondansetron (ZOFRAN) IV, oxyCODONE, sodium chloride, sodium chloride flush  Diet Orders (From admission, onward)    Start     Ordered   05/17/20 1311  Diet Heart Room service appropriate? Yes; Fluid consistency: Thin; Fluid restriction: 1200 mL Fluid  Diet effective now       Question Answer Comment  Room service appropriate? Yes   Fluid consistency: Thin   Fluid restriction: 1200 mL Fluid      05/17/20 1310           DVT prophylaxis:  apixaban (ELIQUIS) tablet 5 mg     Code Status: DNR  Family Communication: no family at bedside   Status is: Inpatient  Remains inpatient appropriate because:Inpatient level of care appropriate due to severity of illness  Dispo: The patient is from: Home              Anticipated d/c is to: Home              Anticipated d/c date is: 2 days              Patient currently is not medically stable to d/c.  Consultants:  Cardiology   Procedures:  None   Microbiology  None   Antimicrobials: None     Objective: Vitals:   05/25/20 1000 05/25/20 1422 05/25/20 2151 05/26/20 0548  BP: (!) 121/91 126/70 120/76 111/73  Pulse: (!) 105 98 99 (!) 102  Resp: 20 17 15 16   Temp:   99 F (37.2 C)   TempSrc:      SpO2: 100% 100% 98%   Weight:      Height:        Intake/Output Summary (Last 24 hours) at 05/26/2020 0916 Last data filed at 05/26/2020 0300 Gross per 24 hour  Intake 1330 ml  Output 1150 ml  Net 180 ml   Filed Weights   05/23/20 0500 05/24/20 0600 05/25/20 0320  Weight: 56.7 kg  56.6 kg 55.5 kg    Examination:  Constitutional: Appears very anxious, tachypneic Eyes: No scleral icterus ENMT: Moist mucous membranes Neck: normal, supple Respiratory: Slightly diminished at the bases but overall clear without wheezing Cardiovascular: Regular rate and rhythm, holosystolic murmur, 1+ pitting edema Abdomen: Soft, nontender, nondistended, bowel sounds positive Musculoskeletal: no clubbing / cyanosis.  Skin: No rashes appreciated Neurologic: No focal deficits   Data Reviewed: I have independently reviewed following labs and imaging studies   CBC: Recent Labs  Lab 05/21/20 0644 05/21/20 0644 05/22/20 0411 05/23/20 0532 05/24/20 0842 05/25/20 0705 05/26/20 0725  WBC 10.8*   < > 8.9 8.8 9.0 9.7 9.3  NEUTROABS 8.4*  --  6.7  --   --   --   --   HGB 10.9*   < > 10.8* 11.3* 11.1* 10.7* 11.6*  HCT 34.9*   < > 33.2* 36.1* 34.7* 33.9* 36.4*  MCV 86.4   < > 85.8 86.8 85.7 86.7 85.4  PLT 115*   < > 103* 108* 113* 138* 156   < > = values in this interval not displayed.   Basic Metabolic Panel: Recent Labs  Lab 05/20/20 0616 05/20/20 0616 05/21/20 0644 05/21/20 0644 05/21/20 1216 05/21/20 1909 05/22/20 0411 05/22/20 1145 05/23/20 0532 05/24/20 0842 05/25/20 0705  NA 123*   < > 122*   < > 121*   < > 123* 123* 129* 128*  128* 128*  K 4.0   < > 4.2   < > 3.8  --  4.1  --  3.8 4.0  4.0 3.8  CL 86*   < > 85*   < > 85*  --  86*  --  88* 88*  88* 88*  CO2 28   < > 27   < > 27  --  24  --  27 27  27 26   GLUCOSE 79   < > 79   < > 145*  --  107*  --  76 91  88 96  BUN 26*   < > 25*   < > 24*  --  26*  --  25* 19  19 20   CREATININE 0.87   < > 0.78   < > 0.71  --  0.70  --  0.75 0.65  0.67 0.64  CALCIUM 7.6*   < > 7.9*   < > 7.5*  --  8.0*  --  8.4* 8.4*  8.4* 8.5*  MG 1.8  --  1.9  --   --   --  1.7  --  1.8 1.8  1.8  --    < > = values in this interval not displayed.   Liver Function Tests: Recent Labs  Lab 05/23/20 0532 05/24/20 0842  AST 28 31    ALT 18 21  ALKPHOS 588* 577*  BILITOT 1.6* 1.5*  PROT 6.6 6.2*  ALBUMIN 2.9*  2.8*   Coagulation Profile: No results for input(s): INR, PROTIME in the last 168 hours. HbA1C: No results for input(s): HGBA1C in the last 72 hours. CBG: No results for input(s): GLUCAP in the last 168 hours.  Recent Results (from the past 240 hour(s))  SARS Coronavirus 2 by RT PCR (hospital order, performed in [facility] hospital lab) Nasopharyngeal Nasopharyngeal Swab     Status: None   Collection Time: 05/16/20  9:42 PM   Specimen: Nasopharyngeal Swab  Result Value Ref Range Status   SARS Coronavirus 2 NEGATIVE NEGATIVE Final    Comment: (NOTE) SARS-CoV-2 target nucleic acids are NOT DETECTED.  The SARS-CoV-2 RNA is generally detectable in upper and lower respiratory specimens during the acute phase of infection. The lowest concentration of SARS-CoV-2 viral copies this assay can detect is 250 copies / mL. A negative result does not preclude SARS-CoV-2 infection and should not be used as the sole basis for treatment or other patient management decisions.  A negative result may occur with improper specimen collection / handling, submission of specimen other than nasopharyngeal swab, presence of viral mutation(s) within the areas targeted by this assay, and inadequate number of viral copies (<250 copies / mL). A negative result must be combined with clinical observations, patient history, and epidemiological information.  Fact Sheet for Patients:   StrictlyIdeas.no  Fact Sheet for Healthcare Providers: BankingDealers.co.za  This test is not yet approved or  cleared by the [place] FDA and has been authorized for detection and/or diagnosis of SARS-CoV-2 by FDA under an Emergency Use Authorization (EUA).  This EUA will remain in effect (meaning this test can be used) for the duration of the COVID-19 declaration under Section 564(b)(1) of the  Act, 21 U.S.C. section 360bbb-3(b)(1), unless the authorization is terminated or revoked sooner.  Performed at [facility], [address], [place], [place] [address]      Radiology Studies: DG CHEST PORT 1 VIEW  Result Date: 05/26/2020 CLINICAL DATA:  Congestive heart failure.  Renal cancer. EXAM: PORTABLE CHEST 1 VIEW COMPARISON:  May 16, 2020. FINDINGS: Stable cardiomegaly. No pneumothorax is noted. Left lung is clear. Status post aortic valve repair. Right internal jugular Port-A-Cath is unchanged in position. Mild right pleural effusion is noted which is decreased compared to prior exam. Probable right basilar atelectasis or infiltrate is noted. Bony thorax is unremarkable. IMPRESSION: Mild right pleural effusion is noted which is decreased compared to prior exam. Probable right basilar atelectasis or infiltrate is noted. Electronically Signed   By: Marijo Conception M.D.   On: 05/26/2020 08:10     Marzetta Board, MD, PhD [facility]  Between 7 am - 7 pm I am available, please contact me via Amion or Securechat  Between 7 pm - 7 am I am not available, please contact night coverage MD/APP via Amion

## 2020-05-26 NOTE — Plan of Care (Signed)

## 2020-05-27 DIAGNOSIS — R079 Chest pain, unspecified: Secondary | ICD-10-CM

## 2020-05-27 LAB — BASIC METABOLIC PANEL
Anion gap: 15 (ref 5–15)
BUN: 38 mg/dL — ABNORMAL HIGH (ref 8–23)
CO2: 26 mmol/L (ref 22–32)
Calcium: 8.5 mg/dL — ABNORMAL LOW (ref 8.9–10.3)
Chloride: 81 mmol/L — ABNORMAL LOW (ref 98–111)
Creatinine, Ser: 0.81 mg/dL (ref 0.61–1.24)
GFR calc Af Amer: >60 mL/min (ref >60)
GFR calc non Af Amer: >60 mL/min (ref >60)
Glucose, Bld: 86 mg/dL (ref 70–99)
Potassium: 3.8 mmol/L (ref 3.5–5.1)
Sodium: 122 mmol/L — ABNORMAL LOW (ref 135–145)

## 2020-05-27 LAB — MAGNESIUM: Magnesium: 1.8 mg/dL (ref 1.7–2.4)

## 2020-05-27 LAB — TROPONIN I (HIGH SENSITIVITY)
Troponin I (High Sensitivity): 34 ng/L — ABNORMAL HIGH (ref <18)
Troponin I (High Sensitivity): 36 ng/L — ABNORMAL HIGH (ref <18)

## 2020-05-27 MED ORDER — NITROGLYCERIN 0.4 MG SL SUBL
0.4000 mg | SUBLINGUAL_TABLET | SUBLINGUAL | Status: DC | PRN
  Administered 2020-05-29 (×3): 0.4 mg via SUBLINGUAL
  Filled 2020-05-27 (×2): qty 1

## 2020-05-27 NOTE — Progress Notes (Signed)
Hydrologist [facility])  Hospital Liaison: RN note         Notified by [facility] manager Tobi Bastos, CSW  of patient/family request for [facility] Palliative services at home after discharge.         Writer spoke with patient to confirm interest and explain services.               [place] Palliative team will follow up with patient after discharge.         Please call with any hospice or palliative related questions.         Thank you for this referral.         Farrel Gordon, RN, CCM  [facility] (listed on [place] under Hospice[facility])    [phone]

## 2020-05-27 NOTE — Care Management Important Message (Signed)
Important Message  Patient Details  Name: [name] MRN: [id number] Date of Birth: May 22, 1951   Medicare Important Message Given:  Yes     [name] 05/27/2020, 2:31 PM

## 2020-05-27 NOTE — Progress Notes (Signed)
Physical Therapy Treatment Patient Details Name: [name] MRN: [id number] DOB: [birth year] Today's Date: 05/27/2020    History of Present Illness 69 y.o. male  with past medical history of urothelial cancer with mets to bones- last seen by Dr. Delton Coombes on 8/19 with plans to start [place], CAD, AVR with TAVR 09/2017, CHF with EF 20-25% on most recent echo, HTN, HLD, chronic bilateral PE  admitted on 05/16/2020 with CHF exacerbation. Palliative medicine consulted for goals of care.    PT Comments    Patient in chair with O@ on 1 liter at arrival and agreed to physical therapy. Patient SPO2 at 94% on room air during initial exercises. Patient then became concerned with his O2 being off and took short shallow breathes (as if hyperventilating) where SPO2 reduced to 81%. Cued patient to take deeper longer breathes and SPO2 returned to 94%. Ambulated with paitetn on room air and SPO2 stayed at 99% entire time. Returned patient to chair with O2 off. Notified nursing about activity tolerance on room air.  Patient will continue to benefit from skilled physical therapy in hospital and recommended venue below to increase strength, balance, endurance for safe ADLs and gait.   Follow Up Recommendations  Home health PT;Supervision for mobility/OOB     Equipment Recommendations  None recommended by PT    Recommendations for Other Services       Precautions / Restrictions      Mobility  Bed Mobility                  Transfers     Transfers: Sit to/from Stand Sit to Stand: Supervision            Ambulation/Gait Ambulation/Gait assistance: Min guard;Supervision Gait Distance (Feet): 200 Feet Assistive device: Rolling walker (2 wheeled) Gait Pattern/deviations: Decreased stride length;Step-through pattern Gait velocity: decreased   General Gait Details: ambulated w/ RW with on room air - SPO2 stayed at 99% during ambulation   Stairs             Wheelchair  Mobility    Modified Rankin (Stroke Patients Only)       Balance                                            Cognition Arousal/Alertness: Awake/alert Behavior During Therapy: WFL for tasks assessed/performed Overall Cognitive Status: Within Functional Limits for tasks assessed                                        Exercises General Exercises - Lower Extremity Long Arc Quad: AROM;Strengthening;Both;10 reps;Seated Hip Flexion/Marching: AROM;Strengthening;Both;10 reps;Seated Toe Raises: AROM;Strengthening;Both;10 reps;Seated Heel Raises: AROM;Strengthening;Both;10 reps;Seated Other Exercises Other Exercises: sit to stand 3x2 with UE assit    General Comments        Pertinent Vitals/Pain Pain Assessment: No/denies pain    Home Living                      Prior Function            PT Goals (current goals can now be found in the care plan section) Acute Rehab PT Goals Patient Stated Goal: go home as long as he can obtain O2 for household use PT Goal Formulation: With patient Time For Goal  Achievement: 06/03/20 Potential to Achieve Goals: Good Progress towards PT goals: Progressing toward goals    Frequency    Min 3X/week      PT Plan Current plan remains appropriate    Co-evaluation              AM-PAC PT "6 Clicks" Mobility   Outcome Measure  Help needed turning from your back to your side while in a flat bed without using bedrails?: None Help needed moving from lying on your back to sitting on the side of a flat bed without using bedrails?: None Help needed moving to and from a bed to a chair (including a wheelchair)?: None Help needed standing up from a chair using your arms (e.g., wheelchair or bedside chair)?: None Help needed to walk in hospital room?: A Little Help needed climbing 3-5 steps with a railing? : A Lot 6 Click Score: 21    End of Session Equipment Utilized During Treatment: Gait  belt;Oxygen Activity Tolerance: Patient tolerated treatment well Patient left: with call bell/phone within reach;in chair Nurse Communication: Mobility status PT Visit Diagnosis: Unsteadiness on feet (R26.81);Other abnormalities of gait and mobility (R26.89);Muscle weakness (generalized) (M62.81)     Time: 3428-7681 PT Time Calculation (min) (ACUTE ONLY): 19 min  Charges:  $Gait Training: 8-22 mins                    3:01 PM, 05/27/20 Jerene Pitch, DPT Physical Therapy with [facility]  [phone] office

## 2020-05-27 NOTE — Progress Notes (Addendum)
PROGRESS NOTE  [name] MEQ:[id number] DOB: [birth year] DOA: 05/16/2020 PCP: [name], MD   LOS: 11 days   Brief Narrative / Interim history: 69 year old male with chronic combined CHF, metastatic urothelial cancer status post bilateral nephrostomy tubes, aortic valve replacement, hypertension, PAD, history of PE on anticoagulation, recurrent hospitalizations for CHF exacerbation/UTI/hyponatremia came in with difficulties breathing and leg swelling.  On admission BNP was elevated, chest x-ray showed right pleural effusion.  Cardiology was consulted, he was placed on IV Lasix  Subjective / 24h Interval events: Complains of chest pain.  Denies shortness of breath  Assessment & Plan: Principal Problem Acute on chronic combined systolic and diastolic CHF -he remains volume overloaded however improved, lower extremity edema is improving -Repeat 2D echo done in the beginning of August showed an EF of 20-35% -He is net -26 L this morning, good urine output, probably starting to be intravascularly depleted -hold IV Lasix due to hyponatremia  Active Problems Hyponatremia, acute on chronic - sodium has been in the mid 120s for quite some time.  Dr. Remi Haggard discussed with nephrology and patient was started on tolvaptan, his lisinopril and spironolactone have been held.  Continue tolvaptan, sodium now is getting worse again, 122 this morning. It is possible that he is getting intravascularly depleted, hold Lasix this afternoon and watch Na levels  CAD with history of stenting -chest pain on 9/4, high-sensitivity troponins flat, not in a pattern consistent with ACS.  Has recurrent chest pain on 9/6, repeat high-sensitivity troponin  History of AVR - recent echo showed moderate paravalvular leak involving the valve and valve TAVR.  Managed conservatively  History of bilateral PEs - continue Eliquis  Metastatic urothelial carcinoma status post bilateral nephrostomy tubes for  hydronephrosis - last chemotherapy on 05/09/2020, outpatient follow-up with Dr. Delton Coombes  Essential hypertension-blood pressure remained stable this morning  Anemia of chronic disease-hemoglobin stable  Generalized deconditioning-PT following, will need home health PT on discharge   Scheduled Meds: . apixaban  5 mg Oral BID  . Chlorhexidine Gluconate Cloth  6 each Topical Daily  . furosemide  80 mg Intravenous TID  . metoprolol succinate  50 mg Oral Daily  . tolvaptan  15 mg Oral Q24H   Continuous Infusions: PRN Meds:.acetaminophen, methocarbamol, morphine injection, nitroGLYCERIN, ondansetron (ZOFRAN) IV, oxyCODONE, sodium chloride, sodium chloride flush  Diet Orders (From admission, onward)    Start     Ordered   05/17/20 1311  Diet Heart Room service appropriate? Yes; Fluid consistency: Thin; Fluid restriction: 1200 mL Fluid  Diet effective now       Question Answer Comment  Room service appropriate? Yes   Fluid consistency: Thin   Fluid restriction: 1200 mL Fluid      05/17/20 1310           DVT prophylaxis:  apixaban (ELIQUIS) tablet 5 mg     Code Status: DNR  Family Communication: no family at bedside   Status is: Inpatient  Remains inpatient appropriate because:Inpatient level of care appropriate due to severity of illness  Dispo: The patient is from: Home              Anticipated d/c is to: Home              Anticipated d/c date is: 2 days              Patient currently is not medically stable to d/c.  Consultants:  Cardiology   Procedures:  None   Microbiology  None   Antimicrobials: None     Objective: Vitals:   05/25/20 2151 05/26/20 0548 05/26/20 2027 05/27/20 0620  BP: 120/76 111/73 114/85 (!) 120/100  Pulse: 99 (!) 102 93 91  Resp: 15 16 16 18   Temp: 99 F (37.2 C)  98.2 F (36.8 C) 97.8 F (36.6 C)  TempSrc:      SpO2: 98%  98% 97%  Weight:      Height:        Intake/Output Summary (Last 24 hours) at 05/27/2020 0713 Last  data filed at 05/27/2020 0500 Gross per 24 hour  Intake 480 ml  Output 2550 ml  Net -2070 ml   Filed Weights   05/23/20 0500 05/24/20 0600 05/25/20 0320  Weight: 56.7 kg 56.6 kg 55.5 kg    Examination:  Constitutional: Appears anxious Eyes: No scleral icterus ENMT: Moist external drains Neck: normal, supple Respiratory: Diminished at the bases with clear, no wheezing, no crackles Cardiovascular: Regular rate and rhythm, holosystolic murmur, 1+ pitting edema Abdomen: Soft, nontender, nondistended, bowel sounds positive Musculoskeletal: no clubbing / cyanosis.  Skin: No rashes appreciated Neurologic: Nonfocal   Data Reviewed: I have independently reviewed following labs and imaging studies   CBC: Recent Labs  Lab 05/21/20 0644 05/21/20 0644 05/22/20 0411 05/23/20 0532 05/24/20 0842 05/25/20 0705 05/26/20 0725  WBC 10.8*   < > 8.9 8.8 9.0 9.7 9.3  NEUTROABS 8.4*  --  6.7  --   --   --   --   HGB 10.9*   < > 10.8* 11.3* 11.1* 10.7* 11.6*  HCT 34.9*   < > 33.2* 36.1* 34.7* 33.9* 36.4*  MCV 86.4   < > 85.8 86.8 85.7 86.7 85.4  PLT 115*   < > 103* 108* 113* 138* 156   < > = values in this interval not displayed.   Basic Metabolic Panel: Recent Labs  Lab 05/21/20 0644 05/21/20 1216 05/22/20 0411 05/22/20 0411 05/22/20 1145 05/23/20 0532 05/24/20 0842 05/25/20 0705 05/26/20 0725  NA 122*   < > 123*   < > 123* 129* 128*  128* 128* 124*  K 4.2   < > 4.1  --   --  3.8 4.0  4.0 3.8 4.0  CL 85*   < > 86*  --   --  88* 88*  88* 88* 84*  CO2 27   < > 24  --   --  27 27  27 26 26   GLUCOSE 79   < > 107*  --   --  76 91  88 96 94  BUN 25*   < > 26*  --   --  25* 19  19 20  34*  CREATININE 0.78   < > 0.70  --   --  0.75 0.65  0.67 0.64 0.79  CALCIUM 7.9*   < > 8.0*  --   --  8.4* 8.4*  8.4* 8.5* 8.3*  MG 1.9  --  1.7  --   --  1.8 1.8  1.8  --   --    < > = values in this interval not displayed.   Liver Function Tests: Recent Labs  Lab 05/23/20 0532  05/24/20 0842  AST 28 31  ALT 18 21  ALKPHOS 588* 577*  BILITOT 1.6* 1.5*  PROT 6.6 6.2*  ALBUMIN 2.9* 2.8*   Coagulation Profile: No results for input(s): INR, PROTIME in the last 168 hours. HbA1C: No results for input(s): HGBA1C in the last 72  hours. CBG: No results for input(s): GLUCAP in the last 168 hours.  No results found for this or any previous visit (from the past 240 hour(s)).   Radiology Studies: DG CHEST PORT 1 VIEW  Result Date: 05/26/2020 CLINICAL DATA:  Congestive heart failure.  Renal cancer. EXAM: PORTABLE CHEST 1 VIEW COMPARISON:  May 16, 2020. FINDINGS: Stable cardiomegaly. No pneumothorax is noted. Left lung is clear. Status post aortic valve repair. Right internal jugular Port-A-Cath is unchanged in position. Mild right pleural effusion is noted which is decreased compared to prior exam. Probable right basilar atelectasis or infiltrate is noted. Bony thorax is unremarkable. IMPRESSION: Mild right pleural effusion is noted which is decreased compared to prior exam. Probable right basilar atelectasis or infiltrate is noted. Electronically Signed   By: Marijo Conception M.D.   On: 05/26/2020 08:10     Marzetta Board, MD, PhD [facility]  Between 7 am - 7 pm I am available, please contact me via Amion or Securechat  Between 7 pm - 7 am I am not available, please contact night coverage MD/APP via Amion

## 2020-05-27 NOTE — Progress Notes (Signed)
Patient woke with "10/10" left-sided chest pain that woke him from sleep. He denies any change in his SOB, denies nausea, and states it feels the same as an episode he had a couple days ago.   He is alert, not in any distress, not pale or diaphoretic.   EKG features SR with PACs, incomplete LBBB, LAFB, and anterolateral ST-T abnormality.   Continue cardiac monitoring, check troponin, trial NTG.

## 2020-05-28 ENCOUNTER — Inpatient Hospital Stay (HOSPITAL_COMMUNITY): Payer: Medicare Other

## 2020-05-28 ENCOUNTER — Inpatient Hospital Stay (HOSPITAL_COMMUNITY): Payer: Medicare Other | Admitting: Hematology

## 2020-05-28 LAB — COMPREHENSIVE METABOLIC PANEL
ALT: 19 U/L (ref 0–44)
AST: 33 U/L (ref 15–41)
Albumin: 2.9 g/dL — ABNORMAL LOW (ref 3.5–5.0)
Alkaline Phosphatase: 660 U/L — ABNORMAL HIGH (ref 38–126)
Anion gap: 17 — ABNORMAL HIGH (ref 5–15)
BUN: 45 mg/dL — ABNORMAL HIGH (ref 8–23)
CO2: 23 mmol/L (ref 22–32)
Calcium: 8.4 mg/dL — ABNORMAL LOW (ref 8.9–10.3)
Chloride: 82 mmol/L — ABNORMAL LOW (ref 98–111)
Creatinine, Ser: 0.88 mg/dL (ref 0.61–1.24)
GFR calc Af Amer: >60 mL/min (ref >60)
GFR calc non Af Amer: >60 mL/min (ref >60)
Glucose, Bld: 71 mg/dL (ref 70–99)
Potassium: 4.1 mmol/L (ref 3.5–5.1)
Sodium: 122 mmol/L — ABNORMAL LOW (ref 135–145)
Total Bilirubin: 1.9 mg/dL — ABNORMAL HIGH (ref 0.3–1.2)
Total Protein: 6.2 g/dL — ABNORMAL LOW (ref 6.5–8.1)

## 2020-05-28 LAB — CBC
HCT: 35.8 % — ABNORMAL LOW (ref 39.0–52.0)
Hemoglobin: 11.2 g/dL — ABNORMAL LOW (ref 13.0–17.0)
MCH: 27.3 pg (ref 26.0–34.0)
MCHC: 31.3 g/dL (ref 30.0–36.0)
MCV: 87.1 fL (ref 80.0–100.0)
Platelets: 142 10*3/uL — ABNORMAL LOW (ref 150–400)
RBC: 4.11 MIL/uL — ABNORMAL LOW (ref 4.22–5.81)
RDW: 22.1 % — ABNORMAL HIGH (ref 11.5–15.5)
WBC: 9.4 10*3/uL (ref 4.0–10.5)
nRBC: 0.4 % — ABNORMAL HIGH (ref 0.0–0.2)

## 2020-05-28 MED ORDER — TORSEMIDE 20 MG PO TABS
40.0000 mg | ORAL_TABLET | Freq: Two times a day (BID) | ORAL | Status: DC
  Administered 2020-05-28 – 2020-05-29 (×3): 40 mg via ORAL
  Filled 2020-05-28 (×3): qty 2

## 2020-05-28 NOTE — Progress Notes (Signed)
PROGRESS NOTE  [name] JGG:[id number] DOB: 1951/05/02 DOA: 05/16/2020 PCP: [name], MD   LOS: 12 days   Brief Narrative / Interim history: 69 year old male with chronic combined CHF, metastatic urothelial cancer status post bilateral nephrostomy tubes, aortic valve replacement, hypertension, PAD, history of PE on anticoagulation, recurrent hospitalizations for CHF exacerbation/UTI/hyponatremia came in with difficulties breathing and leg swelling.  On admission BNP was elevated, chest x-ray showed right pleural effusion.  Cardiology was consulted, he was placed on IV Lasix  Subjective / 24h Interval events: Feels very weak.  No chest pain this morning.  Assessment & Plan: Principal Problem Acute on chronic combined systolic and diastolic CHF -he remains volume overloaded however improved, lower extremity edema is improving -Repeat 2D echo done in the beginning of August showed an EF of 20-35% -He is net -26.9 L this morning.  His weight has remained stable around 122.  Will transition to torsemide 40 twice daily as suggested by cardiology, he will probably need to go home on that regimen  Active Problems Hyponatremia, acute on chronic - sodium has been in the mid 120s for quite some time.  Dr. Remi Haggard discussed with nephrology and patient was started on tolvaptan, his lisinopril and spironolactone have been held.  He has been on tolvaptan, sodium initially improved to 128 however decreased to 122 again.  I wonder whether he is intravascularly depleted, his IV Lasix has been on hold yesterday and sodium has now dropped any more today.  Discussed with cardiology, carefully start oral diuretics and monitor.  If his sodium drops again may benefit from nephrology input  CAD with history of stenting -patient has had 2 episodes of chest pressure, describing "like an elephant on his chest" on 9/4 and again on 9/6.  High-sensitivity troponins overall flat.  History of AVR - recent  echo showed moderate paravalvular leak involving the valve and valve TAVR.  Managed conservatively, not a candidate for intervention  History of bilateral PEs - continue Eliquis  Metastatic urothelial carcinoma status post bilateral nephrostomy tubes for hydronephrosis - last chemotherapy on 05/09/2020, outpatient follow-up with Dr. Delton Coombes  Essential hypertension-blood pressure stable, continue metoprolol and torsemide  Anemia of chronic disease-hemoglobin stable  Generalized deconditioning-PT following, will need home health PT on discharge   Scheduled Meds: . apixaban  5 mg Oral BID  . Chlorhexidine Gluconate Cloth  6 each Topical Daily  . metoprolol succinate  50 mg Oral Daily  . tolvaptan  15 mg Oral Q24H  . torsemide  40 mg Oral BID   Continuous Infusions: PRN Meds:.acetaminophen, methocarbamol, morphine injection, nitroGLYCERIN, ondansetron (ZOFRAN) IV, oxyCODONE, sodium chloride, sodium chloride flush  Diet Orders (From admission, onward)    Start     Ordered   05/17/20 1311  Diet Heart Room service appropriate? Yes; Fluid consistency: Thin; Fluid restriction: 1200 mL Fluid  Diet effective now       Question Answer Comment  Room service appropriate? Yes   Fluid consistency: Thin   Fluid restriction: 1200 mL Fluid      05/17/20 1310           DVT prophylaxis:  apixaban (ELIQUIS) tablet 5 mg     Code Status: DNR  Family Communication: no family at bedside   Status is: Inpatient  Remains inpatient appropriate because:Inpatient level of care appropriate due to severity of illness  Dispo: The patient is from: Home              Anticipated d/c is  to: Home              Anticipated d/c date is: 2 days              Patient currently is not medically stable to d/c.  Consultants:  Cardiology   Procedures:  None   Microbiology  None   Antimicrobials: None     Objective: Vitals:   05/27/20 1151 05/27/20 2145 05/28/20 0500 05/28/20 0522  BP: 109/71  118/66  131/72  Pulse: 86 84  90  Resp: 17 16  16   Temp: 98.3 F (36.8 C) 98.1 F (36.7 C)  97.7 F (36.5 C)  TempSrc:      SpO2: 93% 94%  98%  Weight:   55.7 kg   Height:        Intake/Output Summary (Last 24 hours) at 05/28/2020 0936 Last data filed at 05/28/2020 0500 Gross per 24 hour  Intake --  Output 825 ml  Net -825 ml   Filed Weights   05/24/20 0600 05/25/20 0320 05/28/20 0500  Weight: 56.6 kg 55.5 kg 55.7 kg    Examination:  Constitutional: No distress Eyes: No scleral icterus ENMT: Moist mucous membranes Neck: normal, supple Respiratory: Diminished at the bases but overall clear without wheezing or crackles Cardiovascular: Regular rate and rhythm, holosystolic murmur, 1+ pitting edema Abdomen: Soft, nontender, nondistended, bowel sounds positive Musculoskeletal: no clubbing / cyanosis.  Skin: No rashes appreciated Neurologic: No focal deficits   Data Reviewed: I have independently reviewed following labs and imaging studies   CBC: Recent Labs  Lab 05/22/20 0411 05/22/20 0411 05/23/20 0532 05/24/20 2376 05/25/20 0705 05/26/20 0725 05/28/20 0431  WBC 8.9   < > 8.8 9.0 9.7 9.3 9.4  NEUTROABS 6.7  --   --   --   --   --   --   HGB 10.8*   < > 11.3* 11.1* 10.7* 11.6* 11.2*  HCT 33.2*   < > 36.1* 34.7* 33.9* 36.4* 35.8*  MCV 85.8   < > 86.8 85.7 86.7 85.4 87.1  PLT 103*   < > 108* 113* 138* 156 142*   < > = values in this interval not displayed.   Basic Metabolic Panel: Recent Labs  Lab 05/22/20 0411 05/22/20 1145 05/23/20 0532 05/23/20 0532 05/24/20 2831 05/25/20 0705 05/26/20 0725 05/27/20 0835 05/28/20 0431  NA 123*   < > 129*   < > 128*  128* 128* 124* 122* 122*  K 4.1  --  3.8   < > 4.0  4.0 3.8 4.0 3.8 4.1  CL 86*  --  88*   < > 88*  88* 88* 84* 81* 82*  CO2 24  --  27   < > 27  27 26 26 26 23   GLUCOSE 107*  --  76   < > 91  88 96 94 86 71  BUN 26*  --  25*   < > 19  19 20  34* 38* 45*  CREATININE 0.70  --  0.75   < > 0.65  0.67  0.64 0.79 0.81 0.88  CALCIUM 8.0*  --  8.4*   < > 8.4*  8.4* 8.5* 8.3* 8.5* 8.4*  MG 1.7  --  1.8  --  1.8  1.8  --   --  1.8  --    < > = values in this interval not displayed.   Liver Function Tests: Recent Labs  Lab 05/23/20 0532 05/24/20 0842 05/28/20 0431  AST 28  31 33  ALT 18 21 19   ALKPHOS 588* 577* 660*  BILITOT 1.6* 1.5* 1.9*  PROT 6.6 6.2* 6.2*  ALBUMIN 2.9* 2.8* 2.9*   Coagulation Profile: No results for input(s): INR, PROTIME in the last 168 hours. HbA1C: No results for input(s): HGBA1C in the last 72 hours. CBG: No results for input(s): GLUCAP in the last 168 hours.  No results found for this or any previous visit (from the past 240 hour(s)).   Radiology Studies: No results found.   Marzetta Board, MD, PhD [facility]  Between 7 am - 7 pm I am available, please contact me via Amion or Securechat  Between 7 pm - 7 am I am not available, please contact night coverage MD/APP via Amion

## 2020-05-28 NOTE — Progress Notes (Signed)
Progress Note  Patient Name: [name] Date of Encounter: 05/28/2020  Primary Cardiologist: Rozann Lesches, MD  Subjective   States that he feels weak.  No chest pain or shortness of breath at rest.  Appetite is limited.  Inpatient Medications    Scheduled Meds: . apixaban  5 mg Oral BID  . Chlorhexidine Gluconate Cloth  6 each Topical Daily  . metoprolol succinate  50 mg Oral Daily  . tolvaptan  15 mg Oral Q24H    PRN Meds: acetaminophen, methocarbamol, morphine injection, nitroGLYCERIN, ondansetron (ZOFRAN) IV, oxyCODONE, sodium chloride, sodium chloride flush   Vital Signs    Vitals:   05/27/20 1151 05/27/20 2145 05/28/20 0500 05/28/20 0522  BP: 109/71 118/66  131/72  Pulse: 86 84  90  Resp: 17 16  16   Temp: 98.3 F (36.8 C) 98.1 F (36.7 C)  97.7 F (36.5 C)  TempSrc:      SpO2: 93% 94%  98%  Weight:   55.7 kg   Height:        Intake/Output Summary (Last 24 hours) at 05/28/2020 0834 Last data filed at 05/28/2020 0500 Gross per 24 hour  Intake --  Output 825 ml  Net -825 ml   Filed Weights   05/24/20 0600 05/25/20 0320 05/28/20 0500  Weight: 56.6 kg 55.5 kg 55.7 kg    Telemetry    Sinus rhythm.  Personally reviewed.  ECG    No ECG reviewed.  Physical Exam   GEN:  Chronically ill-appearing, cachectic. Neck: No JVD. Cardiac: RRR, 2/6 systolic and 3/6 diastolic murmurs.  Respiratory: Nonlabored.  Decreased breath sounds at bases. GI: Soft, nontender, bowel sounds present. MS:  Persistent but significantly improved lower extremity edema; No deformity. Neuro:  Nonfocal.  Labs    Chemistry Recent Labs  Lab 05/23/20 0532 05/23/20 0532 05/24/20 6389 05/25/20 3734 05/26/20 0725 05/27/20 0835 05/28/20 0431  NA 129*   < > 128*  128*   < > 124* 122* 122*  K 3.8   < > 4.0  4.0   < > 4.0 3.8 4.1  CL 88*   < > 88*  88*   < > 84* 81* 82*  CO2 27   < > 27  27   < > 26 26 23   GLUCOSE 76   < > 91  88   < > 94 86 71  BUN 25*   < > 19   19   < > 34* 38* 45*  CREATININE 0.75   < > 0.65  0.67   < > 0.79 0.81 0.88  CALCIUM 8.4*   < > 8.4*  8.4*   < > 8.3* 8.5* 8.4*  PROT 6.6  --  6.2*  --   --   --  6.2*  ALBUMIN 2.9*  --  2.8*  --   --   --  2.9*  AST 28  --  31  --   --   --  33  ALT 18  --  21  --   --   --  19  ALKPHOS 588*  --  577*  --   --   --  660*  BILITOT 1.6*  --  1.5*  --   --   --  1.9*  GFRNONAA >60   < > >60  >60   < > >60 >60 >60  GFRAA >60   < > >60  >60   < > >60 >60 >60  ANIONGAP  14   < > 13  13   < > 14 15 17*   < > = values in this interval not displayed.     Hematology Recent Labs  Lab 05/25/20 0705 05/26/20 0725 05/28/20 0431  WBC 9.7 9.3 9.4  RBC 3.91* 4.26 4.11*  HGB 10.7* 11.6* 11.2*  HCT 33.9* 36.4* 35.8*  MCV 86.7 85.4 87.1  MCH 27.4 27.2 27.3  MCHC 31.6 31.9 31.3  RDW 23.1* 22.5* 22.1*  PLT 138* 156 142*    Cardiac Enzymes Recent Labs  Lab 05/17/20 0225 05/25/20 0705 05/25/20 1324 05/27/20 0835 05/27/20 1044  TROPONINIHS 61* 42* 48* 34* 36*    Radiology    No results found.  Cardiac Studies   Echocardiogram 04/23/2020: 1. Left ventricular ejection fraction, by estimation, is 25 to 30%. The  left ventricle has severely decreased function. The left ventricle  demonstrates global hypokinesis. The left ventricular internal cavity size  was moderately dilated. There is mild  left ventricular hypertrophy. Left ventricular diastolic parameters are  indeterminate.  2. Right ventricular systolic function is severely reduced. The right  ventricular size is mildly enlarged. There is moderately elevated  pulmonary artery systolic pressure. The estimated right ventricular  systolic pressure is 29.5 mmHg.  3. Left atrial size was severely dilated.  4. Right atrial size was severely dilated.  5. The mitral valve is abnormal. Mild to moderate mitral valve  regurgitation.  6. The aortic valve has been repaired/replaced. Aortic valve  regurgitation is trivial. There  is a 26 mm Edwards Sapien prosthetic  (TAVR) valve present in the aortic position (patient has valve in valve  TAVR with previous bioprosthetic AVR). Aortic valve  mean gradient measures 8.0 mmHg. There is a moderate paravalvular leak at  9 o'clock position.  7. The inferior vena cava is dilated in size with <50% respiratory  variability, suggesting right atrial pressure of 15 mmHg.   Patient Profile     69 y.o. male with past medical history of CAD (s/p BMS to RCA in 2014), AVR(s/p bioprosthetic AVR and ultimately valve in valve TAVR in 09/2017 due to torncoronary cusp of his previously placed bioprosthetic AVR), chronic combined systolic and diastolic CHF (EF 28-41% by echo in 12/2019, at 20-25% by repeat echo in 04/2020),PAD (s/p left SFA intervention in 10/2018), HTN, HLD, recently diagnosed bilateral PE, metastatic urothelial cancer(diagnosed in 10/2019), chronic anemia and history of DVT.  Assessment & Plan    1.  Acute on chronic combined heart failure with LVEF 20 to 25% by most recent assessment, also severe RV dysfunction.  He has diuresed substantially during hospital stay on IV Lasix.  Weight now holding in the low 120s.  He has been hyponatremic, recently on tolvaptan, and diuretics held although still with net negative urine output.  2.  Aortic valve disease as discussed above, most recently with valve in valve TAVR and moderate paravalvular aortic regurgitation that is being managed conservatively at this point.  He is not a good candidate for reintervention.  3.  Pulmonary embolus, continues on Eliquis.  4.  CAD status post BMS to the RCA in 2014, no active angina at this time.  5.  Metastatic urothelial carcinoma status post bilateral nephrostomy tubes in the setting of hydronephrosis and undergoing chemotherapy per Dr. Delton Coombes.  Lasix, Aldactone, and ACE inhibitor remain on hold in the setting of hyponatremia.  Continue Eliquis and Toprol-XL.  He remains on  tolvaptan per primary team.  I see that hospice program plans  to follow-up with patient after discharge.  Signed, Rozann Lesches, MD  05/28/2020, 8:34 AM

## 2020-05-28 NOTE — Progress Notes (Signed)
Patient upset and has requested that nursing staff does not come in his room for the next two hours due to staff "disturbing him and not being able to get rest". Explained the importance of being checked on every hour but patient requested that he be left alone. Will respect patient wishes at this time.

## 2020-05-28 NOTE — Progress Notes (Addendum)
Physical Therapy Treatment Patient Details Name: [name] MRN: [id number] DOB: 24-Feb-1951 Today's Date: 05/28/2020    History of Present Illness 69 y.o. male  with past medical history of urothelial cancer with mets to bones- last seen by Dr. Delton Coombes on 8/19 with plans to start [place], CAD, AVR with TAVR 09/2017, CHF with EF 20-25% on most recent echo, HTN, HLD, chronic bilateral PE  admitted on 05/16/2020 with CHF exacerbation. Palliative medicine consulted for goals of care.    PT Comments    Patient was pacing around the room upon entering at beginning of session with O2 sat at 65% on room air. Pt given instructions to sit and perform pursed lip breathing, during which O2 rose to 82%. The patient performed sit to stand min guard and ambulated 90 feet RW min guard without LOB. He demonstrated BLE weakness, heavily relying on RW for ambulation. O2 sat hovered around 70% RA during ambulation, but increased to 91% once assisted with 1.5 LPM [place]. Once back in the room on 1.5 LPM [place], the patient tolerated seated therapeutic exercises with O2 sat maintained at 92%. Patient tolerated sitting up in chair after therapy- RN notified. PLAN: The patient will continue to benefit from skilled physical therapy services in hospital at recommended venue below in order to improve balance, gait, and ADL's to promote independence in functional activities.     Follow Up Recommendations  Home health PT;Supervision for mobility/OOB     Equipment Recommendations  None recommended by PT    Recommendations for Other Services       Precautions / Restrictions Precautions Precautions: Fall Precaution Comments: bil nephrostomy drains Restrictions Weight Bearing Restrictions: No    Mobility  Bed Mobility               General bed mobility comments: patient in recliner at beginning and end of session.  Transfers Overall transfer level: Needs assistance Equipment used: Rolling walker (2  wheeled) Transfers: Sit to/from Stand Sit to Stand: Min guard;Supervision         General transfer comment: pt reported BLE pain during standing that had been present for most of the day  Ambulation/Gait Ambulation/Gait assistance: Supervision;Min guard Gait Distance (Feet): 90 Feet Assistive device: Rolling walker (2 wheeled) Gait Pattern/deviations: Decreased stride length;Step-through pattern;Trunk flexed Gait velocity: decreased   General Gait Details: ambulated w/ RW with on room air - SPO2 dropped to 75% during ambulation   Stairs             Wheelchair Mobility    Modified Rankin (Stroke Patients Only)       Balance Overall balance assessment: Needs assistance Sitting-balance support: No upper extremity supported;Feet supported Sitting balance-Leahy Scale: Good Sitting balance - Comments: seated edge of chair   Standing balance support: Bilateral upper extremity supported;During functional activity Standing balance-Leahy Scale: Good Standing balance comment: reliance on RW during ambulation, slow labored steps with soft voice                            Cognition Arousal/Alertness: Awake/alert Behavior During Therapy: WFL for tasks assessed/performed Overall Cognitive Status: Within Functional Limits for tasks assessed                                        Exercises General Exercises - Lower Extremity Long Arc Quad: AROM;Strengthening;Both;10 reps;Seated Hip Flexion/Marching: AROM;Strengthening;Both;10  reps;Seated Toe Raises: AROM;Strengthening;Both;10 reps;Seated Heel Raises: AROM;Strengthening;Both;10 reps;Seated    General Comments        Pertinent Vitals/Pain Pain Assessment: Faces Faces Pain Scale: Hurts whole lot Pain Location: BLE's Pain Descriptors / Indicators: Aching;Sore Pain Intervention(s): Limited activity within patient's tolerance;Monitored during session;Premedicated before session;Relaxation     Home Living                      Prior Function            PT Goals (current goals can now be found in the care plan section) Acute Rehab PT Goals Patient Stated Goal: go home as long as he can obtain O2 for household use PT Goal Formulation: With patient Time For Goal Achievement: 06/03/20 Potential to Achieve Goals: Good Progress towards PT goals: Progressing toward goals    Frequency    Min 3X/week      PT Plan Current plan remains appropriate    Co-evaluation              AM-PAC PT "6 Clicks" Mobility   Outcome Measure  Help needed turning from your back to your side while in a flat bed without using bedrails?: None Help needed moving from lying on your back to sitting on the side of a flat bed without using bedrails?: None Help needed moving to and from a bed to a chair (including a wheelchair)?: A Little Help needed standing up from a chair using your arms (e.g., wheelchair or bedside chair)?: None Help needed to walk in hospital room?: A Little Help needed climbing 3-5 steps with a railing? : A Lot 6 Click Score: 20    End of Session Equipment Utilized During Treatment: Gait belt;Oxygen Activity Tolerance: Patient tolerated treatment well;Patient limited by fatigue Patient left: in chair;with call bell/phone within reach Nurse Communication: Mobility status PT Visit Diagnosis: Unsteadiness on feet (R26.81);Other abnormalities of gait and mobility (R26.89);Muscle weakness (generalized) (M62.81);Pain Pain - part of body: Leg (BLE)     Time: 7628-3151 PT Time Calculation (min) (ACUTE ONLY): 20 min  Charges:  $Therapeutic Activity: 8-22 mins                     4:09 PM , 05/28/20 Karlyn Agee, SPT Physical Therapy with [facility] [phone] office  During this treatment session, the therapist was present, participating in and directing the treatment.  4:09 PM, 05/28/20 Lonell Grandchild, MPT Physical Therapist  with [facility] [phone] office [phone] mobile phone

## 2020-05-28 NOTE — Progress Notes (Signed)
PT Cancellation Note  Patient Details Name: [name] MRN: [id number] DOB: 04-Dec-1950   Cancelled Treatment:    Reason Eval/Treat Not Completed: Patient declined, no reason specified. Pt reports "9/10" pain in legs and back. Pt reports "I'm in so much pain, I just want to lay here and rest". Pt denies requesting pain medication from nursing so therapist encouraged pt to notify nursing of pain and needs; Therapist notified RN of pt's pain.    Talbot Grumbling PT, DPT 05/28/20, 9:26 AM [phone]

## 2020-05-29 LAB — BASIC METABOLIC PANEL
Anion gap: 18 — ABNORMAL HIGH (ref 5–15)
BUN: 39 mg/dL — ABNORMAL HIGH (ref 8–23)
CO2: 26 mmol/L (ref 22–32)
Calcium: 8.8 mg/dL — ABNORMAL LOW (ref 8.9–10.3)
Chloride: 84 mmol/L — ABNORMAL LOW (ref 98–111)
Creatinine, Ser: 0.78 mg/dL (ref 0.61–1.24)
GFR calc Af Amer: >60 mL/min (ref >60)
GFR calc non Af Amer: >60 mL/min (ref >60)
Glucose, Bld: 77 mg/dL (ref 70–99)
Potassium: 3.8 mmol/L (ref 3.5–5.1)
Sodium: 128 mmol/L — ABNORMAL LOW (ref 135–145)

## 2020-05-29 MED ORDER — ALPRAZOLAM 0.5 MG PO TABS
0.5000 mg | ORAL_TABLET | Freq: Two times a day (BID) | ORAL | Status: DC | PRN
  Administered 2020-05-29: 0.5 mg via ORAL
  Filled 2020-05-29 (×2): qty 1

## 2020-05-29 MED ORDER — TORSEMIDE 20 MG PO TABS: 40.0000 mg | ORAL_TABLET | Freq: Every day | ORAL | 0 refills | Status: AC

## 2020-05-29 MED ORDER — TORSEMIDE 20 MG PO TABS
40.0000 mg | ORAL_TABLET | Freq: Every day | ORAL | Status: DC
  Administered 2020-05-30: 40 mg via ORAL
  Filled 2020-05-29: qty 2

## 2020-05-29 NOTE — Progress Notes (Signed)
Progress Note  Patient Name: [name] Date of Encounter: 05/29/2020  Primary Cardiologist: Rozann Lesches, MD  Subjective   Hungry for breakfast this morning.  No shortness of breath at rest, still has leg edema present.  Inpatient Medications    Scheduled Meds: . apixaban  5 mg Oral BID  . Chlorhexidine Gluconate Cloth  6 each Topical Daily  . metoprolol succinate  50 mg Oral Daily  . tolvaptan  15 mg Oral Q24H  . torsemide  40 mg Oral BID    PRN Meds: acetaminophen, methocarbamol, morphine injection, nitroGLYCERIN, ondansetron (ZOFRAN) IV, oxyCODONE, sodium chloride, sodium chloride flush   Vital Signs    Vitals:   05/28/20 1600 05/28/20 2130 05/29/20 0500 05/29/20 0609  BP: 111/73 115/68  119/77  Pulse: 91 80  82  Resp: 15 20  15   Temp: 98.3 F (36.8 C) 97.7 F (36.5 C)  97.9 F (36.6 C)  TempSrc:  Oral  Oral  SpO2: 98% 97%  100%  Weight:   53.3 kg   Height:        Intake/Output Summary (Last 24 hours) at 05/29/2020 0854 Last data filed at 05/29/2020 0729 Gross per 24 hour  Intake --  Output 2050 ml  Net -2050 ml   Filed Weights   05/25/20 0320 05/28/20 0500 05/29/20 0500  Weight: 55.5 kg 55.7 kg 53.3 kg    Telemetry    Sinus rhythm.  Personally reviewed.  ECG    No ECG reviewed.  Physical Exam   GEN:  Chronically ill-appearing, cachectic. Neck: No JVD. Cardiac:  RRR with 2/6 systolic and 3/6 diastolic murmurs.  Respiratory: Nonlabored.  Decreased breath sounds at the bases. GI: Soft, nontender, bowel sounds present. MS:  Persistent leg edema, left greater than right. Neuro:  Nonfocal.  Labs    Chemistry Recent Labs  Lab 05/23/20 0532 05/23/20 0532 05/24/20 2440 05/25/20 1027 05/27/20 0835 05/28/20 0431 05/29/20 0640  NA 129*   < > 128*  128*   < > 122* 122* 128*  K 3.8   < > 4.0  4.0   < > 3.8 4.1 3.8  CL 88*   < > 88*  88*   < > 81* 82* 84*  CO2 27   < > 27  27   < > 26 23 26   GLUCOSE 76   < > 91  88   < > 86 71  77  BUN 25*   < > 19  19   < > 38* 45* 39*  CREATININE 0.75   < > 0.65  0.67   < > 0.81 0.88 0.78  CALCIUM 8.4*   < > 8.4*  8.4*   < > 8.5* 8.4* 8.8*  PROT 6.6  --  6.2*  --   --  6.2*  --   ALBUMIN 2.9*  --  2.8*  --   --  2.9*  --   AST 28  --  31  --   --  33  --   ALT 18  --  21  --   --  19  --   ALKPHOS 588*  --  577*  --   --  660*  --   BILITOT 1.6*  --  1.5*  --   --  1.9*  --   GFRNONAA >60   < > >60  >60   < > >60 >60 >60  GFRAA >60   < > >60  >60   < > >  60 >60 >60  ANIONGAP 14   < > 13  13   < > 15 17* 18*   < > = values in this interval not displayed.     Hematology Recent Labs  Lab 05/25/20 0705 05/26/20 0725 05/28/20 0431  WBC 9.7 9.3 9.4  RBC 3.91* 4.26 4.11*  HGB 10.7* 11.6* 11.2*  HCT 33.9* 36.4* 35.8*  MCV 86.7 85.4 87.1  MCH 27.4 27.2 27.3  MCHC 31.6 31.9 31.3  RDW 23.1* 22.5* 22.1*  PLT 138* 156 142*    Cardiac Enzymes Recent Labs  Lab 05/17/20 0225 05/25/20 0705 05/25/20 1324 05/27/20 0835 05/27/20 1044  TROPONINIHS 61* 42* 48* 34* 36*    Radiology    No results found.  Cardiac Studies   Echocardiogram 04/23/2020: 1. Left ventricular ejection fraction, by estimation, is 25 to 30%. The  left ventricle has severely decreased function. The left ventricle  demonstrates global hypokinesis. The left ventricular internal cavity size  was moderately dilated. There is mild  left ventricular hypertrophy. Left ventricular diastolic parameters are  indeterminate.  2. Right ventricular systolic function is severely reduced. The right  ventricular size is mildly enlarged. There is moderately elevated  pulmonary artery systolic pressure. The estimated right ventricular  systolic pressure is 39.7 mmHg.  3. Left atrial size was severely dilated.  4. Right atrial size was severely dilated.  5. The mitral valve is abnormal. Mild to moderate mitral valve  regurgitation.  6. The aortic valve has been repaired/replaced. Aortic valve   regurgitation is trivial. There is a 26 mm Edwards Sapien prosthetic  (TAVR) valve present in the aortic position (patient has valve in valve  TAVR with previous bioprosthetic AVR). Aortic valve  mean gradient measures 8.0 mmHg. There is a moderate paravalvular leak at  9 o'clock position.  7. The inferior vena cava is dilated in size with <50% respiratory  variability, suggesting right atrial pressure of 15 mmHg.   Patient Profile     69 y.o. male with past medical history of CAD (s/p BMS to RCA in 2014), AVR(s/p bioprosthetic AVR and ultimately valve in valve TAVR in 09/2017 due to torncoronary cusp of his previously placed bioprosthetic AVR), chronic combined systolic and diastolic CHF (EF 67-34% by echo in 12/2019, at 20-25% by repeat echo in 04/2020),PAD (s/p left SFA intervention in 10/2018), HTN, HLD, recently diagnosed bilateral PE, metastatic urothelial cancer(diagnosed in 10/2019), chronic anemia and history of DVT.  Assessment & Plan    1.  Acute on chronic combined heart failure with LVEF 20 to 25% by most recent assessment, also severe RV dysfunction.  He has diuresed substantially during hospital stay on IV Lasix.  He was transitioned to oral Demadex yesterday with approximately 1700 cc out more than in the last 24 hours.  Renal function remains stable and sodium level increasing.  2.  Aortic valve disease as discussed above, most recently with valve in valve TAVR and moderate paravalvular aortic regurgitation that is being managed conservatively at this point.  He is not a good candidate for reintervention.  3.  Pulmonary embolus, continues on Eliquis.  Hemoglobin stable.  4.  CAD status post BMS to the RCA in 2014, no active angina at this time.  5.  Metastatic urothelial carcinoma status post bilateral nephrostomy tubes in the setting of hydronephrosis and undergoing chemotherapy per Dr. Delton Coombes.  Continue Toprol-XL, decrease Demadex to 40 mg once daily.  He also  remains on Eliquis.  Hold off on resumption of ACE  inhibitor or Aldactone at this time.  Anticipate that he could be further managed as an outpatient from a cardiac perspective, conservative approach overall, and plan for hospice care after discharge.  Signed, Rozann Lesches, MD  05/29/2020, 8:54 AM

## 2020-05-29 NOTE — Discharge Summary (Addendum)
Physician Discharge Summary  [name] VXB:[id number] DOB: 1950/12/30 DOA: 05/16/2020  PCP: Bruce Helper, MD  Admit date: 05/16/2020 Discharge date: 05/30/2020  Admitted From: Home Discharge disposition: Home with home health, RN, PT, oxygen   Code Status: DNR  Diet Recommendation: Cardiac diet  Discharge Diagnosis:   Principal Problem:   Acute on chronic combined systolic and diastolic CHF (congestive heart failure) ([place]) Active Problems:   Essential hypertension, benign   Severe aortic insufficiency   Depression, major, single episode, mild ([name])   Port-A-Cath in place   Advanced care planning/counseling discussion   Metastatic malignant neoplasm ([name])   Chest pain   History of Present Illness / Brief narrative:  69 y.o. male with past medical history of CAD (s/p BMS to RCA in 2014), AVR(s/p bioprosthetic AVR and ultimately valve in valve TAVR in 09/2017 due to torncoronary cusp of his previously placed bioprosthetic AVR), chronic combined systolic and diastolic CHF (EF 33-00% by echo in 12/2019, at 20-25% by repeat echo in 04/2020),PAD (s/p left SFA intervention in 10/2018), HTN, HLD, recently diagnosed bilateral PE, metastatic urothelial cancer(diagnosed in 10/2019), chronic anemia and history of DVT. Patient presents to the ED on 8/26 with difficulty breathing and leg swelling. On admission BNP was elevated, chest x-ray showed right pleural effusion.  Cardiology was consulted, he was placed on IV Lasix.  Subjective:  Seen and examined this morning.  Present elderly African-American male.  Walking in the room.  Not in distress.  Seems to have dry skin but continues to have bilateral pitting pedal edema although shrinking.  Hospital Course:  Acute on chronic combined systolic and diastolic CHF  -EF 20 to 25 %, severe RV dysfunction.   -Cardiology consultation was obtained. -During his hospital stay, he has diuresed substantially with IV Lasix, net diuresis of  about 28 L  -Since yesterday, he is on oral Demadex.  Continues to diurese.  Renal function is stable, sodium level is more stabilized now. -Okay to discharge home on regimen per cardiology which include Demadex 40 mg daily along with low-dose Imdur and Toprol-XL at an increased dose.  Hyponatremia, acute on chronic  -Challenging to maintain sodium level in range while undergoing aggressive diuresis. -While in the hospital, his sodium level dropped down to 120s.  Previous hospitalist discussed with nephrologist.  For last 1 week, patient was given tolvaptan.  Sodium 127 today.   -At discharge, will continue to hold ACE inhibitor and Aldactone. -Patient will follow up with cardiology as an outpatient and will get repeat sodium level checked. -I also put in a referral for nephrology follow-up as an outpatient. Recent Labs  Lab 05/24/20 0842 05/25/20 0705 05/26/20 0725 05/27/20 0835 05/28/20 0431 05/29/20 0640 05/30/20 0608  NA 128*  128* 128* 124* 122* 122* 128* 127*   CAD status post BMS to the RCA in 2014, no active angina at this time.  History of AVR - recent echo showed moderate paravalvular leak involving the valve and valve TAVR. Managed conservatively, not a candidate for intervention  History of bilateral PEs - continue Eliquis  Metastatic urothelial carcinoma status post bilateral nephrostomy tubes for hydronephrosis - last chemotherapy on 05/09/2020, outpatient follow-up with Dr. Delton Coombes  Anemia of chronic disease - hemoglobin stable Recent Labs    05/09/20 0809 05/16/20 1324 05/18/20 0628 05/21/20 0644 05/22/20 0411 05/23/20 0532 05/24/20 0842 05/25/20 0705 05/26/20 0725 05/28/20 0431  HGB 10.6* 11.5* 11.4* 10.9* 10.8* 11.3* 11.1* 10.7* 11.6* 11.2*   Generalized deconditioning - PT  following, will need home health PT on discharge.  Patient also dropped her oxygen saturation on ambulation.  Home oxygen ordered.  Wound care: Incision (Closed) 04/17/19  Abdomen (Active)  Date First Assessed/Time First Assessed: 04/17/19 1016   Location: Abdomen    Assessments 04/17/2019 11:05 AM 04/17/2019  1:05 PM  Dressing Type Liquid skin adhesive Liquid skin adhesive  Dressing Clean;Dry;Intact Clean;Dry;Intact  Site / Wound Assessment Clean;Dry Clean;Dry  Margins Attached edges (approximated) Attached edges (approximated)  Closure Skin glue Skin glue  Drainage Amount None None  Drainage Description No odor No odor     No Linked orders to display     Incision (Closed) 04/17/19 Groin Anterior;Right (Active)  Date First Assessed/Time First Assessed: 04/17/19 1105   Location: Groin  Location Orientation: Anterior;Right    Assessments 04/17/2019 11:05 AM 04/17/2019  1:05 PM  Dressing Type Liquid skin adhesive Liquid skin adhesive  Dressing Clean;Intact;Dry Clean;Intact;Dry  Site / Wound Assessment Clean;Dry Clean;Dry  Margins Attached edges (approximated) Attached edges (approximated)  Closure Skin glue Skin glue  Drainage Amount None None  Drainage Description No odor No odor     No Linked orders to display     Incision (Closed) 04/17/19 Groin Anterior;Left (Active)  Date First Assessed/Time First Assessed: 04/17/19 1105   Location: Groin  Location Orientation: Anterior;Left    Assessments 04/17/2019 11:05 AM 04/17/2019  1:05 PM  Dressing Type Liquid skin adhesive Liquid skin adhesive  Dressing Clean;Dry;Intact Clean;Dry;Intact  Site / Wound Assessment Clean;Dry Clean;Dry  Margins Attached edges (approximated) Attached edges (approximated)  Closure Skin glue Skin glue  Drainage Amount None None  Drainage Description No odor No odor     No Linked orders to display     Incision (Closed) 11/21/19 Neck Left (Active)  Date First Assessed/Time First Assessed: 11/21/19 1112   Location: Neck  Location Orientation: Left    Assessments 11/21/2019 11:33 AM 11/21/2019 12:00 PM  Dressing Type Liquid skin adhesive Liquid skin adhesive  Dressing  Clean;Intact;Dry Clean;Dry;Intact  Site / Wound Assessment -- Clean;Dry  Margins Attached edges (approximated) Attached edges (approximated)  Drainage Amount None None     No Linked orders to display    Discharge Exam:   Vitals:   05/29/20 1758 05/29/20 2053 05/30/20 0402 05/30/20 0403  BP: 118/75 121/71  130/72  Pulse: 81 79  91  Resp: 16 16  16   Temp: 98.6 F (37 C) 98.2 F (36.8 C)  97.6 F (36.4 C)  TempSrc: Oral   Oral  SpO2: 100% 100%  98%  Weight:   53.9 kg   Height:        Body mass index is 20.4 kg/m.  General exam: Appears calm and comfortable.  Not in physical distress Skin: No rashes, lesions or ulcers. HEENT: Atraumatic, normocephalic, supple neck, no obvious bleeding Lungs: Clear to auscultation bilaterally CVS: Regular rate and rhythm, no murmur GI/Abd soft, nontender, nondistended, bowel sound present CNS: Alert, awake monitor x3 Psychiatry: Mood appropriate Extremities: Pedal edema trace to 1+ bilaterally  Follow ups:   Discharge Instructions    Ambulatory referral to Nephrology   Complete by: As directed    Diet - low sodium heart healthy   Complete by: As directed    Increase activity slowly   Complete by: As directed       Follow-up Information    Verta Ellen., NP Follow up on 06/04/2020.   Specialty: Cardiology Why: Cardiology Hospital Follow-up on 06/04/2020 at 1:30 PM. Will be at the  [place] office.  Contact information: [place] [address] [phone]        [name], [place] on 06/06/2020.   Specialty: Family Medicine Why: 2:40 appointment.  Contact information: [address] [place] [address] [phone]        Satira Sark, MD .   Specialty: Cardiology Contact information: [place] [address] [phone]        Donato Heinz, MD Follow up.   Specialty: Nephrology Contact information: [place]  [address] [phone]        [facility] Follow up.   Why: Will contact you to start home health services.  Contact information: [place] [address] [phone]        [facility] Follow up.   Contact information: [place] [phone]              Recommendations for Outpatient Follow-Up:   1. Follow-up with PCP as an outpatient 2. Follow-up with cardiology as an outpatient 3. Follow-up with nephrology as an outpatient  Discharge Instructions:  Follow with Primary MD Bruce Helper, MD in 7 days   Get CBC/BMP checked in next visit within 1 week by PCP or SNF MD ( we routinely change or add medications that can affect your baseline labs and fluid status, therefore we recommend that you get the mentioned basic workup next visit with your PCP, your PCP may decide not to get them or add new tests based on their clinical decision)  On your next visit with your PCP, please Get Medicines reviewed and adjusted.  Please request your PCP  to go over all Hospital Tests and Procedure/Radiological results at the follow up, please get all Hospital records sent to your Prim MD by signing hospital release before you go home.  Activity: As tolerated with Full fall precautions use walker/cane & assistance as needed  For Heart failure patients - Check your Weight same time everyday, if you gain over 2 pounds, or you develop in leg swelling, experience more shortness of breath or chest pain, call your Primary MD immediately. Follow Cardiac Low Salt Diet and 1.5 lit/day fluid restriction.  If you have smoked or chewed Tobacco in the last 2 yrs please stop smoking, stop any regular Alcohol  and or any Recreational drug use.  If you experience worsening of your admission symptoms, develop shortness of breath, life threatening emergency, suicidal or homicidal thoughts you must seek medical attention  immediately by calling 911 or calling your MD immediately  if symptoms less severe.  You Must read complete instructions/literature along with all the possible adverse reactions/side effects for all the Medicines you take and that have been prescribed to you. Take any new Medicines after you have completely understood and accpet all the possible adverse reactions/side effects.   Do not drive, operate heavy machinery, perform activities at heights, swimming or participation in water activities or provide baby sitting services if your were admitted for syncope or siezures until you have seen by Primary MD or a Neurologist and advised to do so again.  Do not drive when taking Pain medications.  Do not take more than prescribed Pain, Sleep and Anxiety Medications  Wear Seat belts while driving.   Please note You were cared for by a hospitalist during your hospital stay. If you have any questions about your discharge medications or  the care you received while you were in the hospital after you are discharged, you can call the unit and asked to speak with the hospitalist on call if the hospitalist that took care of you is not available. Once you are discharged, your primary care physician will handle any further medical issues. Please note that NO REFILLS for any discharge medications will be authorized once you are discharged, as it is imperative that you return to your primary care physician (or establish a relationship with a primary care physician if you do not have one) for your aftercare needs so that they can reassess your need for medications and monitor your lab values.    Allergies as of 05/30/2020   No Known Allergies     Medication List    STOP taking these medications   Adjustable Commode 3-in-1 Misc   furosemide 20 MG tablet Commonly known as: LASIX   lisinopril 5 MG tablet Commonly known as: ZESTRIL   senna-docusate 8.6-50 MG tablet Commonly known as: Senokot-S     spironolactone 25 MG tablet Commonly known as: ALDACTONE   trolamine salicylate 10 % cream Commonly known as: ASPERCREME     TAKE these medications   acetaminophen 325 MG tablet Commonly known as: TYLENOL Take 2 tablets (650 mg total) by mouth every 6 (six) hours as needed for mild pain or moderate pain. What changed: how much to take   aspirin EC 81 MG tablet Take 1 tablet (81 mg total) by mouth daily.   atorvastatin 40 MG tablet Commonly known as: LIPITOR Take 1 tablet (40 mg total) by mouth daily.   busPIRone 10 MG tablet Commonly known as: BUSPAR Take 1 tablet (10 mg total) by mouth 2 (two) times daily.   cholecalciferol 25 MCG (1000 UNIT) tablet Commonly known as: VITAMIN D Take 1 tablet (1,000 Units total) by mouth daily.   clonazePAM 0.5 MG tablet Commonly known as: KlonoPIN Take 1 tablet (0.5 mg total) by mouth at bedtime.   Eliquis DVT/PE Starter Pack Generic drug: Apixaban Starter Pack (10mg  and 5mg ) Take as directed on package: start with two-5mg  tablets twice daily for 7 days. On day 8, switch to one-5mg  tablet twice daily. What changed:   how much to take  how to take this  when to take this  additional instructions   escitalopram 10 MG tablet Commonly known as: LEXAPRO Take 1 tablet (10 mg total) by mouth daily.   fluticasone 50 MCG/ACT nasal spray Commonly known as: FLONASE Use 2 spray(s) in each nostril once daily   isosorbide mononitrate 30 MG 24 hr tablet Commonly known as: IMDUR Take 0.5 tablets (15 mg total) by mouth daily.   lidocaine-prilocaine cream Commonly known as: EMLA Apply 1 application topically as needed.   magnesium oxide 400 (241.3 Mg) MG tablet Commonly known as: MAG-OX Take 1 tablet (400 mg total) by mouth 2 (two) times daily.   metoprolol succinate 25 MG 24 hr tablet Commonly known as: TOPROL-XL Take 3 tablets (75 mg total) by mouth daily. Start taking on: May 31, 2020 What changed:   medication  strength  how much to take  how to take this  when to take this  additional instructions   montelukast 10 MG tablet Commonly known as: SINGULAIR Take 1 tablet (10 mg total) by mouth at bedtime.   nitroGLYCERIN 0.4 MG SL tablet Commonly known as: NITROSTAT Place 1 tablet (0.4 mg total) under the tongue every 5 (five) minutes as needed for chest pain.  oxyCODONE 5 MG immediate release tablet Commonly known as: Oxy IR/ROXICODONE Take 1 tablet (5 mg total) by mouth every 6 (six) hours as needed for up to 5 days for moderate pain or severe pain.   pantoprazole 40 MG tablet Commonly known as: Protonix Take 1 tablet (40 mg total) by mouth daily. What changed:   when to take this  Another medication with the same name was removed. Continue taking this medication, and follow the directions you see here.   prochlorperazine 10 MG tablet Commonly known as: COMPAZINE Take 10 mg by mouth every 6 (six) hours as needed for nausea or vomiting.   torsemide 20 MG tablet Commonly known as: DEMADEX Take 2 tablets (40 mg total) by mouth daily.            Durable Medical Equipment  (From admission, onward)         Start     Ordered   05/29/20 1255  For home use only DME oxygen  Once       Question Answer Comment  Length of Need Lifetime   Mode or (Route) Nasal cannula   Liters per Minute 2   Frequency Continuous (stationary and portable oxygen unit needed)   Oxygen conserving device Yes   Oxygen delivery system Gas      05/29/20 1254          Time coordinating discharge: 35 minutes  The results of significant diagnostics from this hospitalization (including imaging, microbiology, ancillary and laboratory) are listed below for reference.    Procedures and Diagnostic Studies:   DG Chest Port 1 View  Result Date: 05/16/2020 CLINICAL DATA:  Shortness of breath. EXAM: PORTABLE CHEST 1 VIEW COMPARISON:  April 21, 2020. FINDINGS: Stable cardiomegaly. No pneumothorax is  noted. Status post aortic valve repair. Right internal jugular Port-A-Cath is unchanged in position. Left lung is clear. Mild right pleural effusion is noted with associated right basilar atelectasis or infiltrate. Bony thorax is unremarkable. IMPRESSION: Mild right pleural effusion is noted with associated right basilar atelectasis or infiltrate. Electronically Signed   By: Marijo Conception M.D.   On: 05/16/2020 13:22     Labs:   Basic Metabolic Panel: Recent Labs  Lab 05/24/20 9381 05/24/20 0175 05/25/20 0705 05/25/20 0705 05/26/20 0725 05/26/20 0725 05/27/20 1025 05/27/20 8527 05/28/20 0431 05/29/20 0640 05/30/20 0608  NA 128*  128*   < > 128*   < > 124*  --  122*  --  122* 128* 127*  K 4.0  4.0   < > 3.8   < > 4.0   < > 3.8   < > 4.1 3.8  --   CL 88*  88*   < > 88*  --  84*  --  81*  --  82* 84*  --   CO2 27  27   < > 26  --  26  --  26  --  23 26  --   GLUCOSE 91  88   < > 96  --  94  --  86  --  71 77  --   BUN 19  19   < > 20  --  34*  --  38*  --  45* 39*  --   CREATININE 0.65  0.67   < > 0.64  --  0.79  --  0.81  --  0.88 0.78  --   CALCIUM 8.4*  8.4*   < > 8.5*  --  8.3*  --  8.5*  --  8.4* 8.8*  --   MG 1.8  1.8  --   --   --   --   --  1.8  --   --   --   --    < > = values in this interval not displayed.   GFR Estimated Creatinine Clearance: 67.4 mL/min (by C-G formula based on SCr of 0.78 mg/dL). Liver Function Tests: Recent Labs  Lab 05/24/20 0842 05/28/20 0431  AST 31 33  ALT 21 19  ALKPHOS 577* 660*  BILITOT 1.5* 1.9*  PROT 6.2* 6.2*  ALBUMIN 2.8* 2.9*   No results for input(s): LIPASE, AMYLASE in the last 168 hours. No results for input(s): AMMONIA in the last 168 hours. Coagulation profile No results for input(s): INR, PROTIME in the last 168 hours.  CBC: Recent Labs  Lab 05/24/20 0842 05/25/20 0705 05/26/20 0725 05/28/20 0431  WBC 9.0 9.7 9.3 9.4  HGB 11.1* 10.7* 11.6* 11.2*  HCT 34.7* 33.9* 36.4* 35.8*  MCV 85.7 86.7 85.4 87.1   PLT 113* 138* 156 142*   Cardiac Enzymes: No results for input(s): CKTOTAL, CKMB, CKMBINDEX, TROPONINI in the last 168 hours. BNP: Invalid input(s): POCBNP CBG: No results for input(s): GLUCAP in the last 168 hours. D-Dimer No results for input(s): DDIMER in the last 72 hours. Hgb A1c No results for input(s): HGBA1C in the last 72 hours. Lipid Profile No results for input(s): CHOL, HDL, LDLCALC, TRIG, CHOLHDL, LDLDIRECT in the last 72 hours. Thyroid function studies No results for input(s): TSH, T4TOTAL, T3FREE, THYROIDAB in the last 72 hours.  Invalid input(s): FREET3 Anemia work up No results for input(s): VITAMINB12, FOLATE, FERRITIN, TIBC, IRON, RETICCTPCT in the last 72 hours. Microbiology No results found for this or any previous visit (from the past 240 hour(s)).   Signed: Terrilee Croak  [facility] 05/30/2020, 2:01 PM

## 2020-05-29 NOTE — Care Management Important Message (Signed)
Important Message  Patient Details  Name: [name] MRN: [id number] Date of Birth: [birth year]   Medicare Important Message Given:  Yes     [name] 05/29/2020, 1:36 PM

## 2020-05-29 NOTE — Progress Notes (Signed)
SATURATION QUALIFICATIONS: (This note is used to comply with regulatory documentation for home oxygen)  Patient Saturations on Room Air at Rest = 100%  Patient Saturations on Room Air while Ambulating = 85% at lowest point  Patient Saturations on 2 Liters of oxygen while Ambulating = 100%  Please briefly explain why patient needs home oxygen: Pt qualifies for home O2, due to desaturation while ambulating. MD notified.

## 2020-05-29 NOTE — TOC Progression Note (Signed)
Transition of Care [facility]) - Progression Note    Patient Details  Name: ROBERTLEE ROGACKI MRN: [id number] Date of Birth: 1951-04-08  Transition [facility]) CM/SW Contact  Salome Arnt, [place] Phone Number: 05/29/2020, 3:49 PM  Clinical Narrative:  Pt d/c today. Pt will require home O2. Discussed with pt who is agreeable to referral to [facility]. Rodney with [facility] notified and delivered O2 to room in anticipation of d/c later today. LCSW notified Santiago Glad with [facility] and Anderson Malta with [facility] of d/c. Hospital follow up appointment made with PCP and appointment is on AVS.   This afternoon, pt complained of chest pain to RN. MD talked to pt and d/c order canceled. [facility], [facility], and [facility] notified. Will follow.       Expected Discharge Plan: [place] Barriers to Discharge: Continued Medical Work up  Expected Discharge Plan and Services Expected Discharge Plan: [place] In-house Referral: Clinical Social Work   Post Acute Care Choice: [place] arrangements for the past 2 months: [place], [place] Expected Discharge Date: 05/29/20                         HH Arranged: RN, PT HH Agency: [place] Date [facility] Agency Contacted: 05/17/20 Time [place]: [phone] Representative spoke with at [place]: [place] ([place]) Interventions    Readmission Risk Interventions Readmission Risk Prevention Plan 05/17/2020 04/22/2020 03/07/2020  Transportation Screening Complete Complete Complete  PCP or Specialist Appt within 3-5 Days - - Not Complete  Not Complete comments - - Assessment completed during second shift  [place] or [place] - - Complete  Social Work Consult for [place] Planning/Counseling - - Complete  Palliative Care Screening - - Not Applicable  Medication Review Press photographer) Complete Complete Complete   PCP or Specialist appointment within 3-5 days of discharge - Complete -  [place] or Home Care Consult Complete Complete -  SW Recovery Care/Counseling Consult Complete Complete -  Palliative Care Screening Not Applicable Complete -  [place] Not Applicable Not Applicable -  Some recent data might be hidden

## 2020-05-30 ENCOUNTER — Telehealth: Payer: Self-pay | Admitting: *Deleted

## 2020-05-30 LAB — SODIUM: Sodium: 127 mmol/L — ABNORMAL LOW (ref 135–145)

## 2020-05-30 MED ORDER — METOPROLOL SUCCINATE ER 50 MG PO TB24: 75.0000 mg | ORAL_TABLET | Freq: Every day | ORAL | Status: DC

## 2020-05-30 MED ORDER — OXYCODONE HCL 5 MG PO TABS
5.0000 mg | ORAL_TABLET | Freq: Four times a day (QID) | ORAL | 0 refills | Status: AC | PRN
Start: 2020-05-30 — End: 2020-06-04

## 2020-05-30 MED ORDER — PANTOPRAZOLE SODIUM 40 MG PO TBEC: 40.0000 mg | DELAYED_RELEASE_TABLET | Freq: Every day | ORAL | 0 refills | Status: AC

## 2020-05-30 MED ORDER — ISOSORBIDE MONONITRATE ER 30 MG PO TB24
15.0000 mg | ORAL_TABLET | Freq: Every day | ORAL | Status: DC
  Filled 2020-05-30: qty 1

## 2020-05-30 MED ORDER — ISOSORBIDE MONONITRATE ER 30 MG PO TB24: 15.0000 mg | ORAL_TABLET | Freq: Every day | ORAL | 0 refills | Status: AC

## 2020-05-30 MED ORDER — METOPROLOL SUCCINATE ER 25 MG PO TB24: 75.0000 mg | ORAL_TABLET | Freq: Every day | ORAL | 0 refills | Status: AC

## 2020-05-30 NOTE — Progress Notes (Signed)
Initial Nutrition Assessment  DOCUMENTATION CODES:   Not applicable  INTERVENTION:  Ensure Enlive po BID, each supplement provides 350 kcal and 20 grams of protein  -Unable to provide at this time, orders have been reconciled for discharge   NUTRITION DIAGNOSIS:   Increased nutrient needs related to chronic illness (chronic combined CHF; metastatic urothelial cancer s/p chemotherapy x 2 cycles) as evidenced by estimated needs.  GOAL:   Patient will meet greater than or equal to 90% of their needs    MONITOR:   Labs, I & O's, PO intake, Weight trends, Supplement acceptance  REASON FOR ASSESSMENT:   LOS    ASSESSMENT:  RD working remotely.  69 year old male with history significant for chronic combined CHF, (EF 25-30%), CAD s/p stent, Aortic valve replacement, PAD, HTN, EtOH abuse, DVT, left eye blindness, metastatic urothelial cancer s/p bilateral nephrostomy tubes, currently undergoing chemotherapy, and recent hospital admission 8/1-8/5 due to bilateral pulmonary embolism and UTI presented with difficulty breathing and bilateral lower extremity swelling.  Patient admitted on 8/26 with acute on chronic combined systolic and diastolic CHF.   0/17 - last chemo (cycle 2: gemcitabine/carboplatin) follwed by Dr. Delton Coombes.  -Chemotherapy held s/p 2 recurrent hospitalizations after completing cycle 2. Per chart, plans to start immunotherapy one physical condition improves.   RD attempted to contact pt via inpt room phone this morning, however no answer and unable to obtain nutrition history at this time. He has been eating fairly well during admission, per flowsheets 50-100% (79% average) of the last 7 documented meals. Patient is at increased risk for malnutrition given chronic combined CHF as well as metastatic urothelial cancer, will order Ensure Enlive BID to aid with meeting needs. He is not currently being followed by outpt nutrition at cancer center. Will contact RD at  cancer center to request outpatient nutrition assessment as able.   Admit wt 63.5 kg     Current wt 53.9 kg   Net: -31,435 ml since admit UOP: 3000 ml x 24 hrs  Medications reviewed and include: Eliquis, Imdur, Demadex 40mg  daily Labs: Na 127 (L), Hgb 11.2 (L), HCT 11.2 (L)  NUTRITION - FOCUSED PHYSICAL EXAM: Unable to complete at this time, RD working remotely.   Diet Order:   Diet Order            Diet - low sodium heart healthy           Diet Heart Room service appropriate? Yes; Fluid consistency: Thin; Fluid restriction: 1200 mL Fluid  Diet effective now                 EDUCATION NEEDS:   Not appropriate for education at this time  Skin:  Skin Assessment: Reviewed RN Assessment  Last BM:  9/8  Height:   Ht Readings from Last 1 Encounters:  05/16/20 5\' 4"  (1.626 m)    Weight:   Wt Readings from Last 1 Encounters:  05/30/20 53.9 kg    Ideal Body Weight:  59.1 kg  BMI:  Body mass index is 20.4 kg/m.  Estimated Nutritional Needs:   Kcal:  1700-1900  Protein:  85-95  Fluid:  1.2 L/day per MD   Lajuan Lines, RD, LDN Clinical Nutrition After Hours/Weekend Pager # in [place]

## 2020-05-30 NOTE — TOC Transition Note (Signed)
Transition of Care [facility]) - CM/SW Discharge Note   Patient Details  Name: [name] MRN: [id number] Date of Birth: 06/05/1951  Transition of Care [facility]) CM/SW Contact:  Salome Arnt, LCSW Phone Number: 05/30/2020, 11:39 AM   Clinical Narrative:  Pt d/c today. [place] for palliative services, and [facility] notified of d/c. Pt's oxygen was delivered yesterday. Pt states he will have to work on finding a ride. LCSW agreed to check back soon.       Final next level of care: Skilled Nursing Facility Barriers to Discharge: Barriers Resolved   Patient Goals and CMS Choice Patient states their goals for this hospitalization and ongoing recovery are:: Return home      Discharge Placement                  Name of family member notified: pt only Patient and family notified of of transfer: 05/30/20  Discharge Plan and Services In-house Referral: Clinical Social Work   Post Acute Care Choice: Home Health                    HH Arranged: RN, PT [facility] Agency: [place] Date [place]: 05/30/20 Time [place]: 1138 Representative spoke with at [place]: [place] Determinants of Health ([place]) Interventions     Readmission Risk Interventions Readmission Risk Prevention Plan 05/29/2020 05/17/2020 04/22/2020  Transportation Screening - Complete Complete  PCP or Specialist Appt within 3-5 Days - - -  Not Complete comments - - -  [place] or [place] Work Consult for [place] Planning/Counseling - - -  Palliative Care Screening - - -  Medication Review Press photographer) - Complete Complete  PCP or Specialist appointment within 3-5 days of discharge Complete - Complete  [place] or [place] - Complete Complete  SW Recovery Care/Counseling Consult - Complete Complete  Palliative Care Screening - Not Applicable Complete  [place] - Not Applicable Not  Applicable  Some recent data might be hidden

## 2020-05-30 NOTE — Progress Notes (Signed)
Progress Note  Patient Name: [name] Date of Encounter: 05/30/2020  Primary Cardiologist: Rozann Lesches, MD  Subjective   Sitting in bedside chair eating breakfast.  No chest pain this morning.  I asked him about the reported episode yesterday afternoon, he describes a pressure sensation that is intermittent, not new.  Also states that he was upset about going home yesterday.  Inpatient Medications    Scheduled Meds: . apixaban  5 mg Oral BID  . Chlorhexidine Gluconate Cloth  6 each Topical Daily  . metoprolol succinate  50 mg Oral Daily  . tolvaptan  15 mg Oral Q24H  . torsemide  40 mg Oral Daily    PRN Meds: acetaminophen, ALPRAZolam, methocarbamol, morphine injection, nitroGLYCERIN, ondansetron (ZOFRAN) IV, oxyCODONE, sodium chloride, sodium chloride flush   Vital Signs    Vitals:   05/29/20 1758 05/29/20 2053 05/30/20 0402 05/30/20 0403  BP: 118/75 121/71  130/72  Pulse: 81 79  91  Resp: 16 16  16   Temp: 98.6 F (37 C) 98.2 F (36.8 C)  97.6 F (36.4 C)  TempSrc: Oral   Oral  SpO2: 100% 100%  98%  Weight:   53.9 kg   Height:        Intake/Output Summary (Last 24 hours) at 05/30/2020 0922 Last data filed at 05/30/2020 0700 Gross per 24 hour  Intake 240 ml  Output 2700 ml  Net -2460 ml   Filed Weights   05/28/20 0500 05/29/20 0500 05/30/20 0402  Weight: 55.7 kg 53.3 kg 53.9 kg    Telemetry    Sinus rhythm.  Personally reviewed.  ECG    No ECG reviewed.  Physical Exam   GEN:  Chronically ill-appearing, cachectic.   Neck: No JVD. Cardiac: RRR, 2/6 systolic and 3/6 diastolic murmurs.  Respiratory:  Decreased breath sounds. GI: Soft, nontender, bowel sounds present. MS:  Persistent leg edema, left greater than right; No deformity.  Labs    Chemistry Recent Labs  Lab 05/24/20 [phone] 05/25/20 0705 05/27/20 0835 05/27/20 0835 05/28/20 0431 05/29/20 0640 05/30/20 0608  NA 128*  128*   < > 122*   < > 122* 128* 127*  K 4.0  4.0   < >  3.8  --  4.1 3.8  --   CL 88*  88*   < > 81*  --  82* 84*  --   CO2 27  27   < > 26  --  23 26  --   GLUCOSE 91  88   < > 86  --  71 77  --   BUN 19  19   < > 38*  --  45* 39*  --   CREATININE 0.65  0.67   < > 0.81  --  0.88 0.78  --   CALCIUM 8.4*  8.4*   < > 8.5*  --  8.4* 8.8*  --   PROT 6.2*  --   --   --  6.2*  --   --   ALBUMIN 2.8*  --   --   --  2.9*  --   --   AST 31  --   --   --  33  --   --   ALT 21  --   --   --  19  --   --   ALKPHOS 577*  --   --   --  660*  --   --   BILITOT 1.5*  --   --   --  1.9*  --   --   GFRNONAA >60  >60   < > >60  --  >60 >60  --   GFRAA >60  >60   < > >60  --  >60 >60  --   ANIONGAP 13  13   < > 15  --  17* 18*  --    < > = values in this interval not displayed.     Hematology Recent Labs  Lab 05/25/20 0705 05/26/20 0725 05/28/20 0431  WBC 9.7 9.3 9.4  RBC 3.91* 4.26 4.11*  HGB 10.7* 11.6* 11.2*  HCT 33.9* 36.4* 35.8*  MCV 86.7 85.4 87.1  MCH 27.4 27.2 27.3  MCHC 31.6 31.9 31.3  RDW 23.1* 22.5* 22.1*  PLT 138* 156 142*    Cardiac Enzymes Recent Labs  Lab 05/17/20 0225 05/25/20 0705 05/25/20 1324 05/27/20 0835 05/27/20 1044  TROPONINIHS 61* 42* 48* 34* 36*    Radiology    No results found.  Cardiac Studies   Echocardiogram 04/23/2020: 1. Left ventricular ejection fraction, by estimation, is 25 to 30%. The  left ventricle has severely decreased function. The left ventricle  demonstrates global hypokinesis. The left ventricular internal cavity size  was moderately dilated. There is mild  left ventricular hypertrophy. Left ventricular diastolic parameters are  indeterminate.  2. Right ventricular systolic function is severely reduced. The right  ventricular size is mildly enlarged. There is moderately elevated  pulmonary artery systolic pressure. The estimated right ventricular  systolic pressure is 80.9 mmHg.  3. Left atrial size was severely dilated.  4. Right atrial size was severely dilated.  5.  The mitral valve is abnormal. Mild to moderate mitral valve  regurgitation.  6. The aortic valve has been repaired/replaced. Aortic valve  regurgitation is trivial. There is a 26 mm Edwards Sapien prosthetic  (TAVR) valve present in the aortic position (patient has valve in valve  TAVR with previous bioprosthetic AVR). Aortic valve  mean gradient measures 8.0 mmHg. There is a moderate paravalvular leak at  9 o'clock position.  7. The inferior vena cava is dilated in size with <50% respiratory  variability, suggesting right atrial pressure of 15 mmHg.   Patient Profile     69 y.o. male with past medical history of CAD (s/p BMS to RCA in 2014), AVR(s/p bioprosthetic AVR and ultimately valve in valve TAVR in 09/2017 due to torncoronary cusp of his previously placed bioprosthetic AVR), chronic combined systolic and diastolic CHF (EF 98-33% by echo in 12/2019, at 20-25% by repeat echo in 04/2020),PAD (s/p left SFA intervention in 10/2018), HTN, HLD, recently diagnosed bilateral PE, metastatic urothelial cancer(diagnosed in 10/2019), chronic anemia and history of DVT.  Assessment & Plan    1.  Acute on chronic combined heart failure.  LVEF 20 to 25% with severe RV dysfunction as well.  He continues to diurese on oral Demadex, 2700 cc net output last 24 hours. Last creatinine 0.78 and current sodium relatively stable at 127.  2. Complex aortic valve disease as discussed above, most recently with valve in valve TAVR and with known moderate paravalvular aortic regurgitation that is being managed conservatively. He is not a candidate for follow-up invasive intervention.  3. Pulmonary embolus, he remains on Eliquis.  4. CAD status post BMS to the RCA in 2014. He does report intermittent angina symptoms, although plan remains medical therapy at this point.  5. Metastatic urothelial carcinoma status post bilateral nephrostomy tubes in the setting of hydronephrosis and with ongoing  chemotherapy.  Patient seems frustrated overall, there are barriers to discharge with limited resources as an outpatient and lack of transportation. Hospice has already been consulted for management. He is not a candidate for aggressive cardiac evaluation and would therefore continue with medical therapy. Continue Eliquis, uptitrate Toprol-XL to 75 mg daily, continue Demadex, also initiate low-dose Imdur. We will continue to follow.  Signed, Rozann Lesches, MD  05/30/2020, 9:22 AM

## 2020-05-30 NOTE — Telephone Encounter (Signed)
Denise from [facility] called needing verbal orders for pallative as pt is being discharged from hospital. Verbal orders given. Any questions please call [phone] option 2.

## 2020-05-30 NOTE — Progress Notes (Signed)
Briefly seen and examined this morning. Hemodynamically stable. Patient was set for discharge to home yesterday. However he was anxious about leaving and stated he had chest pain I had a long conversation with him this morning. He does not have any chest pain or shortness of breath at this time. He still is anxious about having those symptoms after he leaves.  I do not see any further need of inpatient care. We will discharge him today.

## 2020-05-30 NOTE — Clinical Social Work Note (Signed)
[employer] unable to pick up patient due to being down a cab. CSW left voicemail for patient's sister, Alejandro Mulling, to request that patient be picked up. CSW updated by RN patient's sister picked him up.  Tobi Bastos, LCSW Transitions of Care Clinical Social Worker [facility] Ph: [phone]

## 2020-05-31 ENCOUNTER — Other Ambulatory Visit: Payer: Self-pay | Admitting: *Deleted

## 2020-05-31 ENCOUNTER — Other Ambulatory Visit (HOSPITAL_COMMUNITY): Payer: Medicare Other

## 2020-05-31 DIAGNOSIS — R7401 Elevation of levels of liver transaminase levels: Secondary | ICD-10-CM | POA: Diagnosis not present

## 2020-05-31 DIAGNOSIS — I509 Heart failure, unspecified: Secondary | ICD-10-CM | POA: Diagnosis not present

## 2020-05-31 DIAGNOSIS — N1330 Unspecified hydronephrosis: Secondary | ICD-10-CM | POA: Diagnosis not present

## 2020-05-31 DIAGNOSIS — E871 Hypo-osmolality and hyponatremia: Secondary | ICD-10-CM | POA: Diagnosis not present

## 2020-05-31 NOTE — Patient Outreach (Signed)
[name]) Care Management  05/31/2020  [name] [birth year] [id number]   Member's case was discussed during multidisciplinary discussion on 9/9 as he has had multiple readmissions in the last 30 days.  He remained hospitalized at that time, discharged home yesterday.  [employer] representative will refer him to oncology support program.  Call placed to member to follow up on discharge, he state he is "doing."  Report he is still having intermittent shortness of breath but is better then it was previously since being placed on home oxygen.  He has order for home health for PT/OT/RN and aide through [facility], waiting on call to schedule initial visit.  He will also be active with [facility] for palliative care, waiting on call from them as well.  Advised to be patient as it has been less than 24 hours since his discharge.  He has his sister and brother in law in the home now to assist him as needed.  He has follow up appointments with cardiology on 9/14 and with PCP on 9/16.  State he will use his usual transportation service to get to these appointments.  Denies any urgent concerns at this time, verbalizes understanding of HF management/zones as well as complications of bladder cancer and treatments.  Encouraged to contact this care manager with questions.  Will follow up within the next week.  Goals Addressed              This Visit's Progress   .  THN: Get stronger and Stay out of the hospital (pt-stated)        [place] (see longtitudinal plan of care for additional care plan information)   Current Barriers:  [name] Knowledge deficit related to basic heart failure pathophysiology and self care management . Knowledge Deficits related to heart failure medications  Case Manager Clinical Goal(s):  [name] Over the next 31 days, patient will verbalize understanding of Heart Failure Action Plan and when to call doctor . Over the next 28 days, patient will weigh daily and  record (notifying MD of 3 lb weight gain over night or 5 lb in a week) . Patient will verbalize two symptoms of CHF exacerbation within the next 28 days.    Interventions:  . Basic overview and discussion of pathophysiology of Heart Failure reviewed  . Assessed need for readable accurate scales in home . Provided education about placing scale on hard, flat surface . Advised patient to weigh each morning after emptying bladder  Patient Self Care Activities:  . Takes Heart Failure Medications as prescribed . Weighs daily and record (notifying MD of 3 lb weight gain over night or 5 lb in a week) . Verbalizes understanding of and follows CHF Action Plan  Initial goal documentation       [name], RN, MSN [place] [phone]

## 2020-06-04 ENCOUNTER — Ambulatory Visit: Payer: Medicare Other | Admitting: Family Medicine

## 2020-06-06 ENCOUNTER — Inpatient Hospital Stay: Payer: Medicare Other | Admitting: Family Medicine

## 2020-06-07 ENCOUNTER — Other Ambulatory Visit: Payer: Self-pay | Admitting: *Deleted

## 2020-06-07 NOTE — Patient Outreach (Signed)
[facility]) Care Management  06/07/2020  [name] 1951-08-27 [id number]   Call placed to member to complete initial assessment as well as follow up on ongoing recovery from hospitalization.  No answer, HIPAA compliant voice message left.  Will follow up within the next 3-4 business days.  [name], [place], MSN [place] [phone]

## 2020-06-10 ENCOUNTER — Telehealth: Payer: Self-pay | Admitting: Family Medicine

## 2020-06-10 NOTE — Telephone Encounter (Signed)
Contacted pt to see how he was doing as I was signing forms on his behalf States " a little rough' but thankful he FINALLY got his oxygen  Has 9/29 appt with Jarrett Soho, will call if he needs [place] before

## 2020-06-11 DIAGNOSIS — I110 Hypertensive heart disease with heart failure: Secondary | ICD-10-CM | POA: Diagnosis not present

## 2020-06-11 DIAGNOSIS — M1990 Unspecified osteoarthritis, unspecified site: Secondary | ICD-10-CM

## 2020-06-11 DIAGNOSIS — C7919 Secondary malignant neoplasm of other urinary organs: Secondary | ICD-10-CM | POA: Diagnosis not present

## 2020-06-11 DIAGNOSIS — Z436 Encounter for attention to other artificial openings of urinary tract: Secondary | ICD-10-CM

## 2020-06-11 DIAGNOSIS — I2510 Atherosclerotic heart disease of native coronary artery without angina pectoris: Secondary | ICD-10-CM | POA: Diagnosis not present

## 2020-06-11 DIAGNOSIS — I739 Peripheral vascular disease, unspecified: Secondary | ICD-10-CM

## 2020-06-11 DIAGNOSIS — I50812 Chronic right heart failure: Secondary | ICD-10-CM | POA: Diagnosis not present

## 2020-06-11 DIAGNOSIS — I2694 Multiple subsegmental pulmonary emboli without acute cor pulmonale: Secondary | ICD-10-CM

## 2020-06-11 DIAGNOSIS — C7951 Secondary malignant neoplasm of bone: Secondary | ICD-10-CM

## 2020-06-11 DIAGNOSIS — I5032 Chronic diastolic (congestive) heart failure: Secondary | ICD-10-CM | POA: Diagnosis not present

## 2020-06-12 ENCOUNTER — Telehealth: Payer: Self-pay | Admitting: Cardiology

## 2020-06-12 ENCOUNTER — Other Ambulatory Visit: Payer: Medicare Other | Admitting: Nurse Practitioner

## 2020-06-12 ENCOUNTER — Other Ambulatory Visit: Payer: Self-pay | Admitting: *Deleted

## 2020-06-12 ENCOUNTER — Telehealth: Payer: Self-pay | Admitting: Nurse Practitioner

## 2020-06-12 ENCOUNTER — Other Ambulatory Visit: Payer: Self-pay

## 2020-06-12 DIAGNOSIS — Z515 Encounter for palliative care: Secondary | ICD-10-CM

## 2020-06-12 DIAGNOSIS — C801 Malignant (primary) neoplasm, unspecified: Secondary | ICD-10-CM

## 2020-06-12 NOTE — Telephone Encounter (Signed)
Spoke with patient regarding Palliative referral/services and he was in agreement with this.  I have scheduled an In-person Consult for 06/12/20 @ 2:30 PM

## 2020-06-12 NOTE — Telephone Encounter (Signed)
Appointment time was changed to 1 PM

## 2020-06-12 NOTE — Telephone Encounter (Signed)
New message     Patient wants a call back from Dr Domenic Polite he said that Dr Domenic Polite is suppose to be working with him to get portable oxygen

## 2020-06-12 NOTE — Patient Outreach (Signed)
[facility]) Care Management  06/12/2020  [name] [birth year] [id number]   Call placed to member to follow up on management of conditions and hospital recovery.  He report he remains weak, not having any energy.  He has multiple appointments (PCP, IR/Oncology, Oncology infusion, and urology) this week as well as next, state he is going to cancel all of them as he does not feel up to attending.  He does report having home visits today with [facility] (palliative care referral) and home health.  State he does feel better since being on the home O2.    He report his sister and brother in law are still helping him as much as they can but he feel he still need additional assistance.  This care manager inquired about Medicaid and possibility of PCS, he is unsure if he has this benefit.  Inquired about ability to prepare meals, he denies being able to do this.  Agrees to have follow up from [place] for resources.    Denies any urgent concerns at this time. Will place referral to [place].  Will follow up within the next 2 weeks.  Goals Addressed              This Visit's Progress   .  THN: Get stronger and Stay out of the hospital (pt-stated)   On track     [place] (see longtitudinal plan of care for additional care plan information)   Current Barriers:  [name] Knowledge deficit related to basic heart failure pathophysiology and self care management . Knowledge Deficits related to heart failure medications  Case Manager Clinical Goal(s):  [name] Over the next 31 days, patient will verbalize understanding of Heart Failure Action Plan and when to call doctor . Over the next 28 days, patient will weigh daily and record (notifying MD of 3 lb weight gain over night or 5 lb in a week) . Patient will verbalize two symptoms of CHF exacerbation within the next 28 days.    Interventions:  . Basic overview and discussion of pathophysiology of Heart Failure reviewed  . Assessed need  for readable accurate scales in home . Provided education about placing scale on hard, flat surface . Advised patient to weigh each morning after emptying bladder  Patient Self Care Activities:  . Takes Heart Failure Medications as prescribed . Weighs daily and record (notifying MD of 3 lb weight gain over night or 5 lb in a week) . Verbalizes understanding of and follows CHF Action Plan  Initial goal documentation       [name], RN, MSN [place] [phone]

## 2020-06-12 NOTE — Progress Notes (Addendum)
[facility] [facility] Palliative Care Consult Note Telephone: [phone]  Fax: [phone]  Addendum 06/13/2020 Received referral update from Katheren Puller (Palliative care SW). Patient has been referred for meal services with [employer] on wheels. Service to begin next week.     PATIENT NAME: [name] P.o. [place] [address] [phone] (home)  DOB: Jun 10, 1951 MRN: [id number]  PRIMARY CARE PROVIDER:    [name], MD,  [address] [place] [address] [phone]  REFERRING PROVIDER:   [name], MD [address], [place],  [place] [address] [phone]  RESPONSIBLE PARTY:   Extended Emergency Contact Information Primary Emergency Contact: [place] Mobile Phone: [phone] Relation: Sister Secondary Emergency Contact: [place] Phone: [phone] Relation: Relative  I met face to face with patient and family in home/facility.  ASSESSMENT AND RECOMMENDATIONS:   1. Advance Care Planning/Goals of Care:  Goal of care: Patient's goal of care is comfort. Patient verbalized desire to be pain free, saying " I'm ready to go home to my father".  verbalized desire to not pursue any more treatments. Patient denied suicide or homicidal ideation. Emotional support provided. Directives: Patient's code status is DNR. Patient does not want to be resuscitated in the event of cardiac or respiratory arrest. MOST form completed with patient, details includes: DNR, comfort measures, determine use or limitations of antibiotics when infection occurs, IV fluids for defined trial period, feeding tube for a defined trial period. Completed DNR and MOST form left with patient with instruction to place the DNR for in a readily accessible area, copy uploaded to [place].  2. Symptom Management: Patient with metastatic urothelial cancer s/p bilateral nephrostomy tubes, advanced CHF (EF 20-25%) on  continuous supplemental oxygen. Patient with frequent hospitalizations, 5 hospitalizations in the last 5 months. Last hospitalization 05/16/20 to 05/30/2020 where he was diuresed about 28L of fluid per hospital report, discharged home with PT, home health nursing services with [facility] agency. Patient has severe activity intolerance, dyspnea with slight activity, 4+ edema to bilateral lower legs up to his lower abdomen. He is on Demadex 28m daily, report inability to take his meds due to dyspnea. Patient last chemo was 05/09/2020, verbalized desire to not pursuing any more treatments.  Wound care: Patient with bilateral nephrostomy tubes, yet to have visit with home care nursing for dressing changes to insertion sites. [facility] Spoke with RApolonio Schneiders agency report unawareness of patient's hospital discharge. Notified them of patient discharge and his urgent need for wound care.  Nutrition: Patient with difficulty obtaining food. I will refer patient to social worker for assistance.    Pain: Report 8/10 pain in right leg and pelvis. Current pain regimen includes Oxycodone 526mevery 4hrs as needed and Tylenol 650 mg every 6hrs as needed for mild pain. Patient not taking pain med yet today. Encouraged to take med to prevent worsening pain.  3. Follow up Palliative Care Visit: Palliative care will continue to provide support to patient, family and the medical team.  Return 2 weeks or prn.  4. Family /Caregiver/Community Supports: Patient lives alone. Brother in-law and friends helps him at times. Patient's sister lives close to him, his two children lives out of town, patient not in communication with his children.   5. Cognitive / Functional decline: Patient alert and oriented x 4. Patient deconditioned, has severe activity intolerance. Requires several stops and rest with all  activities including eating and taking medication.   I spent 90 minutes providing this  consultation, includes time spent with patient, chart review, provider coordination, and documentation. More than 50% of the time in this consultation was spent coordinating communication.   HISTORY OF PRESENT ILLNESS:  [name] is a 69 y.o. year old male with multiple medical problems including metastatic urotheilal carcinoma s/p bilateral nephrostomy tubes, systolic and diastolic CHF (65-99%), PVD, severe aortic insufficiency s/p TAVR, chronic anemia, history of PE, depression. Palliative Care was asked to follow this patient by consultation request of [name], MD to help address advance care planning and goals of care. This is a follow up visit.  CODE STATUS: DNR  PPS: 40%  HOSPICE ELIGIBILITY/DIAGNOSIS: Patient deemed eligible for Hospice care due to extensive disease. Pending formal referral from his PCP Dr. Tula Nakayama.  PAST MEDICAL HISTORY:  Past Medical History:  Diagnosis Date  . Arterial embolus of lower extremity ([place]) 11/23/2013  . Arthritis   . CAD (coronary artery disease)    a. s/p PCI with 3.5 x 18 BMS to mid RCA, non obst LAD, Lcx patent 08/10/13  . Cancer ([place]) 2021   bladder  . Depression, major, single episode, mild ([place]) 12/20/2019  . ED (erectile dysfunction) 03/27/2015  . Embolus of femoral artery [facility]) 2012   Surgical repair, previously on Coumadin  . Essential hypertension   . ETOH abuse   . Hematuria 10/17/2019  . Hernia of abdominal cavity 02/27/2019  . History of DVT (deep vein thrombosis) 2011  . History of GI bleed    February 2012   . Ingrown left greater toenail 09/04/2018  . 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 Low oxygen saturation 01/31/2020  . 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 Pain with urination 10/17/2019  . Port-A-Cath in place 02/23/2020   right  . Prosthetic valve endocarditis ([place])   . PVD (peripheral vascular disease) ([place])   . S/P  aortic valve replacement    Details not clear -  reportedly 1971 per patient    SOCIAL HX:  Social History   Tobacco Use  . Smoking status: Former Smoker    Packs/day: 0.50    Years: 3.00    Pack years: 1.50    Types: Cigarettes    Start date: 10/12/1965    Quit date: 02/19/1970    Years since quitting: 50.3  . Smokeless tobacco: Never Used  Substance Use Topics  . Alcohol use: Not Currently   FAMILY HX:  Family History  Problem Relation Age of Onset  . Diabetes Mother   . Cirrhosis Father   . COPD Sister     ALLERGIES: No Known Allergies   PERTINENT MEDICATIONS:  Outpatient Encounter Medications as of 06/12/2020  Medication Sig  . acetaminophen (TYLENOL) 325 MG tablet Take 2 tablets (650 mg total) by mouth every 6 (six) hours as needed for mild pain or moderate pain. (Patient taking differently: Take 325 mg by mouth every 6 (six) hours as needed for mild pain or moderate pain. )  . Apixaban Starter Pack, 67m and 574m (ELIQUIS DVT/PE STARTER PACK) Take as directed on package: start with two-44m7mablets twice daily for 7 days. On day 8, switch to one-44mg344mblet twice daily. (Patient taking differently: Take 5 mg by mouth in the morning and at bedtime. )  . aspirin EC 81 MG tablet Take 1  tablet (81 mg total) by mouth daily.  [name] atorvastatin (LIPITOR) 40 MG tablet Take 1 tablet (40 mg total) by mouth daily.  . busPIRone (BUSPAR) 10 MG tablet Take 1 tablet (10 mg total) by mouth 2 (two) times daily.  . cholecalciferol (VITAMIN D) 1000 units tablet Take 1 tablet (1,000 Units total) by mouth daily.  . clonazePAM (KLONOPIN) 0.5 MG tablet Take 1 tablet (0.5 mg total) by mouth at bedtime.  [name] escitalopram (LEXAPRO) 10 MG tablet Take 1 tablet (10 mg total) by mouth daily.  . fluticasone (FLONASE) 50 MCG/ACT nasal spray Use 2 spray(s) in each nostril once daily  . isosorbide mononitrate (IMDUR) 30 MG 24 hr tablet Take 0.5 tablets (15 mg total) by mouth daily.  [name] lidocaine-prilocaine (EMLA)  cream Apply 1 application topically as needed.  . magnesium oxide (MAG-OX) 400 (241.3 Mg) MG tablet Take 1 tablet (400 mg total) by mouth 2 (two) times daily.  . metoprolol succinate (TOPROL-XL) 25 MG 24 hr tablet Take 3 tablets (75 mg total) by mouth daily.  . montelukast (SINGULAIR) 10 MG tablet Take 1 tablet (10 mg total) by mouth at bedtime.  . nitroGLYCERIN (NITROSTAT) 0.4 MG SL tablet Place 1 tablet (0.4 mg total) under the tongue every 5 (five) minutes as needed for chest pain.  . pantoprazole (PROTONIX) 40 MG tablet Take 1 tablet (40 mg total) by mouth daily.  . prochlorperazine (COMPAZINE) 10 MG tablet Take 10 mg by mouth every 6 (six) hours as needed for nausea or vomiting.  . torsemide (DEMADEX) 20 MG tablet Take 2 tablets (40 mg total) by mouth daily.   No facility-administered encounter medications on file as of 06/12/2020.    PHYSICAL EXAM / ROS:   Current and past weights: 118lbs, Ht 58f", BMI 20.3kg/m2 General: ill and frail appearing, thin, sitting in chair in tripod position, speaks in shorts sentences Cardiovascular: denied chest pain, 4+ edema to BLE Pulmonary: no cough, dyspneic with speech, on 3L oxygen via nasal canula  Abdomen: appetite poor, denied constipation, continent of bowel GU: Bilateral nephrostomy tubes, urine clear and yellow MSK: no joint and ROM abnormalities Skin: scratch marks on bilateral arms, dressing on site of nephrostomy tube peeling with drainage marks. Neurological: weakness, but otherwise non-focal   QJari Favre DNP, AGPCNP-BC

## 2020-06-12 NOTE — Telephone Encounter (Signed)
Advised patient that oxygen request needed to be coordinated with his PCP. Verbalized understanding.

## 2020-06-13 ENCOUNTER — Telehealth: Payer: Self-pay

## 2020-06-13 ENCOUNTER — Other Ambulatory Visit (HOSPITAL_COMMUNITY): Payer: Medicare Other

## 2020-06-13 ENCOUNTER — Telehealth: Payer: Self-pay | Admitting: *Deleted

## 2020-06-13 NOTE — Telephone Encounter (Signed)
Are you able to provide requested documentation , if not please let me know, thanks

## 2020-06-13 NOTE — Telephone Encounter (Signed)
Please order this and give my last note. I have not seen him in some time unfortunately.

## 2020-06-13 NOTE — Telephone Encounter (Signed)
Palliative care SW completed a meals on wheels referral for patient to [facility] on [address] [facility], [phone]).

## 2020-06-13 NOTE — Progress Notes (Deleted)
Cardiology Office Note  Date: 06/13/2020   ID: Sheldon, Sem 21-Jan-1951, MRN [id number]  PCP:  [name], MD  Cardiologist:  Rozann Lesches, MD Electrophysiologist:  None   Chief Complaint: Aortic valve disease  History of Present Illness: [name] is a 69 y.o. male with a history of aortic valve disease.  Status post bioprosthetic aortic valve replacement ultimately valve in valve TAVR due to severe aortic regurgitation in setting of torn prosthetic cup.  Last encounter with Dr. Domenic Polite on 01/11/2020 via telemedicine.  His LVEF was stable in the range of 45 to 50% by follow-up echo.  He was continuing Toprol-XL, lisinopril and Lasix.  Echocardiogram also showed stable valve gradient with moderate perivalvular regurgitation.  Previous BMS to RCA in 2014.  He was continuing DAPT therapy and Lipitor.  Recent LDL was 57.   History of metastatic urothelial carcinoma status post bilateral nephrostomy tubes, systolic and diastolic CHF with EF of 20 to 25%, PVD, severe aortic insufficiency status post TAVR, chronic anemia, history of PE, depression.  Patient is in hospice/palliative care.  Recent admission for shortness of breath and leg swelling on 05/16/2020.  Admission diagnosis acute on chronic combined systolic and diastolic heart failure.  Admission BNP was elevated.  Chest x-ray  right pleural effusion.  Cardiology was consulted and he was placed on IV Lasix.  Echo showed decreased LV systolic function with EF of 20 to 25% and severe RV dysfunction.  Patient diuresed approximately 28 L of fluid.  He was discharged home on torsemide 40 mg daily with low-dose Imdur and Toprol-XL.  He was hyponatremic during hospital stay and given tolvaptan.  Sodium increased to 127.  ACE inhibitor and Aldactone were held.  He was to follow-up with cardiology as an outpatient and repeat sodium level.  He was referred to nephrology as an outpatient.   Past Medical History:  Diagnosis  Date  . Arterial embolus of lower extremity ([place]) 11/23/2013  . Arthritis   . CAD (coronary artery disease)    a. s/p PCI with 3.5 x 18 BMS to mid RCA, non obst LAD, Lcx patent 08/10/13  . Cancer ([place]) 2021   bladder  . Depression, major, single episode, mild ([place]) 12/20/2019  . ED (erectile dysfunction) 03/27/2015  . Embolus of femoral artery [facility]) 2012   Surgical repair, previously on Coumadin  . Essential hypertension   . ETOH abuse   . Hematuria 10/17/2019  . Hernia of abdominal cavity 02/27/2019  . History of DVT (deep vein thrombosis) 2011  . History of GI bleed    February 2012   . Ingrown left greater toenail 09/04/2018  . 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 Low oxygen saturation 01/31/2020  . 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 Pain with urination 10/17/2019  . Port-A-Cath in place 02/23/2020   right  . Prosthetic valve endocarditis ([place])   . PVD (peripheral vascular disease) ([place])   . S/P aortic valve replacement    Details not clear -  reportedly 1971 per patient    Past Surgical History:  Procedure Laterality Date  . ABDOMINAL AORTOGRAM W/LOWER EXTREMITY N/A 10/31/2018   Procedure: ABDOMINAL AORTOGRAM W/LOWER EXTREMITY;  Surgeon: Lorretta Harp, MD;  Location: [place] CV LAB;  Service: Cardiovascular;  Laterality: N/A;  . Aoric valve replacement  January 2005   56mm Medtronic Freestyle stentless porcine bioprosthesis (  [place])  . CARDIAC CATHETERIZATION     30-40% mid LAD, 20-30% ostial D1, proximal 30-40% RCA, 95% mid RCA thrombus, 40% distal stenosis. EF 45%, inferior HK.  [name] COLONOSCOPY N/A 04/20/2014   Procedure: COLONOSCOPY;  Surgeon: Rogene Houston, MD;  [facility];  Service: Endoscopy;  Laterality: N/A;  930  . COLONOSCOPY WITH PROPOFOL N/A 02/02/2020   Procedure: COLONOSCOPY WITH PROPOFOL;  Surgeon: Rogene Houston, MD;  [facility];  Service: Endoscopy;   Laterality: N/A;  . CORONARY ANGIOPLASTY WITH STENT PLACEMENT  07/2013  . ESOPHAGOGASTRODUODENOSCOPY (EGD) WITH PROPOFOL N/A 02/02/2020   Procedure: ESOPHAGOGASTRODUODENOSCOPY (EGD) WITH PROPOFOL;  Surgeon: Rogene Houston, MD;  [facility];  Service: Endoscopy;  Laterality: N/A;  . INGUINAL HERNIA REPAIR Bilateral 04/17/2019   Procedure: ATTEMPTED LAPAROSCOPIC BILATERAL INGUINAL HERNIA REPAIR;  Surgeon: Coralie Keens, MD;  Location: [place];  Service: General;  Laterality: Bilateral;  . INGUINAL HERNIA REPAIR Bilateral 04/17/2019   Procedure: Hernia Repair Inguinal Adult;  Surgeon: Coralie Keens, MD;  Location: [place];  Service: General;  Laterality: Bilateral;  . IR IMAGING GUIDED PORT INSERTION  02/22/2020  . IR NEPHROSTOMY EXCHANGE LEFT  04/05/2020  . IR NEPHROSTOMY EXCHANGE RIGHT  04/05/2020  . IR NEPHROSTOMY PLACEMENT LEFT  02/22/2020  . IR NEPHROSTOMY PLACEMENT RIGHT  02/22/2020  . LEFT HEART CATHETERIZATION WITH CORONARY ANGIOGRAM N/A 08/10/2013   Procedure: LEFT HEART CATHETERIZATION WITH CORONARY ANGIOGRAM;  Surgeon: Blane Ohara, MD;  Location: [facility] CATH LAB;  Service: Cardiovascular;  Laterality: N/A;  . Left leg embolectomy  May 2012   Dr. Trula Slade - reason for lifelong coumadin  . LYMPH NODE BIOPSY Left 11/21/2019   Procedure: Left Neck LYMPH NODE BIOPSY;  Surgeon: Rozetta Nunnery, MD;  Location: [place];  Service: ENT;  Laterality: Left;  . PERIPHERAL VASCULAR ATHERECTOMY  10/31/2018   Procedure: PERIPHERAL VASCULAR ATHERECTOMY;  Surgeon: Lorretta Harp, MD;  Location: [place] CV LAB;  Service: Cardiovascular;;  left SFA  . POLYPECTOMY  02/02/2020   Procedure: POLYPECTOMY;  Surgeon: Rogene Houston, MD;  [facility];  Service: Endoscopy;;  splenic flexure    Current Outpatient Medications  Medication Sig Dispense Refill  . acetaminophen (TYLENOL) 325 MG tablet Take 2 tablets (650 mg total) by mouth every 6 (six) hours as needed for mild pain or  moderate pain. (Patient taking differently: Take 325 mg by mouth every 6 (six) hours as needed for mild pain or moderate pain. ) 30 tablet 1  . Apixaban Starter Pack, 10mg  and 5mg , (ELIQUIS DVT/PE STARTER PACK) Take as directed on package: start with two-5mg  tablets twice daily for 7 days. On day 8, switch to one-5mg  tablet twice daily. (Patient taking differently: Take 5 mg by mouth in the morning and at bedtime. ) 1 each 0  . aspirin EC 81 MG tablet Take 1 tablet (81 mg total) by mouth daily. 90 tablet 3  . atorvastatin (LIPITOR) 40 MG tablet Take 1 tablet (40 mg total) by mouth daily. 90 tablet 1  . busPIRone (BUSPAR) 10 MG tablet Take 1 tablet (10 mg total) by mouth 2 (two) times daily. 60 tablet 2  . cholecalciferol (VITAMIN D) 1000 units tablet Take 1 tablet (1,000 Units total) by mouth daily. 90 tablet 3  . clonazePAM (KLONOPIN) 0.5 MG tablet Take 1 tablet (0.5 mg total) by mouth at bedtime. 10 tablet 0  . escitalopram (LEXAPRO) 10 MG tablet Take 1 tablet (10 mg total)  by mouth daily. 30 tablet 1  . fluticasone (FLONASE) 50 MCG/ACT nasal spray Use 2 spray(s) in each nostril once daily 16 g 0  . isosorbide mononitrate (IMDUR) 30 MG 24 hr tablet Take 0.5 tablets (15 mg total) by mouth daily. 15 tablet 0  . lidocaine-prilocaine (EMLA) cream Apply 1 application topically as needed.    . magnesium oxide (MAG-OX) 400 (241.3 Mg) MG tablet Take 1 tablet (400 mg total) by mouth 2 (two) times daily. 60 tablet 2  . metoprolol succinate (TOPROL-XL) 25 MG 24 hr tablet Take 3 tablets (75 mg total) by mouth daily. 90 tablet 0  . montelukast (SINGULAIR) 10 MG tablet Take 1 tablet (10 mg total) by mouth at bedtime. 90 tablet 0  . nitroGLYCERIN (NITROSTAT) 0.4 MG SL tablet Place 1 tablet (0.4 mg total) under the tongue every 5 (five) minutes as needed for chest pain. 25 tablet 0  . pantoprazole (PROTONIX) 40 MG tablet Take 1 tablet (40 mg total) by mouth daily. 30 tablet 0  . prochlorperazine (COMPAZINE) 10 MG  tablet Take 10 mg by mouth every 6 (six) hours as needed for nausea or vomiting.    . torsemide (DEMADEX) 20 MG tablet Take 2 tablets (40 mg total) by mouth daily. 120 tablet 0   No current facility-administered medications for this visit.   Allergies:  Patient has no known allergies.   Social History: The patient  reports that he quit smoking about 50 years ago. His smoking use included cigarettes. He started smoking about 54 years ago. He has a 1.50 pack-year smoking history. He has never used smokeless tobacco. He reports previous alcohol use. He reports current drug use. Drug: Marijuana.   Family History: The patient's family history includes COPD in his sister; Cirrhosis in his father; Diabetes in his mother.   ROS:  Please see the history of present illness. Otherwise, complete review of systems is positive for {NONE DEFAULTED:18576::"none"}.  All other systems are reviewed and negative.   Physical Exam: VS:  There were no vitals taken for this visit., BMI There is no height or weight on file to calculate BMI.  Wt Readings from Last 3 Encounters:  05/30/20 118 lb 13.3 oz (53.9 kg)  05/16/20 145 lb (65.8 kg)  05/09/20 145 lb 12.8 oz (66.1 kg)    General: Patient appears comfortable at rest. HEENT: Conjunctiva and lids normal, oropharynx clear with moist mucosa. Neck: Supple, no elevated JVP or carotid bruits, no thyromegaly. Lungs: Clear to auscultation, nonlabored breathing at rest. Cardiac: Regular rate and rhythm, no S3 or significant systolic murmur, no pericardial rub. Abdomen: Soft, nontender, no hepatomegaly, bowel sounds present, no guarding or rebound. Extremities: No pitting edema, distal pulses 2+. Skin: Warm and dry. Musculoskeletal: No kyphosis. Neuropsychiatric: Alert and oriented x3, affect grossly appropriate.  ECG:  {EKG/Telemetry Strips Reviewed:[id number]}  Recent Labwork: 04/13/2020: TSH 2.575 05/16/2020: B Natriuretic Peptide >4,500.0 05/27/2020: Magnesium  1.8 05/28/2020: ALT 19; AST 33; Hemoglobin 11.2; Platelets 142 05/29/2020: BUN 39; Creatinine, Ser 0.78; Potassium 3.8 05/30/2020: Sodium 127     Component Value Date/Time   CHOL 112 07/06/2019 0843   TRIG 52 07/06/2019 0843   HDL 42 07/06/2019 0843   CHOLHDL 2.7 07/06/2019 0843   VLDL 12 12/30/2016 0853   LDLCALC 57 07/06/2019 0843    Other Studies Reviewed Today:  Echocardiogram 04/23/2020 IMPRESSIONS  1. Left ventricular ejection fraction, by estimation, is 25 to 30%. The  left ventricle has severely decreased function. The left ventricle  demonstrates  global hypokinesis. The left ventricular internal cavity size  was moderately dilated. There is mild  left ventricular hypertrophy. Left ventricular diastolic parameters are  indeterminate.  2. Right ventricular systolic function is severely reduced. The right  ventricular size is mildly enlarged. There is moderately elevated  pulmonary artery systolic pressure. The estimated right ventricular  systolic pressure is 16.1 mmHg.  3. Left atrial size was severely dilated.  4. Right atrial size was severely dilated.  5. The mitral valve is abnormal. Mild to moderate mitral valve  regurgitation.  6. The aortic valve has been repaired/replaced. Aortic valve  regurgitation is trivial. There is a 26 mm Edwards Sapien prosthetic  (TAVR) valve present in the aortic position (patient has valve in valve  TAVR with previous bioprosthetic AVR). Aortic valve  mean gradient measures 8.0 mmHg. There is a moderate paravalvular leak at  9 o'clock position.  7. The inferior vena cava is dilated in size with <50% respiratory  variability, suggesting right atrial pressure of 15 mmHg.    Echocardiogram 01/05/2020: 1. Left ventricular ejection fraction, by estimation, is 45 to 50%. The  left ventricle has mildly decreased function. The left ventricle  demonstrates regional wall motion abnormalities (see scoring  diagram/findings for  description). The left ventricular  internal cavity size was mildly dilated. There is mild left ventricular  hypertrophy. Left ventricular diastolic parameters are consistent with  Grade I diastolic dysfunction (impaired relaxation).  2. Right ventricular systolic function is normal. The right ventricular  size is normal. There is mildly elevated pulmonary artery systolic  pressure. The estimated right ventricular systolic pressure is 09.6 mmHg.  3. Left atrial size was severely dilated.  4. The mitral valve is grossly normal. Trivial mitral valve  regurgitation.  5. The aortic valve has been repaired/replaced. History of bioprosthetic  AVR with valve in valve TAVR, 26 mm Sapien 3 prosthesis. There is a  moderate perivalvular leak at 9 o'clock. Aortic valve regurgitation is  mild. Aortic valve mean gradient measures  13.7 mmHg.  6. The inferior vena cava is normal in size with greater than 50%  respiratory variability, suggesting right atrial pressure of 3 mmHg.   Assessment and Plan:  1. Acute on chronic combined systolic (congestive) and diastolic (congestive) heart failure ([place])   2. Aortic valve disease   3. CAD in native artery   4. Hyponatremia    1. Acute on chronic combined systolic (congestive) and diastolic (congestive) heart failure (H[facility]) ***  2. Aortic valve disease ***  3. CAD in native artery ***  4. Hyponatremia ***  Medication Adjustments/Labs and Tests Ordered: Current medicines are reviewed at length with the patient today.  Concerns regarding medicines are outlined above.   Disposition: Follow-up with   Signed, Levell July, NP 06/13/2020 11:35 PM    [place] [facility] [place], [place], [place] [address] Phone: [phone]; Fax: [phone]

## 2020-06-13 NOTE — Telephone Encounter (Signed)
Glynis Authora care NP called saying that she evaluated pt for hospice services and he does qualify at this time but would need a prescription along with last office notes to be faxed to [facility]. Will you provide prescription. Any questions please call Glynis [phone]

## 2020-06-14 ENCOUNTER — Ambulatory Visit: Payer: Medicare Other | Admitting: Family Medicine

## 2020-06-16 ENCOUNTER — Telehealth: Payer: Self-pay | Admitting: Family Medicine

## 2020-06-17 ENCOUNTER — Other Ambulatory Visit: Payer: Self-pay | Admitting: *Deleted

## 2020-06-17 NOTE — Telephone Encounter (Signed)
Pt deceased

## 2020-06-17 NOTE — Patient Outreach (Signed)
[facility]) Care Management  06/17/2020  [name] Dec 23, 1950 [id number]   Notified that member is deceased.  Will close case.  [name], [place], MSN [place] [phone]

## 2020-06-18 ENCOUNTER — Ambulatory Visit (HOSPITAL_COMMUNITY): Payer: Medicare Other

## 2020-06-18 ENCOUNTER — Ambulatory Visit (HOSPITAL_COMMUNITY): Payer: Medicare Other | Admitting: Hematology

## 2020-06-18 ENCOUNTER — Other Ambulatory Visit (HOSPITAL_COMMUNITY): Payer: Medicare Other

## 2020-06-19 ENCOUNTER — Ambulatory Visit: Payer: Medicare Other | Admitting: Family Medicine

## 2020-06-19 ENCOUNTER — Ambulatory Visit: Payer: Self-pay | Admitting: *Deleted

## 2020-06-20 ENCOUNTER — Other Ambulatory Visit (HOSPITAL_COMMUNITY): Payer: Medicare Other

## 2020-06-21 NOTE — Telephone Encounter (Signed)
Call in from EMS stating pt found deceased in his home at 14:45 today, states that he appears to have passed the day before, no CPR performed, the family stated that they had last spoken to him yesterday.  I advised that I would sign the death  Certificate based on his clinical condition and medical history

## 2020-06-21 DEATH — deceased

## 2020-06-26 ENCOUNTER — Ambulatory Visit (HOSPITAL_COMMUNITY): Admit: 2020-06-26 | Payer: Medicare Other | Attending: Cardiovascular Disease | Admitting: Cardiovascular Disease

## 2020-06-27 ENCOUNTER — Ambulatory Visit: Payer: Medicare Other | Admitting: Family Medicine

## 2020-07-01 ENCOUNTER — Ambulatory Visit: Payer: Medicare Other | Admitting: Family Medicine

## 2020-07-03 ENCOUNTER — Ambulatory Visit: Payer: Medicare Other | Admitting: Urology

## 2020-07-12 ENCOUNTER — Ambulatory Visit: Payer: Medicare Other | Admitting: Cardiology

## 2021-01-03 IMAGING — CT CT ANGIO CHEST
2 of 6 series · 17 of 46 positions shown · IV contrast (Omnipaque or Isovue)
Comparison: CT 02/02/2020

CLINICAL DATA: Increasing shortness of breath, history of bladder
cancer, DVT

EXAM:
CT ANGIOGRAPHY CHEST WITH CONTRAST
TECHNIQUE: Multidetector CT imaging of the chest was performed using the
standard protocol during bolus administration of intravenous
contrast. Multiplanar CT image reconstructions and MIPs were
obtained to evaluate the vascular anatomy.
CONTRAST:  75mL OMNIPAQUE IOHEXOL 350 MG/ML SOLN

[Series 5: pe axial thins · axial · 0.79mm/px · z∈[+1397,+1672]mm · 14 of 303 slices shown]
[im 14/303  lung]
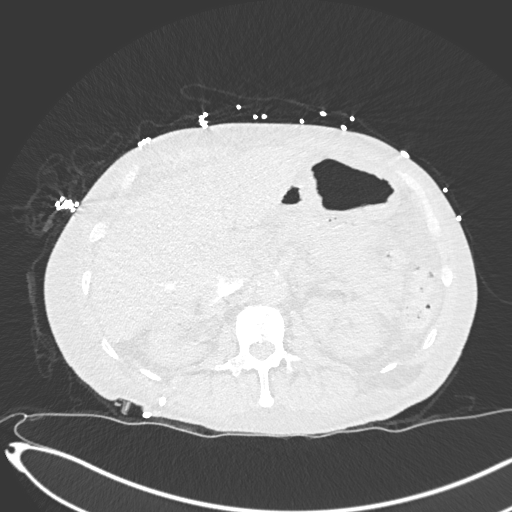
[im 40/303  soft-tissue]
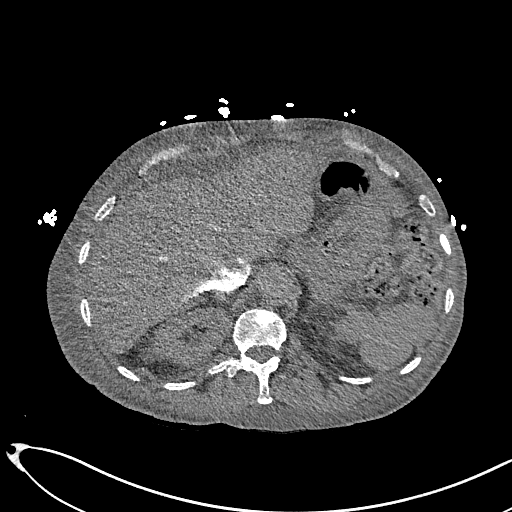
[im 53/303  lung]
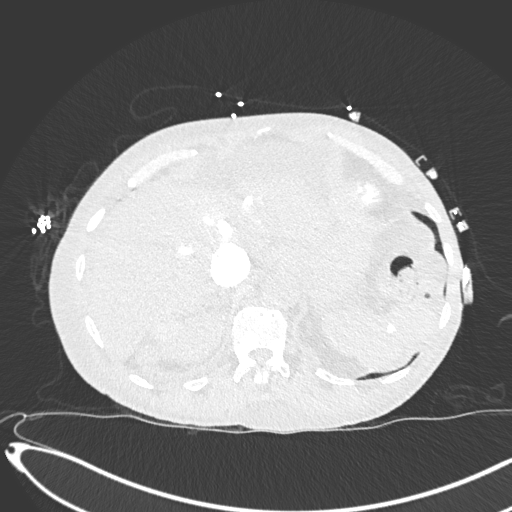
[im 79/303  soft-tissue]
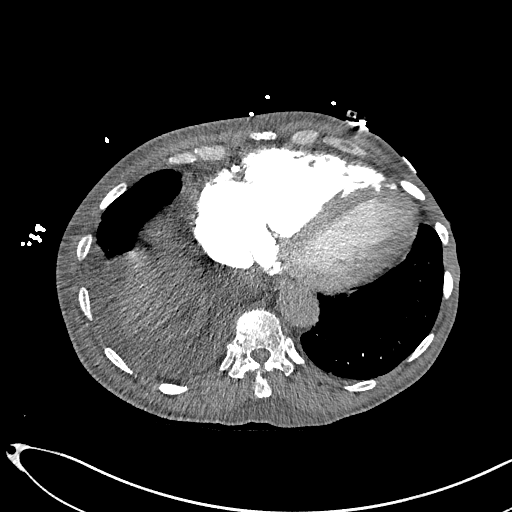
[im 106/303  lung]
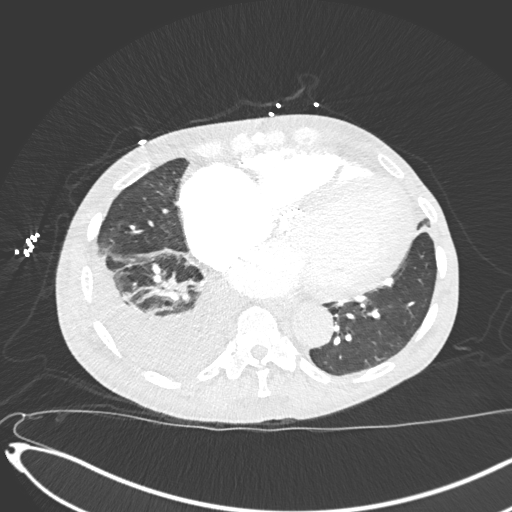
[im 119/303  soft-tissue]
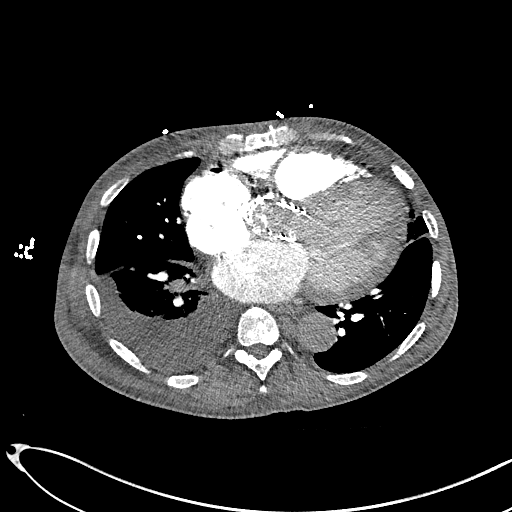
[im 145/303  lung]
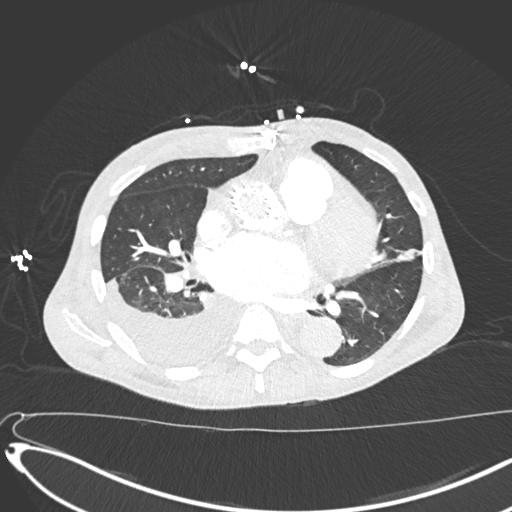
[im 158/303  soft-tissue]
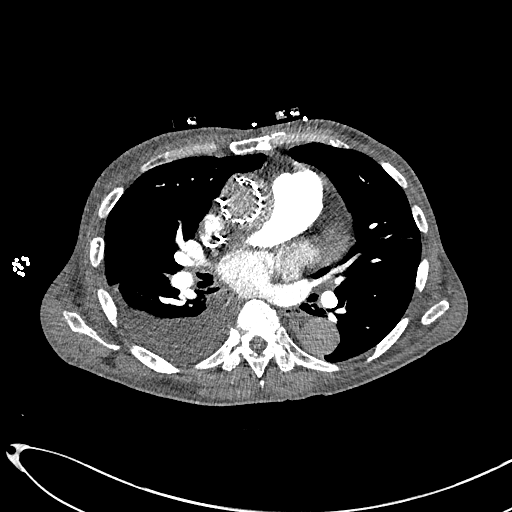
[im 184/303  lung]
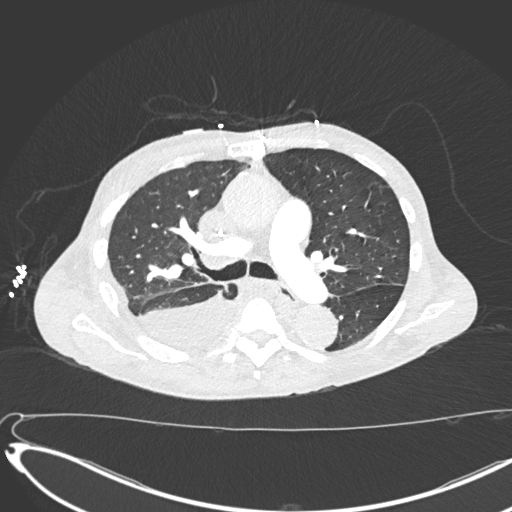
[im 197/303  soft-tissue]
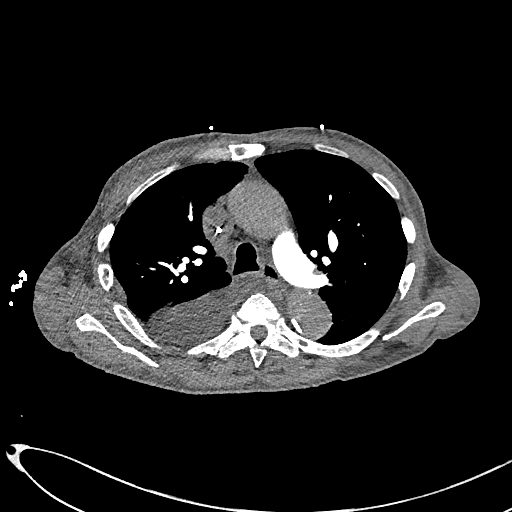
[im 224/303  lung]
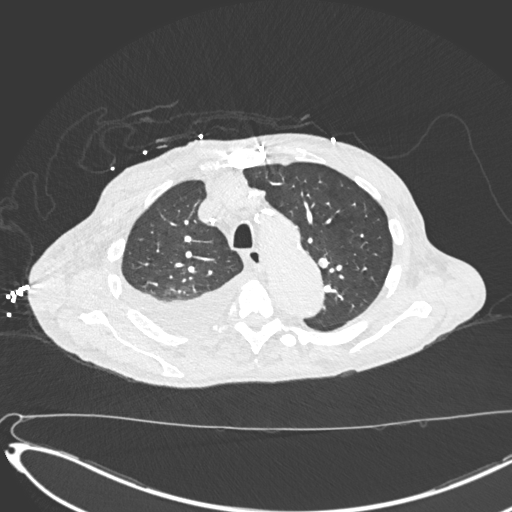
[im 250/303  soft-tissue]
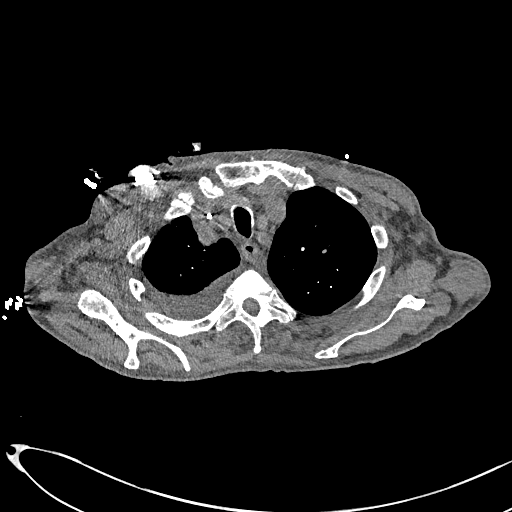
[im 263/303  lung]
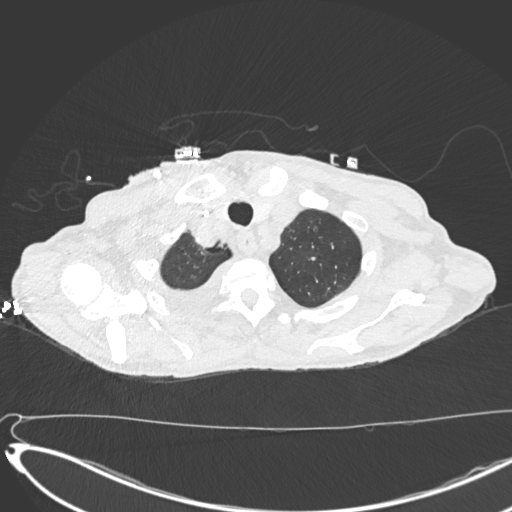
[im 289/303  soft-tissue]
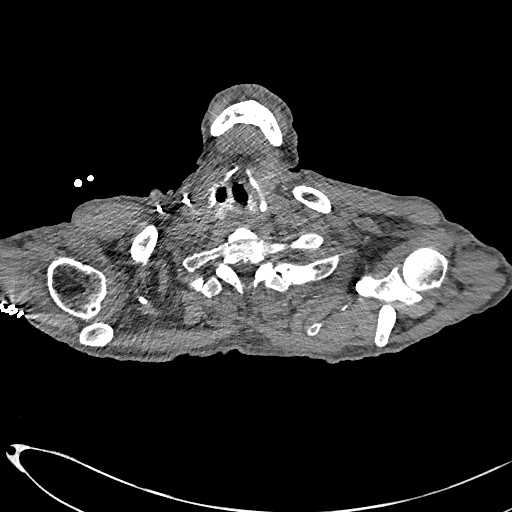

[Series 7: cor soft · coronal · 0.62mm/px · 3 of 134 slices shown]
[im 34/134  soft-tissue]
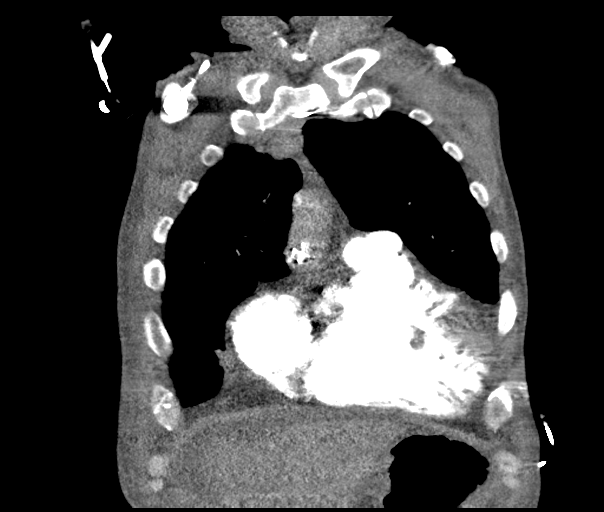
[im 67/134  soft-tissue]
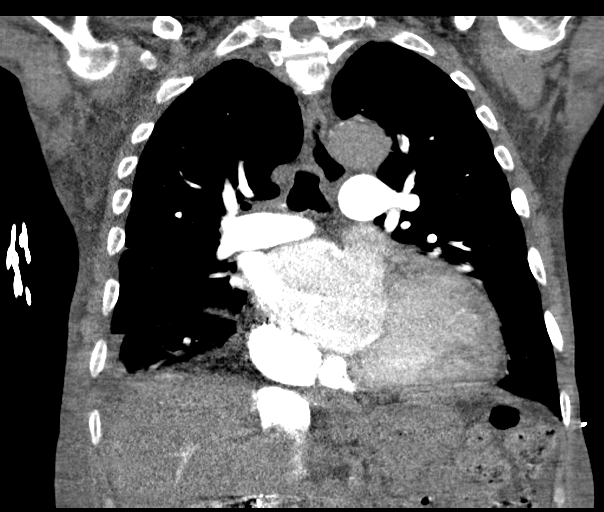
[im 100/134  soft-tissue]
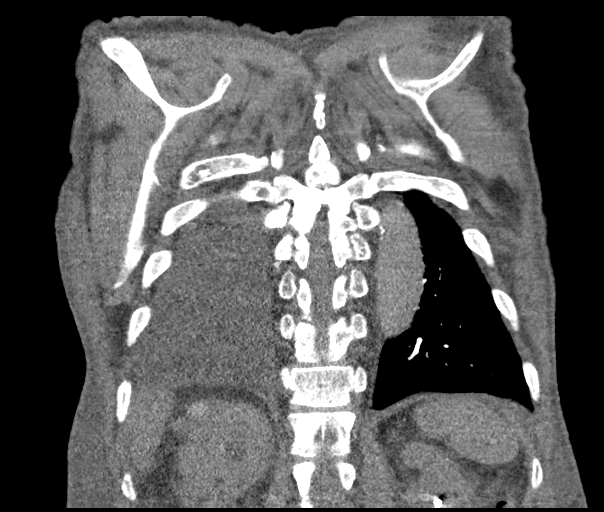

[17 of 46 positions shown; findings below may reference images not displayed]

FINDINGS: Cardiovascular: Satisfactory opacification of the pulmonary arteries
to the segmental level. Suspect filling defects within the segmental
branches of the posterior basal segment right lower lobe (5/190)
though difficult to fully assess given hypoventilatory
changes/passive atelectasis in this region as well as some
respiratory motion artifact. No other convincing pulmonary arterial
filling defects additional peripherally marginated segmental filling
defects seen in the anterior segmental branches left upper lobe
(5/79) and lateral basal segmental branch of the left lower lobe
(5/172). Central pulmonary arteries remain normal caliber. Diffuse
cardiomegaly with biatrial enlargement and reflux of contrast into
the IVC and hepatic veins. Transcatheter aortic valve replacement is
in position. Coronary artery calcifications are present. Rib right
IJ Port-A-Cath tip terminates at the level of the superior
cavoatrial junction. No other major venous abnormalities.

Mediastinum/Nodes: Postsurgical changes from prior sternotomy. No
mediastinal fluid or gas. Normal thyroid gland and thoracic inlet.
No acute abnormality of the trachea or esophagus. No worrisome
mediastinal, hilar or axillary adenopathy.

Lungs/Pleura: Moderate right pleural effusion with subtotal
atelectatic collapse of the right lower lobe with further
subsegmental atelectatic changes in the right upper and middle lobes
as well. Stable biapical pleuroparenchymal scarring and blebs with
calcified right apical granuloma.

Upper Abdomen: Coil pack seen in the central liver. Bilateral
percutaneous nephrostomies. Small volume upper abdominal ascites.

Musculoskeletal: Diffuse body wall edema. New extensive multifocal
areas of sclerotic osseous metastatic disease throughout the axial
skeleton, sternum and ribs as well as the bilateral scapula and
right humerus. No acute fracture. Postsurgical changes from prior
sternotomy with intact sternal sutures and bony fusion across the
sternum. No worrisome chest wall lesions.

Review of the MIP images confirms the above findings.
IMPRESSION: 1. Segmental and subsegmental filling defects in the posterior basal
segment right lower lobe, anterior segment left upper lobe and basal
segmental left lower lobe.
2. Cardiomegaly with biatrial enlargement and reflux of contrast
into the IVC and hepatic veins, suggestive of elevated right heart
pressure/right heart failure.
3. Moderate right pleural effusion with subtotal atelectatic
collapse of the right lower lobe with further subsegmental
atelectatic changes in the right upper and middle lobes as well.
Possibly related to volume status though malignant effusion is not
excluded.
4. Additional features of anasarca with circumferential body wall
edema and upper abdominal ascites.
5. Extensive new multifocal areas of sclerotic osseous metastatic
disease.
6. Prior transcatheter aortic valve replacement as well as
sternotomy.
7. Coronary artery atherosclerosis.
8. Bilateral percutaneous nephrostomies.
9. Aortic Atherosclerosis ([id number]).

## 2021-01-03 IMAGING — DX DG CHEST 1V PORT
1 series · 1 of 1 positions shown · non-contrast
Comparison: Radiograph 04/12/2020.  CT 02/02/2020

CLINICAL DATA: Increasing shortness of breath since last night.

EXAM:
PORTABLE CHEST 1 VIEW

[chest ap]
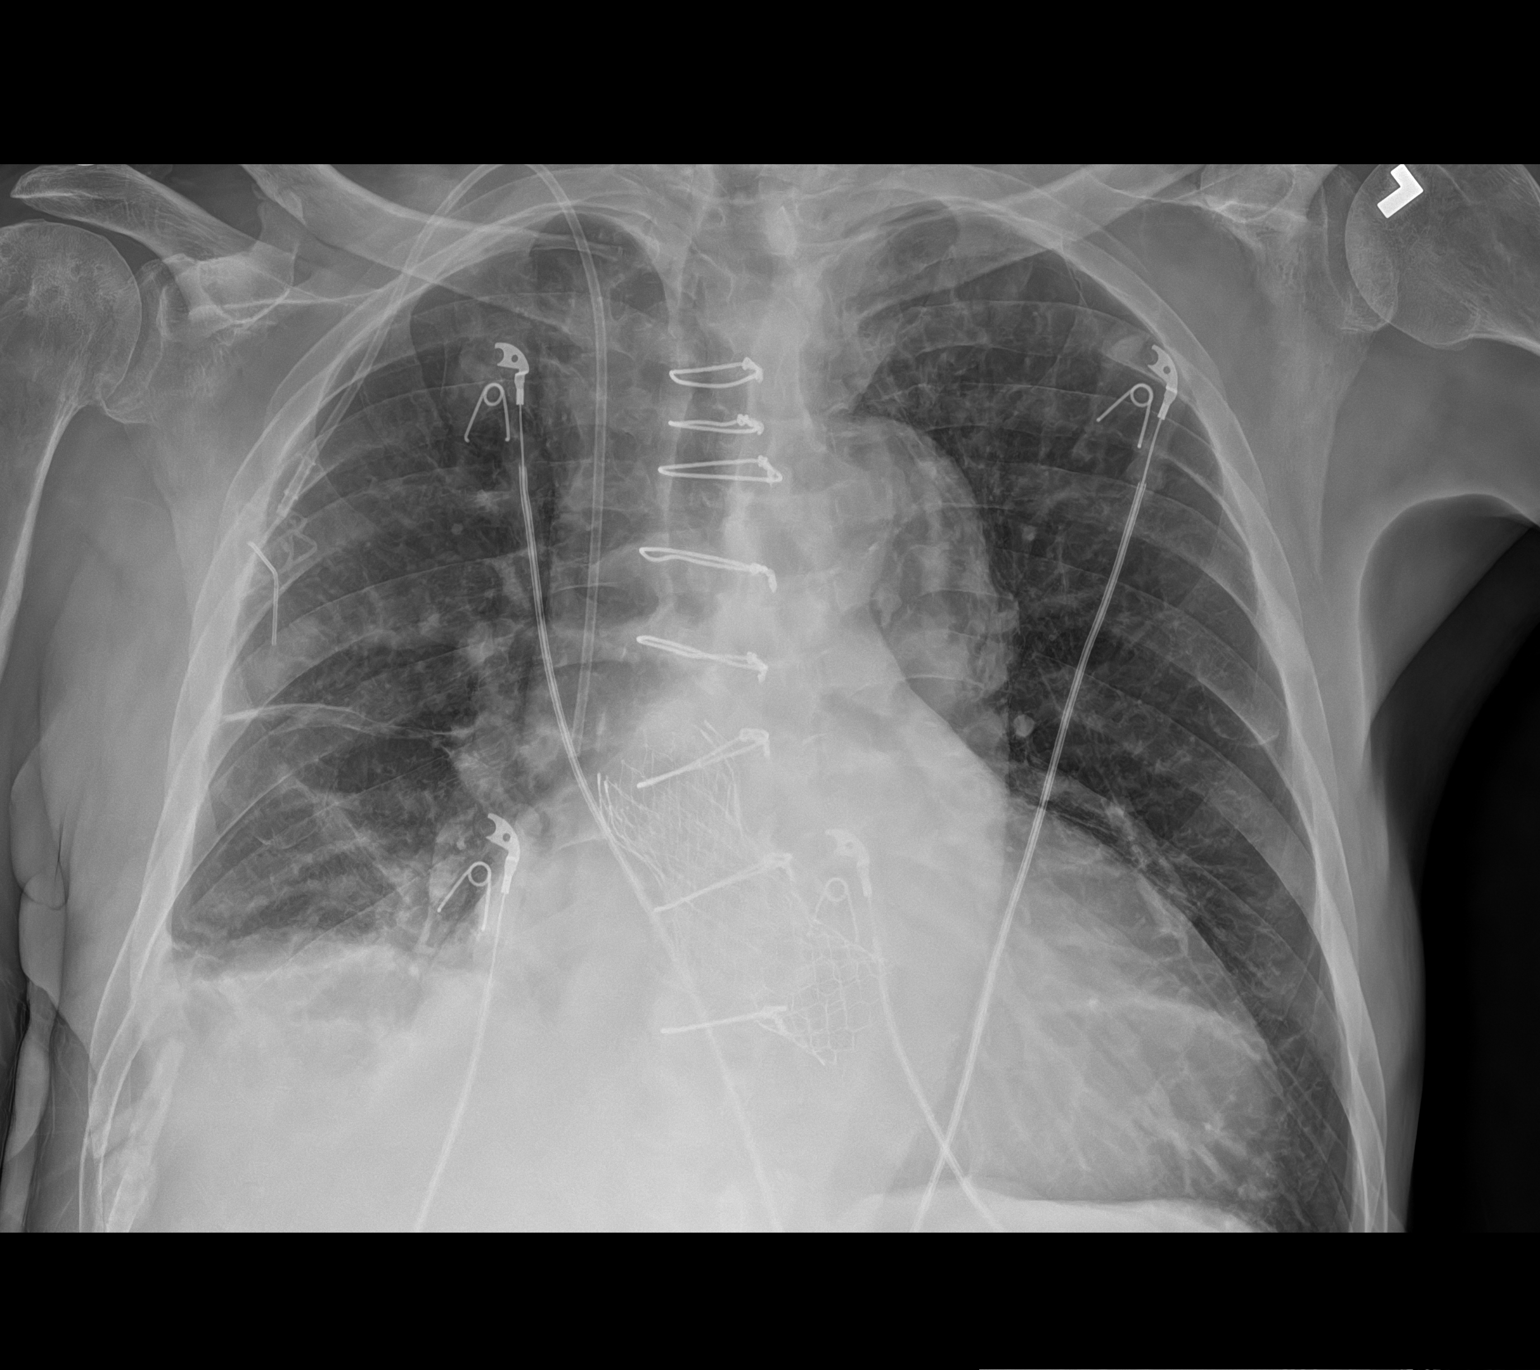

[1 of 1 positions shown; findings below may reference images not displayed]

FINDINGS: Accessed right chest port in place. Post median sternotomy. Stable
cardiomegaly and prosthetic aortic valve/root. There is new volume
loss at the right lung base with ill-defined opacity. Minimal fluid
in the right minor fissure versus adjacent atelectasis. The left
lung is clear. There is no pulmonary edema. No pneumothorax.
Blunting of the right costophrenic angle may be chronic or small
effusion.
IMPRESSION: 1. New volume loss at the right lung base with ill-defined opacity.
This may represent atelectasis, pneumonia or potentially aspiration
in the appropriate clinical setting. Possible small right pleural
effusion.
2. Stable cardiomegaly.

## 2021-01-28 IMAGING — DX DG CHEST 1V PORT
2 series · 2 of 2 positions shown · non-contrast
Comparison: April 21, 2020.

CLINICAL DATA: Shortness of breath.

EXAM:
PORTABLE CHEST 1 VIEW

[chest ap (1 of 2)]
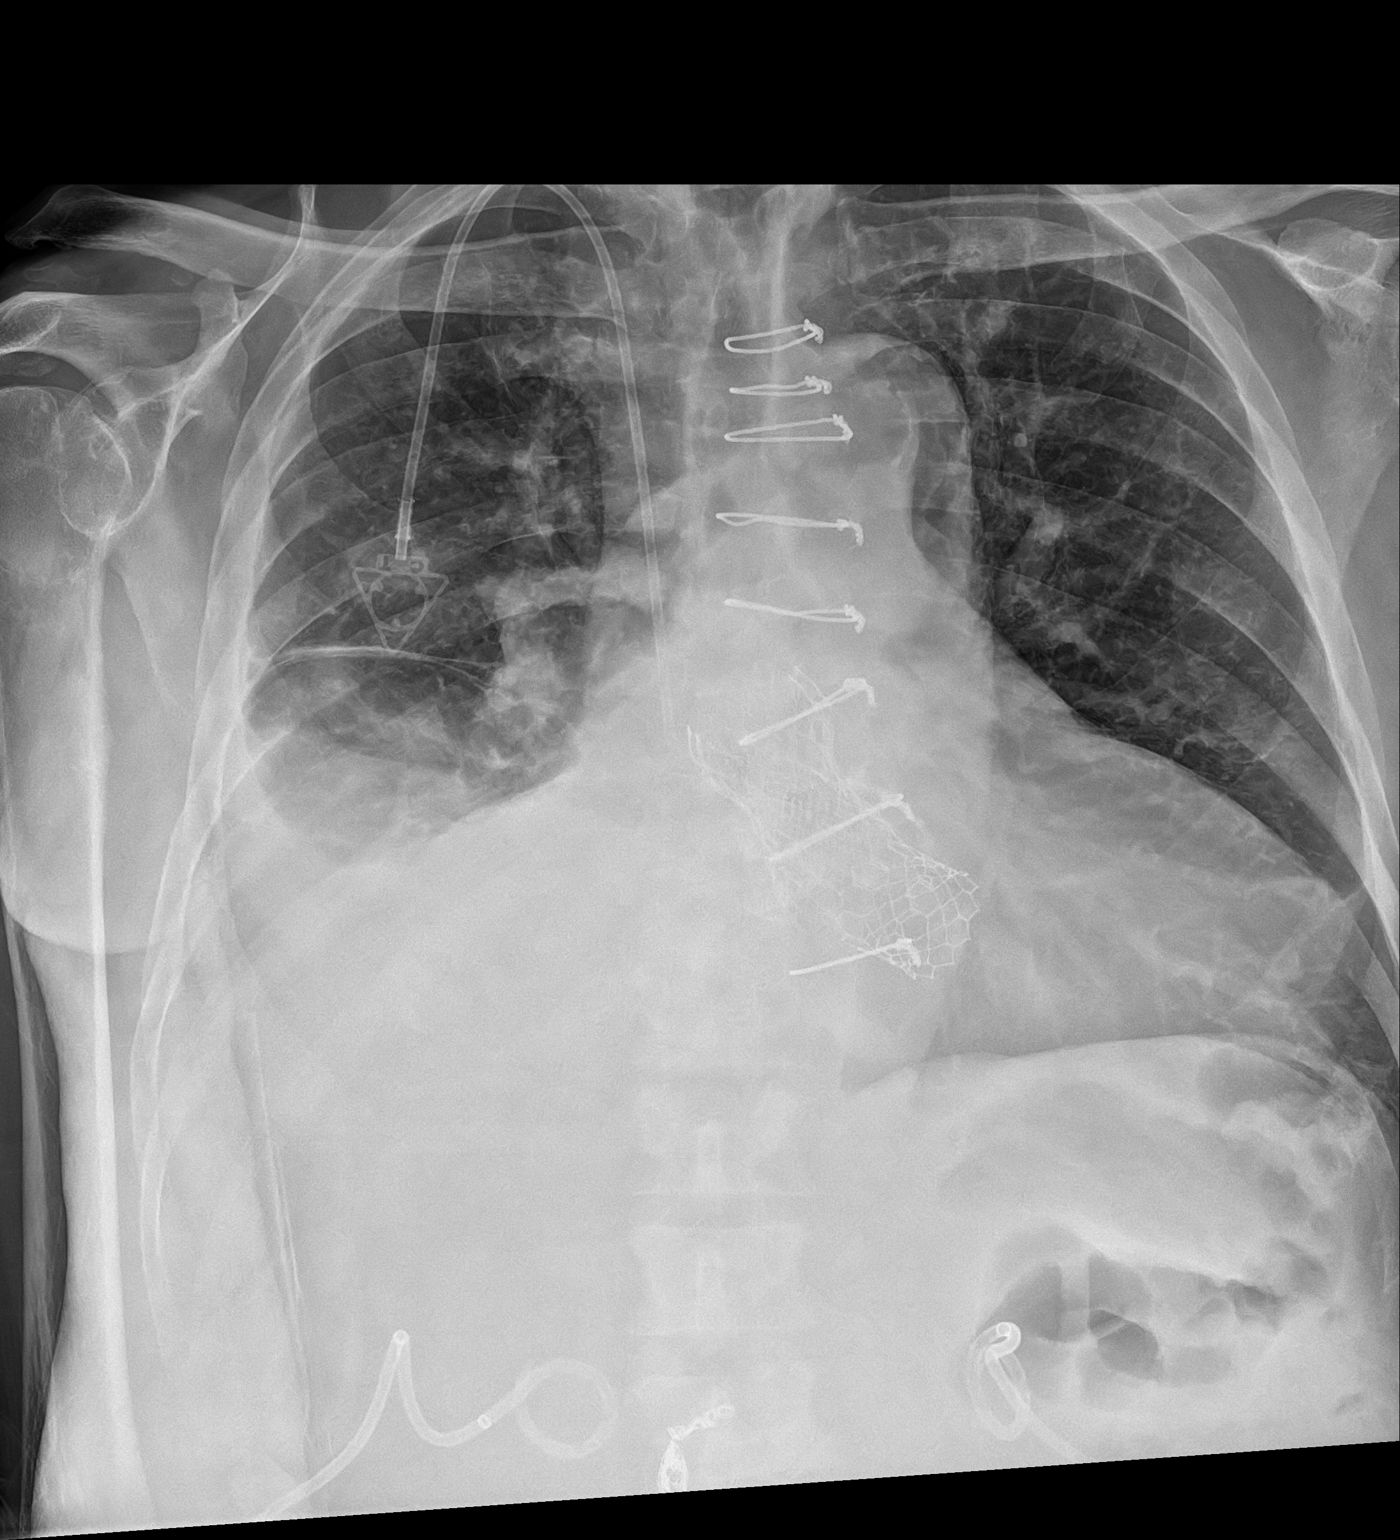

[chest ap (2 of 2)]
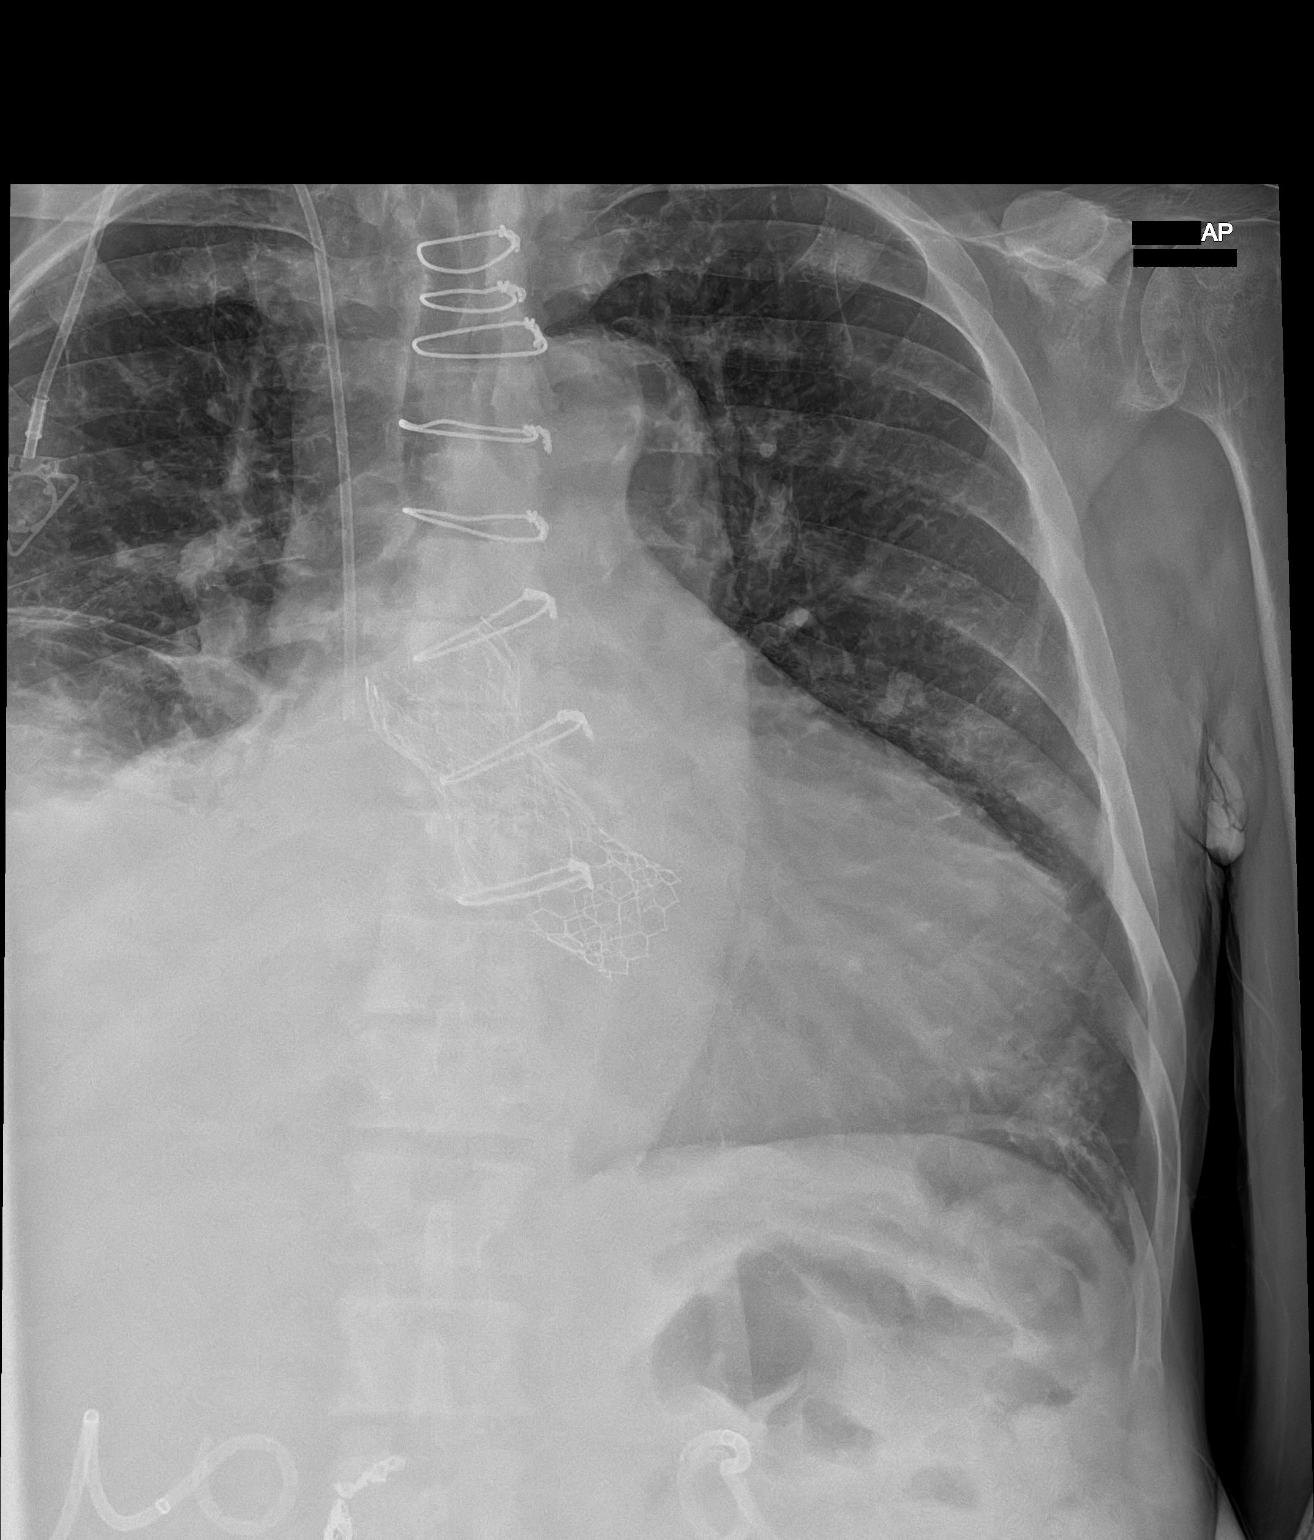

[2 of 2 positions shown; findings below may reference images not displayed]

FINDINGS: Stable cardiomegaly. No pneumothorax is noted. Status post aortic
valve repair. Right internal jugular Port-A-Cath is unchanged in
position. Left lung is clear. Mild right pleural effusion is noted
with associated right basilar atelectasis or infiltrate. Bony thorax
is unremarkable.
IMPRESSION: Mild right pleural effusion is noted with associated right basilar
atelectasis or infiltrate.

## 2024-06-19 NOTE — Telephone Encounter (Signed)
 error

## 2024-06-20 NOTE — Telephone Encounter (Signed)
 error
